# Patient Record
Sex: Male | Born: 1965 | Race: White | Hispanic: No | Marital: Single | Smoking: Current every day smoker
Health system: Southern US, Community
[De-identification: ages and names within clinical notes are randomized; demographics above are authoritative.]

## PROBLEM LIST (undated history)

## (undated) DIAGNOSIS — N209 Urinary calculus, unspecified: Secondary | ICD-10-CM

## (undated) DIAGNOSIS — R053 Chronic cough: Secondary | ICD-10-CM

## (undated) HISTORY — PX: MANDIBLE FRACTURE SURGERY: SHX706

## (undated) HISTORY — PX: HERNIA REPAIR: SHX51

---

## 2004-08-19 ENCOUNTER — Other Ambulatory Visit: Payer: Self-pay

## 2005-06-27 ENCOUNTER — Emergency Department: Payer: Self-pay | Admitting: Emergency Medicine

## 2006-02-14 ENCOUNTER — Emergency Department: Payer: Self-pay | Admitting: Emergency Medicine

## 2006-02-14 ENCOUNTER — Other Ambulatory Visit: Payer: Self-pay

## 2006-08-08 ENCOUNTER — Emergency Department: Payer: Self-pay | Admitting: Emergency Medicine

## 2009-03-03 ENCOUNTER — Emergency Department: Payer: Self-pay | Admitting: Unknown Physician Specialty

## 2009-07-26 ENCOUNTER — Emergency Department: Payer: Self-pay | Admitting: Emergency Medicine

## 2010-09-03 ENCOUNTER — Emergency Department: Payer: Self-pay | Admitting: Emergency Medicine

## 2011-02-03 ENCOUNTER — Emergency Department: Payer: Self-pay | Admitting: Emergency Medicine

## 2011-02-18 ENCOUNTER — Emergency Department: Payer: Self-pay | Admitting: Unknown Physician Specialty

## 2011-03-19 ENCOUNTER — Emergency Department: Payer: Self-pay | Admitting: Internal Medicine

## 2011-03-20 ENCOUNTER — Emergency Department: Payer: Self-pay | Admitting: Unknown Physician Specialty

## 2011-03-29 ENCOUNTER — Inpatient Hospital Stay: Payer: Self-pay | Admitting: Internal Medicine

## 2011-04-08 ENCOUNTER — Emergency Department: Payer: Self-pay | Admitting: Emergency Medicine

## 2011-05-15 ENCOUNTER — Emergency Department: Payer: Self-pay | Admitting: Emergency Medicine

## 2011-05-27 ENCOUNTER — Emergency Department: Payer: Self-pay | Admitting: Emergency Medicine

## 2011-07-25 ENCOUNTER — Emergency Department: Payer: Self-pay | Admitting: Emergency Medicine

## 2011-08-29 ENCOUNTER — Emergency Department: Payer: Self-pay | Admitting: Emergency Medicine

## 2011-10-16 ENCOUNTER — Emergency Department: Payer: Self-pay | Admitting: Emergency Medicine

## 2011-11-27 ENCOUNTER — Emergency Department: Payer: Self-pay | Admitting: Emergency Medicine

## 2012-05-19 IMAGING — CT CT CHEST-ABD-PELV W/ CM
1 of 2 series · 14 of 32 positions shown, 19 images · non-contrast
Comparison: None

REASON FOR EXAM: (1) left chest pain; (2) chest trauma eval for abd
injury  IV  contrast onlyetc
COMMENTS:

PROCEDURE:     CT  - CT CHEST ABDOMEN AND PELVIS W  - March 29, 2011  [DATE]
RESULT:     CT CHEST, ABDOMEN, AND PELVIS
HISTORY: Left chest pain
TECHNIQUE: Multiple axial images obtained from the thoracic inlet to the
pubic symphysis, without p.o. contrast and with 100 ml of Dsovue-JB2
intravenous contrast.

[Series 2: chest, a&p soft tissue · axial · 0.74mm/px · z∈[+691,+1255]mm · 14 of 208 slices shown, 19 images]
[im 10/208  soft-tissue]
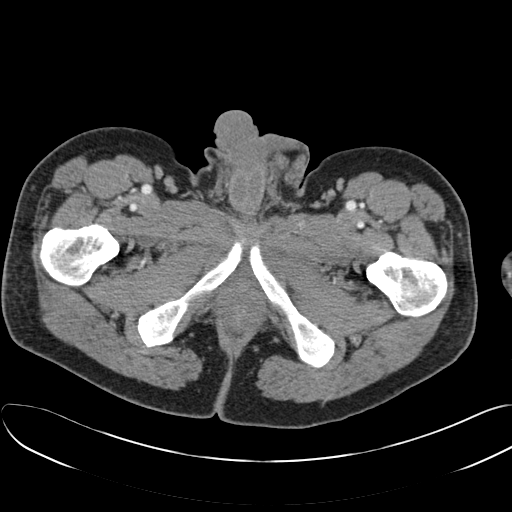
[im 10/208  bone]
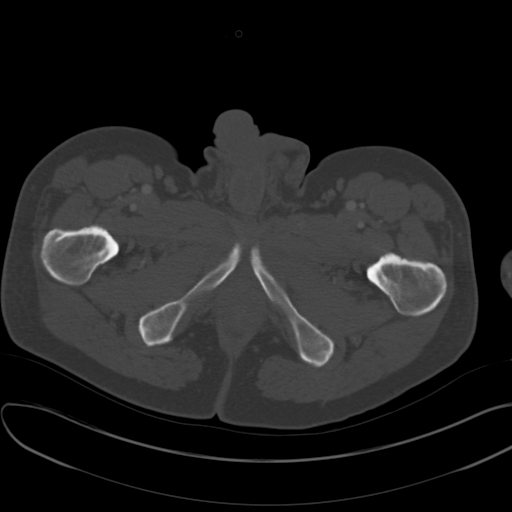
[im 30/208  soft-tissue]
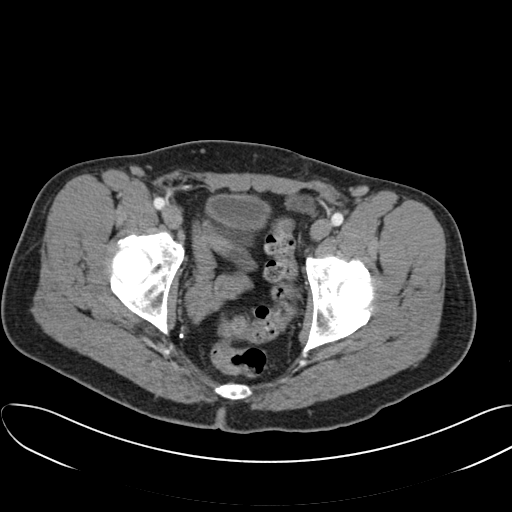
[im 40/208  soft-tissue]
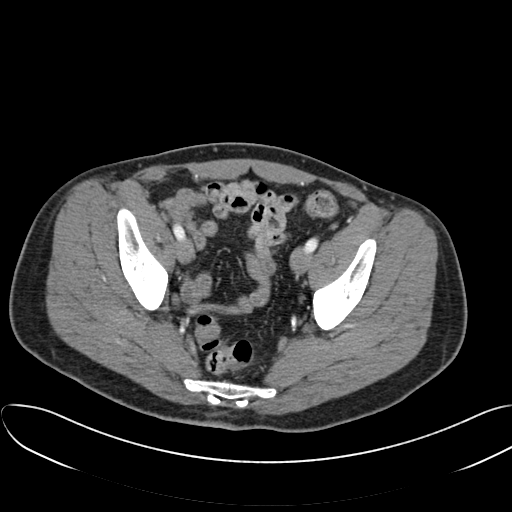
[im 60/208  soft-tissue]
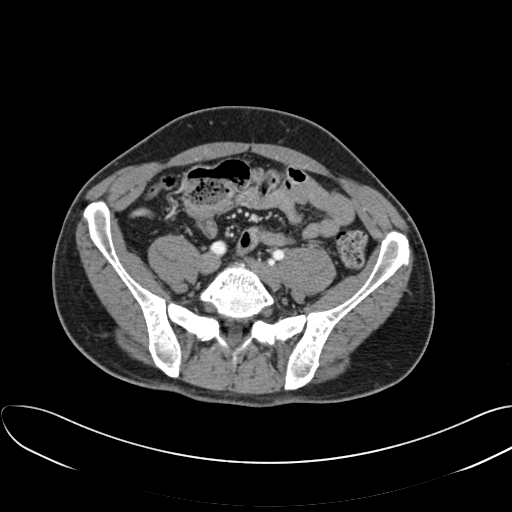
[im 70/208  soft-tissue]
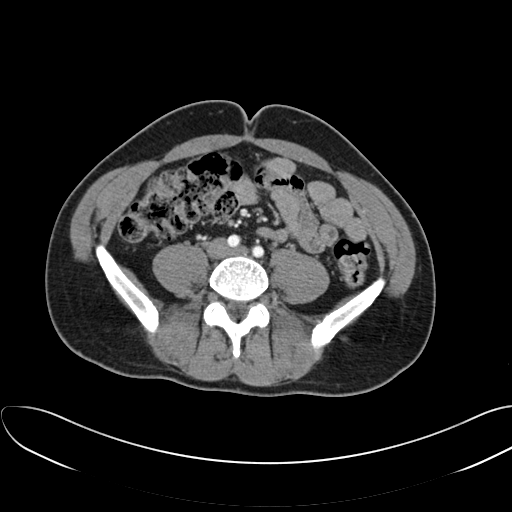
[im 89/208  soft-tissue]
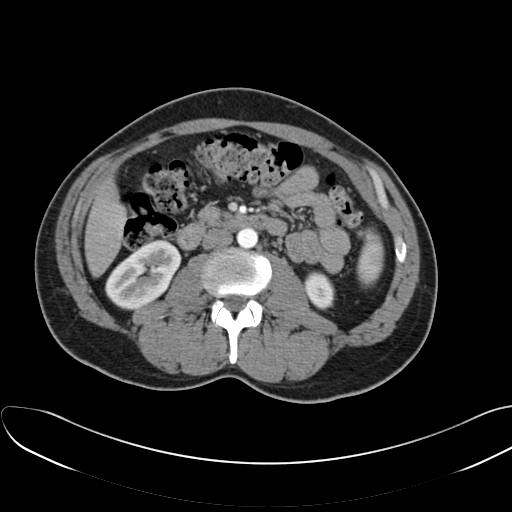
[im 109/208  soft-tissue]
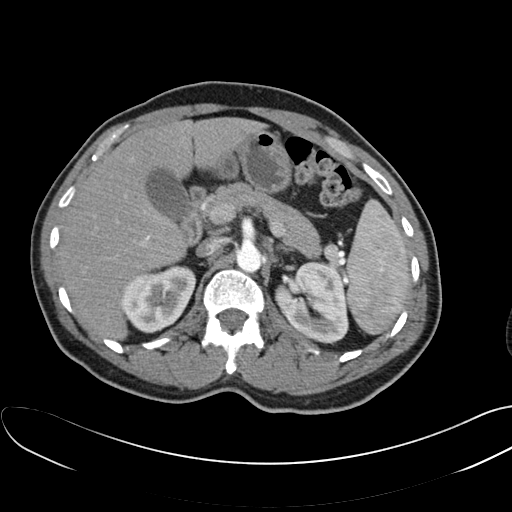
[im 119/208  soft-tissue]
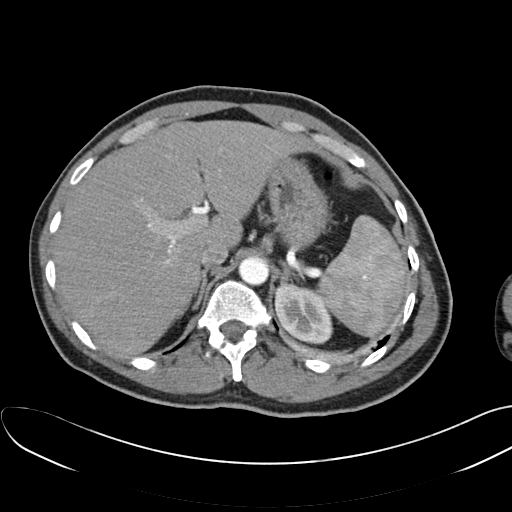
[im 139/208  soft-tissue]
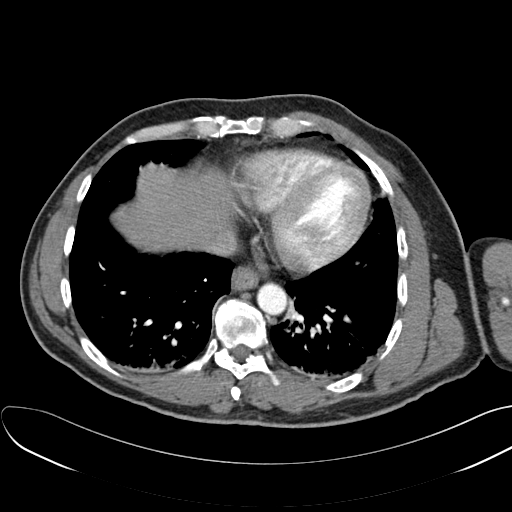
[im 139/208  bone]
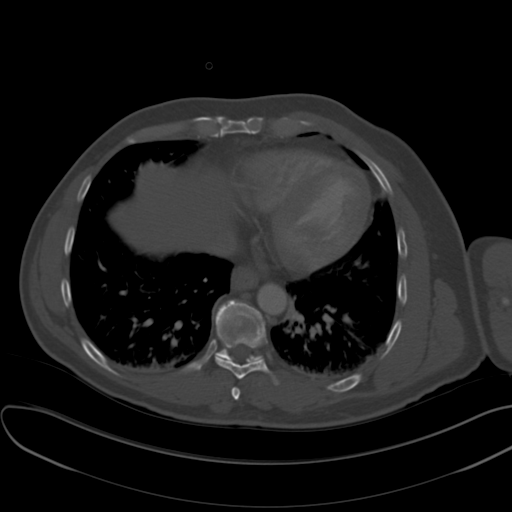
[im 148/208  soft-tissue]
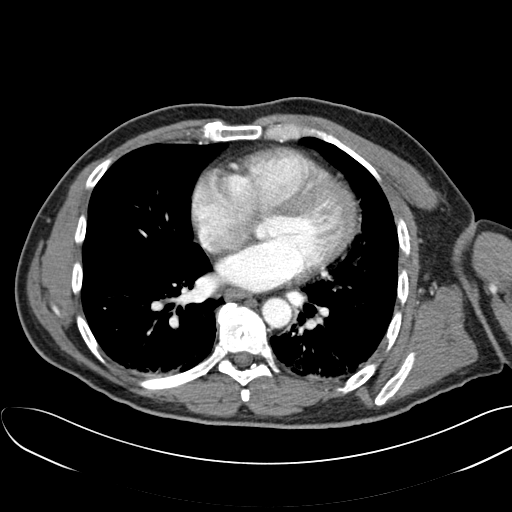
[im 168/208  soft-tissue]
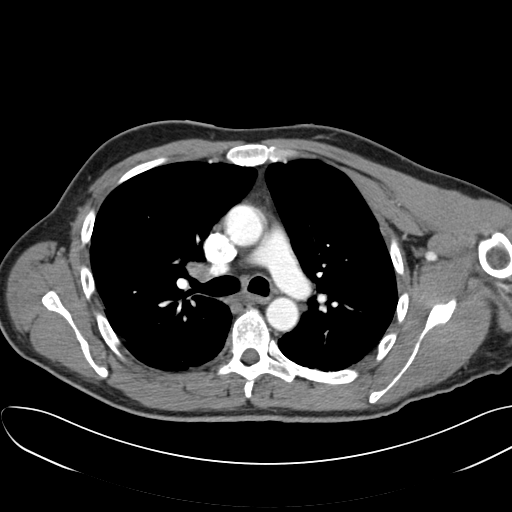
[im 168/208  lung]
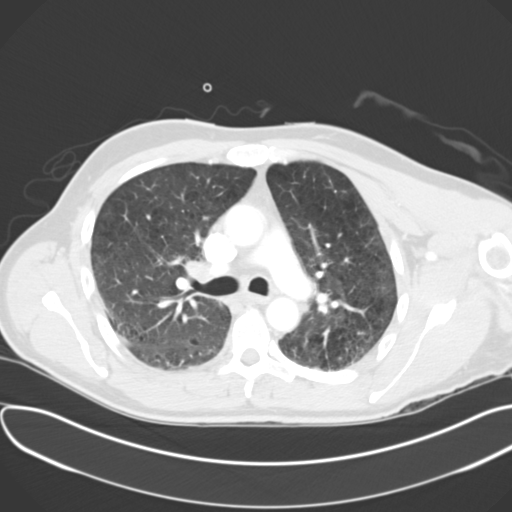
[im 178/208  soft-tissue]
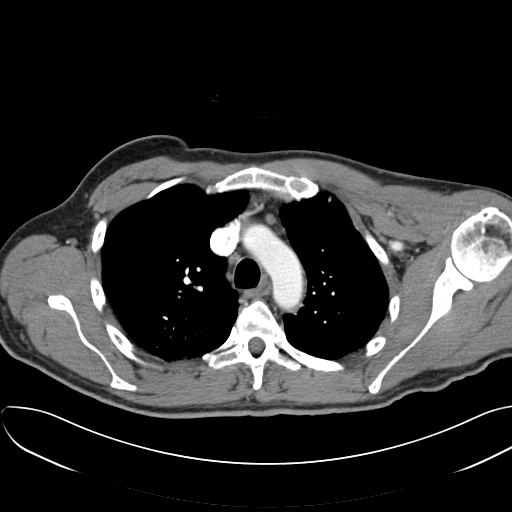
[im 178/208  lung]
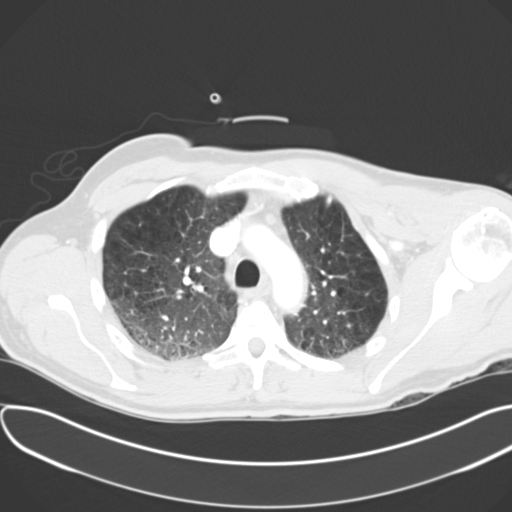
[im 188/208  lung]
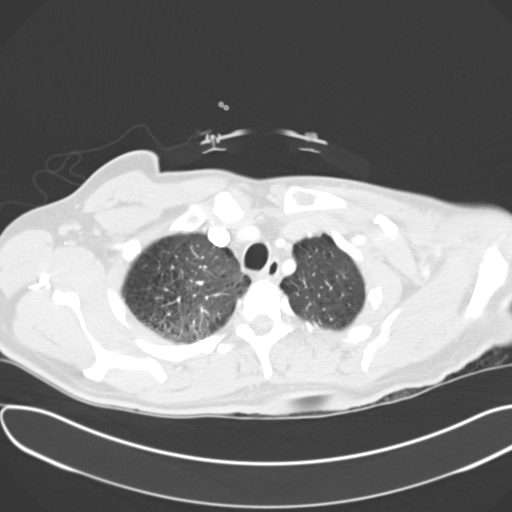
[im 198/208  soft-tissue]
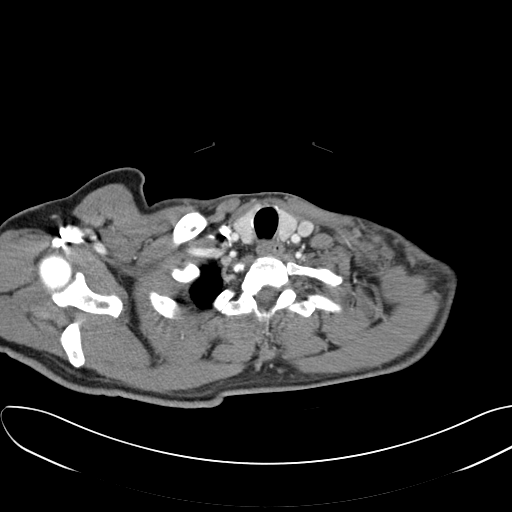
[im 198/208  lung]
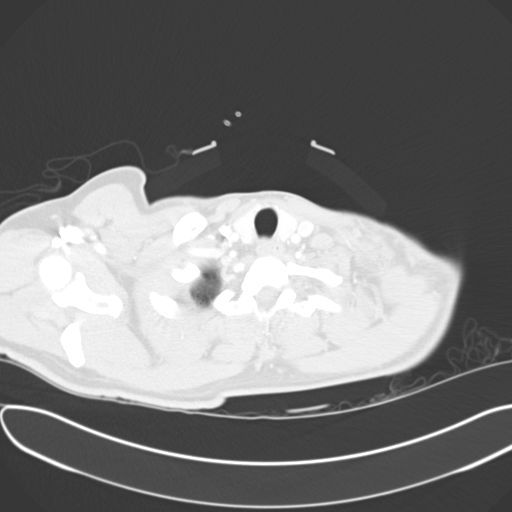

[14 of 32 positions shown; findings below may reference images not displayed]

FINDINGS: CHEST:

There are bilateral emphysematous changes. There is a small left apical
pneumothorax measuring less than 5 present. There is bibasilar atelectasis.
There is no focal parenchymal opacity, pleural effusion, or pneumothorax.

The heart size is normal. There is no pericardial effusion.

There are no pathologically enlarged mediastinal, hilar, or axillary lymph
nodes.

There is a comminuted fracture of the distal left clavicle. There is a
nondisplaced fracture of the lateral left sixth rib.

ABDOMEN/PELVIS:

The liver demonstrates no focal abnormality. There is no intrahepatic or
extrahepatic biliary ductal dilatation. The gallbladder is unremarkable. The
spleen demonstrates no focal abnormality. There is a nonobstructing right
renal calculus. The left kidney, adrenal glands, pancreas are normal. The
bladder is unremarkable.

The opacified stomach, duodenum, small intestine, and large intestine
demonstrate no gross abnormality, but evaluation is limited secondary to
lack of enteric contrast. There is no pneumoperitoneum, pneumatosis, or
portal venous gas. There is no abdominal or pelvic free fluid. There is no
lymphadenopathy.

The abdominal aorta is normal in caliber with atherosclerosis.

The osseous structures are unremarkable.
IMPRESSION: 1. Trace left apical pneumothorax.

2. There is a comminuted fracture of the distal left clavicle.

3. There is a nondisplaced fracture of the lateral left sixth rib.

## 2012-05-19 IMAGING — CT CT CERVICAL SPINE WITHOUT CONTRAST
3 series · 16 of 33 positions shown, 19 images · non-contrast
Comparison: None

REASON FOR EXAM: neck pain
COMMENTS:

PROCEDURE:     CT  - CT CERVICAL SPINE WO  - March 29, 2011 [DATE]
RESULT:     Clinical Indication: Trauma
TECHNIQUE: Multiple axial CT images from the skull base to the mid vertebral
body of T1. obtained with sagittal and coronal reformatted images provided.

[Series 3: axial · axial · 0.33mm/px · z∈[-304,-156]mm · 8 of 89 slices shown, 10 images]
[im 7/89  soft-tissue]
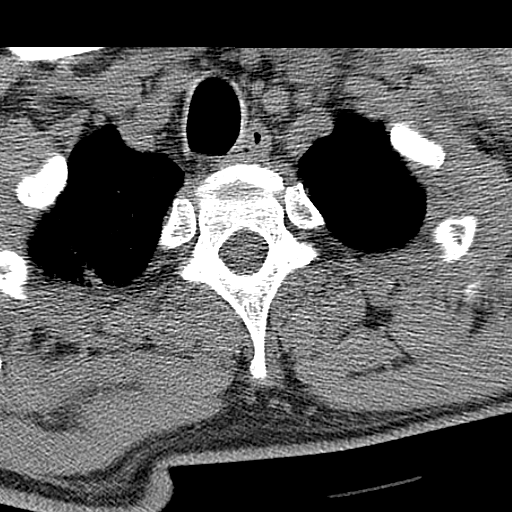
[im 7/89  bone]
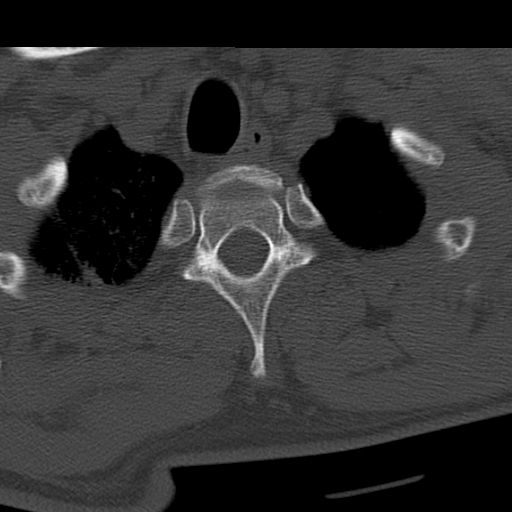
[im 21/89  bone]
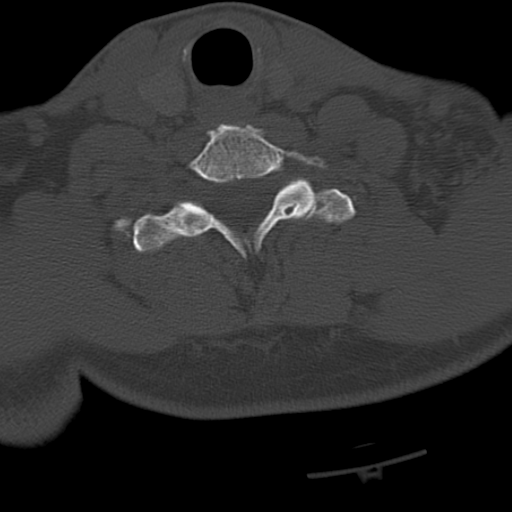
[im 28/89  bone]
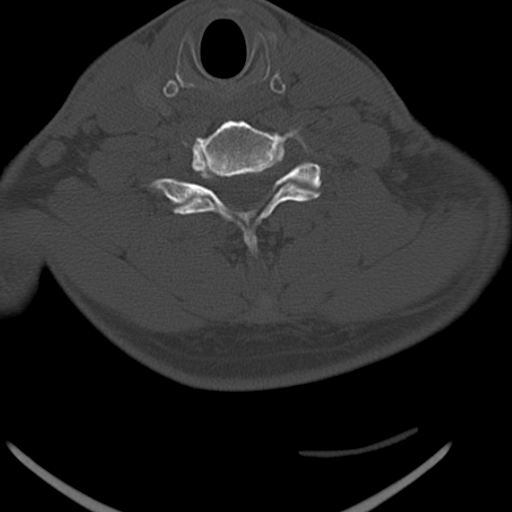
[im 41/89  bone]
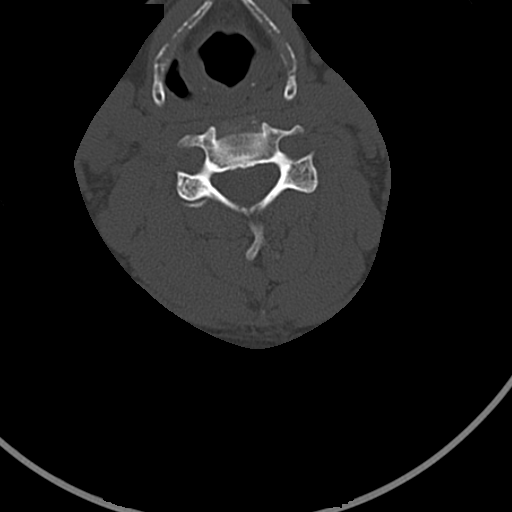
[im 48/89  soft-tissue]
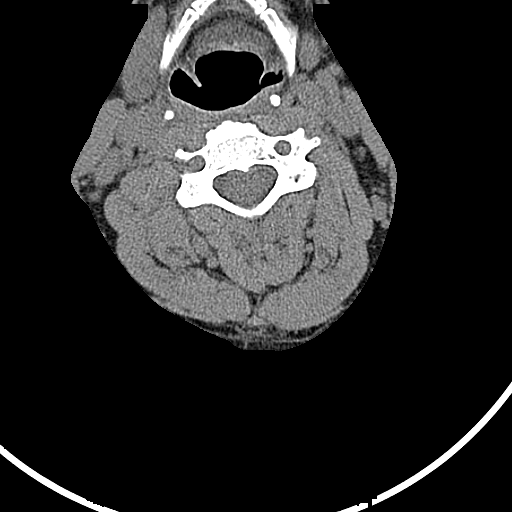
[im 48/89  bone]
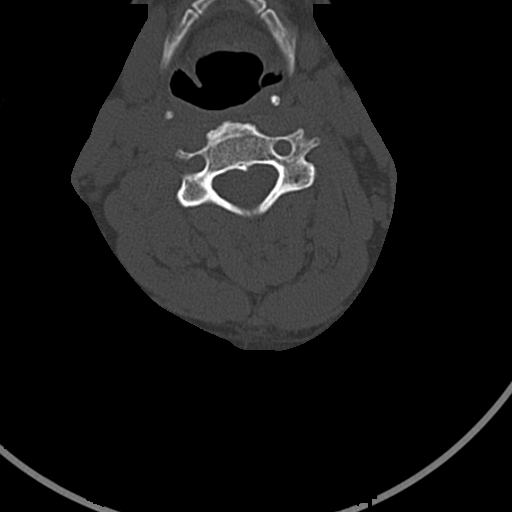
[im 61/89  bone]
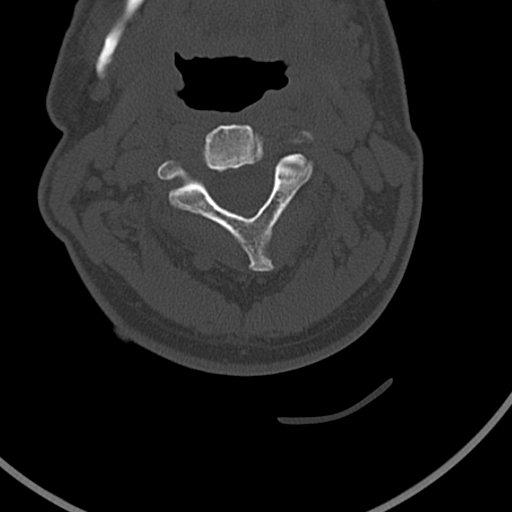
[im 68/89  bone]
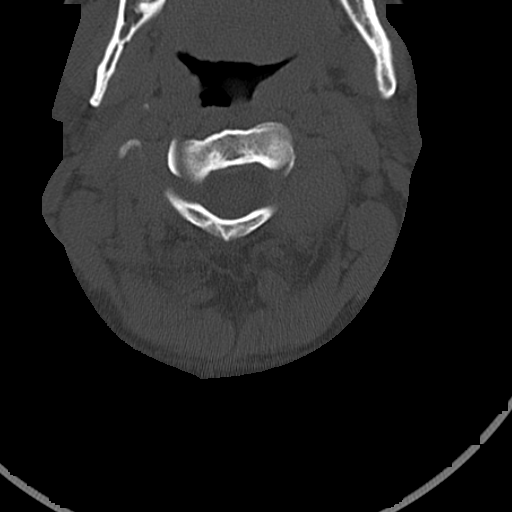
[im 82/89  bone]
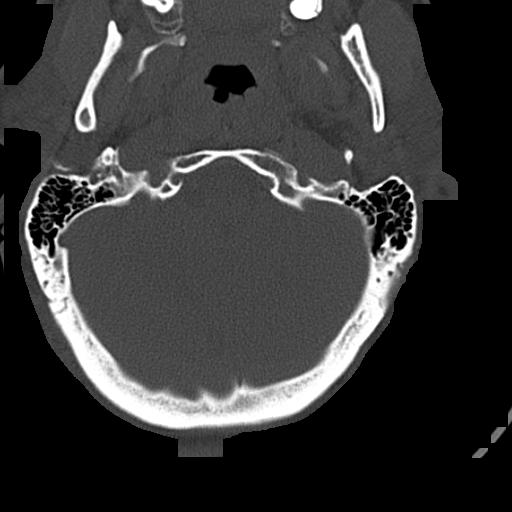

[Series 4: sagittal · sagittal · 0.40mm/px · 5 of 45 slices shown, 6 images]
[im 15/45  bone]
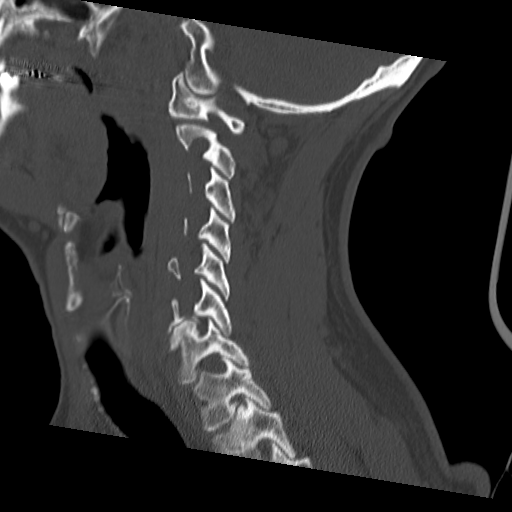
[im 19/45  bone]
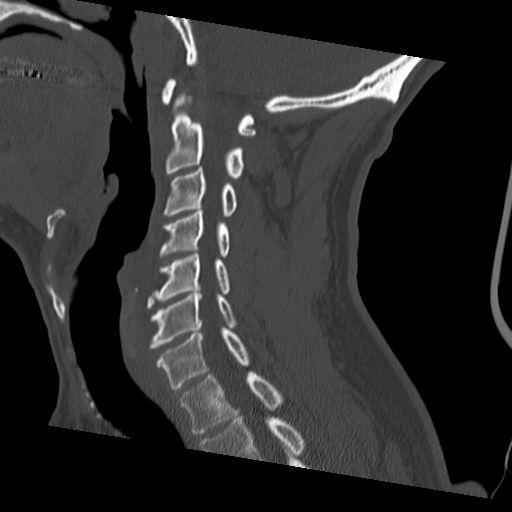
[im 23/45  soft-tissue]
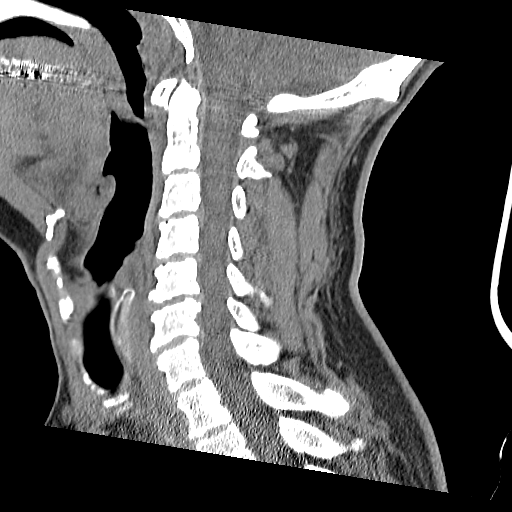
[im 23/45  bone]
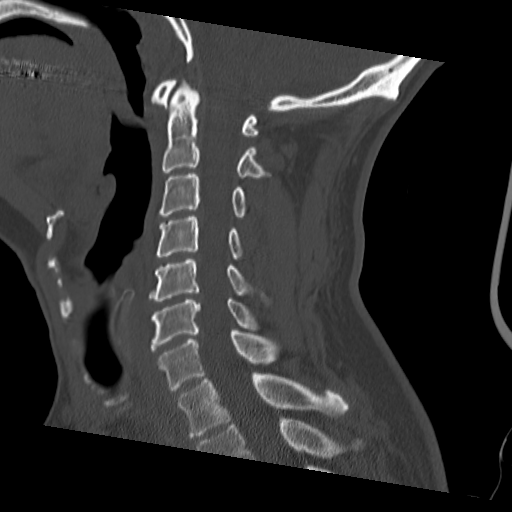
[im 26/45  bone]
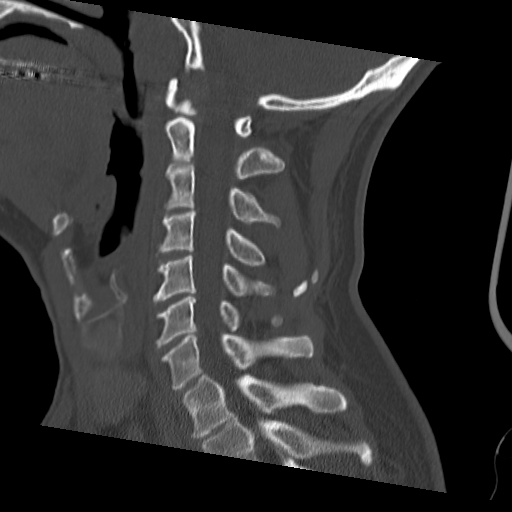
[im 30/45  bone]
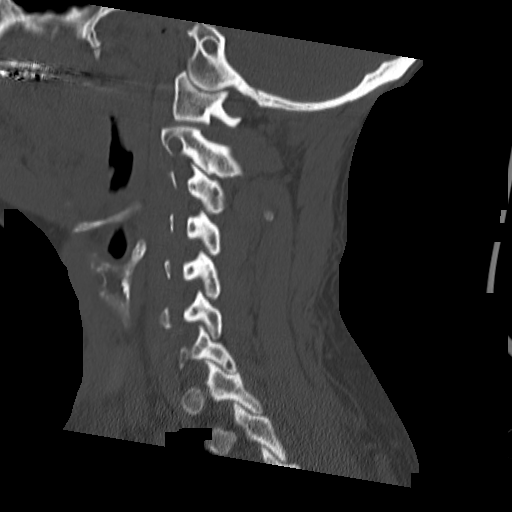

[Series 5: coronal · coronal · 0.40mm/px · 3 of 49 slices shown]
[im 10/49  bone]
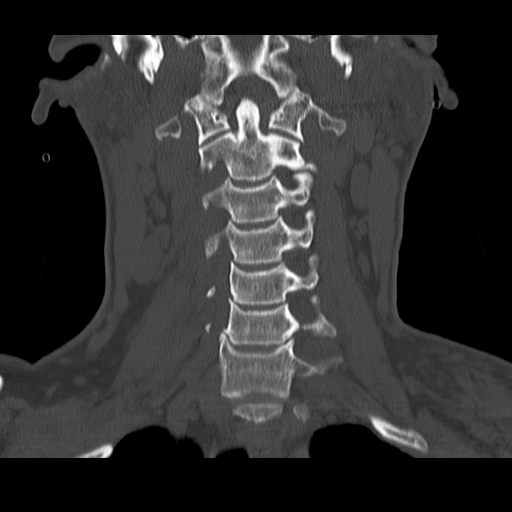
[im 20/49  bone]
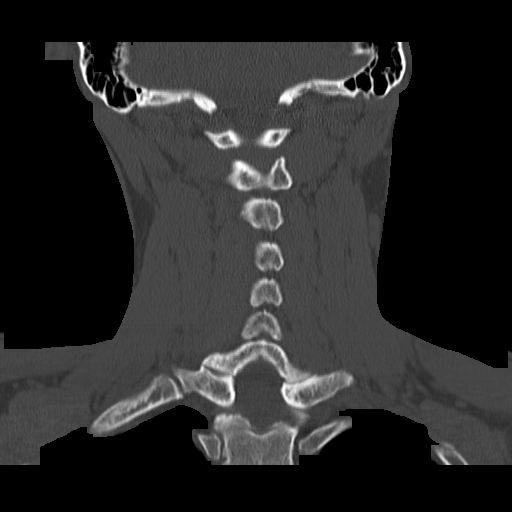
[im 29/49  bone]
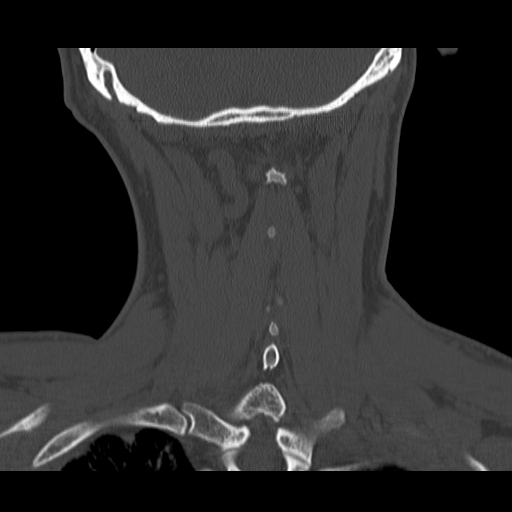

[16 of 33 positions shown; findings below may reference images not displayed]

FINDINGS: The alignment is anatomic. The vertebral body heights are maintained. There
is no acute fracture or static listhesis. The prevertebral soft tissues are
normal. The intraspinal soft tissues are not fully imaged on this
examination due to poor soft tissue contrast, but there is no soft tissue
gross abnormality.

There is mild degenerative disc disease at C5-C6 and C6-C7.

There is a small left apical pneumothorax. Emphysematous change at the right
lung apex.

There is a comminuted fracture of the distal third of the left clavicle.
IMPRESSION: 1. No acute osseous injury of the cervical spine.

2. Ligamentous injury is not evaluated. If there is high clinical concern
for ligamentous injury, consider MRI or flexion/extension radiographs as
clinically indicated and tolerated.

3. Small left apical pneumothorax.

4. Comminuted fracture of the distal third of the left clavicle.

These findings were communicated to Dr. Aromatherapy on 03/29/2011 at 7707 hours .

## 2014-07-09 ENCOUNTER — Emergency Department: Payer: Self-pay | Admitting: Emergency Medicine

## 2014-10-16 ENCOUNTER — Emergency Department: Payer: Self-pay | Admitting: Emergency Medicine

## 2015-09-28 ENCOUNTER — Emergency Department
Admission: EM | Admit: 2015-09-28 | Discharge: 2015-09-28 | Disposition: A | Discharge status: Home / Self Care | Payer: Self-pay | Attending: Emergency Medicine | Admitting: Emergency Medicine

## 2015-09-28 ENCOUNTER — Encounter: Payer: Self-pay | Admitting: Urgent Care

## 2015-09-28 DIAGNOSIS — R109 Unspecified abdominal pain: Secondary | ICD-10-CM | POA: Insufficient documentation

## 2015-09-28 DIAGNOSIS — Z720 Tobacco use: Secondary | ICD-10-CM | POA: Insufficient documentation

## 2015-09-28 HISTORY — DX: Urinary calculus, unspecified: N20.9

## 2015-09-28 LAB — URINALYSIS COMPLETE WITH MICROSCOPIC (ARMC ONLY)
BILIRUBIN URINE: NEGATIVE
Bacteria, UA: NONE SEEN
Glucose, UA: NEGATIVE mg/dL
Hgb urine dipstick: NEGATIVE
Ketones, ur: NEGATIVE mg/dL
Leukocytes, UA: NEGATIVE
Nitrite: NEGATIVE
PH: 5 (ref 5.0–8.0)
PROTEIN: NEGATIVE mg/dL
SQUAMOUS EPITHELIAL / LPF: NONE SEEN
Specific Gravity, Urine: 1.015 (ref 1.005–1.030)

## 2015-09-28 MED ORDER — KETOROLAC TROMETHAMINE 60 MG/2ML IM SOLN
60.0000 mg | Freq: Once | INTRAMUSCULAR | Status: AC
  Administered 2015-09-28: 60 mg via INTRAMUSCULAR
  Filled 2015-09-28: qty 2

## 2015-09-28 MED ORDER — ORPHENADRINE CITRATE 30 MG/ML IJ SOLN
60.0000 mg | Freq: Two times a day (BID) | INTRAMUSCULAR | Status: DC
  Administered 2015-09-28: 60 mg via INTRAMUSCULAR
  Filled 2015-09-28: qty 2

## 2015-09-28 MED ORDER — IBUPROFEN 800 MG PO TABS: 800.0000 mg | ORAL_TABLET | Freq: Three times a day (TID) | ORAL | Status: DC | PRN

## 2015-09-28 MED ORDER — CYCLOBENZAPRINE HCL 10 MG PO TABS: 10.0000 mg | ORAL_TABLET | Freq: Three times a day (TID) | ORAL | Status: DC | PRN

## 2015-09-28 NOTE — ED Notes (Addendum)
Patient presents with RIGHT lower back pain with (+) radiation into flank x 3 days. Denies N/V. (+) urinary frequency reported; no gross hematuria appreciated. Patient requesting that he NOT be given narcotics secondary to a previous addiction issue. Patient in NAD at this time - states, "I am not hurting that bad. I just dont want it to get bad to the point when I am on my knees like I have in the past."

## 2015-09-28 NOTE — Discharge Instructions (Signed)
Flank Pain °Flank pain is pain in your side. The flank is the area of your side between your upper belly (abdomen) and your back. Pain in this area can be caused by many different things. °HOME CARE °Home care and treatment will depend on the cause of your pain. °· Rest as told by your doctor. °· Drink enough fluids to keep your pee (urine) clear or pale yellow.   °· Only take medicine as told by your doctor. °· Tell your doctor about any changes in your pain. °· Follow up with your doctor. °GET HELP RIGHT AWAY IF:  °· Your pain does not get better with medicine.   °· You have new symptoms or your symptoms get worse. °· Your pain gets worse.   °· You have belly (abdominal) pain.   °· You are short of breath.   °· You always feel sick to your stomach (nauseous).   °· You keep throwing up (vomiting).   °· You have puffiness (swelling) in your belly.   °· You feel light-headed or you pass out (faint).   °· You have blood in your pee. °· You have a fever or lasting symptoms for more than 2-3 days. °· You have a fever and your symptoms suddenly get worse. °MAKE SURE YOU:  °· Understand these instructions. °· Will watch your condition. °· Will get help right away if you are not doing well or get worse. °  °This information is not intended to replace advice given to you by your health care provider. Make sure you discuss any questions you have with your health care provider. °  °Document Released: 09/11/2008 Document Revised: 12/24/2014 Document Reviewed: 07/17/2012 °Elsevier Interactive Patient Education ©2016 [employer] ° °

## 2015-09-28 NOTE — ED Provider Notes (Signed)
[facility] Emergency Department Provider Note  ____________________________________________  Time seen: Approximately 10:53 PM  I have reviewed the triage vital signs and the nursing notes.   HISTORY  Chief Complaint Flank Pain    HPI Corey Arnette. is a 49 y.o. male patient complaining of right flank pain for 3 days. Patient denies any nausea vomiting. He denies any urinary complaints. Patient said he has a history of having kidney stones. Patient also asked that no narcotic medications given his secondary to previous addiction issues. Patient denies any radicular component to his flank pain. No palliative measures taken for this complaint. Patient is rating his pain as a 5/10. Describes pain as sharp and achy.   Past Medical History  Diagnosis Date  . Urolithiasis   . Urolithiasis     There are no active problems to display for this patient.   Past Surgical History  Procedure Laterality Date  . Hernia repair      No current outpatient prescriptions on file.  Allergies Review of patient's allergies indicates no known allergies.  No family history on file.  Social History Social History  Substance Use Topics  . Smoking status: Current Every Day Smoker  . Smokeless tobacco: None  . Alcohol Use: No    Review of Systems Constitutional: No fever/chills Eyes: No visual changes. ENT: No sore throat. Cardiovascular: Denies chest pain. Respiratory: Denies shortness of breath. Gastrointestinal: No abdominal pain.  No nausea, no vomiting.  No diarrhea.  No constipation. Genitourinary: Negative for dysuria. Musculoskeletal: Right flank pain Skin: Negative for rash. Neurological: Negative for headaches, focal weakness or numbness. 10-point ROS otherwise negative.  ____________________________________________   PHYSICAL EXAM:  VITAL SIGNS: ED Triage Vitals  Enc Vitals Group     BP 09/28/15 2157 135/75 mmHg     Pulse Rate 09/28/15  2157 85     Resp 09/28/15 2157 18     Temp 09/28/15 2157 98 F (36.7 C)     Temp Source 09/28/15 2157 Oral     SpO2 09/28/15 2157 97 %     Weight 09/28/15 2157 160 lb (72.576 kg)     Height 09/28/15 2157  (1.676 m)     Head Cir --      Peak Flow --      Pain Score 09/28/15 2158 5     Pain Loc --      Pain Edu? --      Excl. in GC? --     Constitutional: Alert and oriented. Well appearing and in no acute distress. Eyes: Conjunctivae are normal. PERRL. EOMI. Head: Atraumatic. Nose: No congestion/rhinnorhea. Mouth/Throat: Mucous membranes are moist.  Oropharynx non-erythematous. Neck: No stridor.  No cervical spine tenderness to palpation. Hematological/Lymphatic/Immunilogical: No cervical lymphadenopathy. Cardiovascular: Normal rate, regular rhythm. Grossly normal heart sounds.  Good peripheral circulation. Respiratory: Normal respiratory effort.  No retractions. Lungs CTAB. Gastrointestinal: Soft and nontender. No distention. No abdominal bruits. No CVA tenderness. Musculoskeletal: No spinal deformity. Decreased range of motion left lateral movements. Right paraspinal muscle spasm with lateral movements. Patient has a negative straight leg test.  Neurologic:  Normal speech and language. No gross focal neurologic deficits are appreciated. No gait instability. Skin:  Skin is warm, dry and intact. No rash noted. Psychiatric: Mood and affect are normal. Speech and behavior are normal.  ____________________________________________   LABS (all labs ordered are listed, but only abnormal results are displayed)  Labs Reviewed  URINALYSIS COMPLETEWITH MICROSCOPIC ([facility] ONLY) - Abnormal; Notable  for the following:    Color, Urine YELLOW (*)    APPearance CLEAR (*)    All other components within normal limits    ____________________________________________  EKG   ____________________________________________  RADIOLOGY   ____________________________________________   PROCEDURES  Procedure(s) performed: None  Critical Care performed: No  ____________________________________________   INITIAL IMPRESSION / ASSESSMENT AND PLAN / ED COURSE  Pertinent labs & imaging results that were available during my care of the patient were reviewed by me and considered in my medical decision making (see chart for details).  Right flank pain. Patient given an injection of Toradol and Norflex. Patient discharged with prescription for Flexeril and ibuprofen. [facility] advised [facility] this condition persists. Patient advised return by ER for condition worsens. ____________________________________________   FINAL CLINICAL IMPRESSION(S) / ED DIAGNOSES  Final diagnoses:  Right flank pain      Joni ReiningRonald K Dayson Aboud, PA-C 09/28/15 2259  Myrna Blazeravid Matthew Schaevitz, MD 09/28/15 2314

## 2016-07-02 ENCOUNTER — Emergency Department
Admission: EM | Admit: 2016-07-02 | Discharge: 2016-07-02 | Disposition: A | Discharge status: Home / Self Care | Payer: Self-pay | Attending: Emergency Medicine | Admitting: Emergency Medicine

## 2016-07-02 ENCOUNTER — Encounter: Payer: Self-pay | Admitting: Emergency Medicine

## 2016-07-02 DIAGNOSIS — M545 Low back pain: Secondary | ICD-10-CM | POA: Insufficient documentation

## 2016-07-02 DIAGNOSIS — F17200 Nicotine dependence, unspecified, uncomplicated: Secondary | ICD-10-CM | POA: Insufficient documentation

## 2016-07-02 LAB — URINALYSIS COMPLETE WITH MICROSCOPIC (ARMC ONLY)
Bacteria, UA: NONE SEEN
Bilirubin Urine: NEGATIVE
GLUCOSE, UA: NEGATIVE mg/dL
Hgb urine dipstick: NEGATIVE
KETONES UR: NEGATIVE mg/dL
Leukocytes, UA: NEGATIVE
NITRITE: NEGATIVE
PROTEIN: NEGATIVE mg/dL
Specific Gravity, Urine: 1.021 (ref 1.005–1.030)
pH: 5 (ref 5.0–8.0)

## 2016-07-02 MED ORDER — POLYETHYLENE GLYCOL 3350 17 G PO PACK: 17.0000 g | PACK | Freq: Every day | ORAL | Status: DC

## 2016-07-02 MED ORDER — PREDNISONE 20 MG PO TABS: 40.0000 mg | ORAL_TABLET | Freq: Every day | ORAL | Status: DC

## 2016-07-02 MED ORDER — PREDNISONE 20 MG PO TABS
60.0000 mg | ORAL_TABLET | Freq: Once | ORAL | Status: AC
  Administered 2016-07-02: 60 mg via ORAL

## 2016-07-02 MED ORDER — PREDNISONE 20 MG PO TABS
ORAL_TABLET | ORAL | Status: AC
  Administered 2016-07-02: 60 mg via ORAL
  Filled 2016-07-02: qty 3

## 2016-07-02 NOTE — ED Notes (Addendum)
Pt reports lower back pain since Saturday, denies known injury. Pt ambulatory to triage with no difficulty or distress. Pt also reports 3 days of "shooting" rectal pain before lower back started.

## 2016-07-02 NOTE — Discharge Instructions (Signed)
Please seek medical attention for any high fevers, chest pain, shortness of breath, change in behavior, persistent vomiting, bloody stool or any other new or concerning symptoms. ° °Back Pain, Adult °Back pain is very common. The pain often gets better over time. The cause of back pain is usually not dangerous. Most people can learn to manage their back pain on their own.  °HOME CARE  °Watch your back pain for any changes. The following actions may help to lessen any pain you are feeling: °· Stay active. Start with short walks on flat ground if you can. Try to walk farther each day. °· Exercise regularly as told by your doctor. Exercise helps your back heal faster. It also helps avoid future injury by keeping your muscles strong and flexible. °· Do not sit, drive, or stand in one place for more than 30 minutes. °· Do not stay in bed. Resting more than 1-2 days can slow down your recovery. °· Be careful when you bend or lift an object. Use good form when lifting: °¨ Bend at your knees. °¨ Keep the object close to your body. °¨ Do not twist. °· Sleep on a firm mattress. Lie on your side, and bend your knees. If you lie on your back, put a pillow under your knees. °· Take medicines only as told by your doctor. °· Put ice on the injured area. °¨ Put ice in a plastic bag. °¨ Place a towel between your skin and the bag. °¨ Leave the ice on for 20 minutes, 2-3 times a day for the first 2-3 days. After that, you can switch between ice and heat packs. °· Avoid feeling anxious or stressed. Find good ways to deal with stress, such as exercise. °· Maintain a healthy weight. Extra weight puts stress on your back. °GET HELP IF:  °· You have pain that does not go away with rest or medicine. °· You have worsening pain that goes down into your legs or buttocks. °· You have pain that does not get better in one week. °· You have pain at night. °· You lose weight. °· You have a fever or chills. °GET HELP RIGHT AWAY IF:  °· You cannot  control when you poop (bowel movement) or pee (urinate). °· Your arms or legs feel weak. °· Your arms or legs lose feeling (numbness). °· You feel sick to your stomach (nauseous) or throw up (vomit). °· You have belly (abdominal) pain. °· You feel like you may pass out (faint). °  °This information is not intended to replace advice given to you by your health care provider. Make sure you discuss any questions you have with your health care provider. °  °Document Released: 05/21/2008 Document Revised: 12/24/2014 Document Reviewed: 04/06/2014 °Elsevier Interactive Patient Education ©2016 [employer] Inc. ° °

## 2016-07-02 NOTE — ED Provider Notes (Signed)
[facility] Emergency Department Provider Note    ____________________________________________  Time seen: ~1900  I have reviewed the triage vital signs and the nursing notes.   HISTORY  Chief Complaint Back Pain   History limited by: Not Limited   HPI Corey House. is a 50 y.o. male who presents to the emergency department today because of concerns for low back pain. It started 2 days ago. Has been fairly constant since then. Patient does say that he does a lot of lifting at work although does not recall any specific injury where he hurt his back at work. Initially he had some sharp rectal pain but now is been more lower back pain. It does hurt with movement. The patient denies any fevers. Denies any difficulty with urination or defecation. Denies IV drug use.   Past Medical History  Diagnosis Date  . Urolithiasis   . Urolithiasis     There are no active problems to display for this patient.   Past Surgical History  Procedure Laterality Date  . Hernia repair      Current Outpatient Rx  Name  Route  Sig  Dispense  Refill  . cyclobenzaprine (FLEXERIL) 10 MG tablet   Oral   Take 1 tablet (10 mg total) by mouth every 8 (eight) hours as needed for muscle spasms.   15 tablet   0   . ibuprofen (ADVIL,MOTRIN) 800 MG tablet   Oral   Take 1 tablet (800 mg total) by mouth every 8 (eight) hours as needed for moderate pain.   15 tablet   0     Allergies Review of patient's allergies indicates no known allergies.  No family history on file.  Social History Social History  Substance Use Topics  . Smoking status: Current Every Day Smoker  . Smokeless tobacco: None  . Alcohol Use: No    Review of Systems  Constitutional: Negative for fever. Cardiovascular: Negative for chest pain. Respiratory: Negative for shortness of breath. Gastrointestinal: Negative for abdominal pain, vomiting and diarrhea. Genitourinary: Negative for  dysuria. Musculoskeletal: Positive for low back pain Skin: Negative for rash. Neurological: Negative for headaches, focal weakness or numbness.   10-point ROS otherwise negative.  ____________________________________________   PHYSICAL EXAM:  VITAL SIGNS: ED Triage Vitals  Enc Vitals Group     BP 07/02/16 1803 144/104 mmHg     Pulse Rate 07/02/16 1803 71     Resp 07/02/16 1803 16     Temp 07/02/16 1803 97.9 F (36.6 C)     Temp Source 07/02/16 1803 Oral     SpO2 07/02/16 1803 98 %     Weight 07/02/16 1803 160 lb (72.576 kg)     Height 07/02/16 1803  (1.727 m)     Head Cir --      Peak Flow --      Pain Score 07/02/16 1804 0   Constitutional: Alert and oriented. Well appearing and in no distress. Eyes: Conjunctivae are normal. PERRL. Normal extraocular movements. ENT   Head: Normocephalic and atraumatic.   Nose: No congestion/rhinnorhea.   Mouth/Throat: Mucous membranes are moist.   Neck: No stridor. Hematological/Lymphatic/Immunilogical: No cervical lymphadenopathy. Cardiovascular: Normal rate, regular rhythm.  No murmurs, rubs, or gallops. Respiratory: Normal respiratory effort without tachypnea nor retractions. Breath sounds are clear and equal bilaterally. No wheezes/rales/rhonchi. Gastrointestinal: Soft and nontender. No distention.  Genitourinary: Deferred Musculoskeletal: Normal range of motion in all extremities. No joint effusions. No lower back tenderness. Pain was  elicited with straight leg test. Neurologic:  Normal speech and language. No gross focal neurologic deficits are appreciated.  Skin:  Skin is warm, dry and intact. No rash noted. Psychiatric: Mood and affect are normal. Speech and behavior are normal. Patient exhibits appropriate insight and judgment.  ____________________________________________    LABS (pertinent positives/negatives)  Labs Reviewed  URINALYSIS COMPLETEWITH MICROSCOPIC ([facility] ONLY) - Abnormal; Notable for the  following:    Color, Urine YELLOW (*)    APPearance CLEAR (*)    Squamous Epithelial / LPF 0-5 (*)    All other components within normal limits     ____________________________________________   EKG  None  ____________________________________________    RADIOLOGY  None  ____________________________________________   PROCEDURES  Procedure(s) performed: None  Critical Care performed: No  ____________________________________________   INITIAL IMPRESSION / ASSESSMENT AND PLAN / ED COURSE  Pertinent labs & imaging results that were available during my care of the patient were reviewed by me and considered in my medical decision making (see chart for details).  Patient presents to the emergency department today because of concerns for low back pain. No urinary or defecation complaints. No fevers. No IV drug use. No weakness in his legs. At this point I doubt central cord lesion. I think it is possible patient has herniated disc. Especially given the patient history of lifting at work. Will plan on discharging patient. 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 Will give residential treatment services information.  ____________________________________________   FINAL CLINICAL IMPRESSION(S) / ED DIAGNOSES  Final diagnoses:  Bilateral low back pain, with sciatica presence unspecified     Note: This dictation was prepared with Dragon dictation. Any transcriptional errors that result from this process are unintentional    Phineas SemenGraydon Vickee Mormino, MD 07/02/16 2006

## 2017-01-21 ENCOUNTER — Emergency Department
Admission: EM | Admit: 2017-01-21 | Discharge: 2017-01-21 | Disposition: A | Discharge status: Home / Self Care | Payer: Worker's Compensation | Attending: Emergency Medicine | Admitting: Emergency Medicine

## 2017-01-21 DIAGNOSIS — Y929 Unspecified place or not applicable: Secondary | ICD-10-CM | POA: Diagnosis not present

## 2017-01-21 DIAGNOSIS — Z23 Encounter for immunization: Secondary | ICD-10-CM | POA: Insufficient documentation

## 2017-01-21 DIAGNOSIS — S51812A Laceration without foreign body of left forearm, initial encounter: Secondary | ICD-10-CM | POA: Diagnosis present

## 2017-01-21 DIAGNOSIS — W268XXA Contact with other sharp object(s), not elsewhere classified, initial encounter: Secondary | ICD-10-CM | POA: Diagnosis not present

## 2017-01-21 DIAGNOSIS — Y9389 Activity, other specified: Secondary | ICD-10-CM | POA: Diagnosis not present

## 2017-01-21 DIAGNOSIS — Y990 Civilian activity done for income or pay: Secondary | ICD-10-CM | POA: Diagnosis not present

## 2017-01-21 DIAGNOSIS — S41112A Laceration without foreign body of left upper arm, initial encounter: Secondary | ICD-10-CM

## 2017-01-21 DIAGNOSIS — F17210 Nicotine dependence, cigarettes, uncomplicated: Secondary | ICD-10-CM | POA: Insufficient documentation

## 2017-01-21 MED ORDER — TETANUS-DIPHTH-ACELL PERTUSSIS 5-2.5-18.5 LF-MCG/0.5 IM SUSP
0.5000 mL | Freq: Once | INTRAMUSCULAR | Status: AC
  Administered 2017-01-21: 0.5 mL via INTRAMUSCULAR
  Filled 2017-01-21: qty 0.5

## 2017-01-21 NOTE — ED Provider Notes (Signed)
[facility] Emergency Department Provider Note  ____________________________________________  Time seen: Approximately 5:47 PM  I have reviewed the triage vital signs and the nursing notes.   HISTORY  Chief Complaint Laceration    HPI Corey House. is a 51 y.o. male that presents to the emergency department with left arm laceration. Patient was at work and he began to fall after dropping a wrench and grabbed a hold of a machine which caused the laceration. Patient denies any additional injuries. No head trauma or loss of consciousness. Patient has full range of motion in fingers and no numbness or tingling. Patient does not remember last tetanus shot.   Past Medical History:  Diagnosis Date  . Urolithiasis   . Urolithiasis     There are no active problems to display for this patient.   Past Surgical History:  Procedure Laterality Date  . HERNIA REPAIR      Prior to Admission medications   Medication Sig Start Date End Date Taking? Authorizing Provider  cyclobenzaprine (FLEXERIL) 10 MG tablet Take 1 tablet (10 mg total) by mouth every 8 (eight) hours as needed for muscle spasms. 09/28/15   Joni Reining, PA-C  ibuprofen (ADVIL,MOTRIN) 800 MG tablet Take 1 tablet (800 mg total) by mouth every 8 (eight) hours as needed for moderate pain. 09/28/15   Joni Reining, PA-C  polyethylene glycol [facility] / GLYCOLAX) packet Take 17 g by mouth daily. 07/02/16   Phineas Semen, MD  predniSONE (DELTASONE) 20 MG tablet Take 2 tablets (40 mg total) by mouth daily. 07/02/16   Phineas Semen, MD    Allergies Patient has no known allergies.  No family history on file.  Social History Social History  Substance Use Topics  . Smoking status: Current Every Day Smoker    Packs/day: 1.00    Types: Cigarettes  . Smokeless tobacco: Never Used  . Alcohol use No     Review of Systems  Constitutional: No fever/chills ENT: No upper respiratory  complaints. Cardiovascular: No chest pain. Respiratory: No cough. No SOB. Gastrointestinal: No abdominal pain.  No nausea, no vomiting.  Skin: Negative for rash, ecchymosis. Neurological: Negative for headaches, numbness or tingling   ____________________________________________   PHYSICAL EXAM:  VITAL SIGNS: ED Triage Vitals  Enc Vitals Group     BP 01/21/17 1700 121/75     Pulse Rate 01/21/17 1700 78     Resp 01/21/17 1700 15     Temp 01/21/17 1700 98.2 F (36.8 C)     Temp Source 01/21/17 1700 Oral     SpO2 01/21/17 1700 95 %     Weight 01/21/17 1701 160 lb (72.6 kg)     Height 01/21/17 1701 5\' 4"  (1.626 m)     Head Circumference --      Peak Flow --      Pain Score 01/21/17 1701 0     Pain Loc --      Pain Edu? --      Excl. in GC? --      Constitutional: Alert and oriented. Well appearing and in no acute distress. Eyes: Conjunctivae are normal. PERRL. EOMI. Head: Atraumatic. ENT:      Ears:      Nose: No congestion/rhinnorhea.      Mouth/Throat: Mucous membranes are moist.  Neck: No stridor. Cardiovascular: Normal rate, regular rhythm.  Good peripheral circulation. Respiratory: Normal respiratory effort without tachypnea or retractions. Lungs CTAB. Good air entry to the bases with no decreased  or absent breath sounds. Musculoskeletal: Full range of motion to all extremities. No gross deformities appreciated. Neurologic:  Normal speech and language. No gross focal neurologic deficits are appreciated.  Skin:  Skin is warm, dry. 2 centimeter laceration over left forearm. Scratches extend out longer than the open laceration. Psychiatric: Mood and affect are normal. Speech and behavior are normal. Patient exhibits appropriate insight and judgement.   ____________________________________________   LABS (all labs ordered are listed, but only abnormal results are displayed)  Labs Reviewed - No data to  display ____________________________________________  EKG   ____________________________________________  RADIOLOGY  No results found.  ____________________________________________    PROCEDURES  Procedure(s) performed:    Procedures  LACERATION REPAIR Performed by: Enid DerryAshley Azion Centrella  Consent: Verbal consent obtained.  Consent given by: patient  Prepped and Draped in normal sterile fashion  Wound explored: No foreign bodies   Laceration Location: Left forearm  Laceration Length: 2 cm  Anesthesia: None  Local anesthetic: lidocaine 1% without epinephrine  Anesthetic total: 5 ml  Irrigation method: syringe  Amount of cleaning: 500ml normal saline  Skin closure: 4-0 nylon  Number of sutures: 6   Technique: Simple interrupted  Patient tolerance: Patient tolerated the procedure well with no immediate complications.  Medications - No data to display   ____________________________________________   INITIAL IMPRESSION / ASSESSMENT AND PLAN / ED COURSE  Pertinent labs & imaging results that were available during my care of the patient were reviewed by me and considered in my medical decision making (see chart for details).  Review of the [place] CSRS was performed in accordance of the [facility] prior to dispensing any controlled drugs.     Patient's diagnosis is consistent with laceration. Vital signs and exam are reassuring. Laceration was cleaned and repaired in ED. Tetanus shot was updated. Patient is to follow up with PCP as directed. Patient is given ED precautions to return to the ED for any worsening or new symptoms.     ____________________________________________  FINAL CLINICAL IMPRESSION(S) / ED DIAGNOSES  Final diagnoses:  None      NEW MEDICATIONS STARTED DURING THIS VISIT:  New Prescriptions   No medications on file        This chart was dictated using voice recognition software/Dragon. Despite best efforts to proofread, errors can  occur which can change the meaning. Any change was purely unintentional.    Enid DerryAshley Sascha Palma, PA-C 01/21/17 [id number]    Arnaldo NatalPaul F Malinda, MD 01/21/17 [phone]

## 2017-01-21 NOTE — ED Triage Notes (Signed)
Pt reports that he cut left lower arm while at work - he had a wrench to slip out of his hand and started to fall and he grabbed hold to the machine and caused lacreation

## 2017-01-21 NOTE — ED Notes (Signed)
See triage note  Laceration noted to left forearm  Provider in room on arrival

## 2017-01-30 ENCOUNTER — Encounter: Payer: Self-pay | Admitting: Emergency Medicine

## 2017-01-30 ENCOUNTER — Emergency Department
Admission: EM | Admit: 2017-01-30 | Discharge: 2017-01-30 | Disposition: A | Discharge status: Home / Self Care | Payer: Self-pay | Attending: Emergency Medicine | Admitting: Emergency Medicine

## 2017-01-30 DIAGNOSIS — F17210 Nicotine dependence, cigarettes, uncomplicated: Secondary | ICD-10-CM | POA: Insufficient documentation

## 2017-01-30 DIAGNOSIS — K0381 Cracked tooth: Secondary | ICD-10-CM | POA: Insufficient documentation

## 2017-01-30 DIAGNOSIS — S025XXB Fracture of tooth (traumatic), initial encounter for open fracture: Secondary | ICD-10-CM

## 2017-01-30 DIAGNOSIS — K047 Periapical abscess without sinus: Secondary | ICD-10-CM

## 2017-01-30 DIAGNOSIS — K029 Dental caries, unspecified: Secondary | ICD-10-CM | POA: Insufficient documentation

## 2017-01-30 MED ORDER — LIDOCAINE VISCOUS 2 % MT SOLN: 10.0000 mL | OROMUCOSAL | 0 refills | Status: DC | PRN

## 2017-01-30 MED ORDER — AMOXICILLIN 500 MG PO TABS: 500.0000 mg | ORAL_TABLET | Freq: Three times a day (TID) | ORAL | 0 refills | Status: DC

## 2017-01-30 MED ORDER — ETODOLAC 400 MG PO TABS: 400.0000 mg | ORAL_TABLET | Freq: Two times a day (BID) | ORAL | 0 refills | Status: AC

## 2017-01-30 NOTE — ED Provider Notes (Signed)
[facility] Emergency Department Provider Note  ____________________________________________  Time seen: Approximately 7:26 AM  I have reviewed the triage vital signs and the nursing notes.   HISTORY  Chief Complaint Dental Pain    HPI Corey SchoonerKenneth W Rousseau Jr. is a [age] y.o. male , NAD, presents to the emergency department for evaluation of dental pain. Patient states he broke a right lower tooth over 6 months ago. Over the last 2 days as increasing pain and swelling of the area. States pain seems to be worse at night. Denies any swelling about the jawline or the cheek. Has not noted any oozing or weeping from the area. Has had no fevers, chills or body aches. Denies any swelling about the lips/tongue/throat. Has had no difficulty eating or swallowing. Notes that he will have dental insurance within about 4 weeks in plans to establish care with a dentist at that time.   Past Medical History:  Diagnosis Date  . Urolithiasis   . Urolithiasis     There are no active problems to display for this patient.   Past Surgical History:  Procedure Laterality Date  . HERNIA REPAIR      Prior to Admission medications   Medication Sig Start Date End Date Taking? Authorizing Provider  amoxicillin (AMOXIL) 500 MG tablet Take 1 tablet (500 mg total) by mouth 3 (three) times daily with meals. 01/30/17   Jami L Hagler, PA-C  etodolac (LODINE) 400 MG tablet Take 1 tablet (400 mg total) by mouth 2 (two) times daily. 01/30/17 02/06/17  Jami L Hagler, PA-C  lidocaine (XYLOCAINE) 2 % solution Use as directed 10 mLs in the mouth or throat every 4 (four) hours as needed for mouth pain. 01/30/17   Jami L Hagler, PA-C    Allergies Patient has no known allergies.  No family history on file.  Social History Social History  Substance Use Topics  . Smoking status: Current Every Day Smoker    Packs/day: 1.00    Types: Cigarettes  . Smokeless tobacco: Never Used  . Alcohol use No      Review of Systems  Constitutional: No fever/chills ENT: Positive dental pain. No Swelling about the lips/tongue/throat. Musculoskeletal: Negative for Jaw pain.  Skin: Negative for rash, Redness, swelling, oozing, weeping  ____________________________________________   PHYSICAL EXAM:  VITAL SIGNS: ED Triage Vitals  Enc Vitals Group     BP 01/30/17 0648 134/88     Pulse Rate 01/30/17 0648 71     Resp 01/30/17 0648 18     Temp 01/30/17 0648 97.7 F (36.5 C)     Temp Source 01/30/17 0648 Oral     SpO2 01/30/17 0648 99 %     Weight 01/30/17 0646 160 lb (72.6 kg)     Height 01/30/17 0646 5\' 4"  (1.626 m)     Head Circumference --      Peak Flow --      Pain Score 01/30/17 0646 8     Pain Loc --      Pain Edu? --      Excl. in GC? --      Constitutional: Alert and oriented. Well appearing and in no acute distress. Eyes: Conjunctivae are normal.  Head: Atraumatic. ENT:      Nose: No congestion/rhinnorhea.      Mouth/Throat: Mucous membranes are moist. Right lower posterior molar with 30% of the tooth broken off. There is surrounding gumline irritation and swelling with mild tenderness to palpation. Pharynx without erythema, swelling,  exudate. Uvula is midline. Airway is patent. Neck: Supple with full range of motion. Hematological/Lymphatic/Immunilogical: No cervical lymphadenopathy. Cardiovascular: Good peripheral circulation. Respiratory: Normal respiratory effort without tachypnea or retractions.  Musculoskeletal: No tenderness of the TMJ or jaw line to palpation. Neurologic:  Normal speech and language. No gross focal neurologic deficits are appreciated.  Skin:  Skin is warm, dry and intact. No rash or redness, swelling, abnormal warmth noted about the face or neck. Psychiatric: Mood and affect are normal. Speech and behavior are normal. Patient exhibits appropriate insight and judgement.   ____________________________________________    LABS  None ____________________________________________  EKG  None ____________________________________________  RADIOLOGY  None ____________________________________________    PROCEDURES  Procedure(s) performed: None   Procedures   Medications - No data to display   ____________________________________________   INITIAL IMPRESSION / ASSESSMENT AND PLAN / ED COURSE  Pertinent labs & imaging results that were available during my care of the patient were reviewed by me and considered in my medical decision making (see chart for details).     Patient's diagnosis is consistent with infected dental caries and broken tooth. Patient will be discharged home with prescriptions for amoxicillin, lidocaine and lidocaine viscus to use as directed. Patient is to follow up with [facility] if symptoms persist past this treatment course. Patient is given ED precautions to return to the ED for any worsening or new symptoms.    ____________________________________________  FINAL CLINICAL IMPRESSION(S) / ED DIAGNOSES  Final diagnoses:  Infected dental caries  Open fracture of tooth, initial encounter      NEW MEDICATIONS STARTED DURING THIS VISIT:  Discharge Medication List as of 01/30/2017  7:28 AM    START taking these medications   Details  amoxicillin (AMOXIL) 500 MG tablet Take 1 tablet (500 mg total) by mouth 3 (three) times daily with meals., Starting Wed 01/30/2017, Print    etodolac (LODINE) 400 MG tablet Take 1 tablet (400 mg total) by mouth 2 (two) times daily., Starting Wed 01/30/2017, Until Wed 02/06/2017, Print    lidocaine (XYLOCAINE) 2 % solution Use as directed 10 mLs in the mouth or throat every 4 (four) hours as needed for mouth pain., Starting Wed 01/30/2017, Print             Ernestene Kiel Burr, PA-C 01/30/17 0831    Arnaldo Natal, MD 01/30/17 1246

## 2017-01-30 NOTE — ED Triage Notes (Addendum)
Patient ambulatory to triage with steady gait, without difficulty or distress noted; pt reports right lower toothache since last night; using oragel without relief; st his tooth broke off about 6mos ago and has been unable to get into dentist yet

## 2017-01-30 NOTE — ED Notes (Signed)
See triage note  States he developed tooth pain couple of days ago  Became worse last pm  States tooth has been broken for about 6 months

## 2018-03-22 ENCOUNTER — Encounter: Payer: Self-pay | Admitting: *Deleted

## 2018-03-22 ENCOUNTER — Emergency Department
Admission: EM | Admit: 2018-03-22 | Discharge: 2018-03-22 | Disposition: A | Discharge status: Home / Self Care | Payer: Self-pay | Attending: Emergency Medicine | Admitting: Emergency Medicine

## 2018-03-22 ENCOUNTER — Other Ambulatory Visit: Payer: Self-pay

## 2018-03-22 DIAGNOSIS — F17210 Nicotine dependence, cigarettes, uncomplicated: Secondary | ICD-10-CM | POA: Insufficient documentation

## 2018-03-22 DIAGNOSIS — K029 Dental caries, unspecified: Secondary | ICD-10-CM | POA: Insufficient documentation

## 2018-03-22 MED ORDER — AMOXICILLIN-POT CLAVULANATE 875-125 MG PO TABS
1.0000 | ORAL_TABLET | Freq: Once | ORAL | Status: AC
  Administered 2018-03-22: 1 via ORAL

## 2018-03-22 MED ORDER — LIDOCAINE VISCOUS 2 % MT SOLN
15.0000 mL | Freq: Once | OROMUCOSAL | Status: AC
  Administered 2018-03-22: 15 mL via OROMUCOSAL

## 2018-03-22 MED ORDER — LIDOCAINE VISCOUS 2 % MT SOLN
OROMUCOSAL | Status: AC
  Administered 2018-03-22: 15 mL via OROMUCOSAL
  Filled 2018-03-22: qty 15

## 2018-03-22 MED ORDER — KETOROLAC TROMETHAMINE 10 MG PO TABS: 10.0000 mg | ORAL_TABLET | Freq: Four times a day (QID) | ORAL | 0 refills | Status: DC | PRN

## 2018-03-22 MED ORDER — KETOROLAC TROMETHAMINE 10 MG PO TABS
ORAL_TABLET | ORAL | Status: AC
  Administered 2018-03-22: 10 mg via ORAL
  Filled 2018-03-22: qty 1

## 2018-03-22 MED ORDER — AMOXICILLIN-POT CLAVULANATE 875-125 MG PO TABS: 1.0000 | ORAL_TABLET | Freq: Two times a day (BID) | ORAL | 0 refills | Status: AC

## 2018-03-22 MED ORDER — AMOXICILLIN-POT CLAVULANATE 875-125 MG PO TABS
ORAL_TABLET | ORAL | Status: AC
  Administered 2018-03-22: 1 via ORAL
  Filled 2018-03-22: qty 1

## 2018-03-22 MED ORDER — KETOROLAC TROMETHAMINE 10 MG PO TABS
10.0000 mg | ORAL_TABLET | Freq: Once | ORAL | Status: AC
Start: 2018-03-22 — End: 2018-03-22
  Administered 2018-03-22: 10 mg via ORAL

## 2018-03-22 NOTE — ED Triage Notes (Signed)
Pt to ED reporting recurrent dental pain in a broken lower right molar. Pt reports pain worsened today but has been a recurrent problem for the past three months. Pt does not have a dentist. No fevers reported and no oral swelling noted at this time. No difficulty breathing and no reports of swelling in throat .

## 2018-03-22 NOTE — ED Provider Notes (Signed)
[place] Emergency Department Provider Note   First MD Initiated Contact with Patient 03/22/18 0110     (approximate)  I have reviewed the triage vital signs and the nursing notes.   HISTORY  Chief Complaint Dental Pain    HPI Corey House. is a 52 y.o. male presents to the emergency department with a 54-month history of right mandible molar dental pain secondary to cavity and a broken tooth in that area.  Patient states that he has been unable to follow-up with a dentist as planned.  Patient states his current pain score is 10 out of 10.  Patient denies any fever no difficulty swallowing   Past Medical History:  Diagnosis Date  . Urolithiasis   . Urolithiasis     There are no active problems to display for this patient.   Past Surgical History:  Procedure Laterality Date  . HERNIA REPAIR      Prior to Admission medications   Medication Sig Start Date End Date Taking? Authorizing Provider  amoxicillin (AMOXIL) 500 MG tablet Take 1 tablet (500 mg total) by mouth 3 (three) times daily with meals. 01/30/17   Hagler, Jami L, PA-C  lidocaine (XYLOCAINE) 2 % solution Use as directed 10 mLs in the mouth or throat every 4 (four) hours as needed for mouth pain. 01/30/17   Hagler, Jami L, PA-C    Allergies Patient has no known allergies.  History reviewed. No pertinent family history.  Social History Social History   Tobacco Use  . Smoking status: Current Every Day Smoker    Packs/day: 1.00    Types: Cigarettes  . Smokeless tobacco: Never Used  Substance Use Topics  . Alcohol use: No  . Drug use: Not on file    Review of Systems Constitutional: No fever/chills Eyes: No visual changes. ENT: No sore throat.  Positive for dental pain Cardiovascular: Denies chest pain. Respiratory: Denies shortness of breath. Gastrointestinal: No abdominal pain.  No nausea, no vomiting.  No diarrhea.  No constipation. Genitourinary: Negative for  dysuria. Musculoskeletal: Negative for neck pain.  Negative for back pain. Integumentary: Negative for rash. Neurological: Negative for headaches, focal weakness or numbness.   ____________________________________________   PHYSICAL EXAM:  VITAL SIGNS: ED Triage Vitals  Enc Vitals Group     BP 03/22/18 0053 (!) 155/97     Pulse Rate 03/22/18 0053 88     Resp 03/22/18 0053 16     Temp 03/22/18 0053 98.9 F (37.2 C)     Temp Source 03/22/18 0053 Oral     SpO2 03/22/18 0053 98 %     Weight 03/22/18 0054 72.6 kg (160 lb)     Height 03/22/18 0054 1.727 m (5\' 8" )     Head Circumference --      Peak Flow --      Pain Score 03/22/18 0054 10     Pain Loc --      Pain Edu? --      Excl. in GC? --     Constitutional: Alert and oriented.  Apparent discomfort  eyes: Conjunctivae are normal.  Head: Atraumatic. Nose: No congestion/rhinnorhea. Mouth/Throat: Mucous membranes are moist. Oropharynx non-erythematous.  Right posterior mandible dental cavity with erosion down to the gumline with surrounding gum swelling. Neck: No stridor.   Cardiovascular: Normal rate, regular rhythm. Good peripheral circulation. Grossly normal heart sounds. Neurologic:  Normal speech and language. No gross focal neurologic deficits are appreciated.  Skin:  Skin is warm,  dry and intact. No rash noted. Psychiatric: Mood and affect are normal. Speech and behavior are normal.  ___________________________________________ INITIAL IMPRESSION / ASSESSMENT AND PLAN / ED COURSE  As part of my medical decision making, I reviewed the following data within the electronic MEDICAL RECORD NUMBER   52 year old male presented with above-stated history physical exam consistent with dental carry.  Patient given Augmentin, Toradol and viscous lidocaine with resolution of pain in the emergency department.  Patient will prescribe Augmentin Toradol for home. ____________________________________________  FINAL CLINICAL IMPRESSION(S)  / ED DIAGNOSES  Final diagnoses:  Dental caries     MEDICATIONS GIVEN DURING THIS VISIT:  Medications  lidocaine (XYLOCAINE) 2 % viscous mouth solution 15 mL (15 mLs Mouth/Throat Given 03/22/18 0133)  ketorolac (TORADOL) tablet 10 mg (10 mg Oral Given 03/22/18 0133)  amoxicillin-clavulanate (AUGMENTIN) 875-125 MG per tablet 1 tablet (1 tablet Oral Given 03/22/18 0133)     ED Discharge Orders    None       Note:  This document was prepared using Dragon voice recognition software and may include unintentional dictation errors.    Darci CurrentBrown, [place], MD 03/22/18 [phone]

## 2018-05-04 ENCOUNTER — Emergency Department
Admission: EM | Admit: 2018-05-04 | Discharge: 2018-05-04 | Disposition: A | Discharge status: Home / Self Care | Payer: Self-pay | Attending: Emergency Medicine | Admitting: Emergency Medicine

## 2018-05-04 ENCOUNTER — Other Ambulatory Visit: Payer: Self-pay

## 2018-05-04 ENCOUNTER — Emergency Department: Payer: Self-pay

## 2018-05-04 DIAGNOSIS — K5900 Constipation, unspecified: Secondary | ICD-10-CM | POA: Insufficient documentation

## 2018-05-04 DIAGNOSIS — F17210 Nicotine dependence, cigarettes, uncomplicated: Secondary | ICD-10-CM | POA: Insufficient documentation

## 2018-05-04 LAB — COMPREHENSIVE METABOLIC PANEL
ALT: 19 U/L (ref 17–63)
AST: 27 U/L (ref 15–41)
Albumin: 4.1 g/dL (ref 3.5–5.0)
Alkaline Phosphatase: 68 U/L (ref 38–126)
Anion gap: 8 (ref 5–15)
BUN: 11 mg/dL (ref 6–20)
CALCIUM: 8.7 mg/dL — AB (ref 8.9–10.3)
CO2: 27 mmol/L (ref 22–32)
CREATININE: 0.72 mg/dL (ref 0.61–1.24)
Chloride: 103 mmol/L (ref 101–111)
Glucose, Bld: 107 mg/dL — ABNORMAL HIGH (ref 65–99)
Potassium: 4.2 mmol/L (ref 3.5–5.1)
Sodium: 138 mmol/L (ref 135–145)
Total Bilirubin: 0.6 mg/dL (ref 0.3–1.2)
Total Protein: 6.8 g/dL (ref 6.5–8.1)

## 2018-05-04 LAB — URINALYSIS, COMPLETE (UACMP) WITH MICROSCOPIC
Bacteria, UA: NONE SEEN
Bilirubin Urine: NEGATIVE
GLUCOSE, UA: NEGATIVE mg/dL
Hgb urine dipstick: NEGATIVE
KETONES UR: NEGATIVE mg/dL
Leukocytes, UA: NEGATIVE
Nitrite: NEGATIVE
PH: 5 (ref 5.0–8.0)
Protein, ur: NEGATIVE mg/dL
SPECIFIC GRAVITY, URINE: 1.024 (ref 1.005–1.030)

## 2018-05-04 LAB — CBC
HCT: 45 % (ref 40.0–52.0)
Hemoglobin: 15.7 g/dL (ref 13.0–18.0)
MCH: 31.5 pg (ref 26.0–34.0)
MCHC: 34.9 g/dL (ref 32.0–36.0)
MCV: 90.1 fL (ref 80.0–100.0)
PLATELETS: 272 10*3/uL (ref 150–440)
RBC: 4.99 MIL/uL (ref 4.40–5.90)
RDW: 14.4 % (ref 11.5–14.5)
WBC: 5.7 10*3/uL (ref 3.8–10.6)

## 2018-05-04 LAB — LIPASE, BLOOD: Lipase: 22 U/L (ref 11–51)

## 2018-05-04 MED ORDER — SENNOSIDES-DOCUSATE SODIUM 8.6-50 MG PO TABS: 2.0000 | ORAL_TABLET | Freq: Two times a day (BID) | ORAL | 0 refills | Status: DC

## 2018-05-04 MED ORDER — POLYETHYLENE GLYCOL 3350 17 GM/SCOOP PO POWD: ORAL | 0 refills | Status: DC

## 2018-05-04 NOTE — Discharge Instructions (Signed)
Your xray today did not show any acute issues. Take Senokot and Miralax as prescribed to treat constipation. It may also be helpful to start with one bottle of magnesium citrate ( ), which you can buy over the counter at any drug store.

## 2018-05-04 NOTE — ED Provider Notes (Signed)
[facility] Emergency Department Provider Note  ____________________________________________  Time seen: Approximately 7:49 AM  I have reviewed the triage vital signs and the nursing notes.   HISTORY  Chief Complaint Abdominal Pain    HPI Corey House. is a 52 y.o. male with a history of kidney stones and inguinal hernias who complains of constipation for the past 5 days. He has had passing gas and very small hard stools, but not able to have a normal bowel movement. He has a urge like he feels like he needs to have a bowel movement but is unable to pass his stool. He has a history of inguinal hernia bilaterally that has been repaired. Denies any new hernias or pain. She reports left lower quadrant discomfort, but denies that it is "pain". Nonradiating, no aggravating or alleviating factors, mild and constant.      Past Medical History:  Diagnosis Date  . Urolithiasis   . Urolithiasis      There are no active problems to display for this patient.    Past Surgical History:  Procedure Laterality Date  . HERNIA REPAIR       Prior to Admission medications   Medication Sig Start Date End Date Taking? Authorizing Provider  amoxicillin (AMOXIL) 500 MG tablet Take 1 tablet (500 mg total) by mouth 3 (three) times daily with meals. 01/30/17   Hagler, Jami L, PA-C  ketorolac (TORADOL) 10 MG tablet Take 1 tablet (10 mg total) by mouth every 6 (six) hours as needed. 03/22/18   Darci Current, MD  lidocaine (XYLOCAINE) 2 % solution Use as directed 10 mLs in the mouth or throat every 4 (four) hours as needed for mouth pain. 01/30/17   Hagler, Jami L, PA-C  polyethylene glycol powder (GLYCOLAX/MIRALAX) powder 1 cap full in a full glass of water, two times a day for 3 days. 05/04/18   Sharman Cheek, MD  senna-docusate (SENOKOT-S) 8.6-50 MG tablet Take 2 tablets by mouth 2 (two) times daily. 05/04/18   Sharman Cheek, MD     Allergies Patient has no  known allergies.   No family history on file.  Social History Social History   Tobacco Use  . Smoking status: Current Every Day Smoker    Packs/day: 1.00    Types: Cigarettes  . Smokeless tobacco: Never Used  Substance Use Topics  . Alcohol use: No  . Drug use: Not on file    Review of Systems  Constitutional:   No fever or chills.  ENT:   No sore throat. No rhinorrhea. Cardiovascular:   No chest pain or syncope. Respiratory:   No dyspnea or cough. Gastrointestinal:   Negative for abdominal pain, vomiting and diarrhea. positive constipation Musculoskeletal:   Negative for focal pain or swelling All other systems reviewed and are negative except as documented above in ROS and HPI.  ____________________________________________   PHYSICAL EXAM:  VITAL SIGNS: ED Triage Vitals  Enc Vitals Group     BP 05/04/18 0046 136/87     Pulse Rate 05/04/18 0046 67     Resp 05/04/18 0046 20     Temp 05/04/18 0046 98.4 F (36.9 C)     Temp Source 05/04/18 0046 Oral     SpO2 05/04/18 0046 97 %     Weight 05/04/18 0047 165 lb (74.8 kg)     Height 05/04/18 0047  (1.727 m)     Head Circumference --      Peak Flow --  Pain Score 05/04/18 0047 1     Pain Loc --      Pain Edu? --      Excl. in GC? --     Vital signs reviewed, nursing assessments reviewed.   Constitutional:   Alert and oriented. Well appearing and in no distress. Eyes:   Conjunctivae are normal. EOMI. PERRL. ENT      Head:   Normocephalic and atraumatic.      Nose:   No congestion/rhinnorhea.       Mouth/Throat:   MMM, no pharyngeal erythema. No peritonsillar mass.       Neck:   No meningismus. Full ROM. Hematological/Lymphatic/Immunilogical:   No cervical lymphadenopathy. Cardiovascular:   RRR. Symmetric bilateral radial and DP pulses.  No murmurs.  Respiratory:   Normal respiratory effort without tachypnea/retractions. Breath sounds are clear and equal bilaterally. No  wheezes/rales/rhonchi. Gastrointestinal:   Soft and nontender. Non distended. There is no CVA tenderness.  No rebound, rigidity, or guarding.no hernias. Scars present from bilateral inguinal hernia open repairs in the past. rectal exam shows no hemorrhoids, brown stool, no fecal impaction or large amount of stool in the rectum. Genitourinary:   normal Musculoskeletal:   Normal range of motion in all extremities. No joint effusions.  No lower extremity tenderness.  No edema. Neurologic:   Normal speech and language.  Motor grossly intact. No acute focal neurologic deficits are appreciated.  Skin:    Skin is warm, dry and intact. No rash noted.  No petechiae, purpura, or bullae.  ____________________________________________    LABS (pertinent positives/negatives) (all labs ordered are listed, but only abnormal results are displayed) Labs Reviewed  COMPREHENSIVE METABOLIC PANEL - Abnormal; Notable for the following components:      Result Value   Glucose, Bld 107 (*)    Calcium 8.7 (*)    All other components within normal limits  URINALYSIS, COMPLETE (UACMP) WITH MICROSCOPIC - Abnormal; Notable for the following components:   Color, Urine YELLOW (*)    APPearance CLEAR (*)    All other components within normal limits  LIPASE, BLOOD  CBC   ____________________________________________   EKG    ____________________________________________    RADIOLOGY  Dg Abd 2 Views  Result Date: 05/04/2018 CLINICAL DATA:  Left lower abdominal pain several days. EXAM: ABDOMEN - 2 VIEW COMPARISON:  08/29/2011 FINDINGS: Examination demonstrates a nonobstructive bowel gas pattern with air throughout the colon. No evidence of free peritoneal air. No mass or mass effect. Remainder of the exam is unchanged. IMPRESSION: Nonobstructive bowel gas pattern. Electronically Signed   By: Elberta Fortis M.D.   On: 05/04/2018 07:53     ____________________________________________   PROCEDURES Procedures  ____________________________________________    CLINICAL IMPRESSION / ASSESSMENT AND PLAN / ED COURSE  Pertinent labs & imaging results that were available during my care of the patient were reviewed by me and considered in my medical decision making (see chart for details).    patient well appearing no acute distress, normal vital signs, relatively concerning medical history, presents with vague abdominal discomfort and constipation. Low suspicion for bowel obstruction or perforation. I doubt diverticulitis or appendicitis AAA dissection or biliary disease. Since he does not have a fecal impaction, I will get an x-ray of the abdomen to evaluate for constipation severity. Plan to start bowel regimen with laxatives including mag citrate and MiraLAX Senokot.  Clinical Course as of May 05 851  Sun May 04, 2018  1610 abdominal x-ray imaging be by me, shows extensive  colonic stool and air. Radiology report is nonobstructive and nonacute. Start the patient on aggressive bowel regimen, suitable for outpatient follow-up.Considering the patient's symptoms, medical history, and physical examination today, I have low suspicion for cholecystitis or biliary pathology, pancreatitis, perforation or bowel obstruction, hernia, intra-abdominal abscess, AAA or dissection, volvulus or intussusception, mesenteric ischemia, or appendicitis.     [PS]    Clinical Course User Index [PS] Sharman Cheek, MD     ____________________________________________   FINAL CLINICAL IMPRESSION(S) / ED DIAGNOSES    Final diagnoses:  Constipation  Constipation, unspecified constipation type     ED Discharge Orders        Ordered    senna-docusate (SENOKOT-S) 8.6-50 MG tablet  2 times daily     05/04/18 0850    polyethylene glycol powder (GLYCOLAX/MIRALAX) powder     05/04/18 0850      Portions of this note were generated with  dragon dictation software. Dictation errors may occur despite best attempts at proofreading.    Sharman Cheek, MD 05/04/18 [phone]

## 2018-05-04 NOTE — ED Triage Notes (Signed)
Patient reports left lower abdominal pain for several days.  Patient reports he is able to pass gas, but has to strain to have bowel movement.

## 2018-06-14 ENCOUNTER — Encounter: Payer: Self-pay | Admitting: Emergency Medicine

## 2018-06-14 ENCOUNTER — Other Ambulatory Visit: Payer: Self-pay

## 2018-06-14 ENCOUNTER — Emergency Department
Admission: EM | Admit: 2018-06-14 | Discharge: 2018-06-14 | Disposition: A | Discharge status: Home / Self Care | Payer: Self-pay | Attending: Emergency Medicine | Admitting: Emergency Medicine

## 2018-06-14 ENCOUNTER — Emergency Department: Payer: Self-pay

## 2018-06-14 DIAGNOSIS — Z79899 Other long term (current) drug therapy: Secondary | ICD-10-CM | POA: Insufficient documentation

## 2018-06-14 DIAGNOSIS — R1030 Lower abdominal pain, unspecified: Secondary | ICD-10-CM | POA: Insufficient documentation

## 2018-06-14 DIAGNOSIS — F17210 Nicotine dependence, cigarettes, uncomplicated: Secondary | ICD-10-CM | POA: Insufficient documentation

## 2018-06-14 DIAGNOSIS — K5900 Constipation, unspecified: Secondary | ICD-10-CM | POA: Insufficient documentation

## 2018-06-14 LAB — URINALYSIS, COMPLETE (UACMP) WITH MICROSCOPIC
Bacteria, UA: NONE SEEN
Bilirubin Urine: NEGATIVE
Glucose, UA: NEGATIVE mg/dL
Hgb urine dipstick: NEGATIVE
Ketones, ur: 5 mg/dL — AB
Leukocytes, UA: NEGATIVE
NITRITE: NEGATIVE
Protein, ur: NEGATIVE mg/dL
SPECIFIC GRAVITY, URINE: 1.027 (ref 1.005–1.030)
SQUAMOUS EPITHELIAL / LPF: NONE SEEN (ref 0–5)
pH: 5 (ref 5.0–8.0)

## 2018-06-14 LAB — CBC
HEMATOCRIT: 44.4 % (ref 40.0–52.0)
Hemoglobin: 15.4 g/dL (ref 13.0–18.0)
MCH: 31.5 pg (ref 26.0–34.0)
MCHC: 34.7 g/dL (ref 32.0–36.0)
MCV: 90.5 fL (ref 80.0–100.0)
PLATELETS: 285 10*3/uL (ref 150–440)
RBC: 4.91 MIL/uL (ref 4.40–5.90)
RDW: 14.4 % (ref 11.5–14.5)
WBC: 7.8 10*3/uL (ref 3.8–10.6)

## 2018-06-14 LAB — LIPASE, BLOOD: Lipase: 25 U/L (ref 11–51)

## 2018-06-14 LAB — COMPREHENSIVE METABOLIC PANEL
ALBUMIN: 4 g/dL (ref 3.5–5.0)
ALT: 19 U/L (ref 0–44)
AST: 27 U/L (ref 15–41)
Alkaline Phosphatase: 65 U/L (ref 38–126)
Anion gap: 10 (ref 5–15)
BILIRUBIN TOTAL: 0.2 mg/dL — AB (ref 0.3–1.2)
BUN: 19 mg/dL (ref 6–20)
CO2: 22 mmol/L (ref 22–32)
CREATININE: 0.83 mg/dL (ref 0.61–1.24)
Calcium: 8.5 mg/dL — ABNORMAL LOW (ref 8.9–10.3)
Chloride: 108 mmol/L (ref 98–111)
GFR calc Af Amer: >60 mL/min (ref >60)
GLUCOSE: 125 mg/dL — AB (ref 70–99)
POTASSIUM: 3.7 mmol/L (ref 3.5–5.1)
Sodium: 140 mmol/L (ref 135–145)
TOTAL PROTEIN: 6.7 g/dL (ref 6.5–8.1)

## 2018-06-14 MED ORDER — IOHEXOL 350 MG/ML SOLN
75.0000 mL | Freq: Once | INTRAVENOUS | Status: AC | PRN
  Administered 2018-06-14: 75 mL via INTRAVENOUS

## 2018-06-14 MED ORDER — SODIUM CHLORIDE 0.9 % IV BOLUS
1000.0000 mL | Freq: Once | INTRAVENOUS | Status: AC
  Administered 2018-06-14: 1000 mL via INTRAVENOUS

## 2018-06-14 NOTE — ED Notes (Signed)
Pt to CT at this time.

## 2018-06-14 NOTE — ED Notes (Signed)
Pt resting. Respirations even and unlabored. NAD. Stretcher in low and locked position. Call bell in reach. Denies needs at this time. Will continue to monitor. 

## 2018-06-14 NOTE — ED Triage Notes (Signed)
Lower L abdominal pain x 2 months.

## 2018-06-14 NOTE — ED Notes (Signed)
Pt back from CT

## 2018-06-14 NOTE — ED Provider Notes (Signed)
[facility] Emergency Department Provider Note ____________________________________________   First MD Initiated Contact with Patient 06/14/18 1320     (approximate)  I have reviewed the triage vital signs and the nursing notes.  HISTORY  Chief Complaint Abdominal Pain  HPI Corey SchoonerKenneth W Ulbricht Jr. is a 52 y.o. male with a history of kidney stones was presenting to the emergency department today complaining of persistent left lower quadrant abdominal pain associated with constipation.  He says that he was evaluated 1 month ago in the emergency department when he had an x-ray for this but has had persistent symptoms.  He states that he took a stool softener x1 and moved his bowels but the pain persisted.  He says it is an irritation to his left lower quadrant that is nonradiating.  Denies any nausea or vomiting and says that he is eating well.  However, says that he has to strain to move his bowels.  Denies any burning or difficulty with urination.  Does not report blood in his stool.  Past Medical History:  Diagnosis Date  . Urolithiasis   . Urolithiasis     There are no active problems to display for this patient.   Past Surgical History:  Procedure Laterality Date  . HERNIA REPAIR      Prior to Admission medications   Medication Sig Start Date End Date Taking? Authorizing Provider  amoxicillin (AMOXIL) 500 MG tablet Take 1 tablet (500 mg total) by mouth 3 (three) times daily with meals. 01/30/17   Hagler, Jami L, PA-C  ketorolac (TORADOL) 10 MG tablet Take 1 tablet (10 mg total) by mouth every 6 (six) hours as needed. 03/22/18   Darci CurrentBrown, [place] N, MD  lidocaine (XYLOCAINE) 2 % solution Use as directed 10 mLs in the mouth or throat every 4 (four) hours as needed for mouth pain. 01/30/17   Hagler, Jami L, PA-C  polyethylene glycol powder (GLYCOLAX/MIRALAX) powder 1 cap full in a full glass of water, two times a day for 3 days. 05/04/18   Sharman CheekStafford, Phillip, MD    senna-docusate (SENOKOT-S) 8.6-50 MG tablet Take 2 tablets by mouth 2 (two) times daily. 05/04/18   Sharman CheekStafford, Phillip, MD    Allergies Patient has no known allergies.  No family history on file.  Social History Social History   Tobacco Use  . Smoking status: Current Every Day Smoker    Packs/day: 1.00    Types: Cigarettes  . Smokeless tobacco: Never Used  Substance Use Topics  . Alcohol use: No  . Drug use: Not on file    Review of Systems  Constitutional: No fever/chills Eyes: No visual changes. ENT: No sore throat. Cardiovascular: Denies chest pain. Respiratory: Denies shortness of breath. Gastrointestinal: No nausea, no vomiting.  No diarrhea.   Genitourinary: Negative for dysuria. Musculoskeletal: Negative for back pain. Skin: Negative for rash. Neurological: Negative for headaches, focal weakness or numbness. ____________________________________________   PHYSICAL EXAM:  VITAL SIGNS: ED Triage Vitals  Enc Vitals Group     BP 06/14/18 1229 (!) 140/107     Pulse Rate 06/14/18 1229 77     Resp 06/14/18 1229 20     Temp 06/14/18 1229 98 F (36.7 C)     Temp Source 06/14/18 1229 Oral     SpO2 06/14/18 1229 98 %     Weight 06/14/18 1230 150 lb (68 kg)     Height 06/14/18 1230 5\' 8"  (1.727 m)     Head Circumference --  Peak Flow --      Pain Score 06/14/18 1230 8     Pain Loc --      Pain Edu? --      Excl. in GC? --     Constitutional: Alert and oriented.  in no acute distress. Eyes: Conjunctivae are normal.  Head: Atraumatic. Nose: No congestion/rhinnorhea. Mouth/Throat: Mucous membranes are moist.  Neck: No stridor.   Cardiovascular: Normal rate, regular rhythm. Grossly normal heart sounds. Respiratory: Normal respiratory effort.  No retractions. Lungs CTAB. Gastrointestinal: Soft with mild left lower quadrant tenderness to palpation without any rebound or guarding. No distention. No CVA tenderness. Musculoskeletal: No lower extremity  tenderness nor edema.  No joint effusions. Neurologic:  Normal speech and language. No gross focal neurologic deficits are appreciated. Skin:  Skin is warm, dry and intact. No rash noted. Psychiatric: Mood and affect are normal. Speech and behavior are normal.  ____________________________________________   LABS (all labs ordered are listed, but only abnormal results are displayed)  Labs Reviewed  COMPREHENSIVE METABOLIC PANEL - Abnormal; Notable for the following components:      Result Value   Glucose, Bld 125 (*)    Calcium 8.5 (*)    Total Bilirubin 0.2 (*)    All other components within normal limits  URINALYSIS, COMPLETE (UACMP) WITH MICROSCOPIC - Abnormal; Notable for the following components:   Color, Urine YELLOW (*)    APPearance CLEAR (*)    Ketones, ur 5 (*)    All other components within normal limits  LIPASE, BLOOD  CBC   ____________________________________________  EKG   ____________________________________________  RADIOLOGY  CTAP with tiny bilateral renal calculi.  Diverticulosis.  No acute finding. ____________________________________________   PROCEDURES  Procedure(s) performed:   Procedures  Critical Care performed:   ____________________________________________   INITIAL IMPRESSION / ASSESSMENT AND PLAN / ED COURSE  Pertinent labs & imaging results that were available during my care of the patient were reviewed by me and considered in my medical decision making (see chart for details).  Differential diagnosis includes, but is not limited to, acute appendicitis, renal colic, testicular torsion, urinary tract infection/pyelonephritis, prostatitis,  epididymitis, diverticulitis, small bowel obstruction or ileus, colitis, abdominal aortic aneurysm, gastroenteritis, hernia, etc. As part of my medical decision making, I reviewed the following data within the electronic MEDICAL RECORD NUMBER Notes from prior ED  visits  ----------------------------------------- 3:07 PM on 06/14/2018 -----------------------------------------  No acute finding on the CT of the abdomen and pelvis.  Patient clinically is constipated.  He still says that he has stool softeners from his last visit 1 month ago.  I advised him to continue with the stool softeners and increase the fiber and water content of his diet.  I will give him follow-up with gastroneurologist.  He does understand the diagnosis as well as treatment plan willing to comply. ____________________________________________   FINAL CLINICAL IMPRESSION(S) / ED DIAGNOSES  Lower abdominal pain.  Constipation.    NEW MEDICATIONS STARTED DURING THIS VISIT:  New Prescriptions   No medications on file     Note:  This document was prepared using Dragon voice recognition software and may include unintentional dictation errors.     Myrna Blazer, MD 06/14/18 [phone]

## 2018-06-29 ENCOUNTER — Emergency Department: Payer: Self-pay

## 2018-06-29 ENCOUNTER — Encounter: Payer: Self-pay | Admitting: Emergency Medicine

## 2018-06-29 ENCOUNTER — Other Ambulatory Visit: Payer: Self-pay

## 2018-06-29 DIAGNOSIS — F17210 Nicotine dependence, cigarettes, uncomplicated: Secondary | ICD-10-CM | POA: Insufficient documentation

## 2018-06-29 DIAGNOSIS — M549 Dorsalgia, unspecified: Secondary | ICD-10-CM | POA: Insufficient documentation

## 2018-06-29 DIAGNOSIS — R079 Chest pain, unspecified: Secondary | ICD-10-CM | POA: Insufficient documentation

## 2018-06-29 DIAGNOSIS — J40 Bronchitis, not specified as acute or chronic: Secondary | ICD-10-CM | POA: Insufficient documentation

## 2018-06-29 DIAGNOSIS — R0602 Shortness of breath: Secondary | ICD-10-CM | POA: Insufficient documentation

## 2018-06-29 LAB — BASIC METABOLIC PANEL
Anion gap: 9 (ref 5–15)
BUN: 23 mg/dL — AB (ref 6–20)
CO2: 27 mmol/L (ref 22–32)
Calcium: 9.1 mg/dL (ref 8.9–10.3)
Chloride: 107 mmol/L (ref 98–111)
Creatinine, Ser: 0.92 mg/dL (ref 0.61–1.24)
GFR calc Af Amer: >60 mL/min (ref >60)
GLUCOSE: 123 mg/dL — AB (ref 70–99)
POTASSIUM: 3.3 mmol/L — AB (ref 3.5–5.1)
Sodium: 143 mmol/L (ref 135–145)

## 2018-06-29 LAB — CBC
HEMATOCRIT: 45.8 % (ref 40.0–52.0)
Hemoglobin: 15.9 g/dL (ref 13.0–18.0)
MCH: 31.3 pg (ref 26.0–34.0)
MCHC: 34.6 g/dL (ref 32.0–36.0)
MCV: 90.4 fL (ref 80.0–100.0)
PLATELETS: 261 10*3/uL (ref 150–440)
RBC: 5.07 MIL/uL (ref 4.40–5.90)
RDW: 13.5 % (ref 11.5–14.5)
WBC: 7 10*3/uL (ref 3.8–10.6)

## 2018-06-29 LAB — TROPONIN I

## 2018-06-29 MED ORDER — IPRATROPIUM-ALBUTEROL 0.5-2.5 (3) MG/3ML IN SOLN
RESPIRATORY_TRACT | Status: AC
  Administered 2018-06-29: 3 mL via RESPIRATORY_TRACT
  Filled 2018-06-29: qty 3

## 2018-06-29 MED ORDER — IPRATROPIUM-ALBUTEROL 0.5-2.5 (3) MG/3ML IN SOLN
3.0000 mL | Freq: Once | RESPIRATORY_TRACT | Status: AC
  Administered 2018-06-29: 3 mL via RESPIRATORY_TRACT

## 2018-06-29 MED ORDER — ALBUTEROL SULFATE (2.5 MG/3ML) 0.083% IN NEBU: 5.0000 mg | INHALATION_SOLUTION | Freq: Once | RESPIRATORY_TRACT | Status: DC

## 2018-06-29 NOTE — ED Notes (Signed)
Pt to desk to inquire about wait time, pt updated at this time.  Pt verbalizes understanding. 

## 2018-06-29 NOTE — ED Triage Notes (Signed)
Pt arrives via POV with shortness of breath that began approx 2 days ago. Pt coughing up phlegm intermittently but "has done that [his] entire life." Pt is a smoker. Never been dx with asthma, COPD, etc.  C/o pressure in left chest with intermittent shooting pain. States he feels as though his left chest area could be more swollen than the right.

## 2018-06-30 ENCOUNTER — Emergency Department
Admission: EM | Admit: 2018-06-30 | Discharge: 2018-06-30 | Disposition: A | Discharge status: Home / Self Care | Payer: Self-pay | Attending: Emergency Medicine | Admitting: Emergency Medicine

## 2018-06-30 DIAGNOSIS — J40 Bronchitis, not specified as acute or chronic: Secondary | ICD-10-CM

## 2018-06-30 DIAGNOSIS — R0602 Shortness of breath: Secondary | ICD-10-CM

## 2018-06-30 DIAGNOSIS — R079 Chest pain, unspecified: Secondary | ICD-10-CM

## 2018-06-30 LAB — TROPONIN I: Troponin I: <0.03 ng/mL (ref <0.03)

## 2018-06-30 MED ORDER — ALBUTEROL SULFATE HFA 108 (90 BASE) MCG/ACT IN AERS: 2.0000 | INHALATION_SPRAY | Freq: Four times a day (QID) | RESPIRATORY_TRACT | 0 refills | Status: DC | PRN

## 2018-06-30 MED ORDER — METHYLPREDNISOLONE SODIUM SUCC 125 MG IJ SOLR
125.0000 mg | Freq: Once | INTRAMUSCULAR | Status: AC
  Administered 2018-06-30: 125 mg via INTRAVENOUS
  Filled 2018-06-30: qty 2

## 2018-06-30 MED ORDER — IPRATROPIUM-ALBUTEROL 0.5-2.5 (3) MG/3ML IN SOLN
3.0000 mL | Freq: Once | RESPIRATORY_TRACT | Status: AC
  Administered 2018-06-30: 3 mL via RESPIRATORY_TRACT
  Filled 2018-06-30: qty 3

## 2018-06-30 MED ORDER — AZITHROMYCIN 250 MG PO TABS: ORAL_TABLET | ORAL | 0 refills | Status: AC

## 2018-06-30 MED ORDER — PREDNISONE 20 MG PO TABS: 60.0000 mg | ORAL_TABLET | Freq: Every day | ORAL | 0 refills | Status: DC

## 2018-06-30 NOTE — ED Notes (Signed)
ED Provider at bedside.  Pt reports upper back pain earlier, but has now resolved  Clear white production with cough that stopped 2 weeks ago when t went6 on vacation away [employer]  Pt now without pain or acute distress, pain at upper chest with palpation

## 2018-06-30 NOTE — ED Provider Notes (Signed)
[facility] Emergency Department Provider Note   ____________________________________________   First MD Initiated Contact with Patient 06/30/18 [phone]     (approximate)  I have reviewed the triage vital signs and the nursing notes.   HISTORY  Chief Complaint Shortness of Breath    HPI Corey Coaxum. is a 52 y.o. male who comes into the hospital today stating that he is been hurting in his back and having some shortness of breath.  The patient also felt some pressure on his chest and he feels like his left chest is swollen more than the other side.  The patient reports that the symptoms started a couple of weeks ago.  Every once in a while he has to take a deep breath just to catch his breath but he is not having any shortness of breath when he is walking.  The patient denies any pain at this time he rates his pain a 0 out of 10 in intensity.  He reports that his pain in his back is in his upper back and it is worse when he is standing or sitting straight up but better when he rests and lays down.  The patient also thinks that his shortness of breath is due to work.  He is been out of work for the past 2 weeks and states that the symptoms have been getting better.  His cough is nonproductive.  He came in for evaluation.   Past Medical History:  Diagnosis Date  . Urolithiasis   . Urolithiasis     There are no active problems to display for this patient.   Past Surgical History:  Procedure Laterality Date  . HERNIA REPAIR      Prior to Admission medications   Medication Sig Start Date End Date Taking? Authorizing Provider  albuterol (PROVENTIL HFA;VENTOLIN HFA) 108 (90 Base) MCG/ACT inhaler Inhale 2 puffs into the lungs every 6 (six) hours as needed. 06/30/18   Rebecka Apley, MD  amoxicillin (AMOXIL) 500 MG tablet Take 1 tablet (500 mg total) by mouth 3 (three) times daily with meals. 01/30/17   Hagler, Jami L, PA-C  azithromycin (ZITHROMAX Z-PAK)  250 MG tablet Take 2 tablets (500 mg) on  Day 1,  followed by 1 tablet (250 mg) once daily on Days 2 through 5. 06/30/18 07/05/18  Rebecka Apley, MD  ketorolac (TORADOL) 10 MG tablet Take 1 tablet (10 mg total) by mouth every 6 (six) hours as needed. 03/22/18   Darci Current, MD  lidocaine (XYLOCAINE) 2 % solution Use as directed 10 mLs in the mouth or throat every 4 (four) hours as needed for mouth pain. 01/30/17   Hagler, Jami L, PA-C  polyethylene glycol powder (GLYCOLAX/MIRALAX) powder 1 cap full in a full glass of water, two times a day for 3 days. 05/04/18   Sharman Cheek, MD  predniSONE (DELTASONE) 20 MG tablet Take 3 tablets (60 mg total) by mouth daily. 06/30/18   Rebecka Apley, MD  senna-docusate (SENOKOT-S) 8.6-50 MG tablet Take 2 tablets by mouth 2 (two) times daily. 05/04/18   Sharman Cheek, MD    Allergies Patient has no known allergies.  No family history on file.  Social History Social History   Tobacco Use  . Smoking status: Current Every Day Smoker    Packs/day: 1.00    Types: Cigarettes  . Smokeless tobacco: Never Used  Substance Use Topics  . Alcohol use: No  . Drug use: Not on file  Review of Systems  Constitutional: No fever/chills Eyes: No visual changes. ENT: No sore throat. Cardiovascular:  chest pain. Respiratory:  shortness of breath. Gastrointestinal: No abdominal pain.  No nausea, no vomiting.   Genitourinary: Negative for dysuria. Musculoskeletal: Negative for back pain. Skin: Negative for rash. Neurological: Negative for headaches, focal weakness or numbness.   ____________________________________________   PHYSICAL EXAM:  VITAL SIGNS: ED Triage Vitals  Enc Vitals Group     BP 06/29/18 1819 131/89     Pulse Rate 06/29/18 1819 67     Resp 06/29/18 1819 20     Temp 06/29/18 1819 99.1 F (37.3 C)     Temp Source 06/29/18 1819 Oral     SpO2 06/29/18 1819 98 %     Weight 06/29/18 1821 150 lb (68 kg)     Height 06/29/18  1821 5\' 8"  (1.727 m)     Head Circumference --      Peak Flow --      Pain Score 06/29/18 1821 0     Pain Loc --      Pain Edu? --      Excl. in [place]? --     Constitutional: Alert and oriented. Well appearing and in no acute distress. Eyes: Conjunctivae are normal. PERRL. EOMI. Head: Atraumatic. Nose: No congestion/rhinnorhea. Mouth/Throat: Mucous membranes are moist.  Oropharynx non-erythematous. Cardiovascular: Normal rate, regular rhythm. Grossly normal heart sounds.  Good peripheral circulation. Respiratory: Normal respiratory effort.  No retractions. Lungs CTAB. Gastrointestinal: Soft and nontender. No distention.  Positive bowel sounds Musculoskeletal: No lower extremity tenderness nor edema.   Neurologic:  Normal speech and language.  Skin:  Skin is warm, dry and intact.  Psychiatric: Mood and affect are normal.   ____________________________________________   LABS (all labs ordered are listed, but only abnormal results are displayed)  Labs Reviewed  BASIC METABOLIC PANEL - Abnormal; Notable for the following components:      Result Value   Potassium 3.3 (*)    Glucose, Bld 123 (*)    BUN 23 (*)    All other components within normal limits  CBC  TROPONIN I  TROPONIN I   ____________________________________________  EKG  ED ECG REPORT I, Rebecka ApleyWebster,  Allison P, the attending physician, personally viewed and interpreted this ECG.   Date: 06/29/2018  EKG Time: 1822  Rate: 69  Rhythm: normal sinus rhythm  Axis: normal  Intervals:none  ST&T Change: none  ____________________________________________  RADIOLOGY  ED MD interpretation:  CXR: No acute pulmonary process identified  Official radiology report(s): Dg Chest 2 View  Result Date: 06/29/2018 CLINICAL DATA:  52 y/o M; shortness of breath. Pressure in the left chest. EXAM: CHEST - 2 VIEW COMPARISON:  03/29/2011 left shoulder radiographs. FINDINGS: Normal cardiac silhouette. No consolidation, effusion, or  pneumothorax. No acute pulmonary process identified. Chronic left mid clavicle fracture. Chronic left second, third, and fourth lateral rib fractures seen on prior shoulder radiograph. No acute osseous abnormality is evident. IMPRESSION: No acute pulmonary process identified. Electronically Signed   By: Mitzi HansenLance  Furusawa-Stratton M.D.   On: 06/29/2018 19:16    ____________________________________________   PROCEDURES  Procedure(s) performed: None  Procedures  Critical Care performed: No  ____________________________________________   INITIAL IMPRESSION / ASSESSMENT AND PLAN / ED COURSE  As part of my medical decision making, I reviewed the following data within the electronic MEDICAL RECORD NUMBER Notes from prior ED visits and [place] Controlled Substance Database   This is a 52 year old male who comes into the  hospital today with some shortness of breath some upper back pain and some chest pressure.  The patient states that his back pain did improve once he laid down on the bed and he was not standing or sitting up anymore.  The patient's other symptoms are improved at this time as well.  My differential diagnosis includes musculoskeletal pain, acute coronary syndrome, aortic dissection.  The patient has been having this pain for multiple weeks.  He is not describing any tearing pain in the pain does not radiate into his back there are 2 separate pains.  The patient's pain is resolved by the time that I did go to see him.  We did check some blood work to include a CBC, BMP and 2 troponins.  I did give the patient 2 DuoNeb treatments as well as some Solu-Medrol in the event that his shortness of breath was related to bronchitis or COPD.  The patient's blood work is unremarkable.  The patient also had a chest x-ray which was unremarkable.  As the patient's work-up is unremarkable he will be discharged home.  He will be encouraged to follow-up with the acute care clinic for further evaluation of his  symptoms.  The patient has no wheezing at this time.  He will be discharged home.      ____________________________________________   FINAL CLINICAL IMPRESSION(S) / ED DIAGNOSES  Final diagnoses:  Shortness of breath  Chest pain, unspecified type  Bronchitis     ED Discharge Orders        Ordered    azithromycin (ZITHROMAX Z-PAK) 250 MG tablet     06/30/18 0220    albuterol (PROVENTIL HFA;VENTOLIN HFA) 108 (90 Base) MCG/ACT inhaler  Every 6 hours PRN     06/30/18 0220    predniSONE (DELTASONE) 20 MG tablet  Daily     06/30/18 0220       Note:  This document was prepared using Dragon voice recognition software and may include unintentional dictation errors.    Rebecka Apley, MD 06/30/18 [phone]

## 2018-06-30 NOTE — ED Notes (Signed)
Peripheral IV discontinued. Catheter intact. No signs of infiltration or redness. Gauze applied to IV site.   Discharge instructions reviewed with patient. Questions fielded by this RN. Patient verbalizes understanding of instructions. Patient discharged home in stable condition per Webster. No acute distress noted at time of discharge.   

## 2018-06-30 NOTE — Discharge Instructions (Addendum)
Please follow-up with the acute care clinic.  Please return with any worsening symptoms or any other concerns.

## 2019-06-25 IMAGING — CR DG ABDOMEN 2V
2 series · 2 of 2 positions shown · non-contrast
Comparison: 08/29/2011

CLINICAL DATA: Left lower abdominal pain several days.

EXAM:
ABDOMEN - 2 VIEW

[abdomen erect (1 of 2)]
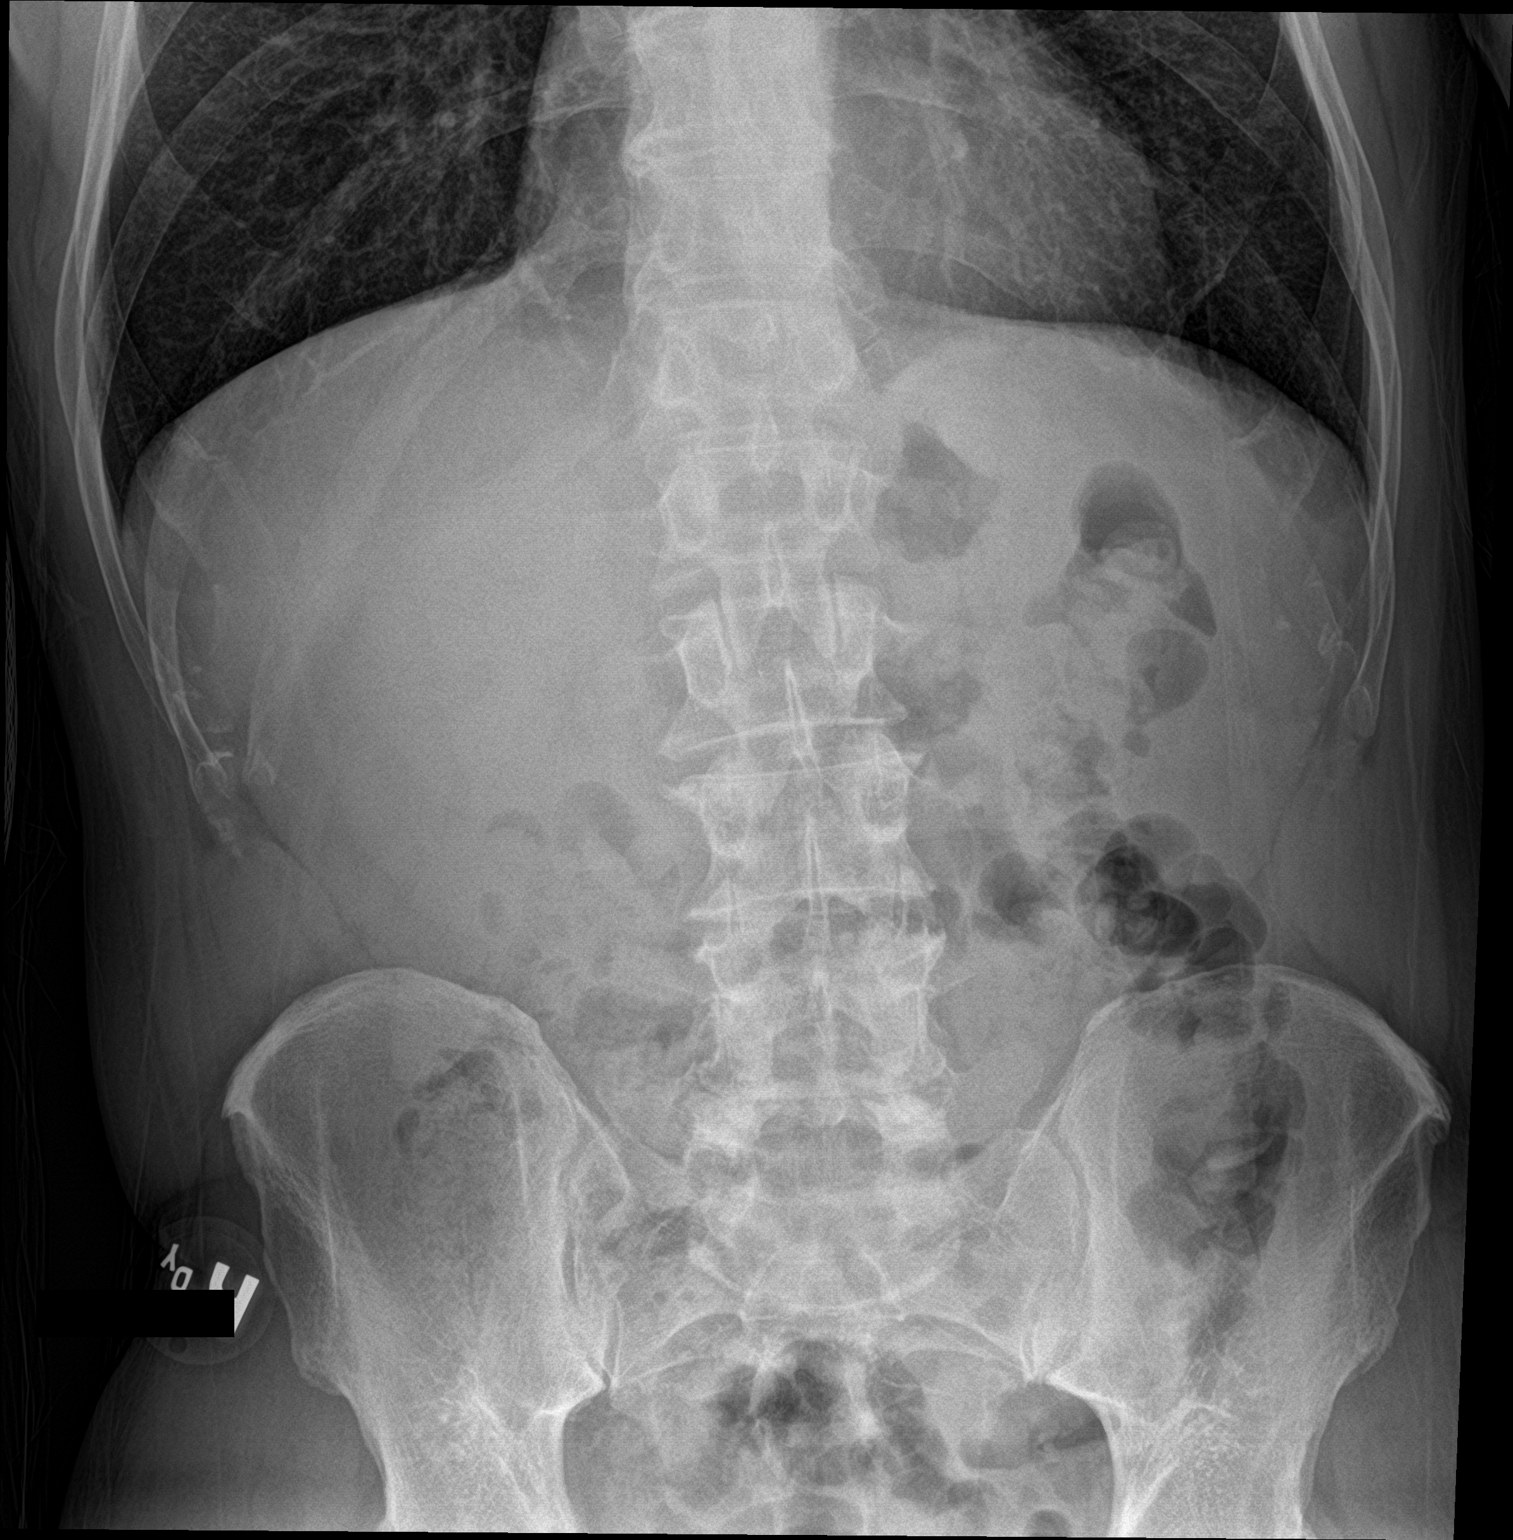

[abdomen erect (2 of 2)]
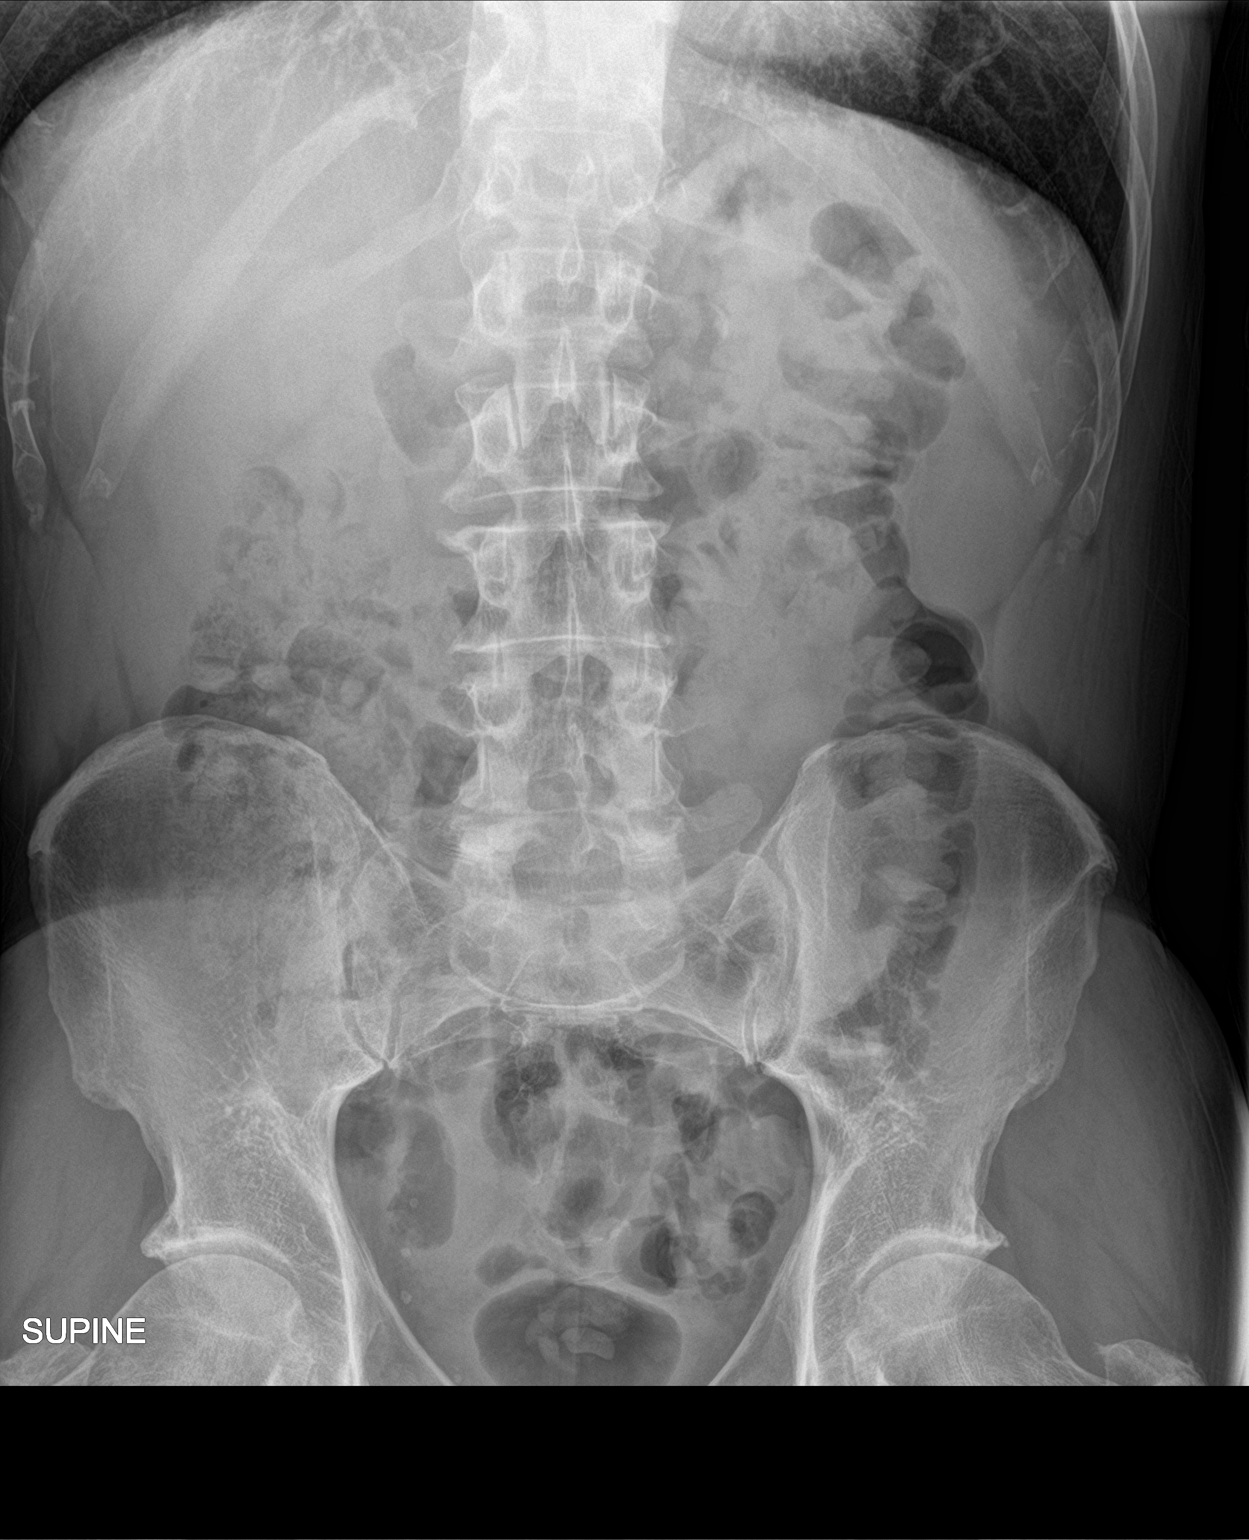

[2 of 2 positions shown; findings below may reference images not displayed]

FINDINGS: Examination demonstrates a nonobstructive bowel gas pattern with air
throughout the colon. No evidence of free peritoneal air. No mass or
mass effect. Remainder of the exam is unchanged.
IMPRESSION: Nonobstructive bowel gas pattern.

## 2019-11-14 ENCOUNTER — Emergency Department: Payer: Self-pay

## 2019-11-14 ENCOUNTER — Other Ambulatory Visit: Payer: Self-pay

## 2019-11-14 ENCOUNTER — Encounter: Payer: Self-pay | Admitting: Emergency Medicine

## 2019-11-14 ENCOUNTER — Emergency Department
Admission: EM | Admit: 2019-11-14 | Discharge: 2019-11-14 | Disposition: A | Discharge status: Home / Self Care | Payer: Self-pay | Attending: Emergency Medicine | Admitting: Emergency Medicine

## 2019-11-14 DIAGNOSIS — F17210 Nicotine dependence, cigarettes, uncomplicated: Secondary | ICD-10-CM | POA: Insufficient documentation

## 2019-11-14 DIAGNOSIS — Z79899 Other long term (current) drug therapy: Secondary | ICD-10-CM | POA: Insufficient documentation

## 2019-11-14 DIAGNOSIS — Z20828 Contact with and (suspected) exposure to other viral communicable diseases: Secondary | ICD-10-CM | POA: Insufficient documentation

## 2019-11-14 DIAGNOSIS — R0789 Other chest pain: Secondary | ICD-10-CM | POA: Insufficient documentation

## 2019-11-14 LAB — CBC
HCT: 44.9 % (ref 39.0–52.0)
Hemoglobin: 15.8 g/dL (ref 13.0–17.0)
MCH: 30.9 pg (ref 26.0–34.0)
MCHC: 35.2 g/dL (ref 30.0–36.0)
MCV: 87.7 fL (ref 80.0–100.0)
Platelets: 293 10*3/uL (ref 150–400)
RBC: 5.12 MIL/uL (ref 4.22–5.81)
RDW: 13.4 % (ref 11.5–15.5)
WBC: 8.5 10*3/uL (ref 4.0–10.5)
nRBC: 0 % (ref 0.0–0.2)

## 2019-11-14 LAB — SARS CORONAVIRUS 2 (TAT 6-24 HRS): SARS Coronavirus 2: NEGATIVE

## 2019-11-14 LAB — BASIC METABOLIC PANEL
Anion gap: 9 (ref 5–15)
BUN: 19 mg/dL (ref 6–20)
CO2: 27 mmol/L (ref 22–32)
Calcium: 8.9 mg/dL (ref 8.9–10.3)
Chloride: 105 mmol/L (ref 98–111)
Creatinine, Ser: 0.78 mg/dL (ref 0.61–1.24)
GFR calc Af Amer: >60 mL/min (ref >60)
GFR calc non Af Amer: >60 mL/min (ref >60)
Glucose, Bld: 116 mg/dL — ABNORMAL HIGH (ref 70–99)
Potassium: 3.5 mmol/L (ref 3.5–5.1)
Sodium: 141 mmol/L (ref 135–145)

## 2019-11-14 LAB — TROPONIN I (HIGH SENSITIVITY): Troponin I (High Sensitivity): 5 ng/L (ref <18)

## 2019-11-14 MED ORDER — PREDNISONE 50 MG PO TABS: 50.0000 mg | ORAL_TABLET | Freq: Every day | ORAL | 0 refills | Status: DC

## 2019-11-14 NOTE — ED Provider Notes (Signed)
[facility] Emergency Department Provider Note   ____________________________________________    I have reviewed the triage vital signs and the nursing notes.   HISTORY  Chief Complaint Chest Pain     HPI Corey House. is a 53 y.o. male who presents with chest discomfort, fatigue, mild cough.  Patient reports he has been coughing for about 1 week.  He denies myalgias or fevers.  He does smoke cigarettes daily states he has been smoking since he was a teenager.  He is concerned because he had some chest discomfort yesterday, now resolved.  He is very worried because his ex-wife sent in a plate of food for Thanksgiving which he ate and now is concerned that he may have been poisoned given fatigue, mild headache and worsening cough.  Past Medical History:  Diagnosis Date  . Urolithiasis   . Urolithiasis     There are no active problems to display for this patient.   Past Surgical History:  Procedure Laterality Date  . HERNIA REPAIR      Prior to Admission medications   Medication Sig Start Date End Date Taking? Authorizing Provider  albuterol (PROVENTIL HFA;VENTOLIN HFA) 108 (90 Base) MCG/ACT inhaler Inhale 2 puffs into the lungs every 6 (six) hours as needed. 06/30/18   Rebecka Apley, MD  amoxicillin (AMOXIL) 500 MG tablet Take 1 tablet (500 mg total) by mouth 3 (three) times daily with meals. 01/30/17   Hagler, Jami L, PA-C  ketorolac (TORADOL) 10 MG tablet Take 1 tablet (10 mg total) by mouth every 6 (six) hours as needed. 03/22/18   Darci Current, MD  lidocaine (XYLOCAINE) 2 % solution Use as directed 10 mLs in the mouth or throat every 4 (four) hours as needed for mouth pain. 01/30/17   Hagler, Jami L, PA-C  polyethylene glycol powder (GLYCOLAX/MIRALAX) powder 1 cap full in a full glass of water, two times a day for 3 days. 05/04/18   Sharman Cheek, MD  predniSONE (DELTASONE) 50 MG tablet Take 1 tablet (50 mg total) by mouth daily  with breakfast. 11/14/19   Jene Every, MD  senna-docusate (SENOKOT-S) 8.6-50 MG tablet Take 2 tablets by mouth 2 (two) times daily. 05/04/18   Sharman Cheek, MD     Allergies Patient has no known allergies.  No family history on file.  Social History Social History   Tobacco Use  . Smoking status: Current Every Day Smoker    Packs/day: 1.00    Types: Cigarettes  . Smokeless tobacco: Never Used  Substance Use Topics  . Alcohol use: No  . Drug use: Not on file    Review of Systems  Constitutional: No fever Eyes: No visual changes.  ENT: No throat pain Cardiovascular: Denies chest pain. Respiratory: Denies shortness of breath. Gastrointestinal: No abdominal pain.  No vomiting Genitourinary: Negative for dysuria. Musculoskeletal: Some myalgias Skin: Negative for rash. Neurological: Mild headache   ____________________________________________   PHYSICAL EXAM:  VITAL SIGNS: ED Triage Vitals  Enc Vitals Group     BP 11/14/19 1132 (!) 146/96     Pulse Rate 11/14/19 1132 90     Resp 11/14/19 1132 15     Temp 11/14/19 1132 98.2 F (36.8 C)     Temp Source 11/14/19 1132 Oral     SpO2 11/14/19 1132 100 %     Weight 11/14/19 1132 59 kg (130 lb)     Height 11/14/19 1132 1.702 m (5\' 7" )  Head Circumference --      Peak Flow --      Pain Score 11/14/19 1137 8     Pain Loc --      Pain Edu? --      Excl. in [place]? --     Constitutional: Alert and oriented.   Nose: No congestion/rhinnorhea. Mouth/Throat: Mucous membranes are moist.    Cardiovascular: Normal rate, regular rhythm. Grossly normal heart sounds.  Good peripheral circulation. Respiratory: Normal respiratory effort.  No retractions. Lungs CTAB. Gastrointestinal: Soft and nontender. No distention.    Musculoskeletal: No lower extremity tenderness nor edema.  Warm and well perfused Neurologic:  Normal speech and language. No gross focal neurologic deficits are appreciated.  Skin:  Skin is warm, dry  and intact. No rash noted. Psychiatric: Mood and affect are normal. Speech and behavior are normal.  ____________________________________________   LABS (all labs ordered are listed, but only abnormal results are displayed)  Labs Reviewed  BASIC METABOLIC PANEL - Abnormal; Notable for the following components:      Result Value   Glucose, Bld 116 (*)    All other components within normal limits  SARS CORONAVIRUS 2 (TAT 6-24 HRS)  CBC  TROPONIN I (HIGH SENSITIVITY)   ____________________________________________  EKG  ED ECG REPORT I, Lavonia Drafts, the attending physician, personally viewed and interpreted this ECG.  Date: 11/14/2019  Rhythm: normal sinus rhythm QRS Axis: normal Intervals: normal ST/T Wave abnormalities: normal Narrative Interpretation: no evidence of acute ischemia  ____________________________________________  RADIOLOGY  Chest x-ray unremarkable, consistent with COPD ____________________________________________   PROCEDURES  Procedure(s) performed: No  Procedures   Critical Care performed: No ____________________________________________   INITIAL IMPRESSION / ASSESSMENT AND PLAN / ED COURSE  Pertinent labs & imaging results that were available during my care of the patient were reviewed by me and considered in my medical decision making (see chart for details).  Patient presents with cough for over a week, does smoke cigarettes.  Afebrile, some myalgias and fatigue.  Also describes some chest discomfort yesterday, now resolved, seems to be related to coughing.  Differential includes COVID-19, COPD exacerbation, bronchitis, other viral illness.  Will check x-ray, labs  Lab work is reassuring, EKG is unremarkable, not consistent with ACS.  Chest x-ray consistent with COPD, lab work otherwise unremarkable.  We will start the patient on prednisone, smoking cessation discussion.  Outpatient follow-up as needed    ____________________________________________   FINAL CLINICAL IMPRESSION(S) / ED DIAGNOSES  Final diagnoses:  Atypical chest pain        Note:  This document was prepared using Dragon voice recognition software and may include unintentional dictation errors.   Lavonia Drafts, MD 11/14/19 1455

## 2019-11-14 NOTE — ED Triage Notes (Signed)
Mid chest pain since last night. Denies fall or injury. Denies pain with inspiration.

## 2019-11-14 NOTE — ED Triage Notes (Signed)
FIRST NURSE NOTE:  Pt c/o chest pain and feeling bad, pt also coughing on arrival, pt declined wheelchair. Pt able speak in complete sentences without difficulty.

## 2020-03-06 ENCOUNTER — Emergency Department
Admission: EM | Admit: 2020-03-06 | Discharge: 2020-03-06 | Disposition: A | Discharge status: Home / Self Care | Payer: Self-pay | Attending: Emergency Medicine | Admitting: Emergency Medicine

## 2020-03-06 ENCOUNTER — Other Ambulatory Visit: Payer: Self-pay

## 2020-03-06 ENCOUNTER — Emergency Department: Payer: Self-pay

## 2020-03-06 DIAGNOSIS — Y999 Unspecified external cause status: Secondary | ICD-10-CM | POA: Insufficient documentation

## 2020-03-06 DIAGNOSIS — W208XXA Other cause of strike by thrown, projected or falling object, initial encounter: Secondary | ICD-10-CM | POA: Insufficient documentation

## 2020-03-06 DIAGNOSIS — F17210 Nicotine dependence, cigarettes, uncomplicated: Secondary | ICD-10-CM | POA: Insufficient documentation

## 2020-03-06 DIAGNOSIS — Y929 Unspecified place or not applicable: Secondary | ICD-10-CM | POA: Insufficient documentation

## 2020-03-06 DIAGNOSIS — S92314A Nondisplaced fracture of first metatarsal bone, right foot, initial encounter for closed fracture: Secondary | ICD-10-CM | POA: Insufficient documentation

## 2020-03-06 DIAGNOSIS — Z79899 Other long term (current) drug therapy: Secondary | ICD-10-CM | POA: Insufficient documentation

## 2020-03-06 DIAGNOSIS — T1490XA Injury, unspecified, initial encounter: Secondary | ICD-10-CM

## 2020-03-06 DIAGNOSIS — Y939 Activity, unspecified: Secondary | ICD-10-CM | POA: Insufficient documentation

## 2020-03-06 MED ORDER — HYDROCODONE-ACETAMINOPHEN 5-325 MG PO TABS: 1.0000 | ORAL_TABLET | Freq: Four times a day (QID) | ORAL | 0 refills | Status: AC | PRN

## 2020-03-06 NOTE — ED Triage Notes (Signed)
Pt states a 30 pound weight dropped on right foot on Wednesday night injuring right foot. Pt with swelling and bruising noted to foot at this time, but cms is intact.

## 2020-03-06 NOTE — ED Provider Notes (Signed)
Emergency Department Provider Note  ____________________________________________  Time seen: Approximately 10:45 PM  I have reviewed the triage vital signs and the nursing notes.   HISTORY  Chief Complaint Foot Injury   Historian Patient     HPI Corey House. is a 54 y.o. male presents to the emergency department with acute right foot pain.  Patient states that he dropped a 30 weight on his foot 4 days ago and states that he has had worsening pain with ambulation.  He denies numbness and tingling of the right foot.  He has noticed ecchymosis around the toes and became concerned.  No alleviating measures have been attempted at home.  Past Medical History:  Diagnosis Date  . Urolithiasis   . Urolithiasis      Immunizations up to date:  Yes.     Past Medical History:  Diagnosis Date  . Urolithiasis   . Urolithiasis     There are no problems to display for this patient.   Past Surgical History:  Procedure Laterality Date  . HERNIA REPAIR      Prior to Admission medications   Medication Sig Start Date End Date Taking? Authorizing Provider  albuterol (PROVENTIL HFA;VENTOLIN HFA) 108 (90 Base) MCG/ACT inhaler Inhale 2 puffs into the lungs every 6 (six) hours as needed. 06/30/18   Rebecka Apley, MD  amoxicillin (AMOXIL) 500 MG tablet Take 1 tablet (500 mg total) by mouth 3 (three) times daily with meals. 01/30/17   Hagler, Jami L, PA-C  HYDROcodone-acetaminophen (NORCO) 5-325 MG tablet Take 1 tablet by mouth every 6 (six) hours as needed for up to 3 days for moderate pain. 03/06/20 03/09/20  Orvil Feil, PA-C  ketorolac (TORADOL) 10 MG tablet Take 1 tablet (10 mg total) by mouth every 6 (six) hours as needed. 03/22/18   Darci Current, MD  lidocaine (XYLOCAINE) 2 % solution Use as directed 10 mLs in the mouth or throat every 4 (four) hours as needed for mouth pain. 01/30/17   Hagler, Jami L, PA-C  polyethylene glycol powder (GLYCOLAX/MIRALAX) powder 1 cap  full in a full glass of water, two times a day for 3 days. 05/04/18   Sharman Cheek, MD  predniSONE (DELTASONE) 50 MG tablet Take 1 tablet (50 mg total) by mouth daily with breakfast. 11/14/19   Jene Every, MD  senna-docusate (SENOKOT-S) 8.6-50 MG tablet Take 2 tablets by mouth 2 (two) times daily. 05/04/18   Sharman Cheek, MD    Allergies Patient has no known allergies.  No family history on file.  Social History Social History   Tobacco Use  . Smoking status: Current Every Day Smoker    Packs/day: 1.00    Types: Cigarettes  . Smokeless tobacco: Never Used  Substance Use Topics  . Alcohol use: No  . Drug use: Not on file     Review of Systems  Constitutional: No fever/chills Eyes:  No discharge ENT: No upper respiratory complaints. Respiratory: no cough. No SOB/ use of accessory muscles to breath Gastrointestinal:   No nausea, no vomiting.  No diarrhea.  No constipation. Musculoskeletal: Patient has right foot pain.  Skin: Negative for rash, abrasions, lacerations, ecchymosis.    ____________________________________________   PHYSICAL EXAM:  VITAL SIGNS: ED Triage Vitals [03/06/20 1944]  Enc Vitals Group     BP (!) 139/106     Pulse Rate 90     Resp 16     Temp 98.2 F (36.8 C)     Temp Source  Oral     SpO2 100 %     Weight 130 lb (59 kg)     Height 5\' 8"  (1.727 m)     Head Circumference      Peak Flow      Pain Score      Pain Loc      Pain Edu?      Excl. in [place]?      Constitutional: Alert and oriented. Well appearing and in no acute distress. Eyes: Conjunctivae are normal. PERRL. EOMI. Head: Atraumatic. Cardiovascular: Normal rate, regular rhythm. Normal S1 and S2.  Good peripheral circulation. Respiratory: Normal respiratory effort without tachypnea or retractions. Lungs CTAB. Good air entry to the bases with no decreased or absent breath sounds Gastrointestinal: Bowel sounds x 4 quadrants. Soft and nontender to palpation. No guarding or  rigidity. No distention. Musculoskeletal: Full range of motion to all extremities.  Patient has tenderness to palpation over right first metatarsal. Neurologic:  Normal for age. No gross focal neurologic deficits are appreciated.  Skin:  Skin is warm, dry and intact. No rash noted.  Patient has ecchymosis of skin overlying distal metatarsals. Psychiatric: Mood and affect are normal for age. Speech and behavior are normal.   ____________________________________________   LABS (all labs ordered are listed, but only abnormal results are displayed)  Labs Reviewed - No data to display ____________________________________________  EKG   ____________________________________________  RADIOLOGY Unk Pinto, personally viewed and evaluated these images (plain radiographs) as part of my medical decision making, as well as reviewing the written report by the radiologist.  DG Foot Complete Right  Result Date: 03/06/2020 CLINICAL DATA:  Pain EXAM: RIGHT FOOT COMPLETE - 3+ VIEW COMPARISON:  None. FINDINGS: There is an acute transverse minimally displaced fracture through the first metatarsal. There is surrounding soft tissue swelling. IMPRESSION: Acute transverse minimally displaced fracture through the first metatarsal. Electronically Signed   By: Constance Holster M.D.   On: 03/06/2020 20:08    ____________________________________________    PROCEDURES  Procedure(s) performed:     Procedures     Medications - No data to display   ____________________________________________   INITIAL IMPRESSION / ASSESSMENT AND PLAN / ED COURSE  Pertinent labs & imaging results that were available during my care of the patient were reviewed by me and considered in my medical decision making (see chart for details).      Assessment and plan Metatarsal fracture 54 year old male presents to the emergency department with acute right foot pain after he dropped a heavy weight on his foot  earlier in the week.  Patient sustained a minimally displaced right metatarsal fracture.  Patient was placed in a postop shoe and Norco was prescribed for pain.  He was advised to follow-up with podiatry as needed.  All patient questions were answered.   ____________________________________________  FINAL CLINICAL IMPRESSION(S) / ED DIAGNOSES  Final diagnoses:  Injury  Closed nondisplaced fracture of first metatarsal bone of right foot, initial encounter      NEW MEDICATIONS STARTED DURING THIS VISIT:  ED Discharge Orders         Ordered    HYDROcodone-acetaminophen ([place]) 5-325 MG tablet  Every 6 hours PRN     03/06/20 2239              This chart was dictated using voice recognition software/Dragon. Despite best efforts to proofread, errors can occur which can change the meaning. Any change was purely unintentional.     Lannie Fields,  PA-C 03/06/20 2249    Minna Antis, MD 03/06/20 2312

## 2020-03-06 NOTE — ED Notes (Signed)
See triage note, pt states he dropped a 30 lb weight on his right foot on Wednesday. Toes and top of foot are noted to have purple bruising and swelling.  Normal cap refill

## 2020-04-16 ENCOUNTER — Emergency Department: Payer: Self-pay

## 2020-04-16 ENCOUNTER — Emergency Department
Admission: EM | Admit: 2020-04-16 | Discharge: 2020-04-16 | Disposition: A | Discharge status: Home / Self Care | Payer: Self-pay | Attending: Emergency Medicine | Admitting: Emergency Medicine

## 2020-04-16 ENCOUNTER — Other Ambulatory Visit: Payer: Self-pay

## 2020-04-16 DIAGNOSIS — R0789 Other chest pain: Secondary | ICD-10-CM | POA: Insufficient documentation

## 2020-04-16 DIAGNOSIS — F17210 Nicotine dependence, cigarettes, uncomplicated: Secondary | ICD-10-CM | POA: Insufficient documentation

## 2020-04-16 DIAGNOSIS — R079 Chest pain, unspecified: Secondary | ICD-10-CM

## 2020-04-16 LAB — BASIC METABOLIC PANEL
Anion gap: 5 (ref 5–15)
BUN: 14 mg/dL (ref 6–20)
CO2: 31 mmol/L (ref 22–32)
Calcium: 9.2 mg/dL (ref 8.9–10.3)
Chloride: 102 mmol/L (ref 98–111)
Creatinine, Ser: 0.66 mg/dL (ref 0.61–1.24)
GFR calc Af Amer: >60 mL/min (ref >60)
GFR calc non Af Amer: >60 mL/min (ref >60)
Glucose, Bld: 92 mg/dL (ref 70–99)
Potassium: 4 mmol/L (ref 3.5–5.1)
Sodium: 138 mmol/L (ref 135–145)

## 2020-04-16 LAB — TROPONIN I (HIGH SENSITIVITY): Troponin I (High Sensitivity): 6 ng/L (ref <18)

## 2020-04-16 LAB — CBC
HCT: 43.1 % (ref 39.0–52.0)
Hemoglobin: 15 g/dL (ref 13.0–17.0)
MCH: 30.5 pg (ref 26.0–34.0)
MCHC: 34.8 g/dL (ref 30.0–36.0)
MCV: 87.8 fL (ref 80.0–100.0)
Platelets: 338 10*3/uL (ref 150–400)
RBC: 4.91 MIL/uL (ref 4.22–5.81)
RDW: 13.7 % (ref 11.5–15.5)
WBC: 5.7 10*3/uL (ref 4.0–10.5)
nRBC: 0 % (ref 0.0–0.2)

## 2020-04-16 NOTE — ED Provider Notes (Signed)
[facility] Emergency Department Provider Note  ____________________________________________   I have reviewed the triage vital signs and the nursing notes.   HISTORY  Chief Complaint Chest Pain   History limited by: Not Limited   HPI Corey House. is a 54 y.o. male who presents to the emergency department today because of concerns for chest pain.  He states that he has had similar chest pain for the past few years.  He has been evaluated for in the past.  Is located on the left side and will be sharp.  He states he will have very intermittent episodes and then might go a few hours without having any further pain.  Today he has more radiation of the pain to his central chest.  He states he was worried that he was having a heart attack.  He denies any associated shortness of breath.  No sweating.  No nausea or vomiting.   Records reviewed. Per medical record review patient has a history of ER visits for chest pain in the past.   Past Medical History:  Diagnosis Date  . Urolithiasis   . Urolithiasis     There are no problems to display for this patient.   Past Surgical History:  Procedure Laterality Date  . HERNIA REPAIR      Prior to Admission medications   Medication Sig Start Date End Date Taking? Authorizing Provider  albuterol (PROVENTIL HFA;VENTOLIN HFA) 108 (90 Base) MCG/ACT inhaler Inhale 2 puffs into the lungs every 6 (six) hours as needed. 06/30/18   Rebecka Apley, MD  amoxicillin (AMOXIL) 500 MG tablet Take 1 tablet (500 mg total) by mouth 3 (three) times daily with meals. 01/30/17   Hagler, Jami L, PA-C  ketorolac (TORADOL) 10 MG tablet Take 1 tablet (10 mg total) by mouth every 6 (six) hours as needed. 03/22/18   Darci Current, MD  lidocaine (XYLOCAINE) 2 % solution Use as directed 10 mLs in the mouth or throat every 4 (four) hours as needed for mouth pain. 01/30/17   Hagler, Jami L, PA-C  polyethylene glycol powder  (GLYCOLAX/MIRALAX) powder 1 cap full in a full glass of water, two times a day for 3 days. 05/04/18   Sharman Cheek, MD  predniSONE (DELTASONE) 50 MG tablet Take 1 tablet (50 mg total) by mouth daily with breakfast. 11/14/19   Jene Every, MD  senna-docusate (SENOKOT-S) 8.6-50 MG tablet Take 2 tablets by mouth 2 (two) times daily. 05/04/18   Sharman Cheek, MD    Allergies Patient has no known allergies.  No family history on file.  Social History Social History   Tobacco Use  . Smoking status: Current Every Day Smoker    Packs/day: 1.00    Types: Cigarettes  . Smokeless tobacco: Never Used  Substance Use Topics  . Alcohol use: No  . Drug use: Not on file    Review of Systems Constitutional: No fever/chills Eyes: No visual changes. ENT: No sore throat. Cardiovascular: Positive for chest pain. Respiratory: Denies shortness of breath. Gastrointestinal: No abdominal pain.  No nausea, no vomiting.  No diarrhea.   Genitourinary: Negative for dysuria. Musculoskeletal: Negative for back pain. Skin: Negative for rash. Neurological: Negative for headaches, focal weakness or numbness.  ____________________________________________   PHYSICAL EXAM:  VITAL SIGNS: ED Triage Vitals [04/16/20 2027]  Enc Vitals Group     BP (!) 151/100     Pulse Rate 72     Resp 18     Temp  98.2 F (36.8 C)     Temp src      SpO2 96 %     Weight 130 lb 1.1 oz (59 kg)     Height 5\' 8"  (1.727 m)     Head Circumference      Peak Flow      Pain Score 3   Constitutional: Alert and oriented.  Eyes: Conjunctivae are normal.  ENT      Head: Normocephalic and atraumatic.      Nose: No congestion/rhinnorhea.      Mouth/Throat: Mucous membranes are moist.      Neck: No stridor. Hematological/Lymphatic/Immunilogical: No cervical lymphadenopathy. Cardiovascular: Normal rate, regular rhythm.  No murmurs, rubs, or gallops.  Respiratory: Normal respiratory effort without tachypnea nor  retractions. Breath sounds are clear and equal bilaterally. No wheezes/rales/rhonchi. Gastrointestinal: Soft and non tender. No rebound. No guarding.  Genitourinary: Deferred Musculoskeletal: Normal range of motion in all extremities. No lower extremity edema. Neurologic:  Normal speech and language. No gross focal neurologic deficits are appreciated.  Skin:  Skin is warm, dry and intact. No rash noted. Psychiatric: Mood and affect are normal. Speech and behavior are normal. Patient exhibits appropriate insight and judgment.  ____________________________________________    LABS (pertinent positives/negatives)  Trop hs 6 CBC wbc 5.7, hgb 15.0, plt 338 BMP wnl  ____________________________________________   EKG  I, Nance Pear, attending physician, personally viewed and interpreted this EKG  EKG Time: 2029 Rate: 71 Rhythm: normal sinus rhythm Axis: normal Intervals: qtc 430 QRS: narrow, LVH ST changes: no st elevation Impression: abnormal ekg   ____________________________________________    RADIOLOGY  CXR No acute abnormality  ____________________________________________   PROCEDURES  Procedures  ____________________________________________   INITIAL IMPRESSION / ASSESSMENT AND PLAN / ED COURSE  Pertinent labs & imaging results that were available during my care of the patient were reviewed by me and considered in my medical decision making (see chart for details).   Patient presented to the emergency department today because of concerns for chest pain.  Patient has been evaluated for chest pain in the emergency department in the past.  Differential would include pneumonia, pneumothorax, PE, dissection, ACS, bronchitis, costochondritis amongst other etiologies.  Blood work test and EKG without any concerning findings.  Given intermittent nature I doubt ACS with negative troponin.  Additionally I doubt PE or dissection given clinical history.  This point do  wonder if partly patient has some anxiety related issues.  He states he does have depression.  Discussed with patient importance of following up with primary care. ____________________________________________   FINAL CLINICAL IMPRESSION(S) / ED DIAGNOSES  Final diagnoses:  Nonspecific chest pain     Note: This dictation was prepared with Dragon dictation. Any transcriptional errors that result from this process are unintentional     Nance Pear, MD 04/16/20 2352

## 2020-04-16 NOTE — ED Triage Notes (Signed)
Patient coming [facility] for chest pain for a couple months. Patient also c/o body aches and chills. Patient also has new stressors - mother died 1 month ago.  EMS vitals: 12 lead sinus rhythm, HR 94, CBG 154, 154/94, afebrile.   324 mg aspirin given in transport.

## 2020-04-16 NOTE — Discharge Instructions (Addendum)
Please seek medical attention for any high fevers, chest pain, shortness of breath, change in behavior, persistent vomiting, bloody stool or any other new or concerning symptoms.  

## 2020-07-05 ENCOUNTER — Telehealth: Payer: Self-pay | Admitting: General Practice

## 2020-07-05 NOTE — Telephone Encounter (Signed)
Individual has been contacted 3+ times regarding ED referral. No further attempts to contact individual will be made. 

## 2021-03-19 ENCOUNTER — Emergency Department: Payer: Self-pay

## 2021-03-19 ENCOUNTER — Other Ambulatory Visit: Payer: Self-pay

## 2021-03-19 ENCOUNTER — Emergency Department
Admission: EM | Admit: 2021-03-19 | Discharge: 2021-03-19 | Disposition: A | Discharge status: Home / Self Care | Payer: Self-pay | Attending: Emergency Medicine | Admitting: Emergency Medicine

## 2021-03-19 DIAGNOSIS — H53133 Sudden visual loss, bilateral: Secondary | ICD-10-CM | POA: Insufficient documentation

## 2021-03-19 DIAGNOSIS — F17210 Nicotine dependence, cigarettes, uncomplicated: Secondary | ICD-10-CM | POA: Insufficient documentation

## 2021-03-19 DIAGNOSIS — H543 Unqualified visual loss, both eyes: Secondary | ICD-10-CM

## 2021-03-19 LAB — CBC WITH DIFFERENTIAL/PLATELET
Abs Immature Granulocytes: 0.03 10*3/uL (ref 0.00–0.07)
Basophils Absolute: 0 10*3/uL (ref 0.0–0.1)
Basophils Relative: 1 %
Eosinophils Absolute: 0.3 10*3/uL (ref 0.0–0.5)
Eosinophils Relative: 5 %
HCT: 44.8 % (ref 39.0–52.0)
Hemoglobin: 15.2 g/dL (ref 13.0–17.0)
Immature Granulocytes: 1 %
Lymphocytes Relative: 28 %
Lymphs Abs: 1.7 10*3/uL (ref 0.7–4.0)
MCH: 31 pg (ref 26.0–34.0)
MCHC: 33.9 g/dL (ref 30.0–36.0)
MCV: 91.2 fL (ref 80.0–100.0)
Monocytes Absolute: 0.3 10*3/uL (ref 0.1–1.0)
Monocytes Relative: 5 %
Neutro Abs: 3.8 10*3/uL (ref 1.7–7.7)
Neutrophils Relative %: 60 %
Platelets: 210 10*3/uL (ref 150–400)
RBC: 4.91 MIL/uL (ref 4.22–5.81)
RDW: 13 % (ref 11.5–15.5)
WBC: 6.1 10*3/uL (ref 4.0–10.5)
nRBC: 0 % (ref 0.0–0.2)

## 2021-03-19 LAB — HEPATIC FUNCTION PANEL
ALT: 14 U/L (ref 0–44)
AST: 20 U/L (ref 15–41)
Albumin: 3.9 g/dL (ref 3.5–5.0)
Alkaline Phosphatase: 57 U/L (ref 38–126)
Bilirubin, Direct: 0.1 mg/dL (ref 0.0–0.2)
Indirect Bilirubin: 0.3 mg/dL (ref 0.3–0.9)
Total Bilirubin: 0.4 mg/dL (ref 0.3–1.2)
Total Protein: 6.5 g/dL (ref 6.5–8.1)

## 2021-03-19 LAB — BASIC METABOLIC PANEL
Anion gap: 8 (ref 5–15)
BUN: 20 mg/dL (ref 6–20)
CO2: 28 mmol/L (ref 22–32)
Calcium: 8.8 mg/dL — ABNORMAL LOW (ref 8.9–10.3)
Chloride: 102 mmol/L (ref 98–111)
Creatinine, Ser: 0.81 mg/dL (ref 0.61–1.24)
GFR, Estimated: >60 mL/min (ref >60)
Glucose, Bld: 109 mg/dL — ABNORMAL HIGH (ref 70–99)
Potassium: 3.7 mmol/L (ref 3.5–5.1)
Sodium: 138 mmol/L (ref 135–145)

## 2021-03-19 MED ORDER — TETRACAINE HCL 0.5 % OP SOLN
1.0000 [drp] | Freq: Once | OPHTHALMIC | Status: AC
  Administered 2021-03-19: 1 [drp] via OPHTHALMIC
  Filled 2021-03-19: qty 4

## 2021-03-19 MED ORDER — FLUORESCEIN SODIUM 1 MG OP STRP
1.0000 | ORAL_STRIP | Freq: Once | OPHTHALMIC | Status: AC
  Administered 2021-03-19: 1 via OPHTHALMIC
  Filled 2021-03-19: qty 1

## 2021-03-19 NOTE — ED Provider Notes (Signed)
[facility] Emergency Department Provider Note ____________________________________________   Event Date/Time   First MD Initiated Contact with Patient 03/19/21 [id number]     (approximate)  I have reviewed the triage vital signs and the nursing notes.   HISTORY  Chief Complaint Blurred Vision    HPI Corey House. is a 55 y.o. male with PMH of kidney stones and hernia (but no active medical problems for which he takes any medication) who presents with bilateral blurred vision over the last week and a half.  He states that it is somewhat worse in the left eye but affecting both eyes.  He senses a haziness especially in the medial parts of both eyes.  There is no eye pain, irritation, or discharge, but he has noticed increased tearing out of the left eye.  He denies any eye trauma.  He has no headache.  He denies any weakness or numbness.  He states that he has had some difficulty with balance and gait although this has been present for over a year and not recently changed.  Past Medical History:  Diagnosis Date  . Urolithiasis   . Urolithiasis     There are no problems to display for this patient.   Past Surgical History:  Procedure Laterality Date  . HERNIA REPAIR      Prior to Admission medications   Medication Sig Start Date End Date Taking? Authorizing Provider  albuterol (PROVENTIL HFA;VENTOLIN HFA) 108 (90 Base) MCG/ACT inhaler Inhale 2 puffs into the lungs every 6 (six) hours as needed. 06/30/18   Rebecka Apley, MD  amoxicillin (AMOXIL) 500 MG tablet Take 1 tablet (500 mg total) by mouth 3 (three) times daily with meals. 01/30/17   Hagler, Jami L, PA-C  ketorolac (TORADOL) 10 MG tablet Take 1 tablet (10 mg total) by mouth every 6 (six) hours as needed. 03/22/18   Darci Current, MD  lidocaine (XYLOCAINE) 2 % solution Use as directed 10 mLs in the mouth or throat every 4 (four) hours as needed for mouth pain. 01/30/17   Hagler, Jami L, PA-C   polyethylene glycol powder (GLYCOLAX/MIRALAX) powder 1 cap full in a full glass of water, two times a day for 3 days. 05/04/18   Sharman Cheek, MD  predniSONE (DELTASONE) 50 MG tablet Take 1 tablet (50 mg total) by mouth daily with breakfast. 11/14/19   Jene Every, MD  senna-docusate (SENOKOT-S) 8.6-50 MG tablet Take 2 tablets by mouth 2 (two) times daily. 05/04/18   Sharman Cheek, MD    Allergies Patient has no known allergies.  History reviewed. No pertinent family history.  Social History Social History   Tobacco Use  . Smoking status: Current Every Day Smoker    Packs/day: 1.00    Types: Cigarettes  . Smokeless tobacco: Never Used  Substance Use Topics  . Alcohol use: No    Review of Systems  Constitutional: No fever. Eyes: Positive for blurred vision. ENT: No sore throat. Cardiovascular: Denies chest pain. Respiratory: Denies shortness of breath. Gastrointestinal: No vomiting. Genitourinary: Negative for dysuria.  Musculoskeletal: Negative for back pain. Skin: Negative for rash. Neurological: Negative for headaches, focal weakness or numbness.   ____________________________________________   PHYSICAL EXAM:  VITAL SIGNS: ED Triage Vitals  Enc Vitals Group     BP 03/19/21 0012 (!) 134/99     Pulse Rate 03/19/21 0012 90     Resp 03/19/21 0012 18     Temp 03/19/21 0012 98.6 F (37 C)  Temp Source 03/19/21 0012 Oral     SpO2 03/19/21 0012 97 %     Weight 03/19/21 0015 130 lb (59 kg)     Height 03/19/21 0015 5\' 8"  (1.727 m)     Head Circumference --      Peak Flow --      Pain Score 03/19/21 0012 0     Pain Loc --      Pain Edu? --      Excl. in GC? --     Constitutional: Alert and oriented. Well appearing and in no acute distress. Eyes: Conjunctivae are normal.  EOMI.  PERRLA.  No discharge. Head: Atraumatic. Nose: No congestion/rhinnorhea. Mouth/Throat: Mucous membranes are moist.   Neck: Normal range of motion.  Cardiovascular:  Normal rate, regular rhythm.  Good peripheral circulation. Respiratory: Normal respiratory effort.  No retractions.  Gastrointestinal: No distention.  Musculoskeletal:  Extremities warm and well perfused.  Neurologic:  Normal speech and language.  Motor and sensory intact in all extremities.  Normal gait.  Normal coordination with no ataxia on finger-to-nose.  No pronator drift.  No facial droop.   Skin:  Skin is warm and dry. No rash noted. Psychiatric: Mood and affect are normal. Speech and behavior are normal.  ____________________________________________   LABS (all labs ordered are listed, but only abnormal results are displayed)  Labs Reviewed  BASIC METABOLIC PANEL - Abnormal; Notable for the following components:      Result Value   Glucose, Bld 109 (*)    Calcium 8.8 (*)    All other components within normal limits  HEPATIC FUNCTION PANEL  CBC WITH DIFFERENTIAL/PLATELET   ____________________________________________  EKG   ____________________________________________  RADIOLOGY    ____________________________________________   PROCEDURES  Procedure(s) performed: No  Procedures  Critical Care performed: No ____________________________________________   INITIAL IMPRESSION / ASSESSMENT AND PLAN / ED COURSE  Pertinent labs & imaging results that were available during my care of the patient were reviewed by me and considered in my medical decision making (see chart for details).  55 year old male with no active medical problems presents with bilateral blurred vision over the last 1.5 weeks with no associated eye pain, irritation, redness, or discharge.  He incidentally reports nonspecific difficulty with balance although this has been present for over a year.  On exam, the patient is overall relatively well-appearing.  His vital signs are normal.  The physical exam is unremarkable.  External eye exam is unremarkable with no conjunctival injection, discharge,  and normal reactive pupils.  I performed fluorescein exam which shows no corneal abrasions or other visible corneal abnormalities.  Differential is broad but includes new onset hyperglycemia/diabetes or other systemic etiology, or less likely possible CNS cause such as pituitary mass.  Cataracts are possible but I have a low suspicion for intraocular cause given the bilateral nature of the symptoms.  There is no clinical evidence for retinal detachment, vitreous hemorrhage or detachment, or acute corneal abnormality.  We will obtain basic labs, CT head, and discuss with ophthalmology.  ----------------------------------------- 3:37 AM on 03/19/2021 -----------------------------------------  Lab work-up is unremarkable.  The glucose is normal.  CT head also shows no acute abnormalities.  I consulted Dr. 05/19/2021 from ophthalmology.  He agreed with the current work-up and management.  He advises that if this work-up is negative, the patient can follow-up in his office in the next several days.  I counseled the patient on the results of the work-up.  He agrees with the follow-up plan.  He states that he does not have insurance at this time but understands the importance of follow-up and states that he will contact the ophthalmology office and try to obtain funds to make sure that he can follow-up.  I gave him thorough return precautions and he expressed understanding.  ____________________________________________   FINAL CLINICAL IMPRESSION(S) / ED DIAGNOSES  Final diagnoses:  Vision loss, bilateral      NEW MEDICATIONS STARTED DURING THIS VISIT:  New Prescriptions   No medications on file     Note:  This document was prepared using Dragon voice recognition software and may include unintentional dictation errors.    Dionne Bucy, MD 03/19/21 [id number]

## 2021-03-19 NOTE — Discharge Instructions (Addendum)
It is very important that he follow-up with an eye doctor within the next several days.  We have provided a referral to one of our affiliated eye doctors.  You should call on Monday to schedule follow-up.  Mention that you were in the ER.  In the meantime, you should return to the ER for new or worsening blurred vision, eye pain, redness, swelling, severe headache, difficulty speaking or walking, weakness or numbness, or any other new or worsening symptoms that concern you.

## 2021-03-19 NOTE — ED Triage Notes (Signed)
Pt presents to ER c/o blurred vision x1 week.  Pt able to see how many fingers this RN is holding up.  Pt denies any other neuro deficits, but states his gait has been getting progressively worse for last several months.  Pt A&O x4 at this time.  No slurred speech noted.

## 2021-04-27 IMAGING — CR DG FOOT COMPLETE 3+V*R*
1 series · 3 of 3 positions shown · non-contrast
Comparison: None.

CLINICAL DATA: Pain

EXAM:
RIGHT FOOT COMPLETE - 3+ VIEW

[Series 1: x foot ap right · 0.14mm/px · 3 of 3 slices shown]
[im 1/3]
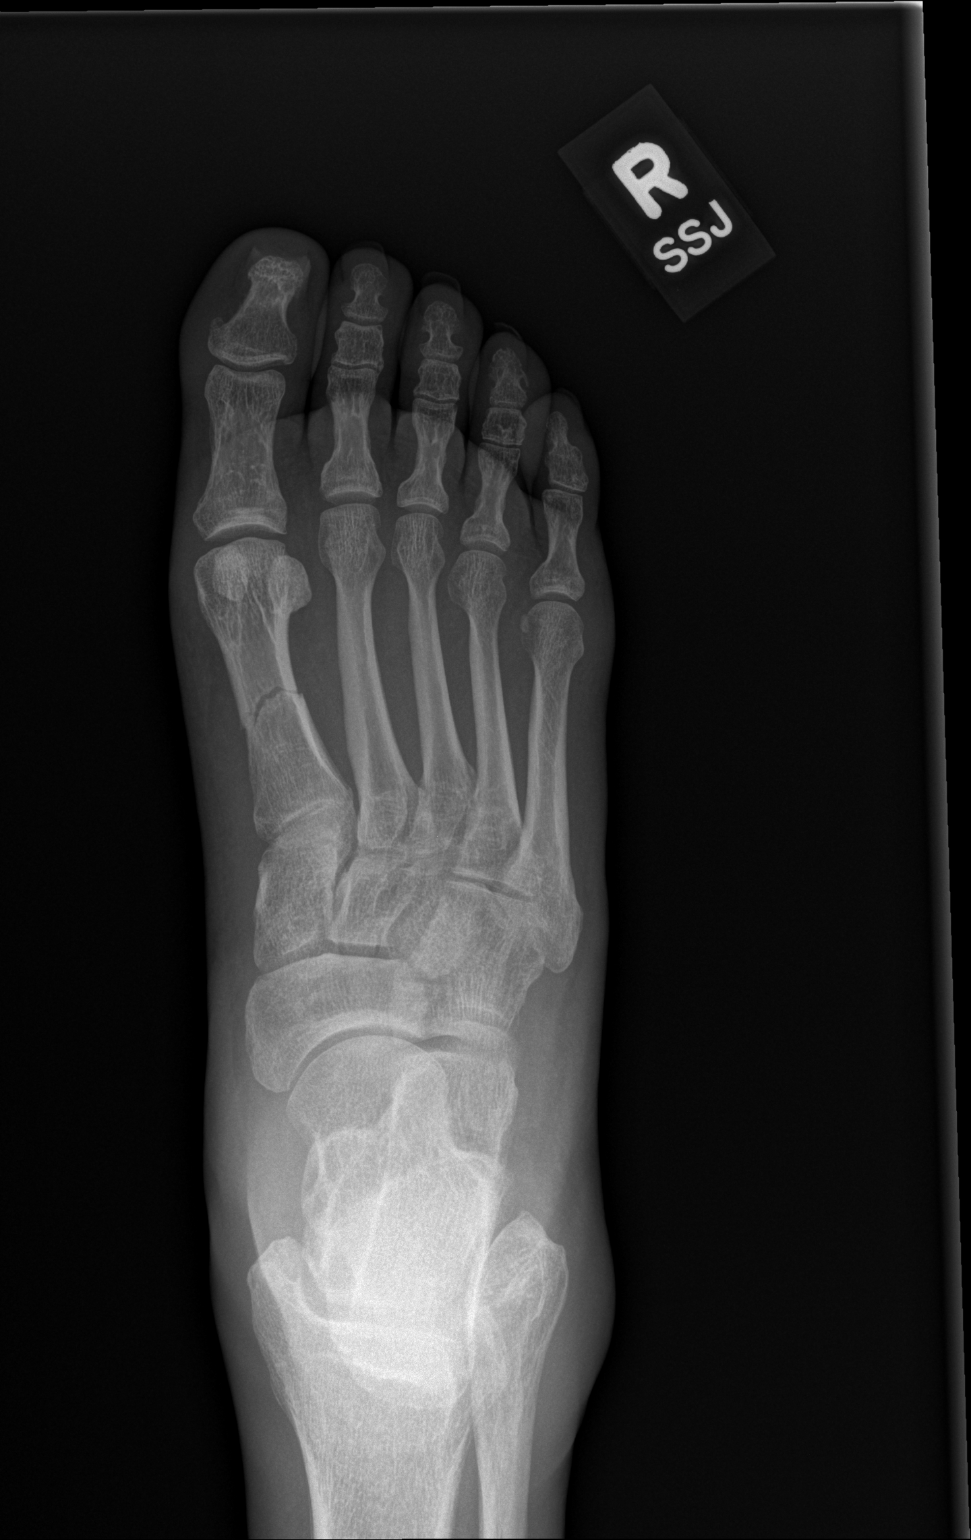
[im 2/3]
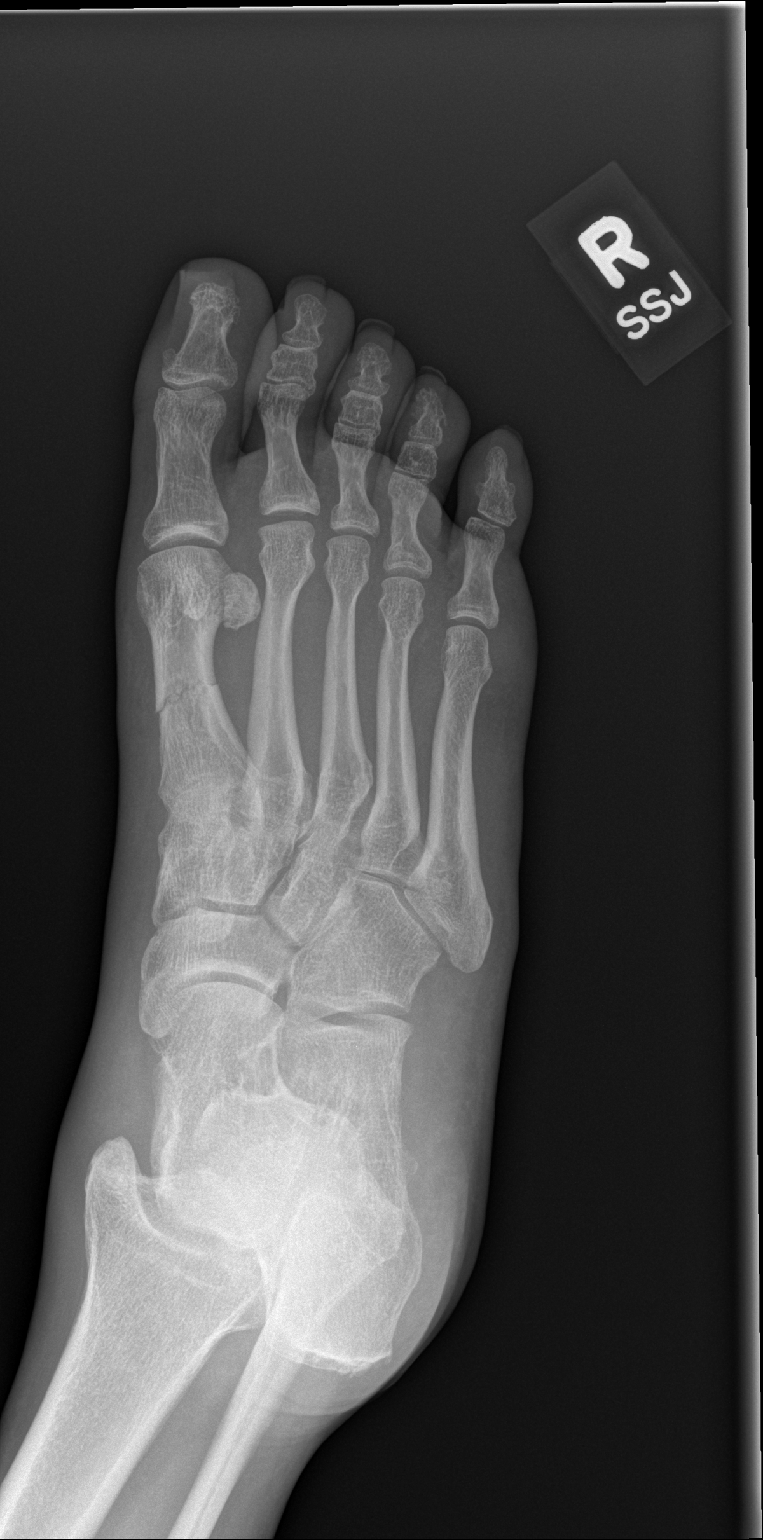
[im 3/3]
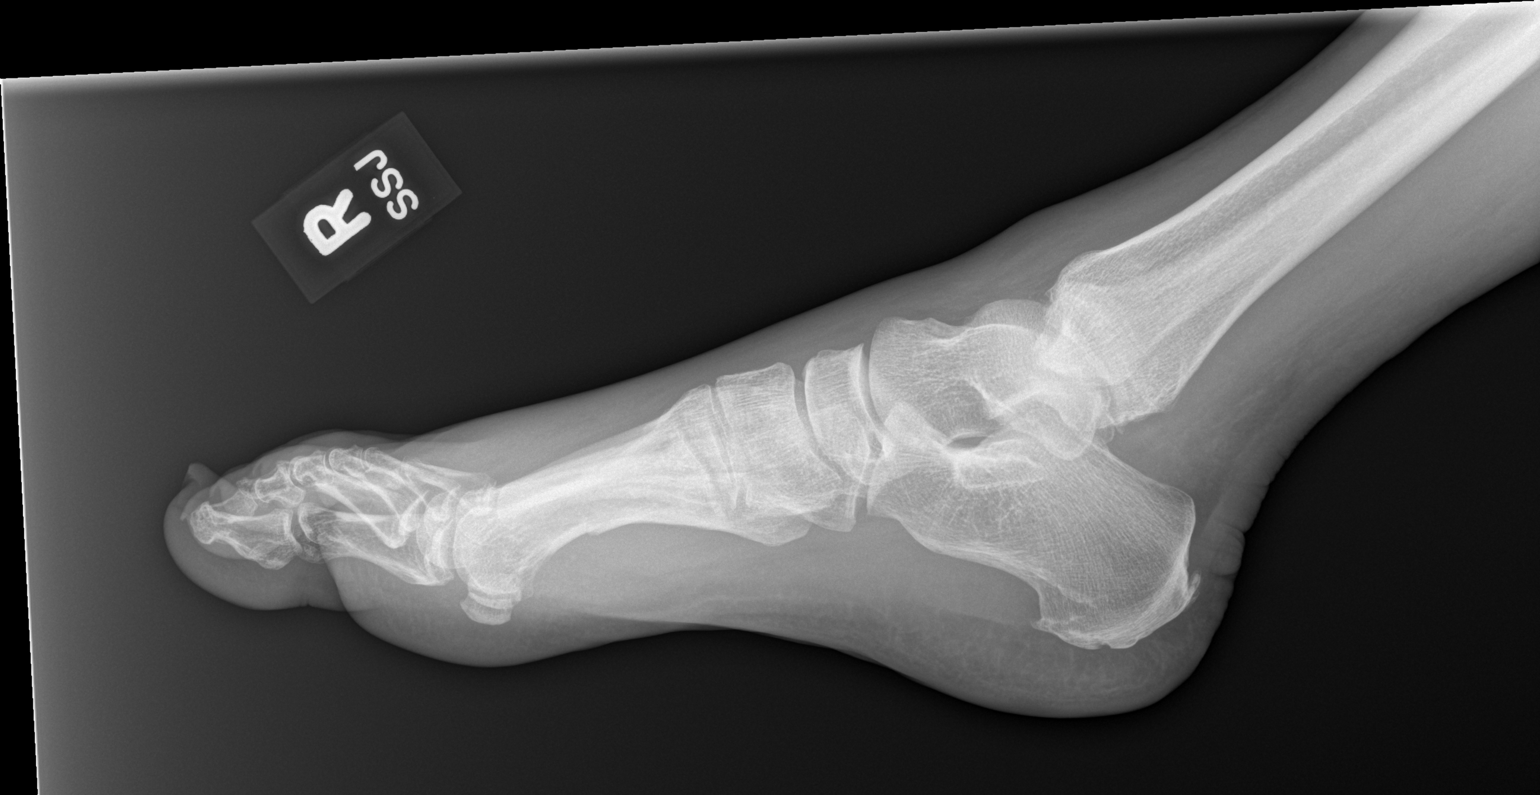

[3 of 3 positions shown; findings below may reference images not displayed]

FINDINGS: There is an acute transverse minimally displaced fracture through
the first metatarsal. There is surrounding soft tissue swelling.
IMPRESSION: Acute transverse minimally displaced fracture through the first
metatarsal.

## 2021-08-02 ENCOUNTER — Encounter: Payer: Self-pay | Admitting: Ophthalmology

## 2021-08-09 ENCOUNTER — Encounter: Payer: Self-pay | Admitting: Ophthalmology

## 2021-08-09 ENCOUNTER — Ambulatory Visit: Payer: Self-pay | Admitting: Anesthesiology

## 2021-08-09 ENCOUNTER — Other Ambulatory Visit: Payer: Self-pay

## 2021-08-09 ENCOUNTER — Ambulatory Visit
Admission: RE | Admit: 2021-08-09 | Discharge: 2021-08-09 | Disposition: A | Discharge status: Home / Self Care | Payer: Self-pay | Attending: Ophthalmology | Admitting: Ophthalmology

## 2021-08-09 ENCOUNTER — Encounter
Admission: RE | Disposition: A | Discharge status: Home / Self Care | Payer: Self-pay | Source: Home / Self Care | Attending: Ophthalmology

## 2021-08-09 DIAGNOSIS — Z79899 Other long term (current) drug therapy: Secondary | ICD-10-CM | POA: Insufficient documentation

## 2021-08-09 DIAGNOSIS — H2512 Age-related nuclear cataract, left eye: Secondary | ICD-10-CM | POA: Insufficient documentation

## 2021-08-09 DIAGNOSIS — F17200 Nicotine dependence, unspecified, uncomplicated: Secondary | ICD-10-CM | POA: Insufficient documentation

## 2021-08-09 HISTORY — DX: Chronic cough: R05.3

## 2021-08-09 HISTORY — PX: CATARACT EXTRACTION W/PHACO: SHX586

## 2021-08-09 SURGERY — PHACOEMULSIFICATION, CATARACT, WITH IOL INSERTION
Anesthesia: Monitor Anesthesia Care | Site: Eye | Laterality: Left

## 2021-08-09 MED ORDER — SIGHTPATH DOSE#1 BSS IO SOLN
INTRAOCULAR | Status: DC | PRN
  Administered 2021-08-09: 43 mL via OPHTHALMIC

## 2021-08-09 MED ORDER — SIGHTPATH DOSE#1 NA HYALUR & NA CHOND-NA HYALUR IO KIT
PACK | INTRAOCULAR | Status: DC | PRN
  Administered 2021-08-09: 1 via OPHTHALMIC

## 2021-08-09 MED ORDER — LACTATED RINGERS IV SOLN: INTRAVENOUS | Status: DC

## 2021-08-09 MED ORDER — ACETAMINOPHEN 325 MG PO TABS: 325.0000 mg | ORAL_TABLET | ORAL | Status: DC | PRN

## 2021-08-09 MED ORDER — ACETAMINOPHEN 160 MG/5ML PO SOLN: 325.0000 mg | ORAL | Status: DC | PRN

## 2021-08-09 MED ORDER — FENTANYL CITRATE (PF) 100 MCG/2ML IJ SOLN
INTRAMUSCULAR | Status: DC | PRN
  Administered 2021-08-09: 100 ug via INTRAVENOUS

## 2021-08-09 MED ORDER — MIDAZOLAM HCL 2 MG/2ML IJ SOLN
INTRAMUSCULAR | Status: DC | PRN
  Administered 2021-08-09 (×2): 2 mg via INTRAVENOUS

## 2021-08-09 MED ORDER — CYCLOPENTOLATE HCL 2 % OP SOLN
1.0000 [drp] | OPHTHALMIC | Status: DC | PRN
  Administered 2021-08-09 (×3): 1 [drp] via OPHTHALMIC

## 2021-08-09 MED ORDER — DEXMEDETOMIDINE (PRECEDEX) IN NS 20 MCG/5ML (4 MCG/ML) IV SYRINGE
PREFILLED_SYRINGE | INTRAVENOUS | Status: DC | PRN
  Administered 2021-08-09 (×2): 10 ug via INTRAVENOUS

## 2021-08-09 MED ORDER — SIGHTPATH DOSE#1 BSS IO SOLN
INTRAOCULAR | Status: DC | PRN
  Administered 2021-08-09: 15 mL

## 2021-08-09 MED ORDER — BRIMONIDINE TARTRATE-TIMOLOL 0.2-0.5 % OP SOLN
OPHTHALMIC | Status: DC | PRN
  Administered 2021-08-09: 1 [drp] via OPHTHALMIC

## 2021-08-09 MED ORDER — SIGHTPATH DOSE#1 BSS IO SOLN
INTRAOCULAR | Status: DC | PRN
  Administered 2021-08-09: 1 mL

## 2021-08-09 MED ORDER — PHENYLEPHRINE HCL 10 % OP SOLN
1.0000 [drp] | OPHTHALMIC | Status: DC | PRN
  Administered 2021-08-09 (×3): 1 [drp] via OPHTHALMIC

## 2021-08-09 MED ORDER — TETRACAINE HCL 0.5 % OP SOLN
1.0000 [drp] | OPHTHALMIC | Status: DC | PRN
  Administered 2021-08-09 (×3): 1 [drp] via OPHTHALMIC

## 2021-08-09 MED ORDER — MOXIFLOXACIN HCL 0.5 % OP SOLN
OPHTHALMIC | Status: DC | PRN
  Administered 2021-08-09: 0.2 mL via OPHTHALMIC

## 2021-08-09 SURGICAL SUPPLY — 16 items
CANNULA ANT/CHMB 27GA (MISCELLANEOUS) ×2 IMPLANT
GLOVE SRG 8 PF TXTR STRL LF DI (GLOVE) ×1 IMPLANT
GLOVE SURG ENC TEXT LTX SZ7.5 (GLOVE) ×2 IMPLANT
GLOVE SURG UNDER POLY LF SZ8 (GLOVE) ×2
GOWN STRL REUS W/ TWL LRG LVL3 (GOWN DISPOSABLE) ×2 IMPLANT
GOWN STRL REUS W/TWL LRG LVL3 (GOWN DISPOSABLE) ×4
LENS IOL TECNIS EYHANCE 21.0 (Intraocular Lens) ×2 IMPLANT
MARKER SKIN DUAL TIP RULER LAB (MISCELLANEOUS) ×2 IMPLANT
NEEDLE CAPSULORHEX 25GA (NEEDLE) ×2 IMPLANT
NEEDLE FILTER BLUNT 18X 1/2SAF (NEEDLE) ×2
NEEDLE FILTER BLUNT 18X1 1/2 (NEEDLE) ×2 IMPLANT
PACK EYE AFTER SURG (MISCELLANEOUS) ×2 IMPLANT
SYR 3ML LL SCALE MARK (SYRINGE) ×4 IMPLANT
SYR TB 1ML LUER SLIP (SYRINGE) ×2 IMPLANT
WATER STERILE IRR 250ML POUR (IV SOLUTION) ×2 IMPLANT
WIPE NON LINTING 3.25X3.25 (MISCELLANEOUS) ×2 IMPLANT

## 2021-08-09 NOTE — Progress Notes (Signed)
Patient is awake and alert, following commands appropriately. VS as charted.

## 2021-08-09 NOTE — Anesthesia Postprocedure Evaluation (Signed)
Anesthesia Post Note  Patient: Corey House.  Procedure(s) Performed: CATARACT EXTRACTION PHACO AND INTRAOCULAR LENS PLACEMENT (IOC) LEFT .051 00:10.0 (Left: Eye)     Patient location during evaluation: PACU Anesthesia Type: MAC Level of consciousness: awake and alert Pain management: pain level controlled Vital Signs Assessment: post-procedure vital signs reviewed and stable Respiratory status: spontaneous breathing, nonlabored ventilation, respiratory function stable and patient connected to nasal cannula oxygen Cardiovascular status: stable and blood pressure returned to baseline Postop Assessment: no apparent nausea or vomiting Anesthetic complications: no   No notable events documented.  Trecia Rogers

## 2021-08-09 NOTE — H&P (Signed)
  [facility]   Primary Care Physician:  Patient, [name] (Inactive) Ophthalmologist: Dr. Lockie Mola  Pre-Procedure History & Physical: HPI:  Corey House. is a 55 y.o. male here for ophthalmic surgery.   Past Medical History:  Diagnosis Date   Chronic cough    Urolithiasis    Urolithiasis     Past Surgical History:  Procedure Laterality Date   HERNIA REPAIR     x2   MANDIBLE FRACTURE SURGERY      Prior to Admission medications   Medication Sig Start Date End Date Taking? Authorizing Provider  buprenorphine-naloxone (SUBOXONE) 8-2 mg SUBL SL tablet Place 1 tablet under the tongue daily.   Yes [provider]    Allergies as of 07/05/2021   (No Known Allergies)    History reviewed. No pertinent family history.  Social History   Socioeconomic History   Marital status: Single    Spouse name: Not on file   Number of children: Not on file   Years of education: Not on file   Highest education level: Not on file  Occupational History   Not on file  Tobacco Use   Smoking status: Every Day    Packs/day: 1.00    Years: 35.00    Pack years: 35.00    Types: Cigarettes   Smokeless tobacco: Never   Tobacco comments:    Started smoking age 77  Vaping Use   Vaping Use: Never used  Substance and Sexual Activity   Alcohol use: No   Drug use: Not on file   Sexual activity: Not on file  Other Topics Concern   Not on file  Social History Narrative   Not on file   Social Determinants of Health   Financial Resource Strain: Not on file  Food Insecurity: Not on file  Transportation Needs: Not on file  Physical Activity: Not on file  Stress: Not on file  Social Connections: Not on file  Intimate Partner Violence: Not on file    Review of Systems: See HPI, otherwise negative ROS  Physical Exam: BP (!) 142/101   Pulse (!) 57   Temp 97.7 F (36.5 C) (Temporal)   Resp 16   Ht 5\' 7"  (1.702 m)   Wt 52.2 kg   SpO2 98%   BMI 18.01  kg/m  General:   Alert,  pleasant and cooperative in NAD Head:  Normocephalic and atraumatic. Lungs:  Clear to auscultation.    Heart:  Regular rate and rhythm.   Impression/Plan: . is here for ophthalmic surgery.  Risks, benefits, limitations, and alternatives regarding ophthalmic surgery have been reviewed with the patient.  Questions have been answered.  All parties agreeable.   [name], MD  08/09/2021, 9:21 AM

## 2021-08-09 NOTE — Op Note (Signed)
  OPERATIVE NOTE  Corey House [id number] 08/09/2021   PREOPERATIVE DIAGNOSIS:  Nuclear sclerotic cataract left eye. H25.12   POSTOPERATIVE DIAGNOSIS:    Nuclear sclerotic cataract left eye.     PROCEDURE:  Phacoemusification with posterior chamber intraocular lens placement of the left eye  Ultrasound time: Procedure(s): CATARACT EXTRACTION PHACO AND INTRAOCULAR LENS PLACEMENT (IOC) LEFT .051 00:10.0 (Left)  LENS:   Implant Name Type Inv. Item Serial No. Manufacturer Lot No. LRB No. Used Action  LENS IOL TECNIS EYHANCE 21.0 - [id number] Intraocular Lens LENS IOL TECNIS EYHANCE 21.0 [id number] JOHNSON   Left 1 Implanted      SURGEON:  Deirdre Evener, MD   ANESTHESIA:  Topical with tetracaine drops and 2% Xylocaine jelly, augmented with 1% preservative-free intracameral lidocaine.    COMPLICATIONS:  None.   DESCRIPTION OF PROCEDURE:  The patient was identified in the holding room and transported to the operating room and placed in the supine position under the operating microscope.  The left eye was identified as the operative eye and it was prepped and draped in the usual sterile ophthalmic fashion.   A 1 millimeter clear-corneal paracentesis was made at the 1:30 position.  0.5 ml of preservative-free 1% lidocaine was injected into the anterior chamber.  The anterior chamber was filled with Viscoat viscoelastic.  A 2.4 millimeter keratome was used to make a near-clear corneal incision at the 10:30 position.  .  A curvilinear capsulorrhexis was made with a cystotome and capsulorrhexis forceps.  Balanced salt solution was used to hydrodissect and hydrodelineate the nucleus.   Phacoemulsification was then used in stop and chop fashion to remove the lens nucleus and epinucleus.  The remaining cortex was then removed using the irrigation and aspiration handpiece. Provisc was then placed into the capsular bag to distend it for lens placement.  A lens was then injected into the  capsular bag.  The remaining viscoelastic was aspirated.   Wounds were hydrated with balanced salt solution.  The anterior chamber was inflated to a physiologic pressure with balanced salt solution.  No wound leaks were noted. Vigamox 0.2 ml of a 1mg  per ml solution was injected into the anterior chamber for a dose of 0.2 mg of intracameral antibiotic at the completion of the case.   Timolol and Brimonidine drops were applied to the eye.  The patient was taken to the recovery room in stable condition without complications of anesthesia or surgery.  [name] 08/09/2021, 10:49 AM

## 2021-08-09 NOTE — Anesthesia Preprocedure Evaluation (Signed)
Anesthesia Evaluation  Patient identified by MRN, date of birth, ID band Patient awake    Reviewed: Allergy & Precautions, H&P , NPO status , Patient's Chart, lab work & pertinent test results, reviewed documented beta blocker date and time   Airway Mallampati: II  TM Distance: >3 FB Neck ROM: full    Dental no notable dental hx.    Pulmonary Current Smoker and Patient abstained from smoking.,    Pulmonary exam normal breath sounds clear to auscultation       Cardiovascular Exercise Tolerance: Good negative cardio ROS Normal cardiovascular exam Rhythm:regular Rate:Normal     Neuro/Psych negative neurological ROS  negative psych ROS   GI/Hepatic negative GI ROS, Neg liver ROS,   Endo/Other  negative endocrine ROS  Renal/GU negative Renal ROS  negative genitourinary   Musculoskeletal   Abdominal   Peds  Hematology negative hematology ROS (+)   Anesthesia Other Findings   Reproductive/Obstetrics negative OB ROS                             Anesthesia Physical Anesthesia Plan  ASA: 2  Anesthesia Plan: MAC   Post-op Pain Management:    Induction:   PONV Risk Score and Plan:   Airway Management Planned:   Additional Equipment:   Intra-op Plan:   Post-operative Plan:   Informed Consent: I have reviewed the patients History and Physical, chart, labs and discussed the procedure including the risks, benefits and alternatives for the proposed anesthesia with the patient or authorized representative who has indicated his/her understanding and acceptance.     Dental Advisory Given  Plan Discussed with: CRNA and Anesthesiologist  Anesthesia Plan Comments:         Anesthesia Quick Evaluation

## 2021-08-09 NOTE — Transfer of Care (Signed)
Immediate Anesthesia Transfer of Care Note  Patient: Corey House.  Procedure(s) Performed: CATARACT EXTRACTION PHACO AND INTRAOCULAR LENS PLACEMENT (IOC) LEFT .051 00:10.0 (Left: Eye)  Patient Location: PACU  Anesthesia Type: MAC  Level of Consciousness: awake, alert  and patient cooperative  Airway and Oxygen Therapy: Patient Spontanous Breathing and Patient connected to supplemental oxygen  Post-op Assessment: Post-op Vital signs reviewed, Patient's Cardiovascular Status Stable, Respiratory Function Stable, Patent Airway and No signs of Nausea or vomiting  Post-op Vital Signs: Reviewed and stable  Complications: No notable events documented.

## 2021-08-11 ENCOUNTER — Encounter: Payer: Self-pay | Admitting: Ophthalmology

## 2021-09-20 ENCOUNTER — Encounter: Payer: Self-pay | Admitting: Ophthalmology

## 2021-10-02 NOTE — Discharge Instructions (Signed)

## 2021-10-04 ENCOUNTER — Ambulatory Visit: Payer: Self-pay | Admitting: Anesthesiology

## 2021-10-04 ENCOUNTER — Ambulatory Visit
Admission: RE | Admit: 2021-10-04 | Discharge: 2021-10-04 | Disposition: A | Discharge status: Home / Self Care | Payer: Self-pay | Source: Ambulatory Visit | Attending: Ophthalmology | Admitting: Ophthalmology

## 2021-10-04 ENCOUNTER — Encounter: Payer: Self-pay | Admitting: Ophthalmology

## 2021-10-04 ENCOUNTER — Other Ambulatory Visit: Payer: Self-pay

## 2021-10-04 ENCOUNTER — Encounter
Admission: RE | Disposition: A | Discharge status: Home / Self Care | Payer: Self-pay | Source: Ambulatory Visit | Attending: Ophthalmology

## 2021-10-04 DIAGNOSIS — F17210 Nicotine dependence, cigarettes, uncomplicated: Secondary | ICD-10-CM | POA: Insufficient documentation

## 2021-10-04 DIAGNOSIS — H2511 Age-related nuclear cataract, right eye: Secondary | ICD-10-CM | POA: Insufficient documentation

## 2021-10-04 HISTORY — PX: CATARACT EXTRACTION W/PHACO: SHX586

## 2021-10-04 SURGERY — PHACOEMULSIFICATION, CATARACT, WITH IOL INSERTION
Anesthesia: General | Site: Eye | Laterality: Right

## 2021-10-04 MED ORDER — SIGHTPATH DOSE#1 BSS IO SOLN
INTRAOCULAR | Status: DC | PRN
  Administered 2021-10-04: 51 mL via OPHTHALMIC

## 2021-10-04 MED ORDER — TETRACAINE HCL 0.5 % OP SOLN
1.0000 [drp] | OPHTHALMIC | Status: DC | PRN
  Administered 2021-10-04 (×3): 1 [drp] via OPHTHALMIC

## 2021-10-04 MED ORDER — BRIMONIDINE TARTRATE-TIMOLOL 0.2-0.5 % OP SOLN
OPHTHALMIC | Status: DC | PRN
  Administered 2021-10-04: 1 [drp] via OPHTHALMIC

## 2021-10-04 MED ORDER — MIDAZOLAM HCL 5 MG/5ML IJ SOLN
INTRAMUSCULAR | Status: DC | PRN
  Administered 2021-10-04: 2 mg via INTRAVENOUS

## 2021-10-04 MED ORDER — LIDOCAINE HCL (CARDIAC) PF 100 MG/5ML IV SOSY
PREFILLED_SYRINGE | INTRAVENOUS | Status: DC | PRN
  Administered 2021-10-04: 50 mg via INTRATRACHEAL

## 2021-10-04 MED ORDER — FENTANYL CITRATE (PF) 100 MCG/2ML IJ SOLN
INTRAMUSCULAR | Status: DC | PRN
  Administered 2021-10-04: 100 ug via INTRAVENOUS

## 2021-10-04 MED ORDER — ARMC OPHTHALMIC DILATING DROPS
1.0000 "application " | OPHTHALMIC | Status: DC | PRN
  Administered 2021-10-04 (×3): 1 via OPHTHALMIC

## 2021-10-04 MED ORDER — SIGHTPATH DOSE#1 BSS IO SOLN
INTRAOCULAR | Status: DC | PRN
  Administered 2021-10-04: 2 mL

## 2021-10-04 MED ORDER — SIGHTPATH DOSE#1 NA HYALUR & NA CHOND-NA HYALUR IO KIT
PACK | INTRAOCULAR | Status: DC | PRN
  Administered 2021-10-04: 1 via OPHTHALMIC

## 2021-10-04 MED ORDER — SIGHTPATH DOSE#1 BSS IO SOLN
INTRAOCULAR | Status: DC | PRN
  Administered 2021-10-04: 15 mL via INTRAOCULAR

## 2021-10-04 MED ORDER — MOXIFLOXACIN HCL 0.5 % OP SOLN
OPHTHALMIC | Status: DC | PRN
  Administered 2021-10-04: 0.2 mL via OPHTHALMIC

## 2021-10-04 MED ORDER — PROPOFOL 10 MG/ML IV BOLUS
INTRAVENOUS | Status: DC | PRN
  Administered 2021-10-04: 150 mg via INTRAVENOUS

## 2021-10-04 MED ORDER — DEXMEDETOMIDINE (PRECEDEX) IN NS 20 MCG/5ML (4 MCG/ML) IV SYRINGE
PREFILLED_SYRINGE | INTRAVENOUS | Status: DC | PRN
  Administered 2021-10-04: 10 ug via INTRAVENOUS

## 2021-10-04 MED ORDER — LACTATED RINGERS IV SOLN: INTRAVENOUS | Status: DC

## 2021-10-04 SURGICAL SUPPLY — 16 items
CANNULA ANT/CHMB 27GA (MISCELLANEOUS) IMPLANT
GLOVE SRG 8 PF TXTR STRL LF DI (GLOVE) ×1 IMPLANT
GLOVE SURG ENC TEXT LTX SZ7.5 (GLOVE) ×2 IMPLANT
GLOVE SURG UNDER POLY LF SZ8 (GLOVE) ×2
GOWN STRL REUS W/ TWL LRG LVL3 (GOWN DISPOSABLE) ×2 IMPLANT
GOWN STRL REUS W/TWL LRG LVL3 (GOWN DISPOSABLE) ×4
LENS IOL TECNIS EYHANCE 21.0 (Intraocular Lens) ×2 IMPLANT
MARKER SKIN DUAL TIP RULER LAB (MISCELLANEOUS) ×2 IMPLANT
NEEDLE CAPSULORHEX 25GA (NEEDLE) IMPLANT
NEEDLE FILTER BLUNT 18X 1/2SAF (NEEDLE) ×2
NEEDLE FILTER BLUNT 18X1 1/2 (NEEDLE) ×2 IMPLANT
PACK EYE AFTER SURG (MISCELLANEOUS) IMPLANT
SYR 3ML LL SCALE MARK (SYRINGE) ×4 IMPLANT
SYR TB 1ML LUER SLIP (SYRINGE) ×2 IMPLANT
WATER STERILE IRR 250ML POUR (IV SOLUTION) ×2 IMPLANT
WIPE NON LINTING 3.25X3.25 (MISCELLANEOUS) ×2 IMPLANT

## 2021-10-04 NOTE — Anesthesia Procedure Notes (Signed)
Procedure Name: LMA Insertion Date/Time: 10/04/2021 1:36 PM Performed by: Jinny Blossom, CRNA Pre-anesthesia Checklist: Patient identified, Emergency Drugs available, Suction available, Patient being monitored and Timeout performed Patient Re-evaluated:Patient Re-evaluated prior to induction Oxygen Delivery Method: Circle system utilized Preoxygenation: Pre-oxygenation with 100% oxygen Induction Type: IV induction Ventilation: Mask ventilation without difficulty LMA: LMA inserted LMA Size: 4.0 Tube secured with: Tape

## 2021-10-04 NOTE — Op Note (Signed)
LOCATION:  [facility]   PREOPERATIVE DIAGNOSIS:    Nuclear sclerotic cataract right eye. H25.11   POSTOPERATIVE DIAGNOSIS:  Nuclear sclerotic cataract right eye.     PROCEDURE:  Phacoemusification with posterior chamber intraocular lens placement of the right eye   ULTRASOUND TIME: Procedure(s) with comments: CATARACT EXTRACTION PHACO AND INTRAOCULAR LENS PLACEMENT (IOC) RIGHT 0.67 00:13.8 (Right) - general anesthesia needs to be last case  LENS:   Implant Name Type Inv. Item Serial No. Manufacturer Lot No. LRB No. Used Action  LENS IOL TECNIS EYHANCE 21.0 - [id number] Intraocular Lens LENS IOL TECNIS EYHANCE 21.0 [id number] JOHNSON   Right 1 Implanted         SURGEON:  Deirdre Evener, MD   ANESTHESIA:  General    COMPLICATIONS:  None.   DESCRIPTION OF PROCEDURE:  The patient was identified in the holding room and transported to the operating room and placed in the supine position under the operating microscope. Anesthesia was induced. The right eye was identified as the operative eye and it was prepped and draped in the usual sterile ophthalmic fashion.   A 1 millimeter clear-corneal paracentesis was made at the 12:00 position.  0.5 ml of preservative-free 1% lidocaine was injected into the anterior chamber. The anterior chamber was filled with Viscoat viscoelastic.  A 2.4 millimeter keratome was used to make a near-clear corneal incision at the 9:00 position.  A curvilinear capsulorrhexis was made with a cystotome and capsulorrhexis forceps.  Balanced salt solution was used to hydrodissect and hydrodelineate the nucleus.   Phacoemulsification was then used in stop and chop fashion to remove the lens nucleus and epinucleus.  The remaining cortex was then removed using the irrigation and aspiration handpiece. Provisc was then placed into the capsular bag to distend it for lens placement.  A lens was then injected into the capsular bag.  The remaining viscoelastic was  aspirated.   Wounds were hydrated with balanced salt solution.  The anterior chamber was inflated to a physiologic pressure with balanced salt solution.  No wound leaks were noted. Cefuroxime 0.1 ml of a 10mg /ml solution was injected into the anterior chamber for a dose of 1 mg of intracameral antibiotic at the completion of the case.   Timolol and Brimonidine drops were applied to the eye.  The patient was awakened and taken to the recovery room in stable condition without complications of anesthesia or surgery.   Imanuel Pruiett 10/04/2021, 1:50 PM

## 2021-10-04 NOTE — H&P (Signed)
  [facility]   Primary Care Physician:  Patient, [name] (Inactive) Ophthalmologist: Dr. Lockie Mola  Pre-Procedure History & Physical: HPI:  Corey House. is a 55 y.o. male here for ophthalmic surgery.   Past Medical History:  Diagnosis Date   Chronic cough    Urolithiasis    Urolithiasis     Past Surgical History:  Procedure Laterality Date   CATARACT EXTRACTION W/PHACO Left 08/09/2021   Procedure: CATARACT EXTRACTION PHACO AND INTRAOCULAR LENS PLACEMENT (IOC) LEFT .051 00:10.0;  Surgeon: Lockie Mola, MD;  Location: [facility];  Service: Ophthalmology;  Laterality: Left;   HERNIA REPAIR     x2   MANDIBLE FRACTURE SURGERY      Prior to Admission medications   Medication Sig Start Date End Date Taking? Authorizing Provider  buprenorphine-naloxone (SUBOXONE) 8-2 mg SUBL SL tablet Place 1 tablet under the tongue daily.   Yes [provider]    Allergies as of 08/31/2021   (No Known Allergies)    History reviewed. No pertinent family history.  Social History   Socioeconomic History   Marital status: Single    Spouse name: Not on file   Number of children: Not on file   Years of education: Not on file   Highest education level: Not on file  Occupational History   Not on file  Tobacco Use   Smoking status: Every Day    Packs/day: 1.00    Years: 35.00    Pack years: 35.00    Types: Cigarettes   Smokeless tobacco: Never   Tobacco comments:    Started smoking age 4  Vaping Use   Vaping Use: Never used  Substance and Sexual Activity   Alcohol use: No   Drug use: Not on file   Sexual activity: Not on file  Other Topics Concern   Not on file  Social History Narrative   Not on file   Social Determinants of Health   Financial Resource Strain: Not on file  Food Insecurity: Not on file  Transportation Needs: Not on file  Physical Activity: Not on file  Stress: Not on file  Social Connections: Not on file   Intimate Partner Violence: Not on file    Review of Systems: See HPI, otherwise negative ROS  Physical Exam: BP 120/81   Pulse 71   Temp 97.7 F (36.5 C) (Temporal)   Resp 20   Ht 5\' 7"  (1.702 m)   Wt 51.7 kg   SpO2 97%   BMI 17.85 kg/m  General:   Alert,  pleasant and cooperative in NAD Head:  Normocephalic and atraumatic. Lungs:  Clear to auscultation.    Heart:  Regular rate and rhythm.   Impression/Plan: . is here for ophthalmic surgery.  Risks, benefits, limitations, and alternatives regarding ophthalmic surgery have been reviewed with the patient.  Questions have been answered.  All parties agreeable.   [name], MD  10/04/2021, 12:29 PM

## 2021-10-04 NOTE — Anesthesia Postprocedure Evaluation (Signed)
Anesthesia Post Note  Patient: Corey House.  Procedure(s) Performed: CATARACT EXTRACTION PHACO AND INTRAOCULAR LENS PLACEMENT (IOC) RIGHT 0.67 00:13.8 (Right: Eye)     Patient location during evaluation: PACU Anesthesia Type: General Level of consciousness: awake and alert Pain management: pain level controlled Vital Signs Assessment: post-procedure vital signs reviewed and stable Respiratory status: spontaneous breathing and nonlabored ventilation Cardiovascular status: blood pressure returned to baseline Postop Assessment: no apparent nausea or vomiting Anesthetic complications: no   No notable events documented.  Markas Aldredge [employer]

## 2021-10-04 NOTE — Anesthesia Preprocedure Evaluation (Addendum)
Anesthesia Evaluation  Patient identified by MRN, date of birth, ID band Patient awake    Reviewed: Allergy & Precautions, H&P , NPO status , Patient's Chart, lab work & pertinent test results  Airway Mallampati: II  TM Distance: >3 FB Neck ROM: full    Dental no notable dental hx.    Pulmonary Current Smoker and Patient abstained from smoking.,  35 pack years   Pulmonary exam normal        Cardiovascular Exercise Tolerance: Good negative cardio ROS Normal cardiovascular exam     Neuro/Psych Previous opioid addiction. Currently takes suboxone, last dose 10/17/2022negative neurological ROS     GI/Hepatic negative GI ROS, Neg liver ROS,   Endo/Other  negative endocrine ROS  Renal/GU negative Renal ROS  negative genitourinary   Musculoskeletal   Abdominal Normal abdominal exam  (+)   Peds  Hematology negative hematology ROS (+)   Anesthesia Other Findings   Reproductive/Obstetrics negative OB ROS                           Anesthesia Physical  Anesthesia Plan  ASA: 2  Anesthesia Plan: General   Post-op Pain Management:    Induction: Intravenous  PONV Risk Score and Plan: 1 and Midazolam, Treatment may vary due to age or medical condition and Ondansetron  Airway Management Planned: LMA  Additional Equipment:   Intra-op Plan:   Post-operative Plan: Extubation in OR  Informed Consent: I have reviewed the patients History and Physical, chart, labs and discussed the procedure including the risks, benefits and alternatives for the proposed anesthesia with the patient or authorized representative who has indicated his/her understanding and acceptance.     Dental Advisory Given  Plan Discussed with: CRNA  Anesthesia Plan Comments:         Anesthesia Quick Evaluation

## 2021-10-04 NOTE — Transfer of Care (Signed)
Immediate Anesthesia Transfer of Care Note  Patient: Corey House.  Procedure(s) Performed: CATARACT EXTRACTION PHACO AND INTRAOCULAR LENS PLACEMENT (IOC) RIGHT 0.67 00:13.8 (Right: Eye)  Patient Location: PACU  Anesthesia Type: General  Level of Consciousness: awake, alert  and patient cooperative  Airway and Oxygen Therapy: Patient Spontanous Breathing and Patient connected to supplemental oxygen  Post-op Assessment: Post-op Vital signs reviewed, Patient's Cardiovascular Status Stable, Respiratory Function Stable, Patent Airway and No signs of Nausea or vomiting  Post-op Vital Signs: Reviewed and stable  Complications: No notable events documented.

## 2021-10-05 ENCOUNTER — Encounter: Payer: Self-pay | Admitting: Ophthalmology

## 2023-01-03 ENCOUNTER — Emergency Department: Payer: Self-pay

## 2023-01-03 ENCOUNTER — Inpatient Hospital Stay
Admission: EM | Admit: 2023-01-03 | Discharge: 2023-01-07 | DRG: 190 | Disposition: A | Discharge status: Home / Self Care | Payer: Self-pay | Attending: Hospitalist | Admitting: Hospitalist

## 2023-01-03 ENCOUNTER — Encounter: Payer: Self-pay | Admitting: *Deleted

## 2023-01-03 ENCOUNTER — Other Ambulatory Visit: Payer: Self-pay

## 2023-01-03 DIAGNOSIS — Z720 Tobacco use: Secondary | ICD-10-CM | POA: Diagnosis present

## 2023-01-03 DIAGNOSIS — R053 Chronic cough: Secondary | ICD-10-CM | POA: Diagnosis present

## 2023-01-03 DIAGNOSIS — E43 Unspecified severe protein-calorie malnutrition: Secondary | ICD-10-CM | POA: Insufficient documentation

## 2023-01-03 DIAGNOSIS — F419 Anxiety disorder, unspecified: Secondary | ICD-10-CM | POA: Diagnosis present

## 2023-01-03 DIAGNOSIS — F331 Major depressive disorder, recurrent, moderate: Secondary | ICD-10-CM | POA: Diagnosis present

## 2023-01-03 DIAGNOSIS — F17200 Nicotine dependence, unspecified, uncomplicated: Secondary | ICD-10-CM | POA: Diagnosis present

## 2023-01-03 DIAGNOSIS — L89151 Pressure ulcer of sacral region, stage 1: Secondary | ICD-10-CM | POA: Diagnosis present

## 2023-01-03 DIAGNOSIS — F410 Panic disorder [episodic paroxysmal anxiety] without agoraphobia: Secondary | ICD-10-CM | POA: Diagnosis present

## 2023-01-03 DIAGNOSIS — Z9841 Cataract extraction status, right eye: Secondary | ICD-10-CM

## 2023-01-03 DIAGNOSIS — Z9842 Cataract extraction status, left eye: Secondary | ICD-10-CM

## 2023-01-03 DIAGNOSIS — Z1152 Encounter for screening for COVID-19: Secondary | ICD-10-CM

## 2023-01-03 DIAGNOSIS — J440 Chronic obstructive pulmonary disease with acute lower respiratory infection: Principal | ICD-10-CM | POA: Diagnosis present

## 2023-01-03 DIAGNOSIS — Z961 Presence of intraocular lens: Secondary | ICD-10-CM | POA: Diagnosis present

## 2023-01-03 DIAGNOSIS — R059 Cough, unspecified: Secondary | ICD-10-CM | POA: Diagnosis present

## 2023-01-03 DIAGNOSIS — J441 Chronic obstructive pulmonary disease with (acute) exacerbation: Secondary | ICD-10-CM | POA: Diagnosis present

## 2023-01-03 DIAGNOSIS — J9601 Acute respiratory failure with hypoxia: Principal | ICD-10-CM | POA: Diagnosis present

## 2023-01-03 DIAGNOSIS — R051 Acute cough: Secondary | ICD-10-CM

## 2023-01-03 DIAGNOSIS — E875 Hyperkalemia: Secondary | ICD-10-CM | POA: Diagnosis not present

## 2023-01-03 DIAGNOSIS — F17210 Nicotine dependence, cigarettes, uncomplicated: Secondary | ICD-10-CM | POA: Diagnosis present

## 2023-01-03 DIAGNOSIS — A419 Sepsis, unspecified organism: Secondary | ICD-10-CM

## 2023-01-03 DIAGNOSIS — F1110 Opioid abuse, uncomplicated: Secondary | ICD-10-CM | POA: Diagnosis present

## 2023-01-03 DIAGNOSIS — Z681 Body mass index (BMI) 19 or less, adult: Secondary | ICD-10-CM

## 2023-01-03 DIAGNOSIS — L8990 Pressure ulcer of unspecified site, unspecified stage: Secondary | ICD-10-CM | POA: Insufficient documentation

## 2023-01-03 LAB — BASIC METABOLIC PANEL
Anion gap: 10 (ref 5–15)
BUN: 18 mg/dL (ref 6–20)
CO2: 32 mmol/L (ref 22–32)
Calcium: 9.2 mg/dL (ref 8.9–10.3)
Chloride: 97 mmol/L — ABNORMAL LOW (ref 98–111)
Creatinine, Ser: 0.82 mg/dL (ref 0.61–1.24)
GFR, Estimated: >60 mL/min (ref >60)
Glucose, Bld: 110 mg/dL — ABNORMAL HIGH (ref 70–99)
Potassium: 5.3 mmol/L — ABNORMAL HIGH (ref 3.5–5.1)
Sodium: 139 mmol/L (ref 135–145)

## 2023-01-03 LAB — URINALYSIS, ROUTINE W REFLEX MICROSCOPIC
Bilirubin Urine: NEGATIVE
Glucose, UA: NEGATIVE mg/dL
Hgb urine dipstick: NEGATIVE
Ketones, ur: 20 mg/dL — AB
Leukocytes,Ua: NEGATIVE
Nitrite: NEGATIVE
Protein, ur: 30 mg/dL — AB
Specific Gravity, Urine: 1.025 (ref 1.005–1.030)
pH: 5 (ref 5.0–8.0)

## 2023-01-03 LAB — URINE DRUG SCREEN, QUALITATIVE (ARMC ONLY)
Amphetamines, Ur Screen: NOT DETECTED
Barbiturates, Ur Screen: NOT DETECTED
Benzodiazepine, Ur Scrn: NOT DETECTED
Cannabinoid 50 Ng, Ur ~~LOC~~: NOT DETECTED
Cocaine Metabolite,Ur ~~LOC~~: NOT DETECTED
MDMA (Ecstasy)Ur Screen: NOT DETECTED
Methadone Scn, Ur: NOT DETECTED
Opiate, Ur Screen: NOT DETECTED
Phencyclidine (PCP) Ur S: NOT DETECTED
Tricyclic, Ur Screen: NOT DETECTED

## 2023-01-03 LAB — CBC
HCT: 52.4 % — ABNORMAL HIGH (ref 39.0–52.0)
Hemoglobin: 16.4 g/dL (ref 13.0–17.0)
MCH: 29.4 pg (ref 26.0–34.0)
MCHC: 31.3 g/dL (ref 30.0–36.0)
MCV: 93.9 fL (ref 80.0–100.0)
Platelets: 381 10*3/uL (ref 150–400)
RBC: 5.58 MIL/uL (ref 4.22–5.81)
RDW: 14.2 % (ref 11.5–15.5)
WBC: 12.6 10*3/uL — ABNORMAL HIGH (ref 4.0–10.5)
nRBC: 0 % (ref 0.0–0.2)

## 2023-01-03 LAB — RESP PANEL BY RT-PCR (RSV, FLU A&B, COVID)  RVPGX2
Influenza A by PCR: NEGATIVE
Influenza B by PCR: NEGATIVE
Resp Syncytial Virus by PCR: NEGATIVE
SARS Coronavirus 2 by RT PCR: NEGATIVE

## 2023-01-03 LAB — TROPONIN I (HIGH SENSITIVITY): Troponin I (High Sensitivity): 6 ng/L (ref <18)

## 2023-01-03 LAB — BRAIN NATRIURETIC PEPTIDE: B Natriuretic Peptide: 48.9 pg/mL (ref 0.0–100.0)

## 2023-01-03 LAB — ACETAMINOPHEN LEVEL: Acetaminophen (Tylenol), Serum: <10 ug/mL — ABNORMAL LOW (ref 10–30)

## 2023-01-03 LAB — SALICYLATE LEVEL: Salicylate Lvl: 36.1 mg/dL — ABNORMAL HIGH (ref 7.0–30.0)

## 2023-01-03 LAB — D-DIMER, QUANTITATIVE: D-Dimer, Quant: 0.55 ug/mL-FEU — ABNORMAL HIGH (ref 0.00–0.50)

## 2023-01-03 MED ORDER — ONDANSETRON HCL 4 MG/2ML IJ SOLN: 4.0000 mg | Freq: Four times a day (QID) | INTRAMUSCULAR | Status: DC | PRN

## 2023-01-03 MED ORDER — SODIUM CHLORIDE 0.9% FLUSH
3.0000 mL | Freq: Two times a day (BID) | INTRAVENOUS | Status: DC
  Administered 2023-01-04 – 2023-01-07 (×7): 3 mL via INTRAVENOUS

## 2023-01-03 MED ORDER — ACETAMINOPHEN 325 MG PO TABS
650.0000 mg | ORAL_TABLET | Freq: Four times a day (QID) | ORAL | Status: DC | PRN
  Administered 2023-01-04 – 2023-01-05 (×3): 650 mg via ORAL
  Filled 2023-01-03 (×3): qty 2

## 2023-01-03 MED ORDER — SODIUM CHLORIDE 0.9 % IV SOLN
500.0000 mg | INTRAVENOUS | Status: DC
  Administered 2023-01-04: 500 mg via INTRAVENOUS
  Filled 2023-01-03: qty 500

## 2023-01-03 MED ORDER — GUAIFENESIN 100 MG/5ML PO LIQD: 5.0000 mL | ORAL | Status: DC | PRN

## 2023-01-03 MED ORDER — HYDRALAZINE HCL 20 MG/ML IJ SOLN: 5.0000 mg | INTRAMUSCULAR | Status: DC | PRN

## 2023-01-03 MED ORDER — PREDNISONE 20 MG PO TABS
40.0000 mg | ORAL_TABLET | Freq: Every day | ORAL | Status: DC
  Administered 2023-01-05 – 2023-01-07 (×3): 40 mg via ORAL
  Filled 2023-01-03 (×3): qty 2

## 2023-01-03 MED ORDER — SODIUM CHLORIDE 0.9 % IV SOLN
2.0000 g | INTRAVENOUS | Status: DC
  Administered 2023-01-04: 2 g via INTRAVENOUS
  Filled 2023-01-03: qty 20

## 2023-01-03 MED ORDER — METHYLPREDNISOLONE SODIUM SUCC 125 MG IJ SOLR
60.0000 mg | Freq: Two times a day (BID) | INTRAMUSCULAR | Status: AC
  Administered 2023-01-04 (×2): 60 mg via INTRAVENOUS
  Filled 2023-01-03 (×2): qty 2

## 2023-01-03 MED ORDER — NICOTINE 14 MG/24HR TD PT24
14.0000 mg | MEDICATED_PATCH | Freq: Every day | TRANSDERMAL | Status: DC
  Administered 2023-01-04 – 2023-01-07 (×5): 14 mg via TRANSDERMAL
  Filled 2023-01-03 (×5): qty 1

## 2023-01-03 MED ORDER — ACETAMINOPHEN 650 MG RE SUPP: 650.0000 mg | Freq: Four times a day (QID) | RECTAL | Status: DC | PRN

## 2023-01-03 MED ORDER — BISACODYL 5 MG PO TBEC: 5.0000 mg | DELAYED_RELEASE_TABLET | Freq: Every day | ORAL | Status: DC | PRN

## 2023-01-03 MED ORDER — IPRATROPIUM-ALBUTEROL 0.5-2.5 (3) MG/3ML IN SOLN
3.0000 mL | Freq: Once | RESPIRATORY_TRACT | Status: AC
  Administered 2023-01-04: 3 mL via RESPIRATORY_TRACT
  Filled 2023-01-03: qty 9

## 2023-01-03 MED ORDER — ONDANSETRON HCL 4 MG PO TABS: 4.0000 mg | ORAL_TABLET | Freq: Four times a day (QID) | ORAL | Status: DC | PRN

## 2023-01-03 MED ORDER — HYDROCODONE-ACETAMINOPHEN 5-325 MG PO TABS: 1.0000 | ORAL_TABLET | ORAL | Status: DC | PRN

## 2023-01-03 MED ORDER — IPRATROPIUM-ALBUTEROL 0.5-2.5 (3) MG/3ML IN SOLN
3.0000 mL | Freq: Once | RESPIRATORY_TRACT | Status: AC
  Administered 2023-01-04: 3 mL via RESPIRATORY_TRACT

## 2023-01-03 MED ORDER — LACTATED RINGERS IV SOLN: INTRAVENOUS | Status: AC

## 2023-01-03 MED ORDER — METHYLPREDNISOLONE SODIUM SUCC 125 MG IJ SOLR
125.0000 mg | Freq: Once | INTRAMUSCULAR | Status: AC
  Administered 2023-01-04: 125 mg via INTRAVENOUS
  Filled 2023-01-03: qty 2

## 2023-01-03 MED ORDER — DOCUSATE SODIUM 100 MG PO CAPS
100.0000 mg | ORAL_CAPSULE | Freq: Two times a day (BID) | ORAL | Status: DC
  Administered 2023-01-04 – 2023-01-07 (×7): 100 mg via ORAL
  Filled 2023-01-03 (×8): qty 1

## 2023-01-03 MED ORDER — ENOXAPARIN SODIUM 40 MG/0.4ML IJ SOSY
40.0000 mg | PREFILLED_SYRINGE | INTRAMUSCULAR | Status: DC
  Administered 2023-01-04 – 2023-01-07 (×4): 40 mg via SUBCUTANEOUS
  Filled 2023-01-03 (×5): qty 0.4

## 2023-01-03 MED ORDER — POLYETHYLENE GLYCOL 3350 17 G PO PACK: 17.0000 g | PACK | Freq: Every day | ORAL | Status: DC | PRN

## 2023-01-03 NOTE — ED Provider Notes (Signed)
[facility] Provider Note    Event Date/Time   First MD Initiated Contact with Patient 01/03/23 2225     (approximate)   History   Psychiatric Evaluation and Cough   HPI  Corey Vise. is a 57 y.o. male who comes in voluntary due to concerns for SI per triage note.  He also reports having a cough and was seen in urgent care today and given a breathing treatment.  Patient was noted to have low oxygen levels in triage.  He does report symptoms have been going going for 4 days.  He does report taking Suboxone as needed.  Patient was placed on 2 L of oxygen.  Patient states that he does smoke.  He reports having a clear cough for weeks but then this past week it became more greenish in color.  He denies any history of blood clots.  Swelling in his legs.  Patient states that he has had some depression for some time now.  He denies any SI to me denies any plan.  He denies feeling like he is a danger to himself.  Physical Exam   Triage Vital Signs: ED Triage Vitals  Enc Vitals Group     BP 01/03/23 2100 101/80     Pulse Rate 01/03/23 2100 98     Resp 01/03/23 2100 (!) 22     Temp 01/03/23 2100 98.5 F (36.9 C)     Temp Source 01/03/23 2100 Oral     SpO2 01/03/23 2100 (!) 85 %     Weight 01/03/23 2101 120 lb (54.4 kg)     Height 01/03/23 2101 5\' 7"  (1.702 m)     Head Circumference --      Peak Flow --      Pain Score 01/03/23 2101 10     Pain Loc --      Pain Edu? --      Excl. in [place]? --     Most recent vital signs: Vitals:   01/03/23 2100  BP: 101/80  Pulse: 98  Resp: (!) 22  Temp: 98.5 F (36.9 C)  SpO2: (!) 85%     General: Awake, no distress.  CV:  Good peripheral perfusion.  Resp:  Normal effort.  Abd:  No distention.  Soft and nontender Other:  No swelling in legs.  No calf tenderness.  Denies any SI.   ED Results / Procedures / Treatments   Labs (all labs ordered are listed, but only abnormal results are displayed) Labs  Reviewed  BASIC METABOLIC PANEL - Abnormal; Notable for the following components:      Result Value   Potassium 5.3 (*)    Chloride 97 (*)    Glucose, Bld 110 (*)    All other components within normal limits  CBC - Abnormal; Notable for the following components:   WBC 12.6 (*)    HCT 52.4 (*)    All other components within normal limits  URINALYSIS, ROUTINE W REFLEX MICROSCOPIC - Abnormal; Notable for the following components:   Color, Urine YELLOW (*)    APPearance CLEAR (*)    Ketones, ur 20 (*)    Protein, ur 30 (*)    Bacteria, UA RARE (*)    All other components within normal limits  RESP PANEL BY RT-PCR (RSV, FLU A&B, COVID)  [id number]  URINE DRUG SCREEN, QUALITATIVE ([facility] ONLY)  TROPONIN I (HIGH SENSITIVITY)     EKG  My interpretation of EKG:  Sinus  tachycardia rate of 105 without any ST elevation, T wave version in V2 V3, normal intervals  RADIOLOGY I have reviewed the xray personally and interpreted and he is got some bilateral bronchovascular prominence concerning for viral pneumonitis or bronchiolitis   PROCEDURES:  Critical Care performed: Yes, see critical care procedure note(s)  .1-3 Lead EKG Interpretation  Performed by: Concha Se, MD Authorized by: Concha Se, MD     Interpretation: normal     ECG rate:  90   ECG rate assessment: normal     Rhythm: sinus rhythm     Ectopy: none     Conduction: normal   .Critical Care  Performed by: Concha Se, MD Authorized by: Concha Se, MD   Critical care provider statement:    Critical care time (minutes):  30   Critical care was necessary to treat or prevent imminent or life-threatening deterioration of the following conditions:  Respiratory failure   Critical care was time spent personally by me on the following activities:  Development of treatment plan with patient or surrogate, discussions with consultants, evaluation of patient's response to treatment, examination of patient, ordering and  review of laboratory studies, ordering and review of radiographic studies, ordering and performing treatments and interventions, pulse oximetry, re-evaluation of patient's condition and review of old charts    MEDICATIONS ORDERED IN ED: Medications  ipratropium-albuterol (DUONEB) 0.5-2.5 (3) MG/3ML nebulizer solution 3 mL (has no administration in time range)  methylPREDNISolone sodium succinate (SOLU-MEDROL) 125 mg/2 mL injection 125 mg (has no administration in time range)  ipratropium-albuterol (DUONEB) 0.5-2.5 (3) MG/3ML nebulizer solution 3 mL (has no administration in time range)  ipratropium-albuterol (DUONEB) 0.5-2.5 (3) MG/3ML nebulizer solution 3 mL (has no administration in time range)  cefTRIAXone (ROCEPHIN) 2 g in sodium chloride 0.9 % 100 mL IVPB (has no administration in time range)  azithromycin (ZITHROMAX) 500 mg in sodium chloride 0.9 % 250 mL IVPB (has no administration in time range)     IMPRESSION / MDM / ASSESSMENT AND PLAN / ED COURSE  I reviewed the triage vital signs and the nursing notes.   Patient's presentation is most consistent with acute presentation with potential threat to life or bodily function.   She comes in with hypoxia, cough.  Chest x-ray concerning for bronchitis.  No obvious pneumonia.  Patient is afebrile.  Labs ordered evaluate for COVID, flu  Urine drug negative.  BMP shows slight elevation of potassium.  CBC shows elevated white count.  Troponin is negative.  Will give a little bit of fluids given ketones are elevated.  COVID, flu are negative.  Patient meet sepsis criteria with white count and elevated respiratory rate will start on antibiotics to cover for pneumonia.  Will discuss with hospital team for admission.  He does report some depression but denies SI or plan to me.  I placed a consult for psychiatry in but do not feel that he meets IVC criteria but patient is willing to stay voluntary from a respiratory standpoint.  He denies  feeling like he is a danger to himself while here and he states that he would let us know if things were changing.  The patient is on the cardiac monitor to evaluate for evidence of arrhythmia and/or significant heart rate changes.      FINAL CLINICAL IMPRESSION(S) / ED DIAGNOSES   Final diagnoses:  Acute respiratory failure with hypoxia (HCC)  Sepsis, due to unspecified organism, unspecified whether acute organ dysfunction present (HCC)  Rx / DC Orders   ED Discharge Orders     None        Note:  This document was prepared using Dragon voice recognition software and may include unintentional dictation errors.   Vanessa Dupree, MD 01/03/23 2259

## 2023-01-03 NOTE — ED Triage Notes (Signed)
Pt brought in by [employer].  Pt is voluntary.  Pt reports SI.  Pt also has a cough and was at urgent care today and given to breathing treatments.  Pt has a cough  low oxygen sats in triage.    Cig smoker.  No fever.  Sx for 4 days.  Pt alert speech clear    pt denies drug use.  No etoh use.  Pt takes suboxone prn.    Pt placed on 2 liters oxygen [place] in triage

## 2023-01-03 NOTE — Sepsis Progress Note (Signed)
Elink monitoring for the code sepsis protocol.  

## 2023-01-03 NOTE — H&P (Signed)
History and Physical    Chief Complaint: Cough    HISTORY OF PRESENT ILLNESS: Corey House. is an 57 y.o. male brought to the [facility] [employer] for concern of suicidal ideation and cough. Patient in the emergency room denies any suicidal ideation or homicidal ideation.  Does report sadness and feeling depressed. ED MD has requested psych consult.  Patient is not committed with IVC. Patient was seen in an urgent care today and given breathing treatments. Patient still reports feeling short of breath and his symptoms have been going on for about 3 to 4 days. Patient was started on 2 L of oxygen as he was found to be hypoxic.  Patient does have a smoking history and does continue to smoke every day. Chart review shows imaging of the chest showing patient has a history of COPD.  Pt has  Past Medical History:  Diagnosis Date   Chronic cough    Urolithiasis    Urolithiasis      Review of Systems  Respiratory:  Positive for cough, shortness of breath and wheezing.   Psychiatric/Behavioral:  Positive for dysphoric mood. Negative for suicidal ideas.   All other systems reviewed and are negative.  No Known Allergies Past Surgical History:  Procedure Laterality Date   CATARACT EXTRACTION W/PHACO Left 08/09/2021   Procedure: CATARACT EXTRACTION PHACO AND INTRAOCULAR LENS PLACEMENT (IOC) LEFT .051 00:10.0;  Surgeon: Lockie Mola, MD;  Location: [facility];  Service: Ophthalmology;  Laterality: Left;   CATARACT EXTRACTION W/PHACO Right 10/04/2021   Procedure: CATARACT EXTRACTION PHACO AND INTRAOCULAR LENS PLACEMENT (IOC) RIGHT 0.67 00:13.8;  Surgeon: Lockie Mola, MD;  Location: [facility];  Service: Ophthalmology;  Laterality: Right;  general anesthesia needs to be last case   HERNIA REPAIR     x2   MANDIBLE FRACTURE SURGERY         MEDICATIONS: Current Outpatient Medications  Medication Instructions   buprenorphine-naloxone  (SUBOXONE) 8-2 mg SUBL SL tablet 1 tablet, Sublingual, Daily     docusate sodium  100 mg Oral BID   enoxaparin (LOVENOX) injection  40 mg Subcutaneous Q24H   ipratropium-albuterol  3 mL Nebulization Once   ipratropium-albuterol  3 mL Nebulization Once   ipratropium-albuterol  3 mL Nebulization Once   methylPREDNISolone (SOLU-MEDROL) injection  125 mg Intravenous Once   methylPREDNISolone (SOLU-MEDROL) injection  60 mg Intravenous Q12H   Followed by   Melene Muller ON 01/05/2023] predniSONE  40 mg Oral Q breakfast   [START ON 01/04/2023] nicotine  14 mg Transdermal Daily   sodium chloride flush  3 mL Intravenous Q12H     azithromycin     cefTRIAXone (ROCEPHIN)  IV     lactated ringers     ED Course: Pt in Ed is alert awake oriented afebrile and O2 sats of 85% on room air. Vitals:   01/03/23 2100 01/03/23 2101  BP: 101/80   Pulse: 98   Resp: (!) 22   Temp: 98.5 F (36.9 C)   TempSrc: Oral   SpO2: (!) 85%   Weight:  54.4 kg  Height:   (1.702 m)  No intake/output data recorded. SpO2: (!) 85 % Blood work in ed shows: Potassium of 5.3, glucose of 110 normal kidney function no LFTs. CBC shows a white count of 12.6 normal hemoglobin and platelet count. Troponin of 6. Urinalysis within normal limits. Urine drug screen negative. Respiratory panel negative for flu COVID and RSV.  Results for orders placed or performed during the hospital  encounter of 01/03/23 (from the past 24 hour(s))  Basic metabolic panel     Status: Abnormal   Collection Time: 01/03/23  9:05 PM  Result Value Ref Range   Sodium 139 135 - 145 mmol/L   Potassium 5.3 (H) 3.5 - 5.1 mmol/L   Chloride 97 (L) 98 - 111 mmol/L   CO2 32 22 - 32 mmol/L   Glucose, Bld 110 (H) 70 - 99 mg/dL   BUN 18 6 - 20 mg/dL   Creatinine, Ser 4.090.82 0.61 - 1.24 mg/dL   Calcium 9.2 8.9 - 81.110.3 mg/dL   GFR, Estimated >91>60 >47>60 mL/min   Anion gap 10 5 - 15  CBC     Status: Abnormal   Collection Time: 01/03/23  9:05 PM  Result Value Ref  Range   WBC 12.6 (H) 4.0 - 10.5 K/uL   RBC 5.58 4.22 - 5.81 MIL/uL   Hemoglobin 16.4 13.0 - 17.0 g/dL   HCT 82.952.4 (H) 56.239.0 - 13.052.0 %   MCV 93.9 80.0 - 100.0 fL   MCH 29.4 26.0 - 34.0 pg   MCHC 31.3 30.0 - 36.0 g/dL   RDW 86.514.2 78.411.5 - 69.615.5 %   Platelets 381 150 - 400 K/uL   nRBC 0.0 0.0 - 0.2 %  Troponin I (High Sensitivity)     Status: None   Collection Time: 01/03/23  9:05 PM  Result Value Ref Range   Troponin I (High Sensitivity) 6 <18 ng/L  Urinalysis, Routine w reflex microscopic Urine, Clean Catch     Status: Abnormal   Collection Time: 01/03/23  9:05 PM  Result Value Ref Range   Color, Urine YELLOW (A) YELLOW   APPearance CLEAR (A) CLEAR   Specific Gravity, Urine 1.025 1.005 - 1.030   pH 5.0 5.0 - 8.0   Glucose, UA NEGATIVE NEGATIVE mg/dL   Hgb urine dipstick NEGATIVE NEGATIVE   Bilirubin Urine NEGATIVE NEGATIVE   Ketones, ur 20 (A) NEGATIVE mg/dL   Protein, ur 30 (A) NEGATIVE mg/dL   Nitrite NEGATIVE NEGATIVE   Leukocytes,Ua NEGATIVE NEGATIVE   RBC / HPF 0-5 0 - 5 RBC/hpf   WBC, UA 0-5 0 - 5 WBC/hpf   Bacteria, UA RARE (A) NONE SEEN   Squamous Epithelial / HPF 0-5 0 - 5 /HPF   Mucus PRESENT   Urine Drug Screen, Qualitative ([facility] only)     Status: None   Collection Time: 01/03/23  9:05 PM  Result Value Ref Range   Tricyclic, Ur Screen NONE DETECTED NONE DETECTED   Amphetamines, Ur Screen NONE DETECTED NONE DETECTED   MDMA (Ecstasy)Ur Screen NONE DETECTED NONE DETECTED   Cocaine Metabolite,Ur [place] NONE DETECTED NONE DETECTED   Opiate, Ur Screen NONE DETECTED NONE DETECTED   Phencyclidine (PCP) Ur S NONE DETECTED NONE DETECTED   Cannabinoid 50 Ng, Ur [place] NONE DETECTED NONE DETECTED   Barbiturates, Ur Screen NONE DETECTED NONE DETECTED   Benzodiazepine, Ur Scrn NONE DETECTED NONE DETECTED   Methadone Scn, Ur NONE DETECTED NONE DETECTED  Resp panel by RT-PCR (RSV, Flu A&B, Covid) Anterior Nasal Swab     Status: None   Collection Time: 01/03/23  9:05 PM   Specimen:  Anterior Nasal Swab  Result Value Ref Range   SARS Coronavirus 2 by RT PCR NEGATIVE NEGATIVE   Influenza A by PCR NEGATIVE NEGATIVE   Influenza B by PCR NEGATIVE NEGATIVE   Resp Syncytial Virus by PCR NEGATIVE NEGATIVE  Salicylate level     Status: Abnormal  Collection Time: 01/03/23  9:05 PM  Result Value Ref Range   Salicylate Lvl 36.1 (H) 7.0 - 30.0 mg/dL  Acetaminophen level     Status: Abnormal   Collection Time: 01/03/23  9:05 PM  Result Value Ref Range   Acetaminophen (Tylenol), Serum <10 (L) 10 - 30 ug/mL  Brain natriuretic peptide     Status: None   Collection Time: 01/03/23  9:05 PM  Result Value Ref Range   B Natriuretic Peptide 48.9 0.0 - 100.0 pg/mL  D-dimer, quantitative     Status: Abnormal   Collection Time: 01/03/23  9:05 PM  Result Value Ref Range   D-Dimer, Quant 0.55 (H) 0.00 - 0.50 ug/mL-FEU    Unresulted Labs (From admission, onward)     Start     Ordered   01/03/23 2326  HIV Antibody (routine testing w rflx)  (HIV Antibody (Routine testing w reflex) panel)  Once,   R        01/03/23 2332   01/03/23 2324  Procalcitonin - Baseline  ONCE - URGENT,   URGENT        01/03/23 2323   01/03/23 2300  Blood gas, venous  Once,   STAT        01/03/23 2259   01/03/23 2257  Blood culture (routine x 2)  BLOOD CULTURE X 2,   STAT      01/03/23 2256   01/03/23 2257  Lactic acid, plasma  Now then every 2 hours,   STAT      01/03/23 2256           Pt has received : Orders Placed This Encounter  Procedures   1-3 Lead EKG Interpretation    This order was created via procedure documentation    Standing Status:   Standing    Number of Occurrences:   1   Critical Care    This order was created via procedure documentation    Standing Status:   Standing    Number of Occurrences:   1   Resp panel by RT-PCR (RSV, Flu A&B, Covid) Anterior Nasal Swab    Standing Status:   Standing    Number of Occurrences:   1   Blood culture (routine x 2)    Standing Status:    Standing    Number of Occurrences:   2   DG Chest 2 View    Standing Status:   Standing    Number of Occurrences:   1    Order Specific Question:   Reason for Exam (SYMPTOM  OR DIAGNOSIS REQUIRED)    Answer:   cough   CT Angio Chest Pulmonary Embolism (PE) W or WO Contrast    Standing Status:   Standing    Number of Occurrences:   1    Order Specific Question:   Does the patient have a contrast media/X-ray dye allergy?    Answer:   No    Order Specific Question:   If indicated for the ordered procedure, I authorize the administration of contrast media per Radiology protocol    Answer:   Yes   Basic metabolic panel    Standing Status:   Standing    Number of Occurrences:   1   CBC    Standing Status:   Standing    Number of Occurrences:   1   Urinalysis, Routine w reflex microscopic    Standing Status:   Standing    Number of Occurrences:   1  Urine Drug Screen, Qualitative ([facility] only)    Standing Status:   Standing    Number of Occurrences:   1   Salicylate level    Standing Status:   Standing    Number of Occurrences:   1   Acetaminophen level    Standing Status:   Standing    Number of Occurrences:   1   Lactic acid, plasma    Standing Status:   Standing    Number of Occurrences:   2   Brain natriuretic peptide    Standing Status:   Standing    Number of Occurrences:   1   Blood gas, venous    Standing Status:   Standing    Number of Occurrences:   1   D-dimer, quantitative    Standing Status:   Standing    Number of Occurrences:   1   Procalcitonin - Baseline    Standing Status:   Standing    Number of Occurrences:   1   HIV Antibody (routine testing w rflx)    Standing Status:   Standing    Number of Occurrences:   1   Diet Heart Room service appropriate? Yes; Fluid consistency: Thin    Standing Status:   Standing    Number of Occurrences:   1    Order Specific Question:   Room service appropriate?    Answer:   Yes    Order Specific Question:   Fluid  consistency:    Answer:   Thin   Document Height and Actual Weight    Use scales to weigh patient, not stated or estimated weight.    Standing Status:   Standing    Number of Occurrences:   1   Saline Lock IV, Maintain IV access    Standing Status:   Standing    Number of Occurrences:   1   DO NOT delay antibiotics if unable to obtain blood culture.    Standing Status:   Standing    Number of Occurrences:   1   Refer to Sidebar Report:    Standing Status:   Standing    Number of Occurrences:   1   Apply Chronic Obstructive Pulmonary Disease Care Plan    Standing Status:   Standing    Number of Occurrences:   1   Provide Smoking / Tobacco cessation education.  Provide education material and information on community counseling.    Standing Status:   Standing    Number of Occurrences:   1   Vital signs    Standing Status:   Standing    Number of Occurrences:   1   Notify physician (specify)    Standing Status:   Standing    Number of Occurrences:   20    Order Specific Question:   Notify Physician    Answer:   for pulse less than 55 or greater than 120    Order Specific Question:   Notify Physician    Answer:   for respiratory rate less than 12 or greater than 25    Order Specific Question:   Notify Physician    Answer:   for temperature greater than 100.5 F    Order Specific Question:   Notify Physician    Answer:   for urinary output less than 30 mL/hr for four hours    Order Specific Question:   Notify Physician    Answer:   for systolic BP less than  90 or greater than 160, diastolic BP less than 60 or greater than 100   Progressive Mobility Protocol: No Restrictions    Standing Status:   Standing    Number of Occurrences:   1   If patient diabetic or glucose greater than 140 notify physician for Sliding Scale Insulin Orders    Standing Status:   Standing    Number of Occurrences:   20   Intake and Output    Standing Status:   Standing    Number of Occurrences:   1    Initiate Oral Care Protocol    Standing Status:   Standing    Number of Occurrences:   1   Initiate Carrier Fluid Protocol    Standing Status:   Standing    Number of Occurrences:   1   Nurse to provide smoking / tobacco cessation education    Standing Status:   Standing    Number of Occurrences:   1   RN may order General Admission PRN Orders utilizing "General Admission PRN medications" (through manage orders) for the following patient needs: allergy symptoms (Claritin), cold sores (Carmex), cough (Robitussin DM), eye irritation (Liquifilm Tears), hemorrhoids (Tucks), indigestion (Maalox), minor skin irritation (Hydrocortisone Cream), muscle pain Romeo Apple Gay), nose irritation (saline nasal spray) and sore throat (Chloraseptic spray).    Standing Status:   Standing    Number of Occurrences:   [id number]   Cardiac Monitoring Continuous x 24 hours Indications for use: Other; other indications for use: COPD exacerbation    Standing Status:   Standing    Number of Occurrences:   1    Order Specific Question:   Indications for use:    Answer:   Other    Order Specific Question:   other indications for use:    Answer:   COPD exacerbation   Full code    Standing Status:   Standing    Number of Occurrences:   1    Order Specific Question:   By:    Answer:   Other   Consult to hospitalist    Standing Status:   Standing    Number of Occurrences:   1    Order Specific Question:   Place call to:    Answer:   [id number]    Order Specific Question:   Reason for Consult    Answer:   Admit   Consult to psychiatry    Standing Status:   Standing    Number of Occurrences:   1    Order Specific Question:   Place call to:    Answer:   [id number]    Order Specific Question:   Reason for Consult    Answer:   Admit   Consult to TTS (previously known as ACT)    Standing Status:   Standing    Number of Occurrences:   1    Order Specific Question:   Reason for Consult?    Answer:   [id number]   Code Sepsis  activation.  This occurs automatically when order is signed and prioritizes pharmacy, lab, and radiology services for STAT collections and interventions.  If [facility] downtime, call [facility] [phone]) to activate Code Sepsis.    Standing Status:   Standing    Number of Occurrences:   1    Order Specific Question:   Initiate "Code Sepsis" tracking    Answer:   Yes    Order Specific Question:   Contact eLink ([phone])  Answer:   No    Order Specific Question:   Reason for Consult?    Answer:   tracking   pharmacy consult    Standing Status:   Standing    Number of Occurrences:   1    Order Specific Question:   Reason for Consult (*indicate specifics in comment field)    Answer:   Other (see comment)    Order Specific Question:   Comment:    Answer:   Treatment for SEPSIS has been initiated. Follow patient to expedite timely antibiotic and fluid administration.   Nutritional services consult    Standing Status:   Standing    Number of Occurrences:   1    Order Specific Question:   Reason for Consult?    Answer:   nutritional goals   Consult to Transition of Care Team    Standing Status:   Standing    Number of Occurrences:   1    Order Specific Question:   Reason for Consult:    Answer:   Home Health / DME Needs    Order Specific Question:   Reason for Consult:    Answer:   Heart Failure / COPD Consult   Consult to respiratory care treatment    Standing Status:   Standing    Number of Occurrences:   1    Order Specific Question:   Reason for Consult?    Answer:   evaluation   PT eval and treat    Standing Status:   Standing    Number of Occurrences:   1   Pulse oximetry, continuous    Standing Status:   Standing    Number of Occurrences:   1   Oxygen therapy Mode or (Route): Nasal cannula; Liters Per Minute: 2; Keep 02 saturation: greater than 92 %    Standing Status:   Standing    Number of Occurrences:   1    Order Specific Question:   Mode or (Route)    Answer:    Nasal cannula    Order Specific Question:   Liters Per Minute    Answer:   2    Order Specific Question:   Keep 02 saturation    Answer:   greater than 92 %   ED EKG within 10 minutes    Standing Status:   Standing    Number of Occurrences:   1    Order Specific Question:   Reason for Exam    Answer:   Shortness of breath   EKG 12-Lead    Standing Status:   Standing    Number of Occurrences:   1   Insert 2nd peripheral IV if not already present.    Standing Status:   Standing    Number of Occurrences:   20   Place in observation (patient's expected length of stay will be less than 2 midnights)    Standing Status:   Standing    Number of Occurrences:   1    Order Specific Question:   Hospital Area    Answer:   [facility] [[id number]]    Order Specific Question:   Level of Care    Answer:   Telemetry Medical [104]    Order Specific Question:   Covid Evaluation    Answer:   Confirmed COVID Negative    Order Specific Question:   Diagnosis    Answer:   Cough [email or web address].Pel[786.2.ICD-9-CM]    Order Specific Question:  Admitting Physician    Answer:   Darrold Junker    Order Specific Question:   Attending Physician    Answer:   Darrold Junker    Meds ordered this encounter  Medications   ipratropium-albuterol (DUONEB) 0.5-2.5 (3) MG/3ML nebulizer solution 3 mL   methylPREDNISolone sodium succinate (SOLU-MEDROL) 125 mg/2 mL injection 125 mg    IV methylprednisolone will be converted to either a q12h or q24h frequency with the same total daily dose (TDD).  Ordered Dose: 1 to 125 mg TDD; convert to: TDD q24h.  Ordered Dose: 126 to 250 mg TDD; convert to: TDD div q12h.  Ordered Dose: >250 mg TDD; DAW.   ipratropium-albuterol (DUONEB) 0.5-2.5 (3) MG/3ML nebulizer solution 3 mL   ipratropium-albuterol (DUONEB) 0.5-2.5 (3) MG/3ML nebulizer solution 3 mL   cefTRIAXone (ROCEPHIN) 2 g in sodium chloride 0.9 % 100 mL IVPB    Order Specific Question:   Antibiotic  Indication:    Answer:   CAP   azithromycin (ZITHROMAX) 500 mg in sodium chloride 0.9 % 250 mL IVPB    Order Specific Question:   Antibiotic Indication:    Answer:   CAP   guaiFENesin (ROBITUSSIN) 100 MG/5ML liquid 5 mL   FOLLOWED BY Linked Order Group    methylPREDNISolone sodium succinate (SOLU-MEDROL) 125 mg/2 mL injection 60 mg     IV methylprednisolone will be converted to either a q12h or q24h frequency with the same total daily dose (TDD).  Ordered Dose: 1 to 125 mg TDD; convert to: TDD q24h.  Ordered Dose: 126 to 250 mg TDD; convert to: TDD div q12h.  Ordered Dose: >250 mg TDD; DAW.    predniSONE (DELTASONE) tablet 40 mg   enoxaparin (LOVENOX) injection 40 mg   sodium chloride flush (NS) 0.9 % injection 3 mL   lactated ringers infusion   OR Linked Order Group    acetaminophen (TYLENOL) tablet 650 mg    acetaminophen (TYLENOL) suppository 650 mg   HYDROcodone-acetaminophen (NORCO/VICODIN) 5-325 MG per tablet 1-2 tablet   docusate sodium (COLACE) capsule 100 mg   polyethylene glycol (MIRALAX / GLYCOLAX) packet 17 g   bisacodyl (DULCOLAX) EC tablet 5 mg   OR Linked Order Group    ondansetron (ZOFRAN) tablet 4 mg    ondansetron (ZOFRAN) injection 4 mg   nicotine (NICODERM CQ - dosed in mg/24 hours) patch 14 mg   hydrALAZINE (APRESOLINE) injection 5 mg  EKG:    Admission Imaging : DG Chest 2 View  Result Date: 01/03/2023 CLINICAL DATA:  Cough, hypoxia EXAM: CHEST - 2 VIEW COMPARISON:  04/16/2020 FINDINGS: Frontal and lateral views of the chest demonstrate an unremarkable cardiac silhouette. Lungs are hyperinflated with background emphysema. There is increased bilateral bronchovascular prominence in a perihilar distribution, which may reflect bronchitis or viral pneumonitis. No lobar consolidation, effusion, or pneumothorax. No acute bony abnormalities. IMPRESSION: 1. Bilateral perihilar bronchovascular prominence consistent with bronchitis or viral pneumonitis. No lobar  consolidation. 2. Emphysema. Electronically Signed   By: Sharlet Salina M.D.   On: 01/03/2023 21:29   Physical Examination: Vitals:   01/03/23 2100 01/03/23 2101  BP: 101/80   Pulse: 98   Temp: 98.5 F (36.9 C)   Resp: (!) 22   Height:  5\' 7"  (1.702 m)  Weight:  54.4 kg  SpO2: (!) 85%   TempSrc: Oral   BMI (Calculated):  18.79   Physical Exam Vitals and nursing note reviewed.  Constitutional:      General: He  is not in acute distress.    Appearance: He is not ill-appearing, toxic-appearing or diaphoretic.  HENT:     Head: Normocephalic and atraumatic.     Right Ear: Hearing and external ear normal.     Left Ear: Hearing and external ear normal.     Nose: No nasal deformity.     Mouth/Throat:     Lips: Pink.     Mouth: Mucous membranes are moist.     Tongue: No lesions.  Eyes:     Extraocular Movements: Extraocular movements intact.  Neck:     Vascular: No carotid bruit.  Cardiovascular:     Rate and Rhythm: Normal rate and regular rhythm.     Pulses: Normal pulses.     Heart sounds: Normal heart sounds.  Pulmonary:     Effort: Pulmonary effort is normal.     Breath sounds: Wheezing present.  Abdominal:     General: Bowel sounds are normal. There is no distension.     Palpations: Abdomen is soft. There is no mass.     Tenderness: There is no abdominal tenderness. There is no guarding.     Hernia: No hernia is present.  Musculoskeletal:     Right lower leg: No edema.     Left lower leg: No edema.  Skin:    General: Skin is warm.  Neurological:     General: No focal deficit present.     Mental Status: He is alert and oriented to person, place, and time.     Cranial Nerves: Cranial nerves 2-12 are intact.     Motor: Motor function is intact.  Psychiatric:        Attention and Perception: Attention normal.        Mood and Affect: Mood normal.        Speech: Speech normal.        Behavior: Behavior normal. Behavior is cooperative.        Cognition and Memory:  Cognition normal.     Assessment and Plan: * Cough .  Acute respiratory failure with hypoxia (HCC) .  COPD with acute exacerbation (HCC) .  Tobacco abuse .   PLAN: -Prn antitussives.  -COPD order set : steroids/ MDI and nebs.  -IV abx for BS coverage as pt meets sepsis criteria- LA pending.  -MIVF/ IV PPI.  -Supplemental oxygen and ambulatory pulse ox prior to discharge to assess home O2 need. -Tobacco cessation counseling/ nicotine patch.  -Monitor electrolytes.   DVT prophylaxis:  Heparin   Code Status:  Full code   Family Communication:  Benay SpiceKing,Tracy (Sister) [phone]   Disposition Plan:  Home   Consults called:  Psychiatry per EDMD.   Admission status: Observation   Unit/ Expected LOS: Telemetry / 2 days.    Gertha CalkinEkta V Aymar Whitfill MD [facility]  6 PM- 2 AM. Please contact me via secure Chat 6 PM-2 AM. [phone]( Pager ) To contact the [facility] Attending or Consulting provider 7A - 7P or covering provider during after hours 7P -7A, for this patient.   Check the care team in [facility] and look for a) attending/consulting [facility] provider listed and b) the [facility] team listed Log into [email or web address] and use 's universal password to access. If you do not have the password, please contact the hospital operator. Locate the [facility] provider you are looking for under [facility] and page to a number that you can be directly reached. If you still have difficulty reaching the provider, please page the [facility] (  Director on Call) for the Hospitalists listed on amion for assistance. [email or web address] 01/03/2023, 11:32 PM

## 2023-01-04 ENCOUNTER — Observation Stay: Payer: Self-pay

## 2023-01-04 DIAGNOSIS — E43 Unspecified severe protein-calorie malnutrition: Secondary | ICD-10-CM | POA: Insufficient documentation

## 2023-01-04 DIAGNOSIS — L8990 Pressure ulcer of unspecified site, unspecified stage: Secondary | ICD-10-CM | POA: Insufficient documentation

## 2023-01-04 DIAGNOSIS — J209 Acute bronchitis, unspecified: Secondary | ICD-10-CM

## 2023-01-04 DIAGNOSIS — F331 Major depressive disorder, recurrent, moderate: Secondary | ICD-10-CM | POA: Diagnosis present

## 2023-01-04 LAB — SALICYLATE LEVEL: Salicylate Lvl: 21.6 mg/dL (ref 7.0–30.0)

## 2023-01-04 LAB — BLOOD GAS, VENOUS
Acid-Base Excess: 5.8 mmol/L — ABNORMAL HIGH (ref 0.0–2.0)
Bicarbonate: 32.5 mmol/L — ABNORMAL HIGH (ref 20.0–28.0)
O2 Saturation: 96 %
Patient temperature: 37
pCO2, Ven: 55 mmHg (ref 44–60)
pH, Ven: 7.38 (ref 7.25–7.43)
pO2, Ven: 72 mmHg — ABNORMAL HIGH (ref 32–45)

## 2023-01-04 LAB — BASIC METABOLIC PANEL
Anion gap: 11 (ref 5–15)
BUN: 17 mg/dL (ref 6–20)
CO2: 29 mmol/L (ref 22–32)
Calcium: 8.4 mg/dL — ABNORMAL LOW (ref 8.9–10.3)
Chloride: 97 mmol/L — ABNORMAL LOW (ref 98–111)
Creatinine, Ser: 0.71 mg/dL (ref 0.61–1.24)
GFR, Estimated: >60 mL/min (ref >60)
Glucose, Bld: 237 mg/dL — ABNORMAL HIGH (ref 70–99)
Potassium: 4 mmol/L (ref 3.5–5.1)
Sodium: 137 mmol/L (ref 135–145)

## 2023-01-04 LAB — LACTIC ACID, PLASMA
Lactic Acid, Venous: 1.1 mmol/L (ref 0.5–1.9)
Lactic Acid, Venous: 2 mmol/L (ref 0.5–1.9)

## 2023-01-04 LAB — HIV ANTIBODY (ROUTINE TESTING W REFLEX): HIV Screen 4th Generation wRfx: NONREACTIVE

## 2023-01-04 LAB — CK: Total CK: 29 U/L — ABNORMAL LOW (ref 49–397)

## 2023-01-04 LAB — EXPECTORATED SPUTUM ASSESSMENT W GRAM STAIN, RFLX TO RESP C

## 2023-01-04 LAB — TROPONIN I (HIGH SENSITIVITY): Troponin I (High Sensitivity): 7 ng/L (ref <18)

## 2023-01-04 LAB — HEPATITIS B SURFACE ANTIGEN: Hepatitis B Surface Ag: NONREACTIVE

## 2023-01-04 LAB — PROCALCITONIN: Procalcitonin: <0.1 ng/mL

## 2023-01-04 MED ORDER — ADULT MULTIVITAMIN W/MINERALS CH
1.0000 | ORAL_TABLET | Freq: Every day | ORAL | Status: DC
  Administered 2023-01-04 – 2023-01-07 (×4): 1 via ORAL
  Filled 2023-01-04 (×4): qty 1

## 2023-01-04 MED ORDER — IOHEXOL 350 MG/ML SOLN
75.0000 mL | Freq: Once | INTRAVENOUS | Status: AC | PRN
  Administered 2023-01-04: 75 mL via INTRAVENOUS

## 2023-01-04 MED ORDER — AZITHROMYCIN 250 MG PO TABS
250.0000 mg | ORAL_TABLET | Freq: Every day | ORAL | Status: DC
  Administered 2023-01-05 – 2023-01-07 (×3): 250 mg via ORAL
  Filled 2023-01-04 (×3): qty 1

## 2023-01-04 MED ORDER — BUPRENORPHINE HCL-NALOXONE HCL 2-0.5 MG SL SUBL
1.0000 | SUBLINGUAL_TABLET | Freq: Three times a day (TID) | SUBLINGUAL | Status: DC
  Administered 2023-01-04 – 2023-01-07 (×11): 1 via SUBLINGUAL
  Filled 2023-01-04 (×11): qty 1

## 2023-01-04 MED ORDER — IPRATROPIUM-ALBUTEROL 0.5-2.5 (3) MG/3ML IN SOLN
3.0000 mL | Freq: Three times a day (TID) | RESPIRATORY_TRACT | Status: DC
  Administered 2023-01-04 – 2023-01-06 (×6): 3 mL via RESPIRATORY_TRACT
  Filled 2023-01-04 (×6): qty 3

## 2023-01-04 MED ORDER — ESCITALOPRAM OXALATE 10 MG PO TABS
5.0000 mg | ORAL_TABLET | Freq: Every day | ORAL | Status: DC
  Administered 2023-01-04 – 2023-01-07 (×4): 5 mg via ORAL
  Filled 2023-01-04 (×4): qty 1

## 2023-01-04 MED ORDER — ENSURE ENLIVE PO LIQD
237.0000 mL | Freq: Three times a day (TID) | ORAL | Status: DC
  Administered 2023-01-04 – 2023-01-06 (×6): 237 mL via ORAL

## 2023-01-04 MED ORDER — IPRATROPIUM-ALBUTEROL 0.5-2.5 (3) MG/3ML IN SOLN: 3.0000 mL | Freq: Three times a day (TID) | RESPIRATORY_TRACT | Status: DC

## 2023-01-04 NOTE — Consult Note (Signed)
[facility] Consult   Reason for Consult:  suicidal ideations Referring Physician:  Dr Si Raider Patient Identification: Corey House. MRN:  [id number] Principal Diagnosis: Cough Diagnosis:  Principal Problem:   Cough Active Problems:   COPD with acute exacerbation (HCC)   Tobacco abuse   Acute respiratory failure with hypoxia (HCC)   Major depressive disorder, recurrent episode, moderate (HCC)   Pressure injury of skin   Total Time spent with patient: 45 minutes  Subjective: "I want to be happier." Corey House. is a 57 y.o. male patient admitted with cough. Psych team consult was requested for suicidal ideations.  HPI:  On evaluation, patient was primarily focused on his medical concerns including bronchitis, weakness, and alternating heat and cold intolerance. On evaluation, he was anxious appearing, sitting up in bed, and watching TV. He expressed not understanding why he has had bronchitis three times in the past year and reported feeling frustrated about his living situation and lack of support system. Patient stated his brother and mother died within the past year, and his mood over the last few months has worsened as a result. He also reported his main problem is feeling anxious and constantly worried, stating, "I get scared." Denied currently suicidal ideations, homicidal ideations, and hallucinations. He denied using alcohol and other illicit drugs but reported smoking one pack per day until a couple days before admission, when his use was restricted by his respiratory status.   Patient reported he has taken Suboxone for 15 years, but a PDMP search revealed he has not picked up his prescription since August 2023. He also reported discontinuing Paxil 4 months ago; he had taken Paxil for years, then discontinued it, and found it was not effective when he restarted it. Patient reported taking Prozac previously, but developed suicidal ideations and it was  discontinued. He has also tried other unknown antidepressants in the past, which were also ineffective. Patient reported he saw a therapist a few years ago and would like to try therapy again. He also expressed a desire to quit smoking. Lexapro started with his permission with agreed follow up at [facility].  Past Psychiatric History: Anxiety and depression, as above   Risk to Self: None  Risk to Others: None Prior Inpatient Therapy: None  Prior Outpatient Therapy: Described above   Past Medical History:  Past Medical History:  Diagnosis Date   Chronic cough    Urolithiasis    Urolithiasis     Past Surgical History:  Procedure Laterality Date   CATARACT EXTRACTION W/PHACO Left 08/09/2021   Procedure: CATARACT EXTRACTION PHACO AND INTRAOCULAR LENS PLACEMENT (IOC) LEFT .051 00:10.0;  Surgeon: Leandrew Koyanagi, MD;  Location: [facility];  Service: Ophthalmology;  Laterality: Left;   CATARACT EXTRACTION W/PHACO Right 10/04/2021   Procedure: CATARACT EXTRACTION PHACO AND INTRAOCULAR LENS PLACEMENT (IOC) RIGHT 0.67 00:13.8;  Surgeon: Leandrew Koyanagi, MD;  Location: [facility];  Service: Ophthalmology;  Laterality: Right;  general anesthesia needs to be last case   HERNIA REPAIR     x2   MANDIBLE FRACTURE SURGERY     Family History: History reviewed. No pertinent family history. Family Psychiatric  History: unknown Social History:  Social History   Substance and Sexual Activity  Alcohol Use No     Social History   Substance and Sexual Activity  Drug Use Not on file    Social History   Socioeconomic History   Marital status: Single    Spouse name: Not on file  Number of children: Not on file   Years of education: Not on file   Highest education level: Not on file  Occupational History   Not on file  Tobacco Use   Smoking status: Every Day    Packs/day: 1.00    Years: 35.00    Total pack years: 35.00    Types: Cigarettes   Smokeless tobacco: Never    Tobacco comments:    Started smoking age 71  Vaping Use   Vaping Use: Never used  Substance and Sexual Activity   Alcohol use: No   Drug use: Not on file   Sexual activity: Not on file  Other Topics Concern   Not on file  Social History Narrative   Not on file   Social Determinants of Health   Financial Resource Strain: Not on file  Food Insecurity: Food Insecurity Present (01/04/2023)   Hunger Vital Sign    Worried About Running Out of Food in the Last Year: Sometimes true    Ran Out of Food in the Last Year: Sometimes true  Transportation Needs: No Transportation Needs (01/04/2023)   PRAPARE - Hydrologist (Medical): No    Lack of Transportation (Non-Medical): No  Physical Activity: Not on file  Stress: Not on file  Social Connections: Not on file   Additional Social History:    Allergies:  No Known Allergies  Labs:  Results for orders placed or performed during the hospital encounter of 01/03/23 (from the past 48 hour(s))  Basic metabolic panel     Status: Abnormal   Collection Time: 01/03/23  9:05 PM  Result Value Ref Range   Sodium 139 135 - 145 mmol/L   Potassium 5.3 (H) 3.5 - 5.1 mmol/L   Chloride 97 (L) 98 - 111 mmol/L   CO2 32 22 - 32 mmol/L   Glucose, Bld 110 (H) 70 - 99 mg/dL    Comment: Glucose reference range applies only to samples taken after fasting for at least 8 hours.   BUN 18 6 - 20 mg/dL   Creatinine, Ser 0.82 0.61 - 1.24 mg/dL   Calcium 9.2 8.9 - 10.3 mg/dL   GFR, Estimated >60 >60 mL/min    Comment: (NOTE) Calculated using the CKD-EPI Creatinine Equation (2021)    Anion gap 10 5 - 15    Comment: Performed at [facility], [address] [place], [place] [address]  CBC     Status: Abnormal   Collection Time: 01/03/23  9:05 PM  Result Value Ref Range   WBC 12.6 (H) 4.0 - 10.5 K/uL   RBC 5.58 4.22 - 5.81 MIL/uL   Hemoglobin 16.4 13.0 - 17.0 g/dL   HCT 52.4 (H) 39.0 - 52.0 %   MCV 93.9 80.0 - 100.0 fL    MCH 29.4 26.0 - 34.0 pg   MCHC 31.3 30.0 - 36.0 g/dL   RDW 14.2 11.5 - 15.5 %   Platelets 381 150 - 400 K/uL   nRBC 0.0 0.0 - 0.2 %    Comment: Performed at [facility], [place], [place] [address]  Troponin I (High Sensitivity)     Status: None   Collection Time: 01/03/23  9:05 PM  Result Value Ref Range   Troponin I (High Sensitivity) 6 <18 ng/L    Comment: (NOTE) Elevated high sensitivity troponin I (hsTnI) values and significant  changes across serial measurements may suggest ACS but many other  chronic and acute conditions are  known to elevate hsTnI results.  Refer to the "Links" section for chest pain algorithms and additional  guidance. Performed at [facility], [address] [place], [place] [address]   Urinalysis, Routine w reflex microscopic Urine, Clean Catch     Status: Abnormal   Collection Time: 01/03/23  9:05 PM  Result Value Ref Range   Color, Urine YELLOW (A) YELLOW   APPearance CLEAR (A) CLEAR   Specific Gravity, Urine 1.025 1.005 - 1.030   pH 5.0 5.0 - 8.0   Glucose, UA NEGATIVE NEGATIVE mg/dL   Hgb urine dipstick NEGATIVE NEGATIVE   Bilirubin Urine NEGATIVE NEGATIVE   Ketones, ur 20 (A) NEGATIVE mg/dL   Protein, ur 30 (A) NEGATIVE mg/dL   Nitrite NEGATIVE NEGATIVE   Leukocytes,Ua NEGATIVE NEGATIVE   RBC / HPF 0-5 0 - 5 RBC/hpf   WBC, UA 0-5 0 - 5 WBC/hpf   Bacteria, UA RARE (A) NONE SEEN   Squamous Epithelial / HPF 0-5 0 - 5 /HPF   Mucus PRESENT     Comment: Performed at [facility], [address] [place], [place] [address]  Urine Drug Screen, Qualitative ([facility] only)     Status: None   Collection Time: 01/03/23  9:05 PM  Result Value Ref Range   Tricyclic, Ur Screen NONE DETECTED NONE DETECTED   Amphetamines, Ur Screen NONE DETECTED NONE DETECTED   MDMA (Ecstasy)Ur Screen NONE DETECTED NONE DETECTED   Cocaine Metabolite,Ur [facility] NONE DETECTED NONE DETECTED   Opiate, Ur Screen NONE DETECTED NONE  DETECTED   Phencyclidine (PCP) Ur S NONE DETECTED NONE DETECTED   Cannabinoid 50 Ng, Ur  NONE DETECTED NONE DETECTED   Barbiturates, Ur Screen NONE DETECTED NONE DETECTED   Benzodiazepine, Ur Scrn NONE DETECTED NONE DETECTED   Methadone Scn, Ur NONE DETECTED NONE DETECTED    Comment: (NOTE) Tricyclics + metabolites, urine    Cutoff 1000 ng/mL Amphetamines + metabolites, urine  Cutoff 1000 ng/mL MDMA (Ecstasy), urine              Cutoff 500 ng/mL Cocaine Metabolite, urine          Cutoff 300 ng/mL Opiate + metabolites, urine        Cutoff 300 ng/mL Phencyclidine (PCP), urine         Cutoff 25 ng/mL Cannabinoid, urine                 Cutoff 50 ng/mL Barbiturates + metabolites, urine  Cutoff 200 ng/mL Benzodiazepine, urine              Cutoff 200 ng/mL Methadone, urine                   Cutoff 300 ng/mL  The urine drug screen provides only a preliminary, unconfirmed analytical test result and should not be used for non-medical purposes. Clinical consideration and professional judgment should be applied to any positive drug screen result due to possible interfering substances. A more specific alternate chemical method must be used in order to obtain a confirmed analytical result. Gas chromatography / mass spectrometry (GC/MS) is the preferred confirm atory method. Performed at [facility], [address] [place], [place] [address]   Resp panel by RT-PCR (RSV, Flu A&B, Covid) Anterior Nasal Swab     Status: None   Collection Time: 01/03/23  9:05 PM   Specimen: Anterior Nasal Swab  Result Value Ref Range   SARS Coronavirus 2 by RT PCR NEGATIVE NEGATIVE  Comment: (NOTE) SARS-CoV-2 target nucleic acids are NOT DETECTED.  The SARS-CoV-2 RNA is generally detectable in upper respiratory specimens during the acute phase of infection. The lowest concentration of SARS-CoV-2 viral copies this assay can detect is 138 copies/mL. A negative result does not preclude  SARS-Cov-2 infection and should not be used as the sole basis for treatment or other patient management decisions. A negative result may occur with  improper specimen collection/handling, submission of specimen other than nasopharyngeal swab, presence of viral mutation(s) within the areas targeted by this assay, and inadequate number of viral copies(<138 copies/mL). A negative result must be combined with clinical observations, patient history, and epidemiological information. The expected result is Negative.  Fact Sheet for Patients:  BloggerCourse.com  Fact Sheet for Healthcare Providers:  SeriousBroker.it  This test is no t yet approved or cleared by the [place] FDA and  has been authorized for detection and/or diagnosis of SARS-CoV-2 by FDA under an Emergency Use Authorization (EUA). This EUA will remain  in effect (meaning this test can be used) for the duration of the COVID-19 declaration under Section 564(b)(1) of the Act, 21 U.S.C.section 360bbb-3(b)(1), unless the authorization is terminated  or revoked sooner.       Influenza A by PCR NEGATIVE NEGATIVE   Influenza B by PCR NEGATIVE NEGATIVE    Comment: (NOTE) The Xpert Xpress SARS-CoV-2/FLU/RSV plus assay is intended as an aid in the diagnosis of influenza from Nasopharyngeal swab specimens and should not be used as a sole basis for treatment. Nasal washings and aspirates are unacceptable for Xpert Xpress SARS-CoV-2/FLU/RSV testing.  Fact Sheet for Patients: BloggerCourse.com  Fact Sheet for Healthcare Providers: SeriousBroker.it  This test is not yet approved or cleared by the [place] FDA and has been authorized for detection and/or diagnosis of SARS-CoV-2 by FDA under an Emergency Use Authorization (EUA). This EUA will remain in effect (meaning this test can be used) for the duration of the COVID-19  declaration under Section 564(b)(1) of the Act, 21 U.S.C. section 360bbb-3(b)(1), unless the authorization is terminated or revoked.     Resp Syncytial Virus by PCR NEGATIVE NEGATIVE    Comment: (NOTE) Fact Sheet for Patients: BloggerCourse.com  Fact Sheet for Healthcare Providers: SeriousBroker.it  This test is not yet approved or cleared by the [place] FDA and has been authorized for detection and/or diagnosis of SARS-CoV-2 by FDA under an Emergency Use Authorization (EUA). This EUA will remain in effect (meaning this test can be used) for the duration of the COVID-19 declaration under Section 564(b)(1) of the Act, 21 U.S.C. section 360bbb-3(b)(1), unless the authorization is terminated or revoked.  Performed at [facility], [address] [place], [place] [address]   Salicylate level     Status: Abnormal   Collection Time: 01/03/23  9:05 PM  Result Value Ref Range   Salicylate Lvl 36.1 (H) 7.0 - 30.0 mg/dL    Comment: Performed at [facility], [address] [place], [place] [address]  Acetaminophen level     Status: Abnormal   Collection Time: 01/03/23  9:05 PM  Result Value Ref Range   Acetaminophen (Tylenol), Serum <10 (L) 10 - 30 ug/mL    Comment: (NOTE) Therapeutic concentrations vary significantly. A range of 10-30 ug/mL  may be an effective concentration for many patients. However, some  are best treated at concentrations outside of this range. Acetaminophen concentrations >150 ug/mL at 4 hours after ingestion  and >50 ug/mL at 12 hours after ingestion are  often associated with  toxic reactions.  Performed at [facility], [address] [place], [place] [address]   Brain natriuretic peptide     Status: None   Collection Time: 01/03/23  9:05 PM  Result Value Ref Range   B Natriuretic Peptide 48.9 0.0 - 100.0 pg/mL    Comment: Performed at [facility], [address] [place], [place] [address]  D-dimer, quantitative     Status: Abnormal   Collection Time: 01/03/23  9:05 PM  Result Value Ref Range   D-Dimer, Quant 0.55 (H) 0.00 - 0.50 ug/mL-FEU    Comment: (NOTE) At the manufacturer cut-off value of 0.5 g/mL FEU, this assay has a negative predictive value of 95-100%.This assay is intended for use in conjunction with a clinical pretest probability (PTP) assessment model to exclude pulmonary embolism (PE) and deep venous thrombosis (DVT) in outpatients suspected of PE or DVT. Results should be correlated with clinical presentation. Performed at [facility], [address] [place], [place] [address]   Troponin I (High Sensitivity)     Status: None   Collection Time: 01/04/23 12:50 AM  Result Value Ref Range   Troponin I (High Sensitivity) 7 <18 ng/L    Comment: (NOTE) Elevated high sensitivity troponin I (hsTnI) values and significant  changes across serial measurements may suggest ACS but many other  chronic and acute conditions are known to elevate hsTnI results.  Refer to the "Links" section for chest pain algorithms and additional  guidance. Performed at [facility], [address] [place], [place] [address]   Blood culture (routine x 2)     Status: None (Preliminary result)   Collection Time: 01/04/23 12:50 AM   Specimen: BLOOD  Result Value Ref Range   Specimen Description BLOOD RIGTH FA    Special Requests      BOTTLES DRAWN AEROBIC AND ANAEROBIC Blood Culture adequate volume   Culture      NO GROWTH < 12 HOURS Performed at [facility], [address] [place], [place] [address]    Report Status PENDING   Blood culture (routine x 2)     Status: None (Preliminary result)   Collection Time: 01/04/23 12:50 AM   Specimen: BLOOD  Result Value Ref Range   Specimen Description BLOOD RIGHT AC    Special Requests      BOTTLES DRAWN AEROBIC AND ANAEROBIC Blood Culture adequate volume    Culture      NO GROWTH < 12 HOURS Performed at [facility], [address] [place], [place] [address]    Report Status PENDING   Lactic acid, plasma     Status: None   Collection Time: 01/04/23 12:50 AM  Result Value Ref Range   Lactic Acid, Venous 1.1 0.5 - 1.9 mmol/L    Comment: Performed at [facility], [address] [place], [place] [address]  Procalcitonin - Baseline     Status: None   Collection Time: 01/04/23 12:50 AM  Result Value Ref Range   Procalcitonin <0.10 ng/mL    Comment:        Interpretation: PCT (Procalcitonin) <= 0.5 ng/mL: Systemic infection (sepsis) is not likely. Local bacterial infection is possible. (NOTE)       Sepsis PCT Algorithm           Lower Respiratory Tract  Infection PCT Algorithm    ----------------------------     ----------------------------         PCT < 0.25 ng/mL                PCT < 0.10 ng/mL          Strongly encourage             Strongly discourage   discontinuation of antibiotics    initiation of antibiotics    ----------------------------     -----------------------------       PCT 0.25 - 0.50 ng/mL            PCT 0.10 - 0.25 ng/mL               OR       >80% decrease in PCT            Discourage initiation of                                            antibiotics      Encourage discontinuation           of antibiotics    ----------------------------     -----------------------------         PCT >= 0.50 ng/mL              PCT 0.26 - 0.50 ng/mL               AND        <80% decrease in PCT             Encourage initiation of                                             antibiotics       Encourage continuation           of antibiotics    ----------------------------     -----------------------------        PCT >= 0.50 ng/mL                  PCT > 0.50 ng/mL               AND         increase in PCT                  Strongly encourage                                       initiation of antibiotics    Strongly encourage escalation           of antibiotics                                     -----------------------------                                           PCT <= 0.25 ng/mL  OR                                        > 80% decrease in PCT                                      Discontinue / Do not initiate                                             antibiotics  Performed at [facility], [address] [place], [place] [address]   HIV Antibody (routine testing w rflx)     Status: None   Collection Time: 01/04/23 12:50 AM  Result Value Ref Range   HIV Screen 4th Generation wRfx Non Reactive Non Reactive    Comment: Performed at [facility], [address] [place], [place] [address]  Lactic acid, plasma     Status: Abnormal   Collection Time: 01/04/23  2:54 AM  Result Value Ref Range   Lactic Acid, Venous 2.0 (HH) 0.5 - 1.9 mmol/L    Comment: CRITICAL RESULT CALLED TO, READ BACK BY AND VERIFIED WITH EDNA GARNETT @0323  ON 01/04/23 SKL Performed at [facility], [address] [place], [place] [address]   Blood gas, venous     Status: Abnormal   Collection Time: 01/04/23  2:54 AM  Result Value Ref Range   pH, Ven 7.38 7.25 - 7.43   pCO2, Ven 55 44 - 60 mmHg   pO2, Ven 72 (H) 32 - 45 mmHg   Bicarbonate 32.5 (H) 20.0 - 28.0 mmol/L   Acid-Base Excess 5.8 (H) 0.0 - 2.0 mmol/L   O2 Saturation 96 %   Patient temperature 37.0    Collection site VEIN     Comment: Performed at [facility], [address] [place], [place] [address]  Salicylate level     Status: None   Collection Time: 01/04/23 10:52 AM  Result Value Ref Range   Salicylate Lvl 21.6 7.0 - 30.0 mg/dL    Comment: Performed at [facility], [address] [place], [place] [address]  Basic metabolic panel     Status: Abnormal   Collection Time: 01/04/23 10:53 AM  Result Value Ref Range    Sodium 137 135 - 145 mmol/L   Potassium 4.0 3.5 - 5.1 mmol/L   Chloride 97 (L) 98 - 111 mmol/L   CO2 29 22 - 32 mmol/L   Glucose, Bld 237 (H) 70 - 99 mg/dL    Comment: Glucose reference range applies only to samples taken after fasting for at least 8 hours.   BUN 17 6 - 20 mg/dL   Creatinine, Ser 1.020.71 0.61 - 1.24 mg/dL   Calcium 8.4 (L) 8.9 - 10.3 mg/dL   GFR, Estimated >72>60 >53>60 mL/min    Comment: (NOTE) Calculated using the CKD-EPI Creatinine Equation (2021)    Anion gap 11 5 - 15    Comment: Performed at [facility], [address] [place], [place] [address]  CK     Status: Abnormal   Collection Time: 01/04/23 10:53 AM  Result Value Ref Range   Total CK 29 (L) 49 - 397 U/L  Comment: Performed at [facility], [address] [place], [place] [address]    Current Facility-Administered Medications  Medication Dose Route Frequency Provider Last Rate Last Admin   acetaminophen (TYLENOL) tablet 650 mg  650 mg Oral Q6H PRN Gertha Calkin, MD   650 mg at 01/04/23 1057   Or   acetaminophen (TYLENOL) suppository 650 mg  650 mg Rectal Q6H PRN Gertha Calkin, MD       [START ON 01/05/2023] azithromycin (ZITHROMAX) tablet 250 mg  250 mg Oral Daily Wouk, Wilfred Curtis, MD       bisacodyl (DULCOLAX) EC tablet 5 mg  5 mg Oral Daily PRN Gertha Calkin, MD       buprenorphine-naloxone (SUBOXONE) 2-0.5 mg per SL tablet 1 tablet  1 tablet Sublingual TID Kathrynn Running, MD   1 tablet at 01/04/23 1057   docusate sodium (COLACE) capsule 100 mg  100 mg Oral BID Irena Cords V, MD   100 mg at 01/04/23 0300   enoxaparin (LOVENOX) injection 40 mg  40 mg Subcutaneous Q24H Irena Cords V, MD   40 mg at 01/04/23 0302   escitalopram (LEXAPRO) tablet 5 mg  5 mg Oral Daily Charm Rings, NP       feeding supplement (ENSURE ENLIVE / ENSURE PLUS) liquid 237 mL  237 mL Oral TID BM Wouk, Wilfred Curtis, MD   237 mL at 01/04/23 1057   guaiFENesin (ROBITUSSIN) 100 MG/5ML liquid 5 mL  5 mL Oral Q4H  PRN Gertha Calkin, MD       hydrALAZINE (APRESOLINE) injection 5 mg  5 mg Intravenous Q4H PRN Gertha Calkin, MD       ipratropium-albuterol (DUONEB) 0.5-2.5 (3) MG/3ML nebulizer solution 3 mL  3 mL Nebulization TID Kathrynn Running, MD   3 mL at 01/04/23 1308   methylPREDNISolone sodium succinate (SOLU-MEDROL) 125 mg/2 mL injection 60 mg  60 mg Intravenous Q12H Irena Cords V, MD   60 mg at 01/04/23 1057   Followed by   Melene Muller ON 01/05/2023] predniSONE (DELTASONE) tablet 40 mg  40 mg Oral Q breakfast Gertha Calkin, MD       multivitamin with minerals tablet 1 tablet  1 tablet Oral Daily Wouk, Wilfred Curtis, MD   1 tablet at 01/04/23 1057   nicotine (NICODERM CQ - dosed in mg/24 hours) patch 14 mg  14 mg Transdermal Daily Irena Cords V, MD   14 mg at 01/04/23 0813   ondansetron (ZOFRAN) tablet 4 mg  4 mg Oral Q6H PRN Gertha Calkin, MD       Or   ondansetron (ZOFRAN) injection 4 mg  4 mg Intravenous Q6H PRN Gertha Calkin, MD       polyethylene glycol (MIRALAX / GLYCOLAX) packet 17 g  17 g Oral Daily PRN Gertha Calkin, MD       sodium chloride flush (NS) 0.9 % injection 3 mL  3 mL Intravenous Q12H Gertha Calkin, MD   3 mL at 01/04/23 0302    Musculoskeletal: Strength & Muscle Tone: decreased Gait & Station:  did not witness Patient leans: N/A  Psychiatric Specialty Exam: Physical Exam Vitals and nursing note reviewed.  Constitutional:      Appearance: Normal appearance.  HENT:     Head: Normocephalic.     Nose: Nose normal.  Pulmonary:     Effort: Pulmonary effort is normal.  Musculoskeletal:        General: Normal range  of motion.     Cervical back: Normal range of motion.  Neurological:     General: No focal deficit present.     Mental Status: He is alert and oriented to person, place, and time.  Psychiatric:        Attention and Perception: Attention and perception normal.        Mood and Affect: Mood is anxious and depressed.        Speech: Speech normal.        Behavior:  Behavior normal. Behavior is cooperative.        Thought Content: Thought content normal.        Cognition and Memory: Cognition and memory normal.        Judgment: Judgment normal.     Review of Systems  Psychiatric/Behavioral:  Positive for depression. The patient is nervous/anxious.   All other systems reviewed and are negative.   Blood pressure 106/73, pulse 76, temperature (!) 97.5 F (36.4 C), temperature source Oral, resp. rate 20, height 5\' 7"  (1.702 m), weight 54.4 kg, SpO2 94 %.Body mass index is 18.79 kg/m.  General Appearance: Fairly Groomed  Eye Contact:  Good  Speech:  Clear and Coherent  Volume:  Normal  Mood:  Anxious and Depressed  Affect:  congruent  Thought Process:  Coherent and linear  Orientation:  Full (Time, Place, and Person)  Thought Content:  WDL  Suicidal Thoughts:  No  Homicidal Thoughts:  No  Memory:  Negative  Judgement:  Fair  Insight:  Fair  Psychomotor Activity:  decreased  Concentration:  Concentration: Good and Attention Span: Good  Recall:  Good  Fund of Knowledge:  Good  Language:  Good  Akathisia:  Negative  Handed:  Right  AIMS (if indicated):     Assets:  Communication Skills Desire for Improvement Housing Vocational/Educational  ADL's:  Intact  Cognition:  WNL  Sleep:        Physical Exam: Physical Exam Vitals and nursing note reviewed.  Constitutional:      Appearance: Normal appearance.  HENT:     Head: Normocephalic.     Nose: Nose normal.  Pulmonary:     Effort: Pulmonary effort is normal.  Musculoskeletal:        General: Normal range of motion.     Cervical back: Normal range of motion.  Neurological:     General: No focal deficit present.     Mental Status: He is alert and oriented to person, place, and time.  Psychiatric:        Attention and Perception: Attention and perception normal.        Mood and Affect: Mood is anxious and depressed.        Speech: Speech normal.        Behavior: Behavior normal.  Behavior is cooperative.        Thought Content: Thought content normal.        Cognition and Memory: Cognition and memory normal.        Judgment: Judgment normal.    Review of Systems  Psychiatric/Behavioral:  Positive for depression. The patient is nervous/anxious.   All other systems reviewed and are negative.  Blood pressure 106/73, pulse 76, temperature (!) 97.5 F (36.4 C), temperature source Oral, resp. rate 20, height 5\' 7"  (1.702 m), weight 54.4 kg, SpO2 94 %. Body mass index is 18.79 kg/m.  Treatment Plan Summary: Major depressive disorder, recurrent, moderate: Started Lexapro 5 mg daily Follow up with [facility], information placed  in discharge instructions  Disposition: No evidence of imminent risk to self or others at present.   Patient does not meet criteria for psychiatric inpatient admission. Supportive therapy provided about ongoing stressors.  Nanine Means, NP 01/04/2023 2:40 PM

## 2023-01-04 NOTE — Progress Notes (Addendum)
PROGRESS NOTE    Corey House.  FWY:[id number] DOB: [birth year] DOA: 01/03/2023 PCP: Patient, [name]  Outpatient Specialists: none    Brief Narrative:   From admission h and p  [facility]y House. is an 57 y.o. male brought to the [facility] for concern of suicidal ideation and cough. Patient in the emergency room denies any suicidal ideation or homicidal ideation.  Does report sadness and feeling depressed. ED MD has requested psych consult.  Patient is not committed with IVC. Patient was seen in an urgent care today and given breathing treatments. Patient still reports feeling short of breath and his symptoms have been going on for about 3 to 4 days. Patient was started on 2 L of oxygen as he was found to be hypoxic.  Patient does have a smoking history and does continue to smoke every day. Chart review shows imaging of the chest showing patient has a history of COPD.  Assessment & Plan:   Principal Problem:   Cough Active Problems:   Acute respiratory failure with hypoxia (HCC)   COPD with acute exacerbation (HCC)   Tobacco abuse  # Acute hypoxic respiratory failure Dyspneic and o2 in 80s in the ED now breathing comfortably on 5 L - cont [place] o2, wean as able  # Acute bronchitis # Possible COPD exacerbation # Tobacco abuse CTA neg for PE but findings consistent w/ bronchitis. Patient reports 3 weeks worsening symptoms. Covid/flu/rsv negative. Extensive smoking history. Nothing focal on CT. - continue azithromycin, stop azithromycin - sputum for culture - f/u blood cultures - continue steroids, duonebs - nicotine patch  # Panic attacks Patient reports intermittent panic attacks, feeling like he wants to die, denies SI (as reported on initial presentation to ED), not hearing voices or commands, no plan to hurt self - monitor  # History opioid use disorder Reports being clean for 15 years and on suboxone during that time. Reports takes "small  pieces" of 8 mg suboxone tablets several times a day - suboxone 2 tid for now - f/u hiv, hcv, rpr, hbv  # Hyperkalemia 5.3 on initial labs - repeat  # Muscle aches - f/u ck  # Elevated salicylate level Reports frequent use of bc powders, denies intentional or unintentional overdose - repeat lab   DVT prophylaxis: lovenox Code Status: full Family Communication: none @ bedside  Level of care: Telemetry Medical Status is: Observation     Consultants:  none  Procedures: none    Subjective: Some cough and sob improving  Objective: Vitals:   01/04/23 0100 01/04/23 0213 01/04/23 0339 01/04/23 0728  BP: 110/81 (!) 111/96 103/69 106/73  Pulse: 79 (!) 104 93 76  Resp: 14 16 14 20   Temp:  98.3 F (36.8 C) 98.3 F (36.8 C) (!) 97.5 F (36.4 C)  TempSrc:    Oral  SpO2: 93% 100% 97% 98%  Weight:      Height:        Intake/Output Summary (Last 24 hours) at 01/04/2023 1026 Last data filed at 01/04/2023 0230 Gross per 24 hour  Intake 120 ml  Output 200 ml  Net -80 ml   Filed Weights   01/03/23 2101  Weight: 54.4 kg    Examination:  General exam: Appears calm and comfortable  Respiratory system: Clear to auscultation. Respiratory effort normal. Cardiovascular system: S1 & S2 heard, RRR. No JVD, murmurs, rubs, gallops or clicks. No pedal edema. Gastrointestinal system: Abdomen is nondistended, soft and nontender. No  organomegaly or masses felt. Normal bowel sounds heard. Central nervous system: Alert and oriented. No focal neurological deficits. Extremities: Symmetric 5 x 5 power. Skin: No rashes, lesions or ulcers Psychiatry: Judgement and insight appear normal. Mood & affect appropriate.     Data Reviewed: I have personally reviewed following labs and imaging studies  CBC: Recent Labs  Lab 01/03/23 2105  WBC 12.6*  HGB 16.4  HCT 52.4*  MCV 93.9  PLT 578   Basic Metabolic Panel: Recent Labs  Lab 01/03/23 2105  NA 139  K 5.3*  CL 97*  CO2 32   GLUCOSE 110*  BUN 18  CREATININE 0.82  CALCIUM 9.2   GFR: Estimated Creatinine Clearance: 78.3 mL/min (by C-G formula based on SCr of 0.82 mg/dL). Liver Function Tests: No results for input(s): "AST", "ALT", "ALKPHOS", "BILITOT", "PROT", "ALBUMIN" in the last 168 hours. No results for input(s): "LIPASE", "AMYLASE" in the last 168 hours. No results for input(s): "AMMONIA" in the last 168 hours. Coagulation Profile: No results for input(s): "INR", "PROTIME" in the last 168 hours. Cardiac Enzymes: No results for input(s): "CKTOTAL", "CKMB", "CKMBINDEX", "TROPONINI" in the last 168 hours. BNP (last 3 results) No results for input(s): "PROBNP" in the last 8760 hours. HbA1C: No results for input(s): "HGBA1C" in the last 72 hours. CBG: No results for input(s): "GLUCAP" in the last 168 hours. Lipid Profile: No results for input(s): "CHOL", "HDL", "LDLCALC", "TRIG", "CHOLHDL", "LDLDIRECT" in the last 72 hours. Thyroid Function Tests: No results for input(s): "TSH", "T4TOTAL", "FREET4", "T3FREE", "THYROIDAB" in the last 72 hours. Anemia Panel: No results for input(s): "VITAMINB12", "FOLATE", "FERRITIN", "TIBC", "IRON", "RETICCTPCT" in the last 72 hours. Urine analysis:    Component Value Date/Time   COLORURINE YELLOW (A) 01/03/2023 2105   APPEARANCEUR CLEAR (A) 01/03/2023 2105   LABSPEC 1.025 01/03/2023 2105   PHURINE 5.0 01/03/2023 2105   GLUCOSEU NEGATIVE 01/03/2023 2105   HGBUR NEGATIVE 01/03/2023 2105   BILIRUBINUR NEGATIVE 01/03/2023 2105   KETONESUR 20 (A) 01/03/2023 2105   PROTEINUR 30 (A) 01/03/2023 2105   NITRITE NEGATIVE 01/03/2023 2105   LEUKOCYTESUR NEGATIVE 01/03/2023 2105   Sepsis Labs: @LABRCNTIP (procalcitonin:4,lacticidven:4)  ) Recent Results (from the past 240 hour(s))  Resp panel by RT-PCR (RSV, Flu A&B, Covid) Anterior Nasal Swab     Status: None   Collection Time: 01/03/23  9:05 PM   Specimen: Anterior Nasal Swab  Result Value Ref Range Status   SARS  Coronavirus 2 by RT PCR NEGATIVE NEGATIVE Final    Comment: (NOTE) SARS-CoV-2 target nucleic acids are NOT DETECTED.  The SARS-CoV-2 RNA is generally detectable in upper respiratory specimens during the acute phase of infection. The lowest concentration of SARS-CoV-2 viral copies this assay can detect is 138 copies/mL. A negative result does not preclude SARS-Cov-2 infection and should not be used as the sole basis for treatment or other patient management decisions. A negative result may occur with  improper specimen collection/handling, submission of specimen other than nasopharyngeal swab, presence of viral mutation(s) within the areas targeted by this assay, and inadequate number of viral copies(<138 copies/mL). A negative result must be combined with clinical observations, patient history, and epidemiological information. The expected result is Negative.  Fact Sheet for Patients:  EntrepreneurPulse.com.au  Fact Sheet for Healthcare Providers:  IncredibleEmployment.be  This test is no t yet approved or cleared by the [place] FDA and  has been authorized for detection and/or diagnosis of SARS-CoV-2 by FDA under an Emergency Use Authorization (EUA). This EUA will  remain  in effect (meaning this test can be used) for the duration of the COVID-19 declaration under Section 564(b)(1) of the Act, 21 U.S.C.section 360bbb-3(b)(1), unless the authorization is terminated  or revoked sooner.       Influenza A by PCR NEGATIVE NEGATIVE Final   Influenza B by PCR NEGATIVE NEGATIVE Final    Comment: (NOTE) The Xpert Xpress SARS-CoV-2/FLU/RSV plus assay is intended as an aid in the diagnosis of influenza from Nasopharyngeal swab specimens and should not be used as a sole basis for treatment. Nasal washings and aspirates are unacceptable for Xpert Xpress SARS-CoV-2/FLU/RSV testing.  Fact Sheet for  Patients: BloggerCourse.com  Fact Sheet for Healthcare Providers: SeriousBroker.it  This test is not yet approved or cleared by the [place] FDA and has been authorized for detection and/or diagnosis of SARS-CoV-2 by FDA under an Emergency Use Authorization (EUA). This EUA will remain in effect (meaning this test can be used) for the duration of the COVID-19 declaration under Section 564(b)(1) of the Act, 21 U.S.C. section 360bbb-3(b)(1), unless the authorization is terminated or revoked.     Resp Syncytial Virus by PCR NEGATIVE NEGATIVE Final    Comment: (NOTE) Fact Sheet for Patients: BloggerCourse.com  Fact Sheet for Healthcare Providers: SeriousBroker.it  This test is not yet approved or cleared by the [place] FDA and has been authorized for detection and/or diagnosis of SARS-CoV-2 by FDA under an Emergency Use Authorization (EUA). This EUA will remain in effect (meaning this test can be used) for the duration of the COVID-19 declaration under Section 564(b)(1) of the Act, 21 U.S.C. section 360bbb-3(b)(1), unless the authorization is terminated or revoked.  Performed at [facility], [address] [place], [place] [address]   Blood culture (routine x 2)     Status: None (Preliminary result)   Collection Time: 01/04/23 12:50 AM   Specimen: BLOOD  Result Value Ref Range Status   Specimen Description BLOOD RIGTH FA  Final   Special Requests   Final    BOTTLES DRAWN AEROBIC AND ANAEROBIC Blood Culture adequate volume   Culture   Final    NO GROWTH < 12 HOURS Performed at [facility], [address], [place], [place] [address]    Report Status PENDING  Incomplete  Blood culture (routine x 2)     Status: None (Preliminary result)   Collection Time: 01/04/23 12:50 AM   Specimen: BLOOD  Result Value Ref Range Status   Specimen Description  BLOOD RIGHT [facility]  Final   Special Requests   Final    BOTTLES DRAWN AEROBIC AND ANAEROBIC Blood Culture adequate volume   Culture   Final    NO GROWTH < 12 HOURS Performed at [facility], [address] [place], [place] [address]    Report Status PENDING  Incomplete         Radiology Studies: CT Angio Chest Pulmonary Embolism (PE) W or WO Contrast  Result Date: 01/04/2023 CLINICAL DATA:  Cough and difficulty breathing, initial encounter EXAM: CT ANGIOGRAPHY CHEST WITH CONTRAST TECHNIQUE: Multidetector CT imaging of the chest was performed using the standard protocol during bolus administration of intravenous contrast. Multiplanar CT image reconstructions and MIPs were obtained to evaluate the vascular anatomy. RADIATION DOSE REDUCTION: This exam was performed according to the departmental dose-optimization program which includes automated exposure control, adjustment of the mA and/or kV according to patient size and/or use of iterative reconstruction technique. CONTRAST:  57mL OMNIPAQUE IOHEXOL 350 MG/ML SOLN COMPARISON:  Chest  x-ray from the previous day, CT from 03/29/2011. FINDINGS: Cardiovascular: Mild atherosclerotic calcifications of the thoracic aorta are noted. No aneurysmal dilatation or dissection is seen.Pulmonary artery shows a normal branching pattern bilaterally. No filling defect to suggest pulmonary embolism is noted. No coronary calcifications are seen. Mediastinum/Nodes: Thoracic inlet is within normal limits. Scattered mediastinal lymph nodes are noted and stable from the prior exam. The esophagus is within normal limits. Bilateral hilar lymph nodes are seen more prominent than that noted on the prior exam likely of a reactive nature. Lungs/Pleura: Lungs are well aerated bilaterally. Diffuse emphysematous changes are seen. Bronchial thickening is noted particularly in the lower lobes bilaterally consistent with bronchitis. No focal infiltrate is seen. No sizable  parenchymal nodule is noted. Upper Abdomen: Visualized upper abdomen is within normal limits. Musculoskeletal: Degenerative changes of the thoracic spine are noted. No acute rib abnormality is seen. Review of the MIP images confirms the above findings. IMPRESSION: Changes consistent with bronchitis in the lower lobes primarily with associated reactive hilar lymph nodes. No evidence of pulmonary emboli. No other focal abnormality is noted. Aortic Atherosclerosis (ICD10-I70.0) and Emphysema (ICD10-J43.9). Electronically Signed   By: Alcide Clever M.D.   On: 01/04/2023 01:52   DG Chest 2 View  Result Date: 01/03/2023 CLINICAL DATA:  Cough, hypoxia EXAM: CHEST - 2 VIEW COMPARISON:  04/16/2020 FINDINGS: Frontal and lateral views of the chest demonstrate an unremarkable cardiac silhouette. Lungs are hyperinflated with background emphysema. There is increased bilateral bronchovascular prominence in a perihilar distribution, which may reflect bronchitis or viral pneumonitis. No lobar consolidation, effusion, or pneumothorax. No acute bony abnormalities. IMPRESSION: 1. Bilateral perihilar bronchovascular prominence consistent with bronchitis or viral pneumonitis. No lobar consolidation. 2. Emphysema. Electronically Signed   By: Sharlet Salina M.D.   On: 01/03/2023 21:29        Scheduled Meds:  docusate sodium  100 mg Oral BID   enoxaparin (LOVENOX) injection  40 mg Subcutaneous Q24H   feeding supplement  237 mL Oral TID BM   methylPREDNISolone (SOLU-MEDROL) injection  60 mg Intravenous Q12H   Followed by   Melene Muller ON 01/05/2023] predniSONE  40 mg Oral Q breakfast   multivitamin with minerals  1 tablet Oral Daily   nicotine  14 mg Transdermal Daily   sodium chloride flush  3 mL Intravenous Q12H   Continuous Infusions:  azithromycin 500 mg (01/04/23 0342)   cefTRIAXone (ROCEPHIN)  IV Stopped (01/04/23 0129)     LOS: 0 days     Silvano Bilis, MD [facility]   If 7PM-7AM, please contact  night-coverage [email or web address] Password [facility] 01/04/2023, 10:26 AM

## 2023-01-04 NOTE — Progress Notes (Signed)
Initial Nutrition Assessment  DOCUMENTATION CODES:   Severe malnutrition in context of chronic illness  INTERVENTION:   -MVI with minerals daily -Ensure Enlive po TID, each supplement provides 350 kcal and 20 grams of protein -Liberalize diet to regular for widest variety of meal selections  NUTRITION DIAGNOSIS:   Severe Malnutrition related to chronic illness (COPD) as evidenced by moderate fat depletion, severe fat depletion, moderate muscle depletion, severe muscle depletion.  GOAL:   Patient will meet greater than or equal to 90% of their needs  MONITOR:   PO intake, Supplement acceptance  REASON FOR ASSESSMENT:   Consult Assessment of nutrition requirement/status  ASSESSMENT:   Pt admitted with cough and concerns for SI.  Pt admitted with COPD exacerbation.   Reviewed I/O's: -80 ml x 24 hours  UOP: 200 ml x 24 hours  Spoke with pt at bedside, who reports feeling a little better today, but continues to complain about pain in his legs. Pt shares that he feels very weak, "like I have no energy". Per pt, he has not eating anything over the past 2-3 days PTA, but is looking forward to eating breakfast. Pt shares that he does not feel hungry often and generally consumes 1 meal per day, but gets full quickly (meals consist of a half of a pizza or a half of a cheeseburger).    Pt shares that he lives in a boarding house "with a bunch of old people" and does not feel like he has a good support system of people he is able to confide in. Pt has a younger sister and children who live locally, but their relationship is strained. Pt also has a lot of anxiety being in the hospital, stating that both his brother and his mother have passed away in this hospital over the past 5 years. Pt also reports his dad had lung disease related to smoking cigarettes, who passed away 10-12 years ago. Pt shares with this RD that he knows he should stop smoking cigarettes and voices regret of ever  starting to smoke. RD provided reflective listening and emotional support.   Per pt, his UBW is around 160-170#. Pt shares he has "lost a lot of weight" over the past 5 years, due to dealing with a lot of grief and loss. Over the past few months, he reports his weight has stabilized between 120-125#. Reviewed wt hx; no wt loss noted over the past 1.5 years.   Given above concerns, RD offered spiritual care consult. Pt shares that he spends a lot of time alone and has "thoughts that are always racing through my head- some bad some good". Compassionate presence provided and explained that spiritual care consult could benefit pt as an additional layer of support. Pt reports he would like to think about it, but hesitant to pursue at this time.   Discussed importance of good meal and supplement intake to promote healing. Pt amenable to supplements.   Medications reviewed and include colace and solu-medrol.   Labs reviewed.   NUTRITION - FOCUSED PHYSICAL EXAM:  Flowsheet Row Most Recent Value  Orbital Region Moderate depletion  Upper Arm Region Severe depletion  Thoracic and Lumbar Region Severe depletion  Buccal Region Moderate depletion  Temple Region Severe depletion  Clavicle Bone Region Severe depletion  Clavicle and Acromion Bone Region Severe depletion  Scapular Bone Region Severe depletion  Dorsal Hand Severe depletion  Patellar Region Moderate depletion  Anterior Thigh Region Moderate depletion  Posterior Calf Region Moderate depletion  Edema (RD Assessment) None  Hair Reviewed  Eyes Reviewed  Mouth Reviewed  Skin Reviewed  Nails Reviewed       Diet Order:   Diet Order             Diet regular Fluid consistency: Thin  Diet effective now                   EDUCATION NEEDS:   Education needs have been addressed  Skin:  Skin Assessment: Skin Integrity Issues: Skin Integrity Issues:: Stage I Stage I: coccyx  Last BM:  Unknown  Height:   Ht Readings from  Last 1 Encounters:  01/03/23 5\' 7"  (1.702 m)    Weight:   Wt Readings from Last 1 Encounters:  01/03/23 54.4 kg    Ideal Body Weight:  67.3 kg  BMI:  Body mass index is 18.79 kg/m.  Estimated Nutritional Needs:   Kcal:  1950-2150  Protein:  90-105 grams  Fluid:  > 1.9 L    Loistine [facility], RD, LDN, [place] Registered Dietitian II Certified Diabetes Care and Education Specialist Please refer to [facility] for RD and/or RD on-call/weekend/after hours pager

## 2023-01-04 NOTE — Evaluation (Signed)
Physical Therapy Evaluation Patient Details Name: Corey House. MRN: [id number] DOB: 1966-03-17 Today's Date: 01/04/2023  History of Present Illness  Corey House is a 58yoM who comes to [facility] on 01/03/23 via [facility] c suicidal ideation/cough. Pt remains SOB despite breathing treatments at urgent care previously. In ED pt hypoxic requiring supplemental O2 up to 5L. Chest CT negative for PE.  Clinical Impression  Pt admitted c above Dx. Pt shows functional limitations due to the deficits listed below (see "PT Problem List"). Patient agreeable to PT evaluation. PLOF and home setup obtained. Pt able to perform bed mobility, transfers, and AMB with greater than typical effort, no physical assist needed. His anxiety makes attending to lines/leads difficult when he gets to coughing. Pt concerned about insidious imbalance issues over the past year, no clear etiology identified in session, no gross LOB noted, reports ataxia upon first rising at home and walking like he's drunk. Also reports excessive daytime sleepiness even when he has had 8-9 hours of sleep time.Pt maintained on 6L/min for AMB in hallway. Patient's assessment reveals acute need for additional person for safety and/or physical assistance to complete their typical ADL. At baseline, the patient is able to perform ADL with modified independence. Patient will benefit from skilled PT intervention to maximize independence and safety in mobility required for basic ADL performance at discharge.        Recommendations for follow up therapy are one component of a multi-disciplinary discharge planning process, led by the attending physician.  Recommendations may be updated based on patient status, additional functional criteria and insurance authorization.  Follow Up Recommendations Outpatient PT      Assistance Recommended at Discharge Set up Supervision/Assistance  Patient can return home with the following  Two people to help with  bathing/dressing/bathroom    Equipment Recommendations Other (comment) (shower seat for bathing at boarding house)  Recommendations for Other Services       Functional Status Assessment Patient has had a recent decline in their functional status and demonstrates the ability to make significant improvements in function in a reasonable and predictable amount of time.     Precautions / Restrictions Precautions Precautions: Fall Restrictions Weight Bearing Restrictions: No      Mobility  Bed Mobility Overal bed mobility: Independent                  Transfers Overall transfer level: Independent                      Ambulation/Gait Ambulation/Gait assistance: Modified independent (Device/Increase time) Gait Distance (Feet): 290 Feet Assistive device: None Gait Pattern/deviations: WFL(Within Functional Limits)       General Gait Details: Pt maintained on 6L/min (5L in room)  Stairs            Wheelchair Mobility    Modified Rankin (Stroke Patients Only)       Balance Overall balance assessment: History of Falls, Modified Independent (screened ankle sensation/MMT, eyes closed, marching in place, vertical/horizontal head turns: no LOB)                                           Pertinent Vitals/Pain Pain Assessment Pain Assessment: 0-10 Pain Score: 6  Pain Location: bilat genreallized leg achiness Pain Descriptors / Indicators: Aching Pain Intervention(s): Limited activity within patient's tolerance, Monitored during session, Premedicated before session  Home Living Family/patient expects to be discharged to:: Other (Comment) (boarding house) Living Arrangements: Alone   Type of Home:  (boarding house) Home Access: Stairs to enter   Entrance Stairs-Number of Steps: 1-2 flights   Home Layout: Two level Home Equipment: None      Prior Function Prior Level of Function : Working/employed;History of Falls (last six  months);Driving             Mobility Comments: feels like his balance has been insidiously bad for past year, has had a few falls, does not feel secure on stairs or in shower. 2 falls this year       Hand Dominance        Extremity/Trunk Assessment   Upper Extremity Assessment Upper Extremity Assessment: Overall WFL for tasks assessed    Lower Extremity Assessment Lower Extremity Assessment: Overall WFL for tasks assessed       Communication      Cognition Arousal/Alertness: Awake/alert Behavior During Therapy: WFL for tasks assessed/performed, Anxious, Impulsive Overall Cognitive Status: Within Functional Limits for tasks assessed                                          General Comments      Exercises     Assessment/Plan    PT Assessment Patient needs continued PT services  PT Problem List Decreased strength;Decreased range of motion;Decreased activity tolerance;Decreased balance;Decreased mobility;Decreased coordination;Decreased cognition       PT Treatment Interventions Neuromuscular re-education;Gait training;Stair training;Functional mobility training;Therapeutic activities;Therapeutic exercise;Balance training;Patient/family education    PT Goals (Current goals can be found in the Care Plan section)  Acute Rehab PT Goals Patient Stated Goal: improve balance issues PT Goal Formulation: With patient Time For Goal Achievement: 01/18/23 Potential to Achieve Goals: Good    Frequency Min 2X/week     Co-evaluation               AM-PAC PT "6 Clicks" Mobility  Outcome Measure Help needed turning from your back to your side while in a flat bed without using bedrails?: None Help needed moving from lying on your back to sitting on the side of a flat bed without using bedrails?: None Help needed moving to and from a bed to a chair (including a wheelchair)?: A Little Help needed standing up from a chair using your arms (e.g.,  wheelchair or bedside chair)?: A Little Help needed to walk in hospital room?: A Little Help needed climbing 3-5 steps with a railing? : A Little 6 Click Score: 20    End of Session Equipment Utilized During Treatment: Oxygen Activity Tolerance: Patient tolerated treatment well Patient left: in chair;with call bell/phone within reach Nurse Communication: Mobility status PT Visit Diagnosis: Difficulty in walking, not elsewhere classified (R26.2);History of falling (Z91.81)    Time: 1700-1749 PT Time Calculation (min) (ACUTE ONLY): 32 min   Charges:   PT Evaluation $PT Eval Moderate Complexity: 1 Mod PT Treatments $Neuromuscular Re-education: 8-22 mins       4:25 PM, 01/04/23 Etta Grandchild, PT, DPT Physical Therapist - [facility]  [phone] ([place])    Cybill Uriegas C 01/04/2023, 4:25 PM

## 2023-01-04 NOTE — Sepsis Progress Note (Signed)
Notified bedside nurse of need to draw blood cultures & lactic acid .

## 2023-01-04 NOTE — Discharge Instructions (Addendum)
[facility] in [place], [place]  Address: [address] [place], [place] [address] Hours: 5?PM ? Opens 8?AM Mon Phone: [phone]     Food Resources  Agency Name: [facility] Agency Address: [address], [place], [place] [address] Phone: [phone] Website: [email or web address]  Service(s) Offered: Housing services, self-sufficiency, congregate meal  program, weatherization program, Administrator, sports program, emergency food assistance,  housing counseling, home ownership program, wheels -towork program. Meals free for 60 and older at various  locations from 9am-1pm, Monday-Friday:  [employer], [place], [place], [address], [place]   [facility], [place] [phone] The [address], [address]   [place], [place]  Agency Name: [facility] Address: [place] [address], [place], [place], [place] [address] Phone: [phone] Website: [email or web address] Service(s) Offered: Home delivered hot, frozen, and emergency  meals. Grocery assistance program which matches  volunteers one-on-one with seniors unable to grocery shop  for themselves. Must be 60 years and older; less than 20  hours of in-home aide service, limited or no driving ability;  live alone or with someone with a disability; live in  [place].  Agency Name: [employer] ([place]) Address: [address] [place], [place] [address] Phone: [phone] Service(s) Offered: Food is served to [place], homeless, elderly, and low  income people in the community every Saturday (11:30  am-12:30 pm) and Sunday (12:30 pm-1:30pm). Volunteers  also offer help and encouragement in seeking employment,  and spiritual guidance. April 11, 2017 8  Agency Name: Department of  Social Services Address: [address] [place], [place] [address] Phone: [phone] Service(s) Offered: Child support services; child welfare services; food stamps;  Medicaid; work first family assistance; and aid with fuel,  rent, food and medicine.  Agency Name: Chemical engineer Address: [address] [place], [place] Phone: [phone] Website: [email or web address] Services Offered: Monday 10:00am-12:00, 8:00pm-9:00pm, and Friday  10:00am-12:00. Agency Name: [facility] [employer] [facility] [place] Address: [address] [place], [place] [address] Phone: [phone] Website: [email or web address] Service(s) Offered: Serves weekday meals, open from 11:30 am- 1:00 pm., and  6:30-7:30pm, Monday-Wednesday-Friday distributes food  3:30-6pm, Monday-Wednesday-Friday.  Agency Name: [facility] Address: [address] [place], [place] Phone: [phone] Website: [email or web address] Services Offered: Distributes food the 4th Saturday of the month, starting at  8:00 am Agency Name: [facility] Address: [id number] [address], [place], [place] [address] Phone: [phone] Website: [email or web address] Service(s) Offered: Bread of life, weekly food pantry. Open Wednesdays from  10:00am-noon.  Agency Name: [facility] Address: [address], [place], [place] Phone: [phone] Services Offered: Distributes food 9am-1pm, Monday-Thursday. Call for details.   Agency Name: [facility] Address: [address] [place], [place] [address] Phone: [phone] Website: [email or web address] Service(s) Offered: Youth worker. Call for assistance. Agency Name: [facility] Address: [address] [place], [place] [address] Phone: [phone] Service Offered: Emergency Food Pantry. Call for appointment.  Agency Name: [facility] Address: [address], [place], [place] [address] Phone: [phone] Website: [email or web address] Services Offered: Youth worker. Call for details Agency Name: [facility] Address: [place], [place] Phone: [phone] Website: [email or web address] Service(s) Offered: Emergency Food Pantry. Call for details.  Agency Name: [employer] Address: [place] [place], [place] [address] Phone: [phone] or  [phone] Website: [email or web address] Service(s) Offered: Distribute food 9am-11:30 am, Tuesday-Friday, and 1- 3:30pm, Monday-Friday. Food pantry Monday-Friday  1pm-3pm, fresh items, Mon.-Wed.-Fri.  Agency Name: [facility] Address: [address] [place], [place] [address] Phone: [phone] Website: www.[email or web address] Services Offered: Distribute food Tues and Sats from 9:00am-noon. Closed  1st Saturday of each month. Call for details

## 2023-01-04 NOTE — Progress Notes (Signed)
CODE SEPSIS - PHARMACY COMMUNICATION  **Broad Spectrum Antibiotics should be administered within 1 hour of Sepsis diagnosis**  Time Code Sepsis Called/Page Received: 1/18 @ 2256  Antibiotics Ordered: Ceftriaxone , Azithromycin  Time of 1st antibiotic administration: Ceftriaxone 2 gm IV X 1  Additional action taken by pharmacy:   If necessary, Name of Provider/Nurse Contacted:     Tammela Bales D ,PharmD Clinical Pharmacist  01/04/2023  1:27 AM

## 2023-01-05 DIAGNOSIS — J441 Chronic obstructive pulmonary disease with (acute) exacerbation: Secondary | ICD-10-CM

## 2023-01-05 LAB — BASIC METABOLIC PANEL
Anion gap: 7 (ref 5–15)
BUN: 18 mg/dL (ref 6–20)
CO2: 32 mmol/L (ref 22–32)
Calcium: 8.2 mg/dL — ABNORMAL LOW (ref 8.9–10.3)
Chloride: 99 mmol/L (ref 98–111)
Creatinine, Ser: 0.58 mg/dL — ABNORMAL LOW (ref 0.61–1.24)
GFR, Estimated: >60 mL/min (ref >60)
Glucose, Bld: 138 mg/dL — ABNORMAL HIGH (ref 70–99)
Potassium: 4.6 mmol/L (ref 3.5–5.1)
Sodium: 138 mmol/L (ref 135–145)

## 2023-01-05 LAB — RPR: RPR Ser Ql: NONREACTIVE

## 2023-01-05 LAB — GLUCOSE, CAPILLARY: Glucose-Capillary: 131 mg/dL — ABNORMAL HIGH (ref 70–99)

## 2023-01-05 NOTE — Progress Notes (Signed)
PROGRESS NOTE    Corey House.  GGY:[id number]  DOB: 07/14/1966  DOA: 01/03/2023 PCP: Patient, [name] Outpatient Specialists:   Hospital course:  57 year old male was brought to the [facility] for concern for suicidal ideation and was transferred to the ED.  In the ED patient denied any intent to hurt himself or anybody else but he was noted to be somewhat hypoxic.  Patient was admitted for COPD flare   Subjective:  Patient states he does feel better.  Notes that his breathing is better, he is less weak and less tired.  Was able to take a shower today with only mild shortness of breath.  He is not at baseline however   Objective: Vitals:   01/04/23 2109 01/05/23 0424 01/05/23 0738 01/05/23 0806  BP:  102/72  103/67  Pulse:  (!) 59  65  Resp:  18  16  Temp:  98.7 F (37.1 C)  98.2 F (36.8 C)  TempSrc:  Oral  Oral  SpO2: 93% 91% 91% 90%  Weight:      Height:        Intake/Output Summary (Last 24 hours) at 01/05/2023 1241 Last data filed at 01/05/2023 0600 Gross per 24 hour  Intake 358 ml  Output 550 ml  Net -192 ml   Filed Weights   01/03/23 2101  Weight: 54.4 kg     Exam:  General: Patient lying in bed in NAD Eyes: sclera anicteric, conjuctiva mild injection bilaterally CVS: S1-S2, regular  Respiratory:  decreased air entry bilaterally secondary to decreased inspiratory effort, rales at bases  GI: NABS, soft, NT  LE: Warm and well-perfused Neuro: A/O x 3,  grossly nonfocal.  Psych: patient is logical and coherent, judgement and insight appear normal, mood and affect appropriate to situation.  Data Reviewed:  Basic Metabolic Panel: Recent Labs  Lab 01/03/23 2105 01/04/23 1053 01/05/23 0405  NA 139 137 138  K 5.3* 4.0 4.6  CL 97* 97* 99  CO2 32 29 32  GLUCOSE 110* 237* 138*  BUN 18 17 18   CREATININE 0.82 0.71 0.58*  CALCIUM 9.2 8.4* 8.2*    CBC: Recent Labs  Lab 01/03/23 2105  WBC 12.6*  HGB 16.4  HCT 52.4*   MCV 93.9  PLT 381     Scheduled Meds:  azithromycin  250 mg Oral Daily   buprenorphine-naloxone  1 tablet Sublingual TID   docusate sodium  100 mg Oral BID   enoxaparin (LOVENOX) injection  40 mg Subcutaneous Q24H   escitalopram  5 mg Oral Daily   feeding supplement  237 mL Oral TID BM   ipratropium-albuterol  3 mL Nebulization TID   multivitamin with minerals  1 tablet Oral Daily   nicotine  14 mg Transdermal Daily   predniSONE  40 mg Oral Q breakfast   sodium chloride flush  3 mL Intravenous Q12H   Continuous Infusions:   Assessment & Plan:   COPD flare Patient improving on present management with steroids and inhaled bronchodilators CTA was negative for PE Continue azithromycin  Depression Seen by psychiatry No need for IVC at present Appreciate consultation and management Continue citalopram  Opioid use disorder Continue Suboxone     Studies: CT Angio Chest Pulmonary Embolism (PE) W or WO Contrast  Result Date: 01/04/2023 CLINICAL DATA:  Cough and difficulty breathing, initial encounter EXAM: CT ANGIOGRAPHY CHEST WITH CONTRAST TECHNIQUE: Multidetector CT imaging of the chest was performed using the standard protocol during bolus administration  of intravenous contrast. Multiplanar CT image reconstructions and MIPs were obtained to evaluate the vascular anatomy. RADIATION DOSE REDUCTION: This exam was performed according to the departmental dose-optimization program which includes automated exposure control, adjustment of the mA and/or kV according to patient size and/or use of iterative reconstruction technique. CONTRAST:  59mL OMNIPAQUE IOHEXOL 350 MG/ML SOLN COMPARISON:  Chest x-ray from the previous day, CT from 03/29/2011. FINDINGS: Cardiovascular: Mild atherosclerotic calcifications of the thoracic aorta are noted. No aneurysmal dilatation or dissection is seen.Pulmonary artery shows a normal branching pattern bilaterally. No filling defect to suggest pulmonary  embolism is noted. No coronary calcifications are seen. Mediastinum/Nodes: Thoracic inlet is within normal limits. Scattered mediastinal lymph nodes are noted and stable from the prior exam. The esophagus is within normal limits. Bilateral hilar lymph nodes are seen more prominent than that noted on the prior exam likely of a reactive nature. Lungs/Pleura: Lungs are well aerated bilaterally. Diffuse emphysematous changes are seen. Bronchial thickening is noted particularly in the lower lobes bilaterally consistent with bronchitis. No focal infiltrate is seen. No sizable parenchymal nodule is noted. Upper Abdomen: Visualized upper abdomen is within normal limits. Musculoskeletal: Degenerative changes of the thoracic spine are noted. No acute rib abnormality is seen. Review of the MIP images confirms the above findings. IMPRESSION: Changes consistent with bronchitis in the lower lobes primarily with associated reactive hilar lymph nodes. No evidence of pulmonary emboli. No other focal abnormality is noted. Aortic Atherosclerosis (ICD10-I70.0) and Emphysema (ICD10-J43.9). Electronically Signed   By: Inez Catalina M.D.   On: 01/04/2023 01:52   DG Chest 2 View  Result Date: 01/03/2023 CLINICAL DATA:  Cough, hypoxia EXAM: CHEST - 2 VIEW COMPARISON:  04/16/2020 FINDINGS: Frontal and lateral views of the chest demonstrate an unremarkable cardiac silhouette. Lungs are hyperinflated with background emphysema. There is increased bilateral bronchovascular prominence in a perihilar distribution, which may reflect bronchitis or viral pneumonitis. No lobar consolidation, effusion, or pneumothorax. No acute bony abnormalities. IMPRESSION: 1. Bilateral perihilar bronchovascular prominence consistent with bronchitis or viral pneumonitis. No lobar consolidation. 2. Emphysema. Electronically Signed   By: Randa Ngo M.D.   On: 01/03/2023 21:29    Principal Problem:   Cough Active Problems:   Acute respiratory failure with  hypoxia (HCC)   COPD with acute exacerbation (HCC)   Tobacco abuse   Major depressive disorder, recurrent episode, moderate ([place])   Pressure injury of skin   Protein-calorie malnutrition, severe     Tadao Emig Tublu Dorsie Sethi, [facility]  If 7PM-7AM, please contact night-coverage [email or web address]   LOS: 1 day

## 2023-01-05 NOTE — Consult Note (Signed)
[facility] Consult   Reason for Consult:  suicidal ideations Referring Physician:  Dr Si Raider Patient Identification: Corey House. MRN:  [id number] Principal Diagnosis: Cough Diagnosis:  Principal Problem:   Cough Active Problems:   COPD with acute exacerbation (HCC)   Tobacco abuse   Acute respiratory failure with hypoxia (HCC)   Major depressive disorder, recurrent episode, moderate (HCC)   Pressure injury of skin   Protein-calorie malnutrition, severe   Subjective:  Corey Nou. is a 57 y.o. male patient admitted with cough. Psych team consult was requested for suicidal ideations.  Total Time spent with patient: 25 minutes  Today 01/05/23: On evaluation, patient was seen laying in bed watching TV. Pt reported "feeling better" overall today. He reported that his physical symptoms have improved, including his breathing (less wheezing), weakness and sleep. He reported that he's ready to be discharged and denied SI, HI and AVH. He reported that he has received his discharge equipment including a shower chair, pulse ox and thermometer and requested a supply of Lexapro upon discharge until follow up with PCP for further management, psych cleared.  Total Time spent with patient: 45 minutes  HPI on 01/04/23: On evaluation, patient was primarily focused on his medical concerns including bronchitis, weakness, and alternating heat and cold intolerance. On evaluation, he was anxious appearing, sitting up in bed, and watching TV. He expressed not understanding why he has had bronchitis three times in the past year and reported feeling frustrated about his living situation and lack of support system. Patient stated his brother and mother died within the past year, and his mood over the last few months has worsened as a result. He also reported his main problem is feeling anxious and constantly worried, stating, "I get scared." Denied currently suicidal ideations, homicidal  ideations, and hallucinations. He denied using alcohol and other illicit drugs but reported smoking one pack per day until a couple days before admission, when his use was restricted by his respiratory status.   Patient reported he has taken Suboxone for 15 years, but a PDMP search revealed he has not picked up his prescription since August 2023. He also reported discontinuing Paxil 4 months ago; he had taken Paxil for years, then discontinued it, and found it was not effective when he restarted it. Patient reported taking Prozac previously, but developed suicidal ideations and it was discontinued. He has also tried other unknown antidepressants in the past, which were also ineffective. Patient reported he saw a therapist a few years ago and would like to try therapy again. He also expressed a desire to quit smoking. Lexapro started with his permission with agreed follow up at [facility].  Past Psychiatric History: Anxiety and depression, as above   Risk to Self: None  Risk to Others: None Prior Inpatient Therapy: None  Prior Outpatient Therapy: Described above   Past Medical History:  Past Medical History:  Diagnosis Date   Chronic cough    Urolithiasis    Urolithiasis     Past Surgical History:  Procedure Laterality Date   CATARACT EXTRACTION W/PHACO Left 08/09/2021   Procedure: CATARACT EXTRACTION PHACO AND INTRAOCULAR LENS PLACEMENT (IOC) LEFT .051 00:10.0;  Surgeon: Leandrew Koyanagi, MD;  Location: [facility];  Service: Ophthalmology;  Laterality: Left;   CATARACT EXTRACTION W/PHACO Right 10/04/2021   Procedure: CATARACT EXTRACTION PHACO AND INTRAOCULAR LENS PLACEMENT (IOC) RIGHT 0.67 00:13.8;  Surgeon: Leandrew Koyanagi, MD;  Location: [facility];  Service: Ophthalmology;  Laterality: Right;  general anesthesia needs to be last case   HERNIA REPAIR     x2   MANDIBLE FRACTURE SURGERY     Family History: History reviewed. No pertinent family history. Family  Psychiatric  History: unknown Social History:  Social History   Substance and Sexual Activity  Alcohol Use No     Social History   Substance and Sexual Activity  Drug Use Not on file    Social History   Socioeconomic History   Marital status: Single    Spouse name: Not on file   Number of children: Not on file   Years of education: Not on file   Highest education level: Not on file  Occupational History   Not on file  Tobacco Use   Smoking status: Every Day    Packs/day: 1.00    Years: 35.00    Total pack years: 35.00    Types: Cigarettes   Smokeless tobacco: Never   Tobacco comments:    Started smoking age 43  Vaping Use   Vaping Use: Never used  Substance and Sexual Activity   Alcohol use: No   Drug use: Not on file   Sexual activity: Not on file  Other Topics Concern   Not on file  Social History Narrative   Not on file   Social Determinants of Health   Financial Resource Strain: Not on file  Food Insecurity: Food Insecurity Present (01/04/2023)   Hunger Vital Sign    Worried About Running Out of Food in the Last Year: Sometimes true    Ran Out of Food in the Last Year: Sometimes true  Transportation Needs: No Transportation Needs (01/04/2023)   PRAPARE - Hydrologist (Medical): No    Lack of Transportation (Non-Medical): No  Physical Activity: Not on file  Stress: Not on file  Social Connections: Not on file   Additional Social History:    Allergies:  No Known Allergies  Labs:  Results for orders placed or performed during the hospital encounter of 01/03/23 (from the past 48 hour(s))  Basic metabolic panel     Status: Abnormal   Collection Time: 01/03/23  9:05 PM  Result Value Ref Range   Sodium 139 135 - 145 mmol/L   Potassium 5.3 (H) 3.5 - 5.1 mmol/L   Chloride 97 (L) 98 - 111 mmol/L   CO2 32 22 - 32 mmol/L   Glucose, Bld 110 (H) 70 - 99 mg/dL    Comment: Glucose reference range applies only to samples taken after  fasting for at least 8 hours.   BUN 18 6 - 20 mg/dL   Creatinine, Ser 0.82 0.61 - 1.24 mg/dL   Calcium 9.2 8.9 - 10.3 mg/dL   GFR, Estimated >60 >60 mL/min    Comment: (NOTE) Calculated using the CKD-EPI Creatinine Equation (2021)    Anion gap 10 5 - 15    Comment: Performed at [facility], [address] [place], [place] [address]  CBC     Status: Abnormal   Collection Time: 01/03/23  9:05 PM  Result Value Ref Range   WBC 12.6 (H) 4.0 - 10.5 K/uL   RBC 5.58 4.22 - 5.81 MIL/uL   Hemoglobin 16.4 13.0 - 17.0 g/dL   HCT 52.4 (H) 39.0 - 52.0 %   MCV 93.9 80.0 - 100.0 fL   MCH 29.4 26.0 - 34.0 pg   MCHC 31.3 30.0 - 36.0 g/dL   RDW 14.2 11.5 - 15.5 %  Platelets 381 150 - 400 K/uL   nRBC 0.0 0.0 - 0.2 %    Comment: Performed at [facility], [place], [place] [address]  Troponin I (High Sensitivity)     Status: None   Collection Time: 01/03/23  9:05 PM  Result Value Ref Range   Troponin I (High Sensitivity) 6 <18 ng/L    Comment: (NOTE) Elevated high sensitivity troponin I (hsTnI) values and significant  changes across serial measurements may suggest ACS but many other  chronic and acute conditions are known to elevate hsTnI results.  Refer to the "Links" section for chest pain algorithms and additional  guidance. Performed at [facility], [address] [place], [place] [address]   Urinalysis, Routine w reflex microscopic Urine, Clean Catch     Status: Abnormal   Collection Time: 01/03/23  9:05 PM  Result Value Ref Range   Color, Urine YELLOW (A) YELLOW   APPearance CLEAR (A) CLEAR   Specific Gravity, Urine 1.025 1.005 - 1.030   pH 5.0 5.0 - 8.0   Glucose, UA NEGATIVE NEGATIVE mg/dL   Hgb urine dipstick NEGATIVE NEGATIVE   Bilirubin Urine NEGATIVE NEGATIVE   Ketones, ur 20 (A) NEGATIVE mg/dL   Protein, ur 30 (A) NEGATIVE mg/dL   Nitrite NEGATIVE NEGATIVE   Leukocytes,Ua NEGATIVE NEGATIVE   RBC / HPF 0-5 0 - 5 RBC/hpf    WBC, UA 0-5 0 - 5 WBC/hpf   Bacteria, UA RARE (A) NONE SEEN   Squamous Epithelial / HPF 0-5 0 - 5 /HPF   Mucus PRESENT     Comment: Performed at [facility], [address] [place], [place] [address]  Urine Drug Screen, Qualitative ([facility] only)     Status: None   Collection Time: 01/03/23  9:05 PM  Result Value Ref Range   Tricyclic, Ur Screen NONE DETECTED NONE DETECTED   Amphetamines, Ur Screen NONE DETECTED NONE DETECTED   MDMA (Ecstasy)Ur Screen NONE DETECTED NONE DETECTED   Cocaine Metabolite,Ur [place] NONE DETECTED NONE DETECTED   Opiate, Ur Screen NONE DETECTED NONE DETECTED   Phencyclidine (PCP) Ur S NONE DETECTED NONE DETECTED   Cannabinoid 50 Ng, Ur [place] NONE DETECTED NONE DETECTED   Barbiturates, Ur Screen NONE DETECTED NONE DETECTED   Benzodiazepine, Ur Scrn NONE DETECTED NONE DETECTED   Methadone Scn, Ur NONE DETECTED NONE DETECTED    Comment: (NOTE) Tricyclics + metabolites, urine    Cutoff 1000 ng/mL Amphetamines + metabolites, urine  Cutoff 1000 ng/mL MDMA (Ecstasy), urine              Cutoff 500 ng/mL Cocaine Metabolite, urine          Cutoff 300 ng/mL Opiate + metabolites, urine        Cutoff 300 ng/mL Phencyclidine (PCP), urine         Cutoff 25 ng/mL Cannabinoid, urine                 Cutoff 50 ng/mL Barbiturates + metabolites, urine  Cutoff 200 ng/mL Benzodiazepine, urine              Cutoff 200 ng/mL Methadone, urine                   Cutoff 300 ng/mL  The urine drug screen provides only a preliminary, unconfirmed analytical test result and should not be used for non-medical purposes. Clinical consideration and professional judgment should be applied to any positive drug screen result due to  possible interfering substances. A more specific alternate chemical method must be used in order to obtain a confirmed analytical result. Gas chromatography / mass spectrometry (GC/MS) is the preferred confirm atory method. Performed at [facility], [address] [place], [place] [address]   Resp panel by RT-PCR (RSV, Flu A&B, Covid) Anterior Nasal Swab     Status: None   Collection Time: 01/03/23  9:05 PM   Specimen: Anterior Nasal Swab  Result Value Ref Range   SARS Coronavirus 2 by RT PCR NEGATIVE NEGATIVE    Comment: (NOTE) SARS-CoV-2 target nucleic acids are NOT DETECTED.  The SARS-CoV-2 RNA is generally detectable in upper respiratory specimens during the acute phase of infection. The lowest concentration of SARS-CoV-2 viral copies this assay can detect is 138 copies/mL. A negative result does not preclude SARS-Cov-2 infection and should not be used as the sole basis for treatment or other patient management decisions. A negative result may occur with  improper specimen collection/handling, submission of specimen other than nasopharyngeal swab, presence of viral mutation(s) within the areas targeted by this assay, and inadequate number of viral copies(<138 copies/mL). A negative result must be combined with clinical observations, patient history, and epidemiological information. The expected result is Negative.  Fact Sheet for Patients:  EntrepreneurPulse.com.au  Fact Sheet for Healthcare Providers:  IncredibleEmployment.be  This test is no t yet approved or cleared by the [place] FDA and  has been authorized for detection and/or diagnosis of SARS-CoV-2 by FDA under an Emergency Use Authorization (EUA). This EUA will remain  in effect (meaning this test can be used) for the duration of the COVID-19 declaration under Section 564(b)(1) of the Act, 21 U.S.C.section 360bbb-3(b)(1), unless the authorization is terminated  or revoked sooner.       Influenza A by PCR NEGATIVE NEGATIVE   Influenza B by PCR NEGATIVE NEGATIVE    Comment: (NOTE) The Xpert Xpress SARS-CoV-2/FLU/RSV plus assay is intended as an aid in the diagnosis of influenza from Nasopharyngeal swab specimens  and should not be used as a sole basis for treatment. Nasal washings and aspirates are unacceptable for Xpert Xpress SARS-CoV-2/FLU/RSV testing.  Fact Sheet for Patients: EntrepreneurPulse.com.au  Fact Sheet for Healthcare Providers: IncredibleEmployment.be  This test is not yet approved or cleared by the [place] FDA and has been authorized for detection and/or diagnosis of SARS-CoV-2 by FDA under an Emergency Use Authorization (EUA). This EUA will remain in effect (meaning this test can be used) for the duration of the COVID-19 declaration under Section 564(b)(1) of the Act, 21 U.S.C. section 360bbb-3(b)(1), unless the authorization is terminated or revoked.     Resp Syncytial Virus by PCR NEGATIVE NEGATIVE    Comment: (NOTE) Fact Sheet for Patients: EntrepreneurPulse.com.au  Fact Sheet for Healthcare Providers: IncredibleEmployment.be  This test is not yet approved or cleared by the [place] FDA and has been authorized for detection and/or diagnosis of SARS-CoV-2 by FDA under an Emergency Use Authorization (EUA). This EUA will remain in effect (meaning this test can be used) for the duration of the COVID-19 declaration under Section 564(b)(1) of the Act, 21 U.S.C. section 360bbb-3(b)(1), unless the authorization is terminated or revoked.  Performed at [facility], [address] [place],  [id number]   Salicylate level     Status: Abnormal   Collection Time: 01/03/23  9:05 PM  Result Value Ref Range   Salicylate Lvl 99991111 (H) 7.0 - 30.0 mg/dL    Comment: Performed at [facility], [address] [place], [place] [address]  Acetaminophen level     Status: Abnormal   Collection Time: 01/03/23  9:05 PM  Result Value Ref Range   Acetaminophen (Tylenol), Serum <10 (L) 10 - 30 ug/mL    Comment: (NOTE) Therapeutic concentrations vary significantly. A range of 10-30  ug/mL  may be an effective concentration for many patients. However, some  are best treated at concentrations outside of this range. Acetaminophen concentrations >150 ug/mL at 4 hours after ingestion  and >50 ug/mL at 12 hours after ingestion are often associated with  toxic reactions.  Performed at [facility], [address] [place], [place] [address]   Brain natriuretic peptide     Status: None   Collection Time: 01/03/23  9:05 PM  Result Value Ref Range   B Natriuretic Peptide 48.9 0.0 - 100.0 pg/mL    Comment: Performed at [facility], [address] [place], [place] [address]  D-dimer, quantitative     Status: Abnormal   Collection Time: 01/03/23  9:05 PM  Result Value Ref Range   D-Dimer, Quant 0.55 (H) 0.00 - 0.50 ug/mL-FEU    Comment: (NOTE) At the manufacturer cut-off value of 0.5 g/mL FEU, this assay has a negative predictive value of 95-100%.This assay is intended for use in conjunction with a clinical pretest probability (PTP) assessment model to exclude pulmonary embolism (PE) and deep venous thrombosis (DVT) in outpatients suspected of PE or DVT. Results should be correlated with clinical presentation. Performed at [facility], [address] [place], [place] [address]   Troponin I (High Sensitivity)     Status: None   Collection Time: 01/04/23 12:50 AM  Result Value Ref Range   Troponin I (High Sensitivity) 7 <18 ng/L    Comment: (NOTE) Elevated high sensitivity troponin I (hsTnI) values and significant  changes across serial measurements may suggest ACS but many other  chronic and acute conditions are known to elevate hsTnI results.  Refer to the "Links" section for chest pain algorithms and additional  guidance. Performed at [facility], [address] [place], [place] [address]   Blood culture (routine x 2)     Status: None (Preliminary result)   Collection Time: 01/04/23 12:50 AM   Specimen: BLOOD   Result Value Ref Range   Specimen Description BLOOD RIGTH FA    Special Requests      BOTTLES DRAWN AEROBIC AND ANAEROBIC Blood Culture adequate volume   Culture      NO GROWTH 1 DAY Performed at [facility], [address], [place], [place] [address]    Report Status PENDING   Blood culture (routine x 2)     Status: None (Preliminary result)   Collection Time: 01/04/23 12:50 AM   Specimen: BLOOD  Result Value Ref Range   Specimen Description BLOOD RIGHT AC    Special Requests      BOTTLES DRAWN AEROBIC AND ANAEROBIC Blood Culture adequate volume   Culture      NO GROWTH 1 DAY Performed at [facility], [address] [place], [place] [address]    Report Status PENDING   Lactic acid, plasma     Status: None   Collection Time: 01/04/23 12:50 AM  Result Value Ref Range   Lactic Acid, Venous 1.1 0.5 - 1.9 mmol/L    Comment: Performed at [facility], [address], [place], [place] [address]  Procalcitonin - Baseline     Status: None  Collection Time: 01/04/23 12:50 AM  Result Value Ref Range   Procalcitonin <0.10 ng/mL    Comment:        Interpretation: PCT (Procalcitonin) <= 0.5 ng/mL: Systemic infection (sepsis) is not likely. Local bacterial infection is possible. (NOTE)       Sepsis PCT Algorithm           Lower Respiratory Tract                                      Infection PCT Algorithm    ----------------------------     ----------------------------         PCT < 0.25 ng/mL                PCT < 0.10 ng/mL          Strongly encourage             Strongly discourage   discontinuation of antibiotics    initiation of antibiotics    ----------------------------     -----------------------------       PCT 0.25 - 0.50 ng/mL            PCT 0.10 - 0.25 ng/mL               OR       >80% decrease in PCT            Discourage initiation of                                            antibiotics      Encourage discontinuation           of  antibiotics    ----------------------------     -----------------------------         PCT >= 0.50 ng/mL              PCT 0.26 - 0.50 ng/mL               AND        <80% decrease in PCT             Encourage initiation of                                             antibiotics       Encourage continuation           of antibiotics    ----------------------------     -----------------------------        PCT >= 0.50 ng/mL                  PCT > 0.50 ng/mL               AND         increase in PCT                  Strongly encourage                                      initiation of antibiotics    Strongly encourage escalation  of antibiotics                                     -----------------------------                                           PCT <= 0.25 ng/mL                                                 OR                                        > 80% decrease in PCT                                      Discontinue / Do not initiate                                             antibiotics  Performed at [facility], [address] [place], [place] [address]   HIV Antibody (routine testing w rflx)     Status: None   Collection Time: 01/04/23 12:50 AM  Result Value Ref Range   HIV Screen 4th Generation wRfx Non Reactive Non Reactive    Comment: Performed at [facility], [place] [address] [place], [place] [address]  Lactic acid, plasma     Status: Abnormal   Collection Time: 01/04/23  2:54 AM  Result Value Ref Range   Lactic Acid, Venous 2.0 (HH) 0.5 - 1.9 mmol/L    Comment: CRITICAL RESULT CALLED TO, READ BACK BY AND VERIFIED WITH EDNA GARNETT @0323  ON 01/04/23 SKL Performed at [facility], [address] [place], [place] [address]   Blood gas, venous     Status: Abnormal   Collection Time: 01/04/23  2:54 AM  Result Value Ref Range   pH, Ven 7.38 7.25 - 7.43   pCO2, Ven 55 44 - 60 mmHg   pO2, Ven 72 (H) 32 - 45 mmHg   Bicarbonate 32.5  (H) 20.0 - 28.0 mmol/L   Acid-Base Excess 5.8 (H) 0.0 - 2.0 mmol/L   O2 Saturation 96 %   Patient temperature 37.0    Collection site VEIN     Comment: Performed at [facility], [address], [place], [place] [address]  Expectorated Sputum Assessment w Gram Stain, Rflx to Resp Cult     Status: None   Collection Time: 01/04/23 10:27 AM   Specimen: Expectorated Sputum  Result Value Ref Range   Specimen Description EXPECTORATED SPUTUM    Special Requests NONE    Sputum evaluation      Sputum specimen not acceptable for testing.  Please recollect.   [place] [id number] 01/04/23.PMF Performed at [facility], [address] [place], [place] [address]    Report Status 01/04/2023 FINAL   RPR  Status: None   Collection Time: 01/04/23 10:52 AM  Result Value Ref Range   RPR Ser Ql NON REACTIVE NON REACTIVE    Comment: Performed at [facility], [place] [address] [place], [place] [id number]  Salicylate level     Status: None   Collection Time: 01/04/23 10:52 AM  Result Value Ref Range   Salicylate Lvl 0000000 7.0 - 30.0 mg/dL    Comment: Performed at [facility], [address] [place], [place] [address]  Hepatitis B surface antigen     Status: None   Collection Time: 01/04/23 10:53 AM  Result Value Ref Range   Hepatitis B Surface Ag NON REACTIVE NON REACTIVE    Comment: Performed at [facility], [place] [address], [place], [place] [id number]  Basic metabolic panel     Status: Abnormal   Collection Time: 01/04/23 10:53 AM  Result Value Ref Range   Sodium 137 135 - 145 mmol/L   Potassium 4.0 3.5 - 5.1 mmol/L   Chloride 97 (L) 98 - 111 mmol/L   CO2 29 22 - 32 mmol/L   Glucose, Bld 237 (H) 70 - 99 mg/dL    Comment: Glucose reference range applies only to samples taken after fasting for at least 8 hours.   BUN 17 6 - 20 mg/dL   Creatinine, Ser 0.71 0.61 - 1.24 mg/dL   Calcium 8.4 (L) 8.9 - 10.3 mg/dL   GFR, Estimated >60 >60 mL/min     Comment: (NOTE) Calculated using the CKD-EPI Creatinine Equation (2021)    Anion gap 11 5 - 15    Comment: Performed at [facility], [address] [place], [place] [address]  CK     Status: Abnormal   Collection Time: 01/04/23 10:53 AM  Result Value Ref Range   Total CK 29 (L) 49 - 397 U/L    Comment: Performed at [facility], [address] [place], [place] [id number]  Basic metabolic panel     Status: Abnormal   Collection Time: 01/05/23  4:05 AM  Result Value Ref Range   Sodium 138 135 - 145 mmol/L   Potassium 4.6 3.5 - 5.1 mmol/L   Chloride 99 98 - 111 mmol/L   CO2 32 22 - 32 mmol/L   Glucose, Bld 138 (H) 70 - 99 mg/dL    Comment: Glucose reference range applies only to samples taken after fasting for at least 8 hours.   BUN 18 6 - 20 mg/dL   Creatinine, Ser 0.58 (L) 0.61 - 1.24 mg/dL   Calcium 8.2 (L) 8.9 - 10.3 mg/dL   GFR, Estimated >60 >60 mL/min    Comment: (NOTE) Calculated using the CKD-EPI Creatinine Equation (2021)    Anion gap 7 5 - 15    Comment: Performed at [facility], [address] [place],  [address]    Current Facility-Administered Medications  Medication Dose Route Frequency Provider Last Rate Last Admin   acetaminophen (TYLENOL) tablet 650 mg  650 mg Oral Q6H PRN Para Skeans, MD   650 mg at 01/05/23 1031   Or   acetaminophen (TYLENOL) suppository 650 mg  650 mg Rectal Q6H PRN Para Skeans, MD       azithromycin [facility]) tablet 250 mg  250 mg Oral Daily Gwynne Edinger, MD   250 mg at 01/05/23 0906   bisacodyl (DULCOLAX) EC tablet 5 mg  5 mg Oral Daily PRN Para Skeans, MD  buprenorphine-naloxone (SUBOXONE) 2-0.5 mg per SL tablet 1 tablet  1 tablet Sublingual TID Gwynne Edinger, MD   1 tablet at 01/05/23 0906   docusate sodium (COLACE) capsule 100 mg  100 mg Oral BID Florina Ou V, MD   100 mg at 01/05/23 0906   enoxaparin (LOVENOX) injection 40 mg  40 mg Subcutaneous Q24H Florina Ou V, MD    40 mg at 01/04/23 2143   escitalopram (LEXAPRO) tablet 5 mg  5 mg Oral Daily Patrecia Pour, NP   5 mg at 01/05/23 9528   feeding supplement (ENSURE ENLIVE / ENSURE PLUS) liquid 237 mL  237 mL Oral TID BM Wouk, Ailene Rud, MD   237 mL at 01/05/23 0909   guaiFENesin (ROBITUSSIN) 100 MG/5ML liquid 5 mL  5 mL Oral Q4H PRN Para Skeans, MD       hydrALAZINE (APRESOLINE) injection 5 mg  5 mg Intravenous Q4H PRN Para Skeans, MD       ipratropium-albuterol (DUONEB) 0.5-2.5 (3) MG/3ML nebulizer solution 3 mL  3 mL Nebulization TID Gwynne Edinger, MD   3 mL at 01/05/23 0737   multivitamin with minerals tablet 1 tablet  1 tablet Oral Daily Gwynne Edinger, MD   1 tablet at 01/05/23 4132   nicotine (NICODERM CQ - dosed in mg/24 hours) patch 14 mg  14 mg Transdermal Daily Florina Ou V, MD   14 mg at 01/05/23 0906   ondansetron (ZOFRAN) tablet 4 mg  4 mg Oral Q6H PRN Para Skeans, MD       Or   ondansetron (ZOFRAN) injection 4 mg  4 mg Intravenous Q6H PRN Para Skeans, MD       polyethylene glycol (MIRALAX / GLYCOLAX) packet 17 g  17 g Oral Daily PRN Para Skeans, MD       predniSONE (DELTASONE) tablet 40 mg  40 mg Oral Q breakfast Florina Ou V, MD   40 mg at 01/05/23 1202   sodium chloride flush (NS) 0.9 % injection 3 mL  3 mL Intravenous Q12H Para Skeans, MD   3 mL at 01/05/23 0910    Musculoskeletal: Strength & Muscle Tone: decreased Gait & Station:  did not witness Patient leans: N/A  Psychiatric Specialty Exam: Physical Exam Vitals and nursing note reviewed.  Constitutional:      Appearance: Normal appearance.  HENT:     Head: Normocephalic.     Nose: Nose normal.  Pulmonary:     Effort: Pulmonary effort is normal.  Musculoskeletal:        General: Normal range of motion.     Cervical back: Normal range of motion.  Neurological:     General: No focal deficit present.     Mental Status: He is alert and oriented to person, place, and time.  Psychiatric:         Attention and Perception: Attention and perception normal.        Mood and Affect: Mood is anxious and depressed.        Speech: Speech normal.        Behavior: Behavior normal. Behavior is cooperative.        Thought Content: Thought content normal.        Cognition and Memory: Cognition and memory normal.        Judgment: Judgment normal.     Review of Systems  Psychiatric/Behavioral:  Positive for depression. The patient is nervous/anxious.   All other systems reviewed  and are negative.   Blood pressure 103/67, pulse 65, temperature 98.2 F (36.8 C), temperature source Oral, resp. rate 16, height 5\' 7"  (1.702 m), weight 54.4 kg, SpO2 90 %.Body mass index is 18.79 kg/m.  General Appearance: Fairly Groomed  Eye Contact:  Good  Speech:  Clear and Coherent  Volume:  Normal  Mood:  Anxious and Depressed  Affect:  congruent  Thought Process:  Coherent and linear  Orientation:  Full (Time, Place, and Person)  Thought Content:  WDL  Suicidal Thoughts:  No  Homicidal Thoughts:  No  Memory:  Negative  Judgement:  Fair  Insight:  Fair  Psychomotor Activity:  decreased  Concentration:  Concentration: Good and Attention Span: Good  Recall:  Good  Fund of Knowledge:  Good  Language:  Good  Akathisia:  Negative  Handed:  Right  AIMS (if indicated):     Assets:  Communication Skills Desire for Improvement Housing Vocational/Educational  ADL's:  Intact  Cognition:  WNL  Sleep:        Physical Exam: Physical Exam Vitals and nursing note reviewed.  Constitutional:      Appearance: Normal appearance.  HENT:     Head: Normocephalic.     Nose: Nose normal.  Pulmonary:     Effort: Pulmonary effort is normal.  Musculoskeletal:        General: Normal range of motion.     Cervical back: Normal range of motion.  Neurological:     General: No focal deficit present.     Mental Status: He is alert and oriented to person, place, and time.  Psychiatric:        Attention and  Perception: Attention and perception normal.        Mood and Affect: Mood is anxious and depressed.        Speech: Speech normal.        Behavior: Behavior normal. Behavior is cooperative.        Thought Content: Thought content normal.        Cognition and Memory: Cognition and memory normal.        Judgment: Judgment normal.    Review of Systems  Psychiatric/Behavioral:  Positive for depression. The patient is nervous/anxious.   All other systems reviewed and are negative.  Blood pressure 103/67, pulse 65, temperature 98.2 F (36.8 C), temperature source Oral, resp. rate 16, height 5\' 7"  (1.702 m), weight 54.4 kg, SpO2 90 %. Body mass index is 18.79 kg/m.  Treatment Plan Summary: Major depressive disorder, recurrent, moderate: Continue Lexapro 5 mg daily Follow up with [facility], information placed in discharge instructions  Disposition: No evidence of imminent risk to self or others at present.   Patient does not meet criteria for psychiatric inpatient admission. Supportive therapy provided about ongoing stressors.  Waylan Boga, NP 01/05/2023 12:21 PM

## 2023-01-05 NOTE — TOC Initial Note (Addendum)
Transition of Care (TOC) - Initial/Assessment Note    Patient Details  Name: Corey House. MRN: [id number] Date of Birth: [birth year]  Transition of Care [facility]) CM/SW Contact:    Magnus Ivan, LCSW Phone Number: 01/05/2023, 9:47 AM  Clinical Narrative:         [name] spoke with patient. Patient is uninsured and was flagged for SDOH food.  Patient states he lives at a boarding home and drives himself. His car is sometimes unreliable, he states his sister is able to provide transportation when needed. Patient states he is interested in PCP and Medication resources since he is uninsured, [name] explained and provided application for [employer].  Patient states he is in the process of applying for disability.  Patient declines OPPT, he agreed to let staff know if he changes his mind. Patient is agreeable to a shower seat. Referral made to [facility] with [employer] for a charity shower seat to be delivered to the bedside today. Asked MD for order. Patient is agreeable to food resources. Food resources have been added to AVS. Patient declines additional resource needs at this time.          10:45- Notified by OT that patient also needs pulse ox. TOC RNCM to bring charity pulse ox to bedside prior to DC.   Expected Discharge Plan: Home/Self Care Barriers to Discharge: Continued Medical Work up   Patient Goals and CMS Choice Patient states their goals for this hospitalization and ongoing recovery are:: home- resides at boarding house CMS Medicare.gov Compare Post Acute Care list provided to:: Patient Choice offered to / list presented to : Patient      Expected Discharge Plan and Services       Living arrangements for the past 2 months: [place]                 DME Arranged: Shower stool DME Agency: [employer] Date DME Agency Contacted: 01/05/23   Representative spoke with at DME Agency: Delana Meyer            Prior Living  Arrangements/Services Living arrangements for the past 2 months: [place] with:: Self Patient language and need for interpreter reviewed:: Yes Do you feel safe going back to the place where you live?: Yes      Need for Family Participation in Patient Care: Yes (Comment) Care giver support system in place?: Yes (comment)   Criminal Activity/Legal Involvement Pertinent to Current Situation/Hospitalization: No - Comment as needed  Activities of Daily Living Home Assistive Devices/Equipment: None ADL Screening (condition at time of admission) Patient's cognitive ability adequate to safely complete daily activities?: Yes Is the patient deaf or have difficulty hearing?: No Does the patient have difficulty seeing, even when wearing glasses/contacts?: No Does the patient have difficulty concentrating, remembering, or making decisions?: No Patient able to express need for assistance with ADLs?: Yes Does the patient have difficulty dressing or bathing?: No Independently performs ADLs?: Yes (appropriate for developmental age) Does the patient have difficulty walking or climbing stairs?: No Weakness of Legs: None Weakness of Arms/Hands: None  Permission Sought/Granted Permission sought to share information with : Facility Art therapist granted to share information with : Yes, Verbal Permission Granted     Permission granted to share info w AGENCY: DME agencies        Emotional Assessment       Orientation: : Oriented to Self, Oriented to Place, Oriented to  Time, Oriented to Situation Alcohol /  Substance Use: Not Applicable Psych Involvement: Yes (comment)  Admission diagnosis:  Cough [R05.9] Acute respiratory failure with hypoxia (HCC) [J96.01] Sepsis, due to unspecified organism, unspecified whether acute organ dysfunction present [facility]) [A41.9] Patient Active Problem List   Diagnosis Date Noted   Major depressive disorder, recurrent episode, moderate  ([place]) 01/04/2023   Pressure injury of skin 01/04/2023   Protein-calorie malnutrition, severe 01/04/2023   COPD with acute exacerbation ([place]) 01/03/2023   Tobacco abuse 01/03/2023   Acute respiratory failure with hypoxia ([place]) 01/03/2023   Cough 01/03/2023   PCP:  Patient, [name] Pharmacy:   [id number] - [place], [place] [place] [address] Phone: [phone] Fax: [phone]  [id number] - [place], [place] - [address] [place] [address] Phone: [phone] Fax: [phone]     Social Determinants of Health (SDOH) Social History: SDOH Screenings   Food Insecurity: Food Insecurity Present (01/04/2023)  Housing: Low Risk  (01/04/2023)  Transportation Needs: No Transportation Needs (01/04/2023)  Utilities: Not At Risk (01/04/2023)  Tobacco Use: High Risk (01/03/2023)   SDOH Interventions:     Readmission Risk Interventions     No data to display

## 2023-01-05 NOTE — Evaluation (Signed)
Occupational Therapy Evaluation Patient Details Name: Corey House. MRN: [id number] DOB: Nov 28, 1966 Today's Date: 01/05/2023   History of Present Illness Blaise Grieshaber is a 15yoM who [facility] via [facility] c suicidal ideation/cough. Pt remains SOB despite breathing treatments at urgent care previously. In ED pt hypoxic requiring supplemental O2 up to 5L. Chest CT negative for PE.   Clinical Impression   Mr. Sanger was seen for OT evaluation this date. Prior to hospital admission, pt was independent in all aspects of ADL/IADL, however, he endorses becoming increasingly fatigued and SOB during basic daily activities and expresses a significant fear of falling with 2 recent falls in the last 6 months. Pt endorses strong fear of falling in the shower more than any other place in his home. Pt lives in a multi-level boarding house and does not use O2 at baseline.   On this date he is received on 1 L [place] with SpO2 of 88% at rest. Pt increased to 2L [place] on O2 tank in preparation for functional activity. Pt cleared by RN to complete shower during OT session. OT facilitated shower and dressing tasks as described below (see ADL section for additional detail). Pt educated on safety, falls prevention, safe use of AE/DME, and energy conservation. Pt on 2L [place] t/o showering task with spO2 WFL (92-93%). He became quite SOB after drying off and dressing and with spO2 noted to be 80% HR in the 90's after this activity. O2 increased to 3L and pt encouraged to engage in learned ECS including therapeutic rest break and PLB. O2 sats rebounded to 95-97%. Pt maintained at 93% once back to bed and on 2L [place] at end of session. RN/MD Notified via secure chat. Pt is able to return verbalize/demonstrate understanding of education provided t/o session. Recommend pt DC with a 3 in 1 BSC and shower chair to maximize safety and minimize risk of falls/re-admission upon acute hospital DC. All education/assessment completed  at time of initial OT evaluation. No further skilled needs identified. OT will sign off at this time. Please re-consult if additional OT needs arise.       Recommendations for follow up therapy are one component of a multi-disciplinary discharge planning process, led by the attending physician.  Recommendations may be updated based on patient status, additional functional criteria and insurance authorization.   Follow Up Recommendations  No OT follow up     Assistance Recommended at Discharge PRN  Patient can return home with the following      Functional Status Assessment  Patient has not had a recent decline in their functional status  Equipment Recommendations  BSC/3in1;Tub/shower seat    Recommendations for Other Services       Precautions / Restrictions Precautions Precautions: Fall Precaution Comments: Moderate Fall Restrictions Weight Bearing Restrictions: No      Mobility Bed Mobility Overal bed mobility: Independent                  Transfers Overall transfer level: Independent                        Balance Overall balance assessment: History of Falls, No apparent balance deficits (not formally assessed)                                         ADL either performed or assessed  with clinical judgement   ADL Overall ADL's : Needs assistance/impaired                                       General ADL Comments: Pt performs bed/functional mobility independently during session. He requires SBA for management of O2 tank and SET UP assist for showering supplies. He performs bathing with min cueing for implementation of energy conservation strategies but is able to progress to MOD I to perform seated shower and seated LB/UB dressing.     Vision Patient Visual Report: No change from baseline       Perception     Praxis      Pertinent Vitals/Pain Pain Assessment Pain Assessment: No/denies pain     Hand  Dominance     Extremity/Trunk Assessment Upper Extremity Assessment Upper Extremity Assessment: Overall WFL for tasks assessed   Lower Extremity Assessment Lower Extremity Assessment: Overall WFL for tasks assessed       Communication Communication Communication: No difficulties   Cognition Arousal/Alertness: Awake/alert Behavior During Therapy: WFL for tasks assessed/performed, Anxious, Restless Overall Cognitive Status: Within Functional Limits for tasks assessed                                       General Comments       Exercises Other Exercises Other Exercises: OT facilitated shower and dressing tasks during session with ongoing education re: safety, falls prevention, energy conservation strategies, and safe transfer techniques.   Shoulder Instructions      Home Living Family/patient expects to be discharged to:: Other (Comment) (boarding house) Living Arrangements: Alone     Home Access: Stairs to enter Entrance Stairs-Number of Steps: 1-2 flights   Home Layout: Two level Alternate Level Stairs-Number of Steps: flight             Home Equipment: None          Prior Functioning/Environment Prior Level of Function : Working/employed;History of Falls (last six months);Driving             Mobility Comments: Pt reports impaired balance and 2+ falls in last year. ADLs Comments: Pt is generally independent with ADL management however, he reports feeling increasingly unsafe during exertional ADL tasks including showering. No home O2 use at baseline.        OT Problem List: Decreased strength;Cardiopulmonary status limiting activity;Decreased safety awareness;Decreased knowledge of use of DME or AE      OT Treatment/Interventions:      OT Goals(Current goals can be found in the care plan section) Acute Rehab OT Goals Patient Stated Goal: To feel better OT Goal Formulation: All assessment and education complete, DC therapy Time For  Goal Achievement: 01/05/23 Potential to Achieve Goals: Good  OT Frequency:      Co-evaluation              AM-PAC OT "6 Clicks" Daily Activity     Outcome Measure Help from another person eating meals?: None Help from another person taking care of personal grooming?: None Help from another person toileting, which includes using toliet, bedpan, or urinal?: None Help from another person bathing (including washing, rinsing, drying)?: A Little Help from another person to put on and taking off regular upper body clothing?: None Help from another person to put on and taking off  regular lower body clothing?: None 6 Click Score: 23   End of Session Equipment Utilized During Treatment: Oxygen Nurse Communication: Other (comment) (MD/RN notified of pt spO2 and O2 requirement t/o session.)  Activity Tolerance: Patient tolerated treatment well Patient left: in bed;with call bell/phone within reach;with bed alarm set                   Time: 7564-3329 OT Time Calculation (min): 50 min Charges:  OT General Charges $OT Visit: 1 Visit OT Evaluation $OT Eval Moderate Complexity: 1 Mod OT Treatments $Self Care/Home Management : 38-52 mins  Shara Blazing, M.S., OTR/L 01/05/23, 10:34 AM

## 2023-01-06 LAB — BASIC METABOLIC PANEL
Anion gap: 4 — ABNORMAL LOW (ref 5–15)
BUN: 18 mg/dL (ref 6–20)
CO2: 34 mmol/L — ABNORMAL HIGH (ref 22–32)
Calcium: 8.5 mg/dL — ABNORMAL LOW (ref 8.9–10.3)
Chloride: 105 mmol/L (ref 98–111)
Creatinine, Ser: 0.63 mg/dL (ref 0.61–1.24)
GFR, Estimated: >60 mL/min (ref >60)
Glucose, Bld: 100 mg/dL — ABNORMAL HIGH (ref 70–99)
Potassium: 4.3 mmol/L (ref 3.5–5.1)
Sodium: 143 mmol/L (ref 135–145)

## 2023-01-06 MED ORDER — IPRATROPIUM-ALBUTEROL 0.5-2.5 (3) MG/3ML IN SOLN
3.0000 mL | Freq: Two times a day (BID) | RESPIRATORY_TRACT | Status: DC
  Administered 2023-01-06 – 2023-01-07 (×2): 3 mL via RESPIRATORY_TRACT
  Filled 2023-01-06 (×2): qty 3

## 2023-01-06 NOTE — Progress Notes (Signed)
PROGRESS NOTE    Corey House.  JYN:[id number]  DOB: 07-31-1966  DOA: 01/03/2023 PCP: Patient, [name] Outpatient Specialists:   Hospital course:  57 year old male was brought to the [facility] for concern for suicidal ideation and was transferred to the ED.  In the ED patient denied any intent to hurt himself or anybody else but he was noted to be somewhat hypoxic.  Patient was admitted for COPD flare   Subjective:  Patient states that he continues to improve.  Notes he is able to wal. Lk to the bathroom without shortness of breath however notes he gets significant episodes of coughing if he tries to talk too much.  Also notes that he is not on any oxygen at baseline.   Objective: Vitals:   01/05/23 2051 01/06/23 0453 01/06/23 0744 01/06/23 0803  BP: 108/73 118/78  124/83  Pulse: 65 (!) 56  (!) 57  Resp: 20 18  18   Temp: 97.9 F (36.6 C) 97.7 F (36.5 C)  97.8 F (36.6 C)  TempSrc:  Oral  Oral  SpO2: 94% 96% 93% 95%  Weight:      Height:        Intake/Output Summary (Last 24 hours) at 01/06/2023 1125 Last data filed at 01/06/2023 0803 Gross per 24 hour  Intake 240 ml  Output 450 ml  Net -210 ml    Filed Weights   01/03/23 2101  Weight: 54.4 kg     Exam:  General: Patient sitting up in bed, speaking in reasonable sized sentences however has significant coughing with deep breathing and when he attempts to take breaths during speaking Eyes: sclera anicteric, conjuctiva mild injection bilaterally CVS: S1-S2, regular  Respiratory:  decreased air entry bilaterally secondary to decreased inspiratory effort, rales at bases, coughing with deep inspiration. GI: NABS, soft, NT  LE: Warm and well-perfused Neuro: A/O x 3,  grossly nonfocal.  Psych: patient is logical and coherent, judgement and insight appear normal, mood and affect appropriate to situation.  Data Reviewed:  Basic Metabolic Panel: Recent Labs  Lab 01/03/23 2105  01/04/23 1053 01/05/23 0405 01/06/23 0503  NA 139 137 138 143  K 5.3* 4.0 4.6 4.3  CL 97* 97* 99 105  CO2 32 29 32 34*  GLUCOSE 110* 237* 138* 100*  BUN 18 17 18 18   CREATININE 0.82 0.71 0.58* 0.63  CALCIUM 9.2 8.4* 8.2* 8.5*     CBC: Recent Labs  Lab 01/03/23 2105  WBC 12.6*  HGB 16.4  HCT 52.4*  MCV 93.9  PLT 381      Scheduled Meds:  azithromycin  250 mg Oral Daily   buprenorphine-naloxone  1 tablet Sublingual TID   docusate sodium  100 mg Oral BID   enoxaparin (LOVENOX) injection  40 mg Subcutaneous Q24H   escitalopram  5 mg Oral Daily   feeding supplement  237 mL Oral TID BM   ipratropium-albuterol  3 mL Nebulization TID   multivitamin with minerals  1 tablet Oral Daily   nicotine  14 mg Transdermal Daily   predniSONE  40 mg Oral Q breakfast   sodium chloride flush  3 mL Intravenous Q12H   Continuous Infusions:   Assessment & Plan:   COPD flare Bronchospasm is improving but still present given significant cough on deep inspiration Will try to taper oxygen to off today with hope to possible discharge tomorrow Continue steroids and inhaled bronchodilators CTA was negative for PE Continue azithromycin  Depression Seen by  psychiatry No need for IVC at present Appreciate consultation and management Continue escitalopram Follow-up with [facility] per their recommendations  Opioid use disorder Continue Suboxone     Studies: No results found.  Principal Problem:   Cough Active Problems:   Acute respiratory failure with hypoxia (HCC)   COPD with acute exacerbation (HCC)   Tobacco abuse   Major depressive disorder, recurrent episode, moderate ([place])   Pressure injury of skin   Protein-calorie malnutrition, severe     Radford Pease Tublu Mireya Meditz, [facility]  If 7PM-7AM, please contact night-coverage [email or web address]   LOS: 2 days

## 2023-01-06 NOTE — Plan of Care (Signed)
  Problem: Education: Goal: Knowledge of disease or condition will improve Outcome: Progressing Goal: Knowledge of the prescribed therapeutic regimen will improve Outcome: Progressing Goal: Individualized Educational Video(s) Outcome: Progressing   Problem: Activity: Goal: Ability to tolerate increased activity will improve Outcome: Progressing Goal: Will verbalize the importance of balancing activity with adequate rest periods Outcome: Progressing   Problem: Respiratory: Goal: Ability to maintain a clear airway will improve Outcome: Progressing Goal: Levels of oxygenation will improve Outcome: Progressing Goal: Ability to maintain adequate ventilation will improve Outcome: Progressing   Problem: Education: Goal: Knowledge of General Education information will improve Description: Including pain rating scale, medication(s)/side effects and non-pharmacologic comfort measures Outcome: Progressing   Problem: Health Behavior/Discharge Planning: Goal: Ability to manage health-related needs will improve Outcome: Progressing   Problem: Clinical Measurements: Goal: Ability to maintain clinical measurements within normal limits will improve Outcome: Progressing Goal: Will remain free from infection Outcome: Progressing Goal: Diagnostic test results will improve Outcome: Progressing Goal: Respiratory complications will improve Outcome: Progressing Goal: Cardiovascular complication will be avoided Outcome: Progressing   Problem: Activity: Goal: Risk for activity intolerance will decrease Outcome: Progressing   Problem: Nutrition: Goal: Adequate nutrition will be maintained Outcome: Progressing   Problem: Coping: Goal: Level of anxiety will decrease Outcome: Progressing   Problem: Elimination: Goal: Will not experience complications related to bowel motility Outcome: Progressing Goal: Will not experience complications related to urinary retention Outcome: Progressing    Problem: Pain Managment: Goal: General experience of comfort will improve Outcome: Progressing   Problem: Safety: Goal: Ability to remain free from injury will improve Outcome: Progressing   Problem: Skin Integrity: Goal: Risk for impaired skin integrity will decrease Outcome: Progressing   Problem: Education: Goal: Knowledge of General Education information will improve Description: Including pain rating scale, medication(s)/side effects and non-pharmacologic comfort measures Outcome: Progressing   Problem: Health Behavior/Discharge Planning: Goal: Ability to manage health-related needs will improve Outcome: Progressing   Problem: Clinical Measurements: Goal: Ability to maintain clinical measurements within normal limits will improve Outcome: Progressing Goal: Will remain free from infection Outcome: Progressing Goal: Diagnostic test results will improve Outcome: Progressing Goal: Respiratory complications will improve Outcome: Progressing Goal: Cardiovascular complication will be avoided Outcome: Progressing   Problem: Activity: Goal: Risk for activity intolerance will decrease Outcome: Progressing   Problem: Nutrition: Goal: Adequate nutrition will be maintained Outcome: Progressing   Problem: Coping: Goal: Level of anxiety will decrease Outcome: Progressing   Problem: Elimination: Goal: Will not experience complications related to bowel motility Outcome: Progressing Goal: Will not experience complications related to urinary retention Outcome: Progressing   Problem: Pain Managment: Goal: General experience of comfort will improve Outcome: Progressing   Problem: Safety: Goal: Ability to remain free from injury will improve Outcome: Progressing   Problem: Skin Integrity: Goal: Risk for impaired skin integrity will decrease Outcome: Progressing

## 2023-01-06 NOTE — Progress Notes (Signed)
PT Cancellation Note  Patient Details Name: Corey House. MRN: [id number] DOB: 05/08/1966   Cancelled Treatment:    Reason Eval/Treat Not Completed: Fatigue/lethargy limiting ability to participate  Offered and encouraged session.  Pt stated he was fatigued and not interested in therapy at this time.  Stated he has been up walking today.  Will return at a later time/date.   Chesley Noon 01/06/2023, 2:10 PM

## 2023-01-07 ENCOUNTER — Other Ambulatory Visit: Payer: Self-pay

## 2023-01-07 LAB — BASIC METABOLIC PANEL
Anion gap: 8 (ref 5–15)
BUN: 16 mg/dL (ref 6–20)
CO2: 30 mmol/L (ref 22–32)
Calcium: 8.6 mg/dL — ABNORMAL LOW (ref 8.9–10.3)
Chloride: 103 mmol/L (ref 98–111)
Creatinine, Ser: 0.67 mg/dL (ref 0.61–1.24)
GFR, Estimated: >60 mL/min (ref >60)
Glucose, Bld: 77 mg/dL (ref 70–99)
Potassium: 4.1 mmol/L (ref 3.5–5.1)
Sodium: 141 mmol/L (ref 135–145)

## 2023-01-07 LAB — HCV INTERPRETATION

## 2023-01-07 LAB — HCV AB W REFLEX TO QUANT PCR: HCV Ab: NONREACTIVE

## 2023-01-07 MED ORDER — TRELEGY ELLIPTA 100-62.5-25 MCG/ACT IN AEPB
1.0000 | INHALATION_SPRAY | Freq: Every day | RESPIRATORY_TRACT | 2 refills | Status: DC
  Filled 2023-01-07: qty 1, fill #0

## 2023-01-07 MED ORDER — ENSURE ENLIVE PO LIQD: 237.0000 mL | Freq: Three times a day (TID) | ORAL | Status: DC

## 2023-01-07 MED ORDER — IPRATROPIUM-ALBUTEROL 0.5-2.5 (3) MG/3ML IN SOLN
3.0000 mL | Freq: Three times a day (TID) | RESPIRATORY_TRACT | Status: DC
  Administered 2023-01-07: 3 mL via RESPIRATORY_TRACT
  Filled 2023-01-07: qty 3

## 2023-01-07 MED ORDER — ESCITALOPRAM OXALATE 10 MG PO TABS
10.0000 mg | ORAL_TABLET | Freq: Every day | ORAL | 2 refills | Status: DC
  Filled 2023-01-07: qty 30, 30d supply, fill #0

## 2023-01-07 MED ORDER — FLUTICASONE FUROATE-VILANTEROL 200-25 MCG/ACT IN AEPB
1.0000 | INHALATION_SPRAY | Freq: Every day | RESPIRATORY_TRACT | 2 refills | Status: AC
  Filled 2023-01-07: qty 30, 30d supply, fill #0

## 2023-01-07 MED ORDER — NICOTINE 14 MG/24HR TD PT24
14.0000 mg | MEDICATED_PATCH | Freq: Every day | TRANSDERMAL | 0 refills | Status: DC
  Filled 2023-01-07: qty 28, 28d supply, fill #0

## 2023-01-07 MED ORDER — ADULT MULTIVITAMIN W/MINERALS CH: 1.0000 | ORAL_TABLET | Freq: Every day | ORAL | Status: DC

## 2023-01-07 MED ORDER — FLUTICASONE FUROATE-VILANTEROL 200-25 MCG/ACT IN AEPB
1.0000 | INHALATION_SPRAY | Freq: Every day | RESPIRATORY_TRACT | Status: DC
  Administered 2023-01-07: 1 via RESPIRATORY_TRACT
  Filled 2023-01-07: qty 28

## 2023-01-07 NOTE — Discharge Summary (Addendum)
Physician Discharge Summary   Corey House.  male DOB: 1966-05-09  GY:[id number]  PCP: Patient, [name]  Admit date: 01/03/2023 Discharge date: 01/07/2023  Admitted From: home Disposition:  home CODE STATUS: Full code  Discharge Instructions     No wound care   Complete by: As directed       Hospital Course:  For full details, please see H&P, progress notes, consult notes and ancillary notes.  Briefly,  Corey House. Is a 57 year old male was brought to the [facility] for concern for suicidal ideation and was transferred to the ED. In the ED patient denied any intent to hurt himself or anybody else but he was noted to be hypoxic. Patient was admitted for COPD flare   Acute hypoxemic respiratory failure 2/2 COPD exacerbation --O2 sats 85% on room air on presentation.  Pt was weaned down to room air prior to discharge. --Pt finished 4 days of steroid and azithromycin --pt was started on Breo Ellipta   Depression Seen by psychiatry No need for IVC at present, nor need for inpatient psych admission. --started on escitalopram  Follow-up with [facility] per psych recommendations   Opioid use disorder Pt reportedly on home Suboxone  Pressure Injury Coccyx Mid Stage 1, POA    Discharge Diagnoses:  Principal Problem:   Cough Active Problems:   Acute respiratory failure with hypoxia (HCC)   COPD with acute exacerbation (HCC)   Tobacco abuse   Major depressive disorder, recurrent episode, moderate (HCC)   Pressure injury of skin   Protein-calorie malnutrition, severe   30 Day Unplanned Readmission Risk Score    Flowsheet Row ED to Hosp-Admission (Current) from 01/03/2023 in [place]  30 Day Unplanned Readmission Risk Score (%) 11.07 Filed at 01/07/2023 1201       This score is the patient's risk of an unplanned readmission within 30 days of being discharged (0 -100%). The score is based on  dignosis, age, lab data, medications, orders, and past utilization.   Low:  0-14.9   Medium: 15-21.9   High: 22-29.9   Extreme: 30 and above         Discharge Instructions:  Allergies as of 01/07/2023   No Known Allergies      Medication List     TAKE these medications    escitalopram 10 MG tablet Commonly known as: LEXAPRO Take 1 tablet (10 mg total) by mouth daily.   feeding supplement Liqd Take 237 mLs by mouth 3 (three) times daily between meals.   fluticasone furoate-vilanterol 200-25 MCG/ACT Aepb Commonly known as: BREO ELLIPTA Inhale 1 puff into the lungs daily.   multivitamin with minerals Tabs tablet Take 1 tablet by mouth daily. Start taking on: January 08, 2023   nicotine 14 mg/24hr patch Commonly known as: NICODERM CQ - dosed in mg/24 hours Place 1 patch (14 mg total) onto the skin daily.               Durable Medical Equipment  (From admission, onward)           Start     Ordered   01/05/23 1120  For home use only DME Shower stool  Once        01/05/23 1119             Follow-up Information     [facility] Follow up in 1 week(s).  No Known Allergies   The results of significant diagnostics from this hospitalization (including imaging, microbiology, ancillary and laboratory) are listed below for reference.   Consultations:   Procedures/Studies: CT Angio Chest Pulmonary Embolism (PE) W or WO Contrast  Result Date: 01/04/2023 CLINICAL DATA:  Cough and difficulty breathing, initial encounter EXAM: CT ANGIOGRAPHY CHEST WITH CONTRAST TECHNIQUE: Multidetector CT imaging of the chest was performed using the standard protocol during bolus administration of intravenous contrast. Multiplanar CT image reconstructions and MIPs were obtained to evaluate the vascular anatomy. RADIATION DOSE REDUCTION: This exam was performed according to the departmental dose-optimization program which includes automated  exposure control, adjustment of the mA and/or kV according to patient size and/or use of iterative reconstruction technique. CONTRAST:  61mL OMNIPAQUE IOHEXOL 350 MG/ML SOLN COMPARISON:  Chest x-ray from the previous day, CT from 03/29/2011. FINDINGS: Cardiovascular: Mild atherosclerotic calcifications of the thoracic aorta are noted. No aneurysmal dilatation or dissection is seen.Pulmonary artery shows a normal branching pattern bilaterally. No filling defect to suggest pulmonary embolism is noted. No coronary calcifications are seen. Mediastinum/Nodes: Thoracic inlet is within normal limits. Scattered mediastinal lymph nodes are noted and stable from the prior exam. The esophagus is within normal limits. Bilateral hilar lymph nodes are seen more prominent than that noted on the prior exam likely of a reactive nature. Lungs/Pleura: Lungs are well aerated bilaterally. Diffuse emphysematous changes are seen. Bronchial thickening is noted particularly in the lower lobes bilaterally consistent with bronchitis. No focal infiltrate is seen. No sizable parenchymal nodule is noted. Upper Abdomen: Visualized upper abdomen is within normal limits. Musculoskeletal: Degenerative changes of the thoracic spine are noted. No acute rib abnormality is seen. Review of the MIP images confirms the above findings. IMPRESSION: Changes consistent with bronchitis in the lower lobes primarily with associated reactive hilar lymph nodes. No evidence of pulmonary emboli. No other focal abnormality is noted. Aortic Atherosclerosis (ICD10-I70.0) and Emphysema (ICD10-J43.9). Electronically Signed   By: Inez Catalina M.D.   On: 01/04/2023 01:52   DG Chest 2 View  Result Date: 01/03/2023 CLINICAL DATA:  Cough, hypoxia EXAM: CHEST - 2 VIEW COMPARISON:  04/16/2020 FINDINGS: Frontal and lateral views of the chest demonstrate an unremarkable cardiac silhouette. Lungs are hyperinflated with background emphysema. There is increased bilateral  bronchovascular prominence in a perihilar distribution, which may reflect bronchitis or viral pneumonitis. No lobar consolidation, effusion, or pneumothorax. No acute bony abnormalities. IMPRESSION: 1. Bilateral perihilar bronchovascular prominence consistent with bronchitis or viral pneumonitis. No lobar consolidation. 2. Emphysema. Electronically Signed   By: Randa Ngo M.D.   On: 01/03/2023 21:29      Labs: BNP (last 3 results) Recent Labs    01/03/23 2105  BNP 67.6   Basic Metabolic Panel: Recent Labs  Lab 01/03/23 2105 01/04/23 1053 01/05/23 0405 01/06/23 0503 01/07/23 0436  NA 139 137 138 143 141  K 5.3* 4.0 4.6 4.3 4.1  CL 97* 97* 99 105 103  CO2 32 29 32 34* 30  GLUCOSE 110* 237* 138* 100* 77  BUN 18 17 18 18 16   CREATININE 0.82 0.71 0.58* 0.63 0.67  CALCIUM 9.2 8.4* 8.2* 8.5* 8.6*   Liver Function Tests: No results for input(s): "AST", "ALT", "ALKPHOS", "BILITOT", "PROT", "ALBUMIN" in the last 168 hours. No results for input(s): "LIPASE", "AMYLASE" in the last 168 hours. No results for input(s): "AMMONIA" in the last 168 hours. CBC: Recent Labs  Lab 01/03/23 2105  WBC 12.6*  HGB 16.4  HCT 52.4*  MCV 93.9  PLT 381   Cardiac Enzymes: Recent Labs  Lab 01/04/23 1053  CKTOTAL 29*   BNP: Invalid input(s): "POCBNP" CBG: Recent Labs  Lab 01/05/23 2058  GLUCAP 131*   D-Dimer No results for input(s): "DDIMER" in the last 72 hours. Hgb A1c No results for input(s): "HGBA1C" in the last 72 hours. Lipid Profile No results for input(s): "CHOL", "HDL", "LDLCALC", "TRIG", "CHOLHDL", "LDLDIRECT" in the last 72 hours. Thyroid function studies No results for input(s): "TSH", "T4TOTAL", "T3FREE", "THYROIDAB" in the last 72 hours.  Invalid input(s): "FREET3" Anemia work up No results for input(s): "VITAMINB12", "FOLATE", "FERRITIN", "TIBC", "IRON", "RETICCTPCT" in the last 72 hours. Urinalysis    Component Value Date/Time   COLORURINE YELLOW (A)  01/03/2023 2105   APPEARANCEUR CLEAR (A) 01/03/2023 2105   LABSPEC 1.025 01/03/2023 2105   PHURINE 5.0 01/03/2023 2105   GLUCOSEU NEGATIVE 01/03/2023 2105   HGBUR NEGATIVE 01/03/2023 2105   BILIRUBINUR NEGATIVE 01/03/2023 2105   KETONESUR 20 (A) 01/03/2023 2105   PROTEINUR 30 (A) 01/03/2023 2105   NITRITE NEGATIVE 01/03/2023 2105   LEUKOCYTESUR NEGATIVE 01/03/2023 2105   Sepsis Labs Recent Labs  Lab 01/03/23 2105  WBC 12.6*   Microbiology Recent Results (from the past 240 hour(s))  Resp panel by RT-PCR (RSV, Flu A&B, Covid) Anterior Nasal Swab     Status: None   Collection Time: 01/03/23  9:05 PM   Specimen: Anterior Nasal Swab  Result Value Ref Range Status   SARS Coronavirus 2 by RT PCR NEGATIVE NEGATIVE Final    Comment: (NOTE) SARS-CoV-2 target nucleic acids are NOT DETECTED.  The SARS-CoV-2 RNA is generally detectable in upper respiratory specimens during the acute phase of infection. The lowest concentration of SARS-CoV-2 viral copies this assay can detect is 138 copies/mL. A negative result does not preclude SARS-Cov-2 infection and should not be used as the sole basis for treatment or other patient management decisions. A negative result may occur with  improper specimen collection/handling, submission of specimen other than nasopharyngeal swab, presence of viral mutation(s) within the areas targeted by this assay, and inadequate number of viral copies(<138 copies/mL). A negative result must be combined with clinical observations, patient history, and epidemiological information. The expected result is Negative.  Fact Sheet for Patients:  BloggerCourse.com  Fact Sheet for Healthcare Providers:  SeriousBroker.it  This test is no t yet approved or cleared by the [place] FDA and  has been authorized for detection and/or diagnosis of SARS-CoV-2 by FDA under an Emergency Use Authorization (EUA). This EUA will  remain  in effect (meaning this test can be used) for the duration of the COVID-19 declaration under Section 564(b)(1) of the Act, 21 U.S.C.section 360bbb-3(b)(1), unless the authorization is terminated  or revoked sooner.       Influenza A by PCR NEGATIVE NEGATIVE Final   Influenza B by PCR NEGATIVE NEGATIVE Final    Comment: (NOTE) The Xpert Xpress SARS-CoV-2/FLU/RSV plus assay is intended as an aid in the diagnosis of influenza from Nasopharyngeal swab specimens and should not be used as a sole basis for treatment. Nasal washings and aspirates are unacceptable for Xpert Xpress SARS-CoV-2/FLU/RSV testing.  Fact Sheet for Patients: BloggerCourse.com  Fact Sheet for Healthcare Providers: SeriousBroker.it  This test is not yet approved or cleared by the [place] FDA and has been authorized for detection and/or diagnosis of SARS-CoV-2 by FDA under an Emergency Use Authorization (EUA). This EUA will remain in effect (meaning this test can be used) for the duration of  the COVID-19 declaration under Section 564(b)(1) of the Act, 21 U.S.C. section 360bbb-3(b)(1), unless the authorization is terminated or revoked.     Resp Syncytial Virus by PCR NEGATIVE NEGATIVE Final    Comment: (NOTE) Fact Sheet for Patients: EntrepreneurPulse.com.au  Fact Sheet for Healthcare Providers: IncredibleEmployment.be  This test is not yet approved or cleared by the [place] FDA and has been authorized for detection and/or diagnosis of SARS-CoV-2 by FDA under an Emergency Use Authorization (EUA). This EUA will remain in effect (meaning this test can be used) for the duration of the COVID-19 declaration under Section 564(b)(1) of the Act, 21 U.S.C. section 360bbb-3(b)(1), unless the authorization is terminated or revoked.  Performed at [facility], [address] [place], [place] [address]    Blood culture (routine x 2)     Status: None (Preliminary result)   Collection Time: 01/04/23 12:50 AM   Specimen: BLOOD  Result Value Ref Range Status   Specimen Description BLOOD RIGTH FA  Final   Special Requests   Final    BOTTLES DRAWN AEROBIC AND ANAEROBIC Blood Culture adequate volume   Culture   Final    NO GROWTH 3 DAYS Performed at [facility], [address] [place], [place] [address]    Report Status PENDING  Incomplete  Blood culture (routine x 2)     Status: None (Preliminary result)   Collection Time: 01/04/23 12:50 AM   Specimen: BLOOD  Result Value Ref Range Status   Specimen Description BLOOD RIGHT [facility]  Final   Special Requests   Final    BOTTLES DRAWN AEROBIC AND ANAEROBIC Blood Culture adequate volume   Culture   Final    NO GROWTH 3 DAYS Performed at [facility], [address], [place], [place] [address]    Report Status PENDING  Incomplete  Expectorated Sputum Assessment w Gram Stain, Rflx to Resp Cult     Status: None   Collection Time: 01/04/23 10:27 AM   Specimen: Expectorated Sputum  Result Value Ref Range Status   Specimen Description EXPECTORATED SPUTUM  Final   Special Requests NONE  Final   Sputum evaluation   Final    Sputum specimen not acceptable for testing.  Please recollect.   [place] [address] 01/04/23.PMF Performed at [facility], [address] [place], [place] [address]    Report Status 01/04/2023 FINAL  Final     Total time spend on discharging this patient, including the last patient exam, discussing the hospital stay, instructions for ongoing care as it relates to all pertinent caregivers, as well as preparing the medical discharge records, prescriptions, and/or referrals as applicable, is 35 minutes.    Enzo Bi, MD  [facility] 01/07/2023, 1:44 PM

## 2023-01-07 NOTE — TOC Progression Note (Signed)
Transition of Care (TOC) - Progression Note    Patient Details  Name: Corey House. MRN: [id number] Date of Birth: 08-Oct-1966  Transition of Care [facility]) CM/SW [place], LCSW Phone Number: 01/07/2023, 10:46 AM  Clinical Narrative:  Pharmacy is following for potential discharge medication needs.   Expected Discharge Plan: Home/Self Care Barriers to Discharge: Continued Medical Work up  Expected Discharge Plan and [place] arrangements for the past 2 months: [place]                 DME Arranged: Shower stool DME Agency: [employer] Date DME Agency Contacted: 01/05/23   Representative spoke with at DME Agency: Delana Meyer             Social Determinants of Health ([place]) Interventions SDOH Screenings   Food Insecurity: Food Insecurity Present (01/04/2023)  Housing: Low Risk  (01/04/2023)  Transportation Needs: No Transportation Needs (01/04/2023)  Utilities: Not At Risk (01/04/2023)  Tobacco Use: High Risk (01/03/2023)    Readmission Risk Interventions     No data to display

## 2023-01-07 NOTE — TOC Transition Note (Signed)
Transition of Care [facility] - CM/SW Discharge Note   Patient Details  Name: Corey House. MRN: [id number] Date of Birth: 1966-08-21  Transition of Care [facility]) CM/SW Contact:  Candie Chroman, LCSW Phone Number: 01/07/2023, 12:42 PM   Clinical Narrative:  Patient has orders to discharge home today. No further concerns. [name] signing off.   Final next level of care: Home/Self Care Barriers to Discharge: Barriers Resolved   Patient Goals and CMS Choice CMS Medicare.gov Compare Post Acute Care list provided to:: Patient Choice offered to / list presented to : Patient  Discharge Placement                      Patient and family notified of of transfer: 01/07/23  Discharge Plan and Services Additional resources added to the After Visit Summary for                  DME Arranged: Shower stool DME Agency: [employer] Date DME Agency Contacted: 01/05/23   Representative spoke with at DME Agency: [place] ([place]) Interventions [place]: Food Insecurity Present (01/04/2023)  Housing: Low Risk  (01/04/2023)  Transportation Needs: No Transportation Needs (01/04/2023)  Utilities: Not At Risk (01/04/2023)  Tobacco Use: High Risk (01/03/2023)     Readmission Risk Interventions     No data to display

## 2023-01-09 LAB — CULTURE, BLOOD (ROUTINE X 2)
Culture: NO GROWTH
Culture: NO GROWTH
Special Requests: ADEQUATE
Special Requests: ADEQUATE

## 2023-12-27 ENCOUNTER — Inpatient Hospital Stay
Admission: RE | Admit: 2023-12-27 | Discharge: 2024-01-07 | DRG: 885 | Disposition: A | Discharge status: Home / Self Care | Payer: Medicaid Other | Source: Intra-hospital | Attending: Psychiatry | Admitting: Psychiatry

## 2023-12-27 ENCOUNTER — Emergency Department (HOSPITAL_COMMUNITY)
Admission: EM | Admit: 2023-12-27 | Discharge: 2023-12-27 | Disposition: A | Discharge status: Other Acute Inpatient Hospital | Payer: Medicaid Other | Source: Home / Self Care | Attending: Emergency Medicine | Admitting: Emergency Medicine

## 2023-12-27 ENCOUNTER — Other Ambulatory Visit: Payer: Self-pay

## 2023-12-27 ENCOUNTER — Encounter: Payer: Self-pay | Admitting: Psychiatry

## 2023-12-27 DIAGNOSIS — Z9841 Cataract extraction status, right eye: Secondary | ICD-10-CM

## 2023-12-27 DIAGNOSIS — F329 Major depressive disorder, single episode, unspecified: Secondary | ICD-10-CM | POA: Insufficient documentation

## 2023-12-27 DIAGNOSIS — Y92239 Unspecified place in hospital as the place of occurrence of the external cause: Secondary | ICD-10-CM | POA: Diagnosis not present

## 2023-12-27 DIAGNOSIS — G4700 Insomnia, unspecified: Secondary | ICD-10-CM | POA: Diagnosis present

## 2023-12-27 DIAGNOSIS — F22 Delusional disorders: Secondary | ICD-10-CM | POA: Diagnosis present

## 2023-12-27 DIAGNOSIS — E519 Thiamine deficiency, unspecified: Secondary | ICD-10-CM | POA: Diagnosis present

## 2023-12-27 DIAGNOSIS — F331 Major depressive disorder, recurrent, moderate: Secondary | ICD-10-CM | POA: Diagnosis present

## 2023-12-27 DIAGNOSIS — Z79899 Other long term (current) drug therapy: Secondary | ICD-10-CM

## 2023-12-27 DIAGNOSIS — F17210 Nicotine dependence, cigarettes, uncomplicated: Secondary | ICD-10-CM | POA: Diagnosis present

## 2023-12-27 DIAGNOSIS — F1010 Alcohol abuse, uncomplicated: Secondary | ICD-10-CM | POA: Diagnosis present

## 2023-12-27 DIAGNOSIS — Z91148 Patient's other noncompliance with medication regimen for other reason: Secondary | ICD-10-CM | POA: Diagnosis not present

## 2023-12-27 DIAGNOSIS — F332 Major depressive disorder, recurrent severe without psychotic features: Secondary | ICD-10-CM

## 2023-12-27 DIAGNOSIS — H532 Diplopia: Secondary | ICD-10-CM | POA: Diagnosis not present

## 2023-12-27 DIAGNOSIS — R45851 Suicidal ideations: Secondary | ICD-10-CM | POA: Insufficient documentation

## 2023-12-27 DIAGNOSIS — F419 Anxiety disorder, unspecified: Secondary | ICD-10-CM | POA: Diagnosis present

## 2023-12-27 DIAGNOSIS — J441 Chronic obstructive pulmonary disease with (acute) exacerbation: Secondary | ICD-10-CM | POA: Diagnosis present

## 2023-12-27 DIAGNOSIS — Z7982 Long term (current) use of aspirin: Secondary | ICD-10-CM

## 2023-12-27 DIAGNOSIS — Z5941 Food insecurity: Secondary | ICD-10-CM | POA: Diagnosis not present

## 2023-12-27 DIAGNOSIS — Z560 Unemployment, unspecified: Secondary | ICD-10-CM | POA: Diagnosis not present

## 2023-12-27 DIAGNOSIS — Z604 Social exclusion and rejection: Secondary | ICD-10-CM | POA: Diagnosis present

## 2023-12-27 DIAGNOSIS — Z87442 Personal history of urinary calculi: Secondary | ICD-10-CM

## 2023-12-27 DIAGNOSIS — Z961 Presence of intraocular lens: Secondary | ICD-10-CM | POA: Diagnosis present

## 2023-12-27 DIAGNOSIS — Z5971 Insufficient health insurance coverage: Secondary | ICD-10-CM

## 2023-12-27 DIAGNOSIS — F1193 Opioid use, unspecified with withdrawal: Secondary | ICD-10-CM | POA: Insufficient documentation

## 2023-12-27 DIAGNOSIS — G2581 Restless legs syndrome: Secondary | ICD-10-CM | POA: Diagnosis present

## 2023-12-27 DIAGNOSIS — F1123 Opioid dependence with withdrawal: Secondary | ICD-10-CM | POA: Diagnosis present

## 2023-12-27 DIAGNOSIS — Z5986 Financial insecurity: Secondary | ICD-10-CM

## 2023-12-27 DIAGNOSIS — J449 Chronic obstructive pulmonary disease, unspecified: Secondary | ICD-10-CM | POA: Insufficient documentation

## 2023-12-27 DIAGNOSIS — T43225A Adverse effect of selective serotonin reuptake inhibitors, initial encounter: Secondary | ICD-10-CM | POA: Diagnosis not present

## 2023-12-27 DIAGNOSIS — F1310 Sedative, hypnotic or anxiolytic abuse, uncomplicated: Secondary | ICD-10-CM | POA: Diagnosis present

## 2023-12-27 DIAGNOSIS — Z9842 Cataract extraction status, left eye: Secondary | ICD-10-CM

## 2023-12-27 DIAGNOSIS — F32A Depression, unspecified: Secondary | ICD-10-CM

## 2023-12-27 LAB — URINE DRUG SCREEN, QUALITATIVE (ARMC ONLY)
Amphetamines, Ur Screen: NOT DETECTED
Barbiturates, Ur Screen: NOT DETECTED
Benzodiazepine, Ur Scrn: POSITIVE — AB
Cannabinoid 50 Ng, Ur ~~LOC~~: NOT DETECTED
Cocaine Metabolite,Ur ~~LOC~~: NOT DETECTED
MDMA (Ecstasy)Ur Screen: NOT DETECTED
Methadone Scn, Ur: NOT DETECTED
Opiate, Ur Screen: NOT DETECTED
Phencyclidine (PCP) Ur S: NOT DETECTED
Tricyclic, Ur Screen: NOT DETECTED

## 2023-12-27 LAB — CBC
HCT: 50.4 % (ref 39.0–52.0)
Hemoglobin: 17 g/dL (ref 13.0–17.0)
MCH: 30.9 pg (ref 26.0–34.0)
MCHC: 33.7 g/dL (ref 30.0–36.0)
MCV: 91.5 fL (ref 80.0–100.0)
Platelets: 305 10*3/uL (ref 150–400)
RBC: 5.51 MIL/uL (ref 4.22–5.81)
RDW: 14.2 % (ref 11.5–15.5)
WBC: 3.4 10*3/uL — ABNORMAL LOW (ref 4.0–10.5)
nRBC: 0 % (ref 0.0–0.2)

## 2023-12-27 LAB — TROPONIN I (HIGH SENSITIVITY): Troponin I (High Sensitivity): 4 ng/L (ref <18)

## 2023-12-27 LAB — COMPREHENSIVE METABOLIC PANEL
ALT: 12 U/L (ref 0–44)
AST: 20 U/L (ref 15–41)
Albumin: 3.9 g/dL (ref 3.5–5.0)
Alkaline Phosphatase: 88 U/L (ref 38–126)
Anion gap: 11 (ref 5–15)
BUN: 13 mg/dL (ref 6–20)
CO2: 28 mmol/L (ref 22–32)
Calcium: 8.9 mg/dL (ref 8.9–10.3)
Chloride: 100 mmol/L (ref 98–111)
Creatinine, Ser: 0.9 mg/dL (ref 0.61–1.24)
GFR, Estimated: >60 mL/min (ref >60)
Glucose, Bld: 107 mg/dL — ABNORMAL HIGH (ref 70–99)
Potassium: 3.6 mmol/L (ref 3.5–5.1)
Sodium: 139 mmol/L (ref 135–145)
Total Bilirubin: 0.6 mg/dL (ref 0.0–1.2)
Total Protein: 6.6 g/dL (ref 6.5–8.1)

## 2023-12-27 LAB — SALICYLATE LEVEL: Salicylate Lvl: <7 mg/dL — ABNORMAL LOW (ref 7.0–30.0)

## 2023-12-27 LAB — ACETAMINOPHEN LEVEL: Acetaminophen (Tylenol), Serum: <10 ug/mL — ABNORMAL LOW (ref 10–30)

## 2023-12-27 LAB — ETHANOL: Alcohol, Ethyl (B): <10 mg/dL (ref <10)

## 2023-12-27 MED ORDER — ACETAMINOPHEN 325 MG PO TABS
650.0000 mg | ORAL_TABLET | Freq: Once | ORAL | Status: AC
  Administered 2023-12-27: 650 mg via ORAL
  Filled 2023-12-27: qty 2

## 2023-12-27 MED ORDER — HYDROXYZINE HCL 25 MG PO TABS
25.0000 mg | ORAL_TABLET | Freq: Three times a day (TID) | ORAL | Status: DC | PRN
  Administered 2023-12-27 – 2024-01-02 (×5): 25 mg via ORAL
  Filled 2023-12-27 (×6): qty 1

## 2023-12-27 MED ORDER — TRAZODONE HCL 50 MG PO TABS
50.0000 mg | ORAL_TABLET | Freq: Every evening | ORAL | Status: DC | PRN
  Administered 2023-12-27 – 2023-12-31 (×5): 50 mg via ORAL
  Filled 2023-12-27 (×5): qty 1

## 2023-12-27 MED ORDER — ACETAMINOPHEN 325 MG PO TABS
650.0000 mg | ORAL_TABLET | Freq: Four times a day (QID) | ORAL | Status: DC | PRN
  Administered 2023-12-29 – 2024-01-07 (×11): 650 mg via ORAL
  Filled 2023-12-27 (×12): qty 2

## 2023-12-27 MED ORDER — ALUM & MAG HYDROXIDE-SIMETH 200-200-20 MG/5ML PO SUSP: 30.0000 mL | ORAL | Status: DC | PRN

## 2023-12-27 MED ORDER — HALOPERIDOL 5 MG PO TABS
5.0000 mg | ORAL_TABLET | Freq: Three times a day (TID) | ORAL | Status: DC | PRN
  Administered 2024-01-03: 5 mg via ORAL
  Filled 2023-12-27: qty 1

## 2023-12-27 MED ORDER — DIPHENHYDRAMINE HCL 25 MG PO CAPS
50.0000 mg | ORAL_CAPSULE | Freq: Three times a day (TID) | ORAL | Status: DC | PRN
  Administered 2024-01-03: 50 mg via ORAL
  Filled 2023-12-27: qty 2

## 2023-12-27 MED ORDER — MAGNESIUM HYDROXIDE 400 MG/5ML PO SUSP: 30.0000 mL | Freq: Every day | ORAL | Status: DC | PRN

## 2023-12-27 MED ORDER — BUPRENORPHINE HCL-NALOXONE HCL 2-0.5 MG SL SUBL
2.0000 | SUBLINGUAL_TABLET | Freq: Once | SUBLINGUAL | Status: AC
  Administered 2023-12-27: 2 via SUBLINGUAL
  Filled 2023-12-27: qty 2

## 2023-12-27 MED ORDER — BUPRENORPHINE HCL-NALOXONE HCL 2-0.5 MG SL SUBL
2.0000 | SUBLINGUAL_TABLET | Freq: Once | SUBLINGUAL | Status: AC
  Administered 2023-12-27: 2 via SUBLINGUAL
  Filled 2023-12-27 (×2): qty 2

## 2023-12-27 NOTE — ED Provider Notes (Signed)
 [facility] Provider Note    Event Date/Time   First MD Initiated Contact with Patient 12/27/23 [phone]     (approximate)   History   Psychiatric Evaluation   HPI  Corey House. is a 58 y.o. male past medical history significant for COPD, depression, opioid use disorder, who presents to the emergency department with suicidal ideation.  Suicidal thoughts for the past 1 month but states he has been getting worse over the past couple of days.  Does not have a plan.  States that he is also having withdraws from opioids.  States that he has been on Suboxone  but lost his job 1 month ago and has been unable to get insurance to get his Suboxone .  Intermittently getting Suboxone  but is not have a regularly prescribed Suboxone  anymore.  States that he takes 1 less than one 8 mg tablet a day.  States he has intermittent episodes of severe paranoia and stress.  Feeling helpless.  Denies any alcohol  use.     Physical Exam   Triage Vital Signs: ED Triage Vitals  Encounter Vitals Group     BP 12/27/23 0439 (!) 139/110     Systolic BP Percentile --      Diastolic BP Percentile --      Pulse Rate 12/27/23 0439 (!) 101     Resp 12/27/23 0439 18     Temp 12/27/23 0439 98.3 F (36.8 C)     Temp Source 12/27/23 0439 Oral     SpO2 12/27/23 0439 95 %     Weight --      Height --      Head Circumference --      Peak Flow --      Pain Score 12/27/23 0436 0     Pain Loc --      Pain Education --      Exclude from Growth Chart --     Most recent vital signs: Vitals:   12/27/23 0439  BP: (!) 139/110  Pulse: (!) 101  Resp: 18  Temp: 98.3 F (36.8 C)  SpO2: 95%    Physical Exam Constitutional:      Appearance: He is well-developed.  HENT:     Head: Atraumatic.  Eyes:     Conjunctiva/sclera: Conjunctivae normal.  Cardiovascular:     Rate and Rhythm: Regular rhythm.  Pulmonary:     Effort: No respiratory distress.  Musculoskeletal:     Cervical back:  Normal range of motion.  Skin:    General: Skin is warm.  Neurological:     Mental Status: He is alert. Mental status is at baseline.  Psychiatric:        Mood and Affect: Mood is anxious and depressed. Affect is tearful.        Speech: Speech normal.        Behavior: Behavior is cooperative.        Thought Content: Thought content includes suicidal ideation.     IMPRESSION / MDM / ASSESSMENT AND PLAN / ED COURSE  I reviewed the triage vital signs and the nursing notes.  Differential diagnosis including depression, suicidality, electrolyte abnormality, withdrawal from Suboxone , secondary gain  EKG  I, Clotilda Punter, the attending physician, personally viewed and interpreted this ECG.   Rate: Normal  Rhythm: Normal sinus  Axis: Normal  Intervals: Normal  ST&T Change: None     LABS (all labs ordered are listed, but only abnormal results are displayed) Labs interpreted as -  Labs Reviewed  COMPREHENSIVE METABOLIC PANEL - Abnormal; Notable for the following components:      Result Value   Glucose, Bld 107 (*)    All other components within normal limits  CBC - Abnormal; Notable for the following components:   WBC 3.4 (*)    All other components within normal limits  URINE DRUG SCREEN, QUALITATIVE ([facility] ONLY) - Abnormal; Notable for the following components:   Benzodiazepine, Ur Scrn POSITIVE (*)    All other components within normal limits  ACETAMINOPHEN  LEVEL  ETHANOL  SALICYLATE LEVEL  TROPONIN I (HIGH SENSITIVITY)  TROPONIN I (HIGH SENSITIVITY)    MDM  Lab work are overall reassuring.  UDS positive for benzodiazepine.  Alcohol  level negative.  Troponin negative.  Patient medically clear.  Given 1 dose of 4 mg of Suboxone .  Patient here voluntarily.  Plan to evaluate with psychiatry.     The patient has been placed in psychiatric observation due to the need to provide a safe environment for the patient while obtaining psychiatric consultation and  evaluation, as well as ongoing medical and medication management to treat the patient's condition.  The patient has not been placed under full IVC at this time.   PROCEDURES:  Critical Care performed: No  Procedures  Patient's presentation is most consistent with acute presentation with potential threat to life or bodily function.   MEDICATIONS ORDERED IN ED: Medications  buprenorphine -naloxone  (SUBOXONE ) 2-0.5 mg per SL tablet 2 tablet (has no administration in time range)    FINAL CLINICAL IMPRESSION(S) / ED DIAGNOSES   Final diagnoses:  Depression, unspecified depression type  Suicidal ideation  Opioid withdrawal (HCC)     Rx / DC Orders   ED Discharge Orders     None        Note:  This document was prepared using Dragon voice recognition software and may include unintentional dictation errors.   Suzanne Kirsch, MD 12/27/23 [phone]

## 2023-12-27 NOTE — ED Notes (Signed)
 Hospital meal provided.  100% consumed, pt tolerated w/o complaints.  Waste discarded appropriately.

## 2023-12-27 NOTE — BH Assessment (Signed)
 Comprehensive Clinical Assessment (CCA) Note  12/27/2023 Corey House [id number]  Chief Complaint:  Chief Complaint  Patient presents with   Psychiatric Evaluation   Visit Diagnosis: Major Depression   Corey House. [name] is a 59 year old male who presents to the ER due to having thoughts of ending his life. He reports of having the plan of hanging his self. Patient further reports, he no longer has the desire or will to live. He isn't sleeping well, no more than two hours a day. He's had an increase in anxiety and is fearful for "for no reason." I he states, "It's like I'm scared all the time, for no reason at that. I'm scared to live the house." This started approximately two months ago. During the interview, the patient was calm, cooperative and pleasant. He was able to provide appropriate answers to the questions. He denies HI and AV/H.  CCA Screening, Triage and Referral (STR)  Patient Reported Information How did you hear about us ? No data recorded What Is the Reason for Your Visit/Call Today? Having thoughts of ending his life. States he has gave up on life.  How Long Has This Been Causing You Problems? 1 wk - 1 month  What Do You Feel Would Help You the Most Today? Treatment for Depression or other mood problem   Have You Recently Had Any Thoughts About Hurting Yourself? Yes  Are You Planning to Commit Suicide/Harm Yourself At This time? Yes   Flowsheet Row ED from 12/27/2023 in [facility] Emergency Department at [facility] ED to Hosp-Admission (Discharged) from 01/03/2023 in [facility] Admission (Discharged) from 08/09/2021 in [facility] PERIOP  C-SSRS RISK CATEGORY High Risk Error: Q3, 4, or 5 should not be populated when Q2 is No No Risk       Have you Recently Had Thoughts About Hurting Someone [place]? No  Are You Planning to Harm Someone at This Time? No  Explanation: No data  recorded  Have You Used Any Alcohol  or Drugs in the Past 24 Hours? No data recorded What Did You Use and How Much? No data recorded  Do You Currently Have a Therapist/Psychiatrist? No data recorded Name of Therapist/Psychiatrist:    Have You Been Recently Discharged From Any Office Practice or Programs? No  Explanation of Discharge From Practice/Program: No data recorded    CCA Screening Triage Referral Assessment Type of Contact: Face-to-Face  Telemedicine Service Delivery:   Is this Initial or Reassessment?   Date Telepsych consult ordered in [facility]:    Time Telepsych consult ordered in [facility]:    Location of Assessment: [facility] ED  Provider Location: [facility] ED   Collateral Involvement: No data recorded  Does Patient Have a Court Appointed Legal Guardian? No  Legal Guardian Contact Information: No data recorded Copy of Legal Guardianship Form: No data recorded Legal Guardian Notified of Arrival: No data recorded Legal Guardian Notified of Pending Discharge: No data recorded If Minor and Not Living with Parent(s), Who has Custody? No data recorded Is CPS involved or ever been involved? Never  Is APS involved or ever been involved? Never   Patient Determined To Be At Risk for Harm To Self or Others Based on Review of Patient Reported Information or Presenting Complaint? Yes, for Self-Harm  Method: No Plan  Availability of Means: No data recorded Intent: No data recorded Notification Required: No data recorded Additional Information for Danger to Others Potential: No data recorded Additional Comments  for Danger to Others Potential: No data recorded Are There Guns or Other Weapons in Your Home? No  Types of Guns/Weapons: No data recorded Are These Weapons Safely Secured?                            No  Who Could Verify You Are Able To Have These Secured: No data recorded Do You Have any Outstanding Charges, Pending Court Dates, Parole/Probation? No data recorded Contacted To  Inform of Risk of Harm To Self or Others: No data recorded   Does Patient Present under Involuntary Commitment? No    [place] of Residence: [place]   Patient Currently Receiving the Following Services: Not Receiving Services   Determination of Need: Emergent (2 hours)   Options For Referral: ED Referral     CCA Biopsychosocial Patient Reported Schizophrenia/Schizoaffective Diagnosis in Past: No   Strengths: Have some insight, able to take care of his ADL's and seeking help on his own.   Mental Health Symptoms Depression:  Change in energy/activity; Difficulty Concentrating; Fatigue; Hopelessness; Increase/decrease in appetite; Sleep (too much or little); Tearfulness; Worthlessness   Duration of Depressive symptoms: Duration of Depressive Symptoms: Greater than two weeks   Mania:  None   Anxiety:   Restlessness; Worrying   Psychosis:  None   Duration of Psychotic symptoms:    Trauma:  N/A   Obsessions:  No data recorded  Compulsions:  N/A   Inattention:  N/A   Hyperactivity/Impulsivity:  N/A   Oppositional/Defiant Behaviors:  No data recorded  Emotional Irregularity:  N/A   Other Mood/Personality Symptoms:  No data recorded   Mental Status Exam Appearance and self-care  Stature:  Average   Weight:  Average weight   Clothing:  No data recorded  Grooming:  Normal   Cosmetic use:  None   Posture/gait:  Normal   Motor activity:  -- (within normal range)   Sensorium  Attention:  Normal   Concentration:  Normal   Orientation:  X5   Recall/memory:  Normal   Affect and Mood  Affect:  Appropriate; Depressed   Mood:  Depressed   Relating  Eye contact:  Normal   Facial expression:  Depressed; Responsive   Attitude toward examiner:  Cooperative   Thought and Language  Speech flow: Clear and Coherent; Normal   Thought content:  Appropriate to Mood and Circumstances   Preoccupation:  Ruminations   Hallucinations:  None    Organization:  Coherent; Intact; Logical   Company Secretary of Knowledge:  Fair   Intelligence:  Average   Abstraction:  Functional   Judgement:  Poor   Reality Testing:  Realistic   Insight:  Fair   Decision Making:  Paralyzed   Social Functioning  Social Maturity:  Isolates   Social Judgement:  Normal   Stress  Stressors:  Surveyor, Quantity; Housing; Other (Comment)   Coping Ability:  Overwhelmed   Skill Deficits:  None   Supports:  Family     Religion: Religion/Spirituality Are You A Religious Person?: No  Leisure/Recreation: Leisure / Recreation Do You Have Hobbies?: No  Exercise/Diet: Exercise/Diet Do You Exercise?: No Have You Gained or Lost A Significant Amount of Weight in the Past Six Months?: No Do You Follow a Special Diet?: No Do You Have Any Trouble Sleeping?: Yes Explanation of Sleeping Difficulties: Trouble falling and staying asleep.   CCA Employment/Education Employment/Work Situation: Employment / Work Situation Employment Situation: Unemployed Patient's Job has  Been Impacted by Current Illness: No  Education: Education Is Patient Currently Attending School?: No Did You Attend College?: No Did You Have An Individualized Education Program (IIEP): No Did You Have Any Difficulty At School?: No Patient's Education Has Been Impacted by Current Illness: No   CCA Family/Childhood History Family and Relationship History: Family history Marital status: Single Does patient have children?: No  Childhood History:  Childhood History By whom was/is the patient raised?: Mother Did patient suffer any verbal/emotional/physical/sexual abuse as a child?: No Did patient suffer from severe childhood neglect?: No Has patient ever been sexually abused/assaulted/raped as an adolescent or adult?: No Was the patient ever a victim of a crime or a disaster?: No Witnessed domestic violence?: No Has patient been affected by domestic violence as an  adult?: No  CCA Substance Use Alcohol /Drug Use: Alcohol  / Drug Use Pain Medications: See MAR Prescriptions: See MAR Over the Counter: See MAR History of alcohol  / drug use?: No history of alcohol  / drug abuse Longest period of sobriety (when/how long): n/a     ASAM's:  Six Dimensions of Multidimensional Assessment  Dimension 1:  Acute Intoxication and/or Withdrawal Potential:      Dimension 2:  Biomedical Conditions and Complications:      Dimension 3:  Emotional, Behavioral, or Cognitive Conditions and Complications:     Dimension 4:  Readiness to Change:     Dimension 5:  Relapse, Continued use, or Continued Problem Potential:     Dimension 6:  Recovery/Living Environment:     ASAM Severity Score:    ASAM Recommended Level of Treatment:     Substance use Disorder (SUD)    Recommendations for Services/Supports/Treatments:    Disposition Recommendation per psychiatric provider: Pending Psych Consult   DSM5 Diagnoses: Patient Active Problem List   Diagnosis Date Noted   Major depressive disorder, recurrent episode, moderate (HCC) 01/04/2023   Pressure injury of skin 01/04/2023   Protein-calorie malnutrition, severe 01/04/2023   COPD with acute exacerbation (HCC) 01/03/2023   Tobacco abuse 01/03/2023   Acute respiratory failure with hypoxia (HCC) 01/03/2023   Cough 01/03/2023     Referrals to Alternative Service(s): Referred to Alternative Service(s):   Place:   Date:   Time:    Referred to Alternative Service(s):   Place:   Date:   Time:    Referred to Alternative Service(s):   Place:   Date:   Time:    Referred to Alternative Service(s):   Place:   Date:   Time:     Kiki DOROTHA Barge MS, LCAS, [facility], [facility] Therapeutic Triage Specialist 12/27/2023 2:11 PM

## 2023-12-27 NOTE — ED Notes (Signed)
 Pt provided with lunch tray.

## 2023-12-27 NOTE — ED Triage Notes (Signed)
 Pt to ED via [facility]PD voluntarily for psych eval. Pt reports SI for about a month. Says he doesn't want to be here anymore and feels scared of everything. Is prescribed medication for depression but hasn't been taking them. Pt also states he has been taking suboxone  for 10 years and stopped taking it 3 days ago because he ran out. Pt also endorses SHOB for a few days, says he has COPD

## 2023-12-27 NOTE — ED Notes (Signed)
 Transferred to BHU by wheelchair with EDT and security. 2 bags of belongings sent with to be placed in BHU locker room by tech Alexia

## 2023-12-27 NOTE — ED Notes (Signed)
 Pt dresed out into hospital scrubs by this RN and [place] NT. Belongings placed in belongings bag Wallace Cullens sweater 2 black t shirts Wallace Cullens sweatpants black pair of shoes 2 pairs of Gray socks Black/white shorts White underwear

## 2023-12-27 NOTE — Progress Notes (Signed)
 Charted in error.

## 2023-12-27 NOTE — ED Notes (Signed)
 Patient transferred to the BHU from the quad, He is alert and oriented, staff oriented him to the unit, nurse talked with the Patient and He states  I can't take being here, I just want to die, I started suboxin like 10 years ago and I wish I never started it, I want to get off of it, but I can't. He complained about aching all over and how the pain was unbearable and that He felt like He would do anything to get his fix of the suboxone  . Nurse listened and showed empathy ,let him know that she would keep him updated on the next step that Doctor would recommend for treatment.

## 2023-12-27 NOTE — Tx Team (Signed)
 Initial Treatment Plan 12/27/2023 11:24 PM Corey House. [id number]    PATIENT STRESSORS: Health problems   Substance abuse     PATIENT STRENGTHS: Motivation for treatment/growth    PATIENT IDENTIFIED PROBLEMS: Medication management  Sucidal Ideation                   DISCHARGE CRITERIA:  Ability to meet basic life and health needs Motivation to continue treatment in a less acute level of care  PRELIMINARY DISCHARGE PLAN: Attend aftercare/continuing care group  PATIENT/FAMILY INVOLVEMENT: This treatment plan has been presented to and reviewed with the patient, Corey House. The patient and family have been given the opportunity to ask questions and make suggestions.  Tonna DELENA Home, RN 12/27/2023, 11:24 PM

## 2023-12-27 NOTE — ED Notes (Signed)
 Requesting tylenol for headache. EDP S Siadecki informed

## 2023-12-27 NOTE — Plan of Care (Signed)
 New Admission  Problem: Education: Goal: Knowledge of  General Education information/materials will improve Outcome: Not Progressing Goal: Emotional status will improve Outcome: Not Progressing   Problem: Activity: Goal: Interest or engagement in activities will improve Outcome: Not Progressing Goal: Sleeping patterns will improve Outcome: Not Progressing

## 2023-12-28 ENCOUNTER — Inpatient Hospital Stay: Payer: Medicaid Other

## 2023-12-28 ENCOUNTER — Encounter: Payer: Self-pay | Admitting: Psychiatry

## 2023-12-28 DIAGNOSIS — F331 Major depressive disorder, recurrent, moderate: Secondary | ICD-10-CM

## 2023-12-28 DIAGNOSIS — F32A Depression, unspecified: Secondary | ICD-10-CM

## 2023-12-28 DIAGNOSIS — F1310 Sedative, hypnotic or anxiolytic abuse, uncomplicated: Secondary | ICD-10-CM | POA: Insufficient documentation

## 2023-12-28 DIAGNOSIS — R45851 Suicidal ideations: Secondary | ICD-10-CM | POA: Insufficient documentation

## 2023-12-28 MED ORDER — LORAZEPAM 2 MG PO TABS
0.0000 mg | ORAL_TABLET | Freq: Two times a day (BID) | ORAL | Status: DC
  Administered 2023-12-30: 1 mg via ORAL
  Administered 2023-12-31 (×2): 2 mg via ORAL
  Filled 2023-12-28 (×2): qty 1

## 2023-12-28 MED ORDER — THIAMINE MONONITRATE 100 MG PO TABS
100.0000 mg | ORAL_TABLET | Freq: Every day | ORAL | Status: DC
  Administered 2023-12-28 – 2024-01-07 (×11): 100 mg via ORAL
  Filled 2023-12-28 (×11): qty 1

## 2023-12-28 MED ORDER — MELATONIN 5 MG PO TABS
5.0000 mg | ORAL_TABLET | Freq: Every day | ORAL | Status: DC
  Administered 2023-12-28 – 2024-01-06 (×10): 5 mg via ORAL
  Filled 2023-12-28 (×10): qty 1

## 2023-12-28 MED ORDER — LORAZEPAM 2 MG PO TABS
0.0000 mg | ORAL_TABLET | Freq: Four times a day (QID) | ORAL | Status: DC
  Administered 2023-12-28 – 2023-12-29 (×3): 2 mg via ORAL
  Administered 2023-12-29: 1 mg via ORAL
  Administered 2023-12-29 – 2023-12-30 (×2): 2 mg via ORAL
  Filled 2023-12-28 (×6): qty 1

## 2023-12-28 MED ORDER — ESCITALOPRAM OXALATE 10 MG PO TABS: 10.0000 mg | ORAL_TABLET | Freq: Every day | ORAL | Status: DC

## 2023-12-28 MED ORDER — ENSURE ENLIVE PO LIQD
237.0000 mL | Freq: Two times a day (BID) | ORAL | Status: DC
  Administered 2023-12-28 – 2024-01-03 (×10): 237 mL via ORAL

## 2023-12-28 MED ORDER — LORAZEPAM 1 MG PO TABS
1.0000 mg | ORAL_TABLET | ORAL | Status: DC | PRN
  Administered 2023-12-30: 1 mg via ORAL
  Filled 2023-12-28: qty 1
  Filled 2023-12-28: qty 2

## 2023-12-28 MED ORDER — ADULT MULTIVITAMIN W/MINERALS CH
1.0000 | ORAL_TABLET | Freq: Every day | ORAL | Status: DC
  Administered 2023-12-28 – 2024-01-07 (×11): 1 via ORAL
  Filled 2023-12-28 (×11): qty 1

## 2023-12-28 MED ORDER — THIAMINE HCL 100 MG/ML IJ SOLN: 100.0000 mg | Freq: Every day | INTRAMUSCULAR | Status: DC

## 2023-12-28 MED ORDER — LORAZEPAM 2 MG/ML IJ SOLN: 1.0000 mg | INTRAMUSCULAR | Status: DC | PRN

## 2023-12-28 MED ORDER — FOLIC ACID 1 MG PO TABS
1.0000 mg | ORAL_TABLET | Freq: Every day | ORAL | Status: DC
  Administered 2023-12-28 – 2024-01-07 (×11): 1 mg via ORAL
  Filled 2023-12-28 (×11): qty 1

## 2023-12-28 MED ORDER — ESCITALOPRAM OXALATE 10 MG PO TABS
20.0000 mg | ORAL_TABLET | Freq: Every day | ORAL | Status: DC
  Administered 2023-12-28 – 2024-01-07 (×11): 20 mg via ORAL
  Filled 2023-12-28 (×11): qty 2

## 2023-12-28 NOTE — H&P (Signed)
 Psychiatric Admission Assessment Adult  Patient Identification: Corey House. MRN:  [id number] Date of Evaluation:  12/28/2023 Chief Complaint:  MDD (major depressive disorder) [F32.9] Principal Diagnosis: Major depressive disorder, recurrent episode, moderate (HCC) Diagnosis:  Principal Problem:   Major depressive disorder, recurrent episode, moderate (HCC) Active Problems:   Benzodiazepine abuse (HCC)   Suicidal ideations  History of Present Illness:  58 year old Caucasian male, resents with suicidal ideation (SI) and a plan to hang himself, reporting worsening symptoms of depression over the past month. He describes feeling increasingly hopeless and helpless, with escalation of symptoms in the past few days.uicidal ideation: Persistent for the past month, worsening over the last few days. Depressive symptoms: Ongoing for several months, exacerbated by job loss one month ago. Reports pervasive feelings of sadness, hopelessness, and helplessness.Severe insomnia, stating, I can't sleep at all. Feels fatigued but is unable to rest. Describes spending most of the day in his room, avoiding people, and watching TV.Feels "scared to leave" his home, citing overwhelming stress. Major Depressive Disorder (MDD) and Opioid Use Disorder (OUD).Prescribed Lexapro  10 mg daily, which he stopped four months ago. Reports prior positive response to the medication. Intermittent use of Suboxone  for OUD, last prescribed in June 2023, as verified by the Prescription Drug Monitoring Program (PDMP).Denies prior inpatient psychiatric hospitalizations or documented suicide attempts.Denies current opioid use but admits to intermittently using Suboxone  obtained from friends to manage withdrawal symptoms. Last use was approximately one day ago. Benzodiazepines: UDS positive for benzodiazepines; patient acknowledges taking Xanax  provided by his son. Denies alcohol  or other substance use.CCurrently experiencing a  nonproductive cough, for which a chest X-ray has been ordered.History of kidney stones.Reports significant stress after losing his job one month ago, leading to financial instability and loss of insurance coverage for Suboxone .Lives alone, describing avoidance of social interactions and fear of leaving his home. Suicidal ideation with a specific plan to hang himself. History of medication nonadherence, including Lexapro  and Suboxone .Reports severe insomnia, paranoia, and social isolation.Denies homicidal ideation (HI), auditory or visual hallucinations (AVH), or delusions. Denies current alcohol  or regular opioid use.The patient presents with severe depression and active suicidal ideation, consistent with Major Depressive Disorder (MDD). His condition is exacerbated by psychosocial stressors, medication nonadherence, and a history of Opioid Use Disorder (OUD). Immediate psychiatric stabilization and treatment are warranted.   Associated Signs/Symptoms: Depression Symptoms:  insomnia, feelings of worthlessness/guilt, difficulty concentrating, hopelessness, suicidal thoughts with specific plan, anxiety, weight loss, decreased appetite, (Hypo) Manic Symptoms:  Labiality of Mood, Anxiety Symptoms:  Excessive Worry, Panic Symptoms, Social Anxiety, Psychotic Symptoms:   none noted PTSD Symptoms: Negative Total Time spent with patient: 2 hours  Past Psychiatric History: MDD per patient  Is the patient at risk to self? Yes.    Has the patient been a risk to self in the past 6 months? Yes.    Has the patient been a risk to self within the distant past? Yes.    Is the patient a risk to others? No.  Has the patient been a risk to others in the past 6 months? No.  Has the patient been a risk to others within the distant past? No.   Columbia Scale:  Flowsheet Row Admission (Current) from 12/27/2023 in [facility] INPATIENT BEHAVIORAL MEDICINE Most recent reading at 12/27/2023 11:00 PM ED from 12/27/2023  in [facility] Emergency Department at [facility] Most recent reading at 12/27/2023  1:31 PM ED to Hosp-Admission (Discharged) from 01/03/2023 in [facility] Most recent  reading at 01/06/2023 10:00 AM  C-SSRS RISK CATEGORY High Risk High Risk Error: Q3, 4, or 5 should not be populated when Q2 is No        Prior Inpatient Therapy: No. If yes, describe none  Prior Outpatient Therapy: No. If yes, describe none   Alcohol  Screening: 1. How often do you have a drink containing alcohol ?: Monthly or less 2. How many drinks containing alcohol  do you have on a typical day when you are drinking?: 3 or 4 3. How often do you have six or more drinks on one occasion?: Less than monthly AUDIT-C Score: 3 4. How often during the last year have you found that you were not able to stop drinking once you had started?: Never 5. How often during the last year have you failed to do what was normally expected from you because of drinking?: Never 6. How often during the last year have you needed a first drink in the morning to get yourself going after a heavy drinking session?: Never 7. How often during the last year have you had a feeling of guilt of remorse after drinking?: Never 8. How often during the last year have you been unable to remember what happened the night before because you had been drinking?: Never 9. Have you or someone else been injured as a result of your drinking?: No 10. Has a relative or friend or a doctor or another health worker been concerned about your drinking or suggested you cut down?: No Alcohol  Use Disorder Identification Test Final Score (AUDIT): 3 Alcohol  Brief Interventions/Follow-up: Patient Refused Substance Abuse History in the last 12 months:  Yes.   Consequences of Substance Abuse: Negative Previous Psychotropic Medications: Yes  Psychological Evaluations: No  Past Medical History:  Past Medical History:  Diagnosis Date   Chronic  cough    Urolithiasis    Urolithiasis     Past Surgical History:  Procedure Laterality Date   CATARACT EXTRACTION W/PHACO Left 08/09/2021   Procedure: CATARACT EXTRACTION PHACO AND INTRAOCULAR LENS PLACEMENT (IOC) LEFT .051 00:10.0;  Surgeon: Mittie Gaskin, MD;  Location: [facility];  Service: Ophthalmology;  Laterality: Left;   CATARACT EXTRACTION W/PHACO Right 10/04/2021   Procedure: CATARACT EXTRACTION PHACO AND INTRAOCULAR LENS PLACEMENT (IOC) RIGHT 0.67 00:13.8;  Surgeon: Mittie Gaskin, MD;  Location: [facility];  Service: Ophthalmology;  Laterality: Right;  general anesthesia needs to be last case   HERNIA REPAIR     x2   MANDIBLE FRACTURE SURGERY     Family History: History reviewed. No pertinent family history. Family Psychiatric  History: none reported Tobacco Screening:  Social History   Tobacco Use  Smoking Status Every Day   Current packs/day: 1.00   Average packs/day: 1 pack/day for 35.0 years (35.0 ttl pk-yrs)   Types: Cigarettes  Smokeless Tobacco Never  Tobacco Comments   Started smoking age 27    BH Tobacco Counseling     Are you interested in Tobacco Cessation Medications?  No value filed. Counseled patient on smoking cessation:  No value filed. Reason Tobacco Screening Not Completed: No value filed.       Social History:  Social History   Substance and Sexual Activity  Alcohol  Use No     Social History   Substance and Sexual Activity  Drug Use Not on file    Additional Social History:  Allergies:  No Known Allergies Lab Results:  Results for orders placed or performed during the hospital encounter of 12/27/23 (from the past 48 hours)  Comprehensive metabolic panel     Status: Abnormal   Collection Time: 12/27/23  4:43 AM  Result Value Ref Range   Sodium 139 135 - 145 mmol/L   Potassium 3.6 3.5 - 5.1 mmol/L   Chloride 100 98 - 111 mmol/L   CO2 28 22 - 32 mmol/L   Glucose,  Bld 107 (H) 70 - 99 mg/dL    Comment: Glucose reference range applies only to samples taken after fasting for at least 8 hours.   BUN 13 6 - 20 mg/dL   Creatinine, Ser 9.09 0.61 - 1.24 mg/dL   Calcium 8.9 8.9 - 89.6 mg/dL   Total Protein 6.6 6.5 - 8.1 g/dL   Albumin 3.9 3.5 - 5.0 g/dL   AST 20 15 - 41 U/L   ALT 12 0 - 44 U/L   Alkaline Phosphatase 88 38 - 126 U/L   Total Bilirubin 0.6 0.0 - 1.2 mg/dL   GFR, Estimated >39 >39 mL/min    Comment: (NOTE) Calculated using the CKD-EPI Creatinine Equation (2021)    Anion gap 11 5 - 15    Comment: Performed at [facility], [address] [place], [place], [place] [address]  cbc     Status: Abnormal   Collection Time: 12/27/23  4:43 AM  Result Value Ref Range   WBC 3.4 (L) 4.0 - 10.5 K/uL   RBC 5.51 4.22 - 5.81 MIL/uL   Hemoglobin 17.0 13.0 - 17.0 g/dL   HCT 49.5 60.9 - 47.9 %   MCV 91.5 80.0 - 100.0 fL   MCH 30.9 26.0 - 34.0 pg   MCHC 33.7 30.0 - 36.0 g/dL   RDW 85.7 88.4 - 84.4 %   Platelets 305 150 - 400 K/uL   nRBC 0.0 0.0 - 0.2 %    Comment: Performed at [facility], [address] [place], [place] [address]  Troponin I (High Sensitivity)     Status: None   Collection Time: 12/27/23  4:43 AM  Result Value Ref Range   Troponin I (High Sensitivity) 4 <18 ng/L    Comment: (NOTE) Elevated high sensitivity troponin I (hsTnI) values and significant  changes across serial measurements may suggest ACS but many other  chronic and acute conditions are known to elevate hsTnI results.  Refer to the Links section for chest pain algorithms and additional  guidance. Performed at [facility], [address] [place], [place] [address]   Urine Drug Screen, Qualitative     Status: Abnormal   Collection Time: 12/27/23  5:00 AM  Result Value Ref Range   Tricyclic, Ur Screen NONE DETECTED NONE DETECTED   Amphetamines, Ur Screen NONE DETECTED NONE DETECTED   MDMA (Ecstasy)Ur Screen NONE DETECTED NONE DETECTED    Cocaine Metabolite,Ur [place] NONE DETECTED NONE DETECTED   Opiate, Ur Screen NONE DETECTED NONE DETECTED   Phencyclidine (PCP) Ur S NONE DETECTED NONE DETECTED   Cannabinoid 50 Ng, Ur [place] NONE DETECTED NONE DETECTED   Barbiturates, Ur Screen NONE DETECTED NONE DETECTED   Benzodiazepine, Ur Scrn POSITIVE (A) NONE DETECTED   Methadone Scn, Ur NONE DETECTED NONE DETECTED    Comment: (NOTE) Tricyclics + metabolites, urine    Cutoff 1000 ng/mL Amphetamines + metabolites, urine  Cutoff 1000 ng/mL MDMA (Ecstasy), urine  Cutoff 500 ng/mL Cocaine Metabolite, urine          Cutoff 300 ng/mL Opiate + metabolites, urine        Cutoff 300 ng/mL Phencyclidine (PCP), urine         Cutoff 25 ng/mL Cannabinoid, urine                 Cutoff 50 ng/mL Barbiturates + metabolites, urine  Cutoff 200 ng/mL Benzodiazepine, urine              Cutoff 200 ng/mL Methadone, urine                   Cutoff 300 ng/mL  The urine drug screen provides only a preliminary, unconfirmed analytical test result and should not be used for non-medical purposes. Clinical consideration and professional judgment should be applied to any positive drug screen result due to possible interfering substances. A more specific alternate chemical method must be used in order to obtain a confirmed analytical result. Gas chromatography / mass spectrometry (GC/MS) is the preferred confirm atory method. Performed at [facility], [address] [place], [place] [address]   Acetaminophen  level     Status: Abnormal   Collection Time: 12/27/23  7:46 PM  Result Value Ref Range   Acetaminophen  (Tylenol ), Serum <10 (L) 10 - 30 ug/mL    Comment: (NOTE) Therapeutic concentrations vary significantly. A range of 10-30 ug/mL  may be an effective concentration for many patients. However, some  are best treated at concentrations outside of this range. Acetaminophen  concentrations >150 ug/mL at 4 hours after ingestion  and >50 ug/mL  at 12 hours after ingestion are often associated with  toxic reactions.  Performed at [facility], [address] [place], [place] [address]   Ethanol     Status: None   Collection Time: 12/27/23  7:46 PM  Result Value Ref Range   Alcohol , Ethyl (B) <10 <10 mg/dL    Comment: (NOTE) Lowest detectable limit for serum alcohol  is 10 mg/dL.  For medical purposes only. Performed at [facility], [address] [place], [place] [address]   Salicylate level     Status: Abnormal   Collection Time: 12/27/23  7:46 PM  Result Value Ref Range   Salicylate Lvl <7.0 (L) 7.0 - 30.0 mg/dL    Comment: Performed at [facility], [address] [place], [place] [address]    Blood Alcohol  level:  Lab Results  Component Value Date   [facility] <10 12/27/2023    Metabolic Disorder Labs:  No results found for: HGBA1C, MPG No results found for: PROLACTIN No results found for: CHOL, TRIG, HDL, CHOLHDL, VLDL, LDLCALC  Current Medications: Current Facility-Administered Medications  Medication Dose Route Frequency Provider Last Rate Last Admin   acetaminophen  (TYLENOL ) tablet 650 mg  650 mg Oral Q6H PRN Penn, Cicely, NP       alum & mag hydroxide-simeth (MAALOX/MYLANTA) 200-200-20 MG/5ML suspension 30 mL  30 mL Oral Q4H PRN Penn, Cicely, NP       haloperidol  (HALDOL ) tablet 5 mg  5 mg Oral TID PRN Sammy Molder, NP       And   diphenhydrAMINE  (BENADRYL ) capsule 50 mg  50 mg Oral TID PRN Sammy Molder, NP       escitalopram  (LEXAPRO ) tablet 20 mg  20 mg Oral Daily Terrye Dombrosky S, NP   20 mg at 12/28/23 1334   feeding supplement (ENSURE ENLIVE / ENSURE PLUS) liquid 237 mL  237 mL Oral BID BM Garlon Tuggle S, NP   237 mL at 12/28/23 1336   folic acid  (FOLVITE ) tablet 1 mg  1 mg Oral Daily Deroy Noah S, NP   1 mg at 12/28/23 1335   hydrOXYzine  (ATARAX ) tablet 25 mg  25 mg Oral TID PRN Sammy Molder, NP   25 mg at 12/28/23 1335   LORazepam  (ATIVAN ) tablet 1-4 mg   1-4 mg Oral Q1H PRN Vani Gunner S, NP       Or   LORazepam  (ATIVAN ) injection 1-4 mg  1-4 mg Intravenous Q1H PRN Momo Braun S, NP       LORazepam  (ATIVAN ) tablet 0-4 mg  0-4 mg Oral Q6H Nicholaus Brad RAMAN, NP   2 mg at 12/28/23 1334   Followed by   NOREEN ON 12/30/2023] LORazepam  (ATIVAN ) tablet 0-4 mg  0-4 mg Oral Q12H Nicholaus Brad RAMAN, NP       magnesium  hydroxide (MILK OF MAGNESIA) suspension 30 mL  30 mL Oral Daily PRN Penn, Molder, NP       melatonin tablet 5 mg  5 mg Oral QHS Alishah Schulte S, NP       multivitamin with minerals tablet 1 tablet  1 tablet Oral Daily Sarahelizabeth Conway S, NP   1 tablet at 12/28/23 1334   thiamine  (VITAMIN B1) tablet 100 mg  100 mg Oral Daily Nicholaus Brad RAMAN, NP   100 mg at 12/28/23 1335   Or   thiamine  (VITAMIN B1) injection 100 mg  100 mg Intravenous Daily Nicholaus Brad RAMAN, NP       traZODone  (DESYREL ) tablet 50 mg  50 mg Oral QHS PRN Sammy Molder, NP   50 mg at 12/27/23 2154   PTA Medications: Medications Prior to Admission  Medication Sig Dispense Refill Last Dose/Taking   escitalopram  (LEXAPRO ) 10 MG tablet Take 1 tablet (10 mg total) by mouth daily. 30 tablet 2    feeding supplement (ENSURE ENLIVE / ENSURE PLUS) LIQD Take 237 mLs by mouth 3 (three) times daily between meals.      Multiple Vitamin (MULTIVITAMIN WITH MINERALS) TABS tablet Take 1 tablet by mouth daily. (Patient not taking: Reported on 12/27/2023)      nicotine  (NICODERM CQ  - DOSED IN MG/24 HOURS) 14 mg/24hr patch Place 1 patch (14 mg total) onto the skin daily. (Patient not taking: Reported on 12/27/2023) 28 patch 0     Musculoskeletal: Strength & Muscle Tone: within normal limits Gait & Station: normal Patient leans: N/A            Psychiatric Specialty Exam:  Presentation  General Appearance:  Disheveled  Eye Contact: Minimal  Speech: Slow; Clear and Coherent  Speech Volume: Decreased  Handedness: Right   Mood and Affect  Mood: Hopeless (helpless.)  Affect: Flat;  Depressed; Congruent   Thought Process  Thought Processes: Linear; Goal Directed (feelings of hopelessness.)  Duration of Psychotic Symptoms:N/A Past Diagnosis of Schizophrenia or Psychoactive disorder: No  Descriptions of Associations:Intact  Orientation:Full (Time, Place and Person)  Thought Content:Rumination (No evidence of grandiosity or obsessive thoughts.)  Hallucinations:Hallucinations: None  Ideas of Reference:Paranoia  Suicidal Thoughts:Suicidal Thoughts: Yes, Passive SI Passive Intent and/or Plan: Without Intent; With Plan; Without Means to Carry Out; Without Access to Means (a plan to hang himself.)  Homicidal Thoughts:Homicidal Thoughts: No   Sensorium  Memory: Immediate Good; Remote Good  Judgment: Impaired (Engaged in unsafe behaviors, such as using Xanax  obtained from his son and intermittent use of non-prescribed Suboxone .  Struggles with managing stress and suicidal ideation effectively.)  Insight: Shallow (cknowledges the need for help but has difficulty recognizing the impact of medication nonadherence and substance misuse.)   Executive Functions  Concentration: Fair  Attention Span: Fair  Recall: Good  Fund of Knowledge: Good  Language: Good   Psychomotor Activity  Psychomotor Activity: Psychomotor Activity: Normal   Assets  Assets: Communication Skills; Housing   Sleep  Sleep: Sleep: Poor Number of Hours of Sleep: 2    Physical Exam: Physical Exam Vitals and nursing note reviewed.  Constitutional:      Appearance: Normal appearance.  HENT:     Head: Normocephalic and atraumatic.     Nose: Nose normal.  Pulmonary:     Effort: Pulmonary effort is normal.     Comments: Non productive cough Musculoskeletal:        General: Normal range of motion.     Cervical back: Normal range of motion.  Neurological:     General: No focal deficit present.     Mental Status: He is alert and oriented to person, place, and time.  Mental status is at baseline.  Psychiatric:        Attention and Perception: Attention and perception normal.        Mood and Affect: Mood is anxious and depressed. Affect is flat.        Speech: Speech is delayed.        Behavior: Behavior is withdrawn. Behavior is cooperative.        Thought Content: Thought content normal.        Cognition and Memory: Cognition and memory normal.        Judgment: Judgment normal.    ROS Blood pressure 107/85, pulse 81, temperature 98.7 F (37.1 C), temperature source Oral, resp. rate 16, height 5' 7 (1.702 m), weight 51.3 kg, SpO2 91%. Body mass index is 17.7 kg/m.  Treatment Plan Summary: Daily contact with patient to assess and evaluate symptoms and progress in treatment and Medication management Lexapro  (Escitalopram ): Restart at 10 mg daily, with plans to titrate to 20 mg daily as tolerated. Melatonin: 3 mg nightly to assist with sleep. Ativan  (Lorazepam ): Administer per CIWA protocol for benzodiazepine misuse. Suboxone : Not ordered due to inconsistent use and lack of recent prescription (18 months). Chest X-ray: Evaluate nonproductive cough to rule out infections or exacerbation of COPD. Hospitalist Consultation: Address physical health concerns, including cough and potential respiratory issues. Educate the patient on the risks of obtaining Suboxone  and Xanax  from nonmedical sources. Observation Level/Precautions:  Continuous Observation Detox 15 minute checks Seizure  Laboratory:   LIPIDS A1C  Psychotherapy:    Medications:    Consultations:    Discharge Concerns:    Estimated LOS:  Other:     Physician Treatment Plan for Primary Diagnosis: Major depressive disorder, recurrent episode, moderate (HCC) Long Term Goal(s): Improvement in symptoms so as ready for discharge  Short Term Goals: Ability to identify changes in lifestyle to reduce recurrence of condition will improve, Ability to verbalize feelings will improve, Ability to  disclose and discuss suicidal ideas, Ability to demonstrate self-control will improve, Ability to identify and develop effective coping behaviors will improve, Ability to maintain clinical measurements within normal limits will improve, Compliance with prescribed medications will improve, and Ability to identify triggers associated with substance abuse/mental health issues will improve  Physician Treatment Plan for Secondary Diagnosis: Principal Problem:   Major depressive disorder, recurrent episode, moderate (HCC) Active Problems:   Benzodiazepine abuse (HCC)  Suicidal ideations  Long Term Goal(s): Improvement in symptoms so as ready for discharge  Short Term Goals: Ability to identify changes in lifestyle to reduce recurrence of condition will improve, Ability to verbalize feelings will improve, Ability to disclose and discuss suicidal ideas, Ability to demonstrate self-control will improve, Ability to identify and develop effective coping behaviors will improve, Ability to maintain clinical measurements within normal limits will improve, Compliance with prescribed medications will improve, and Ability to identify triggers associated with substance abuse/mental health issues will improve  I certify that inpatient services furnished can reasonably be expected to improve the patient's condition.    Brad GORMAN Moats, NP 1/11/20255:12 PM

## 2023-12-28 NOTE — Group Note (Signed)
 Date:  12/28/2023 Time:  3:26 AM  Group Topic/Focus:  Goals Group:   The focus of this group is to help patients establish daily goals to achieve during treatment and discuss how the patient can incorporate goal setting into their daily lives to aide in recovery.    Participation Level:  Did Not Attend  Corey House 12/28/2023, 3:26 AM

## 2023-12-28 NOTE — Consult Note (Signed)
 [facility] Psychiatric Consult Initial  Patient Name: .Corey House.  MRN: [id number]  DOB: 03-28-1966  Consult Order details:    Mode of Visit: In person    Psychiatry Consult Evaluation  Service Date: December 28, 2023 LOS:  LOS: 0 days  Chief Complaint suicidal thoughts  Primary Psychiatric Diagnoses  Suicidal ideation 2.  MDD   Assessment  Zachariah Pavek. is a 58 y.o. male admitted:to [facility] ED on  12/27/2023  6:12 AM for suicidal ideations. He carries the psychiatric diagnoses of MDD and opioid use disorder and has a past medical history of  urolithiasis. He has a history of being prescribed Lexapro  10 mg daily to manage depressive symptoms. Patient reports that he is not currently taking any psychotropic medications at this time, reporting last dose was 4 months ago.   Patient reports SI with a plan to hang himself. He reports not being able to sleep and being scared to leave where he lives.   Patient is alert and oriented x4 and willing to engage. He is noted lying in the bed, calm with depressed mood and affect.   His current presentation of suicidal ideations is most consistent with MDD. He meets criteria for inpatient psychiatry based on suicidal intent to hang himself.  Current outpatient psychotropic medications include Lexapro  and historically he has had a effective response to these medications. He was non compliant with medications prior to admission as evidenced by self reports of not taking Lexapro .  Please see plan below for detailed recommendations.   Diagnoses:  Active Hospital problems: Active Problems:   Depression    Plan     ## Medical Decision Making Capacity: Not specifically addressed in this encounter  ## Further Work-up:  --  UDS: positive for benxodiazepine  -- Pertinent labwork reviewed earlier this admission includes: BMP, CBC   ## Disposition:-- We recommend inpatient psychiatric hospitalization when medically cleared. Patient  is under voluntary admission status at this time; please IVC if attempts to leave hospital.  ## Behavioral / Environmental: - No specific recommendations at this time.     ## Safety and Observation Level:  - Based on my clinical evaluation, I estimate the patient to be at risk of self harm in the current setting. - At this time, we recommend  routine. This decision is based on my review of the chart including patient's history and current presentation, interview of the patient, mental status examination, and consideration of suicide risk including evaluating suicidal ideation, plan, intent, suicidal or self-harm behaviors, risk factors, and protective factors. This judgment is based on our ability to directly address suicide risk, implement suicide prevention strategies, and develop a safety plan while the patient is in the clinical setting. Please contact our team if there is a concern that risk level has changed.  CSSR Risk Category:C-SSRS RISK CATEGORY: High Risk  Suicide Risk Assessment: Patient has following modifiable risk factors for suicide: active suicidal ideation, which we are addressing by inpatient psychiatry/treatment. Patient has following non-modifiable or demographic risk factors for suicide: male gender Patient has the following protective factors against suicide: inpatient psychiatry/treatment.  Thank you for this consult request. Recommendations have been communicated to the primary team.  We will recommend inpatient psychiatry at this time.   Reymundo Salmon, NP       History of Present Illness  Relevant Aspects of Hospital ED Course:  Admitted on 12/27/2023 for suicidal ideations.   Patient Report:  Suicidal intent to hang himself.  Psych  ROS:  Depression: yes Anxiety:  yes Mania (lifetime and current): none Psychosis: (lifetime and current): paranoia   Review of Systems  Psychiatric/Behavioral:  Positive for depression and suicidal ideas. The patient has  insomnia.   All other systems reviewed and are negative.    Psychiatric and Social History  Psychiatric History:  Information collected from patient  Prev Dx/Sx: MDD Current Psych Provider: none Home Meds (current): none Previous Med Trials: Lexapro  Therapy: none  Prior Psych Hospitalization:   Prior Self Harm:  Prior Violence: denies  Family Psych History:  Family Hx suicide:   Social History:  Developmental Hx:  Educational Hx:  Occupational Hx:  Legal Hx:  Living Situation:  Spiritual Hx:  Access to weapons/lethal means: no   Substance History Alcohol :   Type of alcohol   Last Drink  Number of drinks per day  History of alcohol  withdrawal seizures  History of DT's  Tobacco:  Illicit drugs:  Prescription drug abuse:  Rehab hx:   Exam Findings  Physical Exam:  Vital Signs:  Temp:  [98.2 F (36.8 C)-98.4 F (36.9 C)] 98.4 F (36.9 C) (01/10 2106) Pulse Rate:  [68-90] 90 (01/10 2106) Resp:  [18-19] 18 (01/10 2106) BP: (100-123)/(80-89) 123/89 (01/10 2106) SpO2:  [85 %-94 %] 85 % (01/10 2106) Weight:  [51.3 kg] 51.3 kg (01/10 2106) Blood pressure 100/80, pulse 68, temperature 98.2 F (36.8 C), temperature source Oral, resp. rate 19, SpO2 94%. There is no height or weight on file to calculate BMI.  Physical Exam Vitals and nursing note reviewed.  Neurological:     Mental Status: He is alert and oriented to person, place, and time.     Mental Status Exam: General Appearance: Disheveled  Orientation:  Full (Time, Place, and Person)  Memory:  Immediate;   Good Recent;   Good Remote;   Good  Concentration:  Concentration: Good and Attention Span: Good  Recall:  Good  Attention  Good  Eye Contact:  Good  Speech:  Clear and Coherent  Language:  Good  Volume:  Normal  Mood: depressed  Affect:  Congruent  Thought Process:  Goal Directed  Thought Content:  Logical  Suicidal Thoughts:  No  Homicidal Thoughts:  No  Judgement:  Poor  Insight:  Fair   Psychomotor Activity:  Normal  Akathisia:  No  Fund of Knowledge:  Good      Assets:  Communication Skills  Cognition:  WNL  ADL's:  Intact  AIMS (if indicated):  n/a     Other History   These have been pulled in through the EMR, reviewed, and updated if appropriate.  Family History:  The patient's family history is not on file.  Medical History: Past Medical History:  Diagnosis Date   Chronic cough    Urolithiasis    Urolithiasis     Surgical History: Past Surgical History:  Procedure Laterality Date   CATARACT EXTRACTION W/PHACO Left 08/09/2021   Procedure: CATARACT EXTRACTION PHACO AND INTRAOCULAR LENS PLACEMENT (IOC) LEFT .051 00:10.0;  Surgeon: Mittie Gaskin, MD;  Location: [facility];  Service: Ophthalmology;  Laterality: Left;   CATARACT EXTRACTION W/PHACO Right 10/04/2021   Procedure: CATARACT EXTRACTION PHACO AND INTRAOCULAR LENS PLACEMENT (IOC) RIGHT 0.67 00:13.8;  Surgeon: Mittie Gaskin, MD;  Location: [facility];  Service: Ophthalmology;  Laterality: Right;  general anesthesia needs to be last case   HERNIA REPAIR     x2   MANDIBLE FRACTURE SURGERY       Medications:  No  current facility-administered medications for this encounter. No current outpatient medications on file.  Facility-Administered Medications Ordered in Other Encounters:    acetaminophen  (TYLENOL ) tablet 650 mg, 650 mg, Oral, Q6H PRN, Gilman Olazabal, NP   alum & mag hydroxide-simeth (MAALOX/MYLANTA) 200-200-20 MG/5ML suspension 30 mL, 30 mL, Oral, Q4H PRN, Naomia Lenderman, NP   haloperidol  (HALDOL ) tablet 5 mg, 5 mg, Oral, TID PRN **AND** diphenhydrAMINE  (BENADRYL ) capsule 50 mg, 50 mg, Oral, TID PRN, Vanellope Passmore, NP   hydrOXYzine  (ATARAX ) tablet 25 mg, 25 mg, Oral, TID PRN, Babara Buffalo, NP, 25 mg at 12/27/23 2154   magnesium  hydroxide (MILK OF MAGNESIA) suspension 30 mL, 30 mL, Oral, Daily PRN, Norvell Ureste, NP   traZODone  (DESYREL ) tablet 50 mg, 50 mg, Oral,  QHS PRN, Cleofas Hudgins, NP, 50 mg at 12/27/23 2154  Allergies: No Known Allergies  Reymundo Salmon, NP

## 2023-12-28 NOTE — BHH Suicide Risk Assessment (Addendum)
 [facility] Admission Suicide Risk Assessment   Nursing information obtained from:    Demographic factors:  Male Current Mental Status:  Suicidal ideation indicated by patient Loss Factors:  Decline in physical health Historical Factors:  NA Risk Reduction Factors:  Positive coping skills or problem solving skills  Total Time spent with patient: 2 hours Principal Problem: Major depressive disorder, recurrent episode, moderate (HCC) Diagnosis:  Principal Problem:   Major depressive disorder, recurrent episode, moderate (HCC) Active Problems:   Benzodiazepine abuse (HCC)   Suicidal ideations  Subjective Data: 58 year old Caucasian male,presents with suicidal ideation (SI) and a plan to hang himself. He reports feeling increasingly hopeless and helpless over the past month, with worsening symptoms over the last few days.The patient states he has been struggling with depression and opioid withdrawal after losing access to Suboxone  due to job loss and lack of insurance. Reports intermittent use of Suboxone  obtained from nonmedical sources but denies regular opioid use. Last prescribed dose was June 2023 (per PDMP), she eports difficulty sleeping, stating, "I can't sleep at all." the patient feels scared to leave where he lives and states he isolates himself in his room, spending most of the day watching TV. He expresses feelings of paranoia and stress but denies having auditory or visual hallucinations. The patient  previously prescribed Lexapro  10 mg daily, which he reports helped manage his depression. However, he stopped taking it 4 months ago. No history of inpatient psychiatric hospitalization or documented suicide attempts, though he currently expresses suicidal intent.  Continued Clinical Symptoms:  Alcohol  Use Disorder Identification Test Final Score (AUDIT): 3 The Alcohol  Use Disorders Identification Test, Guidelines for Use in Primary Care, Second Edition.  World Science Writer  [facility] Score between 0-7:  no or low risk or alcohol  related problems. Score between 8-15:  moderate risk of alcohol  related problems. Score between 16-19:  high risk of alcohol  related problems. Score 20 or above:  warrants further diagnostic evaluation for alcohol  dependence and treatment.   CLINICAL FACTORS:   Depression:   Comorbid alcohol  abuse/dependence Hopelessness Dysthymia Alcohol /Substance Abuse/Dependencies Medical Diagnoses and Treatments/Surgeries   Musculoskeletal: Strength & Muscle Tone: within normal limits Gait & Station: normal Patient leans: N/A  Psychiatric Specialty Exam:  Presentation  General Appearance: Disheveled  Eye Contact:Minimal  Speech:Slow; Clear and Coherent  Speech Volume:Decreased  Handedness:Right   Mood and Affect  Mood:Hopeless (helpless.)  Affect:Flat; Depressed; Congruent   Thought Process  Thought Processes:Linear; Goal Directed (feelings of hopelessness.)  Descriptions of Associations:Intact  Orientation:Full (Time, Place and Person)  Thought Content:Rumination (No evidence of grandiosity or obsessive thoughts.)  History of Schizophrenia/Schizoaffective disorder:No  Duration of Psychotic Symptoms:none recorded Hallucinations:Hallucinations: None  Ideas of Reference:Paranoia  Suicidal Thoughts:Suicidal Thoughts: Yes, Passive SI Passive Intent and/or Plan: Without Intent; With Plan; Without Means to Carry Out; Without Access to Means (a plan to hang himself.)  Homicidal Thoughts:Homicidal Thoughts: No   Sensorium  Memory:Immediate Good; Remote Good  Judgment:Impaired (Engaged in unsafe behaviors, such as using Xanax  obtained from his son and intermittent use of non-prescribed Suboxone . Struggles with managing stress and suicidal ideation effectively.)  Insight:Shallow (cknowledges the need for help but has difficulty recognizing the impact of medication nonadherence and substance misuse.)   Executive  Functions  Concentration:Fair  Attention Span:Fair  Recall:Good  Fund of Knowledge:Good  Language:Good   Psychomotor Activity  Psychomotor Activity:Psychomotor Activity: Normal   Assets  Assets:Communication Skills; Housing   Sleep  Sleep:Sleep: Poor Number of Hours of Sleep: 2    Physical Exam:  Physical Exam Vitals and nursing note reviewed.  Constitutional:      Appearance: Normal appearance.  HENT:     Head: Normocephalic and atraumatic.     Nose: Nose normal.  Pulmonary:     Effort: Pulmonary effort is normal.  Musculoskeletal:        General: Normal range of motion.     Cervical back: Normal range of motion.  Neurological:     General: No focal deficit present.     Mental Status: He is alert and oriented to person, place, and time. Mental status is at baseline.  Psychiatric:        Attention and Perception: Attention and perception normal.        Mood and Affect: Mood is anxious and depressed. Affect is flat.        Speech: Speech is delayed.        Behavior: Behavior is withdrawn. Behavior is cooperative.        Thought Content: Thought content normal.        Cognition and Memory: Cognition and memory normal.        Judgment: Judgment normal.    ROS Blood pressure 107/85, pulse 81, temperature 98.7 F (37.1 C), temperature source Oral, resp. rate 16, height 5' 7 (1.702 m), weight 51.3 kg, SpO2 91%. Body mass index is 17.7 kg/m.   COGNITIVE FEATURES THAT CONTRIBUTE TO RISK:  None    SUICIDE RISK:   Minimal: No identifiable suicidal ideation.  Patients presenting with no risk factors but with morbid ruminations; may be classified as minimal risk based on the severity of the depressive symptoms  PLAN OF CARE:  Lexapro  (Escitalopram ): Restart at 10 mg daily, with plans to titrate to 20 mg daily as tolerated. Melatonin: 3 mg nightly to assist with sleep. Ativan  (Lorazepam ): Administer per CIWA protocol for benzodiazepine misuse. Suboxone : Not  ordered due to inconsistent use and lack of recent prescription (18 months). Chest X-ray: Evaluate nonproductive cough to rule out infections or exacerbation of COPD. Hospitalist Consultation: Address physical health concerns, including cough and potential respiratory issues. Educate the patient on the risks of obtaining Suboxone  and Xanax  from nonmedical sources. I certify that inpatient services furnished can reasonably be expected to improve the patient's condition.   Brad GORMAN Moats, NP 12/28/2023, 4:48 PM

## 2023-12-28 NOTE — Progress Notes (Signed)
   12/28/23 1800  Psych Admission Type (Psych Patients Only)  Admission Status Voluntary  Psychosocial Assessment  Patient Complaints Anhedonia;Depression  Eye Contact Poor  Facial Expression Flat  Affect Flat  Speech Slow  Interaction Minimal  Motor Activity Slow  Appearance/Hygiene In scrubs  Behavior Characteristics Guarded;Irritable  Mood Depressed;Anhedonia  Thought Process  Coherency WDL  Content WDL  Delusions None reported or observed  Perception WDL  Hallucination None reported or observed  Judgment WDL  Confusion WDL  Danger to Self  Current suicidal ideation? Active  Agreement Not to Harm Self Yes  Description of Agreement verbal  Danger to Others  Danger to Others None reported or observed

## 2023-12-28 NOTE — BH Assessment (Cosign Needed)
.  Admission Note:  58 yr male who presents voluntary  in no acute distress for the treatment of SI and medication management. Pt appears flat and depressed. Pt was calm and cooperative with admission process. Pt presents said he was having SI thoughts but denies a plan. Pt denies AVH . Pt states he has been taking sub axone for 10 years and ran out of the medication. And every since then he has been having SI thoughts.. Skin was assessed. PT searched and no contraband found, POC and unit policies explained and understanding verbalized. Consents obtained. Food and fluids offered, and fluids accepted. Pt had no additional questions or concerns

## 2023-12-29 DIAGNOSIS — F331 Major depressive disorder, recurrent, moderate: Secondary | ICD-10-CM | POA: Diagnosis not present

## 2023-12-29 LAB — LIPID PANEL
Cholesterol: 155 mg/dL (ref 0–200)
HDL: 31 mg/dL — ABNORMAL LOW (ref >40)
LDL Cholesterol: 101 mg/dL — ABNORMAL HIGH (ref 0–99)
Total CHOL/HDL Ratio: 5 {ratio}
Triglycerides: 114 mg/dL (ref <150)
VLDL: 23 mg/dL (ref 0–40)

## 2023-12-29 LAB — HEMOGLOBIN A1C
Hgb A1c MFr Bld: 6.1 % — ABNORMAL HIGH (ref 4.8–5.6)
Mean Plasma Glucose: 128.37 mg/dL

## 2023-12-29 MED ORDER — NICOTINE 14 MG/24HR TD PT24
14.0000 mg | MEDICATED_PATCH | Freq: Every day | TRANSDERMAL | Status: DC
  Administered 2023-12-30 – 2024-01-07 (×9): 14 mg via TRANSDERMAL
  Filled 2023-12-29 (×9): qty 1

## 2023-12-29 NOTE — Group Note (Signed)
 Date:  12/29/2023 Time:  9:51 PM  Group Topic/Focus:  Making Healthy Choices:   The focus of this group is to help patients identify negative/unhealthy choices they were using prior to admission and identify positive/healthier coping strategies to replace them upon discharge.    Participation Level:  Did Not Attend   Corey House 12/29/2023, 9:51 PM

## 2023-12-29 NOTE — Plan of Care (Signed)
  Problem: Activity: Goal: Interest or engagement in activities will improve Outcome: Progressing Goal: Sleeping patterns will improve Outcome: Progressing   Problem: Education: Goal: Knowledge of [facility] Education information/materials will improve Outcome: Progressing

## 2023-12-29 NOTE — BHH Suicide Risk Assessment (Signed)
 B[facility] INPATIENT:  Family/Significant Other Suicide Prevention Education  Suicide Prevention Education:  Contact Attempts: Cebert Dettmann, sister, [phone], has been identified by the patient as the family member/significant other with whom the patient will be residing, and identified as the person(s) who will aid the patient in the event of a mental health crisis.  With written consent from the patient, two attempts were made to provide suicide prevention education, prior to and/or following the patient's discharge.  We were unsuccessful in providing suicide prevention education.  A suicide education pamphlet was given to the patient to share with family/significant other.  Date and time of first attempt:12/29/23 at 4:00 pm Date and time of second attempt: still needed  Roselyn GORMAN Lento 12/29/2023, 3:59 PM

## 2023-12-29 NOTE — Group Note (Unsigned)
 Date:  12/29/2023 Time:  4:59 AM  Group Topic/Focus:  Wrap-Up Group:   The focus of this group is to help patients review their daily goal of treatment and discuss progress on daily workbooks.     Participation Level:  {BHH PARTICIPATION OZCZO:77735}  Participation Quality:  {BHH PARTICIPATION QUALITY:22265}  Affect:  {BHH AFFECT:22266}  Cognitive:  {BHH COGNITIVE:22267}  Insight: {BHH Insight2:20797}  Engagement in Group:  {BHH ENGAGEMENT IN HMNLE:77731}  Modes of Intervention:  {BHH MODES OF INTERVENTION:22269}  Additional Comments:  ***  Corey House 12/29/2023, 4:59 AM

## 2023-12-29 NOTE — Progress Notes (Signed)
 D- Patient alert and oriented. Denies SI, HI, AVH, and pain. Reports some improvement from yesterday in regards to withdrawal symptoms. I do feel a little bit better today. I just want to lay in bed.  A- Scheduled medications administered to patient, per MAR. Support and encouragement provided.  Routine safety checks conducted every 15 minutes.  Patient informed to notify staff with problems or concerns.  R- No adverse drug reactions noted. Patient contracts for safety at this time. Patient compliant with medications and treatment plan. Patient receptive, calm, and cooperative. Patient interacts well with others on the unit.  Patient remains safe at this time.

## 2023-12-29 NOTE — Progress Notes (Signed)
 [facility] MD Progress Note  12/29/2023 6:01 PM Corey House Corey House.  MRN:  [id number] Subjective:  58-Year-Old Caucasian Male  states, I do feel a little bit better today. I just want to lay in bed. He denies suicidal ideation (SI), homicidal ideation (HI), auditory/visual hallucinations (AVH), or pain. Reports some improvement in withdrawal symptoms compared to the previous day.The patient demonstrates mild improvement in withdrawal symptoms. He remains calm, cooperative, and compliant with the treatment plan, showing no signs of acute psychiatric distress or physical discomfort. The patient prefers low activity but continues to engage appropriately with staff and peers. Principal Problem: Major depressive disorder, recurrent episode, moderate (HCC) Diagnosis: Principal Problem:   Major depressive disorder, recurrent episode, moderate (HCC) Active Problems:   Benzodiazepine abuse (HCC)   Suicidal ideations  Total Time spent with patient: 1.5 hours  Past Psychiatric History: none prior to admission  Past Medical History:  Past Medical History:  Diagnosis Date  . Chronic cough   . Urolithiasis   . Urolithiasis     Past Surgical History:  Procedure Laterality Date  . CATARACT EXTRACTION W/PHACO Left 08/09/2021   Procedure: CATARACT EXTRACTION PHACO AND INTRAOCULAR LENS PLACEMENT (IOC) LEFT .051 00:10.0;  Surgeon: Mittie Gaskin, MD;  Location: [facility];  Service: Ophthalmology;  Laterality: Left;  . CATARACT EXTRACTION W/PHACO Right 10/04/2021   Procedure: CATARACT EXTRACTION PHACO AND INTRAOCULAR LENS PLACEMENT (IOC) RIGHT 0.67 00:13.8;  Surgeon: Mittie Gaskin, MD;  Location: [facility];  Service: Ophthalmology;  Laterality: Right;  general anesthesia needs to be last case  . HERNIA REPAIR     x2  . MANDIBLE FRACTURE SURGERY     Family History: History reviewed. No pertinent family history. Family Psychiatric  History: none reported Social History:   Social History   Substance and Sexual Activity  Alcohol  Use No     Social History   Substance and Sexual Activity  Drug Use Not on file    Social History   Socioeconomic History  . Marital status: Single    Spouse name: Not on file  . Number of children: Not on file  . Years of education: Not on file  . Highest education level: Not on file  Occupational History  . Not on file  Tobacco Use  . Smoking status: Every Day    Current packs/day: 1.00    Average packs/day: 1 pack/day for 35.0 years (35.0 ttl pk-yrs)    Types: Cigarettes  . Smokeless tobacco: Never  . Tobacco comments:    Started smoking age 39  Vaping Use  . Vaping status: Never Used  Substance and Sexual Activity  . Alcohol  use: No  . Drug use: Not on file  . Sexual activity: Not on file  Other Topics Concern  . Not on file  Social History Narrative  . Not on file   Social Drivers of Health   Financial Resource Strain: Not on file  Food Insecurity: Patient Declined (12/27/2023)   Hunger Vital Sign   . Worried About Programme Researcher, Broadcasting/film/video in the Last Year: Patient declined   . Ran Out of Food in the Last Year: Patient declined  Recent Concern: Food Insecurity - Food Insecurity Present (12/27/2023)   Hunger Vital Sign   . Worried About Programme Researcher, Broadcasting/film/video in the Last Year: Patient declined   . Ran Out of Food in the Last Year: Sometimes true  Transportation Needs: Patient Declined (12/27/2023)   PRAPARE - Transportation   . Lack of Transportation (  Medical): Patient declined   . Lack of Transportation (Non-Medical): Patient declined  Physical Activity: Not on file  Stress: Not on file  Social Connections: Not on file   Additional Social History:                         Sleep: Good  Appetite:  Good  Current Medications: Current Facility-Administered Medications  Medication Dose Route Frequency Provider Last Rate Last Admin  . acetaminophen  (TYLENOL ) tablet 650 mg  650 mg Oral Q6H PRN  Penn, Reymundo, NP   650 mg at 12/29/23 1348  . alum & mag hydroxide-simeth (MAALOX/MYLANTA) 200-200-20 MG/5ML suspension 30 mL  30 mL Oral Q4H PRN Penn, Cicely, NP      . haloperidol  (HALDOL ) tablet 5 mg  5 mg Oral TID PRN Sammy Reymundo, NP       And  . diphenhydrAMINE  (BENADRYL ) capsule 50 mg  50 mg Oral TID PRN Penn, Cicely, NP      . escitalopram  (LEXAPRO ) tablet 20 mg  20 mg Oral Daily Nicholaus Brad RAMAN, NP   20 mg at 12/29/23 0856  . feeding supplement (ENSURE ENLIVE / ENSURE PLUS) liquid 237 mL  237 mL Oral BID BM Nicholaus Brad RAMAN, NP   237 mL at 12/29/23 1059  . folic acid  (FOLVITE ) tablet 1 mg  1 mg Oral Daily Tamira Ryland S, NP   1 mg at 12/29/23 9143  . hydrOXYzine  (ATARAX ) tablet 25 mg  25 mg Oral TID PRN Sammy Reymundo, NP   25 mg at 12/28/23 1335  . LORazepam  (ATIVAN ) tablet 1-4 mg  1-4 mg Oral Q1H PRN Nicholaus Brad RAMAN, NP       Or  . LORazepam  (ATIVAN ) injection 1-4 mg  1-4 mg Intravenous Q1H PRN Nicholaus Brad RAMAN, NP      . LORazepam  (ATIVAN ) tablet 0-4 mg  0-4 mg Oral Q6H Nicholaus Brad RAMAN, NP   1 mg at 12/29/23 1346   Followed by  . [START ON 12/30/2023] LORazepam  (ATIVAN ) tablet 0-4 mg  0-4 mg Oral Q12H Nicholaus Brad RAMAN, NP      . magnesium  hydroxide (MILK OF MAGNESIA) suspension 30 mL  30 mL Oral Daily PRN Penn, Cicely, NP      . melatonin tablet 5 mg  5 mg Oral QHS Roisin Mones S, NP   5 mg at 12/28/23 2129  . multivitamin with minerals tablet 1 tablet  1 tablet Oral Daily Nicholaus Brad RAMAN, NP   1 tablet at 12/29/23 9144  . nicotine  (NICODERM CQ  - dosed in mg/24 hours) patch 14 mg  14 mg Transdermal Daily Nicholaus Brad RAMAN, NP      . thiamine  (VITAMIN B1) tablet 100 mg  100 mg Oral Daily Nicholaus Brad RAMAN, NP   100 mg at 12/29/23 9143   Or  . thiamine  (VITAMIN B1) injection 100 mg  100 mg Intravenous Daily Nicholaus Brad RAMAN, NP      . traZODone  (DESYREL ) tablet 50 mg  50 mg Oral QHS PRN Sammy Reymundo, NP   50 mg at 12/28/23 2129    Lab Results:  Results for orders placed or performed during the hospital encounter  of 12/27/23 (from the past 48 hours)  Lipid panel     Status: Abnormal   Collection Time: 12/29/23 11:33 AM  Result Value Ref Range   Cholesterol 155 0 - 200 mg/dL   Triglycerides 885 <849 mg/dL   HDL 31 (L) >59 mg/dL  Total CHOL/HDL Ratio 5.0 RATIO   VLDL 23 0 - 40 mg/dL   LDL Cholesterol 898 (H) 0 - 99 mg/dL    Comment:        Total Cholesterol/HDL:CHD Risk Coronary Heart Disease Risk Table                     Men   Women  1/2 Average Risk   3.4   3.3  Average Risk       5.0   4.4  2 X Average Risk   9.6   7.1  3 X Average Risk  23.4   11.0        Use the calculated Patient Ratio above and the CHD Risk Table to determine the patient's CHD Risk.        ATP III CLASSIFICATION (LDL):  <100     mg/dL   Optimal  899-870  mg/dL   Near or Above                    Optimal  130-159  mg/dL   Borderline  839-810  mg/dL   High  >809     mg/dL   Very High Performed at [facility], [address] [place], [place] [address]   Hemoglobin A1c     Status: Abnormal   Collection Time: 12/29/23 11:33 AM  Result Value Ref Range   Hgb A1c MFr Bld 6.1 (H) 4.8 - 5.6 %    Comment: (NOTE) Pre diabetes:          5.7%-6.4%  Diabetes:              >6.4%  Glycemic control for   <7.0% adults with diabetes    Mean Plasma Glucose 128.37 mg/dL    Comment: Performed at [facility], [address], [place], [place] [address]    Blood Alcohol  level:  Lab Results  Component Value Date   ETH <10 12/27/2023    Metabolic Disorder Labs: Lab Results  Component Value Date   HGBA1C 6.1 (H) 12/29/2023   MPG 128.37 12/29/2023   No results found for: PROLACTIN Lab Results  Component Value Date   CHOL 155 12/29/2023   TRIG 114 12/29/2023   HDL 31 (L) 12/29/2023   CHOLHDL 5.0 12/29/2023   VLDL 23 12/29/2023   LDLCALC 101 (H) 12/29/2023    Physical Findings: AIMS:  , ,  ,  ,    CIWA:  CIWA-Ar Total: 4 COWS:     Musculoskeletal: Strength & Muscle Tone: within normal  limits Gait & Station: normal Patient leans: N/A  Psychiatric Specialty Exam:  Presentation  General Appearance:  Disheveled  Eye Contact: Minimal  Speech: Slow; Clear and Coherent  Speech Volume: Decreased  Handedness: Right   Mood and Affect  Mood: Hopeless (helpless.)  Affect: Flat; Depressed; Congruent   Thought Process  Thought Processes: Linear; Goal Directed (feelings of hopelessness.)  Descriptions of Associations:Intact  Orientation:Full (Time, Place and Person)  Thought Content:Rumination (No evidence of grandiosity or obsessive thoughts.)  History of Schizophrenia/Schizoaffective disorder:No  Duration of Psychotic Symptoms:No data recorded Hallucinations:Hallucinations: None  Ideas of Reference:Paranoia  Suicidal Thoughts:Suicidal Thoughts: Yes, Passive SI Passive Intent and/or Plan: Without Intent; With Plan; Without Means to Carry Out; Without Access to Means (a plan to hang himself.)  Homicidal Thoughts:Homicidal Thoughts: No   Sensorium  Memory: Immediate Good; Remote Good  Judgment: Impaired (Engaged in unsafe behaviors, such as using Xanax  obtained from  his son and intermittent use of non-prescribed Suboxone . Struggles with managing stress and suicidal ideation effectively.)  Insight: Shallow (cknowledges the need for help but has difficulty recognizing the impact of medication nonadherence and substance misuse.)   Executive Functions  Concentration: Fair  Attention Span: Fair  Recall: Good  Fund of Knowledge: Good  Language: Good   Psychomotor Activity  Psychomotor Activity: Psychomotor Activity: Normal   Assets  Assets: Communication Skills; Housing   Sleep  Sleep: Sleep: Poor Number of Hours of Sleep: 2    Physical Exam: Physical Exam Vitals and nursing note reviewed.  Constitutional:      Appearance: Normal appearance.  HENT:     Head: Normocephalic and atraumatic.     Nose: Nose normal.   Pulmonary:     Effort: Pulmonary effort is normal.  Musculoskeletal:        General: Normal range of motion.     Cervical back: Normal range of motion.  Neurological:     General: No focal deficit present.     Mental Status: He is alert. Mental status is at baseline.  Psychiatric:        Attention and Perception: Attention and perception normal.        Mood and Affect: Mood normal. Affect is blunt and flat.        Speech: Speech normal.        Behavior: Behavior is withdrawn. Behavior is cooperative.        Thought Content: Thought content normal.        Cognition and Memory: Cognition and memory normal.        Judgment: Judgment normal.   Review of Systems  Psychiatric/Behavioral:  Positive for depression and substance abuse.   All other systems reviewed and are negative.  Blood pressure 100/73, pulse 90, temperature 98.8 F (37.1 C), resp. rate 16, height 5' 7 (1.702 m), weight 51.3 kg, SpO2 93%. Body mass index is 17.7 kg/m.   Treatment Plan Summary: Daily contact with patient to assess and evaluate symptoms and progress in treatment and Medication management Lexapro  (Escitalopram ): Restart at 10 mg daily, with plans to titrate to 20 mg daily as tolerated. Melatonin: 3 mg nightly to assist with sleep. Ativan  (Lorazepam ): Administer per CIWA protocol for benzodiazepine misuse. Suboxone : Not ordered due to inconsistent use and lack of recent prescription (18 months). Chest X-ray: Evaluate nonproductive cough to rule out infections or exacerbation of COPD. Hospitalist Consultation: Address physical health concerns, including cough and potential respiratory issues. Educate the patient on the risks of obtaining Suboxone  and Xanax  from nonmedical sources. Brad GORMAN Moats, NP 12/29/2023, 6:01 PM
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
## 2023-12-29 NOTE — Progress Notes (Signed)
   12/28/23 1943  Psych Admission Type (Psych Patients Only)  Admission Status Voluntary  Psychosocial Assessment  Patient Complaints Anxiety;Depression  Eye Contact Poor  Facial Expression Flat  Affect Flat  Speech Slow  Interaction Minimal  Motor Activity Slow  Appearance/Hygiene In scrubs  Behavior Characteristics Guarded  Mood Depressed  Aggressive Behavior  Effect No apparent injury  Thought Process  Coherency WDL  Content WDL  Delusions None reported or observed  Perception WDL  Hallucination None reported or observed  Judgment WDL  Confusion WDL  Danger to Self  Current suicidal ideation? Active  Agreement Not to Harm Self Yes  Description of Agreement verbal  Danger to Others  Danger to Others None reported or observed

## 2023-12-29 NOTE — Plan of Care (Signed)
   Problem: Education: Goal: Emotional status will improve Outcome: Progressing Goal: Mental status will improve Outcome: Progressing Goal: Verbalization of understanding the information provided will improve Outcome: Progressing

## 2023-12-29 NOTE — Group Note (Unsigned)
 Date:  12/29/2023 Time:  9:06 PM  Group Topic/Focus:  Making Healthy Choices:   The focus of this group is to help patients identify negative/unhealthy choices they were using prior to admission and identify positive/healthier coping strategies to replace them upon discharge.     Participation Level:  {BHH PARTICIPATION OZCZO:77735}  Participation Quality:  {BHH PARTICIPATION QUALITY:22265}  Affect:  {BHH AFFECT:22266}  Cognitive:  {BHH COGNITIVE:22267}  Insight: {BHH Insight2:20797}  Engagement in Group:  {BHH ENGAGEMENT IN HMNLE:77731}  Modes of Intervention:  {BHH MODES OF INTERVENTION:22269}  Additional Comments:  ***  [name] 12/29/2023, 9:06 PM

## 2023-12-30 ENCOUNTER — Inpatient Hospital Stay: Payer: Medicaid Other

## 2023-12-30 DIAGNOSIS — H532 Diplopia: Secondary | ICD-10-CM

## 2023-12-30 DIAGNOSIS — J441 Chronic obstructive pulmonary disease with (acute) exacerbation: Secondary | ICD-10-CM

## 2023-12-30 DIAGNOSIS — F331 Major depressive disorder, recurrent, moderate: Secondary | ICD-10-CM | POA: Diagnosis not present

## 2023-12-30 MED ORDER — MOMETASONE FURO-FORMOTEROL FUM 100-5 MCG/ACT IN AERO: 2.0000 | INHALATION_SPRAY | Freq: Two times a day (BID) | RESPIRATORY_TRACT | 1 refills | Status: DC

## 2023-12-30 MED ORDER — PREDNISONE 10 MG PO TABS
40.0000 mg | ORAL_TABLET | Freq: Every day | ORAL | Status: DC
  Administered 2023-12-31 – 2024-01-03 (×4): 40 mg via ORAL
  Filled 2023-12-30 (×5): qty 4

## 2023-12-30 MED ORDER — ALBUTEROL SULFATE HFA 108 (90 BASE) MCG/ACT IN AERS
2.0000 | INHALATION_SPRAY | Freq: Four times a day (QID) | RESPIRATORY_TRACT | Status: DC
  Administered 2023-12-30 – 2024-01-06 (×11): 2 via RESPIRATORY_TRACT
  Filled 2023-12-30: qty 6.7

## 2023-12-30 MED ORDER — MOMETASONE FURO-FORMOTEROL FUM 100-5 MCG/ACT IN AERO
2.0000 | INHALATION_SPRAY | Freq: Two times a day (BID) | RESPIRATORY_TRACT | Status: DC
  Administered 2023-12-30 – 2024-01-03 (×5): 2 via RESPIRATORY_TRACT
  Filled 2023-12-30: qty 8.8

## 2023-12-30 MED ORDER — ASPIRIN 81 MG PO TBEC
81.0000 mg | DELAYED_RELEASE_TABLET | Freq: Every day | ORAL | Status: DC
  Administered 2023-12-30 – 2024-01-07 (×9): 81 mg via ORAL
  Filled 2023-12-30 (×9): qty 1

## 2023-12-30 MED ORDER — BENZONATATE 100 MG PO CAPS
100.0000 mg | ORAL_CAPSULE | Freq: Three times a day (TID) | ORAL | Status: DC | PRN
  Administered 2024-01-01 – 2024-01-03 (×2): 100 mg via ORAL
  Filled 2023-12-30 (×3): qty 1

## 2023-12-30 MED ORDER — GUAIFENESIN 100 MG/5ML PO LIQD
5.0000 mL | Freq: Four times a day (QID) | ORAL | Status: DC | PRN
  Administered 2023-12-31: 5 mL via ORAL
  Filled 2023-12-30: qty 10

## 2023-12-30 MED ORDER — ALBUTEROL SULFATE HFA 108 (90 BASE) MCG/ACT IN AERS: 2.0000 | INHALATION_SPRAY | Freq: Four times a day (QID) | RESPIRATORY_TRACT | 2 refills | Status: DC | PRN

## 2023-12-30 NOTE — Plan of Care (Signed)
   Problem: Education: Goal: Emotional status will improve Outcome: Progressing   Problem: Education: Goal: Mental status will improve Outcome: Progressing

## 2023-12-30 NOTE — Progress Notes (Signed)
   12/30/23 2300  Psych Admission Type (Psych Patients Only)  Admission Status Voluntary  Psychosocial Assessment  Patient Complaints Depression  Eye Contact Fair  Facial Expression Flat  Affect Anxious  Speech Logical/coherent  Interaction Minimal  Motor Activity Slow  Appearance/Hygiene In scrubs  Behavior Characteristics Anxious;Fidgety  Thought Process  Coherency WDL  Content WDL  Delusions None reported or observed  Perception WDL  Hallucination None reported or observed  Judgment Limited  Confusion Mild  Danger to Self  Current suicidal ideation? Denies  Self-Injurious Behavior No self-injurious ideation or behavior indicators observed or expressed   Agreement Not to Harm Self Yes  Description of Agreement Verbal  Danger to Others  Danger to Others None reported or observed

## 2023-12-30 NOTE — Group Note (Signed)
 Date:  12/30/2023 Time:  11:05 AM  Group Topic/Focus:  Dimensions of Wellness:   The focus of this group is to introduce the topic of wellness and discuss the role each dimension of wellness plays in total health. Identifying Needs:   The focus of this group is to help patients identify their personal needs that have been historically problematic and identify healthy behaviors to address their needs.    Participation Level:  Active  Participation Quality:  Appropriate  Affect:  Appropriate  Cognitive:  Appropriate  Insight: Appropriate  Engagement in Group:  Developing/Improving and Engaged  Modes of Intervention:  Activity  Additional Comments:    Corey House 12/30/2023, 11:05 AM

## 2023-12-30 NOTE — Plan of Care (Signed)

## 2023-12-30 NOTE — Plan of Care (Signed)
  Problem: Education: Goal: Knowledge of [facility] Education information/materials will improve 12/30/2023 0701 by Grayson Odella LABOR, RN Outcome: Progressing 12/30/2023 0700 by Grayson Odella LABOR, RN Outcome: Progressing Goal: Emotional status will improve 12/30/2023 0701 by Grayson Odella LABOR, RN Outcome: Progressing 12/30/2023 0700 by Grayson Odella LABOR, RN Outcome: Progressing Goal: Mental status will improve 12/30/2023 0701 by Grayson Odella LABOR, RN Outcome: Progressing 12/30/2023 0700 by Grayson Odella LABOR, RN Outcome: Progressing Goal: Verbalization of understanding the information provided will improve 12/30/2023 0701 by Grayson Odella LABOR, RN Outcome: Progressing 12/30/2023 0700 by Grayson Odella LABOR, RN Outcome: Progressing   Problem: Activity: Goal: Interest or engagement in activities will improve 12/30/2023 0701 by Grayson Odella LABOR, RN Outcome: Progressing 12/30/2023 0700 by Grayson Odella LABOR, RN Outcome: Progressing Goal: Sleeping patterns will improve 12/30/2023 0701 by Grayson Odella LABOR, RN Outcome: Progressing 12/30/2023 0700 by Grayson Odella LABOR, RN Outcome: Progressing   Problem: Coping: Goal: Ability to verbalize frustrations and anger appropriately will improve 12/30/2023 0701 by Grayson Odella LABOR, RN Outcome: Progressing 12/30/2023 0700 by Grayson Odella LABOR, RN Outcome: Progressing Goal: Ability to demonstrate self-control will improve 12/30/2023 0701 by Grayson Odella LABOR, RN Outcome: Progressing 12/30/2023 0700 by Grayson Odella LABOR, RN Outcome: Progressing   Problem: Health Behavior/Discharge Planning: Goal: Identification of resources available to assist in meeting health care needs will improve 12/30/2023 0701 by Grayson Odella LABOR, RN Outcome: Progressing 12/30/2023 0700 by Grayson Odella LABOR, RN Outcome: Progressing Goal: Compliance with treatment plan for underlying cause of condition will improve 12/30/2023 0701 by Grayson Odella LABOR, RN Outcome: Progressing 12/30/2023 0700 by Grayson Odella LABOR, RN Outcome: Progressing   Problem: Physical Regulation: Goal: Ability to maintain clinical measurements within normal limits will improve 12/30/2023 0701 by Grayson Odella LABOR, RN Outcome: Progressing 12/30/2023 0700 by Grayson Odella LABOR, RN Outcome: Progressing   Problem: Safety: Goal: Periods of time without injury will increase 12/30/2023 0701 by Grayson Odella LABOR, RN Outcome: Progressing 12/30/2023 0700 by Grayson Odella LABOR, RN Outcome: Progressing

## 2023-12-30 NOTE — Progress Notes (Signed)
 [facility] MD Progress Note  12/30/2023 3:39 PM Vinie ORN [name].  MRN:  [id number] Subjective:  58 year old Caucasian male, reports, I am doing better, but I am having occasional double vision. He also mentions a non-productive cough but denies loss of consciousness (LOC). He expresses a willingness to engage in treatment and attend group therapy sessions. Principal Problem: Major depressive disorder, recurrent episode, moderate (HCC) Diagnosis: Principal Problem:   Major depressive disorder, recurrent episode, moderate (HCC) Active Problems:   Benzodiazepine abuse (HCC)   Suicidal ideations  Total Time spent with patient: 1.5 hours  Past Psychiatric History: MDD   Past Medical History:  Past Medical History:  Diagnosis Date   Chronic cough    Urolithiasis    Urolithiasis     Past Surgical History:  Procedure Laterality Date   CATARACT EXTRACTION W/PHACO Left 08/09/2021   Procedure: CATARACT EXTRACTION PHACO AND INTRAOCULAR LENS PLACEMENT (IOC) LEFT .051 00:10.0;  Surgeon: Mittie Gaskin, MD;  Location: [facility];  Service: Ophthalmology;  Laterality: Left;   CATARACT EXTRACTION W/PHACO Right 10/04/2021   Procedure: CATARACT EXTRACTION PHACO AND INTRAOCULAR LENS PLACEMENT (IOC) RIGHT 0.67 00:13.8;  Surgeon: Mittie Gaskin, MD;  Location: [facility];  Service: Ophthalmology;  Laterality: Right;  general anesthesia needs to be last case   HERNIA REPAIR     x2   MANDIBLE FRACTURE SURGERY     Family History: History reviewed. No pertinent family history. Family Psychiatric  History: none reported Social History:  Social History   Substance and Sexual Activity  Alcohol  Use No     Social History   Substance and Sexual Activity  Drug Use Not on file    Social History   Socioeconomic History   Marital status: Single    Spouse name: Not on file   Number of children: Not on file   Years of education: Not on file   Highest education level: Not on  file  Occupational History   Not on file  Tobacco Use   Smoking status: Every Day    Current packs/day: 1.00    Average packs/day: 1 pack/day for 35.0 years (35.0 ttl pk-yrs)    Types: Cigarettes   Smokeless tobacco: Never   Tobacco comments:    Started smoking age 42  Vaping Use   Vaping status: Never Used  Substance and Sexual Activity   Alcohol  use: No   Drug use: Not on file   Sexual activity: Not on file  Other Topics Concern   Not on file  Social History Narrative   Not on file   Social Drivers of Health   Financial Resource Strain: Not on file  Food Insecurity: Patient Declined (12/27/2023)   Hunger Vital Sign    Worried About Running Out of Food in the Last Year: Patient declined    Ran Out of Food in the Last Year: Patient declined  Recent Concern: Food Insecurity - Food Insecurity Present (12/27/2023)   Hunger Vital Sign    Worried About Running Out of Food in the Last Year: Patient declined    Ran Out of Food in the Last Year: Sometimes true  Transportation Needs: Patient Declined (12/27/2023)   PRAPARE - Administrator, Civil Service (Medical): Patient declined    Lack of Transportation (Non-Medical): Patient declined  Physical Activity: Not on file  Stress: Not on file  Social Connections: Not on file   Additional Social History:  Sleep: Fair  Appetite:  Good  Current Medications: Current Facility-Administered Medications  Medication Dose Route Frequency Provider Last Rate Last Admin   acetaminophen  (TYLENOL ) tablet 650 mg  650 mg Oral Q6H PRN Penn, Reymundo, NP   650 mg at 12/29/23 1348   albuterol  (VENTOLIN  HFA) 108 (90 Base) MCG/ACT inhaler 2 puff  2 puff Inhalation Q6H Laurita Cort DASEN, MD       alum & mag hydroxide-simeth (MAALOX/MYLANTA) 200-200-20 MG/5ML suspension 30 mL  30 mL Oral Q4H PRN Penn, Reymundo, NP       aspirin  EC tablet 81 mg  81 mg Oral Daily Laurita Cort T, MD       benzonatate  (TESSALON )  capsule 100 mg  100 mg Oral TID PRN Laurita Cort DASEN, MD       haloperidol  (HALDOL ) tablet 5 mg  5 mg Oral TID PRN Sammy Reymundo, NP       And   diphenhydrAMINE  (BENADRYL ) capsule 50 mg  50 mg Oral TID PRN Sammy Reymundo, NP       escitalopram  (LEXAPRO ) tablet 20 mg  20 mg Oral Daily Nicholaus Brad RAMAN, NP   20 mg at 12/30/23 0904   feeding supplement (ENSURE ENLIVE / ENSURE PLUS) liquid 237 mL  237 mL Oral BID BM Kimanh Templeman S, NP   237 mL at 12/30/23 1412   folic acid  (FOLVITE ) tablet 1 mg  1 mg Oral Daily Jacquilyn Seldon S, NP   1 mg at 12/30/23 9094   guaiFENesin  (ROBITUSSIN) 100 MG/5ML liquid 5 mL  5 mL Oral Q6H PRN Laurita Cort DASEN, MD       hydrOXYzine  (ATARAX ) tablet 25 mg  25 mg Oral TID PRN Sammy Reymundo, NP   25 mg at 12/28/23 1335   LORazepam  (ATIVAN ) tablet 1-4 mg  1-4 mg Oral Q1H PRN Nuel Dejaynes S, NP       Or   LORazepam  (ATIVAN ) injection 1-4 mg  1-4 mg Intravenous Q1H PRN Hayden Mabin S, NP       LORazepam  (ATIVAN ) tablet 0-4 mg  0-4 mg Oral Q12H Nicholaus Brad RAMAN, NP       magnesium  hydroxide (MILK OF MAGNESIA) suspension 30 mL  30 mL Oral Daily PRN Penn, Reymundo, NP       melatonin tablet 5 mg  5 mg Oral QHS Aneeka Bowden S, NP   5 mg at 12/29/23 2104   mometasone -formoterol  (DULERA ) 100-5 MCG/ACT inhaler 2 puff  2 puff Inhalation BID Laurita Cort T, MD       multivitamin with minerals tablet 1 tablet  1 tablet Oral Daily Nicholaus Brad RAMAN, NP   1 tablet at 12/30/23 9095   nicotine  (NICODERM CQ  - dosed in mg/24 hours) patch 14 mg  14 mg Transdermal Daily Nicholaus Brad RAMAN, NP   14 mg at 12/30/23 9094   predniSONE  (DELTASONE ) tablet 40 mg  40 mg Oral Q breakfast Laurita Cort T, MD       thiamine  (VITAMIN B1) tablet 100 mg  100 mg Oral Daily Nicholaus Brad RAMAN, NP   100 mg at 12/30/23 9094   Or   thiamine  (VITAMIN B1) injection 100 mg  100 mg Intravenous Daily Nicholaus Brad RAMAN, NP       traZODone  (DESYREL ) tablet 50 mg  50 mg Oral QHS PRN Sammy Reymundo, NP   50 mg at 12/29/23 2104    Lab Results:  Results for orders  placed or performed during the hospital encounter of 12/27/23 (from the  past 48 hours)  Lipid panel     Status: Abnormal   Collection Time: 12/29/23 11:33 AM  Result Value Ref Range   Cholesterol 155 0 - 200 mg/dL   Triglycerides 885 <849 mg/dL   HDL 31 (L) >59 mg/dL   Total CHOL/HDL Ratio 5.0 RATIO   VLDL 23 0 - 40 mg/dL   LDL Cholesterol 898 (H) 0 - 99 mg/dL    Comment:        Total Cholesterol/HDL:CHD Risk Coronary Heart Disease Risk Table                     Men   Women  1/2 Average Risk   3.4   3.3  Average Risk       5.0   4.4  2 X Average Risk   9.6   7.1  3 X Average Risk  23.4   11.0        Use the calculated Patient Ratio above and the CHD Risk Table to determine the patient's CHD Risk.        ATP III CLASSIFICATION (LDL):  <100     mg/dL   Optimal  899-870  mg/dL   Near or Above                    Optimal  130-159  mg/dL   Borderline  839-810  mg/dL   High  >809     mg/dL   Very High Performed at [facility], [address] [place], [place] [address]   Hemoglobin A1c     Status: Abnormal   Collection Time: 12/29/23 11:33 AM  Result Value Ref Range   Hgb A1c MFr Bld 6.1 (H) 4.8 - 5.6 %    Comment: (NOTE) Pre diabetes:          5.7%-6.4%  Diabetes:              >6.4%  Glycemic control for   <7.0% adults with diabetes    Mean Plasma Glucose 128.37 mg/dL    Comment: Performed at [facility], [address] [place], [place] [address]    Blood Alcohol  level:  Lab Results  Component Value Date   ETH <10 12/27/2023    Metabolic Disorder Labs: Lab Results  Component Value Date   HGBA1C 6.1 (H) 12/29/2023   MPG 128.37 12/29/2023   No results found for: PROLACTIN Lab Results  Component Value Date   CHOL 155 12/29/2023   TRIG 114 12/29/2023   HDL 31 (L) 12/29/2023   CHOLHDL 5.0 12/29/2023   VLDL 23 12/29/2023   LDLCALC 101 (H) 12/29/2023    Physical Findings: AIMS:  , ,  ,  ,    CIWA:  CIWA-Ar Total: 4 COWS:      Musculoskeletal: Strength & Muscle Tone: within normal limits Gait & Station: normal Patient leans: N/A  Psychiatric Specialty Exam:  Presentation  General Appearance:  Fairly Groomed; Appropriate for Environment  Eye Contact: Minimal  Speech: Clear and Coherent  Speech Volume: Decreased  Handedness: Right   Mood and Affect  Mood: Anxious; Depressed (engaging appropriately during the session. Prefers staying in bed but interacts well with staff and peers when prompted.)  Affect: Congruent (mildly restricted.)   Thought Process  Thought Processes: Coherent (No delusions, obsessions, or intrusive thoughts reported or observed.)  Descriptions of Associations:Intact  Orientation:Full (Time, Place and Person)  Thought Content:Rumination; WDL  History of Schizophrenia/Schizoaffective disorder:No  Duration of  Psychotic Symptoms:No data recorded Hallucinations:Hallucinations: None  Ideas of Reference:None  Suicidal Thoughts:Suicidal Thoughts: No SI Passive Intent and/or Plan: -- (denies)  Homicidal Thoughts:Homicidal Thoughts: No   Sensorium  Memory: Immediate Good; Remote Good  Judgment: Fair (understands his condition and adheres to the treatment plan)  Insight: Fair (decisions appear thoughtful and appropriate within the context of treatment.)   Executive Functions  Concentration: Fair  Attention Span: Fair  Recall: Good  Fund of Knowledge: Good  Language: Good   Psychomotor Activity  Psychomotor Activity: Psychomotor Activity: Normal   Assets  Assets: Communication Skills; Housing   Sleep  Sleep: Sleep: Fair Number of Hours of Sleep: 6    Physical Exam: Physical Exam Vitals and nursing note reviewed.  Constitutional:      Appearance: Normal appearance.  HENT:     Head: Normocephalic and atraumatic.     Nose: Nose normal.  Pulmonary:     Effort: Pulmonary effort is normal.  Musculoskeletal:        General:  Normal range of motion.     Cervical back: Normal range of motion.  Neurological:     General: No focal deficit present.     Mental Status: He is alert and oriented to person, place, and time. Mental status is at baseline.  Psychiatric:        Attention and Perception: Attention and perception normal.        Mood and Affect: Mood is anxious. Affect is flat.        Speech: Speech normal.        Behavior: Behavior normal. Behavior is cooperative.        Thought Content: Thought content normal.        Cognition and Memory: Cognition and memory normal.        Judgment: Judgment normal.    Review of Systems  Respiratory:  Positive for cough.   Psychiatric/Behavioral:  The patient is nervous/anxious and has insomnia.   All other systems reviewed and are negative.  Blood pressure 102/78, pulse 68, temperature 98.8 F (37.1 C), resp. rate 16, height 5' 7 (1.702 m), weight 51.3 kg, SpO2 93%. Body mass index is 17.7 kg/m.   Treatment Plan Summary: Daily contact with patient to assess and evaluate symptoms and progress in treatment and Medication management Lexapro  (Escitalopram ): 20 mg daily as tolerated. Melatonin: 3 mg nightly to assist with sleep. Ativan  (Lorazepam ): Administer per CIWA protocol for benzodiazepine misuse.  Consult the medical hospitalist to evaluate the cause of double vision and the non-productive cough. Rule out potential medication side effects, neurological causes, or other medical conditions. Encourage participation in group therapy to enhance social engagement and support for mental health Suboxone : Not ordered due to inconsistent use and lack of recent prescription (18 months).  Brad GORMAN Moats, NP 12/30/2023, 3:39 PM

## 2023-12-30 NOTE — Group Note (Signed)
 Recreation Therapy Group Note   Group Topic:Healthy Support Systems  Group Date: 12/30/2023 Start Time: 1010 End Time: 1110 Facilitators: Celestia Jeoffrey BRAVO, LRT, CTRS Location:  Craft Room  Group Description: Straw Bridge. In groups or individually, patients were given 10 plastic drinking straws and an equal length of masking tape. Using the materials provided, patients were instructed to build a free-standing bridge-like structure to suspend an everyday item (ex: deck of cards) off the floor or table surface. All materials were required to be used in secondary school teacher. LRT facilitated post-activity discussion reviewing the importance of having strong and healthy support systems in our lives. LRT discussed how the people in our lives serve as the tape and the deck of cards we placed on top of our straw structure are the stressors we face in daily life. LRT and pts discussed what happens in our life when things get too heavy for us , and we don't have strong supports outside of the hospital. Pt shared 2 of their healthy supports in their life aloud in the group.   Goal Area(s) Addressed:  Patient will identify 2 healthy supports in their life. Patient will identify skills to successfully complete activity. Patient will identify correlation of this activity to life post-discharge.  Patient will build on frustration tolerance skills. Patient will increase team building and communication skills   Affect/Mood: N/A   Participation Level: Did not attend    Clinical Observations/Individualized Feedback: Patient did not attend group.   Plan: Continue to engage patient in RT group sessions 2-3x/week.   Jeoffrey BRAVO Celestia, LRT, CTRS 12/30/2023 12:18 PM

## 2023-12-30 NOTE — Progress Notes (Signed)
 Assumed care of patient this pm, he presented  A&O x 3, affect & mood is sad and depressed and he was seclusive to his room. milieu, . Pt c/o having Suboxone  withdrawal and frequently requested Ativan  and was administered as ordered. Pt also c/o double vision  and provider consulted with the hospitalist  and ordered images and respiratory medications. Pt denied thoughts, plan or intent to harm self or others and made a safety commitment, Pt educated on plan of care, medication regimen and she/he acknowledged an understanding and was without further complaints or concerns. Pt is been maintained on q 15 min rounds.

## 2023-12-30 NOTE — Consult Note (Signed)
 Initial Consultation Note   Patient: Corey House. FMW:[id number] DOB: 1966/12/11 PCP: Patient, [name] DOA: 12/27/2023 DOS: the patient was seen and examined on 12/30/2023 Primary service: Victoria Ruts, MD  Referring physician: Dr. Victoria Reason for consult: Vision problems, cough, SOB  Assessment/Plan:  Diplopia -Transient and only on left eye, suspect TIA -MRI to rule out stroke. IF MRI positive, will consider transfer to Medical floor for stroke workup. As of now, patient can stay in psych unit -Start ASA -Other Ddx, patient reported that symptoms started after he was started on Lexapro  yesterday.  Literature showed no such side effect associated with SSRI.  Acute COPD exacerbation, mild -P.o. steroid x 5 days -Start albuterol , LABA plus ICS. Send prescription to [facility] -Incentive spirometry -Guaifenesin  and Tessalon  -Outpatient pulmonary follow-up for lung function test  Major depression -As per primary team  Alcohol  abuse -No symptoms signs of alcohol  withdrawal.   TRH will continue to follow the patient.  HPI: Corey House. is a 58 y.o. male with past medical history of COPD, anxiety/depression, reported that he started to have episode of double vision on left eye since this morning.  Psychiatry nurse practitioner reported that patient was started on Lexapro  yesterday and this morning, at breakfast table, patient started to have double vision on left eye, denied any headache hearing changes numbness weakness of any of the limbs.  He went back to bed and few hours later he woke up and double vision disappeared.  Denied any eye pain blurry vision, tearing.  In addition, patient reported has been having dry cough for 1 week, with occasional wheezing.  This week has been feeling exertional dyspnea, ambulating back-and-forth from bed to bathroom close shortness of breath.  Denied any swelling, no fever chills no chest pains.  Review of Systems: As mentioned in  the history of present illness. All other systems reviewed and are negative. Past Medical History:  Diagnosis Date   Chronic cough    Urolithiasis    Urolithiasis    Past Surgical History:  Procedure Laterality Date   CATARACT EXTRACTION W/PHACO Left 08/09/2021   Procedure: CATARACT EXTRACTION PHACO AND INTRAOCULAR LENS PLACEMENT (IOC) LEFT .051 00:10.0;  Surgeon: Mittie Gaskin, MD;  Location: [facility];  Service: Ophthalmology;  Laterality: Left;   CATARACT EXTRACTION W/PHACO Right 10/04/2021   Procedure: CATARACT EXTRACTION PHACO AND INTRAOCULAR LENS PLACEMENT (IOC) RIGHT 0.67 00:13.8;  Surgeon: Mittie Gaskin, MD;  Location: [facility];  Service: Ophthalmology;  Laterality: Right;  general anesthesia needs to be last case   HERNIA REPAIR     x2   MANDIBLE FRACTURE SURGERY     Social History:  reports that he has been smoking cigarettes. He has a 35 pack-year smoking history. He has never used smokeless tobacco. He reports that he does not drink alcohol . No history on file for drug use.  No Known Allergies  History reviewed. No pertinent family history.  Prior to Admission medications   Medication Sig Start Date End Date Taking? Authorizing Provider  escitalopram  (LEXAPRO ) 10 MG tablet Take 1 tablet (10 mg total) by mouth daily. 01/07/23 04/07/23  Awanda City, MD  feeding supplement (ENSURE ENLIVE / ENSURE PLUS) LIQD Take 237 mLs by mouth 3 (three) times daily between meals. 01/07/23   Awanda City, MD  Multiple Vitamin (MULTIVITAMIN WITH MINERALS) TABS tablet Take 1 tablet by mouth daily. Patient not taking: Reported on 12/27/2023 01/08/23   Awanda City, MD  nicotine  (NICODERM CQ  - DOSED  IN MG/24 HOURS) 14 mg/24hr patch Place 1 patch (14 mg total) onto the skin daily. Patient not taking: Reported on 12/27/2023 01/07/23   Awanda City, MD    Physical Exam: Vitals:   12/29/23 1031 12/29/23 1628 12/29/23 1740 12/30/23 0621  BP: 103/83 110/86 100/73 102/78  Pulse:  80 75 90 68  Resp:      Temp:   98.8 F (37.1 C) 98.8 F (37.1 C)  TempSrc:      SpO2: 90% 92% 93% 93%  Weight:      Height:       Eyes: PERRL, lids and conjunctivae normal ENMT: Mucous membranes are moist. Posterior pharynx clear of any exudate or lesions.Normal dentition.  Neck: normal, supple, no masses, no thyromegaly Respiratory:  bilaterally, no wheezing, no crackles. Normal respiratory effort. No accessory muscle use.  Cardiovascular: Regular rate and rhythm, no murmurs / rubs / gallops. No extremity edema. 2+ pedal pulses. No carotid bruits.  Abdomen: no tenderness, no masses palpated. No hepatosplenomegaly. Bowel sounds positive.  Musculoskeletal: no clubbing / cyanosis. No joint deformity upper and lower extremities. Good ROM, no contractures. Normal muscle tone.  Skin: no rashes, lesions, ulcers. No induration Neurologic: CN 2-12 grossly intact. Sensation intact, DTR normal.  Muscle strength 5/5 on both sides Psychiatric: Normal judgment and insight. Alert and oriented x 3. Normal mood.    Data Reviewed:  CBC BMP and chest x-ray from today reviewed   Family Communication: None at bedside Primary team communication: Psychiatry team Thank you very much for involving us  in the care of your patient.  Author: Cort ONEIDA Mana, MD 12/30/2023 3:35 PM  For on call review [email or web address].

## 2023-12-30 NOTE — Group Note (Signed)
 [facility] LCSW Group Therapy Note    Group Date: 12/30/2023 Start Time: 1330 End Time: 1430  Type of Therapy and Topic:  Group Therapy:  Overcoming Obstacles  Participation Level:  [facility] PARTICIPATION LEVEL: Did Not Attend  Mood:  Description of Group:   In this group patients will be encouraged to explore what they see as obstacles to their own wellness and recovery. They will be guided to discuss their thoughts, feelings, and behaviors related to these obstacles. The group will process together ways to cope with barriers, with attention given to specific choices patients can make. Each patient will be challenged to identify changes they are motivated to make in order to overcome their obstacles. This group will be process-oriented, with patients participating in exploration of their own experiences as well as giving and receiving support and challenge from other group members.  Therapeutic Goals: 1. Patient will identify personal and current obstacles as they relate to admission. 2. Patient will identify barriers that currently interfere with their wellness or overcoming obstacles.  3. Patient will identify feelings, thought process and behaviors related to these barriers. 4. Patient will identify two changes they are willing to make to overcome these obstacles:    Summary of Patient Progress Patient did not attend group.   Therapeutic Modalities:   Cognitive Behavioral Therapy Solution Focused Therapy Motivational Interviewing Relapse Prevention Therapy   Sherryle JINNY Margo, LCSW

## 2023-12-30 NOTE — BH IP Treatment Plan (Signed)
 Interdisciplinary Treatment and Diagnostic Plan Update  12/30/2023 Time of Session: 9:19AM Corey House. MRN: [id number]  Principal Diagnosis: Major depressive disorder, recurrent episode, moderate (HCC)  Secondary Diagnoses: Principal Problem:   Major depressive disorder, recurrent episode, moderate (HCC) Active Problems:   Benzodiazepine abuse (HCC)   Suicidal ideations   Current Medications:  Current Facility-Administered Medications  Medication Dose Route Frequency Provider Last Rate Last Admin   acetaminophen  (TYLENOL ) tablet 650 mg  650 mg Oral Q6H PRN Penn, Reymundo, NP   650 mg at 12/29/23 1348   alum & mag hydroxide-simeth (MAALOX/MYLANTA) 200-200-20 MG/5ML suspension 30 mL  30 mL Oral Q4H PRN Penn, Cicely, NP       haloperidol  (HALDOL ) tablet 5 mg  5 mg Oral TID PRN Sammy Reymundo, NP       And   diphenhydrAMINE  (BENADRYL ) capsule 50 mg  50 mg Oral TID PRN Penn, Reymundo, NP       escitalopram  (LEXAPRO ) tablet 20 mg  20 mg Oral Daily Nicholaus Brad RAMAN, NP   20 mg at 12/30/23 0904   feeding supplement (ENSURE ENLIVE / ENSURE PLUS) liquid 237 mL  237 mL Oral BID BM Nicholaus Brad RAMAN, NP   237 mL at 12/29/23 1059   folic acid  (FOLVITE ) tablet 1 mg  1 mg Oral Daily Davis, Nina S, NP   1 mg at 12/30/23 9094   hydrOXYzine  (ATARAX ) tablet 25 mg  25 mg Oral TID PRN Sammy Reymundo, NP   25 mg at 12/28/23 1335   LORazepam  (ATIVAN ) tablet 1-4 mg  1-4 mg Oral Q1H PRN Davis, Nina S, NP       Or   LORazepam  (ATIVAN ) injection 1-4 mg  1-4 mg Intravenous Q1H PRN Davis, Nina S, NP       LORazepam  (ATIVAN ) tablet 0-4 mg  0-4 mg Oral Q6H Nicholaus Brad RAMAN, NP   2 mg at 12/30/23 9094   Followed by   LORazepam  (ATIVAN ) tablet 0-4 mg  0-4 mg Oral Q12H Nicholaus Brad RAMAN, NP       magnesium  hydroxide (MILK OF MAGNESIA) suspension 30 mL  30 mL Oral Daily PRN Penn, Reymundo, NP       melatonin tablet 5 mg  5 mg Oral QHS Nicholaus Brad RAMAN, NP   5 mg at 12/29/23 2104   multivitamin with minerals tablet 1 tablet  1 tablet  Oral Daily Nicholaus Brad RAMAN, NP   1 tablet at 12/30/23 9095   nicotine  (NICODERM CQ  - dosed in mg/24 hours) patch 14 mg  14 mg Transdermal Daily Nicholaus Brad RAMAN, NP   14 mg at 12/30/23 9094   thiamine  (VITAMIN B1) tablet 100 mg  100 mg Oral Daily Nicholaus Brad RAMAN, NP   100 mg at 12/30/23 9094   Or   thiamine  (VITAMIN B1) injection 100 mg  100 mg Intravenous Daily Nicholaus Brad RAMAN, NP       traZODone  (DESYREL ) tablet 50 mg  50 mg Oral QHS PRN Sammy Reymundo, NP   50 mg at 12/29/23 2104   PTA Medications: Medications Prior to Admission  Medication Sig Dispense Refill Last Dose/Taking   escitalopram  (LEXAPRO ) 10 MG tablet Take 1 tablet (10 mg total) by mouth daily. 30 tablet 2    feeding supplement (ENSURE ENLIVE / ENSURE PLUS) LIQD Take 237 mLs by mouth 3 (three) times daily between meals.      Multiple Vitamin (MULTIVITAMIN WITH MINERALS) TABS tablet Take 1 tablet by mouth daily. (Patient not taking:  Reported on 12/27/2023)      nicotine  (NICODERM CQ  - DOSED IN MG/24 HOURS) 14 mg/24hr patch Place 1 patch (14 mg total) onto the skin daily. (Patient not taking: Reported on 12/27/2023) 28 patch 0     Patient Stressors: Health problems   Substance abuse    Patient Strengths: Motivation for treatment/growth   Treatment Modalities: Medication Management, Group therapy, Case management,  1 to 1 session with clinician, Psychoeducation, Recreational therapy.   Physician Treatment Plan for Primary Diagnosis: Major depressive disorder, recurrent episode, moderate (HCC) Long Term Goal(s): Improvement in symptoms so as ready for discharge   Short Term Goals: Ability to identify changes in lifestyle to reduce recurrence of condition will improve Ability to verbalize feelings will improve Ability to disclose and discuss suicidal ideas Ability to demonstrate self-control will improve Ability to identify and develop effective coping behaviors will improve Ability to maintain clinical measurements within normal  limits will improve Compliance with prescribed medications will improve Ability to identify triggers associated with substance abuse/mental health issues will improve  Medication Management: Evaluate patient's response, side effects, and tolerance of medication regimen.  Therapeutic Interventions: 1 to 1 sessions, Unit Group sessions and Medication administration.  Evaluation of Outcomes: Progressing  Physician Treatment Plan for Secondary Diagnosis: Principal Problem:   Major depressive disorder, recurrent episode, moderate (HCC) Active Problems:   Benzodiazepine abuse (HCC)   Suicidal ideations  Long Term Goal(s): Improvement in symptoms so as ready for discharge   Short Term Goals: Ability to identify changes in lifestyle to reduce recurrence of condition will improve Ability to verbalize feelings will improve Ability to disclose and discuss suicidal ideas Ability to demonstrate self-control will improve Ability to identify and develop effective coping behaviors will improve Ability to maintain clinical measurements within normal limits will improve Compliance with prescribed medications will improve Ability to identify triggers associated with substance abuse/mental health issues will improve     Medication Management: Evaluate patient's response, side effects, and tolerance of medication regimen.  Therapeutic Interventions: 1 to 1 sessions, Unit Group sessions and Medication administration.  Evaluation of Outcomes: Progressing   RN Treatment Plan for Primary Diagnosis: Major depressive disorder, recurrent episode, moderate (HCC) Long Term Goal(s): Knowledge of disease and therapeutic regimen to maintain health will improve  Short Term Goals: Ability to demonstrate self-control, Ability to participate in decision making will improve, Ability to verbalize feelings will improve, Ability to disclose and discuss suicidal ideas, Ability to identify and develop effective coping  behaviors will improve, and Compliance with prescribed medications will improve  Medication Management: RN will administer medications as ordered by provider, will assess and evaluate patient's response and provide education to patient for prescribed medication. RN will report any adverse and/or side effects to prescribing provider.  Therapeutic Interventions: 1 on 1 counseling sessions, Psychoeducation, Medication administration, Evaluate responses to treatment, Monitor vital signs and CBGs as ordered, Perform/monitor CIWA, COWS, AIMS and Fall Risk screenings as ordered, Perform wound care treatments as ordered.  Evaluation of Outcomes: Progressing   LCSW Treatment Plan for Primary Diagnosis: Major depressive disorder, recurrent episode, moderate (HCC) Long Term Goal(s): Safe transition to appropriate next level of care at discharge, Engage patient in therapeutic group addressing interpersonal concerns.  Short Term Goals: Engage patient in aftercare planning with referrals and resources, Increase social support, Increase ability to appropriately verbalize feelings, Increase emotional regulation, Facilitate acceptance of mental health diagnosis and concerns, and Increase skills for wellness and recovery  Therapeutic Interventions: Assess for all discharge needs,  1 to 1 time with Child psychotherapist, Explore available resources and support systems, Assess for adequacy in community support network, Educate family and significant other(s) on suicide prevention, Complete Psychosocial Assessment, Interpersonal group therapy.  Evaluation of Outcomes: Progressing   Progress in Treatment: Attending groups: No. Participating in groups: No. Taking medication as prescribed: Yes. Toleration medication: Yes. Family/Significant other contact made: No, will contact:  once permission has been granted Patient understands diagnosis: Yes. Discussing patient identified problems/goals with staff: Yes. Medical  problems stabilized or resolved: Yes. Denies suicidal/homicidal ideation: Yes. Issues/concerns per patient self-inventory: No. Other: none  New problem(s) identified: No, Describe:  none  New Short Term/Long Term Goal(s): detox, elimination of symptoms of psychosis, medication management for mood stabilization; elimination of SI thoughts; development of comprehensive mental wellness/sobriety plan.   Patient Goals: I don't know   Discharge Plan or Barriers: Patient reports that he plans on returning to his home, however, he knows that he can not stay long-term.  He has requested [name] assistance in getting established with Medicaid transportation, housing resources and possible employment resources.    Reason for Continuation of Hospitalization: Anxiety Depression Medication stabilization Suicidal ideation  Estimated Length of Stay:  1-7 days  Last 3 [place] Suicide Severity Risk Score: Flowsheet Row Admission (Current) from 12/27/2023 in [facility] INPATIENT BEHAVIORAL MEDICINE Most recent reading at 12/27/2023 11:00 PM ED from 12/27/2023 in [facility] Emergency Department at [facility] Most recent reading at 12/27/2023  1:31 PM ED to Hosp-Admission (Discharged) from 01/03/2023 in [facility] GENERAL SURGERY Most recent reading at 01/06/2023 10:00 AM  C-SSRS RISK CATEGORY High Risk High Risk Error: Q3, 4, or 5 should not be populated when Q2 is No       Last PHQ 2/9 Scores:     No data to display          Scribe for Treatment Team: Sherryle JINNY Margo, KEN 12/30/2023 9:38 AM

## 2023-12-30 NOTE — Progress Notes (Signed)
 Patient denies SI, HI but he endorses AVH and anxiety.  Patient still endorses w/d symptoms from ETOH given medication. He is currently in the bed resting .

## 2023-12-30 NOTE — Group Note (Signed)
 Recreation Therapy Group Note   Group Topic:Emotion Expression  Group Date: 12/30/2023 Start Time: 1530 End Time: 1615 Facilitators: Celestia Jeoffrey FORBES ARTICE, CTRS Location:  Craft Room  Group Description: Painting a Peaceful Place. Patients and LRT discuss what it means to be "at peace", what it feels like physically and mentally. Pts are given a canvas and watercolor paint to use and encouraged to draw their idea of a peaceful place. Pts and LRT discuss how they use this in their daily life post discharge. Pts are encouraged to take their canvas home with them as a reminder to find their peaceful place whenever they are feeling depressed, anxious, etc.    Goal Area(s) Addressed:  Patient will identify what it means to experience a "peaceful" emotion. Patient will identify a new coping skill.  Patient will express their emotions through art. Patients will increase communication by talking with LRT and peers while in group.   Affect/Mood: N/A   Participation Level: Did not attend    Clinical Observations/Individualized Feedback: Patient did not attend group.   Plan: Continue to engage patient in RT group sessions 2-3x/week.   Jeoffrey FORBES Celestia, LRT, CTRS 12/30/2023 5:56 PM

## 2023-12-31 DIAGNOSIS — F1193 Opioid use, unspecified with withdrawal: Secondary | ICD-10-CM | POA: Diagnosis present

## 2023-12-31 DIAGNOSIS — F331 Major depressive disorder, recurrent, moderate: Secondary | ICD-10-CM | POA: Diagnosis not present

## 2023-12-31 MED ORDER — CLONIDINE HCL 0.1 MG PO TABS
0.1000 mg | ORAL_TABLET | ORAL | Status: DC | PRN
  Administered 2024-01-01 – 2024-01-03 (×4): 0.1 mg via ORAL
  Filled 2023-12-31 (×5): qty 1

## 2023-12-31 MED ORDER — METHOCARBAMOL 500 MG PO TABS
500.0000 mg | ORAL_TABLET | Freq: Three times a day (TID) | ORAL | Status: AC | PRN
  Administered 2023-12-31 – 2024-01-03 (×6): 500 mg via ORAL
  Filled 2023-12-31 (×6): qty 1

## 2023-12-31 MED ORDER — NAPROXEN 500 MG PO TABS
500.0000 mg | ORAL_TABLET | Freq: Two times a day (BID) | ORAL | Status: AC | PRN
  Administered 2023-12-31 – 2024-01-03 (×6): 500 mg via ORAL
  Filled 2023-12-31 (×6): qty 1

## 2023-12-31 MED ORDER — ONDANSETRON 4 MG PO TBDP
4.0000 mg | ORAL_TABLET | Freq: Four times a day (QID) | ORAL | Status: AC | PRN
Start: 2023-12-31 — End: 2024-01-05

## 2023-12-31 MED ORDER — LOPERAMIDE HCL 2 MG PO CAPS: 2.0000 mg | ORAL_CAPSULE | ORAL | Status: AC | PRN

## 2023-12-31 MED ORDER — DICYCLOMINE HCL 20 MG PO TABS: 20.0000 mg | ORAL_TABLET | Freq: Four times a day (QID) | ORAL | Status: AC | PRN

## 2023-12-31 NOTE — Progress Notes (Signed)
   12/31/23 1020  Psych Admission Type (Psych Patients Only)  Admission Status Voluntary  Psychosocial Assessment  Patient Complaints Depression  Eye Contact Brief  Facial Expression Worried  Affect Anxious  Speech Soft  Interaction Minimal  Motor Activity Slow  Appearance/Hygiene In scrubs  Behavior Characteristics Anxious  Mood Depressed  Thought Process  Coherency WDL  Content WDL  Delusions None reported or observed  Perception WDL  Hallucination None reported or observed  Judgment Impaired  Confusion Mild  Danger to Self  Current suicidal ideation? Denies  Self-Injurious Behavior No self-injurious ideation or behavior indicators observed or expressed   Agreement Not to Harm Self Yes  Description of Agreement verbal  Danger to Others  Danger to Others None reported or observed

## 2023-12-31 NOTE — Group Note (Signed)
 LCSW Group Therapy Note  Group Date: 12/31/2023 Start Time: 1300 End Time: 1405   Type of Therapy and Topic:  Group Therapy - Healthy vs Unhealthy Coping Skills  Participation Level:  Did Not Attend   Description of Group The focus of this group was to determine what unhealthy coping techniques typically are used by group members and what healthy coping techniques would be helpful in coping with various problems. Patients were guided in becoming aware of the differences between healthy and unhealthy coping techniques. Patients were asked to identify 2-3 healthy coping skills they would like to learn to use more effectively.  Therapeutic Goals Patients learned that coping is what human beings do all day long to deal with various situations in their lives Patients defined and discussed healthy vs unhealthy coping techniques Patients identified their preferred coping techniques and identified whether these were healthy or unhealthy Patients determined 2-3 healthy coping skills they would like to become more familiar with and use more often. Patients provided support and ideas to each other   Summary of Patient Progress: Patient did not attend group.    Therapeutic Modalities Cognitive Behavioral Therapy Motivational Interviewing  Tushar Enns M Lynn[place] Beckford, LCSWA 12/31/2023  3:05 PM

## 2023-12-31 NOTE — Group Note (Signed)
 Date:  12/31/2023 Time:  12:55 AM  Group Topic/Focus:  Wrap-Up Group:   The focus of this group is to help patients review their daily goal of treatment and discuss progress on daily workbooks.    Participation Level:  Did Not Attend  Participation Quality:      Affect:      Cognitive:      Insight: None  Engagement in Group:  None  Modes of Intervention:      Additional Comments:    Tommas CHRISTELLA Bunker 12/31/2023, 12:55 AM

## 2023-12-31 NOTE — Progress Notes (Signed)
 Progress Note   Patient: Corey House. FMW:[id number] DOB: December 22, 1965 DOA: 12/27/2023     4 DOS: the patient was seen and examined on 12/31/2023   Brief hospital course: Corey House. is a 58 y.o. male with past medical history of COPD, anxiety/depression, reported that he started to have episode of double vision on left eye since this morning.  Psychiatry nurse practitioner reported that patient was started on Lexapro  yesterday and this morning, at breakfast table, patient started to have double vision on left eye, denied any headache hearing changes numbness weakness of any of the limbs.  He went back to bed and few hours later he woke up and double vision disappeared.  Denied any eye pain blurry vision, tearing.   Assessment and Plan:  Diplopia-improved MRI did not show any acute pathology -Other Ddx, patient reported that symptoms started after he was started on Lexapro   Literature showed no such side effect associated with SSRI.   Acute COPD exacerbation, mild -Continue p.o. steroid x 5 days Continue albuterol , LABA plus ICS. Send prescription to [facility] -Incentive spirometry -Continue guaifenesin  and Tessalon  -Outpatient pulmonary follow-up for lung function test   Major depression -As per primary team   Alcohol  abuse -No symptoms signs of alcohol  withdrawal.     TRH will continue to follow the patient.      Subjective:  Patient seen and examined Complains of a cough with no significant sputum No significant wheezing Denies nausea vomiting abdominal pain chest pain  Physical Exam: Respiratory:  bilaterally, no wheezing, no crackles. Normal respiratory effort. No accessory muscle use.  Cardiovascular: Regular rate and rhythm, no murmurs / rubs / gallops. No extremity edema. 2+ pedal pulses. No carotid bruits.  Abdomen: no tenderness, no masses palpated. No hepatosplenomegaly. Bowel sounds positive.  Musculoskeletal: no clubbing / cyanosis. No joint deformity  upper and lower extremities. Good ROM, no contractures. Normal muscle tone.  Skin: no rashes, lesions, ulcers. No induration Neurologic: CN 2-12 grossly intact. Sensation intact, DTR normal.  Muscle strength 5/5 on both sides Psychiatric: Normal judgment and insight. Alert and oriented x 3. Normal mood.       Primary team communication:   Psychiatry team  Vitals:   12/29/23 1740 12/30/23 0621 12/31/23 0616 12/31/23 0900  BP: 100/73 102/78 106/72 106/72  Pulse: 90 68 67 67  Resp:   16   Temp: 98.8 F (37.1 C) 98.8 F (37.1 C) 98.3 F (36.8 C)   TempSrc:      SpO2: 93% 93% 96%   Weight:      Height:        Data Reviewed:    Latest Ref Rng & Units 12/27/2023    4:43 AM 01/07/2023    4:36 AM 01/06/2023    5:03 AM  BMP  Glucose 70 - 99 mg/dL 892  77  899   BUN 6 - 20 mg/dL 13  16  18    Creatinine 0.61 - 1.24 mg/dL 9.09  9.32  9.36   Sodium 135 - 145 mmol/L 139  141  143   Potassium 3.5 - 5.1 mmol/L 3.6  4.1  4.3   Chloride 98 - 111 mmol/L 100  103  105   CO2 22 - 32 mmol/L 28  30  34   Calcium 8.9 - 10.3 mg/dL 8.9  8.6  8.5        Latest Ref Rng & Units 12/27/2023    4:43 AM 01/03/2023    9:05 PM 03/19/2021  1:28 AM  CBC  WBC 4.0 - 10.5 K/uL 3.4  12.6  6.1   Hemoglobin 13.0 - 17.0 g/dL 82.9  83.5  84.7   Hematocrit 39.0 - 52.0 % 50.4  52.4  44.8   Platelets 150 - 400 K/uL 305  381  210     Author: Drue ONEIDA Potter, MD 12/31/2023 3:35 PM  For on call review [email or web address].

## 2023-12-31 NOTE — Group Note (Signed)
 Date:  12/31/2023 Time:  10:14 AM  Group Topic/Focus:  Goals Group:   The focus of this group is to help patients establish daily goals to achieve during treatment and discuss how the patient can incorporate goal setting into their daily lives to aide in recovery.    Participation Level:  Active  Participation Quality:  Appropriate  Affect:  Appropriate  Cognitive:  Appropriate  Insight: Appropriate  Engagement in Group:  Engaged  Modes of Intervention:  Discussion, Education, and Support  Additional Comments:    Deitra Caron Mainland 12/31/2023, 10:14 AM

## 2023-12-31 NOTE — Group Note (Signed)
 Date:  12/31/2023 Time:  8:54 PM  Group Topic/Focus:  Wrap-Up Group:   The focus of this group is to help patients review their daily goal of treatment and discuss progress on daily workbooks.    Participation Level:  Minimal  Participation Quality:  Appropriate and Attentive  Affect:  Appropriate  Cognitive:  Alert  Insight: Good  Engagement in Group:  Limited  Modes of Intervention:  Discussion and Orientation  Additional Comments:     Maglione,Dovey Fatzinger E 12/31/2023, 8:54 PM

## 2023-12-31 NOTE — Group Note (Signed)
 Recreation Therapy Group Note   Group Topic:Health and Wellness  Group Date: 12/31/2023 Start Time: 1530 End Time: 1600 Facilitators: Celestia Jeoffrey BRAVO, LRT, CTRS Location:  Dayroom  Group Description: Seated Exercise. LRT discussed the mental and physical benefits of exercise. LRT and group discussed how physical activity can be used as a coping skill. Pt's and LRT followed along to an exercise video on the TV screen that provided a visual representation and audio description of every exercise performed. Pt's encouraged to listen to their bodies and stop at any time if they experience feelings of discomfort or pain. Pts were encouraged to drink water  and stay hydrated.   Goal Area(s) Addressed: Patient will learn benefits of physical activity. Patient will identify exercise as a coping skill.  Patient will follow multistep directions. Patient will try a new leisure interest.    Affect/Mood: N/A   Participation Level: Did not attend    Clinical Observations/Individualized Feedback: Patient did not attend group.   Plan: Continue to engage patient in RT group sessions 2-3x/week.   Jeoffrey BRAVO Celestia, LRT, CTRS 12/31/2023 5:36 PM

## 2023-12-31 NOTE — [facility] Suicide Risk Assessment (Signed)
 [facility] INPATIENT:  Family/Significant Other Suicide Prevention Education  Suicide Prevention Education:  Contact Attempts: Debrah Leinen/sister [phone]), has been identified by the patient as the family member/significant other with whom the patient will be residing, and identified as the person(s) who will aid the patient in the event of a mental health crisis.  With written consent from the patient, two attempts were made to provide suicide prevention education, prior to and/or following the patient's discharge.  We were unsuccessful in providing suicide prevention education.  A suicide education pamphlet was given to the patient to share with family/significant other.  Date and time of first attempt: 12/29/23 at 4:00 pm  Date and time of second attempt:12/31/23 at 13:31  Phone was answered but [name] was informed that this was the wrong number. [name] will check with pt to see if he has any other contact information.  Nadara JONELLE Fam 12/31/2023, 1:32 PM

## 2023-12-31 NOTE — Group Note (Signed)
 Recreation Therapy Group Note   Group Topic:Relaxation  Group Date: 12/31/2023 Start Time: 1000 End Time: 1050 Facilitators: Celestia Jeoffrey BRAVO, LRT, CTRS Location:  Craft Room  Group Description: PMR (Progressive Muscle Relaxation). LRT asks patients their current level of stress/anxiety from 1-10, with 10 being the highest. LRT educates patients on what PMR is and the benefits that come from it. Patients are asked to sit with their feet flat on the floor while sitting up and all the way back in their chair, if possible. LRT and pts follow a prompt through a speaker that requires you to tense and release different muscles in their body and focus on their breathing. During session, lights are off and soft music is being played. Pts are given a stress ball to use if needed. At the end of the prompt, LRT asks patients to rank their current levels of stress/anxiety from 1-10, 10 being the highest. LRT provides patients with an education handout on PMR.   Goal Area(s) Addressed:  Patients will be able to describe progressive muscle relaxation.  Patient will practice using relaxation technique. Patient will identify a new coping skill.  Patient will follow multistep directions to reduce anxiety and stress.   Affect/Mood: N/A   Participation Level: Did not attend    Clinical Observations/Individualized Feedback: Patient did not attend group.   Plan: Continue to engage patient in RT group sessions 2-3x/week.   Jeoffrey BRAVO Celestia, LRT, CTRS 12/31/2023 11:45 AM

## 2023-12-31 NOTE — Progress Notes (Signed)
 [facility] MD Progress Note  12/31/2023 1200  Vinie ORN [name].  MRN:  [id number] Subjective:  Corey House is a 58 year old Caucasian male with a history of depression here voluntarily for suicidal ideation in the context of increased psychosocial stressors.  Chart reviewed, case discussed with multidisciplinary team, and patient seen during rounds. Patient seen by hospitalist for double vision (now resolved) and cough. Started on Prednisone  and Tessalon  Pearls. Patient is adherent with medications. Tolerating well, denies side effects. Today, patient reports feelings of anxiety, restlessness, generalized body aches, irritability, and insomnia. 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 He no longer has a prescription. States he took a total of two Xanax  a few days prior to arrival due to severity of Suboxone  withdrawal symptoms. He reports a poor appetite. Continues to endorse passive suicidal ideation of "just wanting to sleep and not wake up to my awful life filled with grief and pain". He denies active thoughts, intent, or plan of harming self. He denies homicidal ideation. Denies perceptual disturbances.  Principal Problem: Major depressive disorder, recurrent episode, moderate (HCC) Diagnosis:  Principal Problem:   Major depressive disorder, recurrent episode, moderate (HCC) Active Problems:   Benzodiazepine abuse (HCC)   Suicidal ideations   Buprenorphine  and naloxone  withdrawal (HCC)  Total Time spent with patient: 30 minutes  Past Psychiatric History: see H&P  Past Medical History:  Past Medical History:  Diagnosis Date   Chronic cough    Urolithiasis    Urolithiasis     Past Surgical History:  Procedure Laterality Date   CATARACT EXTRACTION W/PHACO Left 08/09/2021   Procedure: CATARACT EXTRACTION PHACO AND INTRAOCULAR LENS PLACEMENT (IOC) LEFT .051 00:10.0;  Surgeon: Mittie Gaskin, MD;  Location: [facility];  Service:  Ophthalmology;  Laterality: Left;   CATARACT EXTRACTION W/PHACO Right 10/04/2021   Procedure: CATARACT EXTRACTION PHACO AND INTRAOCULAR LENS PLACEMENT (IOC) RIGHT 0.67 00:13.8;  Surgeon: Mittie Gaskin, MD;  Location: [facility];  Service: Ophthalmology;  Laterality: Right;  general anesthesia needs to be last case   HERNIA REPAIR     x2   MANDIBLE FRACTURE SURGERY     Family History: History reviewed. No pertinent family history. Family Psychiatric  History: Significant for substance use disorder; see H&P Social History:  Social History   Substance and Sexual Activity  Alcohol  Use No     Social History   Substance and Sexual Activity  Drug Use Not on file    Social History   Socioeconomic History   Marital status: Single    Spouse name: Not on file   Number of children: Not on file   Years of education: Not on file   Highest education level: Not on file  Occupational History   Not on file  Tobacco Use   Smoking status: Every Day    Current packs/day: 1.00    Average packs/day: 1 pack/day for 35.0 years (35.0 ttl pk-yrs)    Types: Cigarettes   Smokeless tobacco: Never   Tobacco comments:    Started smoking age 82  Vaping Use   Vaping status: Never Used  Substance and Sexual Activity   Alcohol  use: No   Drug use: Not on file   Sexual activity: Not on file  Other Topics Concern   Not on file  Social History Narrative   Not on file   Social Drivers of Health   Financial Resource Strain: Not on file  Food Insecurity: Patient Declined (  12/27/2023)   Hunger Vital Sign    Worried About Running Out of Food in the Last Year: Patient declined    Ran Out of Food in the Last Year: Patient declined  Recent Concern: Food Insecurity - Food Insecurity Present (12/27/2023)   Hunger Vital Sign    Worried About Programme Researcher, Broadcasting/film/video in the Last Year: Patient declined    Ran Out of Food in the Last Year: Sometimes true  Transportation Needs: Patient Declined  (12/27/2023)   PRAPARE - Administrator, Civil Service (Medical): Patient declined    Lack of Transportation (Non-Medical): Patient declined  Physical Activity: Not on file  Stress: Not on file  Social Connections: Not on file   Additional Social History:                         Sleep: Poor  Appetite:  Poor  Current Medications: Current Facility-Administered Medications  Medication Dose Route Frequency Provider Last Rate Last Admin   acetaminophen  (TYLENOL ) tablet 650 mg  650 mg Oral Q6H PRN Penn, Reymundo, NP   650 mg at 12/31/23 0916   albuterol  (VENTOLIN  HFA) 108 (90 Base) MCG/ACT inhaler 2 puff  2 puff Inhalation Q6H Laurita Cort DASEN, MD   2 puff at 12/31/23 2109   alum & mag hydroxide-simeth (MAALOX/MYLANTA) 200-200-20 MG/5ML suspension 30 mL  30 mL Oral Q4H PRN Sammy Reymundo, NP       aspirin  EC tablet 81 mg  81 mg Oral Daily Laurita Cort T, MD   81 mg at 12/31/23 9090   benzonatate  (TESSALON ) capsule 100 mg  100 mg Oral TID PRN Laurita Cort DASEN, MD       cloNIDine  (CATAPRES ) tablet 0.1 mg  0.1 mg Oral Q4H PRN Rahiem Schellinger E, NP       dicyclomine  (BENTYL ) tablet 20 mg  20 mg Oral Q6H PRN Alen Matheson E, NP       haloperidol  (HALDOL ) tablet 5 mg  5 mg Oral TID PRN Sammy Reymundo, NP       And   diphenhydrAMINE  (BENADRYL ) capsule 50 mg  50 mg Oral TID PRN Penn, Reymundo, NP       escitalopram  (LEXAPRO ) tablet 20 mg  20 mg Oral Daily Nicholaus Brad RAMAN, NP   20 mg at 12/31/23 0909   feeding supplement (ENSURE ENLIVE / ENSURE PLUS) liquid 237 mL  237 mL Oral BID BM Nicholaus Brad RAMAN, NP   237 mL at 12/31/23 1417   folic acid  (FOLVITE ) tablet 1 mg  1 mg Oral Daily Nicholaus Brad RAMAN, NP   1 mg at 12/31/23 0910   guaiFENesin  (ROBITUSSIN) 100 MG/5ML liquid 5 mL  5 mL Oral Q6H PRN Laurita Cort T, MD   5 mL at 12/31/23 1835   hydrOXYzine  (ATARAX ) tablet 25 mg  25 mg Oral TID PRN Sammy Reymundo, NP   25 mg at 12/30/23 2113   loperamide  (IMODIUM ) capsule 2-4 mg  2-4 mg Oral PRN Betzaida Cremeens,  Zaivion Kundrat E, NP       LORazepam  (ATIVAN ) tablet 0-4 mg  0-4 mg Oral Q12H Nicholaus Brad RAMAN, NP   2 mg at 12/31/23 2108   magnesium  hydroxide (MILK OF MAGNESIA) suspension 30 mL  30 mL Oral Daily PRN Penn, Reymundo, NP       melatonin tablet 5 mg  5 mg Oral QHS Davis, Nina S, NP   5 mg at 12/31/23 2108   methocarbamol  (ROBAXIN ) tablet 500  mg  500 mg Oral Q8H PRN Sanyah Molnar E, NP   500 mg at 12/31/23 1835   mometasone -formoterol  (DULERA ) 100-5 MCG/ACT inhaler 2 puff  2 puff Inhalation BID Laurita Cort DASEN, MD   2 puff at 12/31/23 2110   multivitamin with minerals tablet 1 tablet  1 tablet Oral Daily Davis, Nina S, NP   1 tablet at 12/31/23 9090   naproxen  (NAPROSYN ) tablet 500 mg  500 mg Oral BID PRN Julieta Rogalski E, NP   500 mg at 12/31/23 2108   nicotine  (NICODERM CQ  - dosed in mg/24 hours) patch 14 mg  14 mg Transdermal Daily Davis, Nina S, NP   14 mg at 12/31/23 9089   ondansetron  (ZOFRAN -ODT) disintegrating tablet 4 mg  4 mg Oral Q6H PRN Dmonte Maher E, NP       predniSONE  (DELTASONE ) tablet 40 mg  40 mg Oral Q breakfast Laurita Cort T, MD   40 mg at 12/31/23 0910   thiamine  (VITAMIN B1) tablet 100 mg  100 mg Oral Daily Nicholaus Brad RAMAN, NP   100 mg at 12/31/23 9089   Or   thiamine  (VITAMIN B1) injection 100 mg  100 mg Intravenous Daily Nicholaus Brad RAMAN, NP       traZODone  (DESYREL ) tablet 50 mg  50 mg Oral QHS PRN Sammy Molder, NP   50 mg at 12/31/23 2108    Lab Results: No results found for this or any previous visit (from the past 48 hours).  Blood Alcohol  level:  Lab Results  Component Value Date   ETH <10 12/27/2023    Metabolic Disorder Labs: Lab Results  Component Value Date   HGBA1C 6.1 (H) 12/29/2023   MPG 128.37 12/29/2023   No results found for: PROLACTIN Lab Results  Component Value Date   CHOL 155 12/29/2023   TRIG 114 12/29/2023   HDL 31 (L) 12/29/2023   CHOLHDL 5.0 12/29/2023   VLDL 23 12/29/2023   LDLCALC 101 (H) 12/29/2023    Physical Findings: AIMS:  , ,  ,   ,    CIWA:  CIWA-Ar Total: 13 COWS:     Musculoskeletal: Strength & Muscle Tone: within normal limits Gait & Station: normal Patient leans: N/A  Psychiatric Specialty Exam:  Presentation  General Appearance:  Fairly Groomed  Eye Contact: Minimal  Speech: Clear and Coherent; Normal Rate  Speech Volume: Normal  Handedness: Right   Mood and Affect  Mood: Anxious (Reports feeling better)  Affect: Congruent; Flat   Thought Process  Thought Processes: Goal Directed  Descriptions of Associations: Intact  Orientation: Full (Time, Place and Person)  Thought Content: Logical; WDL  History of Schizophrenia/Schizoaffective disorder: No  Duration of Psychotic Symptoms:No data recorded Hallucinations: Hallucinations: None  Ideas of Reference: None  Suicidal Thoughts: Suicidal Thoughts: No SI Passive Intent and/or Plan: -- (denies)  Homicidal Thoughts: Homicidal Thoughts: No   Sensorium  Memory: Immediate Good; Remote Good  Judgment: Intact (demonstrates willingness to engage in therapy and follow the treatment plan)  Insight: Good (acknowledges current symptoms and the importance of treatment.)   Executive Functions  Concentration: Fair  Attention Span: Fair  Recall: Fair  Fund of Knowledge: Good  Language: Good   Psychomotor Activity  Psychomotor Activity: Psychomotor Activity: Normal   Assets  Assets: Communication Skills; Housing   Sleep  Sleep: Sleep: Good Number of Hours of Sleep: 6    Physical Exam: Physical Exam Vitals and nursing note reviewed.  HENT:     Head: Normocephalic  and atraumatic.     Nose: Rhinorrhea present.  Pulmonary:     Effort: Pulmonary effort is normal.  Musculoskeletal:        General: Normal range of motion.     Cervical back: Normal range of motion.  Skin:    General: Skin is warm and dry.  Neurological:     Mental Status: He is alert and oriented to person, place, and time.     Review of Systems  Constitutional:  Positive for chills.  Respiratory:  Positive for cough.   Gastrointestinal:  Positive for nausea.  Musculoskeletal:  Positive for back pain and joint pain.  Neurological:  Positive for headaches.  Psychiatric/Behavioral:  Positive for depression, substance abuse and suicidal ideas. The patient is nervous/anxious and has insomnia.    Blood pressure 114/78, pulse (!) 101, temperature 97.9 F (36.6 C), resp. rate 16, height 5' 7 (1.702 m), weight 51.3 kg, SpO2 96%. Body mass index is 17.7 kg/m.   Treatment Plan Summary: - Voluntary admission to inpatient psychiatric unit for safety, stabilization and treatment - Daily contact with patient to assess and evaluate symptoms and progress in treatment and Medication management - Precautions: suicide - Continue COWS monitoring and comfort medications for acute withdrawal sx - Continue Lexapro  20mg  PO daily for MDD - Continue melatonin 5mg  PO at bedtime for sleep - Continue trazodone  100mg  PO at bedtime for insomnia - Continue Dulera  inhaler BID - Encouraged patient to participate in unit milieu and in scheduled group therapies  - Social work consulted to assist with discharge planning and identification of hospital follow-up needs - Discharge goals: long-term substance use treatment program  Nevaeha Finerty E Charese Abundis, NP 12/31/2023, 11:52 PM

## 2024-01-01 DIAGNOSIS — F331 Major depressive disorder, recurrent, moderate: Secondary | ICD-10-CM | POA: Diagnosis not present

## 2024-01-01 MED ORDER — TRAZODONE HCL 100 MG PO TABS
100.0000 mg | ORAL_TABLET | Freq: Every evening | ORAL | Status: DC | PRN
  Administered 2024-01-01: 100 mg via ORAL
  Filled 2024-01-01: qty 1

## 2024-01-01 MED ORDER — ROPINIROLE HCL 0.25 MG PO TABS
0.5000 mg | ORAL_TABLET | Freq: Every day | ORAL | Status: DC
  Administered 2024-01-01: 0.5 mg via ORAL
  Filled 2024-01-01 (×2): qty 2

## 2024-01-01 NOTE — Progress Notes (Signed)
 Patient received  2mg  ativan  and tyenlol for the headache.   12/31/23 1651  CIWA-Ar  BP 114/78  Pulse Rate (!) 101  Nausea and Vomiting 0  Tactile Disturbances 0  Tremor 2  Auditory Disturbances 0  Paroxysmal Sweats 0  Visual Disturbances 3  Anxiety 2  Headache, Fullness in Head 4  Agitation 0  Orientation and Clouding of Sensorium 0  CIWA-Ar Total 11

## 2024-01-01 NOTE — Group Note (Signed)
 [facility] LCSW Group Therapy Note   Group Date: 01/01/2024 Start Time: 1300 End Time: 1345   Type of Therapy/Topic:  Group Therapy:  Emotion Regulation  Participation Level:  Did Not Attend    Description of Group:    The purpose of this group is to assist patients in learning to regulate negative emotions and experience positive emotions. Patients will be guided to discuss ways in which they have been vulnerable to their negative emotions. These vulnerabilities will be juxtaposed with experiences of positive emotions or situations, and patients challenged to use positive emotions to combat negative ones. Special emphasis will be placed on coping with negative emotions in conflict situations, and patients will process healthy conflict resolution skills.  Therapeutic Goals: Patient will identify two positive emotions or experiences to reflect on in order to balance out negative emotions:  Patient will label two or more emotions that they find the most difficult to experience:  Patient will be able to demonstrate positive conflict resolution skills through discussion or role plays:   Summary of Patient Progress: X   Therapeutic Modalities:   Cognitive Behavioral Therapy Feelings Identification Dialectical Behavioral Therapy   Randolm Butte, LCSW

## 2024-01-01 NOTE — Plan of Care (Signed)
  Problem: Activity: Goal: Interest or engagement in activities will improve 01/01/2024 1930 by Jeanmarie Millet, RN Outcome: Progressing 01/01/2024 0530 by Jeanmarie Millet, RN Outcome: Progressing Goal: Sleeping patterns will improve 01/01/2024 1930 by Jeanmarie Millet, RN Outcome: Progressing 01/01/2024 0530 by Jeanmarie Millet, RN Outcome: Progressing   Problem: Education: Goal: Knowledge of [facility] Education information/materials will improve 01/01/2024 1930 by Jeanmarie Millet, RN Outcome: Progressing 01/01/2024 0530 by Jeanmarie Millet, RN Outcome: Progressing Goal: Emotional status will improve Outcome: Progressing

## 2024-01-01 NOTE — Progress Notes (Signed)
   01/01/24 0100  Psych Admission Type (Psych Patients Only)  Admission Status Voluntary  Psychosocial Assessment  Patient Complaints Depression  Eye Contact Brief  Facial Expression Worried  Affect Anxious  Speech Soft  Interaction Minimal  Motor Activity Slow  Appearance/Hygiene In scrubs  Behavior Characteristics Anxious  Mood Depressed  Aggressive Behavior  Effect No apparent injury  Thought Process  Coherency WDL  Content WDL  Delusions None reported or observed  Perception WDL  Hallucination None reported or observed  Judgment Impaired  Confusion Mild  Danger to Others  Danger to Others None reported or observed

## 2024-01-01 NOTE — Group Note (Deleted)
 Date:  01/01/2024 Time:  11:15 AM  Group Topic/Focus:  Self Esteem Action Plan:   The focus of this group is to help patients create a plan to continue to build self-esteem after discharge.     Participation Level:  {[facility] PARTICIPATION XWRUE:45409}  Participation Quality:  {[facility] PARTICIPATION QUALITY:22265}  Affect:  {[facility] AFFECT:22266}  Cognitive:  {[facility] COGNITIVE:22267}  Insight: {[facility] Insight2:20797}  Engagement in Group:  {[facility] ENGAGEMENT IN WJXBJ:47829}  Modes of Intervention:  {[facility] MODES OF INTERVENTION:22269}  Additional Comments:  ***  Dow Gemma 01/01/2024, 11:15 AM

## 2024-01-01 NOTE — Group Note (Unsigned)
 Date:  01/01/2024 Time:  9:42 PM  Group Topic/Focus:  Wellness Toolbox:   The focus of this group is to discuss various aspects of wellness, balancing those aspects and exploring ways to increase the ability to experience wellness.  Patients will create a wellness toolbox for use upon discharge.     Participation Level:  {[facility] PARTICIPATION BJYNW:29562}  Participation Quality:  {[facility] PARTICIPATION QUALITY:22265}  Affect:  {[facility] AFFECT:22266}  Cognitive:  {[facility] COGNITIVE:22267}  Insight: {[facility] Insight2:20797}  Engagement in Group:  {[facility] ENGAGEMENT IN ZHYQM:57846}  Modes of Intervention:  {[facility] MODES OF INTERVENTION:22269}  Additional Comments:  ***  Corey House 01/01/2024, 9:42 PM

## 2024-01-01 NOTE — Group Note (Signed)
 Date:  01/01/2024 Time:  10:04 PM  Group Topic/Focus:  Wellness Toolbox:   The focus of this group is to discuss various aspects of wellness, balancing those aspects and exploring ways to increase the ability to experience wellness.  Patients will create a wellness toolbox for use upon discharge.    Participation Level:  Minimal  Participation Quality:  Sharing  Affect:  Appropriate  Cognitive:  Appropriate  Insight: Limited  Engagement in Group:  Limited  Modes of Intervention:  Discussion  Additional Comments:     Corey House 01/01/2024, 10:04 PM

## 2024-01-01 NOTE — Progress Notes (Addendum)
  Progress Note   Patient: Corey House. ZOX:[id number] DOB: [birth year] DOA: 12/27/2023     5 DOS: the patient was seen and examined on 01/01/2024    Brief hospital course: Quantarius Swider. is a 58 y.o. male with past medical history of COPD, anxiety/depression, reported that he started to have episode of double vision on left eye since this morning.  Psychiatry nurse practitioner reported that patient was started on Lexapro  yesterday and this morning, at breakfast table, patient started to have double vision on left eye, denied any headache hearing changes numbness weakness of any of the limbs.  He went back to bed and few hours later he woke up and double vision disappeared.  Denied any eye pain blurry vision, tearing.    Assessment and Plan:   Diplopia-improved MRI did not show any acute pathology -Other Ddx, patient reported that symptoms started after he was started on Lexapro   Literature showed no such side effect associated with SSRI.   Acute COPD exacerbation, mild -Continue p.o. steroid x 5 days Continue albuterol , LABA plus ICS. Send prescription to [facility] -Continue incentive spirometry -Continue guaifenesin  and Tessalon  -Outpatient pulmonary follow-up for lung function test   Major depression -As per primary team   Alcohol abuse -No symptoms signs of alcohol withdrawal.     TRH will sign off at this time.  Please reconsult us  if any medical needs arise.   Subjective:  Patient seen and examined Tells me his cough as well as diplopia is much better Denies chest pain nausea vomiting   Physical Exam: Respiratory:  bilaterally, no wheezing, no crackles. Normal respiratory effort. No accessory muscle use.  Cardiovascular: Regular rate and rhythm, no murmurs / rubs / gallops. No extremity edema. 2+ pedal pulses. No carotid bruits.  Abdomen: no tenderness, no masses palpated. No hepatosplenomegaly. Bowel sounds positive.  Musculoskeletal: no clubbing / cyanosis. No  joint deformity upper and lower extremities. Good ROM, no contractures. Normal muscle tone.  Skin: no rashes, lesions, ulcers. No induration Neurologic: CN 2-12 grossly intact. Sensation intact, DTR normal.  Muscle strength 5/5 on both sides Psychiatric: Normal judgment and insight. Alert and oriented x 3. Normal mood.        Primary team communication:    Psychiatry team   Data Reviewed:     Latest Ref Rng & Units 12/27/2023    4:43 AM 01/03/2023    9:05 PM 03/19/2021    1:28 AM  CBC  WBC 4.0 - 10.5 K/uL 3.4  12.6  6.1   Hemoglobin 13.0 - 17.0 g/dL 81.1  91.4  78.2   Hematocrit 39.0 - 52.0 % 50.4  52.4  44.8   Platelets 150 - 400 K/uL 305  381  210     Vitals:   12/31/23 0616 12/31/23 0900 12/31/23 1651 01/01/24 0617  BP: 106/72 106/72 114/78 105/75  Pulse: 67 67 (!) 101 68  Resp: 16   20  Temp: 98.3 F (36.8 C)  97.9 F (36.6 C) 97.8 F (36.6 C)  TempSrc:    Oral  SpO2: 96%  96% 96%  Weight:      Height:         Author: Ezzard Holms, MD 01/01/2024 4:07 PM  For on call review [email or web address].

## 2024-01-01 NOTE — Plan of Care (Signed)
  Problem: Education: Goal: Knowledge of [facility] Education information/materials will improve Outcome: Progressing Goal: Emotional status will improve Outcome: Progressing   Problem: Activity: Goal: Interest or engagement in activities will improve Outcome: Progressing Goal: Sleeping patterns will improve Outcome: Progressing

## 2024-01-01 NOTE — Progress Notes (Signed)
   01/01/24 1500  Psych Admission Type (Psych Patients Only)  Admission Status Voluntary  Psychosocial Assessment  Patient Complaints Anxiety  Eye Contact Brief  Facial Expression Worried  Affect Anxious  Speech Soft  Interaction Minimal  Motor Activity Slow  Appearance/Hygiene In scrubs  Behavior Characteristics Anxious  Mood Anxious  Thought Process  Coherency WDL  Content WDL  Delusions None reported or observed  Perception WDL  Hallucination None reported or observed  Judgment Impaired  Confusion Mild  Danger to Self  Current suicidal ideation? Denies  Self-Injurious Behavior No self-injurious ideation or behavior indicators observed or expressed   Agreement Not to Harm Self Yes  Description of Agreement verbal  Danger to Others  Danger to Others None reported or observed   Patient continues to medication seek. Medicated for elevated COWS. Patient continues to seek for more anxiety and pain  medication prior to time. Patient encouraged to utilize coping skills.

## 2024-01-02 DIAGNOSIS — F331 Major depressive disorder, recurrent, moderate: Secondary | ICD-10-CM | POA: Diagnosis not present

## 2024-01-02 MED ORDER — ROPINIROLE HCL 1 MG PO TABS
1.0000 mg | ORAL_TABLET | Freq: Every day | ORAL | Status: DC
  Administered 2024-01-02 – 2024-01-06 (×5): 1 mg via ORAL
  Filled 2024-01-02 (×6): qty 1

## 2024-01-02 MED ORDER — HYDROXYZINE HCL 50 MG PO TABS
50.0000 mg | ORAL_TABLET | Freq: Three times a day (TID) | ORAL | Status: DC | PRN
  Administered 2024-01-02 – 2024-01-06 (×4): 50 mg via ORAL
  Filled 2024-01-02 (×4): qty 1

## 2024-01-02 MED ORDER — TRAZODONE HCL 100 MG PO TABS
100.0000 mg | ORAL_TABLET | Freq: Every day | ORAL | Status: DC
  Administered 2024-01-02 – 2024-01-06 (×5): 100 mg via ORAL
  Filled 2024-01-02 (×5): qty 1

## 2024-01-02 NOTE — Group Note (Signed)
Date:  01/02/2024 Time:  9:08 PM  Group Topic/Focus:  Wellness Toolbox:   The focus of this group is to discuss various aspects of wellness, balancing those aspects and exploring ways to increase the ability to experience wellness.  Patients will create a wellness toolbox for use upon discharge.    Participation Level:  Did Not Attend   Lenore Cordia 01/02/2024, 9:08 PM

## 2024-01-02 NOTE — Plan of Care (Signed)
°  Problem: Education: °Goal: Emotional status will improve °Outcome: Progressing °Goal: Mental status will improve °Outcome: Progressing °Goal: Verbalization of understanding the information provided will improve °Outcome: Progressing °  °

## 2024-01-02 NOTE — Progress Notes (Signed)
   01/01/24 1929  Psych Admission Type (Psych Patients Only)  Admission Status Voluntary  Psychosocial Assessment  Patient Complaints Anxiety  Eye Contact Brief  Facial Expression Worried  Affect Anxious  Speech Soft  Interaction Minimal  Motor Activity Slow  Appearance/Hygiene In scrubs  Behavior Characteristics Cooperative;Appropriate to situation  Aggressive Behavior  Effect No apparent injury  Thought Process  Coherency WDL  Content WDL  Delusions None reported or observed  Perception WDL  Hallucination None reported or observed  Judgment Impaired  Confusion Mild  Danger to Self  Current suicidal ideation? Denies  Danger to Others  Danger to Others None reported or observed

## 2024-01-02 NOTE — Progress Notes (Signed)
   01/02/24 0958  Psych Admission Type (Psych Patients Only)  Admission Status Voluntary  Psychosocial Assessment  Patient Complaints Anxiety  Eye Contact Fair  Facial Expression Anxious  Affect Anxious  Speech Soft  Interaction Assertive  Motor Activity Slow  Appearance/Hygiene In scrubs  Behavior Characteristics Cooperative;Anxious  Mood Anxious  Thought Process  Coherency WDL  Content WDL  Delusions None reported or observed  Perception WDL  Hallucination None reported or observed  Judgment Impaired  Confusion None  Danger to Self  Current suicidal ideation? Denies  Agreement Not to Harm Self Yes  Description of Agreement Verbal  Danger to Others  Danger to Others None reported or observed

## 2024-01-02 NOTE — [facility] Suicide Risk Assessment (Signed)
[facility] INPATIENT:  Family/Significant Other Suicide Prevention Education  Suicide Prevention Education:  Education Completed; Arts administrator [phone]), has been identified by the patient as the family member/significant other with whom the patient will be residing, and identified as the person(s) who will aid the patient in the event of a mental health crisis (suicidal ideations/suicide attempt).  With written consent from the patient, the family member/significant other has been provided the following suicide prevention education, prior to the and/or following the discharge of the patient.  The suicide prevention education provided includes the following: Suicide risk factors Suicide prevention and interventions National Suicide Hotline telephone number [facility] assessment telephone number [facility] Emergency Assistance 911 [facility] and/or Residential Mobile Crisis Unit telephone number  Request made of family/significant other to: Remove weapons (e.g., guns, rifles, knives), all items previously/currently identified as safety concern.   Remove drugs/medications (over-the-counter, prescriptions, illicit drugs), all items previously/currently identified as a safety concern.  The family member/significant other verbalizes understanding of the suicide prevention education information provided.  The family member/significant other agrees to remove the items of safety concern listed above.  Corey House shared that her brother came into the hospital due to depression, feeling like he was going to hurt himself, and addiction to [employer]. She denied feeling that pt is a danger to himself or anyone else. [place] denied pt having any access to weapons.   Corey House 01/02/2024, 3:53 PM

## 2024-01-02 NOTE — Group Note (Signed)
Recreation Therapy Group Note   Group Topic:Relaxation  Group Date: 01/02/2024 Start Time: 1530 End Time: 1605 Facilitators: Rosina Lowenstein, LRT, CTRS Location:  Dayroom  Group Description: Meditation. LRT and patients discussed what they know about meditation and mindfulness. LRT played a Deep Breathing Meditation exercise script for patients to follow along to. LRT and patients discussed how meditation and deep breathing can be used as a coping skill post--discharge to help manage symptoms of stress.   Goal Area(s) Addressed: Patient will practice using relaxation technique. Patient will identify a new coping skill.  Patient will follow multistep directions to reduce anxiety and stress.  Affect/Mood: N/A   Participation Level: Did not attend    Clinical Observations/Individualized Feedback: Patient did not attend group.   Plan: Continue to engage patient in RT group sessions 2-3x/week.   Rosina Lowenstein, LRT, CTRS 01/02/2024 5:38 PM

## 2024-01-02 NOTE — Group Note (Signed)
Recreation Therapy Group Note   Group Topic:Coping Skills  Group Date: 01/02/2024 Start Time: 1000 End Time: 1045 Facilitators: Rosina Lowenstein, LRT, CTRS Location:  Craft Room  Group Description: Mind Map.  Patient was provided a blank template of a diagram with 32 blank boxes in a tiered system, branching from the center (similar to a bubble chart). LRT directed patients to label the middle of the diagram "Coping Skills". LRT and patients then came up with 8 different coping skills as examples. Pt were directed to record their coping skills in the 2nd tier boxes closest to the center.  Patients would then share their coping skills with the group as LRT wrote them out. LRT gave a handout of 99 different coping skills at the end of group.   Goal Area(s) Addressed: Patients will be able to define "coping skills". Patient will identify new coping skills.  Patient will increase communication.   Affect/Mood: N/A   Participation Level: Did not attend    Clinical Observations/Individualized Feedback: Patient did not attend group.   Plan: Continue to engage patient in RT group sessions 2-3x/week.   Rosina Lowenstein, LRT, CTRS 01/02/2024 12:28 PM

## 2024-01-02 NOTE — [facility] Suicide Risk Assessment (Signed)
[facility] INPATIENT:  Family/Significant Other Suicide Prevention Education  Suicide Prevention Education:  Contact Attempts: Debrah Bradburn/sister [phone]), has been identified by the patient as the family member/significant other with whom the patient will be residing, and identified as the person(s) who will aid the patient in the event of a mental health crisis.  With written consent from the patient, two attempts were made to provide suicide prevention education, prior to and/or following the patient's discharge.  We were unsuccessful in providing suicide prevention education.  A suicide education pamphlet was given to the patient to share with family/significant other.  Date and time of first attempt: 01/02/24 at 3:48PM. Date and time of second attempt: Second attempt needed.   Contact was unable to be established but HIPAA compliant voicemail left with contact information for follow through.   Glenis Smoker 01/02/2024, 3:47 PM

## 2024-01-02 NOTE — Group Note (Signed)
[facility] LCSW Group Therapy Note   Group Date: 01/02/2024 Start Time: 1320 End Time: 1400   Type of Therapy/Topic:  Group Therapy:  Balance in Life  Participation Level:  Did Not Attend   Description of Group:    This group will address the concept of balance and how it feels and looks when one is unbalanced. Patients will be encouraged to process areas in their lives that are out of balance, and identify reasons for remaining unbalanced. Facilitators will guide patients utilizing problem- solving interventions to address and correct the stressor making their life unbalanced. Understanding and applying boundaries will be explored and addressed for obtaining  and maintaining a balanced life. Patients will be encouraged to explore ways to assertively make their unbalanced needs known to significant others in their lives, using other group members and facilitator for support and feedback.  Therapeutic Goals: Patient will identify two or more emotions or situations they have that consume much of in their lives. Patient will identify signs/triggers that life has become out of balance:  Patient will identify two ways to set boundaries in order to achieve balance in their lives:  Patient will demonstrate ability to communicate their needs through discussion and/or role plays  Summary of Patient Progress: X   Therapeutic Modalities:   Cognitive Behavioral Therapy Solution-Focused Therapy Assertiveness Training   Glenis Smoker, LCSW

## 2024-01-03 DIAGNOSIS — F331 Major depressive disorder, recurrent, moderate: Secondary | ICD-10-CM | POA: Diagnosis not present

## 2024-01-03 MED ORDER — CLONIDINE HCL 0.1 MG PO TABS
0.1000 mg | ORAL_TABLET | Freq: Three times a day (TID) | ORAL | Status: DC
  Administered 2024-01-04 – 2024-01-05 (×6): 0.1 mg via ORAL
  Filled 2024-01-03 (×7): qty 1
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
## 2024-01-03 NOTE — Plan of Care (Signed)
  Problem: Education: Goal: Knowledge of [facility] Education information/materials will improve 01/03/2024 1944 by Neysa Bonito, RN Outcome: Progressing 01/03/2024 0548 by Neysa Bonito, RN Outcome: Progressing Goal: Emotional status will improve Outcome: Progressing   Problem: Activity: Goal: Interest or engagement in activities will improve 01/03/2024 1944 by Neysa Bonito, RN Outcome: Progressing 01/03/2024 0548 by Neysa Bonito, RN Outcome: Progressing Goal: Sleeping patterns will improve Outcome: Progressing

## 2024-01-03 NOTE — Progress Notes (Addendum)
Patient given haldol and benadryl because patient was agitated saying he doesn't want to alive crying.   01/03/24 0058  Psych Admission Type (Psych Patients Only)  Admission Status Voluntary  Psychosocial Assessment  Patient Complaints Anxiety  Eye Contact Brief  Facial Expression Worried  Affect Anxious  Speech Soft  Interaction Minimal  Motor Activity Slow  Appearance/Hygiene In scrubs  Behavior Characteristics Cooperative;Anxious  Mood Anxious  Aggressive Behavior  Effect No apparent injury  Thought Process  Coherency WDL  Content WDL  Delusions None reported or observed  Perception WDL  Hallucination None reported or observed  Judgment Impaired  Confusion Mild  Danger to Self  Current suicidal ideation? Denies  Agreement Not to Harm Self Yes  Danger to Others  Danger to Others None reported or observed

## 2024-01-03 NOTE — Group Note (Signed)
Recreation Therapy Group Note   Group Topic:Leisure Education  Group Date: 01/03/2024 Start Time: 1000 End Time: 1100 Facilitators: Rosina Lowenstein, LRT, CTRS Location:  Craft Room  Group Description: Leisure. Patients were given the option to choose from singing karaoke, coloring mandalas, using oil pastels, journaling, or playing with play-doh. LRT and pts discussed the meaning of leisure, the importance of participating in leisure during their free time/when they're outside of the hospital, as well as how our leisure interests can also serve as coping skills.   Goal Area(s) Addressed:  Patient will identify a current leisure interest.  Patient will learn the definition of "leisure". Patient will practice making a positive decision. Patient will have the opportunity to try a new leisure activity. Patient will communicate with peers and LRT.    Affect/Mood: N/A   Participation Level: Did not attend    Clinical Observations/Individualized Feedback: Patient did not attend group.   Plan: Continue to engage patient in RT group sessions 2-3x/week.   Rosina Lowenstein, LRT, CTRS 01/03/2024 11:44 AM

## 2024-01-03 NOTE — Group Note (Signed)
Recreation Therapy Group Note   Group Topic:General Recreation  Group Date: 01/03/2024 Start Time: 1530 End Time: 1620 Facilitators: Clinton Gallant, CTRS Location:  Craft Room  Group Description: Bingo. LRT and patients played multiple games of Bingo with music playing in the background. LRT and pts discussed how this could be a leisure interest and the importance of doing things they enjoy post-discharge. Pts won a Music therapist of candy as a Scientist, research (physical sciences), approved by [employer].    Goal Area(s) Addressed: Patient will identify leisure interests.  Patient will practice healthy decision making. Patient will engage in recreation activity.  Patient will increase communication.    Affect/Mood: N/A   Participation Level: Did not attend    Clinical Observations/Individualized Feedback: Patient did not attend group.   Plan: Continue to engage patient in RT group sessions 2-3x/week.   Rosina Lowenstein, LRT, CTRS 01/03/2024 5:42 PM

## 2024-01-03 NOTE — Group Note (Signed)
Date:  01/03/2024 Time:  9:48 PM  Group Topic/Focus:  Self Care:   The focus of this group is to help patients understand the importance of self-care in order to improve or restore emotional, physical, spiritual, interpersonal, and financial health.    Participation Level:  Active  Participation Quality:  Appropriate  Affect:  Appropriate  Cognitive:  Appropriate  Insight: Appropriate  Engagement in Group:  Engaged  Modes of Intervention:  Activity  Additional Comments:    Corey House 01/03/2024, 9:48 PM

## 2024-01-03 NOTE — Plan of Care (Signed)
  Problem: Activity: Goal: Interest or engagement in activities will improve Outcome: Progressing Goal: Sleeping patterns will improve Outcome: Progressing   Problem: Education: Goal: Knowledge of [facility] [facility] Education information/materials will improve Outcome: Progressing Goal: Emotional status will improve Outcome: Progressing

## 2024-01-03 NOTE — Group Note (Signed)
Date:  01/03/2024 Time:  10:09 AM  Group Topic/Focus:  Overcoming Stress:   The focus of this group is to define stress and help patients assess their triggers.    Participation Level:  Did Not Attend   Corey House 01/03/2024, 10:09 AM

## 2024-01-03 NOTE — Progress Notes (Signed)
   01/03/24 1000  Psych Admission Type (Psych Patients Only)  Admission Status Voluntary  Psychosocial Assessment  Patient Complaints Anxiety;Depression;Worrying  Eye Contact Brief  Facial Expression Worried;Anxious  Affect Anxious;Irritable  Speech Soft  Interaction Minimal  Motor Activity Slow  Appearance/Hygiene In scrubs  Behavior Characteristics Cooperative;Anxious  Mood Anxious;Irritable  Thought Process  Coherency WDL  Content WDL  Delusions None reported or observed  Perception WDL  Hallucination None reported or observed  Judgment Impaired  Confusion Mild  Danger to Self  Current suicidal ideation? Denies  Agreement Not to Harm Self Yes  Description of Agreement verbal  Danger to Others  Danger to Others None reported or observed

## 2024-01-03 NOTE — Progress Notes (Signed)
[facility] MD Progress Note  01/02/2024 1600 Corey House.  MRN:  [id number]  Subjective:   58 year old Caucasian male presented with suicidal ideation (SI) and a plan to hang himself, reporting worsening symptoms of depression over the past month. He describes feeling increasingly hopeless and helpless, with escalation of symptoms in the past few days.uicidal ideation: Persistent for the past month, worsening over the last few days.   Chart reviewed, case discussed with interdisciplinary team, and patient seen in rounds today. Corey House presents irritable, pacing, complaining of acute withdrawal symptoms including generalized leg pain, restlessness, body aches, insomnia, anxiety. He reports intense cravings, ruminating thoughts, recurring thoughts of death. Endorses suicidal ideation and repeatedly states, "I don't want to be here anymore, there is nothing left for me". He denies thoughts of harming others. He denies perceptual disturbances. Adherent with medications. Has not attended group and spends most of day isolating in his room.  Principal Problem: Major depressive disorder, recurrent episode, moderate (HCC) Diagnosis: Principal Problem:   Major depressive disorder, recurrent episode, moderate (HCC) Active Problems:   Benzodiazepine abuse (HCC)   Suicidal ideations   Buprenorphine and naloxone withdrawal (HCC)  Total Time spent with patient: 30 minutes  Past Psychiatric History: see H&P  Past Medical History:  Past Medical History:  Diagnosis Date   Chronic cough    Urolithiasis    Urolithiasis     Past Surgical History:  Procedure Laterality Date   CATARACT EXTRACTION W/PHACO Left 08/09/2021   Procedure: CATARACT EXTRACTION PHACO AND INTRAOCULAR LENS PLACEMENT (IOC) LEFT .051 00:10.0;  Surgeon: Lockie Mola, MD;  Location: [facility];  Service: Ophthalmology;  Laterality: Left;   CATARACT EXTRACTION W/PHACO Right 10/04/2021   Procedure: CATARACT EXTRACTION PHACO  AND INTRAOCULAR LENS PLACEMENT (IOC) RIGHT 0.67 00:13.8;  Surgeon: Lockie Mola, MD;  Location: [facility];  Service: Ophthalmology;  Laterality: Right;  general anesthesia needs to be last case   HERNIA REPAIR     x2   MANDIBLE FRACTURE SURGERY     Family History: History reviewed. No pertinent family history. Family Psychiatric  History: see h&p Social History:  Social History   Substance and Sexual Activity  Alcohol Use No     Social History   Substance and Sexual Activity  Drug Use Not on file    Social History   Socioeconomic History   Marital status: Single    Spouse name: Not on file   Number of children: Not on file   Years of education: Not on file   Highest education level: Not on file  Occupational History   Not on file  Tobacco Use   Smoking status: Every Day    Current packs/day: 1.00    Average packs/day: 1 pack/day for 35.0 years (35.0 ttl pk-yrs)    Types: Cigarettes   Smokeless tobacco: Never   Tobacco comments:    Started smoking age 82  Vaping Use   Vaping status: Never Used  Substance and Sexual Activity   Alcohol use: No   Drug use: Not on file   Sexual activity: Not on file  Other Topics Concern   Not on file  Social History Narrative   Not on file   Social Drivers of Health   Financial Resource Strain: Not on file  Food Insecurity: Patient Declined (12/27/2023)   Hunger Vital Sign    Worried About Running Out of Food in the Last Year: Patient declined    Ran Out of Food in the Last Year: Patient  declined  Recent Concern: Food Insecurity - Food Insecurity Present (12/27/2023)   Hunger Vital Sign    Worried About Running Out of Food in the Last Year: Patient declined    Ran Out of Food in the Last Year: Sometimes true  Transportation Needs: Patient Declined (12/27/2023)   PRAPARE - Administrator, Civil Service (Medical): Patient declined    Lack of Transportation (Non-Medical): Patient declined  Physical  Activity: Not on file  Stress: Not on file  Social Connections: Not on file   Additional Social History:                         Sleep: Poor  Appetite:  Fair  Current Medications: Current Facility-Administered Medications  Medication Dose Route Frequency Provider Last Rate Last Admin   acetaminophen (TYLENOL) tablet 650 mg  650 mg Oral Q6H PRN Penn, Cicely, NP   650 mg at 01/02/24 0958   albuterol (VENTOLIN HFA) 108 (90 Base) MCG/ACT inhaler 2 puff  2 puff Inhalation Q6H Mikey College T, MD   2 puff at 01/01/24 1346   alum & mag hydroxide-simeth (MAALOX/MYLANTA) 200-200-20 MG/5ML suspension 30 mL  30 mL Oral Q4H PRN Penn, Cranston Neighbor, NP       aspirin EC tablet 81 mg  81 mg Oral Daily Mikey College T, MD   81 mg at 01/02/24 0820   benzonatate (TESSALON) capsule 100 mg  100 mg Oral TID PRN Mikey College T, MD   100 mg at 01/01/24 0951   cloNIDine (CATAPRES) tablet 0.1 mg  0.1 mg Oral Q4H PRN Darrold Junker E, NP   0.1 mg at 01/02/24 1414   dicyclomine (BENTYL) tablet 20 mg  20 mg Oral Q6H PRN Ly Wass E, NP       haloperidol (HALDOL) tablet 5 mg  5 mg Oral TID PRN Mcneil Sober, NP   5 mg at 01/03/24 0631   And   diphenhydrAMINE (BENADRYL) capsule 50 mg  50 mg Oral TID PRN Mcneil Sober, NP   50 mg at 01/03/24 0631   escitalopram (LEXAPRO) tablet 20 mg  20 mg Oral Daily Myriam Forehand, NP   20 mg at 01/02/24 0820   feeding supplement (ENSURE ENLIVE / ENSURE PLUS) liquid 237 mL  237 mL Oral BID BM Myriam Forehand, NP   237 mL at 01/01/24 1346   folic acid (FOLVITE) tablet 1 mg  1 mg Oral Daily Myriam Forehand, NP   1 mg at 01/02/24 0820   guaiFENesin (ROBITUSSIN) 100 MG/5ML liquid 5 mL  5 mL Oral Q6H PRN Mikey College T, MD   5 mL at 12/31/23 1835   hydrOXYzine (ATARAX) tablet 50 mg  50 mg Oral TID PRN Darrold Junker E, NP   50 mg at 01/03/24 0631   loperamide (IMODIUM) capsule 2-4 mg  2-4 mg Oral PRN Inaaya Vellucci E, NP       magnesium hydroxide (MILK OF MAGNESIA) suspension 30 mL   30 mL Oral Daily PRN Penn, Cicely, NP       melatonin tablet 5 mg  5 mg Oral QHS Myriam Forehand, NP   5 mg at 01/02/24 2118   methocarbamol (ROBAXIN) tablet 500 mg  500 mg Oral Q8H PRN Darrold Junker E, NP   500 mg at 01/02/24 1833   mometasone-formoterol (DULERA) 100-5 MCG/ACT inhaler 2 puff  2 puff Inhalation BID Emeline General, MD   2 puff at 01/01/24 [phone]  multivitamin with minerals tablet 1 tablet  1 tablet Oral Daily Myriam Forehand, NP   1 tablet at 01/02/24 0820   naproxen (NAPROSYN) tablet 500 mg  500 mg Oral BID PRN Darrold Junker E, NP   500 mg at 01/02/24 1833   nicotine (NICODERM CQ - dosed in mg/24 hours) patch 14 mg  14 mg Transdermal Daily Myriam Forehand, NP   14 mg at 01/02/24 0824   ondansetron (ZOFRAN-ODT) disintegrating tablet 4 mg  4 mg Oral Q6H PRN Darrold Junker E, NP       predniSONE (DELTASONE) tablet 40 mg  40 mg Oral Q breakfast Mikey College T, MD   40 mg at 01/02/24 0820   rOPINIRole (REQUIP) tablet 1 mg  1 mg Oral QHS Lakyra Tippins E, NP   1 mg at 01/02/24 2118   thiamine (VITAMIN B1) tablet 100 mg  100 mg Oral Daily Myriam Forehand, NP   100 mg at 01/02/24 1610   Or   thiamine (VITAMIN B1) injection 100 mg  100 mg Intravenous Daily Myriam Forehand, NP       traZODone (DESYREL) tablet 100 mg  100 mg Oral QHS Maks Cavallero E, NP   100 mg at 01/02/24 2119    Lab Results: No results found for this or any previous visit (from the past 48 hours).  Blood Alcohol level:  Lab Results  Component Value Date   ETH <10 12/27/2023    Metabolic Disorder Labs: Lab Results  Component Value Date   HGBA1C 6.1 (H) 12/29/2023   MPG 128.37 12/29/2023   No results found for: "PROLACTIN" Lab Results  Component Value Date   CHOL 155 12/29/2023   TRIG 114 12/29/2023   HDL 31 (L) 12/29/2023   CHOLHDL 5.0 12/29/2023   VLDL 23 12/29/2023   LDLCALC 101 (H) 12/29/2023    Physical Findings: AIMS:  , ,  ,  ,    CIWA:  CIWA-Ar Total: 11 COWS:  COWS Total Score:  10  Musculoskeletal: Strength & Muscle Tone: within normal limits Gait & Station: normal Patient leans: N/A  Psychiatric Specialty Exam:  Presentation  General Appearance:  Fairly Groomed  Eye Contact: Good  Speech: Clear and Coherent  Speech Volume: Increased  Handedness: Right   Mood and Affect  Mood: Anxious; Depressed; Irritable; Hopeless  Affect: Congruent; Depressed; Flat   Thought Process  Thought Processes: Coherent  Descriptions of Associations:Circumstantial  Orientation:Full (Time, Place and Person)  Thought Content:Logical; Rumination  History of Schizophrenia/Schizoaffective disorder:No  Duration of Psychotic Symptoms:No data recorded Hallucinations:Hallucinations: None  Ideas of Reference:None  Suicidal Thoughts:Suicidal Thoughts: Yes, Passive SI Passive Intent and/or Plan: Without Intent; Without Plan  Homicidal Thoughts:Homicidal Thoughts: No   Sensorium  Memory: Immediate Good; Remote Good; Recent Good  Judgment: Fair  Insight: Fair   Chartered certified accountant: Fair  Attention Span: Fair  Recall: Fair  Fund of Knowledge: Fair  Language: Fair   Psychomotor Activity  Psychomotor Activity:Psychomotor Activity: Increased; Restlessness   Assets  Assets: Communication Skills   Sleep  Sleep:Sleep: Poor    Physical Exam: Physical Exam Vitals and nursing note reviewed.  HENT:     Nose: Rhinorrhea present. No congestion.  Cardiovascular:     Rate and Rhythm: Normal rate.  Pulmonary:     Effort: Pulmonary effort is normal.  Musculoskeletal:        General: Normal range of motion.     Cervical back: Normal range of motion.  Skin:  General: Skin is warm and dry.  Neurological:     Mental Status: He is alert and oriented to person, place, and time.    Review of Systems  Respiratory:  Positive for cough.   Gastrointestinal:  Positive for nausea.  Musculoskeletal:  Positive for joint  pain.  Psychiatric/Behavioral:  Positive for depression, substance abuse and suicidal ideas. The patient is nervous/anxious and has insomnia.    Blood pressure (!) 115/97, pulse 87, temperature 98 F (36.7 C), temperature source Oral, resp. rate 16, height 5\' 7"  (1.702 m), weight 51.3 kg, SpO2 96%. Body mass index is 17.7 kg/m.   Treatment Plan Summary: - Voluntary admission to inpatient psychiatric unit for safety, stabilization and treatment - Daily contact with patient to assess and evaluate symptoms and progress in treatment and Medication management - Precautions: suicide - Continue COWS monitoring and comfort medications for acute withdrawal sx - Continue Lexapro 20mg  PO daily for MDD - Continue melatonin 5mg  PO at bedtime for sleep - Continue trazodone 100mg  PO at bedtime for insomnia - Continue Dulera inhaler BID - Encouraged patient to participate in unit milieu and in scheduled group therapies  - Social work consulted to assist with discharge planning and identification of hospital follow-up needs - Discharge goals: long-term substance use treatment program  Kvon Mcilhenny E Shanya Ferriss, NP 01/03/2024, 8:02 AM

## 2024-01-04 DIAGNOSIS — F331 Major depressive disorder, recurrent, moderate: Secondary | ICD-10-CM | POA: Diagnosis not present

## 2024-01-04 NOTE — Group Note (Signed)
LCSW Group Therapy Note   Group Date: 01/04/2024 Start Time: 1330 End Time: 1405   Type of Therapy and Topic:  Group Therapy: Goals after Discharge  Participation Level:  Active     Summary of Patient Progress:  The patient attended group. Patient proved open to input from peers and feedback from [facility]. The patient was respectful of peers and participated throughout the entire session. The patient participated during today's icebreaker question. The patient stated getting into a treatment program. The patient is motivated to get out and make changes to jis life. Stating that he wants to get it together.     Marshell Levan, LCSWA 01/04/2024  2:28 PM

## 2024-01-04 NOTE — Progress Notes (Signed)
 [facility] MD Progress Note  Corey House.  MRN:  [id number] Subjective:  Corey House is a 58 year old Caucasian male with a history of depression here voluntarily for suicidal ideation in the context of increased psychosocial stressors.  Chart reviewed, case discussed with multidisciplinary team, and patient seen during rounds. Today, Corey House presents with restlessness, irritability, depressed mood, and increased anxiety. Reports experiencing significant withdrawal symptoms including body aches, leg pain, rhinorrhea, and restlessness. States that he wants to leave to go get Suboxone because he is feeling awful. He states he no longer wants to be on this earth multiple times.Ruminating thoughts of his family members who have died. Reports feeling anxious and scared. He denies thoughts of harming others.  Principal Problem: Major depressive disorder, recurrent episode, moderate (HCC) Diagnosis:  Principal Problem:   Major depressive disorder, recurrent episode, moderate (HCC) Active Problems:   Benzodiazepine abuse (HCC)   Suicidal ideations   Buprenorphine and naloxone withdrawal (HCC)   Total Time spent with patient: 30 minutes  Past Psychiatric History: see H&P  Past Medical History:  Past Medical History:  Diagnosis Date   Chronic cough    Urolithiasis    Urolithiasis     Past Surgical History:  Procedure Laterality Date   CATARACT EXTRACTION W/PHACO Left 08/09/2021   Procedure: CATARACT EXTRACTION PHACO AND INTRAOCULAR LENS PLACEMENT (IOC) LEFT .051 00:10.0;  Surgeon: Lockie Mola, MD;  Location: [facility];  Service: Ophthalmology;  Laterality: Left;   CATARACT EXTRACTION W/PHACO Right 10/04/2021   Procedure: CATARACT EXTRACTION PHACO AND INTRAOCULAR LENS PLACEMENT (IOC) RIGHT 0.67 00:13.8;  Surgeon: Lockie Mola, MD;  Location: [facility];  Service: Ophthalmology;  Laterality: Right;  general anesthesia needs to be last case   HERNIA REPAIR      x2   MANDIBLE FRACTURE SURGERY     Family History: History reviewed. No pertinent family history. Family Psychiatric  History: Significant for substance use disorder; see H&P Social History:  Social History   Substance and Sexual Activity  Alcohol Use No     Social History   Substance and Sexual Activity  Drug Use Not on file    Social History   Socioeconomic History   Marital status: Single    Spouse name: Not on file   Number of children: Not on file   Years of education: Not on file   Highest education level: Not on file  Occupational History   Not on file  Tobacco Use   Smoking status: Every Day    Current packs/day: 1.00    Average packs/day: 1 pack/day for 35.0 years (35.0 ttl pk-yrs)    Types: Cigarettes   Smokeless tobacco: Never   Tobacco comments:    Started smoking age 30  Vaping Use   Vaping status: Never Used  Substance and Sexual Activity   Alcohol use: No   Drug use: Not on file   Sexual activity: Not on file  Other Topics Concern   Not on file  Social History Narrative   Not on file   Social Drivers of Health   Financial Resource Strain: Not on file  Food Insecurity: Patient Declined (12/27/2023)   Hunger Vital Sign    Worried About Running Out of Food in the Last Year: Patient declined    Ran Out of Food in the Last Year: Patient declined  Recent Concern: Food Insecurity - Food Insecurity Present (12/27/2023)   Hunger Vital Sign    Worried About Running Out of Food in the Last  Year: Patient declined    Barista in the Last Year: Sometimes true  Transportation Needs: Patient Declined (12/27/2023)   PRAPARE - Administrator, Civil Service (Medical): Patient declined    Lack of Transportation (Non-Medical): Patient declined  Physical Activity: Not on file  Stress: Not on file  Social Connections: Not on file   Additional Social History:                         Sleep: Poor  Appetite:  Poor  Current  Medications: Current Facility-Administered Medications  Medication Dose Route Frequency Provider Last Rate Last Admin   acetaminophen (TYLENOL) tablet 650 mg  650 mg Oral Q6H PRN Penn, Cicely, NP   650 mg at 01/02/24 0958   albuterol (VENTOLIN HFA) 108 (90 Base) MCG/ACT inhaler 2 puff  2 puff Inhalation Q6H Mikey College T, MD   2 puff at 01/03/24 1632   alum & mag hydroxide-simeth (MAALOX/MYLANTA) 200-200-20 MG/5ML suspension 30 mL  30 mL Oral Q4H PRN Penn, Cranston Neighbor, NP       aspirin EC tablet 81 mg  81 mg Oral Daily Mikey College T, MD   81 mg at 01/04/24 4010   benzonatate (TESSALON) capsule 100 mg  100 mg Oral TID PRN Mikey College T, MD   100 mg at 01/03/24 2725   cloNIDine (CATAPRES) tablet 0.1 mg  0.1 mg Oral TID Darrold Junker E, NP   0.1 mg at 01/04/24 1300   dicyclomine (BENTYL) tablet 20 mg  20 mg Oral Q6H PRN Adlai Nieblas E, NP       haloperidol (HALDOL) tablet 5 mg  5 mg Oral TID PRN Mcneil Sober, NP   5 mg at 01/03/24 0631   And   diphenhydrAMINE (BENADRYL) capsule 50 mg  50 mg Oral TID PRN Mcneil Sober, NP   50 mg at 01/03/24 0631   escitalopram (LEXAPRO) tablet 20 mg  20 mg Oral Daily Myriam Forehand, NP   20 mg at 01/04/24 3664   feeding supplement (ENSURE ENLIVE / ENSURE PLUS) liquid 237 mL  237 mL Oral BID BM Myriam Forehand, NP   237 mL at 01/03/24 1400   folic acid (FOLVITE) tablet 1 mg  1 mg Oral Daily Myriam Forehand, NP   1 mg at 01/04/24 4034   guaiFENesin (ROBITUSSIN) 100 MG/5ML liquid 5 mL  5 mL Oral Q6H PRN Mikey College T, MD   5 mL at 12/31/23 1835   hydrOXYzine (ATARAX) tablet 50 mg  50 mg Oral TID PRN Darrold Junker E, NP   50 mg at 01/03/24 7425   loperamide (IMODIUM) capsule 2-4 mg  2-4 mg Oral PRN Azai Gaffin E, NP       magnesium hydroxide (MILK OF MAGNESIA) suspension 30 mL  30 mL Oral Daily PRN Penn, Cicely, NP       melatonin tablet 5 mg  5 mg Oral QHS Myriam Forehand, NP   5 mg at 01/03/24 2139   methocarbamol (ROBAXIN) tablet 500 mg  500 mg Oral Q8H PRN  Darrold Junker E, NP   500 mg at 01/03/24 1619   mometasone-formoterol (DULERA) 100-5 MCG/ACT inhaler 2 puff  2 puff Inhalation BID Emeline General, MD   2 puff at 01/03/24 0910   multivitamin with minerals tablet 1 tablet  1 tablet Oral Daily Myriam Forehand, NP   1 tablet at 01/04/24 0937   naproxen (NAPROSYN) tablet  500 mg  500 mg Oral BID PRN Darrold Junker E, NP   500 mg at 01/03/24 1619   nicotine (NICODERM CQ - dosed in mg/24 hours) patch 14 mg  14 mg Transdermal Daily Myriam Forehand, NP   14 mg at 01/04/24 0940   ondansetron (ZOFRAN-ODT) disintegrating tablet 4 mg  4 mg Oral Q6H PRN Darrold Junker E, NP       rOPINIRole (REQUIP) tablet 1 mg  1 mg Oral QHS Roma Bierlein E, NP   1 mg at 01/03/24 2139   thiamine (VITAMIN B1) tablet 100 mg  100 mg Oral Daily Myriam Forehand, NP   100 mg at 01/04/24 2440   Or   thiamine (VITAMIN B1) injection 100 mg  100 mg Intravenous Daily Myriam Forehand, NP       traZODone (DESYREL) tablet 100 mg  100 mg Oral QHS Jeily Guthridge E, NP   100 mg at 01/03/24 2139    Lab Results: No results found for this or any previous visit (from the past 48 hours).  Blood Alcohol level:  Lab Results  Component Value Date   ETH <10 12/27/2023    Metabolic Disorder Labs: Lab Results  Component Value Date   HGBA1C 6.1 (H) 12/29/2023   MPG 128.37 12/29/2023   No results found for: "PROLACTIN" Lab Results  Component Value Date   CHOL 155 12/29/2023   TRIG 114 12/29/2023   HDL 31 (L) 12/29/2023   CHOLHDL 5.0 12/29/2023   VLDL 23 12/29/2023   LDLCALC 101 (H) 12/29/2023    Physical Findings: AIMS:  , ,  ,  ,    CIWA:  CIWA-Ar Total: 16 COWS:  COWS Total Score: 5  Musculoskeletal: Strength & Muscle Tone: within normal limits Gait & Station: normal Patient leans: N/A  Psychiatric Specialty Exam:  Presentation  General Appearance:  Fairly Groomed  Eye Contact: Good  Speech: Clear and Coherent  Speech  Volume: Increased  Handedness: Right   Mood and Affect  Mood: Anxious; Depressed; Irritable; Hopeless  Affect: Congruent; Depressed; Flat   Thought Process  Thought Processes: Coherent  Descriptions of Associations: Circumstantial  Orientation: Full (Time, Place and Person)  Thought Content: Logical; Rumination  History of Schizophrenia/Schizoaffective disorder: No  Duration of Psychotic Symptoms:No data recorded Hallucinations: No data recorded  Ideas of Reference: None  Suicidal Thoughts: No data recorded  Homicidal Thoughts: No data recorded   Sensorium  Memory: Immediate Good; Remote Good; Recent Good  Judgment: Fair  Insight: Fair   Art therapist  Concentration: Fair  Attention Span: Fair  Recall: [employer] of Knowledge: Fair  Language: Fair   Psychomotor Activity  Psychomotor Activity: No data recorded   Assets  Assets: Communication Skills   Sleep  Sleep: No data recorded    Physical Exam: Physical Exam Vitals and nursing note reviewed.  HENT:     Head: Normocephalic and atraumatic.     Nose: Rhinorrhea present.  Pulmonary:     Effort: Pulmonary effort is normal.  Musculoskeletal:        General: Normal range of motion.     Cervical back: Normal range of motion.  Skin:    General: Skin is warm and dry.  Neurological:     Mental Status: He is alert and oriented to person, place, and time.    Review of Systems  Constitutional:  Positive for chills.  Respiratory:  Positive for cough.   Gastrointestinal:  Positive for nausea.  Musculoskeletal:  Positive  for back pain and joint pain.  Neurological:  Positive for headaches.  Psychiatric/Behavioral:  Positive for depression, substance abuse and suicidal ideas. The patient is nervous/anxious and has insomnia.    Blood pressure 125/87, pulse 80, temperature 98.3 F (36.8 C), temperature source Oral, resp. rate 16, height 5\' 7"  (1.702 m), weight  51.3 kg, SpO2 99%. Body mass index is 17.7 kg/m.   Treatment Plan Summary: - Voluntary admission to inpatient psychiatric unit for safety, stabilization and treatment - Daily contact with patient to assess and evaluate symptoms and progress in treatment and Medication management - Precautions: suicide, detox - Continue COWS monitoring and comfort medications for acute withdrawal sx - Continue Lexapro 20mg  PO daily for MDD - Continue melatonin 5mg  PO at bedtime for sleep - Continue trazodone 100mg  PO at bedtime for insomnia - Continue Dulera inhaler BID - Encouraged patient to participate in unit milieu and in scheduled group therapies  - Provided encouragement and psychoeducation - Social work consulted to assist with discharge planning and identification of hospital follow-up needs - Discharge goals: long-term substance use treatment program  Jenasis Straley Robyne Peers, NP

## 2024-01-04 NOTE — Progress Notes (Signed)
   01/03/24 1944  Psych Admission Type (Psych Patients Only)  Admission Status Voluntary  Psychosocial Assessment  Patient Complaints Anxiety;Hopelessness  Eye Contact Brief  Facial Expression Worried  Affect Anxious  Speech Soft  Interaction Minimal  Motor Activity Slow;Fidgety  Appearance/Hygiene In scrubs  Behavior Characteristics Cooperative  Mood Sad;Anxious;Depressed  Aggressive Behavior  Effect No apparent injury  Thought Process  Coherency WDL  Content WDL  Delusions None reported or observed  Perception WDL  Hallucination None reported or observed  Judgment Impaired  Confusion Mild  Danger to Self  Current suicidal ideation? Denies  Agreement Not to Harm Self Yes  Danger to Others  Danger to Others None reported or observed

## 2024-01-04 NOTE — Progress Notes (Signed)
[employer] MD Progress Note  01/03/2024 1200 Darcella Gasman Doran Clay.  MRN:  [id number]  Subjective:   58 year old Caucasian male presented with suicidal ideation (SI) and a plan to hang himself, reporting worsening symptoms of depression over the past month. He describes feeling increasingly hopeless and helpless, with escalation of symptoms in the past few days; suicidal ideation: Persistent for the past month, worsening over the last few days.   Chart reviewed, case discussed with interdisciplinary team, and patient seen in rounds today. Nichalos continues to report not feeling well due to buprenorphine withdrawal. He has taken a couple doses of Clonidine in the past 24 hours, but not much relief. He has no appetite but drinks Ensure and gatorade. He continues to endorse passive SI without plan or intent. He is hyperverbal and loud today. Complains of poor sleep. Today states he would like to go to rehab. Has finished course of prednisone treatment, which I suspect was contributing to irritability and anxiety.   Principal Problem: Major depressive disorder, recurrent episode, moderate (HCC) Diagnosis: Principal Problem:   Major depressive disorder, recurrent episode, moderate (HCC) Active Problems:   Benzodiazepine abuse (HCC)   Suicidal ideations   Buprenorphine and naloxone withdrawal (HCC)  Total Time spent with patient: 30 minutes  Past Psychiatric History: see H&P  Past Medical History:  Past Medical History:  Diagnosis Date   Chronic cough    Urolithiasis    Urolithiasis     Past Surgical History:  Procedure Laterality Date   CATARACT EXTRACTION W/PHACO Left 08/09/2021   Procedure: CATARACT EXTRACTION PHACO AND INTRAOCULAR LENS PLACEMENT (IOC) LEFT .051 00:10.0;  Surgeon: Lockie Mola, MD;  Location: [facility];  Service: Ophthalmology;  Laterality: Left;   CATARACT EXTRACTION W/PHACO Right 10/04/2021   Procedure: CATARACT EXTRACTION PHACO AND INTRAOCULAR LENS PLACEMENT  (IOC) RIGHT 0.67 00:13.8;  Surgeon: Lockie Mola, MD;  Location: [facility];  Service: Ophthalmology;  Laterality: Right;  general anesthesia needs to be last case   HERNIA REPAIR     x2   MANDIBLE FRACTURE SURGERY     Family History: History reviewed. No pertinent family history. Family Psychiatric  History: see h&p Social History:  Social History   Substance and Sexual Activity  Alcohol Use No     Social History   Substance and Sexual Activity  Drug Use Not on file    Social History   Socioeconomic History   Marital status: Single    Spouse name: Not on file   Number of children: Not on file   Years of education: Not on file   Highest education level: Not on file  Occupational History   Not on file  Tobacco Use   Smoking status: Every Day    Current packs/day: 1.00    Average packs/day: 1 pack/day for 35.0 years (35.0 ttl pk-yrs)    Types: Cigarettes   Smokeless tobacco: Never   Tobacco comments:    Started smoking age 63  Vaping Use   Vaping status: Never Used  Substance and Sexual Activity   Alcohol use: No   Drug use: Not on file   Sexual activity: Not on file  Other Topics Concern   Not on file  Social History Narrative   Not on file   Social Drivers of Health   Financial Resource Strain: Not on file  Food Insecurity: Patient Declined (12/27/2023)   Hunger Vital Sign    Worried About Running Out of Food in the Last Year: Patient declined  Ran Out of Food in the Last Year: Patient declined  Recent Concern: Food Insecurity - Food Insecurity Present (12/27/2023)   Hunger Vital Sign    Worried About Running Out of Food in the Last Year: Patient declined    Ran Out of Food in the Last Year: Sometimes true  Transportation Needs: Patient Declined (12/27/2023)   PRAPARE - Administrator, Civil Service (Medical): Patient declined    Lack of Transportation (Non-Medical): Patient declined  Physical Activity: Not on file  Stress:  Not on file  Social Connections: Not on file   Additional Social History:                         Sleep: Poor  Appetite:  Fair  Current Medications: Current Facility-Administered Medications  Medication Dose Route Frequency Provider Last Rate Last Admin   acetaminophen (TYLENOL) tablet 650 mg  650 mg Oral Q6H PRN Penn, Cicely, NP   650 mg at 01/02/24 0958   albuterol (VENTOLIN HFA) 108 (90 Base) MCG/ACT inhaler 2 puff  2 puff Inhalation Q6H Mikey College T, MD   2 puff at 01/03/24 1632   alum & mag hydroxide-simeth (MAALOX/MYLANTA) 200-200-20 MG/5ML suspension 30 mL  30 mL Oral Q4H PRN Penn, Cranston Neighbor, NP       aspirin EC tablet 81 mg  81 mg Oral Daily Mikey College T, MD   81 mg at 01/04/24 5638   benzonatate (TESSALON) capsule 100 mg  100 mg Oral TID PRN Mikey College T, MD   100 mg at 01/03/24 7564   cloNIDine (CATAPRES) tablet 0.1 mg  0.1 mg Oral TID Darrold Junker E, NP   0.1 mg at 01/04/24 1300   dicyclomine (BENTYL) tablet 20 mg  20 mg Oral Q6H PRN Roger Fasnacht E, NP       haloperidol (HALDOL) tablet 5 mg  5 mg Oral TID PRN Mcneil Sober, NP   5 mg at 01/03/24 0631   And   diphenhydrAMINE (BENADRYL) capsule 50 mg  50 mg Oral TID PRN Mcneil Sober, NP   50 mg at 01/03/24 0631   escitalopram (LEXAPRO) tablet 20 mg  20 mg Oral Daily Myriam Forehand, NP   20 mg at 01/04/24 3329   feeding supplement (ENSURE ENLIVE / ENSURE PLUS) liquid 237 mL  237 mL Oral BID BM Myriam Forehand, NP   237 mL at 01/03/24 1400   folic acid (FOLVITE) tablet 1 mg  1 mg Oral Daily Myriam Forehand, NP   1 mg at 01/04/24 5188   guaiFENesin (ROBITUSSIN) 100 MG/5ML liquid 5 mL  5 mL Oral Q6H PRN Mikey College T, MD   5 mL at 12/31/23 1835   hydrOXYzine (ATARAX) tablet 50 mg  50 mg Oral TID PRN Darrold Junker E, NP   50 mg at 01/03/24 4166   loperamide (IMODIUM) capsule 2-4 mg  2-4 mg Oral PRN Markise Haymer E, NP       magnesium hydroxide (MILK OF MAGNESIA) suspension 30 mL  30 mL Oral Daily PRN Penn, Cranston Neighbor,  NP       melatonin tablet 5 mg  5 mg Oral QHS Myriam Forehand, NP   5 mg at 01/03/24 2139   methocarbamol (ROBAXIN) tablet 500 mg  500 mg Oral Q8H PRN Darrold Junker E, NP   500 mg at 01/03/24 1619   mometasone-formoterol (DULERA) 100-5 MCG/ACT inhaler 2 puff  2 puff Inhalation BID Mikey College T,  MD   2 puff at 01/03/24 0910   multivitamin with minerals tablet 1 tablet  1 tablet Oral Daily Myriam Forehand, NP   1 tablet at 01/04/24 1610   naproxen (NAPROSYN) tablet 500 mg  500 mg Oral BID PRN Darrold Junker E, NP   500 mg at 01/03/24 1619   nicotine (NICODERM CQ - dosed in mg/24 hours) patch 14 mg  14 mg Transdermal Daily Myriam Forehand, NP   14 mg at 01/04/24 0940   ondansetron (ZOFRAN-ODT) disintegrating tablet 4 mg  4 mg Oral Q6H PRN Darrold Junker E, NP       rOPINIRole (REQUIP) tablet 1 mg  1 mg Oral QHS Aliviyah Malanga E, NP   1 mg at 01/03/24 2139   thiamine (VITAMIN B1) tablet 100 mg  100 mg Oral Daily Myriam Forehand, NP   100 mg at 01/04/24 9604   Or   thiamine (VITAMIN B1) injection 100 mg  100 mg Intravenous Daily Myriam Forehand, NP       traZODone (DESYREL) tablet 100 mg  100 mg Oral QHS Rashawd Laskaris E, NP   100 mg at 01/03/24 2139    Lab Results: No results found for this or any previous visit (from the past 48 hours).  Blood Alcohol level:  Lab Results  Component Value Date   ETH <10 12/27/2023    Metabolic Disorder Labs: Lab Results  Component Value Date   HGBA1C 6.1 (H) 12/29/2023   MPG 128.37 12/29/2023   No results found for: "PROLACTIN" Lab Results  Component Value Date   CHOL 155 12/29/2023   TRIG 114 12/29/2023   HDL 31 (L) 12/29/2023   CHOLHDL 5.0 12/29/2023   VLDL 23 12/29/2023   LDLCALC 101 (H) 12/29/2023    Physical Findings: AIMS:  , ,  ,  ,    CIWA:  CIWA-Ar Total: 16 COWS:  COWS Total Score: 5  Musculoskeletal: Strength & Muscle Tone: within normal limits Gait & Station: normal Patient leans: N/A  Psychiatric Specialty  Exam:  Presentation  General Appearance:  Fairly Groomed  Eye Contact: Good  Speech: Clear and Coherent  Speech Volume: Increased  Handedness: Right   Mood and Affect  Mood: Anxious; Depressed; Irritable; Hopeless  Affect: Congruent; Depressed; Flat   Thought Process  Thought Processes: Coherent  Descriptions of Associations:Circumstantial  Orientation:Full (Time, Place and Person)  Thought Content:Logical; Rumination  History of Schizophrenia/Schizoaffective disorder:No  Duration of Psychotic Symptoms:No data recorded Hallucinations:No data recorded  Ideas of Reference:None  Suicidal Thoughts:No data recorded  Homicidal Thoughts:No data recorded   Sensorium  Memory: Immediate Good; Remote Good; Recent Good  Judgment: Fair  Insight: Fair   Art therapist  Concentration: Fair  Attention Span: Fair  Recall: [employer] of Knowledge: Fair  Language: Fair   Psychomotor Activity  Psychomotor Activity:No data recorded   Assets  Assets: Communication Skills   Sleep  Sleep:No data recorded    Physical Exam: Physical Exam Vitals and nursing note reviewed.  HENT:     Nose: Rhinorrhea present. No congestion.  Cardiovascular:     Rate and Rhythm: Normal rate.  Pulmonary:     Effort: Pulmonary effort is normal.  Musculoskeletal:        General: Normal range of motion.     Cervical back: Normal range of motion.  Skin:    General: Skin is warm and dry.  Neurological:     Mental Status: He is alert and oriented to person,  place, and time.    Review of Systems  Respiratory:  Positive for cough.   Gastrointestinal:  Positive for nausea.  Musculoskeletal:  Positive for joint pain.  Psychiatric/Behavioral:  Positive for depression, substance abuse and suicidal ideas. The patient is nervous/anxious and has insomnia.    Blood pressure 125/87, pulse 80, temperature 98.3 F (36.8 C), temperature source Oral, resp. rate  16, height 5\' 7"  (1.702 m), weight 51.3 kg, SpO2 99%. Body mass index is 17.7 kg/m.   Treatment Plan Summary: - Voluntary admission to inpatient psychiatric unit for safety, stabilization and treatment - Daily contact with patient to assess and evaluate symptoms and progress in treatment and Medication management - Precautions: suicide - Continue COWS monitoring and comfort medications for acute withdrawal sx - Continue Lexapro 20mg  PO daily for MDD - Continue melatonin 5mg  PO at bedtime for sleep - Continue trazodone 100mg  PO at bedtime for insomnia - Continue Dulera inhaler BID - Continue ASA 81mg  PO daily - Start clonidine 0.1mg  PO TID for opioid withdrawal - Encouraged patient to participate in unit milieu and in scheduled group therapies  - Social work consulted to assist with discharge planning and identification of hospital follow-up needs - Discharge goals: long-term substance use treatment program  Gaynor Ferreras Robyne Peers, NP

## 2024-01-04 NOTE — Progress Notes (Signed)
   01/04/24 0938  Psych Admission Type (Psych Patients Only)  Admission Status Voluntary  Psychosocial Assessment  Patient Complaints Anxiety;Substance abuse  Eye Contact Fair  Facial Expression Anxious  Affect Anxious;Preoccupied  Speech Logical/coherent  Interaction Assertive  Motor Activity Slow  Appearance/Hygiene In scrubs  Behavior Characteristics Cooperative;Anxious  Mood Anxious;Preoccupied  Thought Process  Coherency WDL  Content WDL  Delusions None reported or observed  Perception WDL  Hallucination None reported or observed  Judgment Impaired  Confusion None  Danger to Self  Current suicidal ideation? Denies  Agreement Not to Harm Self Yes  Description of Agreement Verbal  Danger to Others  Danger to Others None reported or observed

## 2024-01-04 NOTE — Progress Notes (Signed)
[facility] MD Progress Note   Elmor Bonfante.  MRN:  [id number]  Subjective:   58 year old Caucasian male presented with suicidal ideation (SI) and a plan to hang himself, reporting worsening symptoms of depression over the past month. He describes feeling increasingly hopeless and helpless, with escalation of symptoms in the past few days; suicidal ideation: Persistent for the past month, worsening over the last few days.   Chart reviewed, case discussed with interdisciplinary team, and patient seen in rounds today. Cadarius continues to report not feeling well due to buprenorphine withdrawal. However, he appears less restless today and has attended group. He continues to endorse passive SI with no intent, plan, or means. Notable mood improvement. Discharge planning in process. Patient would like to go to rehab.  Principal Problem: Major depressive disorder, recurrent episode, moderate (HCC) Diagnosis: Principal Problem:   Major depressive disorder, recurrent episode, moderate (HCC) Active Problems:   Benzodiazepine abuse (HCC)   Suicidal ideations   Buprenorphine and naloxone withdrawal (HCC)  Total Time spent with patient: 30 minutes  Past Psychiatric History: see H&P  Past Medical History:  Past Medical History:  Diagnosis Date   Chronic cough    Urolithiasis    Urolithiasis     Past Surgical History:  Procedure Laterality Date   CATARACT EXTRACTION W/PHACO Left 08/09/2021   Procedure: CATARACT EXTRACTION PHACO AND INTRAOCULAR LENS PLACEMENT (IOC) LEFT .051 00:10.0;  Surgeon: Lockie Mola, MD;  Location: [facility];  Service: Ophthalmology;  Laterality: Left;   CATARACT EXTRACTION W/PHACO Right 10/04/2021   Procedure: CATARACT EXTRACTION PHACO AND INTRAOCULAR LENS PLACEMENT (IOC) RIGHT 0.67 00:13.8;  Surgeon: Lockie Mola, MD;  Location: [facility];  Service: Ophthalmology;  Laterality: Right;  general anesthesia needs to be last case   HERNIA  REPAIR     x2   MANDIBLE FRACTURE SURGERY     Family History: History reviewed. No pertinent family history. Family Psychiatric  History: see h&p Social History:  Social History   Substance and Sexual Activity  Alcohol Use No     Social History   Substance and Sexual Activity  Drug Use Not on file    Social History   Socioeconomic History   Marital status: Single    Spouse name: Not on file   Number of children: Not on file   Years of education: Not on file   Highest education level: Not on file  Occupational History   Not on file  Tobacco Use   Smoking status: Every Day    Current packs/day: 1.00    Average packs/day: 1 pack/day for 35.0 years (35.0 ttl pk-yrs)    Types: Cigarettes   Smokeless tobacco: Never   Tobacco comments:    Started smoking age 80  Vaping Use   Vaping status: Never Used  Substance and Sexual Activity   Alcohol use: No   Drug use: Not on file   Sexual activity: Not on file  Other Topics Concern   Not on file  Social History Narrative   Not on file   Social Drivers of Health   Financial Resource Strain: Not on file  Food Insecurity: Patient Declined (12/27/2023)   Hunger Vital Sign    Worried About Running Out of Food in the Last Year: Patient declined    Ran Out of Food in the Last Year: Patient declined  Recent Concern: Food Insecurity - Food Insecurity Present (12/27/2023)   Hunger Vital Sign    Worried About Programme researcher, broadcasting/film/video in  the Last Year: Patient declined    Ran Out of Food in the Last Year: Sometimes true  Transportation Needs: Patient Declined (12/27/2023)   PRAPARE - Administrator, Civil Service (Medical): Patient declined    Lack of Transportation (Non-Medical): Patient declined  Physical Activity: Not on file  Stress: Not on file  Social Connections: Not on file   Additional Social History:                         Sleep: Poor  Appetite:  Fair  Current Medications: Current  Facility-Administered Medications  Medication Dose Route Frequency Provider Last Rate Last Admin   acetaminophen (TYLENOL) tablet 650 mg  650 mg Oral Q6H PRN Penn, Cicely, NP   650 mg at 01/04/24 1748   albuterol (VENTOLIN HFA) 108 (90 Base) MCG/ACT inhaler 2 puff  2 puff Inhalation Q6H Mikey College T, MD   2 puff at 01/03/24 1632   alum & mag hydroxide-simeth (MAALOX/MYLANTA) 200-200-20 MG/5ML suspension 30 mL  30 mL Oral Q4H PRN Penn, Cranston Neighbor, NP       aspirin EC tablet 81 mg  81 mg Oral Daily Mikey College T, MD   81 mg at 01/04/24 2130   benzonatate (TESSALON) capsule 100 mg  100 mg Oral TID PRN Mikey College T, MD   100 mg at 01/03/24 8657   cloNIDine (CATAPRES) tablet 0.1 mg  0.1 mg Oral TID Darrold Junker E, NP   0.1 mg at 01/04/24 1747   dicyclomine (BENTYL) tablet 20 mg  20 mg Oral Q6H PRN Egor Fullilove E, NP       haloperidol (HALDOL) tablet 5 mg  5 mg Oral TID PRN Mcneil Sober, NP   5 mg at 01/03/24 0631   And   diphenhydrAMINE (BENADRYL) capsule 50 mg  50 mg Oral TID PRN Mcneil Sober, NP   50 mg at 01/03/24 0631   escitalopram (LEXAPRO) tablet 20 mg  20 mg Oral Daily Myriam Forehand, NP   20 mg at 01/04/24 8469   feeding supplement (ENSURE ENLIVE / ENSURE PLUS) liquid 237 mL  237 mL Oral BID BM Myriam Forehand, NP   237 mL at 01/03/24 1400   folic acid (FOLVITE) tablet 1 mg  1 mg Oral Daily Myriam Forehand, NP   1 mg at 01/04/24 6295   guaiFENesin (ROBITUSSIN) 100 MG/5ML liquid 5 mL  5 mL Oral Q6H PRN Mikey College T, MD   5 mL at 12/31/23 1835   hydrOXYzine (ATARAX) tablet 50 mg  50 mg Oral TID PRN Darrold Junker E, NP   50 mg at 01/03/24 2841   loperamide (IMODIUM) capsule 2-4 mg  2-4 mg Oral PRN Kiah Vanalstine E, NP       magnesium hydroxide (MILK OF MAGNESIA) suspension 30 mL  30 mL Oral Daily PRN Penn, Cranston Neighbor, NP       melatonin tablet 5 mg  5 mg Oral QHS Myriam Forehand, NP   5 mg at 01/03/24 2139   methocarbamol (ROBAXIN) tablet 500 mg  500 mg Oral Q8H PRN Darrold Junker E, NP   500  mg at 01/03/24 1619   mometasone-formoterol (DULERA) 100-5 MCG/ACT inhaler 2 puff  2 puff Inhalation BID Emeline General, MD   2 puff at 01/03/24 0910   multivitamin with minerals tablet 1 tablet  1 tablet Oral Daily Myriam Forehand, NP   1 tablet at 01/04/24 3244   naproxen (  NAPROSYN) tablet 500 mg  500 mg Oral BID PRN Darrold Junker E, NP   500 mg at 01/03/24 1619   nicotine (NICODERM CQ - dosed in mg/24 hours) patch 14 mg  14 mg Transdermal Daily Myriam Forehand, NP   14 mg at 01/04/24 0940   ondansetron (ZOFRAN-ODT) disintegrating tablet 4 mg  4 mg Oral Q6H PRN Darrold Junker E, NP       rOPINIRole (REQUIP) tablet 1 mg  1 mg Oral QHS Amiel Sharrow E, NP   1 mg at 01/03/24 2139   thiamine (VITAMIN B1) tablet 100 mg  100 mg Oral Daily Myriam Forehand, NP   100 mg at 01/04/24 9604   Or   thiamine (VITAMIN B1) injection 100 mg  100 mg Intravenous Daily Myriam Forehand, NP       traZODone (DESYREL) tablet 100 mg  100 mg Oral QHS Coty Larsh E, NP   100 mg at 01/03/24 2139    Lab Results: No results found for this or any previous visit (from the past 48 hours).  Blood Alcohol level:  Lab Results  Component Value Date   ETH <10 12/27/2023    Metabolic Disorder Labs: Lab Results  Component Value Date   HGBA1C 6.1 (H) 12/29/2023   MPG 128.37 12/29/2023   No results found for: "PROLACTIN" Lab Results  Component Value Date   CHOL 155 12/29/2023   TRIG 114 12/29/2023   HDL 31 (L) 12/29/2023   CHOLHDL 5.0 12/29/2023   VLDL 23 12/29/2023   LDLCALC 101 (H) 12/29/2023    Physical Findings: AIMS:  , ,  ,  ,    CIWA:  CIWA-Ar Total: 16 COWS:  COWS Total Score: 4  Musculoskeletal: Strength & Muscle Tone: within normal limits Gait & Station: normal Patient leans: N/A  Psychiatric Specialty Exam:  Presentation  General Appearance:  Fairly Groomed  Eye Contact: Good  Speech: Clear and Coherent  Speech Volume: Increased  Handedness: Right   Mood and Affect   Mood: Anxious; Depressed; Irritable; Hopeless  Affect: Congruent; Depressed; Flat   Thought Process  Thought Processes: Coherent  Descriptions of Associations:Circumstantial  Orientation:Full (Time, Place and Person)  Thought Content:Logical; Rumination  History of Schizophrenia/Schizoaffective disorder:No  Duration of Psychotic Symptoms:No data recorded Hallucinations:No data recorded  Ideas of Reference:None  Suicidal Thoughts:No data recorded  Homicidal Thoughts:No data recorded   Sensorium  Memory: Immediate Good; Remote Good; Recent Good  Judgment: Fair  Insight: Fair   Art therapist  Concentration: Fair  Attention Span: Fair  Recall: [employer] of Knowledge: Fair  Language: Fair   Psychomotor Activity  Psychomotor Activity:No data recorded   Assets  Assets: Communication Skills   Sleep  Sleep:No data recorded    Physical Exam: Physical Exam Vitals and nursing note reviewed.  HENT:     Nose: Rhinorrhea present. No congestion.  Cardiovascular:     Rate and Rhythm: Normal rate.  Pulmonary:     Effort: Pulmonary effort is normal.  Musculoskeletal:        General: Normal range of motion.     Cervical back: Normal range of motion.  Skin:    General: Skin is warm and dry.  Neurological:     Mental Status: He is alert and oriented to person, place, and time.    Review of Systems  Respiratory:  Positive for cough.   Gastrointestinal:  Positive for nausea.  Musculoskeletal:  Positive for joint pain.  Psychiatric/Behavioral:  Positive for depression,  substance abuse and suicidal ideas. The patient is nervous/anxious and has insomnia.    Blood pressure 111/89, pulse 85, temperature 98.3 F (36.8 C), temperature source Oral, resp. rate 16, height 5\' 7"  (1.702 m), weight 51.3 kg, SpO2 97%. Body mass index is 17.7 kg/m.   Treatment Plan Summary: - Voluntary admission to inpatient psychiatric unit for safety,  stabilization and treatment - Daily contact with patient to assess and evaluate symptoms and progress in treatment and Medication management - Precautions: suicide - Continue COWS monitoring and comfort medications for acute withdrawal sx - Continue Lexapro 20mg  PO daily for MDD - Continue melatonin 5mg  PO at bedtime for sleep - Continue trazodone 100mg  PO at bedtime for insomnia - Continue Dulera inhaler BID - Continue ASA 81mg  PO daily - Continue clonidine 0.1mg  PO TID for opioid withdrawal, with plan to taper - Encouraged patient to participate in unit milieu and in scheduled group therapies  - Social work consulted to assist with discharge planning and identification of hospital follow-up needs - Discharge goals: long-term substance use treatment program  Matsuko Kretz Robyne Peers, NP

## 2024-01-04 NOTE — BH IP Treatment Plan (Signed)
Interdisciplinary Treatment and Diagnostic Plan Update  01/04/2024 Time of Session: 10:30 am Corey House. MRN: [id number]  Principal Diagnosis: Major depressive disorder, recurrent episode, moderate (HCC)  Secondary Diagnoses: Principal Problem:   Major depressive disorder, recurrent episode, moderate (HCC) Active Problems:   Benzodiazepine abuse (HCC)   Suicidal ideations   Buprenorphine and naloxone withdrawal (HCC)   Current Medications:  Current Facility-Administered Medications  Medication Dose Route Frequency Provider Last Rate Last Admin   acetaminophen (TYLENOL) tablet 650 mg  650 mg Oral Q6H PRN Penn, Cicely, NP   650 mg at 01/02/24 0958   albuterol (VENTOLIN HFA) 108 (90 Base) MCG/ACT inhaler 2 puff  2 puff Inhalation Q6H Mikey College T, MD   2 puff at 01/03/24 1632   alum & mag hydroxide-simeth (MAALOX/MYLANTA) 200-200-20 MG/5ML suspension 30 mL  30 mL Oral Q4H PRN Penn, Cranston Neighbor, NP       aspirin EC tablet 81 mg  81 mg Oral Daily Mikey College T, MD   81 mg at 01/04/24 5284   benzonatate (TESSALON) capsule 100 mg  100 mg Oral TID PRN Mikey College T, MD   100 mg at 01/03/24 1324   cloNIDine (CATAPRES) tablet 0.1 mg  0.1 mg Oral TID Darrold Junker E, NP   0.1 mg at 01/04/24 4010   dicyclomine (BENTYL) tablet 20 mg  20 mg Oral Q6H PRN Remington, Amber E, NP       haloperidol (HALDOL) tablet 5 mg  5 mg Oral TID PRN Mcneil Sober, NP   5 mg at 01/03/24 0631   And   diphenhydrAMINE (BENADRYL) capsule 50 mg  50 mg Oral TID PRN Mcneil Sober, NP   50 mg at 01/03/24 0631   escitalopram (LEXAPRO) tablet 20 mg  20 mg Oral Daily Myriam Forehand, NP   20 mg at 01/04/24 2725   feeding supplement (ENSURE ENLIVE / ENSURE PLUS) liquid 237 mL  237 mL Oral BID BM Myriam Forehand, NP   237 mL at 01/03/24 1400   folic acid (FOLVITE) tablet 1 mg  1 mg Oral Daily Myriam Forehand, NP   1 mg at 01/04/24 3664   guaiFENesin (ROBITUSSIN) 100 MG/5ML liquid 5 mL  5 mL Oral Q6H PRN Mikey College T, MD   5 mL at  12/31/23 1835   hydrOXYzine (ATARAX) tablet 50 mg  50 mg Oral TID PRN Darrold Junker E, NP   50 mg at 01/03/24 4034   loperamide (IMODIUM) capsule 2-4 mg  2-4 mg Oral PRN Remington, Amber E, NP       magnesium hydroxide (MILK OF MAGNESIA) suspension 30 mL  30 mL Oral Daily PRN Penn, Cranston Neighbor, NP       melatonin tablet 5 mg  5 mg Oral QHS Myriam Forehand, NP   5 mg at 01/03/24 2139   methocarbamol (ROBAXIN) tablet 500 mg  500 mg Oral Q8H PRN Darrold Junker E, NP   500 mg at 01/03/24 1619   mometasone-formoterol (DULERA) 100-5 MCG/ACT inhaler 2 puff  2 puff Inhalation BID Emeline General, MD   2 puff at 01/03/24 0910   multivitamin with minerals tablet 1 tablet  1 tablet Oral Daily Myriam Forehand, NP   1 tablet at 01/04/24 7425   naproxen (NAPROSYN) tablet 500 mg  500 mg Oral BID PRN Darrold Junker E, NP   500 mg at 01/03/24 1619   nicotine (NICODERM CQ - dosed in mg/24 hours) patch 14 mg  14 mg  Transdermal Daily Myriam Forehand, NP   14 mg at 01/04/24 0940   ondansetron (ZOFRAN-ODT) disintegrating tablet 4 mg  4 mg Oral Q6H PRN Darrold Junker E, NP       rOPINIRole (REQUIP) tablet 1 mg  1 mg Oral QHS Remington, Amber E, NP   1 mg at 01/03/24 2139   thiamine (VITAMIN B1) tablet 100 mg  100 mg Oral Daily Myriam Forehand, NP   100 mg at 01/04/24 1610   Or   thiamine (VITAMIN B1) injection 100 mg  100 mg Intravenous Daily Myriam Forehand, NP       traZODone (DESYREL) tablet 100 mg  100 mg Oral QHS Remington, Amber E, NP   100 mg at 01/03/24 2139   PTA Medications: Medications Prior to Admission  Medication Sig Dispense Refill Last Dose/Taking   escitalopram (LEXAPRO) 10 MG tablet Take 1 tablet (10 mg total) by mouth daily. 30 tablet 2    feeding supplement (ENSURE ENLIVE / ENSURE PLUS) LIQD Take 237 mLs by mouth 3 (three) times daily between meals.      Multiple Vitamin (MULTIVITAMIN WITH MINERALS) TABS tablet Take 1 tablet by mouth daily. (Patient not taking: Reported on 12/27/2023)      nicotine  (NICODERM CQ - DOSED IN MG/24 HOURS) 14 mg/24hr patch Place 1 patch (14 mg total) onto the skin daily. (Patient not taking: Reported on 12/27/2023) 28 patch 0     Patient Stressors: Health problems   Substance abuse    Patient Strengths: Motivation for treatment/growth   Treatment Modalities: Medication Management, Group therapy, Case management,  1 to 1 session with clinician, Psychoeducation, Recreational therapy.   Physician Treatment Plan for Primary Diagnosis: Major depressive disorder, recurrent episode, moderate (HCC) Long Term Goal(s): Improvement in symptoms so as ready for discharge   Short Term Goals: Ability to identify changes in lifestyle to reduce recurrence of condition will improve Ability to verbalize feelings will improve Ability to disclose and discuss suicidal ideas Ability to demonstrate self-control will improve Ability to identify and develop effective coping behaviors will improve Ability to maintain clinical measurements within normal limits will improve Compliance with prescribed medications will improve Ability to identify triggers associated with substance abuse/mental health issues will improve  Medication Management: Evaluate patient's response, side effects, and tolerance of medication regimen.  Therapeutic Interventions: 1 to 1 sessions, Unit Group sessions and Medication administration.  Evaluation of Outcomes: Progressing  Physician Treatment Plan for Secondary Diagnosis: Principal Problem:   Major depressive disorder, recurrent episode, moderate (HCC) Active Problems:   Benzodiazepine abuse (HCC)   Suicidal ideations   Buprenorphine and naloxone withdrawal (HCC)  Long Term Goal(s): Improvement in symptoms so as ready for discharge   Short Term Goals: Ability to identify changes in lifestyle to reduce recurrence of condition will improve Ability to verbalize feelings will improve Ability to disclose and discuss suicidal ideas Ability to  demonstrate self-control will improve Ability to identify and develop effective coping behaviors will improve Ability to maintain clinical measurements within normal limits will improve Compliance with prescribed medications will improve Ability to identify triggers associated with substance abuse/mental health issues will improve     Medication Management: Evaluate patient's response, side effects, and tolerance of medication regimen.  Therapeutic Interventions: 1 to 1 sessions, Unit Group sessions and Medication administration.  Evaluation of Outcomes: Progressing   RN Treatment Plan for Primary Diagnosis: Major depressive disorder, recurrent episode, moderate (HCC) Long Term Goal(s): Knowledge of disease and therapeutic regimen to  maintain health will improve  Short Term Goals: Ability to remain free from injury will improve, Ability to verbalize frustration and anger appropriately will improve, Ability to demonstrate self-control, Ability to participate in decision making will improve, Ability to verbalize feelings will improve, Ability to disclose and discuss suicidal ideas, Ability to identify and develop effective coping behaviors will improve, and Compliance with prescribed medications will improve  Medication Management: RN will administer medications as ordered by provider, will assess and evaluate patient's response and provide education to patient for prescribed medication. RN will report any adverse and/or side effects to prescribing provider.  Therapeutic Interventions: 1 on 1 counseling sessions, Psychoeducation, Medication administration, Evaluate responses to treatment, Monitor vital signs and CBGs as ordered, Perform/monitor CIWA, COWS, AIMS and Fall Risk screenings as ordered, Perform wound care treatments as ordered.  Evaluation of Outcomes: Progressing   LCSW Treatment Plan for Primary Diagnosis: Major depressive disorder, recurrent episode, moderate (HCC) Long Term  Goal(s): Safe transition to appropriate next level of care at discharge, Engage patient in therapeutic group addressing interpersonal concerns.  Short Term Goals: Engage patient in aftercare planning with referrals and resources, Increase social support, Increase ability to appropriately verbalize feelings, Increase emotional regulation, Facilitate acceptance of mental health diagnosis and concerns, and Increase skills for wellness and recovery  Therapeutic Interventions: Assess for all discharge needs, 1 to 1 time with Social worker, Explore available resources and support systems, Assess for adequacy in community support network, Educate family and significant other(s) on suicide prevention, Complete Psychosocial Assessment, Interpersonal group therapy.  Evaluation of Outcomes: Progressing   Progress in Treatment: Attending groups: No. Participating in groups: No. Taking medication as prescribed: Yes. Toleration medication: Yes. Family/Significant other contact made: Yes, individual(s) contacted:  Debrah Birchall/sister [phone]) Patient understands diagnosis: Yes. Discussing patient identified problems/goals with staff: Yes. Medical problems stabilized or resolved: Yes. Denies suicidal/homicidal ideation: Yes. Issues/concerns per patient self-inventory: No. Other: None  New problem(s) identified: No, Describe:  none Update 01/04/24: No changes at this time.   New Short Term/Long Term Goal(s): detox, elimination of symptoms of psychosis, medication management for mood stabilization; elimination of SI thoughts; development of comprehensive mental wellness/sobriety plan. Update 01/04/24: The patient stated that he wanted treatment.   Patient Goals: "I don't know" Update 01/04/24:  No changes at this time.   Discharge Plan or Barriers: Patient reports that he plans on returning to his home, however, he knows that he can not stay long-term.  He has requested [name] assistance in getting  established with Medicaid transportation, housing resources and possible employment resources.  Update 01/04/24: No changes at this time.    Reason for Continuation of Hospitalization: Anxiety Depression Medication stabilization Suicidal ideation   Estimated Length of Stay:  1-7 days Update 01/04/24: TBD  Last 3 [place] Suicide Severity Risk Score: Flowsheet Row Admission (Current) from 12/27/2023 in [place] [facility] Most recent reading at 12/27/2023 11:00 PM ED from 12/27/2023 in [facility] at [facility] Most recent reading at 12/27/2023  1:31 PM ED to Hosp-Admission (Discharged) from 01/03/2023 in [facility] Most recent reading at 01/06/2023 10:00 AM  C-SSRS RISK CATEGORY High Risk High Risk Error: Q3, 4, or 5 should not be populated when Q2 is No       Last PHQ 2/9 Scores:     No data to display          Scribe for Treatment Team: Marshell Levan, LCSW 01/04/2024 10:32 AM

## 2024-01-04 NOTE — Plan of Care (Signed)
°  Problem: Education: °Goal: Emotional status will improve °Outcome: Progressing °Goal: Mental status will improve °Outcome: Progressing °Goal: Verbalization of understanding the information provided will improve °Outcome: Progressing °  °

## 2024-01-05 DIAGNOSIS — F331 Major depressive disorder, recurrent, moderate: Secondary | ICD-10-CM | POA: Diagnosis not present

## 2024-01-05 MED ORDER — QUETIAPINE FUMARATE 25 MG PO TABS
50.0000 mg | ORAL_TABLET | Freq: Every evening | ORAL | Status: DC | PRN
  Administered 2024-01-05 – 2024-01-06 (×3): 50 mg via ORAL
  Filled 2024-01-05 (×3): qty 2

## 2024-01-05 MED ORDER — CLONIDINE HCL 0.1 MG PO TABS
0.1000 mg | ORAL_TABLET | Freq: Two times a day (BID) | ORAL | Status: DC
  Administered 2024-01-06 – 2024-01-07 (×3): 0.1 mg via ORAL
  Filled 2024-01-05 (×3): qty 1

## 2024-01-05 NOTE — Plan of Care (Signed)
°  Problem: Education: °Goal: Emotional status will improve °Outcome: Progressing °Goal: Mental status will improve °Outcome: Progressing °Goal: Verbalization of understanding the information provided will improve °Outcome: Progressing °  °

## 2024-01-05 NOTE — Progress Notes (Signed)
[facility] MD Progress Note  01/05/2024 1712 Corey House.  MRN:  [id number]  Subjective:   58 year old Caucasian male presented with suicidal ideation (SI) and a plan to hang himself, reporting worsening symptoms of depression over the past month. He describes feeling increasingly hopeless and helpless, with escalation of symptoms in the past few days; suicidal ideation: Persistent for the past month, worsening over the last few days.   Chart reviewed, case discussed with interdisciplinary team, and patient seen in rounds today. Dimetrius visible in the mileau today and appears significantly less restless. Suboxone cravings have subsided. He is compliant with medications and denies side effects. Complains of racing thoughts at bedtime, difficulty initiating sleep, and frequent awakening due to restless legs. He denies thoughts of harming himself and others today. He is future-oriented and beginning to look at rehab options. He denies perceptual disturbances.   Principal Problem: Major depressive disorder, recurrent episode, moderate (HCC) Diagnosis: Principal Problem:   Major depressive disorder, recurrent episode, moderate (HCC) Active Problems:   Benzodiazepine abuse (HCC)   Suicidal ideations   Buprenorphine and naloxone withdrawal (HCC)  Total Time spent with patient: 30 minutes  Past Psychiatric History: see H&P  Past Medical History:  Past Medical History:  Diagnosis Date   Chronic cough    Urolithiasis    Urolithiasis     Past Surgical History:  Procedure Laterality Date   CATARACT EXTRACTION W/PHACO Left 08/09/2021   Procedure: CATARACT EXTRACTION PHACO AND INTRAOCULAR LENS PLACEMENT (IOC) LEFT .051 00:10.0;  Surgeon: Lockie Mola, MD;  Location: [facility];  Service: Ophthalmology;  Laterality: Left;   CATARACT EXTRACTION W/PHACO Right 10/04/2021   Procedure: CATARACT EXTRACTION PHACO AND INTRAOCULAR LENS PLACEMENT (IOC) RIGHT 0.67 00:13.8;  Surgeon: Lockie Mola, MD;  Location: [facility];  Service: Ophthalmology;  Laterality: Right;  general anesthesia needs to be last case   HERNIA REPAIR     x2   MANDIBLE FRACTURE SURGERY     Family History: History reviewed. No pertinent family history. Family Psychiatric  History: see h&p Social History:  Social History   Substance and Sexual Activity  Alcohol Use No     Social History   Substance and Sexual Activity  Drug Use Not on file    Social History   Socioeconomic History   Marital status: Single    Spouse name: Not on file   Number of children: Not on file   Years of education: Not on file   Highest education level: Not on file  Occupational History   Not on file  Tobacco Use   Smoking status: Every Day    Current packs/day: 1.00    Average packs/day: 1 pack/day for 35.0 years (35.0 ttl pk-yrs)    Types: Cigarettes   Smokeless tobacco: Never   Tobacco comments:    Started smoking age 4  Vaping Use   Vaping status: Never Used  Substance and Sexual Activity   Alcohol use: No   Drug use: Not on file   Sexual activity: Not on file  Other Topics Concern   Not on file  Social History Narrative   Not on file   Social Drivers of Health   Financial Resource Strain: Not on file  Food Insecurity: Patient Declined (12/27/2023)   Hunger Vital Sign    Worried About Running Out of Food in the Last Year: Patient declined    Ran Out of Food in the Last Year: Patient declined  Recent Concern: Food Insecurity - Food  Insecurity Present (12/27/2023)   Hunger Vital Sign    Worried About Running Out of Food in the Last Year: Patient declined    Ran Out of Food in the Last Year: Sometimes true  Transportation Needs: Patient Declined (12/27/2023)   PRAPARE - Administrator, Civil Service (Medical): Patient declined    Lack of Transportation (Non-Medical): Patient declined  Physical Activity: Not on file  Stress: Not on file  Social Connections: Not on file    Additional Social History:                         Sleep: Poor  Appetite:  Fair  Current Medications: Current Facility-Administered Medications  Medication Dose Route Frequency Provider Last Rate Last Admin   acetaminophen (TYLENOL) tablet 650 mg  650 mg Oral Q6H PRN Penn, Cicely, NP   650 mg at 01/05/24 1513   albuterol (VENTOLIN HFA) 108 (90 Base) MCG/ACT inhaler 2 puff  2 puff Inhalation Q6H Mikey College T, MD   2 puff at 01/05/24 1239   alum & mag hydroxide-simeth (MAALOX/MYLANTA) 200-200-20 MG/5ML suspension 30 mL  30 mL Oral Q4H PRN Penn, Cranston Neighbor, NP       aspirin EC tablet 81 mg  81 mg Oral Daily Mikey College T, MD   81 mg at 01/05/24 8119   benzonatate (TESSALON) capsule 100 mg  100 mg Oral TID PRN Mikey College T, MD   100 mg at 01/03/24 1478   cloNIDine (CATAPRES) tablet 0.1 mg  0.1 mg Oral TID Darrold Junker E, NP   0.1 mg at 01/05/24 1713   dicyclomine (BENTYL) tablet 20 mg  20 mg Oral Q6H PRN Takasha Vetere E, NP       haloperidol (HALDOL) tablet 5 mg  5 mg Oral TID PRN Mcneil Sober, NP   5 mg at 01/03/24 0631   And   diphenhydrAMINE (BENADRYL) capsule 50 mg  50 mg Oral TID PRN Mcneil Sober, NP   50 mg at 01/03/24 0631   escitalopram (LEXAPRO) tablet 20 mg  20 mg Oral Daily Myriam Forehand, NP   20 mg at 01/05/24 0933   feeding supplement (ENSURE ENLIVE / ENSURE PLUS) liquid 237 mL  237 mL Oral BID BM Myriam Forehand, NP   237 mL at 01/03/24 1400   folic acid (FOLVITE) tablet 1 mg  1 mg Oral Daily Myriam Forehand, NP   1 mg at 01/05/24 0933   guaiFENesin (ROBITUSSIN) 100 MG/5ML liquid 5 mL  5 mL Oral Q6H PRN Mikey College T, MD   5 mL at 12/31/23 1835   hydrOXYzine (ATARAX) tablet 50 mg  50 mg Oral TID PRN Darrold Junker E, NP   50 mg at 01/03/24 2956   loperamide (IMODIUM) capsule 2-4 mg  2-4 mg Oral PRN Kayle Correa E, NP       magnesium hydroxide (MILK OF MAGNESIA) suspension 30 mL  30 mL Oral Daily PRN Penn, Cicely, NP       melatonin tablet 5 mg  5 mg Oral QHS  Myriam Forehand, NP   5 mg at 01/04/24 2206   methocarbamol (ROBAXIN) tablet 500 mg  500 mg Oral Q8H PRN Darrold Junker E, NP   500 mg at 01/03/24 1619   mometasone-formoterol (DULERA) 100-5 MCG/ACT inhaler 2 puff  2 puff Inhalation BID Emeline General, MD   2 puff at 01/03/24 0910   multivitamin with minerals tablet 1 tablet  1 tablet Oral Daily Myriam Forehand, NP   1 tablet at 01/05/24 2536   naproxen (NAPROSYN) tablet 500 mg  500 mg Oral BID PRN Darrold Junker E, NP   500 mg at 01/03/24 1619   nicotine (NICODERM CQ - dosed in mg/24 hours) patch 14 mg  14 mg Transdermal Daily Myriam Forehand, NP   14 mg at 01/05/24 0934   ondansetron (ZOFRAN-ODT) disintegrating tablet 4 mg  4 mg Oral Q6H PRN Darrold Junker E, NP       rOPINIRole (REQUIP) tablet 1 mg  1 mg Oral QHS Windom Cohick E, NP   1 mg at 01/04/24 2206   thiamine (VITAMIN B1) tablet 100 mg  100 mg Oral Daily Myriam Forehand, NP   100 mg at 01/05/24 6440   Or   thiamine (VITAMIN B1) injection 100 mg  100 mg Intravenous Daily Myriam Forehand, NP       traZODone (DESYREL) tablet 100 mg  100 mg Oral QHS Quaid Yeakle E, NP   100 mg at 01/04/24 2206    Lab Results: No results found for this or any previous visit (from the past 48 hours).  Blood Alcohol level:  Lab Results  Component Value Date   ETH <10 12/27/2023    Metabolic Disorder Labs: Lab Results  Component Value Date   HGBA1C 6.1 (H) 12/29/2023   MPG 128.37 12/29/2023   No results found for: "PROLACTIN" Lab Results  Component Value Date   CHOL 155 12/29/2023   TRIG 114 12/29/2023   HDL 31 (L) 12/29/2023   CHOLHDL 5.0 12/29/2023   VLDL 23 12/29/2023   LDLCALC 101 (H) 12/29/2023    Physical Findings: AIMS:  , ,  ,  ,    CIWA:  CIWA-Ar Total: 16 COWS:  COWS Total Score: 3  Musculoskeletal: Strength & Muscle Tone: within normal limits Gait & Station: normal Patient leans: N/A  Psychiatric Specialty Exam:  Presentation  General Appearance:  Fairly  Groomed  Eye Contact: Good  Speech: Clear and Coherent  Speech Volume: Increased  Handedness: Right   Mood and Affect  Mood: Anxious; Depressed; Irritable; Hopeless  Affect: Congruent; Depressed; Flat   Thought Process  Thought Processes: Coherent  Descriptions of Associations:Circumstantial  Orientation:Full (Time, Place and Person)  Thought Content:Logical; Rumination  History of Schizophrenia/Schizoaffective disorder:No  Duration of Psychotic Symptoms:N/A Hallucinations:Denies  Ideas of Reference:None  Suicidal Thoughts: Denies  Homicidal Thoughts: Denies  Sensorium  Memory: Immediate Good; Remote Good; Recent Good  Judgment: Fair  Insight: Fair   Art therapist  Concentration: Fair  Attention Span: Fair  Recall: [employer] of Knowledge: Fair  Language: Fair   Psychomotor Activity  Psychomotor Activity:No data recorded   Assets  Assets: Communication Skills   Sleep  Sleep:No data recorded    Physical Exam: Physical Exam Vitals and nursing note reviewed.  HENT:     Nose: No congestion.  Cardiovascular:     Rate and Rhythm: Normal rate.  Pulmonary:     Effort: Pulmonary effort is normal.  Musculoskeletal:        General: Normal range of motion.     Cervical back: Normal range of motion.  Skin:    General: Skin is warm and dry.  Neurological:     Mental Status: He is alert and oriented to person, place, and time.  Psychiatric:        Behavior: Behavior normal.        Thought Content: Thought content  normal.    Review of Systems  Respiratory:  Positive for cough.   Gastrointestinal:  Negative for nausea.  Musculoskeletal:  Negative for joint pain.  Psychiatric/Behavioral:  Positive for depression and substance abuse. Negative for suicidal ideas. The patient is nervous/anxious and has insomnia.    Blood pressure 104/78, pulse 82, temperature 98.1 F (36.7 C), resp. rate 16, height 5\' 7"  (1.702 m),  weight 51.3 kg, SpO2 98%. Body mass index is 17.7 kg/m.   Treatment Plan Summary:  - Voluntary admission to inpatient psychiatric unit for safety, stabilization and treatment - Daily contact with patient to assess and evaluate symptoms and progress in treatment and Medication management - Precautions: suicide - Continue COWS monitoring and comfort medications for acute withdrawal sx - Continue Lexapro 20mg  PO daily for MDD - Continue melatonin 5mg  PO at bedtime for sleep - Change trazodone 100mg  PO at bedtime PRN insomnia - Continue Dulera inhaler BID - Continue ASA 81mg  PO daily - Decrease clonidine 0.1mg  PO BID for opioid withdrawal, with plan to taper - Start Seroquel 50mg  PO at bedtime for racing thoughts, insomnia --  The risks/benefits/side-effects/alternatives to this medication were discussed in detail with the patient and time was given for questions. The patient consents to medication trial.  - Encouraged patient to participate in unit milieu and in scheduled group therapies  - Social work consulted to assist with discharge planning and identification of hospital follow-up needs - Discharge goals: long-term substance use treatment program; Bernette Redbird will call [facility] on Monday  Annsleigh Dragoo Robyne Peers, NP

## 2024-01-05 NOTE — Group Note (Signed)
Date:  01/05/2024 Time:  9:45 PM  Group Topic/Focus:  Healthy Communication:   The focus of this group is to discuss communication, barriers to communication, as well as healthy ways to communicate with others.    Participation Level:  Did Not Attend  Participation Quality:   none  Affect:   none  Cognitive:   none  Insight: None  Engagement in Group:   none  Modes of Intervention:   none  Additional Comments:  none   Kirby Argueta 01/05/2024, 9:45 PM

## 2024-01-05 NOTE — Progress Notes (Signed)
   01/05/24 0932  Psych Admission Type (Psych Patients Only)  Admission Status Voluntary  Psychosocial Assessment  Patient Complaints Anxiety  Eye Contact Fair  Facial Expression Anxious  Affect Anxious;Preoccupied  Speech Logical/coherent  Interaction Assertive  Motor Activity Slow  Appearance/Hygiene In scrubs  Behavior Characteristics Cooperative;Anxious  Mood Anxious;Preoccupied  Thought Process  Coherency WDL  Content WDL  Delusions None reported or observed  Perception WDL  Hallucination None reported or observed  Judgment Impaired  Confusion None  Danger to Self  Current suicidal ideation? Denies  Agreement Not to Harm Self Yes  Description of Agreement Verbal  Danger to Others  Danger to Others None reported or observed

## 2024-01-05 NOTE — Plan of Care (Signed)
Virat Distad. is a 58 y.o. male patient. No diagnosis found. Past Medical History:  Diagnosis Date   Chronic cough    Urolithiasis    Urolithiasis    Current Facility-Administered Medications  Medication Dose Route Frequency Provider Last Rate Last Admin   acetaminophen (TYLENOL) tablet 650 mg  650 mg Oral Q6H PRN Penn, Cicely, NP   650 mg at 01/04/24 1748   albuterol (VENTOLIN HFA) 108 (90 Base) MCG/ACT inhaler 2 puff  2 puff Inhalation Q6H Mikey College T, MD   2 puff at 01/03/24 1632   alum & mag hydroxide-simeth (MAALOX/MYLANTA) 200-200-20 MG/5ML suspension 30 mL  30 mL Oral Q4H PRN Penn, Cranston Neighbor, NP       aspirin EC tablet 81 mg  81 mg Oral Daily Mikey College T, MD   81 mg at 01/04/24 1610   benzonatate (TESSALON) capsule 100 mg  100 mg Oral TID PRN Mikey College T, MD   100 mg at 01/03/24 9604   cloNIDine (CATAPRES) tablet 0.1 mg  0.1 mg Oral TID Darrold Junker E, NP   0.1 mg at 01/04/24 1747   dicyclomine (BENTYL) tablet 20 mg  20 mg Oral Q6H PRN Remington, Amber E, NP       haloperidol (HALDOL) tablet 5 mg  5 mg Oral TID PRN Mcneil Sober, NP   5 mg at 01/03/24 0631   And   diphenhydrAMINE (BENADRYL) capsule 50 mg  50 mg Oral TID PRN Mcneil Sober, NP   50 mg at 01/03/24 0631   escitalopram (LEXAPRO) tablet 20 mg  20 mg Oral Daily Myriam Forehand, NP   20 mg at 01/04/24 5409   feeding supplement (ENSURE ENLIVE / ENSURE PLUS) liquid 237 mL  237 mL Oral BID BM Myriam Forehand, NP   237 mL at 01/03/24 1400   folic acid (FOLVITE) tablet 1 mg  1 mg Oral Daily Myriam Forehand, NP   1 mg at 01/04/24 8119   guaiFENesin (ROBITUSSIN) 100 MG/5ML liquid 5 mL  5 mL Oral Q6H PRN Mikey College T, MD   5 mL at 12/31/23 1835   hydrOXYzine (ATARAX) tablet 50 mg  50 mg Oral TID PRN Darrold Junker E, NP   50 mg at 01/03/24 1478   loperamide (IMODIUM) capsule 2-4 mg  2-4 mg Oral PRN Remington, Amber E, NP       magnesium hydroxide (MILK OF MAGNESIA) suspension 30 mL  30 mL Oral Daily PRN Penn, Cranston Neighbor, NP        melatonin tablet 5 mg  5 mg Oral QHS Myriam Forehand, NP   5 mg at 01/04/24 2206   methocarbamol (ROBAXIN) tablet 500 mg  500 mg Oral Q8H PRN Darrold Junker E, NP   500 mg at 01/03/24 1619   mometasone-formoterol (DULERA) 100-5 MCG/ACT inhaler 2 puff  2 puff Inhalation BID Emeline General, MD   2 puff at 01/03/24 0910   multivitamin with minerals tablet 1 tablet  1 tablet Oral Daily Myriam Forehand, NP   1 tablet at 01/04/24 2956   naproxen (NAPROSYN) tablet 500 mg  500 mg Oral BID PRN Darrold Junker E, NP   500 mg at 01/03/24 1619   nicotine (NICODERM CQ - dosed in mg/24 hours) patch 14 mg  14 mg Transdermal Daily Myriam Forehand, NP   14 mg at 01/04/24 0940   ondansetron (ZOFRAN-ODT) disintegrating tablet 4 mg  4 mg Oral Q6H PRN Lovette Cliche, NP  rOPINIRole (REQUIP) tablet 1 mg  1 mg Oral QHS Remington, Amber E, NP   1 mg at 01/04/24 2206   thiamine (VITAMIN B1) tablet 100 mg  100 mg Oral Daily Myriam Forehand, NP   100 mg at 01/04/24 6295   Or   thiamine (VITAMIN B1) injection 100 mg  100 mg Intravenous Daily Myriam Forehand, NP       traZODone (DESYREL) tablet 100 mg  100 mg Oral QHS Remington, Amber E, NP   100 mg at 01/04/24 2206   No Known Allergies Principal Problem:   Major depressive disorder, recurrent episode, moderate (HCC) Active Problems:   Benzodiazepine abuse (HCC)   Suicidal ideations   Buprenorphine and naloxone withdrawal (HCC)  Blood pressure 111/89, pulse 85, temperature 98.3 F (36.8 C), temperature source Oral, resp. rate 16, height 5\' 7"  (1.702 m), weight 51.3 kg, SpO2 97%.  Subjective Objective Assessment & Plan  Corey House Corey House 01/05/2024

## 2024-01-06 DIAGNOSIS — F331 Major depressive disorder, recurrent, moderate: Secondary | ICD-10-CM | POA: Diagnosis not present

## 2024-01-06 NOTE — Plan of Care (Signed)
  Problem: Education: Goal: Mental status will improve Outcome: Progressing   

## 2024-01-06 NOTE — [facility] Counselor (Signed)
[name] met with the patient. Patient reports that he "needs to leave".  [name] explained that she can check with the local shelter for a bed and can look into [facility] for the patient.   Patient stated that he can not go to a shelter.   Patient reports that he would like a rehab facility.  [name] pointed out patient has been provided the list of providers.  Patient reports that he would like to go to [facility].  He reports that they are closed today 01/06/2024 and will call the next day.  He reports that he has not entertained the possibility of any other providers.  [name] recommended that pt review other options.   Penni Homans, MSW, LCSW 01/06/2024 1:59 PM

## 2024-01-06 NOTE — Plan of Care (Signed)
  Problem: Education: Goal: Knowledge of [facility] Education information/materials will improve Outcome: Progressing Goal: Emotional status will improve Outcome: Progressing   

## 2024-01-06 NOTE — Progress Notes (Cosign Needed Addendum)
[facility] MD Progress Note  01/06/2024 6:40 PM Corey House.  MRN:  [id number] Principal Problem: Major depressive disorder, recurrent episode, moderate (HCC) Diagnosis: Principal Problem:   Major depressive disorder, recurrent episode, moderate (HCC) Active Problems:   Benzodiazepine abuse (HCC)   Suicidal ideations   Buprenorphine and naloxone withdrawal (HCC)  Reason for admission: 58 year old Caucasian male, resents with suicidal ideation (SI) and a plan to hang himself, reporting worsening symptoms of depression over the past month. He describes feeling increasingly hopeless and helpless, with escalation of symptoms in the past few days.uicidal ideation: Persistent for the past month, worsening over the last few days.   Today's assessment notes: On assessment today, the pt reports that his mood is euthymic, improved since admission, and stable.  Report he has history of depression for the past 30 years since his wife had a baby boy's best friend 30 years ago.  However reports that anxiety symptoms are at manageable level.  Reports he is planning to go to [place] for 30 days for drug rehabilitation treatment. Sleep is stable. Appetite is stable.  Concentration is without complaint.  Energy level is adequate. Denies having any suicidal thoughts. Denies having any suicidal intent and plan.  Denies having any HI.  Denies having psychotic symptoms.   Denies having side effects to current psychiatric medications.   Total Time spent with patient: 45 minutes  Past Psychiatric History: MDD per patient   Past Medical History:  Past Medical History:  Diagnosis Date   Chronic cough    Urolithiasis    Urolithiasis     Past Surgical History:  Procedure Laterality Date   CATARACT EXTRACTION W/PHACO Left 08/09/2021   Procedure: CATARACT EXTRACTION PHACO AND INTRAOCULAR LENS PLACEMENT (IOC) LEFT .051 00:10.0;  Surgeon: Lockie Mola, MD;  Location: [facility];  Service:  Ophthalmology;  Laterality: Left;   CATARACT EXTRACTION W/PHACO Right 10/04/2021   Procedure: CATARACT EXTRACTION PHACO AND INTRAOCULAR LENS PLACEMENT (IOC) RIGHT 0.67 00:13.8;  Surgeon: Lockie Mola, MD;  Location: [facility];  Service: Ophthalmology;  Laterality: Right;  general anesthesia needs to be last case   HERNIA REPAIR     x2   MANDIBLE FRACTURE SURGERY     Family History: History reviewed. No pertinent family history. Family Psychiatric  History: See H&P Social History:  Social History   Substance and Sexual Activity  Alcohol Use No     Social History   Substance and Sexual Activity  Drug Use Not on file    Social History   Socioeconomic History   Marital status: Single    Spouse name: Not on file   Number of children: Not on file   Years of education: Not on file   Highest education level: Not on file  Occupational History   Not on file  Tobacco Use   Smoking status: Every Day    Current packs/day: 1.00    Average packs/day: 1 pack/day for 35.0 years (35.0 ttl pk-yrs)    Types: Cigarettes   Smokeless tobacco: Never   Tobacco comments:    Started smoking age 55  Vaping Use   Vaping status: Never Used  Substance and Sexual Activity   Alcohol use: No   Drug use: Not on file   Sexual activity: Not on file  Other Topics Concern   Not on file  Social History Narrative   Not on file   Social Drivers of Health   Financial Resource Strain: Not on file  Food Insecurity: Patient  Declined (12/27/2023)   Hunger Vital Sign    Worried About Running Out of Food in the Last Year: Patient declined    Ran Out of Food in the Last Year: Patient declined  Recent Concern: Food Insecurity - Food Insecurity Present (12/27/2023)   Hunger Vital Sign    Worried About Running Out of Food in the Last Year: Patient declined    Ran Out of Food in the Last Year: Sometimes true  Transportation Needs: Patient Declined (12/27/2023)   PRAPARE - Therapist, art (Medical): Patient declined    Lack of Transportation (Non-Medical): Patient declined  Physical Activity: Not on file  Stress: Not on file  Social Connections: Not on file   Additional Social History:   Sleep: Good  Appetite:  Good  Current Medications: Current Facility-Administered Medications  Medication Dose Route Frequency Provider Last Rate Last Admin   acetaminophen (TYLENOL) tablet 650 mg  650 mg Oral Q6H PRN Penn, Cicely, NP   650 mg at 01/06/24 1711   albuterol (VENTOLIN HFA) 108 (90 Base) MCG/ACT inhaler 2 puff  2 puff Inhalation Q6H Mikey College T, MD   2 puff at 01/06/24 1435   alum & mag hydroxide-simeth (MAALOX/MYLANTA) 200-200-20 MG/5ML suspension 30 mL  30 mL Oral Q4H PRN Penn, Cranston Neighbor, NP       aspirin EC tablet 81 mg  81 mg Oral Daily Mikey College T, MD   81 mg at 01/06/24 1610   benzonatate (TESSALON) capsule 100 mg  100 mg Oral TID PRN Mikey College T, MD   100 mg at 01/03/24 0911   cloNIDine (CATAPRES) tablet 0.1 mg  0.1 mg Oral BID Remington, Amber E, NP   0.1 mg at 01/06/24 1712   haloperidol (HALDOL) tablet 5 mg  5 mg Oral TID PRN Mcneil Sober, NP   5 mg at 01/03/24 0631   And   diphenhydrAMINE (BENADRYL) capsule 50 mg  50 mg Oral TID PRN Mcneil Sober, NP   50 mg at 01/03/24 0631   escitalopram (LEXAPRO) tablet 20 mg  20 mg Oral Daily Myriam Forehand, NP   20 mg at 01/06/24 0859   feeding supplement (ENSURE ENLIVE / ENSURE PLUS) liquid 237 mL  237 mL Oral BID BM Myriam Forehand, NP   237 mL at 01/03/24 1400   folic acid (FOLVITE) tablet 1 mg  1 mg Oral Daily Myriam Forehand, NP   1 mg at 01/06/24 0859   guaiFENesin (ROBITUSSIN) 100 MG/5ML liquid 5 mL  5 mL Oral Q6H PRN Mikey College T, MD   5 mL at 12/31/23 1835   hydrOXYzine (ATARAX) tablet 50 mg  50 mg Oral TID PRN Darrold Junker E, NP   50 mg at 01/03/24 0909   magnesium hydroxide (MILK OF MAGNESIA) suspension 30 mL  30 mL Oral Daily PRN Penn, Cranston Neighbor, NP       melatonin tablet 5 mg  5 mg Oral QHS  Myriam Forehand, NP   5 mg at 01/05/24 2138   mometasone-formoterol (DULERA) 100-5 MCG/ACT inhaler 2 puff  2 puff Inhalation BID Emeline General, MD   2 puff at 01/03/24 0910   multivitamin with minerals tablet 1 tablet  1 tablet Oral Daily Myriam Forehand, NP   1 tablet at 01/06/24 0859   nicotine (NICODERM CQ - dosed in mg/24 hours) patch 14 mg  14 mg Transdermal Daily Myriam Forehand, NP   14 mg at 01/06/24 0900  QUEtiapine (SEROQUEL) tablet 50 mg  50 mg Oral QHS PRN Melanie Crazier, Amber E, NP   50 mg at 01/05/24 2207   rOPINIRole (REQUIP) tablet 1 mg  1 mg Oral QHS Remington, Amber E, NP   1 mg at 01/05/24 2139   thiamine (VITAMIN B1) tablet 100 mg  100 mg Oral Daily Myriam Forehand, NP   100 mg at 01/06/24 1610   Or   thiamine (VITAMIN B1) injection 100 mg  100 mg Intravenous Daily Myriam Forehand, NP       traZODone (DESYREL) tablet 100 mg  100 mg Oral QHS Remington, Amber E, NP   100 mg at 01/05/24 2138    Lab Results: No results found for this or any previous visit (from the past 48 hours).  Blood Alcohol level:  Lab Results  Component Value Date   ETH <10 12/27/2023    Metabolic Disorder Labs: Lab Results  Component Value Date   HGBA1C 6.1 (H) 12/29/2023   MPG 128.37 12/29/2023   No results found for: "PROLACTIN" Lab Results  Component Value Date   CHOL 155 12/29/2023   TRIG 114 12/29/2023   HDL 31 (L) 12/29/2023   CHOLHDL 5.0 12/29/2023   VLDL 23 12/29/2023   LDLCALC 101 (H) 12/29/2023   Physical Findings: AIMS:  , ,  ,  ,    CIWA:  CIWA-Ar Total: 16 COWS:  COWS Total Score: 3  Musculoskeletal: Strength & Muscle Tone: within normal limits Gait & Station: normal Patient leans: N/A  Psychiatric Specialty Exam:  Presentation  General Appearance:  Appropriate for Environment; Fairly Groomed; Casual  Eye Contact: Good  Speech: Clear and Coherent  Speech Volume: Normal  Handedness: Right  Mood and Affect  Mood: Anxious; Depressed  Affect: Appropriate;  Congruent  Thought Process  Thought Processes: Coherent  Descriptions of Associations:Intact  Orientation:Full (Time, Place and Person)  Thought Content:Logical  History of Schizophrenia/Schizoaffective disorder:No  Duration of Psychotic Symptoms:No data recorded Hallucinations:Hallucinations: None  Ideas of Reference:None  Suicidal Thoughts:Suicidal Thoughts: No SI Passive Intent and/or Plan: -- (Denies)  Homicidal Thoughts:Homicidal Thoughts: No  Sensorium  Memory: Immediate Good; Recent Good  Judgment: Fair  Insight: Fair  Art therapist  Concentration: Good  Attention Span: Good  Recall: Fair  Fund of Knowledge: Fair  Language: Good  Psychomotor Activity  Psychomotor Activity: Psychomotor Activity: Normal  Assets  Assets: Communication Skills; Desire for Improvement; Housing  Sleep  Sleep: Sleep: Good Number of Hours of Sleep: 6  Physical Exam: Physical Exam Vitals and nursing note reviewed.  Constitutional:      Appearance: Normal appearance.  HENT:     Head: Normocephalic.     Nose: Nose normal.     Mouth/Throat:     Mouth: Mucous membranes are moist.     Pharynx: Oropharynx is clear.  Eyes:     Extraocular Movements: Extraocular movements intact.  Cardiovascular:     Rate and Rhythm: Normal rate.     Pulses: Normal pulses.  Pulmonary:     Effort: Pulmonary effort is normal.  Abdominal:     Comments: Deferred  Genitourinary:    Comments: Deferred Musculoskeletal:        General: Normal range of motion.     Cervical back: Normal range of motion.  Skin:    General: Skin is warm.  Neurological:     General: No focal deficit present.     Mental Status: He is alert and oriented to person, place, and time.  Psychiatric:  Mood and Affect: Mood normal.        Behavior: Behavior normal.    Review of Systems  Constitutional:  Negative for chills and fever.  HENT:  Negative for sore throat.   Eyes:  Negative  for blurred vision.  Respiratory:  Negative for cough, sputum production, shortness of breath and wheezing.   Cardiovascular:  Negative for chest pain and palpitations.  Gastrointestinal:  Negative for abdominal pain, constipation, diarrhea, heartburn, nausea and vomiting.  Genitourinary: Negative.   Musculoskeletal:  Negative for falls.  Skin:  Negative for itching and rash.  Neurological:  Negative for dizziness, tingling, tremors and headaches.  Endo/Heme/Allergies:        See allergy listing  Psychiatric/Behavioral:  Positive for depression. The patient is nervous/anxious.    Blood pressure 121/86, pulse 82, temperature 98.8 F (37.1 C), resp. rate 16, height 5\' 7"  (1.702 m), weight 51.3 kg, SpO2 100%. Body mass index is 17.7 kg/m.  Treatment Plan Summary: Daily contact with patient to assess and evaluate symptoms and progress in treatment and Medication management -- Continue Lexapro  20 mg daily as tolerated. --Continue melatonin: 3 mg nightly to assist with sleep. --Continue trazodone 50 mg p.o. nightly as needed for insomnia --Continue hydroxyzine 25 mg tablet p.o. 3 times daily as needed for anxiety --Ativan (Lorazepam): Administer per CIWA protocol for benzodiazepine misuse. Suboxone: Not ordered due to inconsistent use and lack of recent prescription (18 months). Chest X-ray: Evaluate nonproductive cough to rule out infections or exacerbation of COPD. Hospitalist Consultation: Address physical health concerns, including cough and potential respiratory issues. Educate the patient on the risks of obtaining Suboxone and Xanax from nonmedical sources.  Medication for other medical conditions: See [facility]  Agitation protocol: See [facility]   Physician Treatment Plan for Primary Diagnosis: Major depressive disorder, recurrent episode, moderate (HCC) Long Term Goal(s): Improvement in symptoms so as ready for discharge   Short Term Goals: Ability to identify changes in lifestyle to reduce  recurrence of condition will improve, Ability to verbalize feelings will improve, Ability to disclose and discuss suicidal ideas, Ability to demonstrate self-control will improve, Ability to identify and develop effective coping behaviors will improve, Ability to maintain clinical measurements within normal limits will improve, Compliance with prescribed medications will improve, and Ability to identify triggers associated with substance abuse/mental health issues will improve   Physician Treatment Plan for Secondary Diagnosis: Principal Problem:   Major depressive disorder, recurrent episode, moderate (HCC) Active Problems:   Benzodiazepine abuse (HCC)   Suicidal ideations   Long Term Goal(s): Improvement in symptoms so as ready for discharge   Short Term Goals: Ability to identify changes in lifestyle to reduce recurrence of condition will improve, Ability to verbalize feelings will improve, Ability to disclose and discuss suicidal ideas, Ability to demonstrate self-control will improve, Ability to identify and develop effective coping behaviors will improve, Ability to maintain clinical measurements within normal limits will improve, Compliance with prescribed medications will improve, and Ability to identify triggers associated with substance abuse/mental health issues will improve   I certify that inpatient services furnished can reasonably be expected to improve the patient's condition.     Cecilie Lowers, FNP 01/06/2024, 6:40 PM

## 2024-01-06 NOTE — Group Note (Signed)
Date:  01/06/2024 Time:  8:52 PM  Group Topic/Focus:  Wellness Toolbox:   The focus of this group is to discuss various aspects of wellness, balancing those aspects and exploring ways to increase the ability to experience wellness.  Patients will create a wellness toolbox for use upon discharge. Wrap-Up Group:   The focus of this group is to help patients review their daily goal of treatment and discuss progress on daily workbooks.    Participation Level:  Active  Participation Quality:  Appropriate and Attentive  Affect:  Appropriate  Cognitive:  Alert and Appropriate  Insight: Appropriate  Engagement in Group:  Improving  Modes of Intervention:  Discussion  Additional Comments:     Maglione,Naseer Hearn E 01/06/2024, 8:52 PM

## 2024-01-06 NOTE — Group Note (Signed)
Date:  01/06/2024 Time:  12:46 PM  Group Topic/Focus:  Rediscovering Joy:   The focus of this group is to explore various ways to relieve stress in a positive manner. Art group therapy    Participation Level:  Minimal  Participation Quality:  Attentive  Affect:  Appropriate  Cognitive:  Alert  Insight: Appropriate  Engagement in Group:  Developing/Improving  Modes of Intervention:  Activity  Additional Comments:    Karrine Kluttz 01/06/2024, 12:46 PM

## 2024-01-06 NOTE — Group Note (Signed)
Date:  01/06/2024 Time:  10:22 AM  Group Topic/Focus:  Goals Group:   The focus of this group is to help patients establish daily goals to achieve during treatment and discuss how the patient can incorporate goal setting into their daily lives to aide in recovery. Orientation:   The focus of this group is to educate the patient on the purpose and policies of crisis stabilization and provide a format to answer questions about their admission.  The group details unit policies and expectations of patients while admitted. Pop QUIZ: Laurey Morale    Participation Level:  Active  Participation Quality:  Appropriate  Affect:  Appropriate  Cognitive:  Alert, Appropriate, and Oriented  Insight: Appropriate  Engagement in Group:  Developing/Improving and Engaged  Modes of Intervention:  Activity, Discussion, Education, and Socialization  Additional Comments:    Rosaura Carpenter 01/06/2024, 10:22 AM

## 2024-01-06 NOTE — Group Note (Addendum)
Recreation Therapy Group Note   Group Topic:Health and Wellness  Group Date: 01/06/2024 Start Time: 1000 End Time: 1050 Facilitators: Rosina Lowenstein, LRT, CTRS Location:  Craft Room  Activity Description/Intervention: Therapeutic Drumming. Patients with peers and staff were given the opportunity to engage in a leader facilitated HealthRHYTHMS Group Empowerment Drumming Circle with staff from the [employer], in partnership with [employer]. Teaching laboratory technician and trained [employer], Theodoro Doing leading with LRT observing and documenting intervention and pt response. This evidenced-based practice targets 7 areas of health and wellbeing in the human experience including: stress-reduction, exercise, self-expression, camaraderie/support, nurturing, spirituality, and music-making (leisure).   Goal Area(s) Addresses:  Patient will engage in pro-social way in music group.  Patient will follow directions of drum leader on the first prompt. Patient will demonstrate no behavioral issues during group.  Patient will identify if a reduction in stress level occurs as a result of participation in therapeutic drum circle.    Education: Leisure exposure, Pharmacologist, Musical expression, Discharge Planning   Clinical Observations/Individualized Feedback: Bernette Redbird actively engaged in therapeutic drumming exercise and discussions. Pt was appropriate with peers, staff, and musical equipment for duration of programming.  Pt identified "good" as their feeling after participation in music-based programming. Pt affect congruent with verbalized emotion.    Plan: Continue to engage patient in RT group sessions 2-3x/week.   [address], LRT, CTRS 01/06/2024 1:56 PM

## 2024-01-06 NOTE — Progress Notes (Signed)
   01/06/24 0600  Psych Admission Type (Psych Patients Only)  Admission Status Voluntary  Psychosocial Assessment  Patient Complaints Anxiety  Eye Contact Fair  Facial Expression Anxious  Affect Anxious  Speech Logical/coherent  Interaction Assertive  Motor Activity Slow  Appearance/Hygiene In scrubs  Behavior Characteristics Cooperative;Appropriate to situation  Mood Anxious  Aggressive Behavior  Effect No apparent injury  Thought Process  Coherency WDL  Content WDL  Delusions None reported or observed  Perception WDL  Hallucination None reported or observed  Judgment WDL  Confusion None  Danger to Self  Current suicidal ideation?  ([place])  Agreement Not to Harm Self Yes  Danger to Others  Danger to Others None reported or observed

## 2024-01-06 NOTE — Group Note (Signed)
LCSW Group Therapy Note   Group Date: 01/06/2024 Start Time: 1400 End Time: 1505   Type of Therapy and Topic:  Group Therapy: Challenging Core Beliefs  Participation Level:  Did Not Attend  Description of Group:  Patients were educated about core beliefs and asked to identify one harmful core belief that they have. Patients were asked to explore from where those beliefs originate. Patients were asked to discuss how those beliefs make them feel and the resulting behaviors of those beliefs. They were then be asked if those beliefs are true and, if so, what evidence they have to support them. Lastly, group members were challenged to replace those negative core beliefs with helpful beliefs.   Therapeutic Goals:   1. Patient will identify harmful core beliefs and explore the origins of such beliefs. 2. Patient will identify feelings and behaviors that result from those core beliefs. 3. Patient will discuss whether such beliefs are true. 4.  Patient will replace harmful core beliefs with helpful ones.  Summary of Patient Progress:  Patient did not attend group.   Therapeutic Modalities: Cognitive Behavioral Therapy; Solution-Focused Therapy   Lowry Ram, LCSWA 01/06/2024  3:12 PM

## 2024-01-07 DIAGNOSIS — F331 Major depressive disorder, recurrent, moderate: Secondary | ICD-10-CM | POA: Diagnosis not present

## 2024-01-07 MED ORDER — QUETIAPINE FUMARATE 50 MG PO TABS: 50.0000 mg | ORAL_TABLET | Freq: Every day | ORAL | 0 refills | Status: DC

## 2024-01-07 MED ORDER — MOMETASONE FURO-FORMOTEROL FUM 100-5 MCG/ACT IN AERO: 2.0000 | INHALATION_SPRAY | Freq: Two times a day (BID) | RESPIRATORY_TRACT | 0 refills | Status: DC

## 2024-01-07 MED ORDER — ALBUTEROL SULFATE HFA 108 (90 BASE) MCG/ACT IN AERS: 2.0000 | INHALATION_SPRAY | Freq: Four times a day (QID) | RESPIRATORY_TRACT | 0 refills | Status: DC

## 2024-01-07 MED ORDER — VITAMIN B-1 100 MG PO TABS: 100.0000 mg | ORAL_TABLET | Freq: Every day | ORAL | 0 refills | Status: AC

## 2024-01-07 MED ORDER — CLONIDINE HCL 0.1 MG PO TABS: 0.1000 mg | ORAL_TABLET | Freq: Two times a day (BID) | ORAL | 11 refills | Status: DC

## 2024-01-07 MED ORDER — MELATONIN 5 MG PO TABS: 5.0000 mg | ORAL_TABLET | Freq: Every day | ORAL | 0 refills | Status: AC

## 2024-01-07 MED ORDER — HYDROXYZINE HCL 50 MG PO TABS: 50.0000 mg | ORAL_TABLET | Freq: Three times a day (TID) | ORAL | 0 refills | Status: AC | PRN

## 2024-01-07 MED ORDER — ASPIRIN 81 MG PO TBEC: 81.0000 mg | DELAYED_RELEASE_TABLET | Freq: Every day | ORAL | 0 refills | Status: AC

## 2024-01-07 MED ORDER — ESCITALOPRAM OXALATE 20 MG PO TABS: 20.0000 mg | ORAL_TABLET | Freq: Every day | ORAL | 0 refills | Status: DC

## 2024-01-07 MED ORDER — TRAZODONE HCL 100 MG PO TABS: 100.0000 mg | ORAL_TABLET | Freq: Every day | ORAL | 0 refills | Status: DC

## 2024-01-07 MED ORDER — FOLIC ACID 1 MG PO TABS: 1.0000 mg | ORAL_TABLET | Freq: Every day | ORAL | 0 refills | Status: AC

## 2024-01-07 NOTE — Group Note (Signed)
LCSW Group Therapy Note  Group Date: 01/07/2024 Start Time: 1315 End Time: 1415   Type of Therapy and Topic:  Group Therapy: Cycles of Anxiety    Participation Level:  Did Not Attend     Description of Group:   In this group, patients learned how to recognize the physical, cognitive, emotional, and behavioral responses they have to anxiety-provoking situations.  They identified a recent time they became anxious and how they reacted.  Reviewed the cycle of anxiety.  They analyzed how their reaction was possibly beneficial and how it was possibly unhelpful.  The group discussed a variety of healthier coping skills that could help with such a situation in the future.  Focus was placed on how helpful it is to recognize the underlying emotions to our anxiety, because working on those can lead to a more permanent solution as well as our ability to focus on the important rather than the urgent.   Therapeutic Goals: Patients will remember their last incident of anxiety and how they felt emotionally and physically, what their thoughts were at the time, and how they behaved. Patients will identify how their behavior at that time worked for them, as well as how it worked against them. Patients will explore possible new behaviors to use in future anxious situations. Patients will learn that anxiety itself is normal and cannot be eliminated, and that healthier reactions can assist with resolving conflict rather than worsening situations.situations.  Summary of Patient Progress:  Patient was present in group.  Patient was supportive of others.  Patient was self-depreciating in group and this writer had to redirect patient several times. Patient was encouraged to think of silver linings.  Patient was encouraged to think of coping mechanisms.  He demonstrated poor insight into the subject matter, was respectful of peers, and participated throughout the entire session.  Therapeutic Modalities:   Cognitive  Behavioral Therapy    Harden Mo, LCSW 01/07/2024  3:27 PM

## 2024-01-07 NOTE — Discharge Summary (Signed)
Physician Discharge Summary Note  Patient:  Corey House. is an 58 y.o., male MRN:  [id number] DOB:  04/03/1966 Patient phone:  [phone] (home)  Patient address:   [address] [place] [address],  Total Time spent with patient: 1.5 hours  Date of Admission:  12/27/2023 Date of Discharge: 01/07/2024  Reason for Admission:   58 year old Caucasian male admitted for psychiatric stabilization due to suicidal ideation (SI) with a specific plan to hang himself. He reports worsening depression, severe insomnia, social isolation, and financial instability after losing his job one month ago. The client has a history of Major Depressive Disorder (MDD) and Opioid Use Disorder (OUD). He acknowledges intermittent Suboxone use obtained from friends, last used one day prior to admission, and admits to using Xanax provided by his son.The client denies homicidal ideation (HI), auditory or visual hallucinations (AVH), delusions, current alcohol use, or regular opioid use. He reports a persistent nonproductive cough, shortness of breath with exertion, and occasional wheezing, with no fever, chills, or chest pain. He previously responded positively to Lexapro but stopped taking it four months ago.The client presents with severe depression, active suicidal ideation, and exacerbation of chronic conditions. He has a history of medication nonadherence and intermittent misuse of benzodiazepines and Suboxone. His physical symptoms, including dyspnea and cough, may be related to underlying respiratory conditions or recent use of Xanax.58 year old Caucasian male admitted for psychiatric stabilization due to suicidal ideation (SI) with a specific plan to hang himself. He reported worsening depressive symptoms over the past month, exacerbated by job loss, financial instability, and social isolation. The client described pervasive feelings of hopelessness, helplessness, and severe insomnia, stating, "I can't sleep at  all." He has been spending most of his time in his room, avoiding social interactions, and experiencing overwhelming stress about leaving his home.The client has a history of Major Depressive Disorder (MDD) and Opioid Use Disorder (OUD) and acknowledged intermittent Suboxone use obtained from friends, with the last use one day prior to admission. He also admitted to taking Xanax provided by his son but denied regular opioid or alcohol use.Given the active suicidal ideation, history of medication nonadherence, and worsening mental health symptoms, the client required immediate hospitalization for safety, stabilization, and treatment initiation.  Principal Problem: Major depressive disorder, recurrent episode, moderate (HCC) Discharge Diagnoses: Principal Problem:   Major depressive disorder, recurrent episode, moderate (HCC) Active Problems:   Benzodiazepine abuse (HCC)   Buprenorphine and naloxone withdrawal (HCC)   Past Psychiatric History: MDD   Past Medical History:  Past Medical History:  Diagnosis Date   Chronic cough    Urolithiasis    Urolithiasis     Past Surgical History:  Procedure Laterality Date   CATARACT EXTRACTION W/PHACO Left 08/09/2021   Procedure: CATARACT EXTRACTION PHACO AND INTRAOCULAR LENS PLACEMENT (IOC) LEFT .051 00:10.0;  Surgeon: Lockie Mola, MD;  Location: [facility];  Service: Ophthalmology;  Laterality: Left;   CATARACT EXTRACTION W/PHACO Right 10/04/2021   Procedure: CATARACT EXTRACTION PHACO AND INTRAOCULAR LENS PLACEMENT (IOC) RIGHT 0.67 00:13.8;  Surgeon: Lockie Mola, MD;  Location: [facility];  Service: Ophthalmology;  Laterality: Right;  general anesthesia needs to be last case   HERNIA REPAIR     x2   MANDIBLE FRACTURE SURGERY     Family History: History reviewed. No pertinent family history. Family Psychiatric  History: none reported Social History:  Social History   Substance and Sexual Activity  Alcohol Use  No     Social History  Substance and Sexual Activity  Drug Use Not on file    Social History   Socioeconomic History   Marital status: Single    Spouse name: Not on file   Number of children: Not on file   Years of education: Not on file   Highest education level: Not on file  Occupational History   Not on file  Tobacco Use   Smoking status: Every Day    Current packs/day: 1.00    Average packs/day: 1 pack/day for 35.0 years (35.0 ttl pk-yrs)    Types: Cigarettes   Smokeless tobacco: Never   Tobacco comments:    Started smoking age 83  Vaping Use   Vaping status: Never Used  Substance and Sexual Activity   Alcohol use: No   Drug use: Not on file   Sexual activity: Not on file  Other Topics Concern   Not on file  Social History Narrative   Not on file   Social Drivers of Health   Financial Resource Strain: Not on file  Food Insecurity: Patient Declined (12/27/2023)   Hunger Vital Sign    Worried About Running Out of Food in the Last Year: Patient declined    Ran Out of Food in the Last Year: Patient declined  Recent Concern: Food Insecurity - Food Insecurity Present (12/27/2023)   Hunger Vital Sign    Worried About Running Out of Food in the Last Year: Patient declined    Ran Out of Food in the Last Year: Sometimes true  Transportation Needs: Patient Declined (12/27/2023)   PRAPARE - Administrator, Civil Service (Medical): Patient declined    Lack of Transportation (Non-Medical): Patient declined  Physical Activity: Not on file  Stress: Not on file  Social Connections: Not on file    Hospital Course:  The patient with a history of Major Depressive Disorder (MDD) and Opioid Use Disorder (OUD), was admitted for psychiatric stabilization following the presentation of suicidal ideation (SI) with a specific plan to hang himself. His depression and anxiety were exacerbated by recent job loss, financial instability, and social isolation.The client reported  worsening depression and feelings of hopelessness over the past month, with escalating symptoms in the days leading to admission.He described severe insomnia, fatigue, and avoidance of social interactions.The client acknowledged intermittent Suboxone use (last used one day prior to admission) and recent use of Xanax provided by his son. He denied current alcohol or regular opioid use.He presented with a persistent nonproductive cough, occasional wheezing, and shortness of breath on exertion. The patient was closely observation for SI and side effects from medication initiation. The client experienced brief double vision on the second day of Lexapro, which resolved spontaneously without other neurological symptoms. The patient was educated was provided on the risks of obtaining Suboxone and Xanax from nonmedical sources.The patient was [placed on Albuterol (LABA) plus ICS inhaler started to address shortness of breath and wheezing.A 5-day course of steroids was initiated to reduce inflammation.Guaifenesin and Tessalon Perles prescribed for cough relief. Incentive spirometry recommended to improve lung function and reduce dyspnea. Chest X-ray was ordered to rule out infections or exacerbation of COPD, with no acute findings.A hospitalist was consulted to address respiratory concerns and ensure coordination of care for the client's medical conditions, including history of kidney stones.The client participated in individual therapy sessions focusing on stress management and coping strategies.He expressed motivation to improve his mental health and actively engaged in discussions about managing his stressors, including financial instability and social isolation.The  client was discharged in a stable condition, denying SI, homicidal ideation (HI), or other acute psychiatric symptoms. He expressed an understanding of his treatment plan and acknowledged the importance of medication adherence and follow-up care.The client  expressed motivation to adhere to the treatment plan and address his mental health and substance use challenges. He left the hospital with a clear path for follow-up care and stabilization.            Musculoskeletal: Strength & Muscle Tone: within normal limits Gait & Station: normal Patient leans: N/A   Psychiatric Specialty Exam:  Presentation  General Appearance:  Fairly Groomed  Eye Contact: Minimal  Speech: Clear and Coherent; Normal Rate  Speech Volume: Normal  Handedness: Right   Mood and Affect  Mood: Euthymic  Affect: Flat   Thought Process  Thought Processes: Coherent  Descriptions of Associations:Intact  Orientation:Full (Time, Place and Person) (and situation)  Thought Content:WDL  History of Schizophrenia/Schizoaffective disorder:No  Duration of Psychotic Symptoms:No data recorded Hallucinations:Hallucinations: None  Ideas of Reference:None  Suicidal Thoughts:Suicidal Thoughts: No SI Passive Intent and/or Plan: -- (denies)  Homicidal Thoughts:Homicidal Thoughts: No   Sensorium  Memory: Immediate Good; Remote Good  Judgment: Fair  Insight: Fair   Art therapist  Concentration: Fair  Attention Span: Fair  Recall: Good  Fund of Knowledge: Good  Language: Good   Psychomotor Activity  Psychomotor Activity: Psychomotor Activity: Normal   Assets  Assets: Communication Skills   Sleep  Sleep: Sleep: Good Number of Hours of Sleep: 7    Physical Exam: Physical Exam Vitals and nursing note reviewed.  Constitutional:      Appearance: Normal appearance.  HENT:     Head: Normocephalic and atraumatic.     Nose: Nose normal.  Pulmonary:     Effort: Pulmonary effort is normal.     Comments: Non productive cough Musculoskeletal:        General: Normal range of motion.     Cervical back: Normal range of motion.  Neurological:     General: No focal deficit present.     Mental Status: He is  alert and oriented to person, place, and time. Mental status is at baseline.  Psychiatric:        Attention and Perception: Attention and perception normal.        Mood and Affect: Affect is flat.        Speech: Speech normal.        Behavior: Behavior normal. Behavior is cooperative.        Thought Content: Thought content normal.        Cognition and Memory: Cognition and memory normal.        Judgment: Judgment normal.    Review of Systems  Respiratory:  Positive for cough.   All other systems reviewed and are negative.  Blood pressure 105/74, pulse 79, temperature 98.2 F (36.8 C), temperature source Oral, resp. rate 16, height 5\' 7"  (1.702 m), weight 51.3 kg, SpO2 94%. Body mass index is 17.7 kg/m.   Social History   Tobacco Use  Smoking Status Every Day   Current packs/day: 1.00   Average packs/day: 1 pack/day for 35.0 years (35.0 ttl pk-yrs)   Types: Cigarettes  Smokeless Tobacco Never  Tobacco Comments   Started smoking age 51   Tobacco Cessation:  A prescription for an FDA-approved tobacco cessation medication provided at discharge   Blood Alcohol level:  Lab Results  Component Value Date   [facility] <10 12/27/2023    Metabolic  Disorder Labs:  Lab Results  Component Value Date   HGBA1C 6.1 (H) 12/29/2023   MPG 128.37 12/29/2023   No results found for: "PROLACTIN" Lab Results  Component Value Date   CHOL 155 12/29/2023   TRIG 114 12/29/2023   HDL 31 (L) 12/29/2023   CHOLHDL 5.0 12/29/2023   VLDL 23 12/29/2023   LDLCALC 101 (H) 12/29/2023    See Psychiatric Specialty Exam and Suicide Risk Assessment completed by Attending Physician prior to discharge.  Discharge destination:  Home  Is patient on multiple antipsychotic therapies at discharge:  No   Has Patient had three or more failed trials of antipsychotic monotherapy by history:  No  Recommended Plan for Multiple Antipsychotic Therapies: NA   Allergies as of 01/07/2024   No Known Allergies       Medication List     STOP taking these medications    feeding supplement Liqd   multivitamin with minerals Tabs tablet   nicotine 14 mg/24hr patch Commonly known as: NICODERM CQ - dosed in mg/24 hours       TAKE these medications      Indication  albuterol 108 (90 Base) MCG/ACT inhaler Commonly known as: VENTOLIN HFA Inhale 2 puffs into the lungs every 6 (six) hours as needed for wheezing or shortness of breath.  Indication: Worsening of Chronic Obstructive Lung Disease   albuterol 108 (90 Base) MCG/ACT inhaler Commonly known as: VENTOLIN HFA Inhale 2 puffs into the lungs every 6 (six) hours.  Indication: Chronic Obstructive Lung Disease   aspirin EC 81 MG tablet Take 1 tablet (81 mg total) by mouth daily. Swallow whole. Start taking on: January 08, 2024  Indication: Stable Angina Pectoris   cloNIDine 0.1 MG tablet Commonly known as: CATAPRES Take 1 tablet (0.1 mg total) by mouth 2 (two) times daily.  Indication: Opiate Withdrawal   escitalopram 20 MG tablet Commonly known as: LEXAPRO Take 1 tablet (20 mg total) by mouth daily for 3 days. Start taking on: January 08, 2024 What changed:  medication strength how much to take  Indication: Major Depressive Disorder, Social Anxiety Disorder   folic acid 1 MG tablet Commonly known as: FOLVITE Take 1 tablet (1 mg total) by mouth daily. Start taking on: January 08, 2024  Indication: Anemia From Inadequate Folic Acid   hydrOXYzine 50 MG tablet Commonly known as: ATARAX Take 1 tablet (50 mg total) by mouth every 8 (eight) hours as needed for anxiety.  Indication: Feeling Anxious, Feeling Tense   melatonin 5 MG Tabs Take 1 tablet (5 mg total) by mouth at bedtime.  Indication: Depression   mometasone-formoterol 100-5 MCG/ACT Aero Commonly known as: DULERA Inhale 2 puffs into the lungs 2 (two) times daily.  Indication: Chronic Obstructive Lung Disease   mometasone-formoterol 100-5 MCG/ACT Aero Commonly known  as: DULERA Inhale 2 puffs into the lungs 2 (two) times daily.  Indication: Chronic Obstructive Lung Disease   QUEtiapine 50 MG tablet Commonly known as: SEROQUEL Take 1 tablet (50 mg total) by mouth at bedtime.  Indication: Major Depressive Disorder, sleep and racing thoughts   thiamine 100 MG tablet Commonly known as: Vitamin B-1 Take 1 tablet (100 mg total) by mouth daily. Start taking on: January 08, 2024  Indication: Deficiency of Vitamin B1   traZODone 100 MG tablet Commonly known as: DESYREL Take 1 tablet (100 mg total) by mouth at bedtime.  Indication: Major Depressive Disorder        Follow-up Information     [facility] Follow up.   Why: You will have a walk appointment with Unk Pinto, peer support specialist Monday-Friday 8 AM. Contact information: [address] [place] [address] [phone]                 Follow-up recommendations:  Activity:  as tolerated Diet:  heart healthy  Comments:   Lexapro 20 mg PO daily: Continue for MDD. Monitor for efficacy and side effects. Melatonin 5 mg PO nightly: For insomnia. Trazodone 100 mg PO nightly: Continue for sleep support. National Suicide Prevention Lifeline: 988. Crisis Text Line: Text HOME to (720)838-6625. Albuterol (LABA) plus ICS: Start for reported dyspnea and wheezing Educate on the risks of obtaining Suboxone and Xanax from nonmedical sources. Psychiatric follow-up scheduled within one week to evaluate medication efficacy and safety.    Signed: Myriam Forehand, NP 01/07/2024, 2:42 PM

## 2024-01-07 NOTE — Progress Notes (Signed)
   01/07/24 0133  Psych Admission Type (Psych Patients Only)  Admission Status Voluntary  Psychosocial Assessment  Patient Complaints Anxiety  Eye Contact Fair  Facial Expression Anxious  Affect Anxious  Speech Logical/coherent  Interaction Assertive  Motor Activity Slow  Appearance/Hygiene In scrubs  Behavior Characteristics Cooperative;Appropriate to situation  Mood Anxious  Aggressive Behavior  Effect No apparent injury  Thought Process  Coherency WDL  Content WDL  Delusions None reported or observed  Perception WDL  Hallucination None reported or observed  Judgment WDL  Confusion None  Danger to Self  Current suicidal ideation?  ([place])  Agreement Not to Harm Self Yes  Danger to Others  Danger to Others None reported or observed

## 2024-01-07 NOTE — Group Note (Signed)
Recreation Therapy Group Note   Group Topic:Problem Solving  Group Date: 01/07/2024 Start Time: 1000 End Time: 1050 Facilitators: Rosina Lowenstein, LRT, CTRS Location:  Craft Room  Group Description: Life Boat. Patients were given the scenario that they are on a boat that is about to become shipwrecked, leaving them stranded on an [place]. They are asked to make a list of 15 different items that they want to take with them when they are stranded on the [place]. Patients are asked to rank their items from most important to least important, #1 being the most important and #15 being the least. Patients will work individually for the first round to come up with 15 items and then pair up with a peer(s) to condense their list and come up with one list of 15 items between the two of them. Patients or LRT will read aloud the 15 different items to the group after each round. LRT facilitated post-activity processing to discuss how this activity can be used in daily life post discharge.   Goal Area(s) Addressed:  Patient will identify priorities, wants and needs. Patient will communicate with LRT and peers. Patient will work collectively as a Administrator, Civil Service. Patient will work on Product manager.    Affect/Mood: N/A   Participation Level: Did not attend    Clinical Observations/Individualized Feedback: Patient did not attend group.   Plan: Continue to engage patient in RT group sessions 2-3x/week.   Rosina Lowenstein, LRT, CTRS 01/07/2024 1:13 PM

## 2024-01-07 NOTE — Plan of Care (Signed)
  Problem: Education: Goal: Knowledge of [place] [facility] Education information/materials will improve Outcome: Adequate for Discharge Goal: Emotional status will improve 01/07/2024 1016 by Gardiner Barefoot, RN Outcome: Adequate for Discharge 01/07/2024 1015 by Gardiner Barefoot, RN Outcome: Progressing Goal: Mental status will improve 01/07/2024 1016 by Gardiner Barefoot, RN Outcome: Adequate for Discharge 01/07/2024 1015 by Gardiner Barefoot, RN Outcome: Progressing Goal: Verbalization of understanding the information provided will improve 01/07/2024 1016 by Gardiner Barefoot, RN Outcome: Adequate for Discharge 01/07/2024 1015 by Gardiner Barefoot, RN Outcome: Progressing   Problem: Activity: Goal: Interest or engagement in activities will improve Outcome: Adequate for Discharge Goal: Sleeping patterns will improve Outcome: Adequate for Discharge   Problem: Coping: Goal: Ability to verbalize frustrations and anger appropriately will improve Outcome: Adequate for Discharge Goal: Ability to demonstrate self-control will improve Outcome: Adequate for Discharge   Problem: Health Behavior/Discharge Planning: Goal: Identification of resources available to assist in meeting health care needs will improve Outcome: Adequate for Discharge Goal: Compliance with treatment plan for underlying cause of condition will improve Outcome: Adequate for Discharge   Problem: Physical Regulation: Goal: Ability to maintain clinical measurements within normal limits will improve Outcome: Adequate for Discharge   Problem: Safety: Goal: Periods of time without injury will increase Outcome: Adequate for Discharge

## 2024-01-07 NOTE — Progress Notes (Signed)
  [facility] Adult Case Management Discharge Plan :  Will you be returning to the same living situation after discharge:  Yes,  pt is returning home. At discharge, do you have transportation home?: Yes,  [name] to assist in transportation needs. Do you have the ability to pay for your medications: Yes,  [place] MEDICAID PREPAID HEALTH PLAN / [place] MEDICAID HEALTHY BLUE  Release of information consent forms completed and in the chart;  Patient's signature needed at discharge.  Patient to Follow up at:  Follow-up Information     [facility] Follow up.   Why: You will have a walk appointment with Unk Pinto, peer support specialist Monday-Friday 8 AM. Contact information: [address] [place] [address] [phone]                 Next level of care provider has access to [facility] Link:no  Safety Planning and Suicide Prevention discussed: Yes,  SPE completed with the patient      Has patient been referred to the Quitline?: Patient refused referral for treatment  Patient has been referred for addiction treatment: Patient refused referral for treatment.  Harden Mo, LCSW 01/07/2024, 11:10 AM

## 2024-01-07 NOTE — BHH Counselor (Signed)
[name] spoke with the patient about discharge.  Patient reports that he does not have placement at this time.  [name] encouraged patient to speak with sister as well as landlord on placement.  Pt stated that he would. [name] then requeted that [name] follow up with [facility].  [name] informed pt that she wull follow up on shelter placements.  [name] reached out to [employer] and was informed no beds are available.  [name] researched [facility] flag shelters and was informed that white flag was in effect the night before.  [name] contacted [employer] and [employer] and was informed that the patient would need to come to the walk in Wednesday at 7AM.  [name] relayed this information to the patient.  Patient informed [name] that he was not interested in [facility] due to 2 year program.  He reports that he doesn't want to do a 2 year program. Patient reported that he is not smart enough to have contacted other agencies on the [employer] list provided.   [name] contacted pt's sister who reported that she has NOT heard from patient.  She reports that when she spoke with the patient she was told that he was going to rehab and needed her to get his belongings.  [name] informed that patient is declining referrals.  Sister reported that pt would need to go to shelter as he could NOT stay with her.  [name] obtained the pt's landlords humber and encouraged patient to contact her.   Patient continually stated that he is not smart enough to make calls on his own.  [name] pointed out that level of education does not effect ability and accountability and encouraged patient to have autonomy over his care.  Patient was then observed to be on the phone with someone, however, was loud and it is unclear with whom he speaks.  Penni Homans, MSW, LCSW 01/07/2024 11:01 AM

## 2024-01-07 NOTE — BHH Suicide Risk Assessment (Signed)
[facility] Discharge Suicide Risk Assessment   Principal Problem: Major depressive disorder, recurrent episode, moderate (HCC) Discharge Diagnoses: Principal Problem:   Major depressive disorder, recurrent episode, moderate (HCC) Active Problems:   Benzodiazepine abuse (HCC)   Buprenorphine and naloxone withdrawal (HCC)   Total Time spent with patient: 1 hour  Musculoskeletal: Strength & Muscle Tone: within normal limits Gait & Station: normal Patient leans: N/A  Psychiatric Specialty Exam  Presentation  General Appearance:  Fairly Groomed  Eye Contact: Minimal  Speech: Clear and Coherent; Normal Rate  Speech Volume: Normal  Handedness: Right   Mood and Affect  Mood: Euthymic  Duration of Depression Symptoms: Greater than two weeks  Affect: Flat   Thought Process  Thought Processes: Coherent  Descriptions of Associations:Intact  Orientation:Full (Time, Place and Person) (and situation)  Thought Content:WDL  History of Schizophrenia/Schizoaffective disorder:No  Duration of Psychotic Symptoms:none reported Hallucinations:Hallucinations: None  Ideas of Reference:None  Suicidal Thoughts:Suicidal Thoughts: No SI Passive Intent and/or Plan: -- (denies)  Homicidal Thoughts:Homicidal Thoughts: No   Sensorium  Memory: Immediate Good; Remote Good  Judgment: Fair  Insight: Fair   Chartered certified accountant: Fair  Attention Span: Fair  Recall: Good  Fund of Knowledge: Good  Language: Good   Psychomotor Activity  Psychomotor Activity: Psychomotor Activity: Normal   Assets  Assets: Communication Skills   Sleep  Sleep: Sleep: Good Number of Hours of Sleep: 7   Physical Exam: Physical Exam Vitals and nursing note reviewed.  Constitutional:      Appearance: Normal appearance.  HENT:     Head: Normocephalic and atraumatic.     Nose: Nose normal.  Pulmonary:     Comments: Non productive cough Musculoskeletal:         General: Normal range of motion.  Neurological:     General: No focal deficit present.     Mental Status: He is alert and oriented to person, place, and time. Mental status is at baseline.  Psychiatric:        Attention and Perception: Attention and perception normal.        Mood and Affect: Mood normal. Affect is flat.        Speech: Speech normal.        Behavior: Behavior normal. Behavior is cooperative.        Thought Content: Thought content normal.        Cognition and Memory: Cognition and memory normal.        Judgment: Judgment normal.    Review of Systems  All other systems reviewed and are negative.  Blood pressure 105/74, pulse 79, temperature 98.2 F (36.8 C), temperature source Oral, resp. rate 16, height 5\' 7"  (1.702 m), weight 51.3 kg, SpO2 94%. Body mass index is 17.7 kg/m.  Mental Status Per Nursing Assessment::   On Admission:  Suicidal ideation indicated by patient  Demographic Factors:  Male, Caucasian, Low socioeconomic status, and Unemployed  Loss Factors: Financial problems/change in socioeconomic status  Historical Factors: Impulsivity  Risk Reduction Factors:   Living with another person, especially a relative  Continued Clinical Symptoms:  Anorexia Nervosa Depression:   Comorbid alcohol abuse/dependence Impulsivity Insomnia Alcohol/Substance Abuse/Dependencies  Cognitive Features That Contribute To Risk:  None    Suicide Risk:  Minimal: No identifiable suicidal ideation.  Patients presenting with no risk factors but with morbid ruminations; may be classified as minimal risk based on the severity of the depressive symptoms   Follow-up Information     [facility]  Follow up.   Why: You will have a walk appointment with Unk Pinto, peer support specialist Monday-Friday 8 AM. Contact information: [address] [place] [address] [phone]                 Plan Of Care/Follow-up recommendations:   Activity:  as tolerated Diet:  heart healthy Lexapro 20 mg PO daily: Continue for MDD. Monitor for efficacy and side effects. Melatonin 5 mg PO nightly: For insomnia. Trazodone 100 mg PO nightly: Continue for sleep support. National Suicide Prevention Lifeline: 988. Crisis Text Line: Text HOME to [phone]. Albuterol (LABA) plus ICS: Start for reported dyspnea and wheezing Educate on the risks of obtaining Suboxone and Xanax from nonmedical sources. Psychiatric follow-up scheduled within one week to evaluate medication efficacy and safety.  Myriam Forehand, NP 01/07/2024, 2:25 PM

## 2024-01-07 NOTE — Group Note (Signed)
Date:  01/07/2024 Time:  12:51 PM  Group Topic/Focus:  Goals Group:   The focus of this group is to help patients establish daily goals to achieve during treatment and discuss how the patient can incorporate goal setting into their daily lives to aide in recovery. Managing Feelings:   The focus of this group is to identify what feelings patients have difficulty handling and develop a plan to handle them in a healthier way upon discharge.    Participation Level:  Active  Participation Quality:  Appropriate  Affect:  Appropriate  Cognitive:  Alert, Appropriate, and Oriented  Insight: Appropriate  Engagement in Group:  Developing/Improving and Engaged  Modes of Intervention:  Activity  Additional Comments:    Zuriyah Shatz 01/07/2024, 12:51 PM

## 2024-01-07 NOTE — BHH Counselor (Addendum)
[name] touched base with [facility] at [phone] and spoke with Zenovia Jordan, director to assess for possible placement options.   Mr. Luiz Blare confirmed bed availability and asked that patient call to conduct a phone screening.   [name] team provided patient with number and information to have patient call on his own behalf.   [name] team to continue to assess and engage in safe discharge planning.    Reymundo Poll, MSW, LCSWA 01/07/2024 10:34 AM

## 2024-01-07 NOTE — Progress Notes (Signed)
Patient ID: Corey House., male   DOB: 1966/06/04, 58 y.o.   MRN: [id number] Patient discharged via taxi. Denies SI/HI/AVH. No distress noted.

## 2024-01-07 NOTE — Plan of Care (Signed)
°  Problem: Education: °Goal: Emotional status will improve °Outcome: Progressing °Goal: Mental status will improve °Outcome: Progressing °Goal: Verbalization of understanding the information provided will improve °Outcome: Progressing °  °

## 2024-04-22 ENCOUNTER — Emergency Department (EMERGENCY_DEPARTMENT_HOSPITAL)
Admission: EM | Admit: 2024-04-22 | Discharge: 2024-04-23 | Disposition: A | Discharge status: Other Acute Inpatient Hospital | Source: Home / Self Care | Attending: Emergency Medicine | Admitting: Emergency Medicine

## 2024-04-22 ENCOUNTER — Other Ambulatory Visit: Payer: Self-pay

## 2024-04-22 ENCOUNTER — Encounter: Payer: Self-pay | Admitting: Emergency Medicine

## 2024-04-22 DIAGNOSIS — F411 Generalized anxiety disorder: Secondary | ICD-10-CM | POA: Insufficient documentation

## 2024-04-22 DIAGNOSIS — F331 Major depressive disorder, recurrent, moderate: Secondary | ICD-10-CM | POA: Diagnosis present

## 2024-04-22 DIAGNOSIS — R45851 Suicidal ideations: Secondary | ICD-10-CM | POA: Insufficient documentation

## 2024-04-22 DIAGNOSIS — F332 Major depressive disorder, recurrent severe without psychotic features: Secondary | ICD-10-CM | POA: Insufficient documentation

## 2024-04-22 LAB — CBC
HCT: 49.6 % (ref 39.0–52.0)
Hemoglobin: 16.4 g/dL (ref 13.0–17.0)
MCH: 30.7 pg (ref 26.0–34.0)
MCHC: 33.1 g/dL (ref 30.0–36.0)
MCV: 92.9 fL (ref 80.0–100.0)
Platelets: 298 10*3/uL (ref 150–400)
RBC: 5.34 MIL/uL (ref 4.22–5.81)
RDW: 13.2 % (ref 11.5–15.5)
WBC: 6.8 10*3/uL (ref 4.0–10.5)
nRBC: 0 % (ref 0.0–0.2)

## 2024-04-22 LAB — URINE DRUG SCREEN, QUALITATIVE (ARMC ONLY)
Amphetamines, Ur Screen: NOT DETECTED
Barbiturates, Ur Screen: NOT DETECTED
Benzodiazepine, Ur Scrn: NOT DETECTED
Cannabinoid 50 Ng, Ur ~~LOC~~: NOT DETECTED
Cocaine Metabolite,Ur ~~LOC~~: NOT DETECTED
MDMA (Ecstasy)Ur Screen: NOT DETECTED
Methadone Scn, Ur: NOT DETECTED
Opiate, Ur Screen: NOT DETECTED
Phencyclidine (PCP) Ur S: NOT DETECTED
Tricyclic, Ur Screen: NOT DETECTED

## 2024-04-22 LAB — COMPREHENSIVE METABOLIC PANEL WITH GFR
ALT: 15 U/L (ref 0–44)
AST: 17 U/L (ref 15–41)
Albumin: 4.4 g/dL (ref 3.5–5.0)
Alkaline Phosphatase: 61 U/L (ref 38–126)
Anion gap: 9 (ref 5–15)
BUN: 18 mg/dL (ref 6–20)
CO2: 33 mmol/L — ABNORMAL HIGH (ref 22–32)
Calcium: 9 mg/dL (ref 8.9–10.3)
Chloride: 100 mmol/L (ref 98–111)
Creatinine, Ser: 0.68 mg/dL (ref 0.61–1.24)
GFR, Estimated: >60 mL/min (ref >60)
Glucose, Bld: 94 mg/dL (ref 70–99)
Potassium: 3.9 mmol/L (ref 3.5–5.1)
Sodium: 142 mmol/L (ref 135–145)
Total Bilirubin: 0.4 mg/dL (ref 0.0–1.2)
Total Protein: 6.9 g/dL (ref 6.5–8.1)

## 2024-04-22 LAB — ETHANOL: Alcohol, Ethyl (B): <15 mg/dL (ref <15)

## 2024-04-22 MED ORDER — IBUPROFEN 600 MG PO TABS
600.0000 mg | ORAL_TABLET | Freq: Once | ORAL | Status: AC
  Administered 2024-04-22: 600 mg via ORAL
  Filled 2024-04-22: qty 1

## 2024-04-22 NOTE — ED Provider Notes (Signed)
 [facility] Provider Note    Event Date/Time   First MD Initiated Contact with Patient 04/22/24 2139     (approximate)   History   Suicidal   HPI Yunis Elg. is a 58 y.o. male with history of depression presenting today for suicidal ideation.  Patient states he has had suicidal ideations since getting discharged back in January for similar symptoms.  It has gotten worse over the past week or so.  Has not had any of his daily medications to help with symptoms.  Denies any plan.  No HI or hallucinations.  No other symptoms elsewise.  Notes daily tobacco use but no other drug use.     Physical Exam   Triage Vital Signs: ED Triage Vitals  Encounter Vitals Group     BP 04/22/24 2208 (!) 142/99     Systolic BP Percentile --      Diastolic BP Percentile --      Pulse Rate 04/22/24 2208 81     Resp 04/22/24 2208 18     Temp 04/22/24 2208 98.1 F (36.7 C)     Temp Source 04/22/24 2208 Oral     SpO2 04/22/24 2208 97 %     Weight 04/22/24 2137 113 lb 1.5 oz (51.3 kg)     Height 04/22/24 2208 5\' 4"  (1.626 m)     Head Circumference --      Peak Flow --      Pain Score --      Pain Loc --      Pain Education --      Exclude from Growth Chart --     Most recent vital signs: Vitals:   04/22/24 2208  BP: (!) 142/99  Pulse: 81  Resp: 18  Temp: 98.1 F (36.7 C)  SpO2: 97%   I have reviewed the vital signs. General:  Awake, alert, no acute distress. Head:  Normocephalic, Atraumatic. EENT:  PERRL, EOMI, Oral mucosa pink and moist, Neck is supple. Cardiovascular: Regular rate, 2+ distal pulses. Respiratory:  Normal respiratory effort, symmetrical expansion, no distress.   Extremities:  Moving all four extremities through full ROM without pain.   Neuro:  Alert and oriented.  Interacting appropriately.   Skin:  Warm, dry, no rash.   Psych: Appropriate affect.    ED Results / Procedures / Treatments   Labs (all labs ordered are listed, but  only abnormal results are displayed) Labs Reviewed  CBC  URINE DRUG SCREEN, QUALITATIVE ([facility] ONLY)  COMPREHENSIVE METABOLIC PANEL WITH GFR  ETHANOL     EKG    RADIOLOGY    PROCEDURES:  Critical Care performed: No  Procedures   MEDICATIONS ORDERED IN ED: Medications  ibuprofen  (ADVIL ) tablet 600 mg (600 mg Oral Given 04/22/24 2230)     IMPRESSION / MDM / ASSESSMENT AND PLAN / ED COURSE  I reviewed the triage vital signs and the nursing notes.                              Differential diagnosis includes, but is not limited to, depression, SI  Patient's presentation is most consistent with acute presentation with potential threat to life or bodily function.  Patient is a 58 year old male presenting today for depression and suicidal ideation.  No plan or HI.  No other acute medical complaints.  Will consult psychiatry for further evaluation.  Patient otherwise medically cleared.  He stated that  he would like some Suboxone  for "withdrawal".  Reviewed his PDMP and do not see any Suboxone  prescriptions since 2023.  Will withhold at this time.  Patient signed out to oncoming provider pending evaluation by psychiatry.  The patient has been placed in psychiatric observation due to the need to provide a safe environment for the patient while obtaining psychiatric consultation and evaluation, as well as ongoing medical and medication management to treat the patient's condition.  The patient has not been placed under full IVC at this time.     FINAL CLINICAL IMPRESSION(S) / ED DIAGNOSES   Final diagnoses:  Suicidal ideation     Rx / DC Orders   ED Discharge Orders     None        Note:  This document was prepared using Dragon voice recognition software and may include unintentional dictation errors.   Kandee Orion, MD 04/22/24 2238

## 2024-04-22 NOTE — ED Notes (Signed)
 Pt changed out into burgundy scrubs - all items placed into belonging bag, includes: - black sweatshirt, black pants, black shoes, black socks and phone

## 2024-04-22 NOTE — ED Triage Notes (Signed)
 Pt in voluntarily with SI, states "I have no desire to live anymore. I feel anxious and shaky all the time". Pt states he has been noncompliant with meds, was inpatient psych recently and never continued his meds. Denies any active plan, substance use or any hallucinations.

## 2024-04-23 ENCOUNTER — Inpatient Hospital Stay
Admission: AD | Admit: 2024-04-23 | Discharge: 2024-04-30 | DRG: 885 | Disposition: A | Discharge status: Home / Self Care | Source: Intra-hospital | Attending: Psychiatry | Admitting: Psychiatry

## 2024-04-23 ENCOUNTER — Other Ambulatory Visit: Payer: Self-pay

## 2024-04-23 ENCOUNTER — Encounter: Payer: Self-pay | Admitting: Adult Health

## 2024-04-23 DIAGNOSIS — Z961 Presence of intraocular lens: Secondary | ICD-10-CM | POA: Diagnosis present

## 2024-04-23 DIAGNOSIS — Z5948 Other specified lack of adequate food: Secondary | ICD-10-CM | POA: Diagnosis not present

## 2024-04-23 DIAGNOSIS — Z7951 Long term (current) use of inhaled steroids: Secondary | ICD-10-CM | POA: Diagnosis not present

## 2024-04-23 DIAGNOSIS — R519 Headache, unspecified: Secondary | ICD-10-CM | POA: Diagnosis present

## 2024-04-23 DIAGNOSIS — Z87442 Personal history of urinary calculi: Secondary | ICD-10-CM | POA: Diagnosis not present

## 2024-04-23 DIAGNOSIS — F332 Major depressive disorder, recurrent severe without psychotic features: Principal | ICD-10-CM | POA: Diagnosis present

## 2024-04-23 DIAGNOSIS — F331 Major depressive disorder, recurrent, moderate: Secondary | ICD-10-CM | POA: Diagnosis not present

## 2024-04-23 DIAGNOSIS — J449 Chronic obstructive pulmonary disease, unspecified: Secondary | ICD-10-CM | POA: Diagnosis present

## 2024-04-23 DIAGNOSIS — M791 Myalgia, unspecified site: Secondary | ICD-10-CM | POA: Diagnosis present

## 2024-04-23 DIAGNOSIS — F1310 Sedative, hypnotic or anxiolytic abuse, uncomplicated: Secondary | ICD-10-CM | POA: Diagnosis present

## 2024-04-23 DIAGNOSIS — Z5982 Transportation insecurity: Secondary | ICD-10-CM | POA: Diagnosis not present

## 2024-04-23 DIAGNOSIS — R45851 Suicidal ideations: Secondary | ICD-10-CM | POA: Diagnosis present

## 2024-04-23 DIAGNOSIS — F411 Generalized anxiety disorder: Secondary | ICD-10-CM

## 2024-04-23 DIAGNOSIS — F17210 Nicotine dependence, cigarettes, uncomplicated: Secondary | ICD-10-CM | POA: Diagnosis present

## 2024-04-23 DIAGNOSIS — F1110 Opioid abuse, uncomplicated: Secondary | ICD-10-CM | POA: Diagnosis present

## 2024-04-23 DIAGNOSIS — Z79899 Other long term (current) drug therapy: Secondary | ICD-10-CM | POA: Diagnosis not present

## 2024-04-23 DIAGNOSIS — Z9841 Cataract extraction status, right eye: Secondary | ICD-10-CM

## 2024-04-23 DIAGNOSIS — Z9842 Cataract extraction status, left eye: Secondary | ICD-10-CM

## 2024-04-23 DIAGNOSIS — Z5941 Food insecurity: Secondary | ICD-10-CM | POA: Diagnosis not present

## 2024-04-23 DIAGNOSIS — Z91148 Patient's other noncompliance with medication regimen for other reason: Secondary | ICD-10-CM

## 2024-04-23 DIAGNOSIS — R053 Chronic cough: Secondary | ICD-10-CM | POA: Diagnosis present

## 2024-04-23 MED ORDER — ALBUTEROL SULFATE HFA 108 (90 BASE) MCG/ACT IN AERS: 2.0000 | INHALATION_SPRAY | Freq: Four times a day (QID) | RESPIRATORY_TRACT | Status: DC | PRN

## 2024-04-23 MED ORDER — ESCITALOPRAM OXALATE 10 MG PO TABS
10.0000 mg | ORAL_TABLET | Freq: Every day | ORAL | Status: DC
  Administered 2024-04-23: 10 mg via ORAL
  Filled 2024-04-23: qty 1

## 2024-04-23 MED ORDER — DIPHENHYDRAMINE HCL 50 MG/ML IJ SOLN: 50.0000 mg | Freq: Three times a day (TID) | INTRAMUSCULAR | Status: DC | PRN

## 2024-04-23 MED ORDER — MAGNESIUM HYDROXIDE 400 MG/5ML PO SUSP: 30.0000 mL | Freq: Every day | ORAL | Status: DC | PRN

## 2024-04-23 MED ORDER — DIPHENHYDRAMINE HCL 25 MG PO CAPS
50.0000 mg | ORAL_CAPSULE | Freq: Three times a day (TID) | ORAL | Status: DC | PRN
  Administered 2024-04-26 – 2024-04-30 (×4): 50 mg via ORAL
  Filled 2024-04-23 (×4): qty 2

## 2024-04-23 MED ORDER — ENSURE ENLIVE PO LIQD: 237.0000 mL | Freq: Two times a day (BID) | ORAL | Status: DC

## 2024-04-23 MED ORDER — ESCITALOPRAM OXALATE 10 MG PO TABS
10.0000 mg | ORAL_TABLET | Freq: Every day | ORAL | Status: DC
Start: 2024-04-24 — End: 2024-04-30
  Administered 2024-04-24 – 2024-04-30 (×7): 10 mg via ORAL
  Filled 2024-04-23 (×7): qty 1

## 2024-04-23 MED ORDER — PROMETHAZINE HCL 25 MG PO TABS
25.0000 mg | ORAL_TABLET | Freq: Once | ORAL | Status: AC
  Administered 2024-04-23: 25 mg via ORAL
  Filled 2024-04-23: qty 1

## 2024-04-23 MED ORDER — CLONIDINE HCL 0.1 MG PO TABS
0.1000 mg | ORAL_TABLET | Freq: Once | ORAL | Status: AC
  Administered 2024-04-23: 0.1 mg via ORAL
  Filled 2024-04-23: qty 1

## 2024-04-23 MED ORDER — HALOPERIDOL LACTATE 5 MG/ML IJ SOLN: 10.0000 mg | Freq: Three times a day (TID) | INTRAMUSCULAR | Status: DC | PRN

## 2024-04-23 MED ORDER — LORAZEPAM 1 MG PO TABS
1.0000 mg | ORAL_TABLET | Freq: Once | ORAL | Status: AC
  Administered 2024-04-23: 1 mg via ORAL
  Filled 2024-04-23: qty 1

## 2024-04-23 MED ORDER — NICOTINE 14 MG/24HR TD PT24
14.0000 mg | MEDICATED_PATCH | Freq: Every day | TRANSDERMAL | Status: DC
  Administered 2024-04-24 – 2024-04-30 (×7): 14 mg via TRANSDERMAL
  Filled 2024-04-23 (×7): qty 1

## 2024-04-23 MED ORDER — HALOPERIDOL LACTATE 5 MG/ML IJ SOLN: 5.0000 mg | Freq: Three times a day (TID) | INTRAMUSCULAR | Status: DC | PRN

## 2024-04-23 MED ORDER — ACETAMINOPHEN 325 MG PO TABS
650.0000 mg | ORAL_TABLET | Freq: Four times a day (QID) | ORAL | Status: DC | PRN
  Administered 2024-04-23 – 2024-04-30 (×10): 650 mg via ORAL
  Filled 2024-04-23 (×12): qty 2

## 2024-04-23 MED ORDER — QUETIAPINE FUMARATE 25 MG PO TABS
50.0000 mg | ORAL_TABLET | Freq: Every day | ORAL | Status: DC
  Administered 2024-04-23 – 2024-04-27 (×5): 50 mg via ORAL
  Filled 2024-04-23 (×5): qty 2

## 2024-04-23 MED ORDER — HALOPERIDOL 5 MG PO TABS
5.0000 mg | ORAL_TABLET | Freq: Three times a day (TID) | ORAL | Status: DC | PRN
  Administered 2024-04-26 – 2024-04-30 (×4): 5 mg via ORAL
  Filled 2024-04-23 (×4): qty 1

## 2024-04-23 MED ORDER — LORAZEPAM 2 MG/ML IJ SOLN: 2.0000 mg | Freq: Three times a day (TID) | INTRAMUSCULAR | Status: DC | PRN

## 2024-04-23 MED ORDER — QUETIAPINE FUMARATE 25 MG PO TABS: 50.0000 mg | ORAL_TABLET | Freq: Every day | ORAL | Status: DC

## 2024-04-23 MED ORDER — ALUM & MAG HYDROXIDE-SIMETH 200-200-20 MG/5ML PO SUSP: 30.0000 mL | ORAL | Status: DC | PRN

## 2024-04-23 NOTE — ED Notes (Signed)
Report called to Annie RN

## 2024-04-23 NOTE — Group Note (Signed)
 [facility] LCSW Group Therapy Note   Group Date: 04/23/2024 Start Time: 1300 End Time: 1400   Type of Therapy/Topic:  Group Therapy:  Balance in Life  Participation Level:  Did Not Attend   Description of Group:    This group will address the concept of balance and how it feels and looks when one is unbalanced. Patients will be encouraged to process areas in their lives that are out of balance, and identify reasons for remaining unbalanced. Facilitators will guide patients utilizing problem- solving interventions to address and correct the stressor making their life unbalanced. Understanding and applying boundaries will be explored and addressed for obtaining  and maintaining a balanced life. Patients will be encouraged to explore ways to assertively make their unbalanced needs known to significant others in their lives, using other group members and facilitator for support and feedback.  Therapeutic Goals: Patient will identify two or more emotions or situations they have that consume much of in their lives. Patient will identify signs/triggers that life has become out of balance:  Patient will identify two ways to set boundaries in order to achieve balance in their lives:  Patient will demonstrate ability to communicate their needs through discussion and/or role plays  Summary of Patient Progress:    X    Therapeutic Modalities:   Cognitive Behavioral Therapy Solution-Focused Therapy Assertiveness Training   Larri Ply, LCSW

## 2024-04-23 NOTE — Progress Notes (Signed)
 Patient ID: Corey Valbuena., male   DOB: 01/28/1966, 58 y.o.   MRN: [id number]  Admissions note:  Consents signed, handbook detailing the patient's rights, responsibilities, and visitor guidelines provided. Skin/belongings search completed and patient oriented to unit. Patient stable at this time. Patient given the opportunity to express concerns and ask questions. Patient given toiletries. RN will continue to monitor. Patient main concern is feeling hopeless due to his depression.

## 2024-04-23 NOTE — ED Notes (Signed)
 Per [facility], [facility] had pt to sign vol consent for [facility] BM[facility] placement . [facility] signed as witness and gave form to Geographical information systems officer.  Macky Sayres, [facility]

## 2024-04-23 NOTE — ED Notes (Signed)
vol/psych consult ordered/pending.. 

## 2024-04-23 NOTE — Plan of Care (Signed)
  Problem: Education: Goal: Knowledge of [facility] Education information/materials will improve Outcome: Progressing Goal: Emotional status will improve Outcome: Not Progressing Goal: Mental status will improve Outcome: Not Progressing Goal: Verbalization of understanding the information provided will improve Outcome: Progressing   Problem: Activity: Goal: Interest or engagement in activities will improve Outcome: Not Progressing Goal: Sleeping patterns will improve Outcome: Progressing

## 2024-04-23 NOTE — Group Note (Signed)
 Date:  04/23/2024 Time:  6:50 PM  Group Topic/Focus:  Outside activities    Participation Level:  Did Not Attend  Almarie Arias 04/23/2024, 6:50 PM

## 2024-04-23 NOTE — BH Assessment (Signed)
 Comprehensive Clinical Assessment (CCA) Screening, Triage and Referral Note  04/23/2024 Corey House [id number] Recommendations for Services/Supports/Treatments: Disposition pending. Corey House. "Corey House" is a 58 year old, English speaking, Caucasian male. Pt presented to [facility] ED voluntarily. Per triage note: Pt in voluntarily with SI, states "I have no desire to live anymore. I feel anxious and shaky all the time". Pt states he has been noncompliant with meds, was inpatient psych recently and never continued his meds. Denies any active plan, substance use or any hallucinations.  Pt was lying awake in bed upon this writer's arrival. Pt's speech was clear and coherent. Pt reported that he has felt on the verge of panic for the past month. Pt was visibly in distress and appeared remarkably anxious. Pt reported that he has not had access to his psych medications due to an inability to afford them. Pt identified losing his job and financial issues as his main stressors. Pt complained of pain sensations on the side of his head. Pt reported that he has suicidal thoughts, daily; however, he is unable to identify why. When probed, pt. reported having a hx of depression. Pt is not in therapy and does not receive any services. Pt was oriented x4. Motor behavior was unremarkable. Pt did not appear to be responding to internal or external stimuli. Eye contact was staring and intense. Pt's mood is anxious; affect is congruent. The patient continued to endorse SI. Pt denied current HI and AV/H.  Chief Complaint:  Chief Complaint  Patient presents with   Suicidal   Visit Diagnosis: Major depressive disorder, recurrent episode, moderate (HCC) Active Problems:   Benzodiazepine abuse (HCC)   Suicidal ideations   Patient Reported Information How did you hear about us ? No data recorded What Is the Reason for Your Visit/Call Today? Having thoughts of ending his life. States he has gave up on life.  How  Long Has This Been Causing You Problems? 1 wk - 1 month  What Do You Feel Would Help You the Most Today? Treatment for Depression or other mood problem   Have You Recently Had Any Thoughts About Hurting Yourself? Yes  Are You Planning to Commit Suicide/Harm Yourself At This time? Yes   Have you Recently Had Thoughts About Hurting Someone Corey House? No  Are You Planning to Harm Someone at This Time? No  Explanation: No data recorded  Have You Used Any Alcohol or Drugs in the Past 24 Hours? No data recorded How Long Ago Did You Use Drugs or Alcohol? No data recorded What Did You Use and How Much? No data recorded  Do You Currently Have a Therapist/Psychiatrist? No data recorded Name of Therapist/Psychiatrist: No data recorded  Have You Been Recently Discharged From Any Office Practice or Programs? No  Explanation of Discharge From Practice/Program: No data recorded   CCA Screening Triage Referral Assessment Type of Contact: Face-to-Face  Telemedicine Service Delivery:   Is this Initial or Reassessment?   Date Telepsych consult ordered in [facility]:    Time Telepsych consult ordered in [facility]:    Location of Assessment: [facility] ED  Provider Location: [facility] ED    Collateral Involvement: No data recorded  Does Patient Have a Court Appointed Legal Guardian? No data recorded Name and Contact of Legal Guardian: No data recorded If Minor and Not Living with Parent(s), Who has Custody? No data recorded Is CPS involved or ever been involved? Never  Is APS involved or ever been involved? Never   Patient Determined To Be  At Risk for Harm To Self or Others Based on Review of Patient Reported Information or Presenting Complaint? Yes, for Self-Harm  Method: No Plan  Availability of Means: No data recorded Intent: No data recorded Notification Required: No data recorded Additional Information for Danger to Others Potential: No data recorded Additional Comments for Danger to Others Potential: No  data recorded Are There Guns or Other Weapons in Your Home? No  Types of Guns/Weapons: No data recorded Are These Weapons Safely Secured?                            No  Who Could Verify You Are Able To Have These Secured: No data recorded Do You Have any Outstanding Charges, Pending Court Dates, Parole/Probation? No data recorded Contacted To Inform of Risk of Harm To Self or Others: No data recorded  Does Patient Present under Involuntary Commitment? No    County of Residence: [place]   Patient Currently Receiving the Following Services: Not Receiving Services   Determination of Need: Emergent (2 hours)   Options For Referral: ED Referral   Disposition Recommendation per psychiatric provider: Pending  Mina Babula R Neta Upadhyay, LCAS

## 2024-04-23 NOTE — Group Note (Signed)
 Date:  04/23/2024 Time:  10:07 PM  Group Topic/Focus:  Wrap-Up Group:   The focus of this group is to help patients review their daily goal of treatment and discuss progress on daily workbooks.    Participation Level:  Active  Participation Quality:  Appropriate  Affect:  Appropriate  Cognitive:  Alert  Insight: Appropriate  Engagement in Group:  Engaged  Modes of Intervention:  Activity  Additional Comments:    Marianna Shirk Versa Craton 04/23/2024, 10:07 PM
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
## 2024-04-23 NOTE — BH Assessment (Addendum)
 Pt. was accepted to [facility] on 04/23/24 pending discharged. Bed assignment: 323  Pt meets inpatient criteria per Chaya Cord, NP  Attending physician will be Dr. Belvie Boyers, MD  Report can be called to [phone]  ER staff is aware of the admission -Carlene, ER Secretary -Dr. Azalee Bolds, ER MD -Chauncey Cora, Patient's Nurse

## 2024-04-23 NOTE — ED Notes (Signed)
 Pt given meal tray at this time. Pt offered shower but refused.

## 2024-04-23 NOTE — Group Note (Signed)
 Date:  04/23/2024 Time:  10:25 PM  Group Topic/Focus:  Wrap-Up Group:   The focus of this group is to help patients review their daily goal of treatment and discuss progress on daily workbooks.    Participation Level:  Active  Participation Quality:  Appropriate  Affect:  Appropriate  Cognitive:  Appropriate  Insight: Appropriate  Engagement in Group:  Engaged  Modes of Intervention:  Activity  Additional Comments:    Marianna Shirk Cystal Shannahan 04/23/2024, 10:25 PM

## 2024-04-24 ENCOUNTER — Encounter: Payer: Self-pay | Admitting: Adult Health

## 2024-04-24 DIAGNOSIS — F332 Major depressive disorder, recurrent severe without psychotic features: Secondary | ICD-10-CM

## 2024-04-24 MED ORDER — BOOST / RESOURCE BREEZE PO LIQD CUSTOM: 1.0000 | Freq: Three times a day (TID) | ORAL | Status: DC

## 2024-04-24 MED ORDER — CLONIDINE HCL 0.1 MG PO TABS
0.1000 mg | ORAL_TABLET | Freq: Four times a day (QID) | ORAL | Status: AC
  Administered 2024-04-24 – 2024-04-26 (×7): 0.1 mg via ORAL
  Filled 2024-04-24 (×9): qty 1

## 2024-04-24 MED ORDER — METHOCARBAMOL 500 MG PO TABS
500.0000 mg | ORAL_TABLET | Freq: Three times a day (TID) | ORAL | Status: AC | PRN
  Administered 2024-04-27 – 2024-04-28 (×3): 500 mg via ORAL
  Filled 2024-04-24 (×3): qty 1

## 2024-04-24 MED ORDER — ADULT MULTIVITAMIN W/MINERALS CH
1.0000 | ORAL_TABLET | Freq: Every day | ORAL | Status: DC
  Administered 2024-04-25 – 2024-04-30 (×6): 1 via ORAL
  Filled 2024-04-24 (×7): qty 1

## 2024-04-24 MED ORDER — HYDROXYZINE HCL 25 MG PO TABS
25.0000 mg | ORAL_TABLET | Freq: Four times a day (QID) | ORAL | Status: AC | PRN
  Administered 2024-04-25 – 2024-04-29 (×7): 25 mg via ORAL
  Filled 2024-04-24 (×7): qty 1

## 2024-04-24 MED ORDER — NAPROXEN 500 MG PO TABS
500.0000 mg | ORAL_TABLET | Freq: Two times a day (BID) | ORAL | Status: AC | PRN
  Administered 2024-04-26 – 2024-04-28 (×4): 500 mg via ORAL
  Filled 2024-04-24 (×5): qty 1

## 2024-04-24 MED ORDER — CLONIDINE HCL 0.1 MG PO TABS
0.1000 mg | ORAL_TABLET | Freq: Every day | ORAL | Status: AC
  Administered 2024-04-29 – 2024-04-30 (×2): 0.1 mg via ORAL
  Filled 2024-04-24 (×2): qty 1

## 2024-04-24 MED ORDER — CLONIDINE HCL 0.1 MG PO TABS
0.1000 mg | ORAL_TABLET | ORAL | Status: AC
  Administered 2024-04-27 – 2024-04-28 (×4): 0.1 mg via ORAL
  Filled 2024-04-24 (×4): qty 1

## 2024-04-24 MED ORDER — DICYCLOMINE HCL 20 MG PO TABS: 20.0000 mg | ORAL_TABLET | Freq: Four times a day (QID) | ORAL | Status: AC | PRN

## 2024-04-24 MED ORDER — LOPERAMIDE HCL 2 MG PO CAPS: 2.0000 mg | ORAL_CAPSULE | ORAL | Status: AC | PRN

## 2024-04-24 MED ORDER — ONDANSETRON 4 MG PO TBDP
4.0000 mg | ORAL_TABLET | Freq: Four times a day (QID) | ORAL | Status: AC | PRN
  Administered 2024-04-26: 4 mg via ORAL
  Filled 2024-04-24: qty 1

## 2024-04-24 NOTE — Progress Notes (Signed)
 NUTRITION ASSESSMENT  Pt identified as at risk on the Malnutrition Screen Tool  INTERVENTION:  -D?c Ensure Enlive po BID, each supplement provides 350 kcal and 20 grams of protein.  -Boost Breeze po TID, each supplement provides 250 kcal and 9 grams of protein  -MVI with minerals daily -Continue regular diet   NUTRITION DIAGNOSIS: Unintentional weight loss related to sub-optimal intake as evidenced by pt report.   Goal: Pt to meet >/= 90% of their estimated nutrition needs.  Monitor:  PO intake  Assessment:   Pt admitted for evaluation of suicidal thoughts. He carries the psychiatric diagnoses of    depression, opioid use disorder, benzodiazepine abuse  and has a past medical history of  COPD.   Pt admitted with MDD and GAD.   Pt with history of opioid abuse, but UDS negative.   Pt currently on a regular diet. Pt is refusing Ensure supplements.   Pt has been participating in group sessions.  Reviewed wt hx; pt has experienced a 2.7% wt loss over the past 4 months, which is not significant for time frame.    Medications reviewed and include lexapro .   Labs reviewed: CBGS: 131.    58 y.o. male  Height: Ht Readings from Last 1 Encounters:  04/23/24 5\' 4"  (1.626 m)    Weight: Wt Readings from Last 1 Encounters:  04/23/24 49.9 kg    Weight Hx: Wt Readings from Last 10 Encounters:  04/23/24 49.9 kg  04/22/24 49.9 kg  12/27/23 51.3 kg  01/03/23 54.4 kg  10/04/21 51.7 kg  08/09/21 52.2 kg  03/19/21 59 kg  04/16/20 59 kg  03/06/20 59 kg  11/14/19 59 kg    BMI:  Body mass index is 18.88 kg/m. BMI WDL.   Estimated Nutritional Needs: Kcal: 25-30 kcal/kg Protein: > 1 gram protein/kg Fluid: 1 ml/kcal  Diet Order:  Diet Order             Diet regular Room service appropriate? No; Fluid consistency: Thin  Diet effective now                  Pt is also offered choice of unit snacks mid-morning and mid-afternoon.  Pt is eating as desired.   Lab  results and medications reviewed.   Herschel Lords, RD, LDN, CDCES Registered Dietitian III Certified Diabetes Care and Education Specialist If unable to reach this RD, please use "RD Inpatient" group chat on secure chat between hours of 8am-4 pm daily

## 2024-04-24 NOTE — [facility] Suicide Risk Assessment (Signed)
 [facility] INPATIENT:  Family/Significant Other Suicide Prevention Education  Suicide Prevention Education:  Patient Refusal for Family/Significant Other Suicide Prevention Education: The patient Corey House. has refused to provide written consent for family/significant other to be provided Family/Significant Other Suicide Prevention Education during admission and/or prior to discharge.  Physician notified.  SPE completed with pt, as pt refused to consent to family contact. SPI pamphlet provided to pt and pt was encouraged to share information with support network, ask questions, and talk about any concerns relating to SPE. Pt denies access to guns/firearms and verbalized understanding of information provided. Mobile Crisis information also provided to pt.    Valente Fosberg M Tenia Goh 04/24/2024, 1:32 PM

## 2024-04-24 NOTE — Group Note (Signed)
 Date:  04/24/2024 Time:  10:57 PM  Group Topic/Focus:  Wrap-Up Group:   The focus of this group is to help patients review their daily goal of treatment and discuss progress on daily workbooks.    Participation Level:  Did Not Attend  Participation Quality:  NONE  Affect:  NONE  Cognitive:  NONE  Insight: None  Engagement in Group:  NONE  Modes of Intervention:  NONE  Additional Comments:  NONE   Fabiola Holy 04/24/2024, 10:57 PM

## 2024-04-24 NOTE — Plan of Care (Signed)
  Problem: Activity: Goal: Sleeping patterns will improve Outcome: Progressing   

## 2024-04-24 NOTE — [facility] Suicide Risk Assessment (Signed)
 Suicide Risk Assessment  Admission Assessment    [facility] Admission Suicide Risk Assessment   Nursing information obtained from:    Demographic factors:    Current Mental Status:    Loss Factors:    Historical Factors:    Risk Reduction Factors:     Total Time spent with patient: 1 hour Principal Problem: MDD (major depressive disorder), recurrent episode, severe (HCC) Diagnosis:  Principal Problem:   MDD (major depressive disorder), recurrent episode, severe (HCC)  Subjective Data: 58 year old Caucasian male with reported history of MDD, opioid use disorder, noncompliance with medication regimen voluntarily presented to [facility] ED on 5/7 with chief complaints of no desire to live anymore, feeling anxious and shaky all the time. Reported depressed mood, lack of motivation, impaired sleep and appetite for several months, having difficulty finding employment, endorsed suicidal ideation with thoughts of hanging himself. Was admitted here at [facility] inpatient [facility] unit in January 2025, for similar complaints, was stabilized on Lexapro , Seroquel , clonidine  and trazodone . UDS negative, BAL less than 15. QTc 429.   Continued Clinical Symptoms:    The "Alcohol Use Disorders Identification Test", Guidelines for Use in Primary Care, Second Edition.  World Science writer [facility]). Score between 0-7:  no or low risk or alcohol related problems. Score between 8-15:  moderate risk of alcohol related problems. Score between 16-19:  high risk of alcohol related problems. Score 20 or above:  warrants further diagnostic evaluation for alcohol dependence and treatment.   CLINICAL FACTORS:   Severe Anxiety and/or Agitation Depression:   Anhedonia Comorbid alcohol abuse/dependence Insomnia Alcohol/Substance Abuse/Dependencies Unstable or Poor Therapeutic Relationship Previous Psychiatric Diagnoses and Treatments   Musculoskeletal: Strength & Muscle Tone: within normal limits Gait & Station: normal Patient  leans: N/A  Psychiatric Specialty Exam:  Presentation  General Appearance:  Disheveled  Eye Contact: Fair  Speech: Clear and Coherent; Normal Rate  Speech Volume: Normal  Handedness: Right   Mood and Affect  Mood: Dysphoric; Depressed; Anxious  Affect: Blunt   Thought Process  Thought Processes: Coherent; Goal Directed  Descriptions of Associations:Intact  Orientation:Full (Time, Place and Person)  Thought Content:WDL  History of Schizophrenia/Schizoaffective disorder:No  Duration of Psychotic Symptoms:No data recorded Hallucinations:Hallucinations: None  Ideas of Reference:None  Suicidal Thoughts:Suicidal Thoughts: No  Homicidal Thoughts:Homicidal Thoughts: No   Sensorium  Memory: Immediate Good; Recent Good  Judgment: Fair  Insight: Fair   Art therapist  Concentration: Good  Attention Span: Good  Recall: Good  Fund of Knowledge: Fair  Language: Fair   Psychomotor Activity  Psychomotor Activity:Psychomotor Activity: Normal   Assets  Assets: Communication Skills; Desire for Improvement; Leisure Time; Physical Health   Sleep  Sleep:Sleep: Poor    Physical Exam: Vitals and nursing note reviewed.  Eyes:     Extraocular Movements: Extraocular movements intact.  Pulmonary:     Effort: Pulmonary effort is normal.  Neurological:     Mental Status: He is alert and oriented to person, place, and time.  Psychiatric:        Attention and Perception: Attention and perception normal.        Mood and Affect: Mood is anxious and depressed.        Speech: Speech normal.        Behavior: Behavior is withdrawn. Behavior is cooperative.        Thought Content: Thought content normal.        Cognition and Memory: Cognition and memory normal.      Review of Systems  Musculoskeletal:  Positive for myalgias.  Neurological:  Positive for headaches.  Psychiatric/Behavioral:  Positive for depression and substance abuse. The  patient is nervous/anxious and has insomnia.   All other systems reviewed and are negative.  Blood pressure (!) 128/96, pulse 70, temperature 97.6 F (36.4 C), temperature source Temporal, resp. rate 16, height 5\' 4"  (1.626 m), weight 49.9 kg, SpO2 95%. Body mass index is 18.88 kg/m.   COGNITIVE FEATURES THAT CONTRIBUTE TO RISK:  None    SUICIDE RISK:   Minimal: No identifiable suicidal ideation.  Patients presenting with no risk factors but with morbid ruminations; may be classified as minimal risk based on the severity of the depressive symptoms  PLAN OF CARE:  Crisis stabilization, psychiatric evaluation, therapeutic milieu, group participation, medication management for depression, anxiety, opioid withdrawal, and development of an individualized safety plan.  I certify that inpatient services furnished can reasonably be expected to improve the patient's condition.   Patton Swisher, PA-C 04/24/2024, 5:25 PM

## 2024-04-24 NOTE — BH IP Treatment Plan (Signed)
 Interdisciplinary Treatment and Diagnostic Plan Update  04/24/2024 Time of Session: 10:36AM Trillion Renicker. MRN: [id number]  Principal Diagnosis: MDD (major depressive disorder), recurrent episode, severe (HCC)  Secondary Diagnoses: Principal Problem:   MDD (major depressive disorder), recurrent episode, severe (HCC)   Current Medications:  Current Facility-Administered Medications  Medication Dose Route Frequency Provider Last Rate Last Admin   acetaminophen  (TYLENOL ) tablet 650 mg  650 mg Oral Q6H PRN Mills, Shnese E, NP   650 mg at 04/23/24 1453   albuterol  (VENTOLIN  HFA) 108 (90 Base) MCG/ACT inhaler 2 puff  2 puff Inhalation Q6H PRN Mills, Shnese E, NP       alum & mag hydroxide-simeth (MAALOX/MYLANTA) 200-200-20 MG/5ML suspension 30 mL  30 mL Oral Q4H PRN Mills, Shnese E, NP       haloperidol  (HALDOL ) tablet 5 mg  5 mg Oral TID PRN Doneen Fuelling, NP       And   diphenhydrAMINE  (BENADRYL ) capsule 50 mg  50 mg Oral TID PRN Mills, Shnese E, NP       haloperidol  lactate (HALDOL ) injection 5 mg  5 mg Intramuscular TID PRN Doneen Fuelling, NP       And   diphenhydrAMINE  (BENADRYL ) injection 50 mg  50 mg Intramuscular TID PRN Orvil Bland E, NP       And   LORazepam  (ATIVAN ) injection 2 mg  2 mg Intramuscular TID PRN Mills, Shnese E, NP       haloperidol  lactate (HALDOL ) injection 10 mg  10 mg Intramuscular TID PRN Doneen Fuelling, NP       And   diphenhydrAMINE  (BENADRYL ) injection 50 mg  50 mg Intramuscular TID PRN Doneen Fuelling, NP       And   LORazepam  (ATIVAN ) injection 2 mg  2 mg Intramuscular TID PRN Mills, Shnese E, NP       escitalopram  (LEXAPRO ) tablet 10 mg  10 mg Oral Daily Mills, Shnese E, NP   10 mg at 04/24/24 3086   feeding supplement (BOOST / RESOURCE BREEZE) liquid 1 Container  1 Container Oral TID BM Jadapalle, Sree, MD       magnesium  hydroxide (MILK OF MAGNESIA) suspension 30 mL  30 mL Oral Daily PRN Mills, Shnese E, NP       multivitamin with minerals  tablet 1 tablet  1 tablet Oral Daily Jadapalle, Sree, MD       nicotine  (NICODERM CQ  - dosed in mg/24 hours) patch 14 mg  14 mg Transdermal Daily Tingling, Stephanie, PA-C   14 mg at 04/24/24 5784   QUEtiapine  (SEROQUEL ) tablet 50 mg  50 mg Oral QHS Mills, Shnese E, NP   50 mg at 04/23/24 2115   PTA Medications: Medications Prior to Admission  Medication Sig Dispense Refill Last Dose/Taking   albuterol  (VENTOLIN  HFA) 108 (90 Base) MCG/ACT inhaler Inhale 2 puffs into the lungs every 6 (six) hours as needed for wheezing or shortness of breath. 8 g 2    albuterol  (VENTOLIN  HFA) 108 (90 Base) MCG/ACT inhaler Inhale 2 puffs into the lungs every 6 (six) hours. 1530 g 0    cloNIDine  (CATAPRES ) 0.1 MG tablet Take 1 tablet (0.1 mg total) by mouth 2 (two) times daily. 60 tablet 11    escitalopram  (LEXAPRO ) 20 MG tablet Take 1 tablet (20 mg total) by mouth daily for 3 days. 3 tablet 0    mometasone -formoterol  (DULERA ) 100-5 MCG/ACT AERO Inhale 2 puffs into the lungs 2 (two) times daily.  39 g 1    mometasone -formoterol  (DULERA ) 100-5 MCG/ACT AERO Inhale 2 puffs into the lungs 2 (two) times daily. 1 each 0    QUEtiapine  (SEROQUEL ) 50 MG tablet Take 1 tablet (50 mg total) by mouth at bedtime. 30 tablet 0    traZODone  (DESYREL ) 100 MG tablet Take 1 tablet (100 mg total) by mouth at bedtime. 30 tablet 0     Patient Stressors:    Patient Strengths:    Treatment Modalities: Medication Management, Group therapy, Case management,  1 to 1 session with clinician, Psychoeducation, Recreational therapy.   Physician Treatment Plan for Primary Diagnosis: MDD (major depressive disorder), recurrent episode, severe (HCC) Long Term Goal(s):     Short Term Goals:    Medication Management: Evaluate patient's response, side effects, and tolerance of medication regimen.  Therapeutic Interventions: 1 to 1 sessions, Unit Group sessions and Medication administration.  Evaluation of Outcomes: Not Met  Physician  Treatment Plan for Secondary Diagnosis: Principal Problem:   MDD (major depressive disorder), recurrent episode, severe (HCC)  Long Term Goal(s):     Short Term Goals:       Medication Management: Evaluate patient's response, side effects, and tolerance of medication regimen.  Therapeutic Interventions: 1 to 1 sessions, Unit Group sessions and Medication administration.  Evaluation of Outcomes: Not Met   RN Treatment Plan for Primary Diagnosis: MDD (major depressive disorder), recurrent episode, severe (HCC) Long Term Goal(s): Knowledge of disease and therapeutic regimen to maintain health will improve  Short Term Goals: Ability to demonstrate self-control, Ability to participate in decision making will improve, Ability to verbalize feelings will improve, Ability to disclose and discuss suicidal ideas, Ability to identify and develop effective coping behaviors will improve, and Compliance with prescribed medications will improve  Medication Management: RN will administer medications as ordered by provider, will assess and evaluate patient's response and provide education to patient for prescribed medication. RN will report any adverse and/or side effects to prescribing provider.  Therapeutic Interventions: 1 on 1 counseling sessions, Psychoeducation, Medication administration, Evaluate responses to treatment, Monitor vital signs and CBGs as ordered, Perform/monitor CIWA, COWS, AIMS and Fall Risk screenings as ordered, Perform wound care treatments as ordered.  Evaluation of Outcomes: Not Met   LCSW Treatment Plan for Primary Diagnosis: MDD (major depressive disorder), recurrent episode, severe (HCC) Long Term Goal(s): Safe transition to appropriate next level of care at discharge, Engage patient in therapeutic group addressing interpersonal concerns.  Short Term Goals: Engage patient in aftercare planning with referrals and resources, Increase social support, Increase ability to  appropriately verbalize feelings, Increase emotional regulation, Facilitate acceptance of mental health diagnosis and concerns, Facilitate patient progression through stages of change regarding substance use diagnoses and concerns, Identify triggers associated with mental health/substance abuse issues, and Increase skills for wellness and recovery  Therapeutic Interventions: Assess for all discharge needs, 1 to 1 time with Social worker, Explore available resources and support systems, Assess for adequacy in community support network, Educate family and significant other(s) on suicide prevention, Complete Psychosocial Assessment, Interpersonal group therapy.  Evaluation of Outcomes: Not Met   Progress in Treatment: Attending groups: No. Participating in groups: No. Taking medication as prescribed: Yes. Toleration medication: Yes. Family/Significant other contact made: No, will contact:  once permission has been granted Patient understands diagnosis: Yes. Discussing patient identified problems/goals with staff: Yes. Medical problems stabilized or resolved: Yes. Denies suicidal/homicidal ideation: No. Issues/concerns per patient self-inventory: No. Other: none  New problem(s) identified: No, Describe:  none  New  Short Term/Long Term Goal(s): detox, elimination of symptoms of psychosis, medication management for mood stabilization; elimination of SI thoughts; development of comprehensive mental wellness/sobriety plan.   Patient Goals:  "get better"  Discharge Plan or Barriers: [name] to assist patient in development of appropriate treatment plans.  Patient reports that he is currently homeless.   Reason for Continuation of Hospitalization: Anxiety Depression Medication stabilization Suicidal ideation Withdrawal symptoms  Estimated Length of Stay: 1-7 days  Last 3 [place] Suicide Severity Risk Score: Flowsheet Row Admission (Current) from 04/23/2024 in [facility]  ED from 04/22/2024 in [facility] Emergency Department at [facility] Admission (Discharged) from 12/27/2023 in [facility] INPATIENT BEHAVIORAL MEDICINE  C-SSRS RISK CATEGORY Low Risk High Risk High Risk       Last PHQ 2/9 Scores:     No data to display          Scribe for Treatment Team: Larri Ply, Buzz Cass 04/24/2024 12:34 PM

## 2024-04-24 NOTE — Plan of Care (Signed)

## 2024-04-24 NOTE — H&P (Signed)
 Psychiatric Admission Assessment Adult  Patient Identification: Corey House. MRN:  [id number] Date of Evaluation:  04/24/2024 Chief Complaint:  MDD (major depressive disorder), recurrent episode, severe (HCC) [F33.2] Principal Diagnosis: MDD (major depressive disorder), recurrent episode, severe (HCC) Diagnosis:  Principal Problem:   MDD (major depressive disorder), recurrent episode, severe (HCC)  History of Present Illness:  58 year old Caucasian male with reported history of MDD, opioid use disorder, noncompliance with medication regimen voluntarily presented to [facility] ED on 5/7 with chief complaints of no desire to live anymore, feeling anxious and shaky all the time.  Reported depressed mood, lack of motivation, impaired sleep and appetite for several months, having difficulty finding employment, endorsed suicidal ideation with thoughts of hanging himself.  Was admitted here at [facility] inpatient [facility] unit in January 2025, for similar complaints, was stabilized on Lexapro , Seroquel , clonidine  and trazodone .  UDS negative, BAL less than 15.  QTc 429.  Patient is seen for evaluation and during treatment team.  States that he has been off his medications for the last 2 to 3 weeks, since then he has also been experiencing suicidal ideations.  He denies having a plan since he has been experiencing suicidal ideations.  Does feel as if his medications he was stabilized on were working at the time.  Has been unable to get transport to go to his outpatient mental health appointment.  He reports increased anxiety, stating that " I feel like I am always scared of someone", unable to elaborate on these feelings.  Reports decreased sleep, about 2 to 3 hours at night.  Appetite is decreased.  Endorses anhedonia, depressed mood, difficulties concentrating.  He is also endorsing anxiety, excessive worry.  Reports that he been buying Suboxone  off the street, he gets 1 8-2 mg [place] which left him a week.  Reports last  use was yesterday prior to presenting to the ED.  Currently reports withdrawal symptoms including myalgias, headache, fatigue.  He denies any other drug use, alcohol use.  Today he is denying suicidal ideations, stating he feels safe in the hospital.  Denies HI and AVH.  Alert and oriented to person place and time.  Affect is blunt, insight and judgment fair.   Associated Signs/Symptoms: Depression Symptoms:  depressed mood, fatigue, difficulty concentrating, suicidal thoughts without plan, anxiety, loss of energy/fatigue, disturbed sleep, decreased appetite, (Hypo) Manic Symptoms:  none  Anxiety Symptoms:  Excessive Worry, Psychotic Symptoms:  none  PTSD Symptoms: NA Total Time spent with patient: 1 hour  Past Psychiatric History:  Information collected from patient and chart review   Prev Dx/Sx: depression, polysubstance abuse Current Psych Provider: none Home Meds (current): none x 2week  Previous Med Trials: Clonidine , Lexapro , Trazodone , Seroquel   Therapy: denies   Prior Psych Hospitalization: yes, most recently [facility] unit 12/2023 Prior Self Harm: denies Prior Violence: denies   Family Psych History: denies Family Hx suicide: denies   Social History:  Developmental Hx: denies Educational Hx: 9th  Occupational Hx: denies Legal Hx: he denies Living Situation: living at a rooming house in [place], [place].  States the owner has allowed him to stay rent free.   Spiritual Hx: Christian Access to weapons/lethal means: denies    Substance History Alcohol: denies  Tobacco: smokes 1/2 pack of cigarettes daily Illicit drugs: denies Prescription drug abuse: denies Rehab hx: yes, cannot recall dates  Is the patient at risk to self? Yes.    Has the patient been a risk to self in the past 6 months?  Yes.    Has the patient been a risk to self within the distant past? Yes.    Is the patient a risk to others? No.  Has the patient been a risk to others in the past 6  months? No.  Has the patient been a risk to others within the distant past? No.   [place] Scale:  Flowsheet Row Admission (Current) from 04/23/2024 in [facility] INPATIENT BEHAV[facility]AL MEDICINE ED from 04/22/2024 in [facility] Emergency Department at [facility] Admission (Discharged) from 12/27/2023 in [facility] INPATIENT BEHAVIORAL MEDICINE  C-SSRS RISK CATEGORY Low Risk High Risk High Risk        Prior Inpatient Therapy: Yes.   If yes, describe see above   Prior Outpatient Therapy: No.   Alcohol Screening: 1. How often do you have a drink containing alcohol?: Never 2. How many drinks containing alcohol do you have on a typical day when you are drinking?: 1 or 2 3. How often do you have six or more drinks on one occasion?: Never AUDIT-C Score: 0 Alcohol Brief Interventions/Follow-up: Alcohol education/Brief advice Substance Abuse History in the last 12 months:  Yes.   Consequences of Substance Abuse: NA Previous Psychotropic Medications: Yes  Psychological Evaluations: No  Past Medical History:  Past Medical History:  Diagnosis Date   Chronic cough    Urolithiasis    Urolithiasis     Past Surgical History:  Procedure Laterality Date   CATARACT EXTRACTION W/PHACO Left 08/09/2021   Procedure: CATARACT EXTRACTION PHACO AND INTRAOCULAR LENS PLACEMENT (IOC) LEFT .051 00:10.0;  Surgeon: Annell Kidney, MD;  Location: [facility];  Service: Ophthalmology;  Laterality: Left;   CATARACT EXTRACTION W/PHACO Right 10/04/2021   Procedure: CATARACT EXTRACTION PHACO AND INTRAOCULAR LENS PLACEMENT (IOC) RIGHT 0.67 00:13.8;  Surgeon: Annell Kidney, MD;  Location: [facility];  Service: Ophthalmology;  Laterality: Right;  general anesthesia needs to be last case   HERNIA REPAIR     x2   MANDIBLE FRACTURE SURGERY     Family History: History reviewed. No pertinent family history. Family Psychiatric  History: see above  Tobacco Screening:  Social History   Tobacco Use   Smoking Status Every Day   Current packs/day: 1.00   Average packs/day: 1 pack/day for 35.0 years (35.0 ttl pk-yrs)   Types: Cigarettes  Smokeless Tobacco Never  Tobacco Comments   Started smoking age 28    BH Tobacco Counseling     Are you interested in Tobacco Cessation Medications?  No value filed. Counseled patient on smoking cessation:  No value filed. Reason Tobacco Screening Not Completed: No value filed.       Social History:  Social History   Substance and Sexual Activity  Alcohol Use No     Social History   Substance and Sexual Activity  Drug Use Not Currently    Additional Social History: Marital status: Single Are you sexually active?: No What is your sexual orientation?: "heterosexual." Has your sexual activity been affected by drugs, alcohol, medication, or emotional stress?: "Ain't been interested lately for some reason." Does patient have children?: Yes How many children?: 3 How is patient's relationship with their children?: "It's ok when I talk to them but they don't ever call me."                         Allergies:  No Known Allergies Lab Results:  Results for orders placed or performed during the hospital encounter of 04/22/24 (from the  past 48 hours)  Urine Drug Screen, Qualitative     Status: None   Collection Time: 04/22/24  9:55 PM  Result Value Ref Range   Tricyclic, Ur Screen NONE DETECTED NONE DETECTED   Amphetamines, Ur Screen NONE DETECTED NONE DETECTED   MDMA (Ecstasy)Ur Screen NONE DETECTED NONE DETECTED   Cocaine Metabolite,Ur [place] NONE DETECTED NONE DETECTED   Opiate, Ur Screen NONE DETECTED NONE DETECTED   Phencyclidine (PCP) Ur S NONE DETECTED NONE DETECTED   Cannabinoid 50 Ng, Ur  NONE DETECTED NONE DETECTED   Barbiturates, Ur Screen NONE DETECTED NONE DETECTED   Benzodiazepine, Ur Scrn NONE DETECTED NONE DETECTED   Methadone Scn, Ur NONE DETECTED NONE DETECTED    Comment: (NOTE) Tricyclics + metabolites, urine     Cutoff 1000 ng/mL Amphetamines + metabolites, urine  Cutoff 1000 ng/mL MDMA (Ecstasy), urine              Cutoff 500 ng/mL Cocaine Metabolite, urine          Cutoff 300 ng/mL Opiate + metabolites, urine        Cutoff 300 ng/mL Phencyclidine (PCP), urine         Cutoff 25 ng/mL Cannabinoid, urine                 Cutoff 50 ng/mL Barbiturates + metabolites, urine  Cutoff 200 ng/mL Benzodiazepine, urine              Cutoff 200 ng/mL Methadone, urine                   Cutoff 300 ng/mL  The urine drug screen provides only a preliminary, unconfirmed analytical test result and should not be used for non-medical purposes. Clinical consideration and professional judgment should be applied to any positive drug screen result due to possible interfering substances. A more specific alternate chemical method must be used in order to obtain a confirmed analytical result. Gas chromatography / mass spectrometry (GC/MS) is the preferred confirm atory method. Performed at [facility], [address] [place], [place] [address]   Comprehensive metabolic panel     Status: Abnormal   Collection Time: 04/22/24  9:56 PM  Result Value Ref Range   Sodium 142 135 - 145 mmol/L   Potassium 3.9 3.5 - 5.1 mmol/L   Chloride 100 98 - 111 mmol/L   CO2 33 (H) 22 - 32 mmol/L   Glucose, Bld 94 70 - 99 mg/dL    Comment: Glucose reference range applies only to samples taken after fasting for at least 8 hours.   BUN 18 6 - 20 mg/dL   Creatinine, Ser 9.14 0.61 - 1.24 mg/dL   Calcium 9.0 8.9 - 78.2 mg/dL   Total Protein 6.9 6.5 - 8.1 g/dL   Albumin 4.4 3.5 - 5.0 g/dL   AST 17 15 - 41 U/L   ALT 15 0 - 44 U/L   Alkaline Phosphatase 61 38 - 126 U/L   Total Bilirubin 0.4 0.0 - 1.2 mg/dL   GFR, Estimated >95 >62 mL/min    Comment: (NOTE) Calculated using the CKD-EPI Creatinine Equation (2021)    Anion gap 9 5 - 15    Comment: Performed at [facility], [address] [place], [place] [address]   Ethanol     Status: None   Collection Time: 04/22/24  9:56 PM  Result Value Ref Range   Alcohol, Ethyl (B) <15 <15 mg/dL    Comment: Please note change in  reference range. (NOTE) For medical purposes only. Performed at [facility], [address] [place], [place] [address]   cbc     Status: None   Collection Time: 04/22/24  9:56 PM  Result Value Ref Range   WBC 6.8 4.0 - 10.5 K/uL   RBC 5.34 4.22 - 5.81 MIL/uL   Hemoglobin 16.4 13.0 - 17.0 g/dL   HCT 60.4 54.0 - 98.1 %   MCV 92.9 80.0 - 100.0 fL   MCH 30.7 26.0 - 34.0 pg   MCHC 33.1 30.0 - 36.0 g/dL   RDW 19.1 47.8 - 29.5 %   Platelets 298 150 - 400 K/uL   nRBC 0.0 0.0 - 0.2 %    Comment: Performed at [facility], [address] [place], [place] [address]    Blood Alcohol level:  Lab Results  Component Value Date   [facility] <15 04/22/2024   ETH <10 12/27/2023    Metabolic Disorder Labs:  Lab Results  Component Value Date   HGBA1C 6.1 (H) 12/29/2023   MPG 128.37 12/29/2023   No results found for: "PROLACTIN" Lab Results  Component Value Date   CHOL 155 12/29/2023   TRIG 114 12/29/2023   HDL 31 (L) 12/29/2023   CHOLHDL 5.0 12/29/2023   VLDL 23 12/29/2023   LDLCALC 101 (H) 12/29/2023    Current Medications: Current Facility-Administered Medications  Medication Dose Route Frequency Provider Last Rate Last Admin   acetaminophen  (TYLENOL ) tablet 650 mg  650 mg Oral Q6H PRN Elmira Olkowski, PA-C   650 mg at 04/24/24 1537   albuterol  (VENTOLIN  HFA) 108 (90 Base) MCG/ACT inhaler 2 puff  2 puff Inhalation Q6H PRN Makaylie Dedeaux, PA-C       alum & mag hydroxide-simeth (MAALOX/MYLANTA) 200-200-20 MG/5ML suspension 30 mL  30 mL Oral Q4H PRN Kamari Buch, PA-C       cloNIDine  (CATAPRES ) tablet 0.1 mg  0.1 mg Oral QID Mahathi Pokorney, PA-C       Followed by   Cecily Cohen ON 04/27/2024] cloNIDine  (CATAPRES ) tablet 0.1 mg  0.1 mg Oral BH-qamhs Seville Brick, PA-C       Followed by    Cecily Cohen ON 04/29/2024] cloNIDine  (CATAPRES ) tablet 0.1 mg  0.1 mg Oral QAC breakfast Kmari Brian, PA-C       dicyclomine  (BENTYL ) tablet 20 mg  20 mg Oral Q6H PRN Shenice Dolder, PA-C       haloperidol  (HALDOL ) tablet 5 mg  5 mg Oral TID PRN Eustacio Ellen, PA-C       And   diphenhydrAMINE  (BENADRYL ) capsule 50 mg  50 mg Oral TID PRN Kaleb Linquist, PA-C       haloperidol  lactate (HALDOL ) injection 5 mg  5 mg Intramuscular TID PRN Sakura Denis, PA-C       And   diphenhydrAMINE  (BENADRYL ) injection 50 mg  50 mg Intramuscular TID PRN Anushri Casalino, PA-C       And   LORazepam  (ATIVAN ) injection 2 mg  2 mg Intramuscular TID PRN Stephnie Parlier, PA-C       haloperidol  lactate (HALDOL ) injection 10 mg  10 mg Intramuscular TID PRN Harjit Leider, PA-C       And   diphenhydrAMINE  (BENADRYL ) injection 50 mg  50 mg Intramuscular TID PRN Correll Denbow, PA-C       And   LORazepam  (ATIVAN ) injection 2 mg  2 mg Intramuscular TID PRN Denea Cheaney, PA-C       escitalopram  (LEXAPRO ) tablet 10 mg  10 mg  Oral Daily Dezeray Puccio, PA-C   10 mg at 04/24/24 3244   feeding supplement (BOOST / RESOURCE BREEZE) liquid 1 Container  1 Container Oral TID BM Koralyn Prestage, PA-C       hydrOXYzine  (ATARAX ) tablet 25 mg  25 mg Oral Q6H PRN Marlos Carmen, PA-C       loperamide  (IMODIUM ) capsule 2-4 mg  2-4 mg Oral PRN Haider Hornaday, PA-C       magnesium  hydroxide (MILK OF MAGNESIA) suspension 30 mL  30 mL Oral Daily PRN Keundra Petrucelli, PA-C       methocarbamol  (ROBAXIN ) tablet 500 mg  500 mg Oral Q8H PRN Lyndell Gillyard, PA-C       multivitamin with minerals tablet 1 tablet  1 tablet Oral Daily Claris Guymon, PA-C       naproxen  (NAPROSYN ) tablet 500 mg  500 mg Oral BID PRN Analucia Hush, PA-C       nicotine  (NICODERM CQ  - dosed in mg/24 hours) patch 14 mg  14 mg Transdermal Daily Linnette Panella, PA-C   14 mg at 04/24/24  0102   ondansetron  (ZOFRAN -ODT) disintegrating tablet 4 mg  4 mg Oral Q6H PRN Endy Easterly, PA-C       QUEtiapine  (SEROQUEL ) tablet 50 mg  50 mg Oral QHS Shilynn Hoch, PA-C   50 mg at 04/23/24 2115   PTA Medications: Medications Prior to Admission  Medication Sig Dispense Refill Last Dose/Taking   albuterol  (VENTOLIN  HFA) 108 (90 Base) MCG/ACT inhaler Inhale 2 puffs into the lungs every 6 (six) hours as needed for wheezing or shortness of breath. 8 g 2    albuterol  (VENTOLIN  HFA) 108 (90 Base) MCG/ACT inhaler Inhale 2 puffs into the lungs every 6 (six) hours. 1530 g 0    cloNIDine  (CATAPRES ) 0.1 MG tablet Take 1 tablet (0.1 mg total) by mouth 2 (two) times daily. 60 tablet 11    escitalopram  (LEXAPRO ) 20 MG tablet Take 1 tablet (20 mg total) by mouth daily for 3 days. 3 tablet 0    mometasone -formoterol  (DULERA ) 100-5 MCG/ACT AERO Inhale 2 puffs into the lungs 2 (two) times daily. 39 g 1    mometasone -formoterol  (DULERA ) 100-5 MCG/ACT AERO Inhale 2 puffs into the lungs 2 (two) times daily. 1 each 0    QUEtiapine  (SEROQUEL ) 50 MG tablet Take 1 tablet (50 mg total) by mouth at bedtime. 30 tablet 0    traZODone  (DESYREL ) 100 MG tablet Take 1 tablet (100 mg total) by mouth at bedtime. 30 tablet 0     Musculoskeletal: Strength & Muscle Tone: within normal limits Gait & Station: normal Patient leans: N/A            Psychiatric Specialty Exam:  Presentation  General Appearance:  Disheveled  Eye Contact: Fair  Speech: Clear and Coherent; Normal Rate  Speech Volume: Normal  Handedness: Right   Mood and Affect  Mood: Dysphoric; Depressed; Anxious  Affect: Blunt   Thought Process  Thought Processes: Coherent; Goal Directed  Duration of Psychotic Symptoms:N/A Past Diagnosis of Schizophrenia or Psychoactive disorder: No  Descriptions of Associations:Intact  Orientation:Full (Time, Place and Person)  Thought  Content:WDL  Hallucinations:Hallucinations: None  Ideas of Reference:None  Suicidal Thoughts:Suicidal Thoughts: No  Homicidal Thoughts:Homicidal Thoughts: No   Sensorium  Memory: Immediate Good; Recent Good  Judgment: Fair  Insight: Fair   Executive Functions  Concentration: Good  Attention Span: Good  Recall: Good  Fund of Knowledge: Fair  Language: Fair   Psychomotor Activity  Psychomotor Activity:Psychomotor Activity:  Normal   Assets  Assets: Manufacturing systems engineer; Desire for Improvement; Leisure Time; Physical Health   Sleep  Sleep:Sleep: Poor    Physical Exam: Physical Exam Vitals and nursing note reviewed.  Eyes:     Extraocular Movements: Extraocular movements intact.  Pulmonary:     Effort: Pulmonary effort is normal.  Neurological:     Mental Status: He is alert and oriented to person, place, and time.  Psychiatric:        Attention and Perception: Attention and perception normal.        Mood and Affect: Mood is anxious and depressed.        Speech: Speech normal.        Behavior: Behavior is withdrawn. Behavior is cooperative.        Thought Content: Thought content normal.        Cognition and Memory: Cognition and memory normal.    Review of Systems  Musculoskeletal:  Positive for myalgias.  Neurological:  Positive for headaches.  Psychiatric/Behavioral:  Positive for depression and substance abuse. The patient is nervous/anxious and has insomnia.   All other systems reviewed and are negative.  Blood pressure (!) 128/96, pulse 70, temperature 97.6 F (36.4 C), temperature source Temporal, resp. rate 16, height 5\' 4"  (1.626 m), weight 49.9 kg, SpO2 95%. Body mass index is 18.88 kg/m.  Treatment Plan Summary: Daily contact with patient to assess and evaluate symptoms and progress in treatment and Medication management  Observation Level/Precautions:  15 minute checks  Laboratory:  Folic Acid  Vitamin B-12  Psychotherapy:     Medications:    Consultations:    Discharge Concerns:    Estimated LOS:  Other:     Physician Treatment Plan for Primary Diagnosis: MDD (major depressive disorder), recurrent episode, severe (HCC) 1.    Safety and Monitoring:   --  Voluntary admission to inpatient psychiatric unit for safety, stabilization and treatment -- Daily contact with patient to assess and evaluate symptoms and progress in treatment -- Patient's case to be discussed in multi-disciplinary team meeting -- Observation Level : q15 minute checks -- Vital signs:  q12 hours -- Precautions: suicide   2. Psychiatric Diagnoses and Treatment: Patient with a history of noncompliance with medications, was recently admitted to [facility] inpatient [facility] unit in January 2025, stabilized at the time.  Had similar complaints at this time.  This time presented with suicidal ideation with plans to hang himself, has been using Suboxone  daily buying off the street, continues to endorse depressed mood, anxiety.  Currently reporting withdrawal symptoms.  We will continue to monitor for further treatment and stabilization.  Recommend substance use treatment program at discharge.   -- Start clonidine  detox protocol, COWS protocol -- Continue Lexapro  10 mg daily for MDD/anxiety -- Continue Seroquel  50 mg at bedtime for sleep  --  The risks/benefits/side-effects/alternatives to this medication were discussed in detail with the patient and time was given for questions. The patient consents to medication trial. -- Metabolic profile and EKG monitoring obtained while on an atypical antipsychotic  -- Encouraged patient to participate in unit milieu and in scheduled group therapies -- Short Term Goals: Ability to identify changes in lifestyle to reduce recurrence of condition will improve, Ability to verbalize feelings will improve, Ability to disclose and discuss suicidal ideas, Ability to demonstrate self-control will improve, Ability to identify and  develop effective coping behaviors will improve, Ability to maintain clinical measurements within normal limits will improve, Compliance with prescribed medications will improve, and Ability  to identify triggers associated with substance abuse/mental health issues will improve -- Long Term Goals: Improvement in symptoms so as ready for discharge        3. Medical Issues Being Addressed:               COPD, restart home meds    4. Discharge Planning:   -- Social work and case management to assist with discharge planning and identification of hospital follow-up needs prior to discharge -- Estimated LOS: 5-7 days -- Discharge Concerns: Need to establish a safety plan; Medication compliance and effectiveness -- Discharge Goals: Return home with outpatient referrals for mental health follow-up including medication management/psychotherapy   I certify that inpatient services furnished can reasonably be expected to improve the patient's condition.    Celene Pippins, PA-C 5/9/20255:10 PM

## 2024-04-24 NOTE — [facility] Counselor (Signed)
 Adult Comprehensive Assessment  Patient ID: Halen Kilby., male   DOB: 01/15/1966, 58 y.o.   MRN: [id number]  Information Source: Information source: Patient  Current Stressors:  Patient states their primary concerns and needs for treatment are:: "Depression, nerves and anxiety." Patient states their goals for this hospitilization and ongoing recovery are:: "To try to get better." Educational / Learning stressors: Patient denies. Employment / Job issues: "I ain't got no job right now and I'm trying to find one. I don't have a car." Family Relationships: Patient denies. Financial / Lack of resources (include bankruptcy): "Yes. I ain't got no job." Housing / Lack of housing: "Yes, I'm going to be honest. I don't have no job to pay my rent." Physical health (include injuries & life threatening diseases): Patient denies. Social relationships: "No." Substance abuse: "I use suboxine." Bereavement / Loss: "My momma 3 years ago."  Living/Environment/Situation:  Living Arrangements: Alone Living conditions (as described by patient or guardian): WNL Who else lives in the home?: "I live in a rooming house but I've been there two months with no rent." How long has patient lived in current situation?: "A few months." What is atmosphere in current home: Comfortable  Family History:  Marital status: Single Are you sexually active?: No What is your sexual orientation?: "heterosexual." Has your sexual activity been affected by drugs, alcohol, medication, or emotional stress?: "Ain't been interested lately for some reason." Does patient have children?: Yes How many children?: 3 How is patient's relationship with their children?: "It's ok when I talk to them but they don't ever call me."  Childhood History:  By whom was/is the patient raised?: Mother, Father Description of patient's relationship with caregiver when they were a child: "It was ok." Patient's description of current relationship  with people who raised him/her: "They both been dead." How were you disciplined when you got in trouble as a child/adolescent?: "Grouonded or get my things taken." Does patient have siblings?: Yes Number of Siblings: 3 Description of patient's current relationship with siblings: "It's alright I reckon. They have their own lives like everybody else." Did patient suffer any verbal/emotional/physical/sexual abuse as a child?: No Did patient suffer from severe childhood neglect?: No Has patient ever been sexually abused/assaulted/raped as an adolescent or adult?: No Was the patient ever a victim of a crime or a disaster?: No Witnessed domestic violence?: No Has patient been affected by domestic violence as an adult?: No  Education:  Highest grade of school patient has completed: "9th grade." Currently a student?: No Learning disability?: No  Employment/Work Situation:   Employment Situation: Unemployed Patient's Job has Been Impacted by Current Illness: No Describe how Patient's Job has Been Impacted: None reported. 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?: No  Financial Resources:   Surveyor, quantity resources: [employer], Food stamps Does patient have a Lawyer or guardian?: No  Alcohol/Substance Abuse:   What has been your use of drugs/alcohol within the last 12 months?: Patient denies. If attempted suicide, did drugs/alcohol play a role in this?: No Alcohol/Substance Abuse Treatment Hx: Denies past history Has alcohol/substance abuse ever caused legal problems?: No  Social Support System:   Patient's Community Support System: None Describe Community Support System: 'I don't have anyone to support me." Type of faith/religion: Patient denies. How does patient's faith help to cope with current illness?: Paitient denies.  Leisure/Recreation:  Do You Have  Hobbies?: No  Strengths/Needs:   What is the patient's perception of their strengths?: "I don't have any strength. I'm withdrawing." Patient states they can use these personal strengths during their treatment to contribute to their recovery: "I'm not sure." Patient states these barriers may affect/interfere with their treatment: "I don't know." Patient states these barriers may affect their return to the community: Patient reports challenging dynamics with his housing due to lack of income. Other important information patient would like considered in planning for their treatment: None reported.  Discharge Plan:   Currently receiving community mental health services: No Patient states concerns and preferences for aftercare planning are: Housing and lack of income. Patient states they will know when they are safe and ready for discharge when: "I'll feel it." Does patient have access to transportation?: No Does patient have financial barriers related to discharge medications?: No Patient description of barriers related to discharge medications: Patient reports no documented income. Plan for no access to transportation at discharge: [name] to arrange with transportation as needed at discharge. Will patient be returning to same living situation after discharge?: Yes  Summary/Recommendations:   Summary and Recommendations (to be completed by the evaluator): Patient is a 58 year old male from [place], [place] [facility] [place]) who presented voluntarily in no acute distress for treatment of SI and medication management. Patient reports employment, financial and housing stressors. Patient reports lack of income due to employment and difficulty finding employment due to not having a vehicle. Patient is currently residing  in a rooming house and described the atmosphere as "comfortable." Patient reports that he hasn't paid rent in a "few months" of living in the home. Patient reports that he is having "a lot on  his mind" due to his housing situation. Patient has no documented income but reports receiving Medicaid and EBT. Patient reports that he does not receive adequate support. Patient repots "I don't have anyone supporting me." Patient reports that his mother passed three years ago but did not share any concerns with complicated grief at this time. Patient denies substance use but reports that he is "withdrawing." Patient denies SI/HI and AVH at this moment. Patient is not currently with an outpatient mental health provider but is open to a referral prior to discharge. Recommendations include: crisis stabilization, therapeutic milieu, encourage group attendance and participation, medication management for mood stabilization and development of comprehensive mental wellness/sobriety plan.  Amelya Mabry M Bralynn Velador. 04/24/2024

## 2024-04-24 NOTE — Progress Notes (Signed)
   04/24/24 1700  Psych Admission Type (Psych Patients Only)  Admission Status Voluntary  Psychosocial Assessment  Patient Complaints Sleep disturbance;Depression;Anxiety;Self-harm thoughts (patient stated, "I didnt sleep good due to withdrawals.  My anx/dep 10/10.  I feel scared all the time but I dont know of what.  Its like fear.")  Eye Contact Fair  Facial Expression Sad  Affect Sad  Speech Soft  Interaction Assertive  Motor Activity Slow  Appearance/Hygiene In scrubs  Behavior Characteristics Cooperative;Calm  Mood Depressed;Pleasant  Thought Process  Coherency WDL  Content Blaming self  Delusions WDL  Perception WDL  Hallucination None reported or observed  Judgment Impaired  Confusion WDL  Danger to Self  Current suicidal ideation? Passive (patient stated, "I just think about it; it goes through my mind.")  Self-Injurious Behavior Some self-injurious ideation observed or expressed.  No lethal plan expressed   Agreement Not to Harm Self Yes  Description of Agreement verbal  Danger to Others  Danger to Others None reported or observed

## 2024-04-24 NOTE — Progress Notes (Signed)
   04/24/24 1600  Clinical Opiate Withdrawal Scale (COWS)  Resting Pulse Rate 1  Sweating 0  Restlessness 0  Pupil Size 0  Bone or Joint Aches 2  Runny Nose or Tearing 0  GI Upset 0  Tremor 0  Yawning 0  Anxiety or Irritability 1  Gooseflesh Skin 0  COWS Total Score 4

## 2024-04-25 DIAGNOSIS — F332 Major depressive disorder, recurrent severe without psychotic features: Secondary | ICD-10-CM | POA: Diagnosis not present

## 2024-04-25 NOTE — Plan of Care (Signed)
  Problem: Education: Goal: Emotional status will improve Outcome: Progressing   Problem: Education: Goal: Mental status will improve Outcome: Progressing   Problem: Education: Goal: Verbalization of understanding the information provided will improve Outcome: Progressing   

## 2024-04-25 NOTE — Group Note (Signed)
 Date:  04/25/2024 Time:  6:25 PM  Group Topic/Focus:  Making Healthy Choices:   The focus of this group is to help patients identify negative/unhealthy choices they were using prior to admission and identify positive/healthier coping strategies to replace them upon discharge.    Participation Level:  Did Not Attend   Mellie Sprinkle [facility] 04/25/2024, 6:25 PM

## 2024-04-25 NOTE — Progress Notes (Signed)
 Patient presents with flat affect and reporting overall malaise (headache,weakness, anxiety) due to withdrawal. VS WDL. Cows score 3. Patient compliant with all medications. Patient currently denies SI,HI, and A/V/H with no plan or intent. Patient given tylenol  and hydroxyzine  po prn due to headache and anxiety. Patient remains mostly in room resting throughout day but demonstrates adequate appetite and comes out of room for meals. Patient remains cooperative in unit at this time.

## 2024-04-25 NOTE — Progress Notes (Signed)
 [facility] MD Progress Note  04/25/2024 5:44 PM Corey House.  MRN:  [id number]  58 year old Caucasian male with reported history of MDD, opioid use disorder, noncompliance with medication regimen voluntarily presented to [facility] ED on 5/7 with chief complaints of no desire to live anymore, feeling anxious and shaky all the time. Reported depressed mood, lack of motivation, impaired sleep and appetite for several months, having difficulty finding employment, endorsed suicidal ideation with thoughts of hanging himself. Was admitted here at [facility] unit in January 2025, for similar complaints, was stabilized on Lexapro , Seroquel , clonidine  and trazodone . UDS negative, BAL less than 15. QTc 429.    Subjective: Patient's case discussed with multidisciplinary team, all vitals and notes reviewed.  No reported behavioral issues overnight.  Patient is seen for reassessment, he reports good sleep and appetite.  States that his depression and anxiety are improving.  Continues to complain of fatigue, body aches.  COWS score was only a 3.  Received as needed Tylenol  for headache.  He denies SI/HI and AVH.  Denies any medication side effects.  He currently interested in inpatient substance use program.  Patient is withdrawn to his room.  Affect is blunt.  Insight and judgment fair. EKG NSR QTc 436.  Principal Problem: MDD (major depressive disorder), recurrent episode, severe (HCC) Diagnosis: Principal Problem:   MDD (major depressive disorder), recurrent episode, severe (HCC)  Total Time spent with patient: 20 minutes  Past Psychiatric History: see H&P  Past Medical History:  Past Medical History:  Diagnosis Date   Chronic cough    Urolithiasis    Urolithiasis     Past Surgical History:  Procedure Laterality Date   CATARACT EXTRACTION W/PHACO Left 08/09/2021   Procedure: CATARACT EXTRACTION PHACO AND INTRAOCULAR LENS PLACEMENT (IOC) LEFT .051 00:10.0;  Surgeon: Annell Kidney, MD;   Location[facility];  Service: Ophthalmology;  Laterality: Left;   CATARACT EXTRACTION W/PHACO Right 10/04/2021   Procedure: CATARACT EXTRACTION PHACO AND INTRAOCULAR LENS PLACEMENT (IOC) RIGHT 0.67 00:13.8;  Surgeon: Annell Kidney, MD;  Location: [facility];  Service: Ophthalmology;  Laterality: Right;  general anesthesia needs to be last case   HERNIA REPAIR     x2   MANDIBLE FRACTURE SURGERY     Family History: History reviewed. No pertinent family history. Family Psychiatric  History: see H&P Social History:  Social History   Substance and Sexual Activity  Alcohol Use No     Social History   Substance and Sexual Activity  Drug Use Not Currently    Social History   Socioeconomic History   Marital status: Single    Spouse name: Not on file   Number of children: Not on file   Years of education: Not on file   Highest education level: Not on file  Occupational History   Not on file  Tobacco Use   Smoking status: Every Day    Current packs/day: 1.00    Average packs/day: 1 pack/day for 35.0 years (35.0 ttl pk-yrs)    Types: Cigarettes   Smokeless tobacco: Never   Tobacco comments:    Started smoking age 77  Vaping Use   Vaping status: Never Used  Substance and Sexual Activity   Alcohol use: No   Drug use: Not Currently   Sexual activity: Not on file  Other Topics Concern   Not on file  Social History Narrative   Not on file   Social Drivers of Health   Financial Resource Strain: Not on  file  Food Insecurity: Food Insecurity Present (04/23/2024)   Hunger Vital Sign    Worried About Running Out of Food in the Last Year: Sometimes true    Ran Out of Food in the Last Year: Often true  Transportation Needs: Unmet Transportation Needs (04/23/2024)   PRAPARE - Administrator, Civil Service (Medical): Yes    Lack of Transportation (Non-Medical): Yes  Physical Activity: Not on file  Stress: Not on file  Social Connections: Unknown  (04/23/2024)   Social Connection and Isolation Panel [NHANES]    Frequency of Communication with Friends and Family: Twice a week    Frequency of Social Gatherings with Friends and Family: Patient declined    Attends Religious Services: Patient declined    Database administrator or Organizations: Patient declined    Attends Engineer, structural: Patient declined    Marital Status: Patient declined   Additional Social History:                         Sleep: Good  Appetite:  Good  Current Medications: Current Facility-Administered Medications  Medication Dose Route Frequency Provider Last Rate Last Admin   acetaminophen  (TYLENOL ) tablet 650 mg  650 mg Oral Q6H PRN Filemon Breton, PA-C   650 mg at 04/25/24 0843   albuterol  (VENTOLIN  HFA) 108 (90 Base) MCG/ACT inhaler 2 puff  2 puff Inhalation Q6H PRN Gael Delude, Ariyan Brisendine, PA-C       alum & mag hydroxide-simeth (MAALOX/MYLANTA) 200-200-20 MG/5ML suspension 30 mL  30 mL Oral Q4H PRN Saivon Prowse, PA-C       cloNIDine  (CATAPRES ) tablet 0.1 mg  0.1 mg Oral QID Mima Cranmore, PA-C   0.1 mg at 04/25/24 1737   Followed by   Cecily Cohen ON 04/27/2024] cloNIDine  (CATAPRES ) tablet 0.1 mg  0.1 mg Oral BH-qamhs Alexiz Sustaita, PA-C       Followed by   Cecily Cohen ON 04/29/2024] cloNIDine  (CATAPRES ) tablet 0.1 mg  0.1 mg Oral QAC breakfast Imanie Darrow, PA-C       dicyclomine  (BENTYL ) tablet 20 mg  20 mg Oral Q6H PRN Dylen Mcelhannon, , PA-C       haloperidol  (HALDOL ) tablet 5 mg  5 mg Oral TID PRN Rondrick Barreira, PA-C       And   diphenhydrAMINE  (BENADRYL ) capsule 50 mg  50 mg Oral TID PRN Chancey Ringel, PA-C       haloperidol  lactate (HALDOL ) injection 5 mg  5 mg Intramuscular TID PRN Bobbye Reinitz, PA-C       And   diphenhydrAMINE  (BENADRYL ) injection 50 mg  50 mg Intramuscular TID PRN Korey Prashad, PA-C       And   LORazepam  (ATIVAN ) injection 2 mg  2 mg Intramuscular TID PRN ,  , PA-C       haloperidol  lactate (HALDOL ) injection 10 mg  10 mg Intramuscular TID PRN Priyansh Pry, PA-C       And   diphenhydrAMINE  (BENADRYL ) injection 50 mg  50 mg Intramuscular TID PRN Skyra Crichlow, PA-C       And   LORazepam  (ATIVAN ) injection 2 mg  2 mg Intramuscular TID PRN Rustyn Conery, PA-C       escitalopram  (LEXAPRO ) tablet 10 mg  10 mg Oral Daily Dorella Laster, PA-C   10 mg at 04/25/24 0843   feeding supplement (BOOST / RESOURCE BREEZE) liquid 1 Container  1 Container Oral TID BM , Trevor Fudge, PA-C  hydrOXYzine  (ATARAX ) tablet 25 mg  25 mg Oral Q6H PRN Elmer Merwin, PA-C   25 mg at 04/25/24 1601   loperamide  (IMODIUM ) capsule 2-4 mg  2-4 mg Oral PRN Oluwaferanmi Wain, PA-C       magnesium  hydroxide (MILK OF MAGNESIA) suspension 30 mL  30 mL Oral Daily PRN Jenniefer Salak, PA-C       methocarbamol  (ROBAXIN ) tablet 500 mg  500 mg Oral Q8H PRN Kadynce Bonds, PA-C       multivitamin with minerals tablet 1 tablet  1 tablet Oral Daily Brentt Fread, PA-C   1 tablet at 04/25/24 8119   naproxen  (NAPROSYN ) tablet 500 mg  500 mg Oral BID PRN Chanay Nugent, PA-C       nicotine  (NICODERM CQ  - dosed in mg/24 hours) patch 14 mg  14 mg Transdermal Daily Manahil Vanzile, PA-C   14 mg at 04/25/24 0844   ondansetron  (ZOFRAN -ODT) disintegrating tablet 4 mg  4 mg Oral Q6H PRN Shakeita Vandevander, PA-C       QUEtiapine  (SEROQUEL ) tablet 50 mg  50 mg Oral QHS Gabbriella Presswood, PA-C   50 mg at 04/24/24 2124    Lab Results: No results found for this or any previous visit (from the past 48 hours).  Blood Alcohol level:  Lab Results  Component Value Date   [facility] <15 04/22/2024   ETH <10 12/27/2023    Metabolic Disorder Labs: Lab Results  Component Value Date   HGBA1C 6.1 (H) 12/29/2023   MPG 128.37 12/29/2023   No results found for: "PROLACTIN" Lab Results  Component Value Date   CHOL 155 12/29/2023   TRIG  114 12/29/2023   HDL 31 (L) 12/29/2023   CHOLHDL 5.0 12/29/2023   VLDL 23 12/29/2023   LDLCALC 101 (H) 12/29/2023    Physical Findings: AIMS:  , ,  ,  ,    CIWA:    COWS:  COWS Total Score: 3  Musculoskeletal: Strength & Muscle Tone: within normal limits Gait & Station: normal Patient leans: N/A  Psychiatric Specialty Exam:  Presentation  General Appearance:  Disheveled  Eye Contact: Fair  Speech: Clear and Coherent; Normal Rate  Speech Volume: Normal  Handedness: Right   Mood and Affect  Mood: Dysphoric; Depressed; Anxious  Affect: Blunt   Thought Process  Thought Processes: Coherent; Goal Directed  Descriptions of Associations:Intact  Orientation:Full (Time, Place and Person)  Thought Content:WDL  History of Schizophrenia/Schizoaffective disorder:No  Duration of Psychotic Symptoms:No data recorded Hallucinations:Hallucinations: None  Ideas of Reference:None  Suicidal Thoughts:Suicidal Thoughts: No  Homicidal Thoughts:Homicidal Thoughts: No   Sensorium  Memory: Immediate Good; Recent Good  Judgment: Fair  Insight: Fair   Art therapist  Concentration: Good  Attention Span: Good  Recall: Good  Fund of Knowledge: Fair  Language: Fair   Psychomotor Activity  Psychomotor Activity: Psychomotor Activity: Normal   Assets  Assets: Communication Skills; Desire for Improvement; Leisure Time; Physical Health   Sleep  Sleep: Sleep: Poor    Physical Exam: Physical Exam Vitals and nursing note reviewed.  Eyes:     Extraocular Movements: Extraocular movements intact.  Pulmonary:     Effort: Pulmonary effort is normal.  Neurological:     Mental Status: He is alert and oriented to person, place, and time.  Psychiatric:        Attention and Perception: Attention and perception normal.        Mood and Affect: Mood is anxious and depressed. Affect is blunt.  Speech: Speech normal.        Behavior:  Behavior is withdrawn. Behavior is cooperative.        Thought Content: Thought content normal.        Cognition and Memory: Cognition and memory normal.        Judgment: Judgment is impulsive.    Review of Systems  Constitutional:  Positive for malaise/fatigue.  Musculoskeletal:  Positive for myalgias.  Psychiatric/Behavioral:  Positive for depression. The patient is nervous/anxious.   All other systems reviewed and are negative.  Blood pressure 98/80, pulse 74, temperature 98.1 F (36.7 C), temperature source Temporal, resp. rate 16, height 5\' 4"  (1.626 m), weight 49.9 kg, SpO2 100%. Body mass index is 18.88 kg/m.   Treatment Plan Summary: MDD (major depressive disorder), recurrent episode, severe (HCC) 1.    Safety and Monitoring:   --  Voluntary admission to inpatient psychiatric unit for safety, stabilization and treatment -- Daily contact with patient to assess and evaluate symptoms and progress in treatment -- Patient's case to be discussed in multi-disciplinary team meeting -- Observation Level : q15 minute checks -- Vital signs:  q12 hours -- Precautions: suicide   2. Psychiatric Diagnoses and Treatment: Patient continues to slowly improve, reports improvement in symptoms of depression and anxiety.  Still reporting fatigue, body aches.  Most recent COWS score 3.  Denies SI/HI and AVH.  He is withdrawn to his room, med compliant.  We will continue to monitor for further treatment and stabilization.  Recommend substance use treatment program at discharge.   -- Continue clonidine  detox protocol, COWS protocol -- Continue Lexapro  10 mg daily for MDD/anxiety -- Continue Seroquel  50 mg at bedtime for sleep   --  The risks/benefits/side-effects/alternatives to this medication were discussed in detail with the patient and time was given for questions. The patient consents to medication trial. -- Metabolic profile and EKG monitoring obtained while on an atypical antipsychotic  --  Encouraged patient to participate in unit milieu and in scheduled group therapies -- Short Term Goals: Ability to identify changes in lifestyle to reduce recurrence of condition will improve, Ability to verbalize feelings will improve, Ability to disclose and discuss suicidal ideas, Ability to demonstrate self-control will improve, Ability to identify and develop effective coping behaviors will improve, Ability to maintain clinical measurements within normal limits will improve, Compliance with prescribed medications will improve, and Ability to identify triggers associated with substance abuse/mental health issues will improve -- Long Term Goals: Improvement in symptoms so as ready for discharge        3. Medical Issues Being Addressed:               COPD, restart home meds    4. Discharge Planning:   -- Social work and case management to assist with discharge planning and identification of hospital follow-up needs prior to discharge -- Estimated LOS: 5-7 days -- Discharge Concerns: Need to establish a safety plan; Medication compliance and effectiveness -- Discharge Goals: Return home with outpatient referrals for mental health follow-up including medication management/psychotherapy  Sheilda Deputy, PA-C 04/25/2024, 5:44 PM

## 2024-04-25 NOTE — Plan of Care (Signed)
  Problem: Health Behavior/Discharge Planning: Goal: Compliance with treatment plan for underlying cause of condition will improve Outcome: Progressing   Problem: Physical Regulation: Goal: Ability to maintain clinical measurements within normal limits will improve Outcome: Progressing   Problem: Safety: Goal: Periods of time without injury will increase Outcome: Progressing   

## 2024-04-25 NOTE — Group Note (Signed)
 LCSW Group Therapy Note   Group Date: 04/25/2024 Start Time: 1400 End Time: 1500   Type of Therapy and Topic:  Group Therapy: NA/AA   Participation Level:  Did Not Attend      Summary of Patient Progress:  The patient did not attend group.    Santina Cull, LCSWA 04/25/2024  3:28 PM

## 2024-04-25 NOTE — Progress Notes (Signed)
 Patient alert and oriented x 4, affect is flat but brightens upon approach, he denies SI/HI/AVH his thoughts are organized and coherent, interacting appropriately with peers and staff. 15 minutes safety checks maintained.

## 2024-04-25 NOTE — Group Note (Signed)
 Date:  04/25/2024 Time:  11:21 AM  Group Topic/Focus:  Goals Group:   The focus of this group is to help patients establish daily goals to achieve during treatment and discuss how the patient can incorporate goal setting into their daily lives to aide in recovery. Wellness Toolbox:   The focus of this group is to discuss various aspects of wellness, balancing those aspects and exploring ways to increase the ability to experience wellness.  Patients will create a wellness toolbox for use upon discharge.    Participation Level:  Did Not Attend   Mellie Sprinkle [facility] 04/25/2024, 11:21 AM

## 2024-04-26 DIAGNOSIS — F332 Major depressive disorder, recurrent severe without psychotic features: Secondary | ICD-10-CM | POA: Diagnosis not present

## 2024-04-26 NOTE — Progress Notes (Signed)
   04/25/24 2000  Psych Admission Type (Psych Patients Only)  Admission Status Voluntary  Psychosocial Assessment  Patient Complaints Sleep disturbance  Eye Contact Fair  Facial Expression Sad  Affect Sad  Speech Incoherent;Soft  Interaction Assertive  Motor Activity Slow  Appearance/Hygiene In scrubs  Behavior Characteristics Cooperative;Appropriate to situation  Mood Pleasant  Thought Process  Coherency WDL  Content Blaming others  Delusions WDL  Perception WDL  Hallucination None reported or observed  Judgment Impaired  Confusion WDL  Danger to Self  Current suicidal ideation? Denies  Agreement Not to Harm Self Yes  Description of Agreement verbal  Danger to Others  Danger to Others None reported or observed   Patient alert and oriented x 4, affect is blunted, thoughts are organized, he is interacting appropriately with peers and staff. 15 minutes safety checks maintained will continue to monitor.

## 2024-04-26 NOTE — Plan of Care (Signed)
  Problem: Education: Goal: Knowledge of [facility] Education information/materials will improve Outcome: Progressing   Problem: Education: Goal: Emotional status will improve Outcome: Progressing   Problem: Education: Goal: Mental status will improve Outcome: Progressing   

## 2024-04-26 NOTE — Group Note (Signed)
 Date:  04/26/2024 Time:  8:50 PM  Group Topic/Focus:  Wrap-Up Group:   The focus of this group is to help patients review their daily goal of treatment and discuss progress on daily workbooks.    Participation Level:  Did Not Attend   Corey House 04/26/2024, 8:50 PM

## 2024-04-26 NOTE — BHH Counselor (Signed)
 Referral sent to [facility] on patient's behalf. Patient provided with follow up instructions for [facility].   Patient provided with substance resource list.   Patient encourage to call and confirm placement.   [name] team to continue to assess.     Corey House, MSW, LCSWA 04/26/2024 11:48 AM

## 2024-04-26 NOTE — Progress Notes (Signed)
 [facility] MD Progress Note  04/26/2024 10:25 AM Corey House.  MRN:  [id number]  58 year old Caucasian male with reported history of MDD, opioid use disorder, noncompliance with medication regimen voluntarily presented to [facility] ED on 5/7 with chief complaints of no desire to live anymore, feeling anxious and shaky all the time. Reported depressed mood, lack of motivation, impaired sleep and appetite for several months, having difficulty finding employment, endorsed suicidal ideation with thoughts of hanging himself. Was admitted here at [facility] inpatient [facility] unit in January 2025, for similar complaints, was stabilized on Lexapro , Seroquel , clonidine  and trazodone . UDS negative, BAL less than 15. QTc 429.    Subjective: Patient's case discussed with multidisciplinary team, all vitals and notes reviewed.  No reported behavioral issues overnight.  Patient is seen for reassessment, he reports fair sleep overnight, appetite is good.  States he is not feeling well this morning, as he is feeling fatigued.  Encouraged oral fluid intake.  Continues to endorse depressed mood and anxiety, would not state what this is related to.  Denies SI/HI and AVH.  Denies any medication side effects.  Per nursing staff he received as needed Zofran  for nausea, as well as as needed medications for pain.  COWS score was a 3.  Clonidine  was held overnight as his blood pressure was reduced.  EKG NSR QTc 436.  Principal Problem: MDD (major depressive disorder), recurrent episode, severe (HCC) Diagnosis: Principal Problem:   MDD (major depressive disorder), recurrent episode, severe (HCC)  Total Time spent with patient: 20 minutes  Past Psychiatric History: see H&P  Past Medical History:  Past Medical History:  Diagnosis Date   Chronic cough    Urolithiasis    Urolithiasis     Past Surgical History:  Procedure Laterality Date   CATARACT EXTRACTION W/PHACO Left 08/09/2021   Procedure: CATARACT EXTRACTION PHACO AND  INTRAOCULAR LENS PLACEMENT (IOC) LEFT .051 00:10.0;  Surgeon: Annell Kidney, MD;  Location: [facility];  Service: Ophthalmology;  Laterality: Left;   CATARACT EXTRACTION W/PHACO Right 10/04/2021   Procedure: CATARACT EXTRACTION PHACO AND INTRAOCULAR LENS PLACEMENT (IOC) RIGHT 0.67 00:13.8;  Surgeon: Annell Kidney, MD;  Location: [facility];  Service: Ophthalmology;  Laterality: Right;  general anesthesia needs to be last case   HERNIA REPAIR     x2   MANDIBLE FRACTURE SURGERY     Family History: History reviewed. No pertinent family history. Family Psychiatric  History: see H&P Social History:  Social History   Substance and Sexual Activity  Alcohol Use No     Social History   Substance and Sexual Activity  Drug Use Not Currently    Social History   Socioeconomic History   Marital status: Single    Spouse name: Not on file   Number of children: Not on file   Years of education: Not on file   Highest education level: Not on file  Occupational History   Not on file  Tobacco Use   Smoking status: Every Day    Current packs/day: 1.00    Average packs/day: 1 pack/day for 35.0 years (35.0 ttl pk-yrs)    Types: Cigarettes   Smokeless tobacco: Never   Tobacco comments:    Started smoking age 80  Vaping Use   Vaping status: Never Used  Substance and Sexual Activity   Alcohol use: No   Drug use: Not Currently   Sexual activity: Not on file  Other Topics Concern   Not on file  Social History Narrative  Not on file   Social Drivers of Health   Financial Resource Strain: Not on file  Food Insecurity: Food Insecurity Present (04/23/2024)   Hunger Vital Sign    Worried About Running Out of Food in the Last Year: Sometimes true    Ran Out of Food in the Last Year: Often true  Transportation Needs: Unmet Transportation Needs (04/23/2024)   PRAPARE - Administrator, Civil Service (Medical): Yes    Lack of Transportation (Non-Medical):  Yes  Physical Activity: Not on file  Stress: Not on file  Social Connections: Unknown (04/23/2024)   Social Connection and Isolation Panel [NHANES]    Frequency of Communication with Friends and Family: Twice a week    Frequency of Social Gatherings with Friends and Family: Patient declined    Attends Religious Services: Patient declined    Database administrator or Organizations: Patient declined    Attends Engineer, structural: Patient declined    Marital Status: Patient declined   Additional Social History:                         Sleep: Good  Appetite:  Good  Current Medications: Current Facility-Administered Medications  Medication Dose Route Frequency Provider Last Rate Last Admin   acetaminophen  (TYLENOL ) tablet 650 mg  650 mg Oral Q6H PRN Darilyn Storbeck, PA-C   650 mg at 04/25/24 2128   albuterol  (VENTOLIN  HFA) 108 (90 Base) MCG/ACT inhaler 2 puff  2 puff Inhalation Q6H PRN Briyanna Billingham, PA-C       alum & mag hydroxide-simeth (MAALOX/MYLANTA) 200-200-20 MG/5ML suspension 30 mL  30 mL Oral Q4H PRN Marin Wisner, PA-C       cloNIDine  (CATAPRES ) tablet 0.1 mg  0.1 mg Oral QID Deem Marmol, PA-C   0.1 mg at 04/26/24 1191   Followed by   Cecily Cohen ON 04/27/2024] cloNIDine  (CATAPRES ) tablet 0.1 mg  0.1 mg Oral BH-qamhs Cherrise Occhipinti, PA-C       Followed by   Cecily Cohen ON 04/29/2024] cloNIDine  (CATAPRES ) tablet 0.1 mg  0.1 mg Oral QAC breakfast Alexi Geibel, PA-C       dicyclomine  (BENTYL ) tablet 20 mg  20 mg Oral Q6H PRN Rainey Rodger, PA-C       haloperidol  (HALDOL ) tablet 5 mg  5 mg Oral TID PRN Gilmar Bua, PA-C       And   diphenhydrAMINE  (BENADRYL ) capsule 50 mg  50 mg Oral TID PRN Marika Mahaffy, PA-C       haloperidol  lactate (HALDOL ) injection 5 mg  5 mg Intramuscular TID PRN Dorinne Graeff, PA-C       And   diphenhydrAMINE  (BENADRYL ) injection 50 mg  50 mg Intramuscular TID PRN Maksim Peregoy,  PA-C       And   LORazepam  (ATIVAN ) injection 2 mg  2 mg Intramuscular TID PRN Joselin Crandell, PA-C       haloperidol  lactate (HALDOL ) injection 10 mg  10 mg Intramuscular TID PRN Naoko Diperna, PA-C       And   diphenhydrAMINE  (BENADRYL ) injection 50 mg  50 mg Intramuscular TID PRN Aaliah Jorgenson, PA-C       And   LORazepam  (ATIVAN ) injection 2 mg  2 mg Intramuscular TID PRN Dashawn Bartnick, PA-C       escitalopram  (LEXAPRO ) tablet 10 mg  10 mg Oral Daily Rashell Shambaugh, PA-C   10 mg at 04/26/24 0838   feeding supplement (BOOST / RESOURCE BREEZE) liquid  1 Container  1 Container Oral TID BM Adan Baehr, PA-C       hydrOXYzine  (ATARAX ) tablet 25 mg  25 mg Oral Q6H PRN Herndon Grill, PA-C   25 mg at 04/25/24 2133   loperamide  (IMODIUM ) capsule 2-4 mg  2-4 mg Oral PRN Blondell Laperle, PA-C       magnesium  hydroxide (MILK OF MAGNESIA) suspension 30 mL  30 mL Oral Daily PRN Orland Visconti, PA-C       methocarbamol  (ROBAXIN ) tablet 500 mg  500 mg Oral Q8H PRN Krystall Kruckenberg, PA-C       multivitamin with minerals tablet 1 tablet  1 tablet Oral Daily [place] Lasseter, PA-C   1 tablet at 04/26/24 0839   naproxen  (NAPROSYN ) tablet 500 mg  500 mg Oral BID PRN Tremayne Sheldon, PA-C   500 mg at 04/26/24 1610   nicotine  (NICODERM CQ  - dosed in mg/24 hours) patch 14 mg  14 mg Transdermal Daily Artha Chiasson, PA-C   14 mg at 04/26/24 9604   ondansetron  (ZOFRAN -ODT) disintegrating tablet 4 mg  4 mg Oral Q6H PRN Korri Ask, PA-C   4 mg at 04/26/24 0840   QUEtiapine  (SEROQUEL ) tablet 50 mg  50 mg Oral QHS Adrain Butrick, PA-C   50 mg at 04/25/24 2133    Lab Results: No results found for this or any previous visit (from the past 48 hours).  Blood Alcohol level:  Lab Results  Component Value Date   [facility] <15 04/22/2024   ETH <10 12/27/2023    Metabolic Disorder Labs: Lab Results  Component Value Date   HGBA1C 6.1 (H) 12/29/2023    MPG 128.37 12/29/2023   No results found for: "PROLACTIN" Lab Results  Component Value Date   CHOL 155 12/29/2023   TRIG 114 12/29/2023   HDL 31 (L) 12/29/2023   CHOLHDL 5.0 12/29/2023   VLDL 23 12/29/2023   LDLCALC 101 (H) 12/29/2023    Physical Findings: AIMS:  , ,  ,  ,    CIWA:    COWS:  COWS Total Score: 4  Musculoskeletal: Strength & Muscle Tone: within normal limits Gait & Station: normal Patient leans: N/A  Psychiatric Specialty Exam:  Presentation  General Appearance:  Disheveled  Eye Contact: Fair  Speech: Clear and Coherent; Normal Rate  Speech Volume: Normal  Handedness: Right   Mood and Affect  Mood: Dysphoric; Depressed; Anxious  Affect: Blunt   Thought Process  Thought Processes: Coherent; Goal Directed  Descriptions of Associations:Intact  Orientation:Full (Time, Place and Person)  Thought Content:WDL  History of Schizophrenia/Schizoaffective disorder:No  Duration of Psychotic Symptoms:No data recorded Hallucinations:No data recorded  Ideas of Reference:None  Suicidal Thoughts:No data recorded  Homicidal Thoughts:No data recorded   Sensorium  Memory: Immediate Good; Recent Good  Judgment: Fair  Insight: Fair   Art therapist  Concentration: Good  Attention Span: Good  Recall: Good  Fund of Knowledge: Fair  Language: Fair   Psychomotor Activity  Psychomotor Activity: No data recorded   Assets  Assets: Communication Skills; Desire for Improvement; Leisure Time; Physical Health   Sleep  Sleep: No data recorded    Physical Exam: Physical Exam Vitals and nursing note reviewed.  Eyes:     Extraocular Movements: Extraocular movements intact.  Pulmonary:     Effort: Pulmonary effort is normal.  Neurological:     Mental Status: He is alert and oriented to person, place, and time.  Psychiatric:        Attention and Perception: Attention and  perception normal.        Mood and  Affect: Mood is anxious and depressed. Affect is blunt.        Speech: Speech normal.        Behavior: Behavior is withdrawn. Behavior is cooperative.        Thought Content: Thought content normal.        Cognition and Memory: Cognition and memory normal.        Judgment: Judgment is impulsive.    Review of Systems  Constitutional:  Positive for malaise/fatigue.  Musculoskeletal:  Positive for myalgias.  Psychiatric/Behavioral:  Positive for depression. The patient is nervous/anxious.   All other systems reviewed and are negative.  Blood pressure 108/82, pulse 70, temperature 98.2 F (36.8 C), resp. rate 20, height 5\' 4"  (1.626 m), weight 49.9 kg, SpO2 99%. Body mass index is 18.88 kg/m.   Treatment Plan Summary: MDD (major depressive disorder), recurrent episode, severe (HCC) 1.    Safety and Monitoring:   --  Voluntary admission to inpatient psychiatric unit for safety, stabilization and treatment -- Daily contact with patient to assess and evaluate symptoms and progress in treatment -- Patient's case to be discussed in multi-disciplinary team meeting -- Observation Level : q15 minute checks -- Vital signs:  q12 hours -- Precautions: suicide   2. Psychiatric Diagnoses and Treatment: Patient continues to slowly improve, reports improvement in symptoms of depression and anxiety.  Still reporting fatigue, body aches.  Most recent COWS score 3.  Denies SI/HI and AVH.  He is withdrawn to his room, med compliant.  We will continue to monitor for further treatment and stabilization.  Recommend substance use treatment program at discharge.   -- Continue clonidine  detox protocol, COWS protocol -- Continue Lexapro  10 mg daily for MDD/anxiety -- Continue Seroquel  50 mg at bedtime for sleep   --  The risks/benefits/side-effects/alternatives to this medication were discussed in detail with the patient and time was given for questions. The patient consents to medication trial. -- Metabolic  profile and EKG monitoring obtained while on an atypical antipsychotic  -- Encouraged patient to participate in unit milieu and in scheduled group therapies -- Short Term Goals: Ability to identify changes in lifestyle to reduce recurrence of condition will improve, Ability to verbalize feelings will improve, Ability to disclose and discuss suicidal ideas, Ability to demonstrate self-control will improve, Ability to identify and develop effective coping behaviors will improve, Ability to maintain clinical measurements within normal limits will improve, Compliance with prescribed medications will improve, and Ability to identify triggers associated with substance abuse/mental health issues will improve -- Long Term Goals: Improvement in symptoms so as ready for discharge        3. Medical Issues Being Addressed:               COPD, restart home meds    4. Discharge Planning:   -- Social work and case management to assist with discharge planning and identification of hospital follow-up needs prior to discharge -- Estimated LOS: 5-7 days -- Discharge Concerns: Need to establish a safety plan; Medication compliance and effectiveness -- Discharge Goals: Return home with outpatient referrals for mental health follow-up including medication management/psychotherapy  Sheilda Deputy, PA-C 04/26/2024, 10:25 AM

## 2024-04-26 NOTE — Plan of Care (Signed)
  Problem: Health Behavior/Discharge Planning: Goal: Identification of resources available to assist in meeting health care needs will improve Outcome: Progressing Goal: Compliance with treatment plan for underlying cause of condition will improve Outcome: Progressing   Problem: Safety: Goal: Periods of time without injury will increase Outcome: Progressing

## 2024-04-26 NOTE — Progress Notes (Signed)
 Pt calm and pleasant during assessment. Pt denies SI/HI/AVH. Pt endorses pain 9/10 in his teeth, PRN medication given. Pt isolative to his room tonight. Compliant with medication administration per MD orders. Pt given education, support, and encouragement to be active in his treatment plan. Pt being monitored Q 15 minutes for safety per unit protocol, remains safe on the unit

## 2024-04-26 NOTE — Progress Notes (Signed)
 Patient presents with flat affect stating he did not sleep too well last night and is reporting generalized pain. Patient also reported nausea this morning and received zofran  and naprosyn  po prn along with scheduled medications. Patients cow score 4. Patient remains cooperative in unit at this time.

## 2024-04-26 NOTE — Group Note (Signed)
 Date:  04/26/2024 Time:  4:28 PM  Group Topic/Focus:  Goals Group:   The focus of this group is to help patients establish daily goals to achieve during treatment and discuss how the patient can incorporate goal setting into their daily lives to aide in recovery.   Participation Level:  Did Not Attend   Corey House [name] 04/26/2024, 4:28 PM

## 2024-04-27 NOTE — Progress Notes (Signed)
 Patient presents with flat affect but cooperative. Patient currently denies SI,HI, and A/V/H with no plan or intent. Patient stating he slept poorly and reports increasing anxiety. Patient received tylenol  and hydroxyzine  po prn. Cows score 3 this morning. Patient compliant with all medications and remains mostly isolative to room. No s/s of current distress.

## 2024-04-27 NOTE — Group Note (Signed)
 Date:  04/27/2024 Time:  8:40 PM  Group Topic/Focus:  Orientation:   The focus of this group is to educate the patient on the purpose and policies of crisis stabilization and provide a format to answer questions about their admission.  The group details unit policies and expectations of patients while admitted.    Participation Level:  Active  Participation Quality:  Appropriate, Attentive, and Supportive  Affect:  Appropriate  Cognitive:  Alert and Appropriate  Insight: Appropriate and Good  Engagement in Group:  Developing/Improving and Engaged  Modes of Intervention:  Clarification, Discussion, Education, Orientation, Rapport Building, and Support  Additional Comments:     Corey House 04/27/2024, 8:40 PM

## 2024-04-27 NOTE — BHH Counselor (Addendum)
 [name] touched base with [facility]. [facility] intake coordinator confirmed that patient has not called to do his phone screening.   [facility] also reports that with the patient having the standard plan, he would need to switch to a tailored plan to be considered.   Patient has not left his room and when [name] went to touch base with the patient on the information received, he was sleeping.  This was communicated to team.   [name] team to continue to assess.     Corey House, MSW, LCSWA 04/27/2024 3:04 PM

## 2024-04-27 NOTE — Plan of Care (Signed)
   Problem: Education: Goal: Emotional status will improve Outcome: Progressing Goal: Mental status will improve Outcome: Progressing

## 2024-04-27 NOTE — Group Note (Signed)
 Date:  04/27/2024 Time:  10:28 AM  Group Topic/Focus:  Recovery Goals:   The focus of this group is to identify appropriate goals for recovery and establish a plan to achieve them.    Participation Level:  Did Not Attend   Corey House 04/27/2024, 10:28 AM

## 2024-04-27 NOTE — Plan of Care (Signed)
  Problem: Health Behavior/Discharge Planning: Goal: Compliance with treatment plan for underlying cause of condition will improve Outcome: Progressing   Problem: Physical Regulation: Goal: Ability to maintain clinical measurements within normal limits will improve Outcome: Progressing   Problem: Safety: Goal: Periods of time without injury will increase Outcome: Progressing   

## 2024-04-27 NOTE — Progress Notes (Signed)
 [facility] MD Progress Note  04/27/2024 2:23 PM Corey House.  MRN:  [id number]  58 year old Caucasian male with reported history of MDD, opioid use disorder, noncompliance with medication regimen voluntarily presented to [facility] ED on 5/7 with chief complaints of no desire to live anymore, feeling anxious and shaky all the time. Reported depressed mood, lack of motivation, impaired sleep and appetite for several months, having difficulty finding employment, endorsed suicidal ideation with thoughts of hanging himself. Was admitted here at [facility] inpatient [facility] unit in January 2025, for similar complaints, was stabilized on Lexapro , Seroquel , clonidine  and trazodone . UDS negative, BAL less than 15. QTc 429.   Subjective: Patient case was discussed with multidisciplinary team vitals and notes reviewed.  No behavioral issues overnight.  Patient was seen for reassessment.  Indicates sleep is fair.  Appetite is good.  Continues to note some fatigue.  Continues to note depressed mood and anxiety however he indicates that this is improving and is interested in seeking following treatment after discharge.  Received hydroxyzine  Haldol  and Benadryl  overnight.  Has interview with [facility] today. Principal Problem: MDD (major depressive disorder), recurrent episode, severe (HCC) Diagnosis: Principal Problem:   MDD (major depressive disorder), recurrent episode, severe (HCC)  Total Time spent with patient: 20 minutes  Past Psychiatric History: See H&P  Past Medical History:  Past Medical History:  Diagnosis Date   Chronic cough    Urolithiasis    Urolithiasis     Past Surgical History:  Procedure Laterality Date   CATARACT EXTRACTION W/PHACO Left 08/09/2021   Procedure: CATARACT EXTRACTION PHACO AND INTRAOCULAR LENS PLACEMENT (IOC) LEFT .051 00:10.0;  Surgeon: Annell Kidney, MD;  Location: [facility];  Service: Ophthalmology;  Laterality: Left;   CATARACT EXTRACTION W/PHACO Right 10/04/2021    Procedure: CATARACT EXTRACTION PHACO AND INTRAOCULAR LENS PLACEMENT (IOC) RIGHT 0.67 00:13.8;  Surgeon: Annell Kidney, MD;  Location: [facility];  Service: Ophthalmology;  Laterality: Right;  general anesthesia needs to be last case   HERNIA REPAIR     x2   MANDIBLE FRACTURE SURGERY     Family History: History reviewed. No pertinent family history. Family Psychiatric  History: See H&P Social History:  Social History   Substance and Sexual Activity  Alcohol Use No     Social History   Substance and Sexual Activity  Drug Use Not Currently    Social History   Socioeconomic History   Marital status: Single    Spouse name: Not on file   Number of children: Not on file   Years of education: Not on file   Highest education level: Not on file  Occupational History   Not on file  Tobacco Use   Smoking status: Every Day    Current packs/day: 1.00    Average packs/day: 1 pack/day for 35.0 years (35.0 ttl pk-yrs)    Types: Cigarettes   Smokeless tobacco: Never   Tobacco comments:    Started smoking age 54  Vaping Use   Vaping status: Never Used  Substance and Sexual Activity   Alcohol use: No   Drug use: Not Currently   Sexual activity: Not on file  Other Topics Concern   Not on file  Social History Narrative   Not on file   Social Drivers of Health   Financial Resource Strain: Not on file  Food Insecurity: Food Insecurity Present (04/23/2024)   Hunger Vital Sign    Worried About Running Out of Food in the Last Year: Sometimes true  Ran Out of Food in the Last Year: Often true  Transportation Needs: Unmet Transportation Needs (04/23/2024)   PRAPARE - Administrator, Civil Service (Medical): Yes    Lack of Transportation (Non-Medical): Yes  Physical Activity: Not on file  Stress: Not on file  Social Connections: Unknown (04/23/2024)   Social Connection and Isolation Panel [NHANES]    Frequency of Communication with Friends and Family: Twice a  week    Frequency of Social Gatherings with Friends and Family: Patient declined    Attends Religious Services: Patient declined    Database administrator or Organizations: Patient declined    Attends Engineer, structural: Patient declined    Marital Status: Patient declined   Additional Social History:                         Sleep: Good  Appetite:  Good  Current Medications: Current Facility-Administered Medications  Medication Dose Route Frequency Provider Last Rate Last Admin   acetaminophen  (TYLENOL ) tablet 650 mg  650 mg Oral Q6H PRN Tingling, Stephanie, PA-C   650 mg at 04/27/24 0804   albuterol  (VENTOLIN  HFA) 108 (90 Base) MCG/ACT inhaler 2 puff  2 puff Inhalation Q6H PRN Tingling, Stephanie, PA-C       alum & mag hydroxide-simeth (MAALOX/MYLANTA) 200-200-20 MG/5ML suspension 30 mL  30 mL Oral Q4H PRN Tingling, Stephanie, PA-C       cloNIDine  (CATAPRES ) tablet 0.1 mg  0.1 mg Oral BH-qamhs Tingling, Stephanie, PA-C   0.1 mg at 04/27/24 0802   Followed by   Cecily Cohen ON 04/29/2024] cloNIDine  (CATAPRES ) tablet 0.1 mg  0.1 mg Oral QAC breakfast Tingling, Stephanie, PA-C       dicyclomine  (BENTYL ) tablet 20 mg  20 mg Oral Q6H PRN Tingling, Stephanie, PA-C       haloperidol  (HALDOL ) tablet 5 mg  5 mg Oral TID PRN Tingling, Stephanie, PA-C   5 mg at 04/26/24 2108   And   diphenhydrAMINE  (BENADRYL ) capsule 50 mg  50 mg Oral TID PRN Tingling, Stephanie, PA-C   50 mg at 04/26/24 2108   haloperidol  lactate (HALDOL ) injection 5 mg  5 mg Intramuscular TID PRN Tingling, Stephanie, PA-C       And   diphenhydrAMINE  (BENADRYL ) injection 50 mg  50 mg Intramuscular TID PRN Tingling, Stephanie, PA-C       And   LORazepam  (ATIVAN ) injection 2 mg  2 mg Intramuscular TID PRN Tingling, Stephanie, PA-C       haloperidol  lactate (HALDOL ) injection 10 mg  10 mg Intramuscular TID PRN Tingling, Stephanie, PA-C       And   diphenhydrAMINE  (BENADRYL ) injection 50 mg  50 mg Intramuscular TID  PRN Tingling, Stephanie, PA-C       And   LORazepam  (ATIVAN ) injection 2 mg  2 mg Intramuscular TID PRN Tingling, Stephanie, PA-C       escitalopram  (LEXAPRO ) tablet 10 mg  10 mg Oral Daily Tingling, Stephanie, PA-C   10 mg at 04/27/24 9604   feeding supplement (BOOST / RESOURCE BREEZE) liquid 1 Container  1 Container Oral TID BM Tingling, Stephanie, PA-C       hydrOXYzine  (ATARAX ) tablet 25 mg  25 mg Oral Q6H PRN Tingling, Stephanie, PA-C   25 mg at 04/27/24 0803   loperamide  (IMODIUM ) capsule 2-4 mg  2-4 mg Oral PRN Tingling, Stephanie, PA-C       magnesium  hydroxide (MILK OF MAGNESIA) suspension 30 mL  30 mL Oral Daily PRN Tingling, Stephanie, PA-C       methocarbamol  (ROBAXIN ) tablet 500 mg  500 mg Oral Q8H PRN Tingling, Stephanie, PA-C       multivitamin with minerals tablet 1 tablet  1 tablet Oral Daily Tingling, Stephanie, PA-C   1 tablet at 04/27/24 0802   naproxen  (NAPROSYN ) tablet 500 mg  500 mg Oral BID PRN Tingling, Stephanie, PA-C   500 mg at 04/26/24 2338   nicotine  (NICODERM CQ  - dosed in mg/24 hours) patch 14 mg  14 mg Transdermal Daily Tingling, Stephanie, PA-C   14 mg at 04/27/24 0801   ondansetron  (ZOFRAN -ODT) disintegrating tablet 4 mg  4 mg Oral Q6H PRN Tingling, Stephanie, PA-C   4 mg at 04/26/24 0840   QUEtiapine  (SEROQUEL ) tablet 50 mg  50 mg Oral QHS Tingling, Stephanie, PA-C   50 mg at 04/26/24 2106    Lab Results: No results found for this or any previous visit (from the past 48 hours).  Blood Alcohol level:  Lab Results  Component Value Date   [facility] <15 04/22/2024   ETH <10 12/27/2023    Metabolic Disorder Labs: Lab Results  Component Value Date   HGBA1C 6.1 (H) 12/29/2023   MPG 128.37 12/29/2023   No results found for: "PROLACTIN" Lab Results  Component Value Date   CHOL 155 12/29/2023   TRIG 114 12/29/2023   HDL 31 (L) 12/29/2023   CHOLHDL 5.0 12/29/2023   VLDL 23 12/29/2023   LDLCALC 101 (H) 12/29/2023    Physical Findings: AIMS:  , ,  ,  ,     CIWA:    COWS:  COWS Total Score: 3  Musculoskeletal: Strength & Muscle Tone: within normal limits Gait & Station: normal Patient leans: N/A  Psychiatric Specialty Exam:  Presentation  General Appearance:  Disheveled  Eye Contact: Absent  Speech: Normal Rate  Speech Volume: Normal  Handedness: Right   Mood and Affect  Mood: Euthymic  Affect: Congruent   Thought Process  Thought Processes: Linear  Descriptions of Associations:Intact  Orientation:Full (Time, Place and Person)  Thought Content:Logical  History of Schizophrenia/Schizoaffective disorder:No  Duration of Psychotic Symptoms:No data recorded Hallucinations:Hallucinations: None  Ideas of Reference:None  Suicidal Thoughts:Suicidal Thoughts: No  Homicidal Thoughts:Homicidal Thoughts: No   Sensorium  Memory: Immediate Fair  Judgment: Fair (improving)  Insight: Fair   Art therapist  Concentration: Fair  Attention Span: Good  Recall: Good  Fund of Knowledge: Fair  Language: Fair   Psychomotor Activity  Psychomotor Activity: Psychomotor Activity: Normal   Assets  Assets: Communication Skills; Desire for Improvement; Leisure Time; Physical Health   Sleep  Sleep: Sleep: Fair    Physical Exam: Physical Exam Constitutional:      Appearance: He is normal weight.  HENT:     Head: Normocephalic and atraumatic.  Eyes:     Extraocular Movements: Extraocular movements intact.  Pulmonary:     Effort: Pulmonary effort is normal.  Neurological:     Mental Status: He is alert.  Psychiatric:        Behavior: Behavior normal.    Review of Systems  Constitutional:  Positive for malaise/fatigue.  Respiratory:  Negative for cough.   Psychiatric/Behavioral:  Negative for hallucinations and suicidal ideas. The patient does not have insomnia.   All other systems reviewed and are negative.  Blood pressure 112/84, pulse 67, temperature 99 F (37.2 C), resp.  rate 18, height 5\' 4"  (1.626 m), weight 49.9 kg, SpO2 99%. Body mass index  is 18.88 kg/m.   Treatment Plan Summary: MDD (major depressive disorder), recurrent episode, severe (HCC) 1.    Safety and Monitoring:   --  Voluntary admission to inpatient psychiatric unit for safety, stabilization and treatment -- Daily contact with patient to assess and evaluate symptoms and progress in treatment -- Patient's case to be discussed in multi-disciplinary team meeting -- Observation Level : q15 minute checks -- Vital signs:  q12 hours -- Precautions: suicide   2. Psychiatric Diagnoses and Treatment:  Patient continues to show improvement continue to encourage patient to participate in unit milieu.  Continues to report fatigue and bodyaches.  Will continue COWS.  Denies SI, HI, and AVH.  He is med compliant.  Has interviewed today for colon care.  Continue to encourage substance use treatment.   -- Continue clonidine  detox protocol, COWS protocol -- Continue Lexapro  10 mg daily for MDD/anxiety -- Continue Seroquel  50 mg at bedtime for sleep   --  The risks/benefits/side-effects/alternatives to this medication were discussed in detail with the patient and time was given for questions. The patient consents to medication trial. -- Metabolic profile and EKG monitoring obtained while on an atypical antipsychotic  -- Encouraged patient to participate in unit milieu and in scheduled group therapies -- Short Term Goals: Ability to identify changes in lifestyle to reduce recurrence of condition will improve, Ability to verbalize feelings will improve, Ability to disclose and discuss suicidal ideas, Ability to demonstrate self-control will improve, Ability to identify and develop effective coping behaviors will improve, Ability to maintain clinical measurements within normal limits will improve, Compliance with prescribed medications will improve, and Ability to identify triggers associated with substance  abuse/mental health issues will improve -- Long Term Goals: Improvement in symptoms so as ready for discharge        3. Medical Issues Being Addressed:               COPD, restart home meds    4. Discharge Planning:   -- Social work and case management to assist with discharge planning and identification of hospital follow-up needs prior to discharge -- Estimated LOS: 5-7 days -- Discharge Concerns: Need to establish a safety plan; Medication compliance and effectiveness -- Discharge Goals: Return home with outpatient referrals for mental health follow-up including medication management/psychotherapy  I have reviewed this case with Dr. Jadapalle who is agreeable with this plan.   Fay Hoop, PA-C 04/27/2024, 2:23 PM

## 2024-04-27 NOTE — Group Note (Signed)
 [facility] LCSW Group Therapy Note    Group Date: 04/27/2024 Start Time: 1300 End Time: 1345  Type of Therapy and Topic:  Group Therapy:  Overcoming Obstacles  Participation Level:  [facility] PARTICIPATION LEVEL: Did Not Attend   Description of Group:   In this group patients will be encouraged to explore what they see as obstacles to their own wellness and recovery. They will be guided to discuss their thoughts, feelings, and behaviors related to these obstacles. The group will process together ways to cope with barriers, with attention given to specific choices patients can make. Each patient will be challenged to identify changes they are motivated to make in order to overcome their obstacles. This group will be process-oriented, with patients participating in exploration of their own experiences as well as giving and receiving support and challenge from other group members.  Therapeutic Goals: 1. Patient will identify personal and current obstacles as they relate to admission. 2. Patient will identify barriers that currently interfere with their wellness or overcoming obstacles.  3. Patient will identify feelings, thought process and behaviors related to these barriers. 4. Patient will identify two changes they are willing to make to overcome these obstacles:    Summary of Patient Progress Patient did not attend group.   Therapeutic Modalities:   Cognitive Behavioral Therapy Solution Focused Therapy Motivational Interviewing Relapse Prevention Therapy   Randolm Butte, LCSW

## 2024-04-27 NOTE — Group Note (Signed)
 Recreation Therapy Group Note   Group Topic:Self-Esteem  Group Date: 04/27/2024 Start Time: 1030 End Time: 1120 Facilitators: Deatrice Factor, LRT, CTRS Location: Craft Room  Group Description: Positive Affirmation Worksheet. Patients and LRT discussed the importance of self-love/self-esteem and things that cause it to fluctuate, including our mental health. Patients completed a worksheet that helps them identify 24 different strengths and qualities about themselves. Pt encouraged to read aloud at least 3 off their sheet to the group. LRT and pts discussed how this can be applied to daily life post-discharge. After completing worksheet, patients played Positive Affirmation Bingo and won stress balls, candy, or an activity book as a prize.   Goal Area(s) Addressed: Patient will identify positive qualities about themselves. Patient will learn new positive affirmations.  Patient will recite positive qualities and affirmations aloud to the group.  Patient will practice positive self-talk.  Patient will increase communication.   Affect/Mood: N/A   Participation Level: Did not attend    Clinical Observations/Individualized Feedback: Patient did not attend group.   Plan: Continue to engage patient in RT group sessions 2-3x/week.   Deatrice Factor, LRT, CTRS 04/27/2024 1:25 PM

## 2024-04-27 NOTE — Progress Notes (Signed)
 Pt calm and pleasant during assessment. Pt denies SI/HI/AVH. Pt observed by this Clinical research associate interacting appropriately with staff and peers on the unit. Pt. compliant with medication administration per MD orders. Pt given education, support, and encouragement to be active in his treatment plan. Pt being monitored Q 15 minutes for safety per unit protocol, remains safe on the unit

## 2024-04-28 MED ORDER — QUETIAPINE FUMARATE 100 MG PO TABS
100.0000 mg | ORAL_TABLET | Freq: Every day | ORAL | Status: DC
  Administered 2024-04-28 – 2024-04-29 (×2): 100 mg via ORAL
  Filled 2024-04-28 (×2): qty 1

## 2024-04-28 NOTE — Group Note (Signed)
 Date:  04/28/2024 Time:  10:10 AM  Group Topic/Focus:  Healthy Communication:   The focus of this group is to discuss communication, barriers to communication, as well as healthy ways to communicate with others.    Participation Level:  Did Not Attend   Corey House Corey House 04/28/2024, 10:10 AM

## 2024-04-28 NOTE — Group Note (Signed)
 Date:  04/28/2024 Time:  4:35 PM  Group Topic/Focus:  Activity Group: The focus of the group is to promote activity for the patients and encourage them to go outside to the courtyard and get some fresh air and some exercise.    Participation Level:  Active  Participation Quality:  Appropriate  Affect:  Appropriate  Cognitive:  Appropriate  Insight: Appropriate  Engagement in Group:  Engaged  Modes of Intervention:  Activity  Additional Comments:    Marianna Shirk Javier Gell 04/28/2024, 4:35 PM

## 2024-04-28 NOTE — Plan of Care (Signed)
   Problem: Education: Goal: Emotional status will improve Outcome: Progressing Goal: Mental status will improve Outcome: Progressing

## 2024-04-28 NOTE — Plan of Care (Signed)

## 2024-04-28 NOTE — Progress Notes (Signed)
   04/28/24 0800  Clinical Opiate Withdrawal Scale (COWS)  Resting Pulse Rate 1  Sweating 0  Restlessness 0  Pupil Size 0  Bone or Joint Aches 2  Runny Nose or Tearing 0  GI Upset 0  Tremor 2  Yawning 0  Anxiety or Irritability 1  Gooseflesh Skin 0  COWS Total Score 6

## 2024-04-28 NOTE — Group Note (Unsigned)
 Date:  04/28/2024 Time:  8:49 PM  Group Topic/Focus:  Wrap-Up Group:   The focus of this group is to help patients review their daily goal of treatment and discuss progress on daily workbooks.     Participation Level:  {BHH PARTICIPATION ZOXWR:60454}  Participation Quality:  {BHH PARTICIPATION QUALITY:22265}  Affect:  {BHH AFFECT:22266}  Cognitive:  {BHH COGNITIVE:22267}  Insight: {BHH Insight2:20797}  Engagement in Group:  {BHH ENGAGEMENT IN UJWJX:91478}  Modes of Intervention:  {BHH MODES OF INTERVENTION:22269}  Additional Comments:  ***  Fabiola Holy 04/28/2024, 8:49 PM

## 2024-04-28 NOTE — Progress Notes (Signed)
 [facility] MD Progress Note  04/28/2024 5:17 PM [place].  MRN:  [id number]  58 year old Caucasian male with reported history of MDD, opioid use disorder, noncompliance with medication regimen voluntarily presented to [facility] ED on 5/7 with chief complaints of no desire to live anymore, feeling anxious and shaky all the time. Reported depressed mood, lack of motivation, impaired sleep and appetite for several months, having difficulty finding employment, endorsed suicidal ideation with thoughts of hanging himself. Was admitted here at [facility] inpatient [facility] unit in January 2025, for similar complaints, was stabilized on Lexapro , Seroquel , clonidine  and trazodone . UDS negative, BAL less than 15. QTc 429.   Subjective: Patient case was discussed with multidisciplinary team vitals and notes reviewed.  No behavioral issues overnight.  Patient was seen for reassessment.  Patient reports sleep and appetite are stable.  Did not make the recall with Dr. Carolin Chyle but are encouraged to call today.  Again reviewed all overnight will increase nightly Seroquel  given frequent PRNs at night.  Notes improvement in depression with continued anxiety.  He is more optimistic about going home indicates that he is open to either going residential treatment or going to stay with a friend.  He is encouraged to participate in the group milieu.  Denies SI, HI, and AVH.  Tolerating taper well.  Denies current symptoms of withdrawal.    Principal Problem: MDD (major depressive disorder), recurrent episode, severe (HCC) Diagnosis: Principal Problem:   MDD (major depressive disorder), recurrent episode, severe (HCC)  Total Time spent with patient: 20 minutes  Past Psychiatric History: See H&P  Past Medical History:  Past Medical History:  Diagnosis Date   Chronic cough    Urolithiasis    Urolithiasis     Past Surgical History:  Procedure Laterality Date   CATARACT EXTRACTION W/PHACO Left 08/09/2021   Procedure: CATARACT  EXTRACTION PHACO AND INTRAOCULAR LENS PLACEMENT (IOC) LEFT .051 00:10.0;  Surgeon: Annell Kidney, MD;  Location: [facility];  Service: Ophthalmology;  Laterality: Left;   CATARACT EXTRACTION W/PHACO Right 10/04/2021   Procedure: CATARACT EXTRACTION PHACO AND INTRAOCULAR LENS PLACEMENT (IOC) RIGHT 0.67 00:13.8;  Surgeon: Annell Kidney, MD;  Location: [facility];  Service: Ophthalmology;  Laterality: Right;  general anesthesia needs to be last case   HERNIA REPAIR     x2   MANDIBLE FRACTURE SURGERY     Family History: History reviewed. No pertinent family history. Family Psychiatric  History: See H&P Social History:  Social History   Substance and Sexual Activity  Alcohol Use No     Social History   Substance and Sexual Activity  Drug Use Not Currently    Social History   Socioeconomic History   Marital status: Single    Spouse name: Not on file   Number of children: Not on file   Years of education: Not on file   Highest education level: Not on file  Occupational History   Not on file  Tobacco Use   Smoking status: Every Day    Current packs/day: 1.00    Average packs/day: 1 pack/day for 35.0 years (35.0 ttl pk-yrs)    Types: Cigarettes   Smokeless tobacco: Never   Tobacco comments:    Started smoking age 51  Vaping Use   Vaping status: Never Used  Substance and Sexual Activity   Alcohol use: No   Drug use: Not Currently   Sexual activity: Not on file  Other Topics Concern   Not on file  Social History Narrative  Not on file   Social Drivers of Health   Financial Resource Strain: Not on file  Food Insecurity: Food Insecurity Present (04/23/2024)   Hunger Vital Sign    Worried About Running Out of Food in the Last Year: Sometimes true    Ran Out of Food in the Last Year: Often true  Transportation Needs: Unmet Transportation Needs (04/23/2024)   PRAPARE - Administrator, Civil Service (Medical): Yes    Lack of  Transportation (Non-Medical): Yes  Physical Activity: Not on file  Stress: Not on file  Social Connections: Unknown (04/23/2024)   Social Connection and Isolation Panel [NHANES]    Frequency of Communication with Friends and Family: Twice a week    Frequency of Social Gatherings with Friends and Family: Patient declined    Attends Religious Services: Patient declined    Database administrator or Organizations: Patient declined    Attends Engineer, structural: Patient declined    Marital Status: Patient declined   Additional Social History:                         Sleep: Good  Appetite:  Good  Current Medications: Current Facility-Administered Medications  Medication Dose Route Frequency Provider Last Rate Last Admin   acetaminophen  (TYLENOL ) tablet 650 mg  650 mg Oral Q6H PRN Tingling, Stephanie, PA-C   650 mg at 04/28/24 1529   albuterol  (VENTOLIN  HFA) 108 (90 Base) MCG/ACT inhaler 2 puff  2 puff Inhalation Q6H PRN Tingling, Stephanie, PA-C       alum & mag hydroxide-simeth (MAALOX/MYLANTA) 200-200-20 MG/5ML suspension 30 mL  30 mL Oral Q4H PRN Tingling, Stephanie, PA-C       cloNIDine  (CATAPRES ) tablet 0.1 mg  0.1 mg Oral BH-qamhs Tingling, Stephanie, PA-C   0.1 mg at 04/28/24 1610   Followed by   Cecily Cohen ON 04/29/2024] cloNIDine  (CATAPRES ) tablet 0.1 mg  0.1 mg Oral QAC breakfast Tingling, Stephanie, PA-C       dicyclomine  (BENTYL ) tablet 20 mg  20 mg Oral Q6H PRN Tingling, Stephanie, PA-C       haloperidol  (HALDOL ) tablet 5 mg  5 mg Oral TID PRN Tingling, Stephanie, PA-C   5 mg at 04/28/24 9604   And   diphenhydrAMINE  (BENADRYL ) capsule 50 mg  50 mg Oral TID PRN Tingling, Stephanie, PA-C   50 mg at 04/28/24 0033   haloperidol  lactate (HALDOL ) injection 5 mg  5 mg Intramuscular TID PRN Tingling, Stephanie, PA-C       And   diphenhydrAMINE  (BENADRYL ) injection 50 mg  50 mg Intramuscular TID PRN Tingling, Stephanie, PA-C       And   LORazepam  (ATIVAN ) injection 2  mg  2 mg Intramuscular TID PRN Tingling, Stephanie, PA-C       haloperidol  lactate (HALDOL ) injection 10 mg  10 mg Intramuscular TID PRN Tingling, Stephanie, PA-C       And   diphenhydrAMINE  (BENADRYL ) injection 50 mg  50 mg Intramuscular TID PRN Tingling, Stephanie, PA-C       And   LORazepam  (ATIVAN ) injection 2 mg  2 mg Intramuscular TID PRN Tingling, Stephanie, PA-C       escitalopram  (LEXAPRO ) tablet 10 mg  10 mg Oral Daily Tingling, Stephanie, PA-C   10 mg at 04/28/24 5409   feeding supplement (BOOST / RESOURCE BREEZE) liquid 1 Container  1 Container Oral TID BM Tingling, Stephanie, PA-C       hydrOXYzine  (ATARAX ) tablet  25 mg  25 mg Oral Q6H PRN Tingling, Stephanie, PA-C   25 mg at 04/27/24 1739   loperamide  (IMODIUM ) capsule 2-4 mg  2-4 mg Oral PRN Tingling, Stephanie, PA-C       magnesium  hydroxide (MILK OF MAGNESIA) suspension 30 mL  30 mL Oral Daily PRN Tingling, Stephanie, PA-C       methocarbamol  (ROBAXIN ) tablet 500 mg  500 mg Oral Q8H PRN Tingling, Stephanie, PA-C   500 mg at 04/28/24 0845   multivitamin with minerals tablet 1 tablet  1 tablet Oral Daily Tingling, Stephanie, PA-C   1 tablet at 04/28/24 0842   naproxen  (NAPROSYN ) tablet 500 mg  500 mg Oral BID PRN Tingling, Stephanie, PA-C   500 mg at 04/28/24 1529   nicotine  (NICODERM CQ  - dosed in mg/24 hours) patch 14 mg  14 mg Transdermal Daily Tingling, Stephanie, PA-C   14 mg at 04/28/24 0844   ondansetron  (ZOFRAN -ODT) disintegrating tablet 4 mg  4 mg Oral Q6H PRN Tingling, Stephanie, PA-C   4 mg at 04/26/24 0840   QUEtiapine  (SEROQUEL ) tablet 50 mg  50 mg Oral QHS Tingling, Stephanie, PA-C   50 mg at 04/27/24 2109    Lab Results: No results found for this or any previous visit (from the past 48 hours).  Blood Alcohol level:  Lab Results  Component Value Date   [facility] <15 04/22/2024   ETH <10 12/27/2023    Metabolic Disorder Labs: Lab Results  Component Value Date   HGBA1C 6.1 (H) 12/29/2023   MPG 128.37 12/29/2023    No results found for: "PROLACTIN" Lab Results  Component Value Date   CHOL 155 12/29/2023   TRIG 114 12/29/2023   HDL 31 (L) 12/29/2023   CHOLHDL 5.0 12/29/2023   VLDL 23 12/29/2023   LDLCALC 101 (H) 12/29/2023    Physical Findings: AIMS:  , ,  ,  ,    CIWA:    COWS:  COWS Total Score: 1  Musculoskeletal: Strength & Muscle Tone: within normal limits Gait & Station: normal Patient leans: N/A  Psychiatric Specialty Exam:  Presentation  General Appearance:  Disheveled  Eye Contact: Absent  Speech: Normal Rate  Speech Volume: Normal  Handedness: Right   Mood and Affect  Mood: Euthymic  Affect: Congruent   Thought Process  Thought Processes: Linear  Descriptions of Associations:Intact  Orientation:Full (Time, Place and Person)  Thought Content:Logical  History of Schizophrenia/Schizoaffective disorder:No  Duration of Psychotic Symptoms:No data recorded Hallucinations:Hallucinations: None  Ideas of Reference:None  Suicidal Thoughts:Suicidal Thoughts: No  Homicidal Thoughts:Homicidal Thoughts: No   Sensorium  Memory: Immediate Fair  Judgment: Fair  Insight: Fair   Art therapist  Concentration: Fair  Attention Span: Good  Recall: Good  Fund of Knowledge: Good  Language: Good   Psychomotor Activity  Psychomotor Activity: Psychomotor Activity: Normal   Assets  Assets: Communication Skills; Desire for Improvement; Leisure Time; Physical Health   Sleep  Sleep: Sleep: Fair    Physical Exam: Physical Exam Constitutional:      Appearance: He is normal weight.  HENT:     Head: Normocephalic and atraumatic.  Eyes:     Extraocular Movements: Extraocular movements intact.  Pulmonary:     Effort: Pulmonary effort is normal.  Neurological:     Mental Status: He is alert.  Psychiatric:        Behavior: Behavior normal.    Review of Systems  Constitutional:  Positive for malaise/fatigue.   Respiratory:  Negative for cough.  Psychiatric/Behavioral:  Positive for depression. Negative for hallucinations and suicidal ideas. The patient does not have insomnia.   All other systems reviewed and are negative.  Blood pressure 103/82, pulse 72, temperature 98.3 F (36.8 C), resp. rate 16, height 5\' 4"  (1.626 m), weight 49.9 kg, SpO2 99%. Body mass index is 18.88 kg/m.   Treatment Plan Summary: MDD (major depressive disorder), recurrent episode, severe (HCC) 1.    Safety and Monitoring:   --  Voluntary admission to inpatient psychiatric unit for safety, stabilization and treatment -- Daily contact with patient to assess and evaluate symptoms and progress in treatment -- Patient's case to be discussed in multi-disciplinary team meeting -- Observation Level : q15 minute checks -- Vital signs:  q12 hours -- Precautions: suicide   2. Psychiatric Diagnoses and Treatment:  Patient continues to show improvement continue to encourage patient to participate in unit milieu.  Continues to report fatigue and bodyaches.  Will continue COWS.  Denies SI, HI, and AVH.  He is med compliant.  Has interviewed today for colon care.  Continue to encourage substance use treatment.   -- Continue clonidine  detox protocol, COWS protocol -- Continue Lexapro  10 mg daily for MDD/anxiety -- Increase Seroquel  to 100 mg at bedtime for mood and sleep   --  The risks/benefits/side-effects/alternatives to this medication were discussed in detail with the patient and time was given for questions. The patient consents to medication trial. -- Metabolic profile and EKG monitoring obtained while on an atypical antipsychotic  -- Encouraged patient to participate in unit milieu and in scheduled group therapies -- Short Term Goals: Ability to identify changes in lifestyle to reduce recurrence of condition will improve, Ability to verbalize feelings will improve, Ability to disclose and discuss suicidal ideas, Ability to  demonstrate self-control will improve, Ability to identify and develop effective coping behaviors will improve, Ability to maintain clinical measurements within normal limits will improve, Compliance with prescribed medications will improve, and Ability to identify triggers associated with substance abuse/mental health issues will improve -- Long Term Goals: Improvement in symptoms so as ready for discharge        3. Medical Issues Being Addressed:               COPD, restart home meds    4. Discharge Planning:   -- Social work and case management to assist with discharge planning and identification of hospital follow-up needs prior to discharge -- Estimated LOS: 5-7 days -- Discharge Concerns: Need to establish a safety plan; Medication compliance and effectiveness -- Discharge Goals: Return home with outpatient referrals for mental health follow-up including medication management/psychotherapy  I have reviewed this case with Dr. Jadapalle who is agreeable with this plan.   Fay Hoop, PA-C 04/28/2024, 5:17 PM

## 2024-04-28 NOTE — Progress Notes (Signed)
 Pt came up to the nurses station agitated stating he was having racing thoughts and couldn't sleep. PRN medication given, check MAR

## 2024-04-28 NOTE — [facility] Counselor (Addendum)
  [name] met with the patient to discuss safe discharge planning.  The patient initially expressed a desire to be discharged to a friend's home but was unable to provide contact information for verification, stating that the friend does not have a phone.  [name] reviewed the criteria for a safe discharge, and the patient indicated understanding.  The patient confirmed that he still has access to his own residence and reported having communicated with his landlord during treatment. According to the patient, the landlord has given him permission to stay in the home until further notice.  The patient agreed to return to his residence upon discharge.  This has been communicated to team.   [name] team to continue to assess.   Dyna Figuereo, MSW, LCSWA 04/28/2024 3:41 PM

## 2024-04-28 NOTE — Progress Notes (Signed)
   04/28/24 1000  Psych Admission Type (Psych Patients Only)  Admission Status Voluntary  Psychosocial Assessment  Patient Complaints Hopelessness;Depression;Anxiety  Eye Contact Fair  Facial Expression Flat  Affect Flat  Speech Logical/coherent  Interaction Avoidant  Motor Activity Slow  Appearance/Hygiene Poor hygiene;In scrubs  Behavior Characteristics Calm  Mood Pleasant  Thought Process  Coherency WDL  Content WDL  Delusions WDL  Perception WDL  Hallucination None reported or observed  Judgment WDL  Confusion WDL  Danger to Self  Current suicidal ideation? Denies  Agreement Not to Harm Self Yes  Description of Agreement verbal  Danger to Others  Danger to Others None reported or observed

## 2024-04-28 NOTE — Group Note (Signed)
 [facility] LCSW Group Therapy Note   Group Date: 04/28/2024 Start Time: 1300 End Time: 1420   Type of Therapy/Topic:  Group Therapy:  Emotion Regulation  Participation Level:  Did Not Attend   Mood:  Description of Group:    The purpose of this group is to assist patients in learning to regulate negative emotions and experience positive emotions. Patients will be guided to discuss ways in which they have been vulnerable to their negative emotions. These vulnerabilities will be juxtaposed with experiences of positive emotions or situations, and patients challenged to use positive emotions to combat negative ones. Special emphasis will be placed on coping with negative emotions in conflict situations, and patients will process healthy conflict resolution skills.  Therapeutic Goals: Patient will identify two positive emotions or experiences to reflect on in order to balance out negative emotions:  Patient will label two or more emotions that they find the most difficult to experience:  Patient will be able to demonstrate positive conflict resolution skills through discussion or role plays:   Summary of Patient Progress:   Patient did not attend group.     Therapeutic Modalities:   Cognitive Behavioral Therapy Feelings Identification Dialectical Behavioral Therapy   Roselle Conner, LCSW

## 2024-04-28 NOTE — Progress Notes (Signed)
   04/28/24 1600  Clinical Opiate Withdrawal Scale (COWS)  Resting Pulse Rate 0  Sweating 0  Restlessness 0  Pupil Size 0  Bone or Joint Aches 0  Runny Nose or Tearing 0  GI Upset 0  Tremor 0  Yawning 0  Anxiety or Irritability 1  Gooseflesh Skin 0  COWS Total Score 1   Patient only endorses anxiety at this time. However, he does state that he feels as if his depression medication isn't working. He asked if this particular medication could be increased.

## 2024-04-28 NOTE — Group Note (Signed)
 Recreation Therapy Group Note   Group Topic:Goal Setting  Group Date: 04/28/2024 Start Time: 1000 End Time: 1055 Facilitators: Deatrice Factor, LRT, CTRS Location: Craft Room  Group Description: Product/process development scientist. Patients were given many different magazines, a glue stick, markers, and a piece of cardstock paper. LRT and pts discussed the importance of having goals in life. LRT and pts discussed the difference between short-term and long-term goals, as well as what a SMART goal is. LRT encouraged pts to create a vision board, with images they picked and then cut out with safety scissors from the magazine, for themselves, that capture their short and long-term goals. LRT encouraged pts to show and explain their vision board to the group.   Goal Area(s) Addressed:  Patient will gain knowledge of short vs. long term goals.  Patient will identify goals for themselves. Patient will practice setting SMART goals. Patient will verbalize their goals to LRT and peers.   Affect/Mood: N/A   Participation Level: Did not attend    Clinical Observations/Individualized Feedback: Patient did not attend group.   Plan: Continue to engage patient in RT group sessions 2-3x/week.   Deatrice Factor, LRT, CTRS 04/28/2024 1:11 PM

## 2024-04-28 NOTE — Group Note (Signed)
 Date:  04/28/2024 Time:  9:51 PM  Group Topic/Focus:  Wrap-Up Group:   The focus of this group is to help patients review their daily goal of treatment and discuss progress on daily workbooks.    Participation Level:  Active  Participation Quality:  Appropriate and Sharing  Affect:  Appropriate  Cognitive:  Appropriate  Insight: Appropriate  Engagement in Group:  Engaged  Modes of Intervention:  Discussion  Additional Comments:     Fabiola Holy 04/28/2024, 9:51 PM

## 2024-04-29 ENCOUNTER — Other Ambulatory Visit: Payer: Self-pay

## 2024-04-29 MED ORDER — HYDROXYZINE HCL 25 MG PO TABS
25.0000 mg | ORAL_TABLET | Freq: Four times a day (QID) | ORAL | Status: DC | PRN
  Administered 2024-04-29: 25 mg via ORAL
  Filled 2024-04-29: qty 1

## 2024-04-29 MED ORDER — QUETIAPINE FUMARATE 100 MG PO TABS
100.0000 mg | ORAL_TABLET | Freq: Every day | ORAL | 0 refills | Status: DC
  Filled 2024-04-29: qty 30, 30d supply, fill #0

## 2024-04-29 MED ORDER — ESCITALOPRAM OXALATE 10 MG PO TABS
10.0000 mg | ORAL_TABLET | Freq: Every day | ORAL | 0 refills | Status: DC
  Filled 2024-04-29: qty 30, 30d supply, fill #0

## 2024-04-29 NOTE — Group Note (Signed)
 Date:  04/29/2024 Time:  6:41 PM  Group Topic/Focus:  Making Healthy Choices:   The focus of this group is to help patients identify negative/unhealthy choices they were using prior to admission and identify positive/healthier coping strategies to replace them upon discharge.    Participation Level:  Did Not Attend   Mellie Sprinkle [facility] 04/29/2024, 6:41 PM

## 2024-04-29 NOTE — [facility] Suicide Risk Assessment (Signed)
 [facility] Discharge Suicide Risk Assessment   Principal Problem: MDD (major depressive disorder), recurrent episode, severe (HCC) Discharge Diagnoses: Principal Problem:   MDD (major depressive disorder), recurrent episode, severe (HCC)   Total Time spent with patient: 20 minutes  Musculoskeletal: Strength & Muscle Tone: within normal limits Gait & Station: normal Patient leans: N/A  Psychiatric Specialty Exam  Presentation  General Appearance:  Casual  Eye Contact: Good  Speech: Normal Rate  Speech Volume: Normal  Handedness: Right   Mood and Affect  Mood: Euthymic  Duration of Depression Symptoms: Greater than two weeks  Affect: Congruent   Thought Process  Thought Processes: Linear; Coherent  Descriptions of Associations:Intact  Orientation:Full (Time, Place and Person)  Thought Content:Logical  History of Schizophrenia/Schizoaffective disorder:No  Duration of Psychotic Symptoms:No data recorded Hallucinations:Hallucinations: None  Ideas of Reference:None  Suicidal Thoughts:Suicidal Thoughts: No  Homicidal Thoughts:Homicidal Thoughts: No   Sensorium  Memory: Immediate Fair  Judgment: Fair  Insight: Fair   Art therapist  Concentration: Fair  Attention Span: Good  Recall: Good  Fund of Knowledge: Good  Language: Good   Psychomotor Activity  Psychomotor Activity: Psychomotor Activity: Normal   Assets  Assets: Communication Skills; Desire for Improvement; Physical Health   Sleep  Sleep: Sleep: Fair   Physical Exam: Physical Exam ROS Blood pressure 110/88, pulse 65, temperature 98.2 F (36.8 C), resp. rate 16, height 5\' 4"  (1.626 m), weight 49.9 kg, SpO2 98%. Body mass index is 18.88 kg/m.  Mental Status Per Nursing Assessment::   On Admission:     Demographic Factors:  Male and Caucasian  Loss Factors: NA  Historical Factors: Substance use  Risk Reduction Factors:   Positive therapeutic  relationship and Positive coping skills or problem solving skills  Continued Clinical Symptoms:  Depression  Cognitive Features That Contribute To Risk:  None    Suicide Risk:  Minimal: No identifiable suicidal ideation.  Patients presenting with no risk factors but with morbid ruminations; may be classified as minimal risk based on the severity of the depressive symptoms   Follow-up Information     [facility]. Go to.   Why: In person assessment for therapy and medication management is 05/06/24 at 11 AM. Contact information: [address] [place] [address] [phone]                 Plan Of Care/Follow-up recommendations:  Activity:  as tolerated  I have reviewed this case with Dr. Jadapalle who is agreeable with this plan.   Fay Hoop, PA-C 04/29/2024, 3:54 PM

## 2024-04-29 NOTE — Group Note (Signed)
 Date:  04/29/2024 Time:  9:28 PM  Group Topic/Focus:  Stages of Change:   The focus of this group is to explain the stages of change and help patients identify changes they want to make upon discharge.    Participation Level:  Active  Participation Quality:  Appropriate and Attentive  Affect:  Appropriate  Cognitive:  Alert and Appropriate  Insight: Appropriate, Good, and Improving  Engagement in Group:  Developing/Improving, Engaged, and Supportive  Modes of Intervention:  Activity, Discussion, Rapport Building, Reality Testing, and Support  Additional Comments:     Anetria Harwick 04/29/2024, 9:28 PM

## 2024-04-29 NOTE — BH IP Treatment Plan (Signed)
 Interdisciplinary Treatment and Diagnostic Plan Update  04/29/2024 Time of Session: 9:00 AM Corey House. MRN: [id number]  Principal Diagnosis: MDD (major depressive disorder), recurrent episode, severe (HCC)  Secondary Diagnoses: Principal Problem:   MDD (major depressive disorder), recurrent episode, severe (HCC)   Current Medications:  Current Facility-Administered Medications  Medication Dose Route Frequency Provider Last Rate Last Admin   acetaminophen  (TYLENOL ) tablet 650 mg  650 mg Oral Q6H PRN Tingling, Stephanie, PA-C   650 mg at 04/29/24 0813   albuterol  (VENTOLIN  HFA) 108 (90 Base) MCG/ACT inhaler 2 puff  2 puff Inhalation Q6H PRN Tingling, Stephanie, PA-C       alum & mag hydroxide-simeth (MAALOX/MYLANTA) 200-200-20 MG/5ML suspension 30 mL  30 mL Oral Q4H PRN Tingling, Stephanie, PA-C       cloNIDine  (CATAPRES ) tablet 0.1 mg  0.1 mg Oral QAC breakfast Tingling, Stephanie, PA-C   0.1 mg at 04/29/24 6962   dicyclomine  (BENTYL ) tablet 20 mg  20 mg Oral Q6H PRN Tingling, Stephanie, PA-C       haloperidol  (HALDOL ) tablet 5 mg  5 mg Oral TID PRN Tingling, Stephanie, PA-C   5 mg at 04/28/24 2341   And   diphenhydrAMINE  (BENADRYL ) capsule 50 mg  50 mg Oral TID PRN Tingling, Stephanie, PA-C   50 mg at 04/28/24 2341   haloperidol  lactate (HALDOL ) injection 5 mg  5 mg Intramuscular TID PRN Tingling, Stephanie, PA-C       And   diphenhydrAMINE  (BENADRYL ) injection 50 mg  50 mg Intramuscular TID PRN Tingling, Stephanie, PA-C       And   LORazepam  (ATIVAN ) injection 2 mg  2 mg Intramuscular TID PRN Tingling, Stephanie, PA-C       haloperidol  lactate (HALDOL ) injection 10 mg  10 mg Intramuscular TID PRN Tingling, Stephanie, PA-C       And   diphenhydrAMINE  (BENADRYL ) injection 50 mg  50 mg Intramuscular TID PRN Tingling, Stephanie, PA-C       And   LORazepam  (ATIVAN ) injection 2 mg  2 mg Intramuscular TID PRN Tingling, Stephanie, PA-C       escitalopram  (LEXAPRO ) tablet 10 mg  10  mg Oral Daily Tingling, Stephanie, PA-C   10 mg at 04/29/24 0813   feeding supplement (BOOST / RESOURCE BREEZE) liquid 1 Container  1 Container Oral TID BM Tingling, Stephanie, PA-C       hydrOXYzine  (ATARAX ) tablet 25 mg  25 mg Oral Q6H PRN Tingling, Stephanie, PA-C   25 mg at 04/29/24 9528   loperamide  (IMODIUM ) capsule 2-4 mg  2-4 mg Oral PRN Tingling, Stephanie, PA-C       magnesium  hydroxide (MILK OF MAGNESIA) suspension 30 mL  30 mL Oral Daily PRN Tingling, Stephanie, PA-C       methocarbamol  (ROBAXIN ) tablet 500 mg  500 mg Oral Q8H PRN Tingling, Stephanie, PA-C   500 mg at 04/28/24 2056   multivitamin with minerals tablet 1 tablet  1 tablet Oral Daily Tingling, Stephanie, PA-C   1 tablet at 04/29/24 0813   naproxen  (NAPROSYN ) tablet 500 mg  500 mg Oral BID PRN Tingling, Stephanie, PA-C   500 mg at 04/28/24 1529   nicotine  (NICODERM CQ  - dosed in mg/24 hours) patch 14 mg  14 mg Transdermal Daily Tingling, Stephanie, PA-C   14 mg at 04/29/24 0813   ondansetron  (ZOFRAN -ODT) disintegrating tablet 4 mg  4 mg Oral Q6H PRN Tingling, Stephanie, PA-C   4 mg at 04/26/24 0840   QUEtiapine  (SEROQUEL ) tablet  100 mg  100 mg Oral QHS Millington, Matthew E, PA-C   100 mg at 04/28/24 2056   PTA Medications: Medications Prior to Admission  Medication Sig Dispense Refill Last Dose/Taking   albuterol  (VENTOLIN  HFA) 108 (90 Base) MCG/ACT inhaler Inhale 2 puffs into the lungs every 6 (six) hours as needed for wheezing or shortness of breath. 8 g 2    albuterol  (VENTOLIN  HFA) 108 (90 Base) MCG/ACT inhaler Inhale 2 puffs into the lungs every 6 (six) hours. 1530 g 0    cloNIDine  (CATAPRES ) 0.1 MG tablet Take 1 tablet (0.1 mg total) by mouth 2 (two) times daily. 60 tablet 11    escitalopram  (LEXAPRO ) 20 MG tablet Take 1 tablet (20 mg total) by mouth daily for 3 days. 3 tablet 0    mometasone -formoterol  (DULERA ) 100-5 MCG/ACT AERO Inhale 2 puffs into the lungs 2 (two) times daily. 39 g 1    mometasone -formoterol   (DULERA ) 100-5 MCG/ACT AERO Inhale 2 puffs into the lungs 2 (two) times daily. 1 each 0    QUEtiapine  (SEROQUEL ) 50 MG tablet Take 1 tablet (50 mg total) by mouth at bedtime. 30 tablet 0    traZODone  (DESYREL ) 100 MG tablet Take 1 tablet (100 mg total) by mouth at bedtime. 30 tablet 0     Patient Stressors:    Patient Strengths:    Treatment Modalities: Medication Management, Group therapy, Case management,  1 to 1 session with clinician, Psychoeducation, Recreational therapy.   Physician Treatment Plan for Primary Diagnosis: MDD (major depressive disorder), recurrent episode, severe (HCC) Long Term Goal(s):     Short Term Goals:    Medication Management: Evaluate patient's response, side effects, and tolerance of medication regimen.  Therapeutic Interventions: 1 to 1 sessions, Unit Group sessions and Medication administration.  Evaluation of Outcomes: Not Met  Physician Treatment Plan for Secondary Diagnosis: Principal Problem:   MDD (major depressive disorder), recurrent episode, severe (HCC)  Long Term Goal(s):     Short Term Goals:       Medication Management: Evaluate patient's response, side effects, and tolerance of medication regimen.  Therapeutic Interventions: 1 to 1 sessions, Unit Group sessions and Medication administration.  Evaluation of Outcomes: Not Met   RN Treatment Plan for Primary Diagnosis: MDD (major depressive disorder), recurrent episode, severe (HCC) Long Term Goal(s): Knowledge of disease and therapeutic regimen to maintain health will improve  Short Term Goals: Ability to demonstrate self-control, Ability to participate in decision making will improve, Ability to verbalize feelings will improve, Ability to disclose and discuss suicidal ideas, Ability to identify and develop effective coping behaviors will improve, and Compliance with prescribed medications will improve   Medication Management: RN will administer medications as ordered by provider,  will assess and evaluate patient's response and provide education to patient for prescribed medication. RN will report any adverse and/or side effects to prescribing provider.  Therapeutic Interventions: 1 on 1 counseling sessions, Psychoeducation, Medication administration, Evaluate responses to treatment, Monitor vital signs and CBGs as ordered, Perform/monitor CIWA, COWS, AIMS and Fall Risk screenings as ordered, Perform wound care treatments as ordered.  Evaluation of Outcomes: Not Met   LCSW Treatment Plan for Primary Diagnosis: MDD (major depressive disorder), recurrent episode, severe (HCC) Long Term Goal(s): Safe transition to appropriate next level of care at discharge, Engage patient in therapeutic group addressing interpersonal concerns.  Short Term Goals: Engage patient in aftercare planning with referrals and resources, Increase social support, Increase ability to appropriately verbalize feelings, Increase emotional regulation, Facilitate acceptance  of mental health diagnosis and concerns, Facilitate patient progression through stages of change regarding substance use diagnoses and concerns, Identify triggers associated with mental health/substance abuse issues, and Increase skills for wellness and recovery   Therapeutic Interventions: Assess for all discharge needs, 1 to 1 time with Social worker, Explore available resources and support systems, Assess for adequacy in community support network, Educate family and significant other(s) on suicide prevention, Complete Psychosocial Assessment, Interpersonal group therapy.  Evaluation of Outcomes: Not Met   Progress in Treatment: Attending groups: No. 04/29/24 Update: No.  Participating in groups: No. 04/29/24 Update: No.  Taking medication as prescribed: Yes. 04/29/24 Update: Yes.  Toleration medication: Yes.04/29/24 Update: Yes.  Family/Significant other contact made: No, will contact:  once permission has been granted 04/29/24  Update:  No, will contact: Patient Refusal for Family/Significant Other Suicide Prevention Education: The patient Farzad Avitabile. has refused to provide written consent for family/significant other to be provided Family/Significant Other Suicide Prevention Education during admission and/or prior to discharge.  Physician notified.   SPE completed with pt, as pt refused to consent to family contact. SPI pamphlet provided to pt and pt was encouraged to share information with support network, ask questions, and talk about any concerns relating to SPE. Pt denies access to guns/firearms and verbalized understanding of information provided. Mobile Crisis information also provided to pt.     Patient understands diagnosis: Yes. 04/29/24 Update: Yes.  Discussing patient identified problems/goals with staff: Yes. 04/29/24 Update: Yes.  Medical problems stabilized or resolved: Yes. 04/29/24 Update: Yes.  Denies suicidal/homicidal ideation: No. 04/29/24 Update: No.  Issues/concerns per patient self-inventory: No. 04/29/24 Update: No.  Other: none 04/29/24 Update: None.   New problem(s) identified: No, Describe:  none 04/29/24 Update: None.   New Short Term/Long Term Goal(s): detox, elimination of symptoms of psychosis, medication management for mood stabilization; elimination of SI thoughts; development of comprehensive mental wellness/sobriety plan.   Patient Goals:   "get better"  04/29/24 Update: Goal to remain the same.   Discharge Plan or Barriers: [name] to assist patient in development of appropriate treatment plans. Patient reports that he is currently homeless. 04/29/24 Update: Patient has decided to live with a friend but was unable to provide contact information for friend. Patient will discharge to his home as the patient reports that the landlord has agreed that he may return. Patient has been set up with [facility] for therapy and medication management as patient had reported difficulties navigating his  medications prior to admission.   Reason for Continuation of Hospitalization: Anxiety Depression Medication stabilization Suicidal ideation Withdrawal symptoms  Estimated Length of Stay:  1-7 days  04/29/24 Update: Pending 04/30/24   Last 3 [place] Suicide Severity Risk Score: Flowsheet Row Admission (Current) from 04/23/2024 in [facility]PATIENT BE[facility]AL MEDICINE ED from 04/22/2024 in [facility] at [facility] Admission (Discharged) from 12/27/2023 in [facility] INPATIENT BEH[facility]AL MEDICINE  C-SSRS RISK CATEGORY Low Risk High Risk High Risk       Last PHQ 2/9 Scores:     No data to display          Scribe for Treatment Team: Shriyan Arakawa M Lachlyn Vanderstelt, LCSW 04/29/2024 1:24 PM

## 2024-04-29 NOTE — Group Note (Signed)
 [facility] LCSW Group Therapy Note   Group Date: 04/29/2024 Start Time: 1300 End Time: 1415   Type of Therapy/Topic:  Group Therapy:  Emotion Regulation  Participation Level:  Did Not Attend   Mood:  Description of Group:    The purpose of this group is to assist patients in learning to regulate negative emotions and experience positive emotions. Patients will be guided to discuss ways in which they have been vulnerable to their negative emotions. These vulnerabilities will be juxtaposed with experiences of positive emotions or situations, and patients challenged to use positive emotions to combat negative ones. Special emphasis will be placed on coping with negative emotions in conflict situations, and patients will process healthy conflict resolution skills.  Therapeutic Goals: Patient will identify two positive emotions or experiences to reflect on in order to balance out negative emotions:  Patient will label two or more emotions that they find the most difficult to experience:  Patient will be able to demonstrate positive conflict resolution skills through discussion or role plays:   Summary of Patient Progress:   X    Therapeutic Modalities:   Cognitive Behavioral Therapy Feelings Identification Dialectical Behavioral Therapy   Larri Ply, LCSW

## 2024-04-29 NOTE — Progress Notes (Signed)
   04/29/24 1100  Psych Admission Type (Psych Patients Only)  Admission Status Voluntary  Psychosocial Assessment  Patient Complaints Anxiety;Depression (dep 5/10 anx 9/10)  Eye Contact Fair  Facial Expression Flat  Affect Blunted  Speech Logical/coherent  Interaction Assertive  Motor Activity Other (Comment) (WNL)  Appearance/Hygiene Unremarkable  Behavior Characteristics Cooperative  Mood Pleasant  Thought Process  Coherency WDL  Content WDL  Delusions None reported or observed  Perception WDL  Hallucination None reported or observed  Judgment Impaired  Confusion WDL  Danger to Self  Current suicidal ideation? Denies  Agreement Not to Harm Self Yes  Description of Agreement verbal

## 2024-04-29 NOTE — Progress Notes (Signed)
 [facility] MD Progress Note  04/29/2024 3:39 PM Corey House.  MRN:  [id number]  58 year old Caucasian male with reported history of MDD, opioid use disorder, noncompliance with medication regimen voluntarily presented to [facility] ED on 5/7 with chief complaints of no desire to live anymore, feeling anxious and shaky all the time. Reported depressed mood, lack of motivation, impaired sleep and appetite for several months, having difficulty finding employment, endorsed suicidal ideation with thoughts of hanging himself. Was admitted here at [facility] inpatient [facility] unit in January 2025, for similar complaints, was stabilized on Lexapro , Seroquel , clonidine  and trazodone . UDS negative, BAL less than 15. QTc 429.   Subjective: Patient case was discussed with multidisciplinary team vitals and notes reviewed.  No behavioral issues overnight.  Patient did note some racing thoughts overnight intermittently did as needed medication Haldol  for this.  He is seen today for reassessment he is alert and oriented x 4 he was pleasant and cooperative with exam.  Continued to deny SI, HI and AVH.  Notes he was able to get some sleep.  Notes stable appetite.  Demonstrates good insight into the need for ongoing medication management and outpatient follow-up.  Plans to follow-up with [facility] and is interested in starting on Suboxone .  Reports he has an appointment with [facility].  He is requesting to go home today or tomorrow indicates that he will need his meds at discharge reports that was a problem on previous discharge that he did not have his meds and did not have the means to get them.  Continue to encourage patient to participate in group milieu.  Continues to indicate he is tolerating medications well and denies current symptoms of withdrawal.  He voices no further concerns or complaints at this time.     Principal Problem: MDD (major depressive disorder), recurrent episode, severe (HCC) Diagnosis: Principal Problem:   MDD (major depressive  disorder), recurrent episode, severe (HCC)  Total Time spent with patient: 20 minutes  Past Psychiatric History: See H&P  Past Medical History:  Past Medical History:  Diagnosis Date   Chronic cough    Urolithiasis    Urolithiasis     Past Surgical History:  Procedure Laterality Date   CATARACT EXTRACTION W/PHACO Left 08/09/2021   Procedure: CATARACT EXTRACTION PHACO AND INTRAOCULAR LENS PLACEMENT (IOC) LEFT .051 00:10.0;  Surgeon: Annell Kidney, MD;  Location: [facility];  Service: Ophthalmology;  Laterality: Left;   CATARACT EXTRACTION W/PHACO Right 10/04/2021   Procedure: CATARACT EXTRACTION PHACO AND INTRAOCULAR LENS PLACEMENT (IOC) RIGHT 0.67 00:13.8;  Surgeon: Annell Kidney, MD;  Location: [facility];  Service: Ophthalmology;  Laterality: Right;  general anesthesia needs to be last case   HERNIA REPAIR     x2   MANDIBLE FRACTURE SURGERY     Family History: History reviewed. No pertinent family history. Family Psychiatric  History: See H&P Social History:  Social History   Substance and Sexual Activity  Alcohol Use No     Social History   Substance and Sexual Activity  Drug Use Not Currently    Social History   Socioeconomic History   Marital status: Single    Spouse name: Not on file   Number of children: Not on file   Years of education: Not on file   Highest education level: Not on file  Occupational History   Not on file  Tobacco Use   Smoking status: Every Day    Current packs/day: 1.00    Average packs/day: 1 pack/day for  35.0 years (35.0 ttl pk-yrs)    Types: Cigarettes   Smokeless tobacco: Never   Tobacco comments:    Started smoking age 51  Vaping Use   Vaping status: Never Used  Substance and Sexual Activity   Alcohol use: No   Drug use: Not Currently   Sexual activity: Not on file  Other Topics Concern   Not on file  Social History Narrative   Not on file   Social Drivers of Health   Financial Resource  Strain: Not on file  Food Insecurity: Food Insecurity Present (04/23/2024)   Hunger Vital Sign    Worried About Running Out of Food in the Last Year: Sometimes true    Ran Out of Food in the Last Year: Often true  Transportation Needs: Unmet Transportation Needs (04/23/2024)   PRAPARE - Administrator, Civil Service (Medical): Yes    Lack of Transportation (Non-Medical): Yes  Physical Activity: Not on file  Stress: Not on file  Social Connections: Unknown (04/23/2024)   Social Connection and Isolation Panel [NHANES]    Frequency of Communication with Friends and Family: Twice a week    Frequency of Social Gatherings with Friends and Family: Patient declined    Attends Religious Services: Patient declined    Database administrator or Organizations: Patient declined    Attends Engineer, structural: Patient declined    Marital Status: Patient declined   Additional Social History:                         Sleep: Good  Appetite:  Good  Current Medications: Current Facility-Administered Medications  Medication Dose Route Frequency Provider Last Rate Last Admin   acetaminophen  (TYLENOL ) tablet 650 mg  650 mg Oral Q6H PRN Tingling, Stephanie, PA-C   650 mg at 04/29/24 1517   albuterol  (VENTOLIN  HFA) 108 (90 Base) MCG/ACT inhaler 2 puff  2 puff Inhalation Q6H PRN Tingling, Stephanie, PA-C       alum & mag hydroxide-simeth (MAALOX/MYLANTA) 200-200-20 MG/5ML suspension 30 mL  30 mL Oral Q4H PRN Tingling, Stephanie, PA-C       cloNIDine  (CATAPRES ) tablet 0.1 mg  0.1 mg Oral QAC breakfast Tingling, Stephanie, PA-C   0.1 mg at 04/29/24 6045   dicyclomine  (BENTYL ) tablet 20 mg  20 mg Oral Q6H PRN Tingling, Stephanie, PA-C       haloperidol  (HALDOL ) tablet 5 mg  5 mg Oral TID PRN Tingling, Stephanie, PA-C   5 mg at 04/28/24 2341   And   diphenhydrAMINE  (BENADRYL ) capsule 50 mg  50 mg Oral TID PRN Tingling, Stephanie, PA-C   50 mg at 04/28/24 2341   haloperidol  lactate  (HALDOL ) injection 5 mg  5 mg Intramuscular TID PRN Tingling, Stephanie, PA-C       And   diphenhydrAMINE  (BENADRYL ) injection 50 mg  50 mg Intramuscular TID PRN Tingling, Stephanie, PA-C       And   LORazepam  (ATIVAN ) injection 2 mg  2 mg Intramuscular TID PRN Tingling, Trevor Fudge, PA-C       haloperidol  lactate (HALDOL ) injection 10 mg  10 mg Intramuscular TID PRN Tingling, Stephanie, PA-C       And   diphenhydrAMINE  (BENADRYL ) injection 50 mg  50 mg Intramuscular TID PRN Tingling, Stephanie, PA-C       And   LORazepam  (ATIVAN ) injection 2 mg  2 mg Intramuscular TID PRN Tingling, Stephanie, PA-C       escitalopram  (LEXAPRO )  tablet 10 mg  10 mg Oral Daily Tingling, Stephanie, PA-C   10 mg at 04/29/24 0813   feeding supplement (BOOST / RESOURCE BREEZE) liquid 1 Container  1 Container Oral TID BM Tingling, Stephanie, PA-C       hydrOXYzine  (ATARAX ) tablet 25 mg  25 mg Oral Q6H PRN Tingling, Stephanie, PA-C   25 mg at 04/29/24 0814   loperamide  (IMODIUM ) capsule 2-4 mg  2-4 mg Oral PRN Tingling, Trevor Fudge, PA-C       magnesium  hydroxide (MILK OF MAGNESIA) suspension 30 mL  30 mL Oral Daily PRN Tingling, Stephanie, PA-C       methocarbamol  (ROBAXIN ) tablet 500 mg  500 mg Oral Q8H PRN Tingling, Stephanie, PA-C   500 mg at 04/28/24 2056   multivitamin with minerals tablet 1 tablet  1 tablet Oral Daily Tingling, Stephanie, PA-C   1 tablet at 04/29/24 0813   naproxen  (NAPROSYN ) tablet 500 mg  500 mg Oral BID PRN Tingling, Stephanie, PA-C   500 mg at 04/28/24 1529   nicotine  (NICODERM CQ  - dosed in mg/24 hours) patch 14 mg  14 mg Transdermal Daily Tingling, Stephanie, PA-C   14 mg at 04/29/24 0813   ondansetron  (ZOFRAN -ODT) disintegrating tablet 4 mg  4 mg Oral Q6H PRN Tingling, Stephanie, PA-C   4 mg at 04/26/24 0840   QUEtiapine  (SEROQUEL ) tablet 100 mg  100 mg Oral QHS Nunzio Banet E, PA-C   100 mg at 04/28/24 2056    Lab Results: No results found for this or any previous visit (from the past  48 hours).  Blood Alcohol level:  Lab Results  Component Value Date   [facility] <15 04/22/2024   ETH <10 12/27/2023    Metabolic Disorder Labs: Lab Results  Component Value Date   HGBA1C 6.1 (H) 12/29/2023   MPG 128.37 12/29/2023   No results found for: "PROLACTIN" Lab Results  Component Value Date   CHOL 155 12/29/2023   TRIG 114 12/29/2023   HDL 31 (L) 12/29/2023   CHOLHDL 5.0 12/29/2023   VLDL 23 12/29/2023   LDLCALC 101 (H) 12/29/2023    Physical Findings: AIMS:  , ,  ,  ,    CIWA:    COWS:  COWS Total Score: 0  Musculoskeletal: Strength & Muscle Tone: within normal limits Gait & Station: normal Patient leans: N/A  Psychiatric Specialty Exam:  Presentation  General Appearance:  Casual  Eye Contact: Good  Speech: Normal Rate  Speech Volume: Normal  Handedness: Right   Mood and Affect  Mood: Euthymic  Affect: Congruent   Thought Process  Thought Processes: Linear; Coherent  Descriptions of Associations:Intact  Orientation:Full (Time, Place and Person)  Thought Content:Logical  History of Schizophrenia/Schizoaffective disorder:No  Duration of Psychotic Symptoms:No data recorded Hallucinations:Hallucinations: None  Ideas of Reference:None  Suicidal Thoughts:Suicidal Thoughts: No  Homicidal Thoughts:Homicidal Thoughts: No   Sensorium  Memory: Immediate Fair  Judgment: Fair  Insight: Fair   Art therapist  Concentration: Fair  Attention Span: Good  Recall: Good  Fund of Knowledge: Good  Language: Good   Psychomotor Activity  Psychomotor Activity: Psychomotor Activity: Normal   Assets  Assets: Communication Skills; Desire for Improvement; Physical Health   Sleep  Sleep: Sleep: Fair    Physical Exam: Physical Exam Constitutional:      Appearance: He is normal weight.  HENT:     Head: Normocephalic and atraumatic.  Eyes:     Extraocular Movements: Extraocular movements intact.   Pulmonary:     Effort:  Pulmonary effort is normal.  Neurological:     Mental Status: He is alert.  Psychiatric:        Behavior: Behavior normal.    Review of Systems  Constitutional:  Positive for malaise/fatigue.  Respiratory:  Negative for cough.   Psychiatric/Behavioral:  Positive for depression. Negative for hallucinations and suicidal ideas. The patient does not have insomnia.   All other systems reviewed and are negative.  Blood pressure 110/88, pulse 65, temperature 98.2 F (36.8 C), resp. rate 16, height 5\' 4"  (1.626 m), weight 49.9 kg, SpO2 98%. Body mass index is 18.88 kg/m.   Treatment Plan Summary: MDD (major depressive disorder), recurrent episode, severe (HCC) 1.    Safety and Monitoring:   --  Voluntary admission to inpatient psychiatric unit for safety, stabilization and treatment -- Daily contact with patient to assess and evaluate symptoms and progress in treatment -- Patient's case to be discussed in multi-disciplinary team meeting -- Observation Level : q15 minute checks -- Vital signs:  q12 hours -- Precautions: suicide   2. Psychiatric Diagnoses and Treatment:  Patient continues to show improvement.  He continues to demonstrate [employer] to the need for outpatient follow-up.  He continues to deny SI, HI and AVH.  He is medication compliant.  He is set to follow-up with [facility] and request to go tomorrow.  He reports medications are provided at time of discharge.    -- Continue clonidine  detox protocol, COWS protocol -- Continue Lexapro  10 mg daily for MDD/anxiety -- Continue Seroquel  to 100 mg at bedtime for mood and sleep   --  The risks/benefits/side-effects/alternatives to this medication were discussed in detail with the patient and time was given for questions. The patient consents to medication trial. -- Metabolic profile and EKG monitoring obtained while on an atypical antipsychotic  -- Encouraged patient to participate in unit milieu and in scheduled  group therapies -- Short Term Goals: Ability to identify changes in lifestyle to reduce recurrence of condition will improve, Ability to verbalize feelings will improve, Ability to disclose and discuss suicidal ideas, Ability to demonstrate self-control will improve, Ability to identify and develop effective coping behaviors will improve, Ability to maintain clinical measurements within normal limits will improve, Compliance with prescribed medications will improve, and Ability to identify triggers associated with substance abuse/mental health issues will improve -- Long Term Goals: Improvement in symptoms so as ready for discharge        3. Medical Issues Being Addressed:               COPD, restart home meds    4. Discharge Planning:   -- Social work and case management to assist with discharge planning and identification of hospital follow-up needs prior to discharge -- Estimated LOS: 5-7 days -- Discharge Concerns: Need to establish a safety plan; Medication compliance and effectiveness -- Discharge Goals: Return home with outpatient referrals for mental health follow-up including medication management/psychotherapy  I have reviewed this case with Dr. Jadapalle who is agreeable with this plan.   Fay Hoop, PA-C 04/29/2024, 3:39 PM

## 2024-04-29 NOTE — Plan of Care (Signed)
   Problem: Education: Goal: Emotional status will improve Outcome: Progressing Goal: Mental status will improve Outcome: Progressing

## 2024-04-29 NOTE — Plan of Care (Signed)

## 2024-04-30 NOTE — Progress Notes (Signed)
  [facility] Adult Case Management Discharge Plan :  Will you be returning to the same living situation after discharge:  Yes,  pt plans to return home upon discharge.  At discharge, do you have transportation home?: Yes,  pt provided with taxi voucher.  Do you have the ability to pay for your medications: Yes,  [place] MEDICAID PREPAID HEALTH PLAN / [place] MEDICAID HEALTHY BLUE.  Release of information consent forms completed and in the chart;  Patient's signature needed at discharge.  Patient to Follow up at:  Follow-up Information     [facility]. Go to.   Why: In person assessment for therapy and medication management is 05/06/24 at 11 AM. Contact information: [address] [place] [address] [phone]                 Next level of care provider has access to [facility] Link:no  Safety Planning and Suicide Prevention discussed: Yes,  SPE completed with pt.      Has patient been referred to the Quitline?: Patient refused referral for treatment  Patient has been referred for addiction treatment: No known substance use disorder.  Randolm Butte, LCSW 04/30/2024, 9:21 AM

## 2024-04-30 NOTE — Group Note (Signed)
 Date:  04/30/2024 Time:  9:58 AM  Group Topic/Focus:  Goals Group:   The focus of this group is to help patients establish daily goals to achieve during treatment and discuss how the patient can incorporate goal setting into their daily lives to aide in recovery.   Participation Level:  Active  Participation Quality:  Appropriate  Affect:  Appropriate  Cognitive:  Appropriate  Insight: Appropriate  Engagement in Group:  Engaged  Modes of Intervention:  Discussion and Education  Additional Comments:    Ariaunna Longsworth A Hieu Herms 04/30/2024, 9:58 AM

## 2024-04-30 NOTE — Progress Notes (Signed)
 Patient is discharging at this time. Patient is A&Ox4. Stable. Patient denies SI,HI, and A/V/H with no plan/intent. Printed AVS reviewed with and given to patient along with medications and follow up appointments. Suicide safety plan complete with copy provided to patient. Original form in assigned binder. Patient verbalized all understanding. All valuables/belongings returned to patient. Patient is being transported by taxi. Patient denies any pain/discomfort. No s/s of current distress.

## 2024-04-30 NOTE — Discharge Summary (Signed)
 Physician Discharge Summary Note  Patient:  Corey House. is an 58 y.o., male MRN:  [id number] DOB:  24-Dec-1965 Patient phone:  [phone] (home)  Patient address:   [address] [place] [address],    Date of Admission:  04/23/2024 Date of Discharge: 04/30/24  Reason for Admission:  no desire to live anymore, feeling anxious and shaky all the time. Reported depressed mood, lack of motivation, impaired sleep and appetite for several months, having difficulty finding employment, endorsed suicidal ideation with thoughts of hanging himself. Was [facility] unit in January 2025, for similar complaints, was stabilized on Lexapro , Seroquel , clonidine  and trazodone . UDS negative, BAL less than 15. QTc 429.   Principal Problem: MDD (major depressive disorder), recurrent episode, severe (HCC) Discharge Diagnoses: Principal Problem:   MDD (major depressive disorder), recurrent episode, severe (HCC)   Past Psychiatric History:  Information collected from patient and chart review   Prev Dx/Sx: depression, polysubstance abuse Current Psych Provider: none Home Meds (current): none x 2week  Previous Med Trials: Clonidine , Lexapro , Trazodone , Seroquel   Therapy: denies Family Psychiatric  History: none Social History:  Social History   Substance and Sexual Activity  Alcohol Use No     Social History   Substance and Sexual Activity  Drug Use Not Currently    Social History   Socioeconomic History   Marital status: Single    Spouse name: Not on file   Number of children: Not on file   Years of education: Not on file   Highest education level: Not on file  Occupational History   Not on file  Tobacco Use   Smoking status: Every Day    Current packs/day: 1.00    Average packs/day: 1 pack/day for 35.0 years (35.0 ttl pk-yrs)    Types: Cigarettes   Smokeless tobacco: Never   Tobacco comments:    Started smoking age 34  Vaping Use   Vaping status:  Never Used  Substance and Sexual Activity   Alcohol use: No   Drug use: Not Currently   Sexual activity: Not on file  Other Topics Concern   Not on file  Social History Narrative   Not on file   Social Drivers of Health   Financial Resource Strain: Not on file  Food Insecurity: Food Insecurity Present (04/23/2024)   Hunger Vital Sign    Worried About Running Out of Food in the Last Year: Sometimes true    Ran Out of Food in the Last Year: Often true  Transportation Needs: Unmet Transportation Needs (04/23/2024)   PRAPARE - Administrator, Civil Service (Medical): Yes    Lack of Transportation (Non-Medical): Yes  Physical Activity: Not on file  Stress: Not on file  Social Connections: Unknown (04/23/2024)   Social Connection and Isolation Panel [NHANES]    Frequency of Communication with Friends and Family: Twice a week    Frequency of Social Gatherings with Friends and Family: Patient declined    Attends Religious Services: Patient declined    Database administrator or Organizations: Patient declined    Attends Banker Meetings: Patient declined    Marital Status: Patient declined   Past Medical History:  Past Medical History:  Diagnosis Date   Chronic cough    Urolithiasis    Urolithiasis     Past Surgical History:  Procedure Laterality Date   CATARACT EXTRACTION W/PHACO Left 08/09/2021   Procedure: CATARACT EXTRACTION PHACO AND INTRAOCULAR LENS  PLACEMENT (IOC) LEFT .051 00:10.0;  Surgeon: Annell Kidney, MD;  Location: [facility];  Service: Ophthalmology;  Laterality: Left;   CATARACT EXTRACTION W/PHACO Right 10/04/2021   Procedure: CATARACT EXTRACTION PHACO AND INTRAOCULAR LENS PLACEMENT (IOC) RIGHT 0.67 00:13.8;  Surgeon: Annell Kidney, MD;  Location: [facility];  Service: Ophthalmology;  Laterality: Right;  general anesthesia needs to be last case   HERNIA REPAIR     x2   MANDIBLE FRACTURE SURGERY     Family  History: History reviewed. No pertinent family history.  Hospital Course:  During the course of hospitalization, pt received daily multiple modalities of treatments consisting of Psychopharmacology, individual, group, psychoeducational, recreational, milieu therapy, including case management to coordinate pts inpatient and outpatient care and in concert with weekly treatment team meetings. Discharge planning was initiated on the day of admission to ensure a safe discharge. The presenting symptoms were closely monitored and medications were started as indicated. There were no complications. Patient tolerated clonidine  well and declined prescription at discharge indicating that he plan to follow-up for Suboxone  treatment.  He tolerated Lexapro  well and was continued on 10 mg.  Seroquel  was titrated to 100 mg for sleep and mood.  Patient demonstrated good insight into need for outpatient follow-up and medication compliance. Medications addressing the principal problem were initiated with improvement in severity sufficient to discharge to a lower level of care.   It is intended for the outpatient provider to determine whether to continue these medications, or if these medication needs to be titrated for continued outpatient therapy. All identified psychiatric, general medical/surgical psychosocial obstacles to discharge were addressed. Patient tolerated these medications with no noted side effects. All these medications were titrated to discharge levels (Please see discharge medications below). Patient showed sustained symptomatic improvement before discharge. The patient denied suicidal, homicidal ideations and hallucinations. Collateral contacted to determine baseline behaviors and for safe discharge plan.   On the day of discharge, following sustained improvement in the affect of this patient, continued report of euthymic mood, repeated denial of suicidal, homicidal and other violent ideations, adequate  interaction with peers, active participation in groups while on the unit, and denial of adverse reactions from the medications, the treatment team decided that Pt was stable for discharge with scheduled mental health treatment as below. A comprehensive risk assessment was done prior to discharge and shows that patient is at low risk for suicide or violence and will continue to be if patient complies with the treatment recommendations, medications and therapy. At the time of discharge, patient no longer meeting criteria for IVC, patient is not an imminent danger to self or others. patient agrees to call Crisis Services, 911 and/or return to the ED if safety cannot be maintained outside the hospital setting. Discharge medications reviewed with patient, explanation of indication, risks/benefits and side effects profiles. The patient verbalized understanding and is in agreement with the discharge plan.    Physical Findings: AIMS: Facial and Oral Movements Muscles of Facial Expression: None Lips and Perioral Area: None Jaw: None Tongue: None,Extremity Movements Upper (arms, wrists, hands, fingers): None Lower (legs, knees, ankles, toes): None, Trunk Movements Neck, shoulders, hips: None, Global Judgements Severity of abnormal movements overall : None Incapacitation due to abnormal movements: None Patient's awareness of abnormal movements: No Awareness, Dental Status Current problems with teeth and/or dentures?: Yes Does patient usually wear dentures?: No Edentia?: No  CIWA:    COWS:  COWS Total Score: 2     Psychiatric Specialty Exam:  Presentation  General Appearance:  Casual  Eye Contact: Good  Speech: Normal Rate  Speech Volume: Normal    Mood and Affect  Mood: Euthymic  Affect: Congruent   Thought Process  Thought Processes: Coherent; Linear  Descriptions of Associations:Intact  Orientation:Full (Time, Place and Person)  Thought  Content:Logical  Hallucinations:Hallucinations: None  Ideas of Reference:None  Suicidal Thoughts:Suicidal Thoughts: No  Homicidal Thoughts:Homicidal Thoughts: No   Sensorium  Memory: Immediate Good; Remote Good; Recent Good  Judgment: Good  Insight: Good   Executive Functions  Concentration: Fair  Attention Span: Good  Recall: Good  Fund of Knowledge: Good  Language: Good   Psychomotor Activity  Psychomotor Activity: Psychomotor Activity: Normal  Musculoskeletal: Strength & Muscle Tone: within normal limits Gait & Station: normal Assets  Assets: Manufacturing systems engineer; Desire for Improvement; Physical Health   Sleep  Sleep: Sleep: Fair    Physical Exam: Physical Exam Constitutional:      Appearance: Normal appearance.  HENT:     Head: Normocephalic.  Eyes:     Extraocular Movements: Extraocular movements intact.  Pulmonary:     Effort: Pulmonary effort is normal.  Neurological:     Mental Status: He is alert and oriented to person, place, and time. Mental status is at baseline.  Psychiatric:        Mood and Affect: Mood normal.        Behavior: Behavior normal.        Thought Content: Thought content normal.        Judgment: Judgment normal.    Review of Systems  Psychiatric/Behavioral:  Negative for hallucinations and suicidal ideas.    Blood pressure (!) 123/97, pulse 72, temperature 97.9 F (36.6 C), resp. rate 14, height 5\' 4"  (1.626 m), weight 49.9 kg, SpO2 99%. Body mass index is 18.88 kg/m.   Social History   Tobacco Use  Smoking Status Every Day   Current packs/day: 1.00   Average packs/day: 1 pack/day for 35.0 years (35.0 ttl pk-yrs)   Types: Cigarettes  Smokeless Tobacco Never  Tobacco Comments   Started smoking age 50   Tobacco Cessation:  A prescription for an FDA-approved tobacco cessation medication was offered at discharge and the patient refused   Blood Alcohol level:  Lab Results  Component Value Date    [facility] <15 04/22/2024   ETH <10 12/27/2023    Metabolic Disorder Labs:  Lab Results  Component Value Date   HGBA1C 6.1 (H) 12/29/2023   MPG 128.37 12/29/2023   No results found for: "PROLACTIN" Lab Results  Component Value Date   CHOL 155 12/29/2023   TRIG 114 12/29/2023   HDL 31 (L) 12/29/2023   CHOLHDL 5.0 12/29/2023   VLDL 23 12/29/2023   LDLCALC 101 (H) 12/29/2023    See Psychiatric Specialty Exam and Suicide Risk Assessment completed by Attending Physician prior to discharge.  Discharge destination:  Home  Is patient on multiple antipsychotic therapies at discharge:  No   Has Patient had three or more failed trials of antipsychotic monotherapy by history:  No  Recommended Plan for Multiple Antipsychotic Therapies: NA  Discharge Instructions     Diet - low sodium heart healthy   Complete by: As directed    Increase activity slowly   Complete by: As directed       Allergies as of 04/30/2024   No Known Allergies      Medication List     STOP taking these medications    cloNIDine  0.1 MG tablet Commonly known as:  CATAPRES    mometasone -formoterol  100-5 MCG/ACT Aero Commonly known as: DULERA    traZODone  100 MG tablet Commonly known as: DESYREL        TAKE these medications      Indication  albuterol  108 (90 Base) MCG/ACT inhaler Commonly known as: VENTOLIN  HFA Inhale 2 puffs into the lungs every 6 (six) hours as needed for wheezing or shortness of breath. What changed: Another medication with the same name was removed. Continue taking this medication, and follow the directions you see here.  Indication: Worsening of Chronic Obstructive Lung Disease   escitalopram  10 MG tablet Commonly known as: LEXAPRO  Take 1 tablet (10 mg total) by mouth daily. What changed:  medication strength how much to take  Indication: Major Depressive Disorder, Social Anxiety Disorder   QUEtiapine  100 MG tablet Commonly known as: SEROQUEL  Take 1 tablet (100 mg  total) by mouth at bedtime. What changed:  medication strength how much to take  Indication: Major Depressive Disorder, sleep and racing thoughts        Follow-up Information     [facility]. Go to.   Why: In person assessment for therapy and medication management is 05/06/24 at 11 AM. Contact information: [address] [place] [address] [phone]                 Follow-up recommendations:  Activity:  as tolerated  I have reviewed this case with Dr. Jadapalle who is agreeable with this plan.   Signed: Fay Hoop, PA-C 04/30/2024, 4:49 PM

## 2024-04-30 NOTE — Progress Notes (Signed)
 Patient got haldol  and benadryl  for mild agitation. Patient was feeling anxious and depressed. States he couldn't fall asleep.  04/30/24 0600  Psych Admission Type (Psych Patients Only)  Admission Status Voluntary  Psychosocial Assessment  Patient Complaints Anxiety;Depression  Eye Contact Fair  Facial Expression Flat  Affect Blunted  Speech Logical/coherent  Interaction Assertive  Motor Activity Slow  Appearance/Hygiene Unremarkable  Behavior Characteristics Cooperative  Mood Pleasant  Aggressive Behavior  Effect No apparent injury  Thought Process  Coherency WDL  Content WDL  Delusions None reported or observed  Perception WDL  Hallucination None reported or observed  Judgment Impaired  Confusion WDL  Danger to Self  Current suicidal ideation? Denies  Danger to Others  Danger to Others None reported or observed

## 2024-04-30 NOTE — Group Note (Signed)
 Recreation Therapy Group Note   Group Topic:Relaxation  Group Date: 04/30/2024 Start Time: 1000 End Time: 1045 Facilitators: Deatrice Factor, LRT, CTRS Location: Craft Room  Group Description: PMR (Progressive Muscle Relaxation). LRT asks patients their current level of stress/anxiety from 1-10, with 10 being the highest. LRT educates patients on what PMR is and the benefits that come from it. Patients are asked to sit with their feet flat on the floor while sitting up and all the way back in their chair, if possible. LRT and pts follow a prompt through a speaker that requires you to tense and release different muscles in their body and focus on their breathing. During session, lights are off and soft music is being played. Pts are given a stress ball to use if needed. At the end of the prompt, LRT asks patients to rank their current levels of stress/anxiety from 1-10, 10 being the highest. LRT provides patients with an education handout on PMR.   Goal Area(s) Addressed:  Patients will be able to describe progressive muscle relaxation.  Patient will practice using relaxation technique. Patient will identify a new coping skill.  Patient will follow multistep directions to reduce anxiety and stress.   Affect/Mood: N/A   Participation Level: Did not attend    Clinical Observations/Individualized Feedback: Patient did not attend group.   Plan: Continue to engage patient in RT group sessions 2-3x/week.   [address] LRT, CTRS 04/30/2024 1:22 PM

## 2024-04-30 NOTE — Plan of Care (Signed)
   Problem: Education: Goal: Knowledge of [facility] Education information/materials will improve Outcome: Progressing Goal: Emotional status will improve Outcome: Progressing Goal: Mental status will improve Outcome: Progressing

## 2024-06-20 ENCOUNTER — Emergency Department
Admission: EM | Admit: 2024-06-20 | Discharge: 2024-06-20 | Disposition: A | Discharge status: Home / Self Care | Attending: Emergency Medicine | Admitting: Emergency Medicine

## 2024-06-20 ENCOUNTER — Emergency Department

## 2024-06-20 ENCOUNTER — Other Ambulatory Visit: Payer: Self-pay

## 2024-06-20 DIAGNOSIS — J441 Chronic obstructive pulmonary disease with (acute) exacerbation: Secondary | ICD-10-CM | POA: Diagnosis not present

## 2024-06-20 DIAGNOSIS — F17210 Nicotine dependence, cigarettes, uncomplicated: Secondary | ICD-10-CM | POA: Insufficient documentation

## 2024-06-20 DIAGNOSIS — R0602 Shortness of breath: Secondary | ICD-10-CM | POA: Diagnosis present

## 2024-06-20 LAB — BASIC METABOLIC PANEL WITH GFR
Anion gap: 9 (ref 5–15)
BUN: 14 mg/dL (ref 6–20)
CO2: 29 mmol/L (ref 22–32)
Calcium: 8.7 mg/dL — ABNORMAL LOW (ref 8.9–10.3)
Chloride: 101 mmol/L (ref 98–111)
Creatinine, Ser: 0.54 mg/dL — ABNORMAL LOW (ref 0.61–1.24)
GFR, Estimated: >60 mL/min (ref >60)
Glucose, Bld: 95 mg/dL (ref 70–99)
Potassium: 3.6 mmol/L (ref 3.5–5.1)
Sodium: 139 mmol/L (ref 135–145)

## 2024-06-20 LAB — CBC
HCT: 50 % (ref 39.0–52.0)
Hemoglobin: 15.9 g/dL (ref 13.0–17.0)
MCH: 30.5 pg (ref 26.0–34.0)
MCHC: 31.8 g/dL (ref 30.0–36.0)
MCV: 95.8 fL (ref 80.0–100.0)
Platelets: 270 K/uL (ref 150–400)
RBC: 5.22 MIL/uL (ref 4.22–5.81)
RDW: 14.5 % (ref 11.5–15.5)
WBC: 4.5 K/uL (ref 4.0–10.5)
nRBC: 0 % (ref 0.0–0.2)

## 2024-06-20 MED ORDER — ACETAMINOPHEN 325 MG PO TABS
650.0000 mg | ORAL_TABLET | Freq: Once | ORAL | Status: AC
  Administered 2024-06-20: 650 mg via ORAL
  Filled 2024-06-20: qty 2

## 2024-06-20 MED ORDER — METHYLPREDNISOLONE SODIUM SUCC 125 MG IJ SOLR
125.0000 mg | Freq: Once | INTRAMUSCULAR | Status: AC
  Administered 2024-06-20: 125 mg via INTRAVENOUS
  Filled 2024-06-20: qty 2

## 2024-06-20 MED ORDER — PREDNISONE 50 MG PO TABS: 50.0000 mg | ORAL_TABLET | Freq: Every day | ORAL | 0 refills | Status: DC

## 2024-06-20 MED ORDER — ALBUTEROL SULFATE HFA 108 (90 BASE) MCG/ACT IN AERS: 2.0000 | INHALATION_SPRAY | Freq: Four times a day (QID) | RESPIRATORY_TRACT | 2 refills | Status: DC | PRN

## 2024-06-20 MED ORDER — IPRATROPIUM-ALBUTEROL 0.5-2.5 (3) MG/3ML IN SOLN
3.0000 mL | Freq: Once | RESPIRATORY_TRACT | Status: AC
  Administered 2024-06-20: 3 mL via RESPIRATORY_TRACT
  Filled 2024-06-20: qty 3

## 2024-06-20 NOTE — ED Triage Notes (Signed)
 Pt in via [facility] for SOB. Hx of COPD. Has had a cough for about a week. Today has been more out of breath than normal. Pt states hes been coughing up yellow phlegm. Pt also endorses a headache x3 days.   97% on RA T-97 Hr-70's 154/102

## 2024-06-20 NOTE — ED Provider Notes (Signed)
 [facility] Provider Note    Event Date/Time   First MD Initiated Contact with Patient 06/20/24 1213     (approximate)   History   Shortness of breath   HPI  Corey House. is a 58 y.o. male with a history of COPD with continued cigarette use who presents with complaints of shortness of breath.  Patient reports breathing has worsened over the last several days, he states he gets exhausted if he moves at all.  No fevers reported, does report chronic cough, currently is productive.     Physical Exam   Triage Vital Signs: ED Triage Vitals  Encounter Vitals Group     BP 06/20/24 1217 (!) 169/107     Girls Systolic BP Percentile --      Girls Diastolic BP Percentile --      Boys Systolic BP Percentile --      Boys Diastolic BP Percentile --      Pulse Rate 06/20/24 1217 65     Resp 06/20/24 1217 18     Temp 06/20/24 1217 98.1 F (36.7 C)     Temp Source 06/20/24 1217 Oral     SpO2 06/20/24 1217 95 %     Weight 06/20/24 1214 51.1 kg (112 lb 9.6 oz)     Height 06/20/24 1214 1.626 m (5' 4)     Head Circumference --      Peak Flow --      Pain Score 06/20/24 1214 10     Pain Loc --      Pain Education --      Exclude from Growth Chart --     Most recent vital signs: Vitals:   06/20/24 1217  BP: (!) 169/107  Pulse: 65  Resp: 18  Temp: 98.1 F (36.7 C)  SpO2: 95%     General: Awake, no distress.  CV:  Good peripheral perfusion.  Resp:  Mild tachypnea, poor airflow, diffuse wheezing Abd:  No distention.  Other:  No calf pain or swelling, neuroexam is normal   ED Results / Procedures / Treatments   Labs (all labs ordered are listed, but only abnormal results are displayed) Labs Reviewed  BASIC METABOLIC PANEL WITH GFR - Abnormal; Notable for the following components:      Result Value   Creatinine, Ser 0.54 (*)    Calcium 8.7 (*)    All other components within normal limits  CBC     EKG  ED ECG REPORT I, Lamar Price, the attending physician, personally viewed and interpreted this ECG.  Date: 06/20/2024  Rhythm: normal sinus rhythm QRS Axis: normal Intervals: normal ST/T Wave abnormalities: normal Narrative Interpretation: no evidence of acute ischemia    RADIOLOGY Chest x-ray viewed interpret by me, no acute abnormality    PROCEDURES:  Critical Care performed:   Procedures   MEDICATIONS ORDERED IN ED: Medications  ipratropium-albuterol  (DUONEB) 0.5-2.5 (3) MG/3ML nebulizer solution 3 mL (3 mLs Nebulization Given 06/20/24 1307)  ipratropium-albuterol  (DUONEB) 0.5-2.5 (3) MG/3ML nebulizer solution 3 mL (3 mLs Nebulization Given 06/20/24 1306)  methylPREDNISolone  sodium succinate (SOLU-MEDROL ) 125 mg/2 mL injection 125 mg (125 mg Intravenous Given 06/20/24 1306)  acetaminophen  (TYLENOL ) tablet 650 mg (650 mg Oral Given 06/20/24 1305)     IMPRESSION / MDM / ASSESSMENT AND PLAN / ED COURSE  I reviewed the triage vital signs and the nursing notes. Patient's presentation is most consistent with severe exacerbation of chronic illness.  Patient with history of  COPD presents with complaints of shortness of breath, while lying in the bed he is satting in the low 90s.  Suspicious for COPD exacerbation versus pneumonia  Exam most consistent with COPD exacerbation, pending x-ray, labs.  Will treat with DuoNebs, steroids and reevaluate  Lab work is generally reassuring, x-ray without evidence of pneumonia per radiology,  ----------------------------------------- 2:56 PM on 06/20/2024 ----------------------------------------- Patient feeling significantly better, I considered admission however the patient states that he needs to leave by 3 PM and he does not want to stay in the hospital, he states that he can always return.  Given he is unwilling to stay in the hospital we will discharge at this time with p.o. prednisone , strict return precautions, he agrees with this plan     FINAL CLINICAL  IMPRESSION(S) / ED DIAGNOSES   Final diagnoses:  COPD exacerbation (HCC)     Rx / DC Orders   ED Discharge Orders          Ordered    predniSONE  (DELTASONE ) 50 MG tablet  Daily with breakfast        06/20/24 1435    albuterol  (VENTOLIN  HFA) 108 (90 Base) MCG/ACT inhaler  Every 6 hours PRN        06/20/24 1435             Note:  This document was prepared using Dragon voice recognition software and may include unintentional dictation errors.   Arlander Charleston, MD 06/20/24 [phone]

## 2024-06-20 NOTE — ED Notes (Signed)
 Pt hit call bell at this time. Pt is wanting to know when he can get out of here. MD made aware that pt wishes to leave in the next 20 minutes.

## 2024-08-13 NOTE — Congregational Nurse Program (Signed)
  Dept: [phone]   Congregational Nurse Program Note  Date of Encounter: 08/13/2024 Client to [facility] day center, nurse led clinic with c/o anxiety. Client reports he has been to behavioral health a few times this year. RN discussed the walk in services available at [facility], client was aware and is familiar with the new location. He stated he has Medicaid and that his sister has his card. No current medications. Emotional support given. RN to follow up with client if he returns to [facility]. MARLA Marina BSN, RN Past Medical History: Past Medical History:  Diagnosis Date   Chronic cough    Urolithiasis    Urolithiasis     Encounter Details:  Community Questionnaire - 08/13/24 1235       Questionnaire   Ask client: Do you give verbal consent for me to treat you today? Yes    Student Assistance N/A    Location Patient Served  [facility]    Encounter Setting CN site    Population Status Unhoused    Insurance Medicaid    Insurance/Financial Assistance Referral N/A    Medication Have Medication Insecurities    Medical Provider No    Screening Referrals Made N/A    Medical Referrals Made N/A    Medical Appointment Completed N/A    CNP Interventions Advocate/Support;Educate    Screenings CN Performed N/A    ED Visit Averted N/A    Life-Saving Intervention Made N/A

## 2024-10-20 ENCOUNTER — Inpatient Hospital Stay (HOSPITAL_COMMUNITY)
Admit: 2024-10-20 | Discharge: 2024-10-20 | Disposition: A | Discharge status: Home / Self Care | Payer: MEDICAID | Attending: Internal Medicine | Admitting: Internal Medicine

## 2024-10-20 ENCOUNTER — Emergency Department: Payer: MEDICAID

## 2024-10-20 ENCOUNTER — Inpatient Hospital Stay
Admission: EM | Admit: 2024-10-20 | Disposition: A | Discharge status: Other Acute Inpatient Hospital | Payer: MEDICAID | Source: Home / Self Care | Attending: Internal Medicine | Admitting: Internal Medicine

## 2024-10-20 ENCOUNTER — Other Ambulatory Visit: Payer: Self-pay

## 2024-10-20 DIAGNOSIS — R918 Other nonspecific abnormal finding of lung field: Secondary | ICD-10-CM

## 2024-10-20 DIAGNOSIS — R0609 Other forms of dyspnea: Secondary | ICD-10-CM

## 2024-10-20 DIAGNOSIS — F17200 Nicotine dependence, unspecified, uncomplicated: Secondary | ICD-10-CM | POA: Diagnosis present

## 2024-10-20 DIAGNOSIS — R110 Nausea: Secondary | ICD-10-CM

## 2024-10-20 DIAGNOSIS — F32A Depression, unspecified: Secondary | ICD-10-CM | POA: Diagnosis present

## 2024-10-20 DIAGNOSIS — E8729 Other acidosis: Secondary | ICD-10-CM

## 2024-10-20 DIAGNOSIS — I5033 Acute on chronic diastolic (congestive) heart failure: Secondary | ICD-10-CM

## 2024-10-20 DIAGNOSIS — J441 Chronic obstructive pulmonary disease with (acute) exacerbation: Secondary | ICD-10-CM | POA: Diagnosis present

## 2024-10-20 DIAGNOSIS — J9601 Acute respiratory failure with hypoxia: Principal | ICD-10-CM | POA: Diagnosis present

## 2024-10-20 DIAGNOSIS — R519 Headache, unspecified: Secondary | ICD-10-CM

## 2024-10-20 DIAGNOSIS — J439 Emphysema, unspecified: Secondary | ICD-10-CM

## 2024-10-20 DIAGNOSIS — D75839 Thrombocytosis, unspecified: Secondary | ICD-10-CM | POA: Insufficient documentation

## 2024-10-20 DIAGNOSIS — J9622 Acute and chronic respiratory failure with hypercapnia: Secondary | ICD-10-CM

## 2024-10-20 DIAGNOSIS — E43 Unspecified severe protein-calorie malnutrition: Secondary | ICD-10-CM | POA: Diagnosis present

## 2024-10-20 DIAGNOSIS — J9621 Acute and chronic respiratory failure with hypoxia: Secondary | ICD-10-CM

## 2024-10-20 DIAGNOSIS — E870 Hyperosmolality and hypernatremia: Secondary | ICD-10-CM

## 2024-10-20 DIAGNOSIS — J188 Other pneumonia, unspecified organism: Secondary | ICD-10-CM

## 2024-10-20 DIAGNOSIS — I5043 Acute on chronic combined systolic (congestive) and diastolic (congestive) heart failure: Secondary | ICD-10-CM | POA: Insufficient documentation

## 2024-10-20 LAB — CBC WITH DIFFERENTIAL/PLATELET
Abs Immature Granulocytes: 0.01 K/uL (ref 0.00–0.07)
Basophils Absolute: 0 K/uL (ref 0.0–0.1)
Basophils Relative: 1 %
Eosinophils Absolute: 0.1 K/uL (ref 0.0–0.5)
Eosinophils Relative: 1 %
HCT: 52.7 % — ABNORMAL HIGH (ref 39.0–52.0)
Hemoglobin: 15.8 g/dL (ref 13.0–17.0)
Immature Granulocytes: 0 %
Lymphocytes Relative: 18 %
Lymphs Abs: 0.9 K/uL (ref 0.7–4.0)
MCH: 29.6 pg (ref 26.0–34.0)
MCHC: 30 g/dL (ref 30.0–36.0)
MCV: 98.9 fL (ref 80.0–100.0)
Monocytes Absolute: 0.4 K/uL (ref 0.1–1.0)
Monocytes Relative: 7 %
Neutro Abs: 3.6 K/uL (ref 1.7–7.7)
Neutrophils Relative %: 73 %
Platelets: 253 K/uL (ref 150–400)
RBC: 5.33 MIL/uL (ref 4.22–5.81)
RDW: 15.1 % (ref 11.5–15.5)
WBC: 5 K/uL (ref 4.0–10.5)
nRBC: 0 % (ref 0.0–0.2)

## 2024-10-20 LAB — TROPONIN I (HIGH SENSITIVITY)
Troponin I (High Sensitivity): 41 ng/L — ABNORMAL HIGH (ref <18)
Troponin I (High Sensitivity): 38 ng/L — ABNORMAL HIGH (ref <18)

## 2024-10-20 LAB — COMPREHENSIVE METABOLIC PANEL WITH GFR
ALT: 21 U/L (ref 0–44)
AST: 34 U/L (ref 15–41)
Albumin: 3.7 g/dL (ref 3.5–5.0)
Alkaline Phosphatase: 63 U/L (ref 38–126)
Anion gap: 9 (ref 5–15)
BUN: 24 mg/dL — ABNORMAL HIGH (ref 6–20)
CO2: 36 mmol/L — ABNORMAL HIGH (ref 22–32)
Calcium: 8.2 mg/dL — ABNORMAL LOW (ref 8.9–10.3)
Chloride: 98 mmol/L (ref 98–111)
Creatinine, Ser: 0.68 mg/dL (ref 0.61–1.24)
GFR, Estimated: >60 mL/min (ref >60)
Glucose, Bld: 124 mg/dL — ABNORMAL HIGH (ref 70–99)
Potassium: 3.8 mmol/L (ref 3.5–5.1)
Sodium: 143 mmol/L (ref 135–145)
Total Bilirubin: 0.5 mg/dL (ref 0.0–1.2)
Total Protein: 6.6 g/dL (ref 6.5–8.1)

## 2024-10-20 LAB — LACTIC ACID, PLASMA: Lactic Acid, Venous: 0.9 mmol/L (ref 0.5–1.9)

## 2024-10-20 LAB — BRAIN NATRIURETIC PEPTIDE: B Natriuretic Peptide: 284.2 pg/mL — ABNORMAL HIGH (ref 0.0–100.0)

## 2024-10-20 LAB — MAGNESIUM: Magnesium: 2.2 mg/dL (ref 1.7–2.4)

## 2024-10-20 LAB — RESP PANEL BY RT-PCR (RSV, FLU A&B, COVID)  RVPGX2
Influenza A by PCR: NEGATIVE
Influenza B by PCR: NEGATIVE
Resp Syncytial Virus by PCR: NEGATIVE
SARS Coronavirus 2 by RT PCR: NEGATIVE

## 2024-10-20 LAB — PROTIME-INR
INR: 1 (ref 0.8–1.2)
Prothrombin Time: 13.5 s (ref 11.4–15.2)

## 2024-10-20 LAB — APTT: aPTT: 32 s (ref 24–36)

## 2024-10-20 MED ORDER — IPRATROPIUM-ALBUTEROL 0.5-2.5 (3) MG/3ML IN SOLN: 3.0000 mL | Freq: Four times a day (QID) | RESPIRATORY_TRACT | Status: DC

## 2024-10-20 MED ORDER — PANTOPRAZOLE SODIUM 40 MG PO TBEC
40.0000 mg | DELAYED_RELEASE_TABLET | Freq: Every day | ORAL | Status: AC
  Administered 2024-10-20 – 2025-01-22 (×95): 40 mg via ORAL
  Filled 2024-10-20 (×88): qty 1

## 2024-10-20 MED ORDER — ENOXAPARIN SODIUM 40 MG/0.4ML IJ SOSY
40.0000 mg | PREFILLED_SYRINGE | INTRAMUSCULAR | Status: DC
  Administered 2024-10-20 – 2024-12-21 (×60): 40 mg via SUBCUTANEOUS
  Filled 2024-10-20 (×55): qty 0.4

## 2024-10-20 MED ORDER — NICOTINE POLACRILEX 2 MG MT GUM
2.0000 mg | CHEWING_GUM | OROMUCOSAL | Status: AC | PRN
Start: 2024-10-20 — End: ?

## 2024-10-20 MED ORDER — METHYLPREDNISOLONE SODIUM SUCC 125 MG IJ SOLR
125.0000 mg | Freq: Once | INTRAMUSCULAR | Status: AC
  Administered 2024-10-20: 125 mg via INTRAVENOUS
  Filled 2024-10-20: qty 2

## 2024-10-20 MED ORDER — PREDNISONE 20 MG PO TABS: 40.0000 mg | ORAL_TABLET | Freq: Every day | ORAL | Status: DC

## 2024-10-20 MED ORDER — IPRATROPIUM-ALBUTEROL 0.5-2.5 (3) MG/3ML IN SOLN
3.0000 mL | Freq: Once | RESPIRATORY_TRACT | Status: AC
  Administered 2024-10-20: 3 mL via RESPIRATORY_TRACT
  Filled 2024-10-20: qty 3

## 2024-10-20 MED ORDER — ALBUTEROL SULFATE (2.5 MG/3ML) 0.083% IN NEBU
2.5000 mg | INHALATION_SOLUTION | RESPIRATORY_TRACT | Status: AC | PRN
Start: 2024-10-20 — End: ?
  Administered 2024-10-23: 2.5 mg via RESPIRATORY_TRACT
  Filled 2024-10-20: qty 3

## 2024-10-20 MED ORDER — FUROSEMIDE 10 MG/ML IJ SOLN
40.0000 mg | Freq: Two times a day (BID) | INTRAMUSCULAR | Status: DC
  Administered 2024-10-20 – 2024-10-21 (×3): 40 mg via INTRAVENOUS
  Filled 2024-10-20 (×3): qty 4

## 2024-10-20 MED ORDER — MELATONIN 5 MG PO TABS
5.0000 mg | ORAL_TABLET | Freq: Every evening | ORAL | Status: AC | PRN
  Administered 2024-10-24 – 2025-01-22 (×50): 5 mg via ORAL
  Filled 2024-10-20 (×48): qty 1

## 2024-10-20 MED ORDER — NICOTINE 14 MG/24HR TD PT24
14.0000 mg | MEDICATED_PATCH | Freq: Every day | TRANSDERMAL | Status: AC
  Administered 2024-10-20 – 2025-01-22 (×95): 14 mg via TRANSDERMAL
  Filled 2024-10-20 (×88): qty 1

## 2024-10-20 MED ORDER — METHYLPREDNISOLONE SODIUM SUCC 40 MG IJ SOLR
40.0000 mg | Freq: Two times a day (BID) | INTRAMUSCULAR | Status: DC
  Administered 2024-10-20 – 2024-10-21 (×2): 40 mg via INTRAVENOUS
  Filled 2024-10-20 (×2): qty 1

## 2024-10-20 MED ORDER — FUROSEMIDE 10 MG/ML IJ SOLN
40.0000 mg | Freq: Once | INTRAMUSCULAR | Status: AC
  Administered 2024-10-20: 40 mg via INTRAVENOUS
  Filled 2024-10-20: qty 4

## 2024-10-20 MED ORDER — IOHEXOL 350 MG/ML SOLN
75.0000 mL | Freq: Once | INTRAVENOUS | Status: AC | PRN
  Administered 2024-10-20: 75 mL via INTRAVENOUS

## 2024-10-20 MED ORDER — IPRATROPIUM-ALBUTEROL 0.5-2.5 (3) MG/3ML IN SOLN
3.0000 mL | Freq: Two times a day (BID) | RESPIRATORY_TRACT | Status: DC
  Administered 2024-10-21 – 2024-10-22 (×3): 3 mL via RESPIRATORY_TRACT
  Filled 2024-10-20 (×3): qty 3

## 2024-10-20 MED ORDER — GUAIFENESIN ER 600 MG PO TB12
600.0000 mg | ORAL_TABLET | Freq: Two times a day (BID) | ORAL | Status: AC
  Administered 2024-10-20 – 2025-01-22 (×189): 600 mg via ORAL
  Filled 2024-10-20 (×175): qty 1

## 2024-10-20 MED ORDER — POLYVINYL ALCOHOL 1.4 % OP SOLN
1.0000 [drp] | OPHTHALMIC | Status: AC | PRN
  Filled 2024-10-20: qty 15

## 2024-10-20 NOTE — H&P (Addendum)
 History and Physical    Corey House. FMW:[id number] DOB: 07-10-1966 DOA: 10/20/2024  DOS: the patient was seen and examined on 10/20/2024  PCP: Patient, [name]   Patient coming from: Homeless  I have personally briefly reviewed patient's old medical records in [facility] and CareEverywhere  HPI:   Corey House. is a 58 y.o. year old male with past medical history of  tobacco use disorder reporting a 41 year pack per year history, smoking ~1 pack per day since he was 58 years old. He denies any additional medical history though he has not seen a medical provider in ~20 years. He presents to [facility] ED with shortness of breath that began ~ 9 months ago and worsened ~3 months ago. He reports today his dyspnea is not significantly different than it was 3 months ago. He endorses a chronic cough that has been present since childhood. Her ports the cough is currently, minimally productive with white phlegm. Additionally, he endorses bilateral lower extremity swelling that began in the last 2 days, per his report (R) > (L). On exam the swelling is equal bilaterally. He endorses fatigue and dyspnea that is worse with position changes such as bending over and when standing from lying. He denies chest pain, palpitations, or fever.  ED Course: On arrival to [facility] ED patient was noted to be afebrile temp 36.8C, BP 142/111, HR 88, RR 22, SPO2 100% on 15L non-rebreather. Patient now on 2L via nasal cannula with SPO2 96%. CXR obtained and shows non specific findings of increased interstitial markings. CTA PE obtained and shows no PE, dilated RV and reflux of contract into the IVC and hepatic vein consistent with (R) heart failure, chronic bronchitis, and emphysema. US  DVT (B)LE negative for DVT. Labs notable Negative COVID, flu, RSV,for BNP 284.2, BUN 24, troponin 41--> 38 with no report of chest pain.  Lactic acid 0.9.  Blood cultures drawn and in process.  He was  given Lasix, DuoNebs, and Solu-Medrol .  TRH contacted for admission.   Review of Systems: As mentioned in the history of present illness. All other systems reviewed and are negative.  Review of Systems  Constitutional:  Positive for malaise/fatigue. Negative for chills and fever.  HENT:  Negative for congestion and sinus pain.   Respiratory:  Positive for cough, sputum production and shortness of breath.   Cardiovascular:  Positive for leg swelling. Negative for chest pain and palpitations.  Gastrointestinal:  Negative for abdominal pain, constipation, diarrhea, heartburn, nausea and vomiting.  Genitourinary:  Negative for dysuria and frequency.  Musculoskeletal:  Negative for falls, joint pain and myalgias.  Neurological:  Negative for dizziness, focal weakness and weakness.    Past Medical History:  Diagnosis Date   Chronic cough    Urolithiasis    Urolithiasis     Past Surgical History:  Procedure Laterality Date   CATARACT EXTRACTION W/PHACO Left 08/09/2021   Procedure: CATARACT EXTRACTION PHACO AND INTRAOCULAR LENS PLACEMENT (IOC) LEFT .051 00:10.0;  Surgeon: Mittie Gaskin, MD;  Location: [facility];  Service: Ophthalmology;  Laterality: Left;   CATARACT EXTRACTION W/PHACO Right 10/04/2021   Procedure: CATARACT EXTRACTION PHACO AND INTRAOCULAR LENS PLACEMENT (IOC) RIGHT 0.67 00:13.8;  Surgeon: Mittie Gaskin, MD;  Location: [facility];  Service: Ophthalmology;  Laterality: Right;  general anesthesia needs to be last case   HERNIA REPAIR     x2   MANDIBLE FRACTURE SURGERY       reports  that he has been smoking cigarettes. He has a 35 pack-year smoking history. He has never used smokeless tobacco. He reports that he does not currently use drugs. He reports that he does not drink alcohol.  No Known Allergies  No family history on file.  Prior to Admission medications   Medication Sig Start Date End Date Taking? Authorizing Provider  albuterol   (VENTOLIN  HFA) 108 (90 Base) MCG/ACT inhaler Inhale 2 puffs into the lungs every 6 (six) hours as needed for wheezing or shortness of breath. 06/20/24  Yes Corey Charleston, MD  escitalopram  (LEXAPRO ) 10 MG tablet Take 1 tablet (10 mg total) by mouth daily. Patient not taking: Reported on 10/20/2024 04/30/24   Corey House, Corey E, PA-C  predniSONE  (DELTASONE ) 50 MG tablet Take 1 tablet (50 mg total) by mouth daily with breakfast. Patient not taking: Reported on 10/20/2024 06/20/24   Corey Charleston, MD  QUEtiapine  (SEROQUEL ) 100 MG tablet Take 1 tablet (100 mg total) by mouth at bedtime. Patient not taking: Reported on 10/20/2024 04/29/24   Corey Donnice BRAVO, PA-C    Physical Exam: Vitals:   10/20/24 1500 10/20/24 1515 10/20/24 1600 10/20/24 1634  BP: (!) 131/95  (!) 122/91 130/82  Pulse: 70  65 74  Resp: 13  10 18   Temp:    98.2 F (36.8 C)  TempSrc:    Oral  SpO2: 97% 96%  92%  Weight:      Height:        Physical Exam Vitals and nursing note reviewed.  HENT:     Head: Normocephalic.  Eyes:     Pupils: Pupils are equal, round, and reactive to light.  Cardiovascular:     Rate and Rhythm: Normal rate and regular rhythm.     Pulses: Normal pulses.     Heart sounds: No murmur heard.    No friction rub. No gallop.  Pulmonary:     Effort: Pulmonary effort is normal.     Breath sounds: Rhonchi present.  Abdominal:     General: Bowel sounds are normal.     Palpations: Abdomen is soft. There is no mass.     Tenderness: There is no abdominal tenderness. There is no guarding.  Musculoskeletal:     Right lower leg: Edema present.     Left lower leg: Edema present.  Skin:    General: Skin is warm and dry.     Capillary Refill: Capillary refill takes less than 2 seconds.  Neurological:     General: No focal deficit present.     Mental Status: He is alert and oriented to person, place, and time.      Labs on Admission: I have personally reviewed following labs and imaging  studies  CBC: Recent Labs  Lab 10/20/24 1103  WBC 5.0  NEUTROABS 3.6  HGB 15.8  HCT 52.7*  MCV 98.9  PLT 253   Basic Metabolic Panel: Recent Labs  Lab 10/20/24 1103  NA 143  K 3.8  CL 98  CO2 36*  GLUCOSE 124*  BUN 24*  CREATININE 0.68  CALCIUM 8.2*   GFR: Estimated Creatinine Clearance: 85.8 mL/min (by C-G formula based on SCr of 0.68 mg/dL). Liver Function Tests: Recent Labs  Lab 10/20/24 1103  AST 34  ALT 21  ALKPHOS 63  BILITOT 0.5  PROT 6.6  ALBUMIN 3.7   No results for input(s): LIPASE, AMYLASE in the last 168 hours. No results for input(s): AMMONIA in the last 168 hours. Coagulation Profile: Recent Labs  Lab 10/20/24 1103  INR 1.0   Cardiac Enzymes: Recent Labs  Lab 10/20/24 1103 10/20/24 1309  TROPONINIHS 41* 38*   BNP (last 3 results) Recent Labs    10/20/24 1103  BNP 284.2*   HbA1C: No results for input(s): HGBA1C in the last 72 hours. CBG: No results for input(s): GLUCAP in the last 168 hours. Lipid Profile: No results for input(s): CHOL, HDL, LDLCALC, TRIG, CHOLHDL, LDLDIRECT in the last 72 hours. Thyroid Function Tests: No results for input(s): TSH, T4TOTAL, FREET4, T3FREE, THYROIDAB in the last 72 hours. Anemia Panel: No results for input(s): VITAMINB12, FOLATE, FERRITIN, TIBC, IRON, RETICCTPCT in the last 72 hours. Urine analysis:    Component Value Date/Time   COLORURINE YELLOW (A) 01/03/2023 2105   APPEARANCEUR CLEAR (A) 01/03/2023 2105   LABSPEC 1.025 01/03/2023 2105   PHURINE 5.0 01/03/2023 2105   GLUCOSEU NEGATIVE 01/03/2023 2105   HGBUR NEGATIVE 01/03/2023 2105   BILIRUBINUR NEGATIVE 01/03/2023 2105   KETONESUR 20 (A) 01/03/2023 2105   PROTEINUR 30 (A) 01/03/2023 2105   NITRITE NEGATIVE 01/03/2023 2105   LEUKOCYTESUR NEGATIVE 01/03/2023 2105    Radiological Exams on Admission: I have personally reviewed images CT Angio Chest PE W and/or Wo Contrast Result Date:  10/20/2024 EXAM: CTA of the Chest with contrast for PE 10/20/2024 02:19:01 PM TECHNIQUE: CTA of the chest was performed without and with the administration of 75 mL of iohexol  (OMNIPAQUE ) 350 MG/ML injection. Multiplanar reformatted images are provided for review. MIP images are provided for review. Automated exposure control, iterative reconstruction, and/or weight based adjustment of the mA/kV was utilized to reduce the radiation dose to as low as reasonably achievable. COMPARISON: Same day x-ray and CTA chest 01/04/2023. CLINICAL HISTORY: Pulmonary embolism (PE) suspected, high prob. FINDINGS: PULMONARY ARTERIES: Pulmonary arteries are adequately opacified for evaluation. Negative for pulmonary embolism. Main pulmonary artery is normal in caliber. MEDIASTINUM: Dilated right ventricle with reflux of contrast into the IVC and hepatic veins compatible with elevated right heart pressures. There is no acute abnormality of the thoracic aorta. LYMPH NODES: No mediastinal, hilar or axillary lymphadenopathy. LUNGS AND PLEURA: Diffuse bronchial wall thickening. Mucous plugging in the lower lobes. Emphysema. No focal consolidation or pulmonary edema. No pleural effusion or pneumothorax. UPPER ABDOMEN: Limited images of the upper abdomen are unremarkable. SOFT TISSUES AND BONES: No acute bone or soft tissue abnormality. IMPRESSION: 1. No pulmonary embolism. 2. Dilated right ventricle with reflux of contrast into the IVC and hepatic veins, compatible with elevated right heart pressures. 3. Chronic bronchitis. 4. Emphysema. Given patient age, consider evaluation for low-dose CT lung cancer screening. Electronically signed by: Norman Gatlin MD 10/20/2024 02:49 PM EST RP Workstation: [id number]   US  Venous Img Lower Bilateral Result Date: 10/20/2024 CLINICAL DATA:  Bilateral lower extremity edema. EXAM: BILATERAL LOWER EXTREMITY VENOUS DOPPLER ULTRASOUND TECHNIQUE: Gray-scale sonography with graded compression, as well as  color Doppler and duplex ultrasound were performed to evaluate the lower extremity deep venous systems from the level of the common femoral vein and including the common femoral, femoral, profunda femoral, popliteal and calf veins including the posterior tibial, peroneal and gastrocnemius veins when visible. The superficial great saphenous vein was also interrogated. Spectral Doppler was utilized to evaluate flow at rest and with distal augmentation maneuvers in the common femoral, femoral and popliteal veins. COMPARISON:  None Available. FINDINGS: RIGHT LOWER EXTREMITY Common Femoral Vein: No evidence of thrombus. Normal compressibility, respiratory phasicity and response to augmentation. Saphenofemoral Junction: No evidence of thrombus. Normal compressibility  and flow on color Doppler imaging. Profunda Femoral Vein: No evidence of thrombus. Normal compressibility and flow on color Doppler imaging. Femoral Vein: No evidence of thrombus. Normal compressibility, respiratory phasicity and response to augmentation. Popliteal Vein: No evidence of thrombus. Normal compressibility, respiratory phasicity and response to augmentation. Calf Veins: No evidence of thrombus. Normal compressibility and flow on color Doppler imaging. Superficial Great Saphenous Vein: No evidence of thrombus. Normal compressibility. Venous Reflux:  None. Other Findings: No evidence of superficial thrombophlebitis or abnormal fluid collection. Deep vein waveforms are felt to be somewhat more pulsatile than normal. LEFT LOWER EXTREMITY Common Femoral Vein: No evidence of thrombus. Normal compressibility, respiratory phasicity and response to augmentation. Saphenofemoral Junction: No evidence of thrombus. Normal compressibility and flow on color Doppler imaging. Profunda Femoral Vein: No evidence of thrombus. Normal compressibility and flow on color Doppler imaging. Femoral Vein: No evidence of thrombus. Normal compressibility, respiratory phasicity  and response to augmentation. Popliteal Vein: No evidence of thrombus. Normal compressibility, respiratory phasicity and response to augmentation. Calf Veins: No evidence of thrombus. Normal compressibility and flow on color Doppler imaging. Superficial Great Saphenous Vein: No evidence of thrombus. Normal compressibility. Venous Reflux:  None. Other Findings: No evidence of superficial thrombophlebitis or abnormal fluid collection. Deep vein waveforms are felt to be somewhat more pulsatile than normal. IMPRESSION: 1. No evidence of deep venous thrombosis in either lower extremity. 2. Deep vein waveforms are felt to be somewhat more pulsatile than normal. This can be seen with elevated right heart pressures and right heart failure. Electronically Signed   By: Marcey Moan M.D.   On: 10/20/2024 13:18   DG Chest Portable 1 View Result Date: 10/20/2024 CLINICAL DATA:  Shortness of breath EXAM: PORTABLE CHEST 1 VIEW COMPARISON:  Chest radiograph June 20, 2024. FINDINGS: The heart size and mediastinal contours are within normal limits. Increased interstitial markings of both lung fields, similar to prior. No new consolidation. Mild increased lung volumes. No pleural effusion or pneumothorax. The visualized skeletal structures are unremarkable. IMPRESSION: Increased interstitial marking of both lung fields. Recommend dedicated dual phase chest CT for further assessment. Electronically Signed   By: Megan  Zare M.D.   On: 10/20/2024 12:27    Assessment/Plan Principal Problem:   Acute hypoxic respiratory failure (HCC)    #Acute Hypoxic Respiratory Failure - Supplemental O2  - Treat as below  #Acute on Chronic Congestive Heart Failure BNP 284.2. Imaging with evidence of elevated (R) heart pressures. Patient with dyspnea on exertion and bilateral lower extremity swelling. - IV Lasix 40 mg BID - ECHO - Strict I/Os - Daily Weights - 1800 mL fluid restriction - Check Lipid panel, HGB A1C, TSH - Troponin  41-->39  #COPD with Acute Exacerbation - IV Solumedrol - Duonebs  - Add on Sputum culture and 20 Pathogen panel - Mucinex  - Supplemental O2 as needed - Pulmonary toilet: Mobilize/incentive spirometry/flutter valve   #Tobacco Use Disorder - Nicotine  replacement therapy while inpatient - Counseled on importance of cessation   VTE prophylaxis:  Lovenox   GI prophylaxis: Protonix Diet: Heart Healthy Access: PIV Lines: None Code Status:  Full Code Telemetry: Yes  Admission status: Inpatient, Telemetry bed Patient is from: Homeless  Anticipated d/c is to: Previous environment  Anticipated d/c date is: 2-3 days Patient currently: On supplemental O2, receiving IV steroids, and IV lasix   Family Communication: No family at bedside. Plan of care discussed with patient at bedside.  Questions welcomed and elicited.  Questions answered to patient's expressed satisfaction.  Patient is cognitively able to update family independently if desired.    Consults called: N/A    Severity of Illness: The appropriate patient status for this patient is INPATIENT. Inpatient status is judged to be reasonable and necessary in order to provide the required intensity of service to ensure the patient's safety. The patient's presenting symptoms, physical exam findings, and initial radiographic and laboratory data in the context of their chronic comorbidities is felt to place them at high risk for further clinical deterioration. Furthermore, it is not anticipated that the patient will be medically stable for discharge from the hospital within 2 midnights of admission.   * I certify that at the point of admission it is my clinical judgment that the patient will require inpatient hospital care spanning beyond 2 midnights from the point of admission due to high intensity of service, high risk for further deterioration and high frequency of surveillance required.*  To reach the provider On-Call:   7AM- 7PM see  care teams to locate the attending and reach out to them via [email or web address]. Password: [email or web address] 7PM-7AM contact night-coverage If you still have difficulty reaching the appropriate provider, please page the [facility] (Director on Call) for [facility] on amion for assistance  This document was prepared using Conservation officer, historic buildings and may include unintentional dictation errors.  Rockie Rams FNP-BC, PMHNP-BC Nurse Practitioner [facility]

## 2024-10-20 NOTE — ED Notes (Signed)
Pt given coke with ice 

## 2024-10-20 NOTE — ED Triage Notes (Signed)
 Pt arrives via [facility] for exertional dyspnea. Pt is homeless and was very cold, per EMS. Pt placed on oxygen 2lpm and improved from 80s to 94% SpO2. Pt ambulatory from ambulance to ER room 8.    CBG 118

## 2024-10-20 NOTE — ED Provider Notes (Signed)
 [facility] Provider Note    Event Date/Time   First MD Initiated Contact with Patient 10/20/24 1051     (approximate)   History   Shortness of Breath   HPI  Corey House. is a 58 y.o. male who is homeless with COPD who comes in with shortness of breath.  Patient really denies any symptoms of feeling ill.  He reports a little bit of a cough but nothing that is super different than his baseline.  He does report some increasing swelling in his left leg more than his right.  He states that he does not drink any daily alcohol.  No drug use.  With EMS patient was found to be in the 80s and so was placed on oxygen.  When he came into the emergency room we took him off the oxygen.  Physical Exam   Triage Vital Signs: ED Triage Vitals  Encounter Vitals Group     BP 10/20/24 1056 (!) 142/111     Girls Systolic BP Percentile --      Girls Diastolic BP Percentile --      Boys Systolic BP Percentile --      Boys Diastolic BP Percentile --      Pulse Rate 10/20/24 1053 88     Resp 10/20/24 1053 (!) 22     Temp 10/20/24 1056 98.3 F (36.8 C)     Temp Source 10/20/24 1056 Oral     SpO2 10/20/24 1052 (!) 54 %     Weight 10/20/24 1050 129 lb 10.1 oz (58.8 kg)     Height 10/20/24 1050 5' 4 (1.626 m)     Head Circumference --      Peak Flow --      Pain Score 10/20/24 1050 0     Pain Loc --      Pain Education --      Exclude from Growth Chart --     Most recent vital signs: Vitals:   10/20/24 1130 10/20/24 1200  BP: 130/88 (!) 126/90  Pulse: 80 78  Resp: 13 13  Temp:    SpO2: 92% 91%     General: Awake, no distress.  CV:  Good peripheral perfusion.  Resp:  Normal effort.  No wheezing noted Abd:  No distention.  Soft and nontender Other:  Swelling noted in the legs with left slightly worse than the right.   ED Results / Procedures / Treatments   Labs (all labs ordered are listed, but only abnormal results are displayed) Labs Reviewed   CBC WITH DIFFERENTIAL/PLATELET - Abnormal; Notable for the following components:      Result Value   HCT 52.7 (*)    All other components within normal limits  COMPREHENSIVE METABOLIC PANEL WITH GFR - Abnormal; Notable for the following components:   CO2 36 (*)    Glucose, Bld 124 (*)    BUN 24 (*)    Calcium 8.2 (*)    All other components within normal limits  TROPONIN I (HIGH SENSITIVITY) - Abnormal; Notable for the following components:   Troponin I (High Sensitivity) 41 (*)    All other components within normal limits  RESP PANEL BY RT-PCR (RSV, FLU A&B, COVID)  [id number]  CULTURE, BLOOD (ROUTINE X 2)  CULTURE, BLOOD (ROUTINE X 2)  LACTIC ACID, PLASMA  PROTIME-INR  APTT  BRAIN NATRIURETIC PEPTIDE  URINALYSIS, ROUTINE W REFLEX MICROSCOPIC  LACTIC ACID, PLASMA     EKG  My interpretation  of EKG:  Normal sinus rate of 82 without any ST elevation, T wave version in V2 and V3, normal intervals  RADIOLOGY I have reviewed the xray personally and interpreted no focal pneumonia   PROCEDURES:  Critical Care performed: Yes, see critical care procedure note(s)  .1-3 Lead EKG Interpretation  Performed by: Ernest Ronal BRAVO, MD Authorized by: Ernest Ronal BRAVO, MD     Interpretation: normal     ECG rate:  70   ECG rate assessment: normal     Rhythm: sinus rhythm     Ectopy: none     Conduction: normal   .Critical Care  Performed by: Ernest Ronal BRAVO, MD Authorized by: Ernest Ronal BRAVO, MD   Critical care provider statement:    Critical care time (minutes):  30   Critical care was necessary to treat or prevent imminent or life-threatening deterioration of the following conditions:  Respiratory failure   Critical care was time spent personally by me on the following activities:  Development of treatment plan with patient or surrogate, discussions with consultants, evaluation of patient's response to treatment, examination of patient, ordering and review of laboratory studies, ordering  and review of radiographic studies, ordering and performing treatments and interventions, pulse oximetry, re-evaluation of patient's condition and review of old charts    MEDICATIONS ORDERED IN ED: Medications  methylPREDNISolone  sodium succinate (SOLU-MEDROL ) 125 mg/2 mL injection 125 mg (has no administration in time range)  ipratropium-albuterol  (DUONEB) 0.5-2.5 (3) MG/3ML nebulizer solution 3 mL (has no administration in time range)  iohexol  (OMNIPAQUE ) 350 MG/ML injection 75 mL (75 mLs Intravenous Contrast Given 10/20/24 1347)     IMPRESSION / MDM / ASSESSMENT AND PLAN / ED COURSE  I reviewed the triage vital signs and the nursing notes.   Patient's presentation is most consistent with acute presentation with potential threat to life or bodily function.   Patient comes in with shortness of breath.  Satting in the 20s with EMS.  Patient taken off oxygen with nursing staff and patient was satting in the 50s with good waveform.  No work of breathing and therefore patient was placed on nonrebreather and then transition back to 2 L of nasal cannula.  Workup was done to evaluate for pneumonia, COVID, ACS, CHF.  Will also get ultrasounds, PE scan given prior D-dimers have been falsely elevated and my concern for the potential of PE given no wheezing on examination and chest x-ray without evidence of focal pneumonia.   Coags are reassuring, COVID is negative CBC reassuring CMP slightly elevated bicarb but has had that previously.  Troponin is elevated.  Lactate is normal.  CT imaging shows no pulmonary embolism but does show signs of probable pulmonary hypertension, emphysema.  I wonder if he could have a viral illness that is flaring him up or if this is just his baseline emphysema given he has not seen a doctor he is hypoxic.  Will try some steroids, DuoNeb although he really does not have too much wheezing.  Given his hypoxia confirmed both with EMS and with's we will discuss possible team for  admission.  Will give some IV Lasix given he does have swelling his legs with concerns for some buildup of right pressures.  The patient is on the cardiac monitor to evaluate for evidence of arrhythmia and/or significant heart rate changes.      FINAL CLINICAL IMPRESSION(S) / ED DIAGNOSES   Final diagnoses:  Acute respiratory failure with hypoxia (HCC)  Emphysema lung (HCC)  Rx / DC Orders   ED Discharge Orders     None        Note:  This document was prepared using Dragon voice recognition software and may include unintentional dictation errors.   Ernest Ronal BRAVO, MD 10/20/24 1501

## 2024-10-20 NOTE — ED Notes (Signed)
 Patient transported to CT

## 2024-10-20 NOTE — ED Notes (Signed)
 US  at bedside.

## 2024-10-21 ENCOUNTER — Encounter: Payer: Self-pay | Admitting: Osteopathic Medicine

## 2024-10-21 DIAGNOSIS — J441 Chronic obstructive pulmonary disease with (acute) exacerbation: Secondary | ICD-10-CM

## 2024-10-21 DIAGNOSIS — J9601 Acute respiratory failure with hypoxia: Secondary | ICD-10-CM | POA: Diagnosis not present

## 2024-10-21 DIAGNOSIS — I5043 Acute on chronic combined systolic (congestive) and diastolic (congestive) heart failure: Secondary | ICD-10-CM

## 2024-10-21 DIAGNOSIS — Z720 Tobacco use: Secondary | ICD-10-CM

## 2024-10-21 DIAGNOSIS — F17200 Nicotine dependence, unspecified, uncomplicated: Secondary | ICD-10-CM | POA: Diagnosis not present

## 2024-10-21 DIAGNOSIS — E870 Hyperosmolality and hypernatremia: Secondary | ICD-10-CM

## 2024-10-21 DIAGNOSIS — I5033 Acute on chronic diastolic (congestive) heart failure: Secondary | ICD-10-CM | POA: Diagnosis not present

## 2024-10-21 DIAGNOSIS — F32A Depression, unspecified: Secondary | ICD-10-CM

## 2024-10-21 DIAGNOSIS — E8729 Other acidosis: Secondary | ICD-10-CM

## 2024-10-21 DIAGNOSIS — R519 Headache, unspecified: Secondary | ICD-10-CM

## 2024-10-21 LAB — CBC
HCT: 46.7 % (ref 39.0–52.0)
Hemoglobin: 14.8 g/dL (ref 13.0–17.0)
MCH: 30.5 pg (ref 26.0–34.0)
MCHC: 31.7 g/dL (ref 30.0–36.0)
MCV: 96.1 fL (ref 80.0–100.0)
Platelets: 251 K/uL (ref 150–400)
RBC: 4.86 MIL/uL (ref 4.22–5.81)
RDW: 14.5 % (ref 11.5–15.5)
WBC: 3.3 K/uL — ABNORMAL LOW (ref 4.0–10.5)
nRBC: 0 % (ref 0.0–0.2)

## 2024-10-21 LAB — ECHOCARDIOGRAM COMPLETE
AR max vel: 2.07 cm2
AV Area VTI: 2.18 cm2
AV Area mean vel: 2.04 cm2
AV Mean grad: 4.4 mmHg
AV Peak grad: 9.5 mmHg
Ao pk vel: 1.54 m/s
Area-P 1/2: 3.45 cm2
Height: 64 in
S' Lateral: 2.7 cm
Weight: 2074.09 [oz_av]

## 2024-10-21 LAB — RESPIRATORY PANEL BY PCR

## 2024-10-21 LAB — BASIC METABOLIC PANEL WITH GFR
Anion gap: 16 — ABNORMAL HIGH (ref 5–15)
BUN: 19 mg/dL (ref 6–20)
CO2: 34 mmol/L — ABNORMAL HIGH (ref 22–32)
Calcium: 8.1 mg/dL — ABNORMAL LOW (ref 8.9–10.3)
Chloride: 97 mmol/L — ABNORMAL LOW (ref 98–111)
Creatinine, Ser: 0.79 mg/dL (ref 0.61–1.24)
GFR, Estimated: >60 mL/min (ref >60)
Glucose, Bld: 153 mg/dL — ABNORMAL HIGH (ref 70–99)
Potassium: 4.1 mmol/L (ref 3.5–5.1)
Sodium: 147 mmol/L — ABNORMAL HIGH (ref 135–145)

## 2024-10-21 LAB — LIPID PANEL
Cholesterol: 152 mg/dL (ref 0–200)
HDL: 62 mg/dL (ref >40)
LDL Cholesterol: 78 mg/dL (ref 0–99)
Total CHOL/HDL Ratio: 2.5 ratio
Triglycerides: 60 mg/dL (ref <150)
VLDL: 12 mg/dL (ref 0–40)

## 2024-10-21 LAB — HEMOGLOBIN A1C
Hgb A1c MFr Bld: 6.3 % — ABNORMAL HIGH (ref 4.8–5.6)
Mean Plasma Glucose: 134.11 mg/dL

## 2024-10-21 LAB — LACTIC ACID, PLASMA: Lactic Acid, Venous: 1.5 mmol/L (ref 0.5–1.9)

## 2024-10-21 LAB — T4, FREE: Free T4: 0.85 ng/dL (ref 0.61–1.12)

## 2024-10-21 LAB — TSH: TSH: 0.227 u[IU]/mL — ABNORMAL LOW (ref 0.350–4.500)

## 2024-10-21 LAB — HIV ANTIBODY (ROUTINE TESTING W REFLEX): HIV Screen 4th Generation wRfx: NONREACTIVE

## 2024-10-21 MED ORDER — ACETAMINOPHEN 325 MG PO TABS
650.0000 mg | ORAL_TABLET | Freq: Four times a day (QID) | ORAL | Status: AC | PRN
  Administered 2024-10-21 – 2025-01-21 (×86): 650 mg via ORAL
  Filled 2024-10-21 (×86): qty 2

## 2024-10-21 MED ORDER — QUETIAPINE FUMARATE 25 MG PO TABS
25.0000 mg | ORAL_TABLET | Freq: Every day | ORAL | Status: DC
  Administered 2024-10-21 – 2024-12-17 (×57): 25 mg via ORAL
  Filled 2024-10-21 (×53): qty 1

## 2024-10-21 MED ORDER — METHYLPREDNISOLONE SODIUM SUCC 40 MG IJ SOLR
40.0000 mg | Freq: Every day | INTRAMUSCULAR | Status: DC
  Administered 2024-10-22 – 2024-10-23 (×2): 40 mg via INTRAVENOUS
  Filled 2024-10-21 (×2): qty 1

## 2024-10-21 MED ORDER — ESCITALOPRAM OXALATE 10 MG PO TABS
10.0000 mg | ORAL_TABLET | Freq: Every day | ORAL | Status: DC
  Administered 2024-10-22 – 2024-11-16 (×27): 10 mg via ORAL
  Filled 2024-10-21 (×26): qty 1

## 2024-10-21 MED ORDER — ALPRAZOLAM 0.25 MG PO TABS
0.2500 mg | ORAL_TABLET | Freq: Three times a day (TID) | ORAL | Status: DC | PRN
  Administered 2024-10-21 – 2024-11-26 (×88): 0.25 mg via ORAL
  Filled 2024-10-21 (×89): qty 1

## 2024-10-21 MED ORDER — MAGNESIUM SULFATE 2 GM/50ML IV SOLN
2.0000 g | Freq: Once | INTRAVENOUS | Status: AC
  Administered 2024-10-21: 2 g via INTRAVENOUS
  Filled 2024-10-21: qty 50

## 2024-10-21 MED ORDER — ADULT MULTIVITAMIN W/MINERALS CH
1.0000 | ORAL_TABLET | Freq: Every day | ORAL | Status: AC
  Administered 2024-10-21 – 2025-01-22 (×95): 1 via ORAL
  Filled 2024-10-21 (×87): qty 1

## 2024-10-21 MED ORDER — AZITHROMYCIN 500 MG PO TABS
250.0000 mg | ORAL_TABLET | Freq: Every day | ORAL | Status: DC
  Administered 2024-10-21 – 2024-11-11 (×23): 250 mg via ORAL
  Filled 2024-10-21 (×22): qty 1

## 2024-10-21 MED ORDER — ENSURE PLUS HIGH PROTEIN PO LIQD: 237.0000 mL | Freq: Two times a day (BID) | ORAL | Status: DC

## 2024-10-21 NOTE — Assessment & Plan Note (Addendum)
 Pulmonary was on board. Completed a course of antibiotics and prednisone . - Continue with bronchodilator

## 2024-10-21 NOTE — Progress Notes (Signed)
 Introduced patient to role of statistician. Intake questions completed. Patient appears quite anxious throughout visit at bedside. Patient denies any active SI or HI. Patient does endorse feeling depressed and overwhelmed with current life situation. Patient does NOT have a current PCP, but is agreeable to the establishment of one. Patient is currently homeless, and has been so consistently for the last 6 months. Prior to this he was living off and on in a boarding house on [address]. Patient endorses having stayed at [facility] and [facility] last winter, during the time period he was out of the boarding house.  Previously employed working on art gallery manager. Patient states breathing difficulty caused him to be unable to work efficiently and consistently so he was fired. Requesting assistance with starting SSI process. Financial Navigator Tiffany made aware. Patient does have family in the area, but states none of them are possibilities for housing. He has two sisters, both of whom he is estranged from. He does endorse receiving financial help from time to time from his son, who also lives in [place]. Patient's daughter lives in [place].  Patient does NOT currently have his ID or Social Security card and thus can not d/c to a shelter.  Bedside team made aware of all of the above. Marissa, Case Manager to reach out to [employer] to see what assistance may be available from them.  Encouraged to call with questions or concerns.

## 2024-10-21 NOTE — Assessment & Plan Note (Addendum)
 Improved.  Tylenol  as needed.  IV magnesium  given on 11/5.

## 2024-10-21 NOTE — Assessment & Plan Note (Signed)
 Must stop smoking

## 2024-10-21 NOTE — Assessment & Plan Note (Signed)
 Resolved

## 2024-10-21 NOTE — Consult Note (Signed)
 PULMONOLOGY         Date: 10/21/2024,   MRN# [id number] Corey House. 10-25-1966     AdmissionWeight: 58.8 kg                 CurrentWeight: 53.7 kg  Referring provider: Dr Josette   CHIEF COMPLAINT:   Acute exacerbation of CHF and COPD   HISTORY OF PRESENT ILLNESS   This is a 58 yo M with hx of chronic cough, COPD, CHF, Urolithiasis, tobacco dependence who came in due to worsening SOB.  [name] hypoxemic requiring HHFNC at 15L/min to reach normoxia.  Since admission he has partially improved with weaning down of O2 req.  He had CT chest with PE protocol and was found to have chronic bronchitic COPD and centrilobular emphysema with no venous thromboemoblism.  He also had noted RV dilation suggestive of RV failure.  Cardiac biomarkers were mildy elevated consisted with acute on chronic CHF. He had septic workup performed and had steroids with antibiotics prescribed. He also had DVT studies and this was negative for bilateral LE dvt.  Patient requested pulmonary evaluation and for this reason consultation was placed.    PAST MEDICAL HISTORY   Past Medical History:  Diagnosis Date   Chronic cough    Urolithiasis    Urolithiasis      SURGICAL HISTORY   Past Surgical History:  Procedure Laterality Date   CATARACT EXTRACTION W/PHACO Left 08/09/2021   Procedure: CATARACT EXTRACTION PHACO AND INTRAOCULAR LENS PLACEMENT (IOC) LEFT .051 00:10.0;  Surgeon: Mittie Gaskin, MD;  Location: [facility];  Service: Ophthalmology;  Laterality: Left;   CATARACT EXTRACTION W/PHACO Right 10/04/2021   Procedure: CATARACT EXTRACTION PHACO AND INTRAOCULAR LENS PLACEMENT (IOC) RIGHT 0.67 00:13.8;  Surgeon: Mittie Gaskin, MD;  Location: [facility];  Service: Ophthalmology;  Laterality: Right;  general anesthesia needs to be last case   HERNIA REPAIR     x2   MANDIBLE FRACTURE SURGERY       FAMILY HISTORY   History reviewed. No  pertinent family history.   SOCIAL HISTORY   Social History   Tobacco Use   Smoking status: Every Day    Current packs/day: 1.00    Average packs/day: 1 pack/day for 35.0 years (35.0 ttl pk-yrs)    Types: Cigarettes   Smokeless tobacco: Never   Tobacco comments:    Started smoking age 66  Vaping Use   Vaping status: Never Used  Substance Use Topics   Alcohol use: No   Drug use: Not Currently     MEDICATIONS    Home Medication:    Current Medication:  Current Facility-Administered Medications:    acetaminophen  (TYLENOL ) tablet 650 mg, 650 mg, Oral, Q6H PRN, Josette Ade, MD, 650 mg at 10/21/24 1158   albuterol  (PROVENTIL ) (2.5 MG/3ML) 0.083% nebulizer solution 2.5 mg, 2.5 mg, Nebulization, Q2H PRN, Foust, Katy L, NP   artificial tears ophthalmic solution 1 drop, 1 drop, Both Eyes, PRN, Foust, Katy L, NP   enoxaparin  (LOVENOX ) injection 40 mg, 40 mg, Subcutaneous, Q24H, Foust, Katy L, NP, 40 mg at 10/20/24 1751   feeding supplement (ENSURE PLUS HIGH PROTEIN) liquid 237 mL, 237 mL, Oral, BID BM, Wieting, Richard, MD   furosemide (LASIX) injection 40 mg, 40 mg, Intravenous, BID, Foust, Katy L, NP, 40 mg at 10/21/24 0958   guaiFENesin  (MUCINEX ) 12 hr tablet 600 mg, 600 mg, Oral, BID, Foust, Katy L, NP, 600 mg at 10/21/24  9040   ipratropium-albuterol  (DUONEB) 0.5-2.5 (3) MG/3ML nebulizer solution 3 mL, 3 mL, Nebulization, BID, Alexander, Natalie, DO, 3 mL at 10/21/24 1114   magnesium  sulfate IVPB 2 g 50 mL, 2 g, Intravenous, Once, Josette Ade, MD, Last Rate: 50 mL/hr at 10/21/24 1203, 2 g at 10/21/24 1203   melatonin tablet 5 mg, 5 mg, Oral, QHS PRN, Foust, Katy L, NP   methylPREDNISolone  sodium succinate (SOLU-MEDROL ) 40 mg/mL injection 40 mg, 40 mg, Intravenous, Q12H, Foust, Katy L, NP, 40 mg at 10/21/24 0959   multivitamin with minerals tablet 1 tablet, 1 tablet, Oral, Daily, Wieting, Richard, MD, 1 tablet at 10/21/24 1113   nicotine  (NICODERM CQ  - dosed in mg/24  hours) patch 14 mg, 14 mg, Transdermal, Daily, Foust, Katy L, NP, 14 mg at 10/21/24 1002   nicotine  polacrilex (NICORETTE) gum 2 mg, 2 mg, Oral, PRN, Foust, Katy L, NP   pantoprazole (PROTONIX) EC tablet 40 mg, 40 mg, Oral, Daily, Foust, Katy L, NP, 40 mg at 10/21/24 0959    ALLERGIES   Patient has no known allergies.     REVIEW OF SYSTEMS    Review of Systems:  Gen:  Denies  fever, sweats, chills weigh loss  HEENT: Denies blurred vision, double vision, ear pain, eye pain, hearing loss, nose bleeds, sore throat Cardiac:  No dizziness, chest pain or heaviness, chest tightness,edema Resp:   reports dyspnea chronically  Gi: Denies swallowing difficulty, stomach pain, nausea or vomiting, diarrhea, constipation, bowel incontinence Gu:  Denies bladder incontinence, burning urine Ext:   Denies Joint pain, stiffness or swelling Skin: Denies  skin rash, easy bruising or bleeding or hives Endoc:  Denies polyuria, polydipsia , polyphagia or weight change Psych:   Denies depression, insomnia or hallucinations   Other:  All other systems negative   VS: BP 104/79 (BP Location: Left Arm)   Pulse 87   Temp 98.5 F (36.9 C) (Oral)   Resp 18   Ht 5' 4 (1.626 m)   Wt 53.7 kg   SpO2 95%   BMI 20.32 kg/m      PHYSICAL EXAM    GENERAL:NAD, no fevers, chills, no weakness no fatigue HEAD: Normocephalic, atraumatic.  EYES: Pupils equal, round, reactive to light. Extraocular muscles intact. No scleral icterus.  MOUTH: Moist mucosal membrane. Dentition intact. No abscess noted.  EAR, NOSE, THROAT: Clear without exudates. No external lesions.  NECK: Supple. No thyromegaly. No nodules. No JVD.  PULMONARY: decreased breath sounds with mild rhonchi worse at bases bilaterally.  CARDIOVASCULAR: S1 and S2. Regular rate and rhythm. No murmurs, rubs, or gallops. No edema. Pedal pulses 2+ bilaterally.  GASTROINTESTINAL: Soft, nontender, nondistended. No masses. Positive bowel sounds. No  hepatosplenomegaly.  MUSCULOSKELETAL: No swelling, clubbing, or edema. Range of motion full in all extremities.  NEUROLOGIC: Cranial nerves II through XII are intact. No gross focal neurological deficits. Sensation intact. Reflexes intact.  SKIN: No ulceration, lesions, rashes, or cyanosis. Skin warm and dry. Turgor intact.  PSYCHIATRIC: Mood, affect within normal limits. The patient is awake, alert and oriented x 3. Insight, judgment intact.       IMAGING     rative & Impression  EXAM: CTA of the Chest with contrast for PE 10/20/2024 02:19:01 PM   TECHNIQUE: CTA of the chest was performed without and with the administration of 75 mL of iohexol  (OMNIPAQUE ) 350 MG/ML injection. Multiplanar reformatted images are provided for review. MIP images are provided for review. Automated exposure control, iterative reconstruction, and/or weight based  adjustment of the mA/kV was utilized to reduce the radiation dose to as low as reasonably achievable.   COMPARISON: Same day x-ray and CTA chest 01/04/2023.   CLINICAL HISTORY: Pulmonary embolism (PE) suspected, high prob.   FINDINGS:   PULMONARY ARTERIES: Pulmonary arteries are adequately opacified for evaluation. Negative for pulmonary embolism. Main pulmonary artery is normal in caliber.   MEDIASTINUM: Dilated right ventricle with reflux of contrast into the IVC and hepatic veins compatible with elevated right heart pressures. There is no acute abnormality of the thoracic aorta.   LYMPH NODES: No mediastinal, hilar or axillary lymphadenopathy.   LUNGS AND PLEURA: Diffuse bronchial wall thickening. Mucous plugging in the lower lobes. Emphysema. No focal consolidation or pulmonary edema. No pleural effusion or pneumothorax.   UPPER ABDOMEN: Limited images of the upper abdomen are unremarkable.   SOFT TISSUES AND BONES: No acute bone or soft tissue abnormality.   IMPRESSION: 1. No pulmonary embolism. 2. Dilated right  ventricle with reflux of contrast into the IVC and hepatic veins, compatible with elevated right heart pressures. 3. Chronic bronchitis. 4. Emphysema. Given patient age, consider evaluation for low-dose CT lung cancer screening.   Electronically signed by: Norman Gatlin MD 10/20/2024 02:49 PM EST RP Workstation: [id number]      ASSESSMENT/PLAN     Acute exacerbation of COPD   - continue nebulizer therapy Duoneb 16h prn   - s/p solumedrol 125x1 followed by 40mg  IV daily   -adding zithromax  PO - PT/OT -Incentive spirometry and flutter valve    Mucus plugging of tracheobronchial tree    - considering outpatient bronchoscopy     -mucomyst 4ml 20% BID while hospitalized   Tobacco dependence   - 14mg  transdermal patch     Enlarged Right ventricle Suggestive of pulmonary hypertension - would recommend RHC via cardiology service when possible non urgently    Psychiatric history   Patient is currently being evaluated by psychiatrist                Thank you for allowing me to participate in the care of this patient.   Patient/Family are satisfied with care plan and all questions have been answered.    Provider disclosure: Patient with at least one acute or chronic illness or injury that poses a threat to life or bodily function and is being managed actively during this encounter.  All of the below services have been performed independently by signing provider:  review of prior documentation from internal and or external health records.  Review of previous and current lab results.  Interview and comprehensive assessment during patient visit today. Review of current and previous chest radiographs/CT scans. Discussion of management and test interpretation with health care team and patient/family.   This document was prepared using Dragon voice recognition software and may include unintentional dictation errors.     Christol Thetford, M.D.  Division of Pulmonary &  Critical Care Medicine

## 2024-10-21 NOTE — Progress Notes (Signed)
 Initial Nutrition Assessment  DOCUMENTATION CODES:   Severe malnutrition in context of social or environmental circumstances  INTERVENTION:   -Liberalize diet to 2 gram sodium for wider variety of meal selections -MVI with minerals daily -Ensure Plus High Protein po BID, each supplement provides 350 kcal and 20 grams of protein  -RD provided Low Sodium Nutrition Therapy handout from [facility] Nutrition Care Manual; attached to AVS/ discharge summary  NUTRITION DIAGNOSIS:   Severe Malnutrition related to social / environmental circumstances as evidenced by moderate fat depletion, severe fat depletion, moderate muscle depletion, severe muscle depletion, edema.  GOAL:   Patient will meet greater than or equal to 90% of their needs  MONITOR:   PO intake, Supplement acceptance  REASON FOR ASSESSMENT:   Malnutrition Screening Tool    ASSESSMENT:   58 y.o. year old male with past medical history of  tobacco use disorder reporting a 41 year pack per year history, smoking ~1 pack per day since he was 58 years old. He denies any additional medical history though he has not seen a medical provider in ~20 years. He presents with shortness of breath that began ~ 9 months ago and worsened ~3 months ago PTA. He reports today his dyspnea is not significantly different than it was 3 months ago PTA. He endorses a chronic cough that has been present since childhood.  Patient admitted with acute hypoxic respiratory failure and CHF/ COPD exacerbation.   Reviewed I/O's: -1.2 L x 24 hours  UOP: 1.3 L x 24 hours  Patient sitting up in bed at time of visit. He was alert and talkative at time of visit, sharing stories about current living situations and previous jobs that he held. Difficult to obtain history, especially with multiple providers seeing patient at time of visit.   Patient reports good appetite. Noted meal completions 100%.   Patient has been homeless over the past 6 months. Financial  navigator assisting with SSI process.   No weight loss noted over the past 6 months. Noted patient with moderate edema, which may be masking true weight loss as well as further fat and muscle depletions. Patient would benefit from addition of oral nutrition supplements.   Medications reviewed and include lasix, solu-medrol , and protonix.   Labs reviewed: Na: 147.    NUTRITION - FOCUSED PHYSICAL EXAM:  Flowsheet Row Most Recent Value  Orbital Region Severe depletion  Upper Arm Region Moderate depletion  Thoracic and Lumbar Region Severe depletion  Buccal Region Moderate depletion  Temple Region Severe depletion  Clavicle Bone Region Severe depletion  Clavicle and Acromion Bone Region Severe depletion  Scapular Bone Region Severe depletion  Dorsal Hand Moderate depletion  Patellar Region Moderate depletion  Anterior Thigh Region Moderate depletion  Posterior Calf Region Moderate depletion  Edema (RD Assessment) Moderate  Hair Reviewed  Eyes Reviewed  Mouth Reviewed  Skin Reviewed  Nails Reviewed    Diet Order:   Diet Order             Diet 2 gram sodium Fluid consistency: Thin  Diet effective now                   EDUCATION NEEDS:   Education needs have been addressed  Skin:  Skin Assessment: Reviewed RN Assessment  Last BM:  10/19/24  Height:   Ht Readings from Last 1 Encounters:  10/20/24 5' 4 (1.626 m)    Weight:   Wt Readings from Last 1 Encounters:  10/21/24 53.7 kg  Ideal Body Weight:  59.1 kg  BMI:  Body mass index is 20.32 kg/m.  Estimated Nutritional Needs:   Kcal:  1950-2150  Protein:  105-120 grams  Fluid:  1.9-2.1 L    Margery ORN, RD, LDN, CDCES Registered Dietitian III Certified Diabetes Care and Education Specialist If unable to reach this RD, please use RD Inpatient group chat on secure chat between hours of 8am-4 pm daily

## 2024-10-21 NOTE — Hospital Course (Addendum)
 Hospital course / significant events:   58 y.o. year old male with past medical history of  tobacco use disorder reporting a 41 year pack per year history, smoking ~1 pack per day since he was 58 years old. He denies any additional medical history though he has not seen a medical provider in ~20 years. He presents to [facility] ED with shortness of breath that began ~ 9 months ago and worsened ~3 months ago. He reports today his dyspnea is not significantly different than it was 3 months ago. He endorses a chronic cough that has been present since childhood. Her ports the cough is currently, minimally productive with white phlegm. Additionally, he endorses bilateral lower extremity swelling that began in the last 2 days, per his report (R) > (L). On exam the swelling is equal bilaterally. He endorses fatigue and dyspnea that is worse with position changes such as bending over and when standing from lying. He denies chest pain, palpitations, or fever.   ED Course: On arrival to [facility] ED patient was noted to be afebrile temp 36.8C, BP 142/111, HR 88, RR 22, SPO2 100% on 15L non-rebreather. Patient now on 2L via nasal cannula with SPO2 96%. CXR obtained and shows non specific findings of increased interstitial markings. CTA PE obtained and shows no PE, dilated RV and reflux of contract into the IVC and hepatic vein consistent with (R) heart failure, chronic bronchitis, and emphysema. US  DVT (B)LE negative for DVT. Labs notable Negative COVID, flu, RSV,for BNP 284.2, BUN 24, troponin 41--> 38 with no report of chest pain.  Lactic acid 0.9.  Blood cultures drawn and in process.  He was given Lasix , DuoNebs, and Solu-Medrol .    Patient was treated in the hospital, condition has improved. See A/P belwo for problem based course. Currently just pending for placement.  CXR for TB r/o abn. CT chest with concern for pneumonia. 01/10 pt reported no increased SOB or coughing but more fatigued for few  days. Started augmentin  + azithro, advised increased IS use and increased ambulation to encourage ventilation. CT images reviewed, PNA areas of concern lower posterior lungs with questionable persistent atelectasis / hypoventilation that may increase risk for infection. PPD negative. On 1/12, pt reported feeling bad with more SOB, fatigue, and jitteriness when xanax  wearing off. Switched augmentin  to IV rocephin , give 1L fluids, duonebs scheduled, steroids, held anoro. Lung exam no major concerns. 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 Clinically improved. Antibiotic course finished. Medically ready pending placement. 1/15.  TOC hopeful we can arrange placement for the patient soon. 1/17.  TOC will have to find another place for him.

## 2024-10-21 NOTE — Assessment & Plan Note (Addendum)
 EF 55 to 60%.  Initially on IV Lasix.  On 11/6 and gave a dose of acetazolamide with CO2 elevation on chemistry.  Will continue low-dose p.o Lasix.  CT scan commenting on elevated right heart pressures.

## 2024-10-21 NOTE — TOC Initial Note (Signed)
 Transition of Care (TOC) - Initial/Assessment Note    Patient Details  Name: Corey House. MRN: [id number] Date of Birth: 1966-06-11  Transition of Care [facility]) CM/SW Contact:    Daved JONETTA Hamilton, RN Phone Number: 10/21/2024, 11:22 AM  Clinical Narrative:                  Patient verbally reports he is currently homeless, has no source of income, and lost his ID and Social Security card. Per note from Nurse Navigator, the Financial Navigator has been made aware patient is requesting assistance with starting the SSI process. This CM spoke with patients insurance [employer] regarding available resources. [employer] advised if patient opts into their Tailored Care Management program, he will be assigned a CM and possibly a nurse that will reach out to him to discuss available resources, and that patient must have a phone where he can be available to speak with.   Spoke with patient, introduced self, explained role. Patient verbalized agreement to speak with me. Discussed with patient the [employer] option for Tailored Care Management, patient verbalized agreement to this. This CM verified patients new cell phone is working and available for contact.   Spoke with [employer], informed that patient has opted in to Tailored Care Management. [employer] advised because patient is currently admitted to hospital, she will process as High Priority and patient should expect to hear from them in 1-2 days. [employer] also advised they will place housing referral.   TOC will continue to follow.   Expected Discharge Plan:  (patient is currently homeless, unsure of discharge location at this time) Barriers to Discharge: Continued Medical Work up, Other (must enter comment) (homeless)   Patient Goals and CMS Choice            Expected Discharge Plan and Services   Discharge Planning Services: CM Consult   Living arrangements for the past 2 months: Homeless                                      Prior Living  Arrangements/Services Living arrangements for the past 2 months: Homeless   Patient language and need for interpreter reviewed:: Yes        Need for Family Participation in Patient Care: No (Comment) Care giver support system in place?: No (comment) (patient is homeless with limited resources)   Criminal Activity/Legal Involvement Pertinent to Current Situation/Hospitalization: No - Comment as needed  Activities of Daily Living   ADL Screening (condition at time of admission) Independently performs ADLs?: Yes (appropriate for developmental age) Is the patient deaf or have difficulty hearing?: No Does the patient have difficulty seeing, even when wearing glasses/contacts?: No Does the patient have difficulty concentrating, remembering, or making decisions?: No  Permission Sought/Granted Permission sought to share information with : Case Manager, Other (comment) Advertising Account Planner) Permission granted to share information with : Yes, Verbal Permission Granted  Share Information with NAME: TOC, [employer]           Emotional Assessment Appearance:: Appears stated age Attitude/Demeanor/Rapport: Engaged Affect (typically observed): Appropriate Orientation: : Oriented to Self, Oriented to Place, Oriented to  Time, Oriented to Situation Alcohol / Substance Use: Not Applicable Psych Involvement: Yes (comment) (a Psych consult has been requested)  Admission diagnosis:  Emphysema lung (HCC) [J43.9] Acute respiratory failure with hypoxia (HCC) [J96.01] Acute hypoxic respiratory failure (HCC) [J96.01] Patient Active Problem List   Diagnosis  Date Noted   Acute hypoxic respiratory failure (HCC) 10/20/2024   Acute on chronic combined systolic and diastolic CHF (congestive heart failure) (HCC) 10/20/2024   Anxiety state 04/23/2024   MDD (major depressive disorder), recurrent episode, severe (HCC) 04/23/2024   Buprenorphine  and naloxone  withdrawal (HCC) 12/31/2023   Depression 12/28/2023    Benzodiazepine abuse (HCC) 12/28/2023   Suicidal ideations 12/28/2023   Major depressive disorder, recurrent episode, moderate (HCC) 01/04/2023   Pressure injury of skin 01/04/2023   Protein-calorie malnutrition, severe 01/04/2023   COPD with acute exacerbation (HCC) 01/03/2023   Tobacco use disorder 01/03/2023   Acute respiratory failure with hypoxia (HCC) 01/03/2023   Cough 01/03/2023   PCP:  Patient, [name] Pharmacy:   [id number] - [place], [place] - [address] [place] [address] Phone: [phone] Fax: [phone]  [id number] - [place], [place] - [address] [place] [address] Phone: [phone] Fax: [phone]  [facility] [address] [place] [address] Phone: [phone] Fax: [phone]     Social Drivers of Health (SDOH) Social History: SDOH Screenings   Food Insecurity: Food Insecurity Present (10/20/2024)  Housing: High Risk (10/20/2024)  Transportation Needs: Unmet Transportation Needs (10/20/2024)  Utilities: At Risk (10/20/2024)  Alcohol Screen: Low Risk  (04/23/2024)  Social Connections: Unknown (04/23/2024)  Tobacco Use: High Risk (06/20/2024)   SDOH Interventions:     Readmission Risk Interventions     No data to display

## 2024-10-21 NOTE — Progress Notes (Signed)
 Progress Note   Patient: Corey House. FMW:[id number] DOB: 09/21/1966 DOA: 10/20/2024     1 DOS: the patient was seen and examined on 10/21/2024   Brief hospital course: 58 y.o. year old male with past medical history of  tobacco use disorder reporting a 41 year pack per year history, smoking ~1 pack per day since he was 58 years old. He denies any additional medical history though he has not seen a medical provider in ~20 years. He presents to [facility] ED with shortness of breath that began ~ 9 months ago and worsened ~3 months ago. He reports today his dyspnea is not significantly different than it was 3 months ago. He endorses a chronic cough that has been present since childhood. Her ports the cough is currently, minimally productive with white phlegm. Additionally, he endorses bilateral lower extremity swelling that began in the last 2 days, per his report (R) > (L). On exam the swelling is equal bilaterally. He endorses fatigue and dyspnea that is worse with position changes such as bending over and when standing from lying. He denies chest pain, palpitations, or fever.   ED Course: On arrival to [facility] ED patient was noted to be afebrile temp 36.8C, BP 142/111, HR 88, RR 22, SPO2 100% on 15L non-rebreather. Patient now on 2L via nasal cannula with SPO2 96%. CXR obtained and shows non specific findings of increased interstitial markings. CTA PE obtained and shows no PE, dilated RV and reflux of contract into the IVC and hepatic vein consistent with (R) heart failure, chronic bronchitis, and emphysema. US  DVT (B)LE negative for DVT. Labs notable Negative COVID, flu, RSV,for BNP 284.2, BUN 24, troponin 41--> 38 with no report of chest pain.  Lactic acid 0.9.  Blood cultures drawn and in process.  He was given Lasix, DuoNebs, and Solu-Medrol .  TRH contacted for admission.  11/5.  Pulse ox with ambulation down to 85%.  Patient admitted with COPD and CHF exacerbation.  Patient  also states that he is short of breath and he is waiting to die.  Appreciate psychiatric consultation.  Patient is homeless.  Assessment and Plan: * Acute hypoxic respiratory failure (HCC) Pulse ox dropped to 85% today.  The patient is homeless will need to be able to come off oxygen prior to disposition.  COPD with acute exacerbation (HCC) Patient on Zithromax , Solu-Medrol  and nebulizer treatments.  Acute on chronic diastolic CHF (congestive heart failure) (HCC) EF 55 to 60%.  Continue with Lasix.  CT scan commenting on elevated right heart pressures.  Tobacco use disorder Must stop smoking.  Headache Tylenol  ordered.  IV magnesium .  Increased anion gap metabolic acidosis Continue to monitor  Hypernatremia Must eat and drink better.        Subjective: Patient stated he had the shortness of breath going on for a long time.  Stated he is tired and ready to pass away.  He states he is not suicidal.  Just frustrated about his situation.  He is homeless.  Physical Exam: Vitals:   10/21/24 0451 10/21/24 0500 10/21/24 0815 10/21/24 1156  BP: 117/80  112/79 104/79  Pulse: 84  84 87  Resp: 20  17 18   Temp:   98.6 F (37 C) 98.5 F (36.9 C)  TempSrc:   Oral Oral  SpO2: 97%  92% 95%  Weight:  53.7 kg    Height:       Physical Exam HENT:     Head: Normocephalic.  Mouth/Throat:     Pharynx: No oropharyngeal exudate.  Eyes:     General: Lids are normal.     Conjunctiva/sclera: Conjunctivae normal.  Cardiovascular:     Rate and Rhythm: Normal rate and regular rhythm.     Heart sounds: Normal heart sounds, S1 normal and S2 normal.  Pulmonary:     Breath sounds: No decreased breath sounds, wheezing, rhonchi or rales.  Abdominal:     Palpations: Abdomen is soft.     Tenderness: There is no abdominal tenderness.  Musculoskeletal:     Right lower leg: Swelling present.     Left lower leg: Swelling present.  Skin:    General: Skin is warm.     Findings: No rash.   Neurological:     Mental Status: He is alert and oriented to person, place, and time.     Data Reviewed: Sodium 147, anion gap 16, CO2 34, white blood cell count 3.3, hemoglobin 14.8, lactic acid 1.5  Family Communication: Tried to reach patient's daughter but I got hung up on  Disposition: Status is: Inpatient Remains inpatient appropriate because: Patient will need to be off oxygen prior to disposition since he is homeless.  Continue Solu-Medrol  and Lasix.  Planned Discharge Destination: To the street    Time spent: 28 minutes  Author: Charlie Patterson, MD 10/21/2024 2:13 PM  For on call review [email or web address].

## 2024-10-21 NOTE — Evaluation (Addendum)
 Physical Therapy Evaluation Patient Details Name: Corey House. MRN: [id number] DOB: [birth year] Today's Date: 10/21/2024  History of Present Illness  Pt is a 58 y.o. year old male with past medical history of tobacco use. He presents to [facility] ED with shortness of breath. He has dyspnea at rest and on exertion with a chronic cough that has been present since childhood. MD dx includes: Emphysema and hypoxia.    Clinical Impression  Pt was not motivated to participate during the session, but put forth good effort throughout. Pt was found in bed, AO x4 at the beginning of the session. Pt expressed difficulty with breathing (see O2 comments below) and his struggles with homelessness. On arrival, pt on 2L SpO2 at 90 and dropped to  the mid 80s during transitional movements. Brief RA trial caused additional drops with modest bed mobility. Pt was able to ambulate with 3L of O2 approx 250 ft with occasional pauses/rest breaks to activate pursed lip breathing. Pt occasionally staggered outside BOS but was able to self correct. Pt was DOE and not at normal baseline for activity tolerance. Pt will benefit from mobility services during his time here to safely address deficits listed in patient problem list for eventual return to PLOF.        If plan is discharge home, recommend the following:     Can travel by private vehicle        Equipment Recommendations None recommended by PT  Recommendations for Other Services       Functional Status Assessment Patient has had a recent decline in their functional status and demonstrates the ability to make significant improvements in function in a reasonable and predictable amount of time.     Precautions / Restrictions Precautions Precautions: None Restrictions Weight Bearing Restrictions Per Provider Order: No      Mobility  Bed Mobility Overal bed mobility: Independent                  Transfers Overall transfer level:  Independent                 General transfer comment: pt was fully independent with transfers no AD    Ambulation/Gait Ambulation/Gait assistance: Independent Gait Distance (Feet): 250 Feet Assistive device: None Gait Pattern/deviations: WFL(Within Functional Limits) Gait velocity: mildly decreased due to O2 deficits     General Gait Details: occasional LOB outside BOS but able to self correct  Stairs            Wheelchair Mobility     Tilt Bed    Modified Rankin (Stroke Patients Only)       Balance Overall balance assessment: Independent                                           Pertinent Vitals/Pain Pain Assessment Pain Assessment: No/denies pain    Home Living Family/patient expects to be discharged to:: Shelter/Homeless                   Additional Comments: Pt has been experiencing homelessness for approx 6 months    Prior Function Prior Level of Function : Independent/Modified Independent             Mobility Comments: prior functional level -- could walk around a mile a day but recently needing rest breaks every 100 ft.  ADLs Comments: Pt is  indepedent with ADLs, but does not feel safe ambulating on a day to day basis/exerting himself due to O2 levels dropping.     Extremity/Trunk Assessment                Communication   Communication Communication: No apparent difficulties    Cognition Arousal: Alert Behavior During Therapy: WFL for tasks assessed/performed   PT - Cognitive impairments: No apparent impairments                         Following commands: Intact       Cueing Cueing Techniques: Verbal cues     General Comments On arrival, pt on 2L SpO2 at 90 and dropped to  the mid 80s during transitional movements. Brief RA trial caused additional drops with modest bed mobility. Required 3L to maintain greater than 90 of O2 approx 250 ft with occasional pauses/rest breaks to activate  pursed lip breathing.     Exercises     Assessment/Plan    PT Assessment Patient needs continued PT services  PT Problem List Decreased balance;Cardiopulmonary status limiting activity       PT Treatment Interventions Functional mobility training;Therapeutic activities;Therapeutic exercise;Balance training;Gait training    PT Goals (Current goals can be found in the Care Plan section)  Acute Rehab PT Goals Patient Stated Goal: breathing more easily with ambulation PT Goal Formulation: With patient Time For Goal Achievement: 11/03/24 Potential to Achieve Goals: Good    Frequency Min 1X/week     Co-evaluation               AM-PAC PT 6 Clicks Mobility  Outcome Measure Help needed turning from your back to your side while in a flat bed without using bedrails?: None Help needed moving from lying on your back to sitting on the side of a flat bed without using bedrails?: None Help needed moving to and from a bed to a chair (including a wheelchair)?: None Help needed standing up from a chair using your arms (e.g., wheelchair or bedside chair)?: None Help needed to walk in hospital room?: None Help needed climbing 3-5 steps with a railing? : None 6 Click Score: 24    End of Session Equipment Utilized During Treatment: Gait belt Activity Tolerance: Patient tolerated treatment well Patient left: in bed;with call bell/phone within reach   PT Visit Diagnosis: Difficulty in walking, not elsewhere classified (R26.2);Unsteadiness on feet (R26.81)    Time: 9170-9098 PT Time Calculation (min) (ACUTE ONLY): 32 min   Charges:               This entire session was performed under direct supervision and direction of a licensed therapist/therapist assistant. I have personally read, edited and approve of the note as written.  Carmin Deed, DPT  [name], SPT 10/21/24, 10:03 AM

## 2024-10-21 NOTE — Discharge Instructions (Addendum)
 Your nurse navigator, Tiffanie, can be reached at [phone]  [facility] Transportation [phone]     Please call to schedule pick up/drop off 3-5 days in advance of medical appointment  During your hospitalization, the process for your SSI was started via our financial navigator. There was also a referral placed for the [facility] in [place] should be reaching out to you. [phone] is the number for the rapid-rehousing coordinated intake   [facility] [address] [place], [place] [address] Nurse Clinics Tues, [place], Thurs 8:30am to 12:30pm     Low Sodium Nutrition Therapy  Eating less sodium can help you if you have high blood pressure, heart failure, or kidney or liver disease.   Your body needs a little sodium, but too much sodium can cause your body to hold onto extra water . This extra water  will raise your blood pressure and can cause damage to your heart, kidneys, or liver as they are forced to work harder.   Sometimes you can see how the extra fluid affects you because your hands, legs, or belly swell. You may also hold water  around your heart and lungs, which makes it hard to breathe.   Even if you take medication for blood pressure or a water  pill (diuretic) to remove fluid, it is still important to have less salt in your diet.   Check with your primary care provider before drinking alcohol  since it may affect the amount of fluid in your body and how your heart, kidneys, or liver work. Sodium in Food A low-sodium meal plan limits the sodium that you get from food and beverages to 1,500-2,000 milligrams (mg) per day. Salt is the main source of sodium. Read the nutrition label on the package to find out how much sodium is in one serving of a food.  Select foods with 140 milligrams (mg) of sodium or less per serving.  You may be able to eat one or two servings of foods with a little more than 140 milligrams (mg) of sodium if you are closely watching how much  sodium you eat in a day.  Check the serving size on the label. The amount of sodium listed on the label shows the amount in one serving of the food. So, if you eat more than one serving, you will get more sodium than the amount listed.  Tips Cutting Back on Sodium Eat more fresh foods.  Fresh fruits and vegetables are low in sodium, as well as frozen vegetables and fruits that have no added juices or sauces.  Fresh meats are lower in sodium than processed meats, such as bacon, sausage, and hotdogs.  Not all processed foods are unhealthy, but some processed foods may have too much sodium.  Eat less salt at the table and when cooking. One of the ingredients in salt is sodium.  One teaspoon of table salt has 2,300 milligrams of sodium.  Leave the salt out of recipes for pasta, casseroles, and soups. Be a engineer, building services.  Food packages that say Salt-free, sodium-free, very low sodium, and low sodium have less than 140 milligrams of sodium per serving.  Beware of products identified as Unsalted, No Salt Added, Reduced Sodium, or Lower Sodium. These items may still be high in sodium. You should always check the nutrition label. Add flavors to your food without adding sodium.  Try lemon juice, lime juice, or vinegar.  Dry or fresh herbs add flavor.  Buy a sodium-free seasoning blend or make your own at home. You  can purchase salt-free or sodium-free condiments like barbeque sauce in stores and online. Ask your registered dietitian nutritionist for recommendations and where to find them.   Eating in Restaurants Choose foods carefully when you eat outside your home. Restaurant foods can be very high in sodium. Many restaurants provide nutrition facts on their menus or their websites. If you cannot find that information, ask your server. Let your server know that you want your food to be cooked without salt and that you would like your salad dressing and sauces to be served on the side.     Foods Recommended Food Group Foods Recommended  Grains Bread, bagels, rolls without salted tops Homemade bread made with reduced-sodium baking powder Cold cereals, especially shredded wheat and puffed rice Oats, grits, or cream of wheat Pastas, quinoa, and rice Popcorn, pretzels or crackers without salt Corn tortillas  Protein Foods Fresh meats and fish; turkey bacon (check the nutrition labels - make sure they are not packaged in a sodium solution) Canned or packed tuna (no more than 4 ounces at 1 serving) Beans and peas Soybeans) and tofu Eggs Nuts or nut butters without salt  Dairy Milk or milk powder Plant milks, such as rice and soy Yogurt, including Greek yogurt Small amounts of natural cheese (blocks of cheese) or reduced-sodium cheese can be used in moderation. (Swiss, ricotta, and fresh mozzarella cheese are lower in sodium than the others) Cream Cheese Low sodium cottage cheese  Vegetables Fresh and frozen vegetables without added sauces or salt Homemade soups (without salt) Low-sodium, salt-free or sodium-free canned vegetables and soups  Fruit Fresh and canned fruits Dried fruits, such as raisins, cranberries, and prunes  Oils Tub or liquid margarine, regular or without salt Canola, corn, peanut, olive, safflower, or sunflower oils  Condiments Fresh or dried herbs such as basil, bay leaf, dill, mustard (dry), nutmeg, paprika, parsley, rosemary, sage, or thyme.  Low sodium ketchup Vinegar  Lemon or lime juice Pepper, red pepper flakes, and cayenne. Hot sauce contains sodium, but if you use just a drop or two, it will not add up to much.  Salt-free or sodium-free seasoning mixes and marinades Simple salad dressings: vinegar and oil   Foods Not Recommended Food Group Foods Not Recommended  Grains Breads or crackers topped with salt Cereals (hot/cold) with more than 300 mg sodium per serving Biscuits, cornbread, and other quick breads prepared with baking  soda Pre-packaged bread crumbs Seasoned and packaged rice and pasta mixes Self-rising flours  Protein Foods Cured meats: Bacon, ham, sausage, pepperoni and hot dogs Canned meats (chili, vienna sausage, or sardines) Smoked fish and meats Frozen meals that have more than 600 mg of sodium per serving Egg substitute (with added sodium)  Dairy Buttermilk Processed cheese spreads Cottage cheese (1 cup may have over 500 mg of sodium; look for low-sodium.) American or feta cheese Shredded Cheese has more sodium than blocks of cheese String cheese  Vegetables Canned vegetables (unless they are salt-free, sodium-free or low sodium) Frozen vegetables with seasoning and sauces Sauerkraut and pickled vegetables Canned or dried soups (unless they are salt-free, sodium-free, or low sodium) French fries and onion rings  Fruit Dried fruits preserved with additives that have sodium  Oils Salted butter or margarine, all types of olives  Condiments Salt, sea salt, kosher salt, onion salt, and garlic salt Seasoning mixes with salt Bouillon cubes Ketchup Barbeque sauce and Worcestershire sauce unless low sodium Soy sauce Salsa, pickles, olives, relish Salad dressings: ranch, blue cheese, Italian, and  French.   Low Sodium Sample 1-Day Menu  Breakfast 1 cup cooked oatmeal  1 slice whole wheat bread toast  1 tablespoon peanut butter without salt  1 banana  1 cup 1% milk  Lunch Tacos made with: 2 corn tortillas   cup black beans, low sodium   cup roasted or grilled chicken (without skin)   avocado  Squeeze of lime juice  1 cup salad greens  1 tablespoon low-sodium salad dressing   cup strawberries  1 orange  Afternoon Snack 1/3 cup grapes  6 ounces yogurt  Evening Meal 3 ounces herb-baked fish  1 baked potato  2 teaspoons olive oil   cup cooked carrots  2 thick slices tomatoes on:  2 lettuce leaves  1 teaspoon olive oil  1 teaspoon balsamic vinegar  1 cup 1% milk  Evening Snack 1  apple   cup almonds without salt   Low-Sodium Vegetarian (Lacto-Ovo) Sample 1-Day Menu  Breakfast 1 cup cooked oatmeal  1 slice whole wheat toast  1 tablespoon peanut butter without salt  1 banana  1 cup 1% milk  Lunch Tacos made with: 2 corn tortillas   cup black beans, low sodium   cup roasted or grilled chicken (without skin)   avocado  Squeeze of lime juice  1 cup salad greens  1 tablespoon low-sodium salad dressing   cup strawberries  1 orange  Evening Meal Stir fry made with:  cup tofu  1 cup brown rice   cup broccoli   cup green beans   cup peppers   tablespoon peanut oil  1 orange  1 cup 1% milk  Evening Snack 4 strips celery  2 tablespoons hummus  1 hard-boiled egg   Low-Sodium Vegan Sample 1-Day Menu  Breakfast 1 cup cooked oatmeal  1 tablespoon peanut butter without salt  1 cup blueberries  1 cup soymilk fortified with calcium, vitamin B12, and vitamin D  Lunch 1 small whole wheat pita   cup cooked lentils  2 tablespoons hummus  4 carrot sticks  1 medium apple  1 cup soymilk fortified with calcium, vitamin B12, and vitamin D  Evening Meal Stir fry made with:  cup tofu  1 cup brown rice   cup broccoli   cup green beans   cup peppers   tablespoon peanut oil  1 cup cantaloupe  Evening Snack 1 cup soy yogurt   cup mixed nuts  Copyright 2020  Academy of [employer] Dietetics. All rights reserved  Sodium Free Flavoring Tips  When cooking, the following items may be used for flavoring instead of salt or seasonings that contain sodium. Remember: A little bit of spice goes a long way! Be careful not to overseason. Spice Blend Recipe (makes about ? cup) 5 teaspoons onion powder  2 teaspoons garlic powder  2 teaspoons paprika  2 teaspoon dry mustard  1 teaspoon crushed thyme leaves   teaspoon white pepper   teaspoon celery seed Food Item Flavorings  Beef Basil, bay leaf, caraway, curry, dill, dry mustard, garlic, grape jelly,  green pepper, mace, marjoram, mushrooms (fresh), nutmeg, onion or onion powder, parsley, pepper, rosemary, sage  Chicken Basil, cloves, cranberries, mace, mushrooms (fresh), nutmeg, oregano, paprika, parsley, pineapple, saffron, sage, savory, tarragon, thyme, tomato, turmeric  Egg Chervil, curry, dill, dry mustard, garlic or garlic powder, green pepper, jelly, mushrooms (fresh), nutmeg, onion powder, paprika, parsley, rosemary, tarragon, tomato  Fish Basil, bay leaf, chervil, curry, dill, dry mustard, green pepper, lemon juice, marjoram, mushrooms (fresh),  paprika, pepper, tarragon, tomato, turmeric  Lamb Cloves, curry, dill, garlic or garlic powder, mace, mint, mint jelly, onion, oregano, parsley, pineapple, rosemary, tarragon, thyme  Pork Applesauce, basil, caraway, chives, cloves, garlic or garlic powder, onion or onion powder, rosemary, thyme  Veal Apricots, basil, bay leaf, currant jelly, curry, ginger, marjoram, mushrooms (fresh), oregano, paprika  Vegetables Basil, dill, garlic or garlic powder, ginger, lemon juice, mace, marjoram, nutmeg, onion or onion powder, tarragon, tomato, sugar or sugar substitute, salt-free salad dressing, vinegar  Desserts Allspice, anise, cinnamon, cloves, ginger, mace, nutmeg, vanilla extract, other extracts   Copyright 2020  Academy of [employer] and Dietetics. All rights reserved  Fluid Restricted Nutrition Therapy  You have been prescribed this diet because your condition affects how much fluid you can eat or drink. If your heart, liver, or kidneys aren't working properly, you may not be able to effectively eliminate fluids from the body and this may cause swelling (edema) in the legs, arms, and/or stomach. Drink no more than _________ liters or ________ ounces or ________cups of fluid per day.  You don't need to stop eating or drinking the same fluids you normally would, but you may need to eat or drink less than usual.  Your registered dietitian nutritionist  will help you determine the correct amount of fluid to consume during the day Breakfast Include fluids taken with medications  Lunch Include fluids taken with medications  Dinner Include fluids taken with medications  Bedtime Snack Include fluids taken with medications     Tips What Are Fluids?  A fluid is anything that is liquid or anything that would melt if left at room temperature. You will need to count these foods and liquids--including any liquid used to take medication--as part of your daily fluid intake. Some examples are: Alcohol  (drink only with your doctor's permission)  Coffee, tea, and other hot beverages  Gelatin (Jell-O)  Gravy  Ice cream, sherbet, sorbet  Ice cubes, ice chips  Milk, liquid creamer  Nutritional supplements  Popsicles  Vegetable and fruit juices; fluid in canned fruit  Watermelon  Yogurt  Soft drinks, lemonade, limeade  Soups  Syrup How Do I Measure My Fluid Intake? Record your fluid intake daily.  Tip: Every day, each time you eat or drink fluids, pour water  in the same amount into an empty container that can hold the same amount of fluids you are allowed daily. This may help you keep track of how much fluid you are taking in throughout the day.  To accurately keep track of how much liquid you take in, measure the size of the cups, glasses, and bowls you use. If you eat soup, measure how much of it is liquid and how much is solid (such as noodles, vegetables, meat). Conversions for Measuring Fluid Intake  Milliliters (mL) Liters (L) Ounces (oz) Cups (c)  1000 1 32 4  1200 1.2 40 5  1500 1.5 50 6 1/4  1800 1.8 60 7 1/2  2000 2 67 8 1/3  Tips to Reduce Your Thirst Chew gum or suck on hard candy.  Rinse or gargle with mouthwash. Do not swallow.  Ice chips or popsicles my help quench thirst, but this too needs to be calculated into the total restriction. Melt ice chips or cubes first to figure out how much fluid they produce (for example,  experiment with melting  cup ice chips or 2 ice cubes).  Add a lemon wedge to your water .  Limit how much salt you take  in. A high salt intake might make you thirstier.  Don't eat or drink all your allowed liquids at once. Space your liquids out through the day.  Use small glasses and cups and sip slowly. If allowed, take your medications with fluids you eat or drink during a meal.   Fluid-Restricted Nutrition Therapy Sample 1-Day Menu  Breakfast 1 slice wheat toast  1 tablespoon peanut butter  1/2 cup yogurt (120 milliliters)  1/2 cup blueberries  1 cup milk (240 milliliters)   Lunch 3 ounces sliced turkey  2 slices whole wheat bread  1/2 cup lettuce for sandwich  2 slices tomato for sandwich  1 ounce reduced-fat, reduced-sodium cheese  1/2 cup fresh carrot sticks  1 banana  1 cup unsweetened tea (240 milliliters)   Evening Meal 8 ounces soup (240 milliliters)  3 ounces salmon  1/2 cup quinoa  1 cup green beans  1 cup mixed greens salad  1 tablespoon olive oil  1 cup coffee (240 milliliters)  Evening Snack 1/2 cup sliced peaches  1/2 cup frozen yogurt (120 milliliters)  1 cup water  (240 milliliters)  Copyright 2020  Academy of Nutrition and Dietetics. All rights reserved    Patient has upcoming appointment for PCP:  [facility]  Phone: [phone] January 13, 2025 at 10:00am

## 2024-10-21 NOTE — Assessment & Plan Note (Addendum)
 Pulse ox dropped to 82% with ambulation yesterday.  Awaiting pulse ox from today.  The patient is homeless will need to be able to come off oxygen prior to disposition.  Continue to try to see if we can get off oxygen on a daily basis.  VBG shows a pCO2 of 81.  Patient unable to tolerate BiPAP.  The patient likely has acute on chronic respiratory failure.  - Continue to require oxygen, need oxygen for home and he is currently homeless, cannot go to a shelter with oxygen.

## 2024-10-21 NOTE — Consult Note (Signed)
 [facility] Psychiatric Consult Initial  Patient Name: .Corey House.  MRN: [id number]  DOB: April 14, 1966  Consult Order details:  Orders (From admission, onward)     Start     Ordered   10/21/24 1005  IP CONSULT TO PSYCHIATRY       Comments: Dr Josette savoy  Ordering Provider: Josette Ade, MD  Provider:  Donnelly Mellow, MD  Question Answer Comment  Location [facility]   Reason for Consult? lost will to live      10/21/24 1005             Mode of Visit: In person    Psychiatry Consult Evaluation  Service Date: October 21, 2024 LOS:  LOS: 1 day  Chief Complaint I've got a lot going on  Primary Psychiatric Diagnoses  Hx of depression   Assessment  Corey House. is a 58 y.o. male admitted: Medically  Psychiatry was consulted by medical team due to concerns about suicidal ideation.  On assessment today, patient denied any suicidal or homicidal ideations.  He denied any auditory or visual hallucinations as well.  He did report being depressed for a long time.  He reported previous inpatient psychiatric admissions, which he felt like did not help him.  He reported the environment being very boring.  On current presentation there was no evidence of psychosis or mania and patient did not appear to be responding to internal stimuli. At this time, patient does not appear to be a risk to self or others.While future psychiatric events cannot be accurately predicted, the patient does not currently require acute inpatient psychiatric care and does not currently meet [place]  involuntary commitment criteria. He adamantly denied suicidal ideation, intent or plan.  He did give consent to call his son for collateral information, but the number that patient provided was not the correct number.  We attempted to call his daughter who is listed in his emergency contacts, no answer at this time.  Patient did report if he received assistance with  applying for disability and other social resources, that this would help his situation tremendously.  He reported much of his depression is due to financial strains and current physical health.  Per chart review it appears that financial assistance team is involved with patient currently and working closely with patient to help address these needs.  At this time, patient does not currently meet criteria for IVC and declined voluntary inpatient psychiatric admission.  Patient did give consent to restart his home Lexapro  and Seroquel  while he is admitted inpatient.  We will start these medications while patient is medically hospitalized. Diagnoses:  Active Hospital problems: Principal Problem:   Acute hypoxic respiratory failure (HCC) Active Problems:   COPD with acute exacerbation (HCC)   Tobacco use disorder   Acute on chronic combined systolic and diastolic CHF (congestive heart failure) (HCC)    Plan   ## Psychiatric Medication Recommendations:  Restarted 10 mg lexapro  daily and 25 mg seroquel  nightly (patient's previous home medications)  ## Medical Decision Making Capacity: Not specifically addressed in this encounter  ## Further Work-up:   -- most recent EKG on 10/20/2024 had QtC of 506 -- Pertinent labwork reviewed earlier this admission includes: cbc, cmp    ## Disposition:-- There are no psychiatric contraindications to discharge at this time  ## Behavioral / Environmental: - No specific recommendations at this time.     ## Safety and Observation Level:  - Based on my clinical  evaluation, I estimate the patient to be at low risk of self harm in the current setting. - At this time, we recommend  routine. This decision is based on my review of the chart including patient's history and current presentation, interview of the patient, mental status examination, and consideration of suicide risk including evaluating suicidal ideation, plan, intent, suicidal or self-harm behaviors,  risk factors, and protective factors. This judgment is based on our ability to directly address suicide risk, implement suicide prevention strategies, and develop a safety plan while the patient is in the clinical setting. Please contact our team if there is a concern that risk level has changed.  CSSR Risk Category:C-SSRS RISK CATEGORY: No Risk  Suicide Risk Assessment: Patient has following modifiable risk factors for suicide: medication noncompliance and triggering events, which we are addressing by restarting patient's home psychiatric medications while medically hospitalized and providing therapeutic communication for patient to discuss life stressors. Patient has following non-modifiable or demographic risk factors for suicide: male gender and psychiatric hospitalization Patient has the following protective factors against suicide: Access to outpatient mental health care  Thank you for this consult request. Recommendations have been communicated to the primary team.  We will be available for any further needs/changes in patient condition at this time.   Zelda Sharps, NP        History of Present Illness  Relevant Aspects of [facility]   Patient Report:  On assessment today, patient denied any suicidal or homicidal ideations.  He denied any auditory or visual hallucinations as well.  He did report being depressed for a long time.  He reported previous inpatient psychiatric admissions, which he felt like did not help him.  He reported the environment being very boring.  On current presentation there was no evidence of psychosis or mania and patient did not appear to be responding to internal stimuli. At this time, patient does not appear to be a risk to self or others.While future psychiatric events cannot be accurately predicted, the patient does not currently require acute inpatient psychiatric care and does not currently meet [place]  involuntary commitment criteria. He  adamantly denied suicidal ideation, intent or plan.  He did give consent to call his son for collateral information, but the number that patient provided was not the correct number.  We attempted to call his daughter who is listed in his emergency contacts, no answer at this time.  Patient did report if he received assistance with applying for disability and other social resources, that this would help his situation tremendously.  He reported much of his depression is due to financial strains and current physical health.  Per chart review it appears that financial assistance team is involved with patient currently and working closely with patient to help address these needs.  At this time, patient does not currently meet criteria for IVC and declined voluntary inpatient psychiatric admission.  Patient did give consent to restart his home Lexapro  and Seroquel  while he is admitted inpatient.  We will start these medications while patient is medically hospitalized.  As stated above, patient reported if he could get assistance with application for disability that this would help his situation tremendously.  He reported due to his medical conditions, he is unable to maintain job.  Patient was noted to be on nasal cannula during this assessment.  Patient denied any access to weapons.  Patient did endorse current occasional alcohol use.  He denied any illicit drug use.  He did report he  smokes cigarettes daily, he denied desire to quit at this time.  Psych ROS:  Depression: Endorsed Anxiety: Denied Mania (lifetime and current): Denied Psychosis: (lifetime and current): Denied  Collateral information:  Unable to reach anyone at numbers provided by patient    Psychiatric and Social History  Psychiatric History:  Information collected from patient and chart review  Prev Dx/Sx: Major depressive disorder Current Psych Provider: Reported unable to make it to follow-up appointments with [facility] due to financial  strains Home Meds (current): Lexapro  and Seroquel  Previous Med Trials: Reported trying many previous medications with no success Therapy: See above under current psych provider  Prior Psych Hospitalization: Yes Prior Self Harm: Reported history of suicidal thoughts, but denied any previous suicide attempts or self-harm Prior Violence: Denied  Family Psych History: Unknown Family Hx suicide: Unknown  Social History:   Educational Hx: Unknown Occupational Hx: Reported inability to work related to his chronic medical conditions-not currently receiving disability checks Legal Hx: Denied Living Situation: Reported living placed a place wherever I can.  Reported being homeless Spiritual Hx: Unknown Access to weapons/lethal means: Denied  Substance History Alcohol: Reported occasional use History of alcohol withdrawal seizures denied History of DT's denied Tobacco: Daily cigarette use Illicit drugs: Denied Prescription drug abuse: Denied Rehab hx: Denied  Exam Findings  Physical Exam: Deferred to hospitalist-note reviewed Vital Signs:  Temp:  [98.2 F (36.8 C)-98.6 F (37 C)] 98.6 F (37 C) (11/05 0815) Pulse Rate:  [65-88] 84 (11/05 0815) Resp:  [10-25] 17 (11/05 0815) BP: (112-131)/(79-95) 112/79 (11/05 0815) SpO2:  [90 %-97 %] 92 % (11/05 0815) Weight:  [53.7 kg] 53.7 kg (11/05 0500) Blood pressure 112/79, pulse 84, temperature 98.6 F (37 C), temperature source Oral, resp. rate 17, height 5' 4 (1.626 m), weight 53.7 kg, SpO2 92%. Body mass index is 20.32 kg/m.    Mental Status Exam: General Appearance: Casual  Orientation:  Full (Time, Place, and Person)  Memory:  Immediate;   Good Recent;   Good Remote;   Good  Concentration:  Concentration: Good  Recall:  Fair  Attention  Fair  Eye Contact:  Good  Speech:  Clear and Coherent  Language:  Good  Volume:  Normal  Mood: Tired  Affect:  Appropriate and Congruent  Thought Process:  Coherent, Goal Directed,  and Linear  Thought Content:  Logical  Suicidal Thoughts:  No  Homicidal Thoughts:  No  Judgement:  Fair  Insight:  Fair and Present  Psychomotor Activity:  Normal  Akathisia:  No  Fund of Knowledge:  Fair      Assets:  Communication Skills Desire for Improvement  Cognition:  WNL  ADL's:  Intact  AIMS (if indicated):        Other History   These have been pulled in through the EMR, reviewed, and updated if appropriate.  Family History:  The patient's family history is not on file.  Medical History: Past Medical History:  Diagnosis Date   Chronic cough    Urolithiasis    Urolithiasis     Surgical History: Past Surgical History:  Procedure Laterality Date   CATARACT EXTRACTION W/PHACO Left 08/09/2021   Procedure: CATARACT EXTRACTION PHACO AND INTRAOCULAR LENS PLACEMENT (IOC) LEFT .051 00:10.0;  Surgeon: Mittie Gaskin, MD;  Location: [facility];  Service: Ophthalmology;  Laterality: Left;   CATARACT EXTRACTION W/PHACO Right 10/04/2021   Procedure: CATARACT EXTRACTION PHACO AND INTRAOCULAR LENS PLACEMENT (IOC) RIGHT 0.67 00:13.8;  Surgeon: Mittie Gaskin, MD;  Location: [facility];  Service: Ophthalmology;  Laterality: Right;  general anesthesia needs to be last case   HERNIA REPAIR     x2   MANDIBLE FRACTURE SURGERY       Medications:   Current Facility-Administered Medications:    albuterol  (PROVENTIL ) (2.5 MG/3ML) 0.083% nebulizer solution 2.5 mg, 2.5 mg, Nebulization, Q2H PRN, Foust, Katy L, NP   artificial tears ophthalmic solution 1 drop, 1 drop, Both Eyes, PRN, Foust, Katy L, NP   enoxaparin  (LOVENOX ) injection 40 mg, 40 mg, Subcutaneous, Q24H, Foust, Katy L, NP, 40 mg at 10/20/24 1751   feeding supplement (ENSURE PLUS HIGH PROTEIN) liquid 237 mL, 237 mL, Oral, BID BM, Wieting, Richard, MD   furosemide (LASIX) injection 40 mg, 40 mg, Intravenous, BID, Foust, Katy L, NP, 40 mg at 10/21/24 9041   guaiFENesin  (MUCINEX ) 12 hr tablet  600 mg, 600 mg, Oral, BID, Foust, Katy L, NP, 600 mg at 10/21/24 0959   ipratropium-albuterol  (DUONEB) 0.5-2.5 (3) MG/3ML nebulizer solution 3 mL, 3 mL, Nebulization, BID, Alexander, Natalie, DO   melatonin tablet 5 mg, 5 mg, Oral, QHS PRN, Foust, Katy L, NP   methylPREDNISolone  sodium succinate (SOLU-MEDROL ) 40 mg/mL injection 40 mg, 40 mg, Intravenous, Q12H, Foust, Katy L, NP, 40 mg at 10/21/24 0959   multivitamin with minerals tablet 1 tablet, 1 tablet, Oral, Daily, Wieting, Richard, MD   nicotine  (NICODERM CQ  - dosed in mg/24 hours) patch 14 mg, 14 mg, Transdermal, Daily, Foust, Katy L, NP, 14 mg at 10/21/24 1002   nicotine  polacrilex (NICORETTE) gum 2 mg, 2 mg, Oral, PRN, Foust, Katy L, NP   pantoprazole (PROTONIX) EC tablet 40 mg, 40 mg, Oral, Daily, Foust, Katy L, NP, 40 mg at 10/21/24 9040  Allergies: No Known Allergies  Zelda Sharps, NP This note was created using Scientist, clinical (histocompatibility and immunogenetics). Please excuse any inadvertent transcription errors. Case was discussed with supervising physician Dr. Jadapalle who is agreeable with current plan.

## 2024-10-21 NOTE — Evaluation (Signed)
 Occupational Therapy Evaluation Patient Details Name: Corey House. MRN: [id number] DOB: 09-Nov-1966 Today's Date: 10/21/2024   History of Present Illness   Pt is a 58 y.o. year old male with past medical history of tobacco use. He presents to [facility] ED with shortness of breath. He has dyspnea at rest and on exertion with a chronic cough that has been present since childhood. MD dx includes: Emphysema and hypoxia.     Clinical Impressions Chart reviewed to date, pt greeted semi supine in bed, agreeable to OT evaluation with encouragement. Pt reports he is MOD I-I in ADL/IADL however is homeless and feels limited in his tolerance for amb and ADLs due to his breathing. Pt presents with deficits in activity tolerance, balance, ?cognition affecting safe and optimal ADL completion. He will benefit from acute OT to further educate/facilitate ADL/functional mobility with energy conservation techniques to improve ease/safety of completion.  Of note: Pt on 2 L via [place] throughout, spo2 to 85% with amb attempts, >90% on 2 L via [place] with seated rest break after approx 30 seconds      If plan is discharge home, recommend the following:   Assist for transportation;A little help with bathing/dressing/bathroom     Functional Status Assessment   Patient has had a recent decline in their functional status and demonstrates the ability to make significant improvements in function in a reasonable and predictable amount of time.     Equipment Recommendations   None recommended by OT     Recommendations for Other Services         Precautions/Restrictions   Precautions Precautions: Fall Recall of Precautions/Restrictions: Intact Restrictions Weight Bearing Restrictions Per Provider Order: No     Mobility Bed Mobility Overal bed mobility: Independent                  Transfers Overall transfer level: Modified independent                         Balance Overall balance assessment: Needs assistance Sitting-balance support: Feet supported Sitting balance-Leahy Scale: Normal     Standing balance support: No upper extremity supported Standing balance-Leahy Scale: Good                             ADL either performed or assessed with clinical judgement   ADL Overall ADL's : Needs assistance/impaired     Grooming: Supervision/safety;Sitting               Lower Body Dressing: Supervision/safety;Sitting/lateral leans   Toilet Transfer: Supervision/safety;Contact guard assist;Ambulation Toilet Transfer Details (indicate cue type and reason): simulated, one lateral LOB that pt self corrects         Functional mobility during ADLs: Supervision/safety;Contact guard assist (approx 30')       Vision Patient Visual Report: No change from baseline       Perception         Praxis         Pertinent Vitals/Pain Pain Assessment Pain Assessment: No/denies pain     Extremity/Trunk Assessment Upper Extremity Assessment Upper Extremity Assessment: Overall WFL for tasks assessed   Lower Extremity Assessment Lower Extremity Assessment: Overall WFL for tasks assessed       Communication Communication Communication: No apparent difficulties   Cognition Arousal: Alert Behavior During Therapy: WFL for tasks assessed/performed, Anxious Cognition: Cognition impaired  Executive functioning impairment (select all impairments): Problem solving, Reasoning (but improves with education and explanation)                   Following commands: Intact       Cueing  General Comments   Cueing Techniques: Verbal cues  spo2 to 85% on 2 L via [place] after amb attempts, >90% after approx 30 seconds of seated rest break; HR in 90s bpm throughout   Exercises Other Exercises Other Exercises: educated pt re energy conservation techniques for increased ease of ADL completion, role of OT, role of  rehab   Shoulder Instructions      Home Living Family/patient expects to be discharged to:: Shelter/Homeless                                 Additional Comments: Pt has been experiencing homelessness for approx 6 months      Prior Functioning/Environment Prior Level of Function : Independent/Modified Independent;History of Falls (last six months)             Mobility Comments: pt reports he amb with no AD, reports he feels off balance and reports falls ADLs Comments: pt reports MOD I-I with ADL;IADL, reports he sleeps on the ground    OT Problem List: Decreased activity tolerance;Impaired balance (sitting and/or standing);Decreased safety awareness;Cardiopulmonary status limiting activity   OT Treatment/Interventions: Self-care/ADL training;DME and/or AE instruction;Energy conservation;Patient/family education;Therapeutic activities      OT Goals(Current goals can be found in the care plan section)   Acute Rehab OT Goals Patient Stated Goal: improve status OT Goal Formulation: With patient Time For Goal Achievement: 11/04/24 Potential to Achieve Goals: Good ADL Goals Pt Will Perform Grooming: with modified independence;sitting;standing Pt Will Perform Lower Body Dressing: with modified independence;sitting/lateral leans;sit to/from stand Pt Will Transfer to Toilet: with modified independence;ambulating Pt Will Perform Toileting - Clothing Manipulation and hygiene: with modified independence;sitting/lateral leans;sit to/from stand   OT Frequency:  Min 1X/week    Co-evaluation              AM-PAC OT 6 Clicks Daily Activity     Outcome Measure Help from another person eating meals?: None Help from another person taking care of personal grooming?: None Help from another person toileting, which includes using toliet, bedpan, or urinal?: None Help from another person bathing (including washing, rinsing, drying)?: A Little Help from another person to  put on and taking off regular upper body clothing?: None Help from another person to put on and taking off regular lower body clothing?: None 6 Click Score: 23   End of Session    Activity Tolerance: Patient tolerated treatment well Patient left: in bed;with call bell/phone within reach;with bed alarm set  OT Visit Diagnosis: Other abnormalities of gait and mobility (R26.89)                Time: 8868-8855 OT Time Calculation (min): 13 min Charges:  OT General Charges $OT Visit: 1 Visit OT Evaluation $OT Eval Low Complexity: 1 Low  Therisa Sheffield, OTD OTR/L  10/21/24, 1:12 PM

## 2024-10-22 ENCOUNTER — Inpatient Hospital Stay: Payer: MEDICAID

## 2024-10-22 DIAGNOSIS — I5033 Acute on chronic diastolic (congestive) heart failure: Secondary | ICD-10-CM | POA: Diagnosis not present

## 2024-10-22 DIAGNOSIS — F17200 Nicotine dependence, unspecified, uncomplicated: Secondary | ICD-10-CM | POA: Diagnosis not present

## 2024-10-22 DIAGNOSIS — J9601 Acute respiratory failure with hypoxia: Secondary | ICD-10-CM | POA: Diagnosis not present

## 2024-10-22 DIAGNOSIS — J441 Chronic obstructive pulmonary disease with (acute) exacerbation: Secondary | ICD-10-CM | POA: Diagnosis not present

## 2024-10-22 DIAGNOSIS — J9602 Acute respiratory failure with hypercapnia: Secondary | ICD-10-CM

## 2024-10-22 DIAGNOSIS — E43 Unspecified severe protein-calorie malnutrition: Secondary | ICD-10-CM

## 2024-10-22 DIAGNOSIS — F3289 Other specified depressive episodes: Secondary | ICD-10-CM

## 2024-10-22 LAB — BLOOD GAS, VENOUS
Acid-Base Excess: 20.7 mmol/L — ABNORMAL HIGH (ref 0.0–2.0)
Bicarbonate: 50.1 mmol/L — ABNORMAL HIGH (ref 20.0–28.0)
O2 Saturation: 94.5 %
Patient temperature: 37
pCO2, Ven: 79 mmHg (ref 44–60)
pH, Ven: 7.41 (ref 7.25–7.43)
pO2, Ven: 71 mmHg — ABNORMAL HIGH (ref 32–45)

## 2024-10-22 LAB — BASIC METABOLIC PANEL WITH GFR
Anion gap: 10 (ref 5–15)
BUN: 18 mg/dL (ref 6–20)
CO2: 41 mmol/L — ABNORMAL HIGH (ref 22–32)
Calcium: 8.4 mg/dL — ABNORMAL LOW (ref 8.9–10.3)
Chloride: 90 mmol/L — ABNORMAL LOW (ref 98–111)
Creatinine, Ser: 0.78 mg/dL (ref 0.61–1.24)
GFR, Estimated: >60 mL/min (ref >60)
Glucose, Bld: 97 mg/dL (ref 70–99)
Potassium: 3.5 mmol/L (ref 3.5–5.1)
Sodium: 141 mmol/L (ref 135–145)

## 2024-10-22 LAB — C-REACTIVE PROTEIN: CRP: 0.7 mg/dL (ref <1.0)

## 2024-10-22 MED ORDER — ACETAZOLAMIDE SODIUM 500 MG IJ SOLR
500.0000 mg | Freq: Once | INTRAMUSCULAR | Status: AC
  Administered 2024-10-22: 500 mg via INTRAVENOUS
  Filled 2024-10-22: qty 500

## 2024-10-22 MED ORDER — STERILE WATER FOR INJECTION IJ SOLN
INTRAMUSCULAR | Status: AC
  Administered 2024-10-22: 10 mL
  Filled 2024-10-22: qty 10

## 2024-10-22 MED ORDER — IPRATROPIUM-ALBUTEROL 0.5-2.5 (3) MG/3ML IN SOLN: 3.0000 mL | Freq: Three times a day (TID) | RESPIRATORY_TRACT | Status: DC

## 2024-10-22 NOTE — Progress Notes (Signed)
 Mobility Specialist - Progress Note  Pre-mobility: HR-112, SpO2-92%  During mobility: HR-111, SpO2-88% recovered to 90 < 1 min  Post-mobility: HR-110, SPO2-90%   10/22/24 1000  Oxygen Therapy  SpO2 93 %  O2 Device Nasal Cannula  O2 Flow Rate (L/min) 2 L/min  Mobility  Activity Ambulated with assistance;Stood at bedside;Dangled on edge of bed  Level of Assistance Independent after set-up  Assistive Device None  Distance Ambulated (ft) 200 ft  Range of Motion/Exercises All extremities  Activity Response Tolerated well  Mobility visit 1 Mobility  Mobility Specialist Start Time (ACUTE ONLY) [id number]  Mobility Specialist Stop Time (ACUTE ONLY) 1000  Mobility Specialist Time Calculation (min) (ACUTE ONLY) 22 min   Pt was supine in bed with HOB elevated on O@ @ 2 L upon entry. Pt did agree to mobility. Pt is able today to get to the EOB independently with no bed features. Pt is able today to STS independently with no AD. Pt O2 vitals were taken throughout activity as a precaution. Pt ambulated well. Pt did need a recovery break to check vitals. Pt states that he is unaware of O2 drop so recovery break was helpful. Pt recovered in less than 30 sec. After activity pt returned to room supine in bed with needs in reach. Pt is on liquid restrictions.  Clem Rodes Mobility Specialist 10/22/24, 10:44 AM

## 2024-10-22 NOTE — Assessment & Plan Note (Signed)
 Continue supplements

## 2024-10-22 NOTE — Progress Notes (Signed)
 Occupational Therapy Treatment Patient Details Name: Corey House. MRN: [id number] DOB: 05-26-1966 Today's Date: 10/22/2024   History of present illness Pt is a 58 y.o. year old male with past medical history of tobacco use. He presents to [facility] ED with shortness of breath. He has dyspnea at rest and on exertion with a chronic cough that has been present since childhood. MD dx includes: Emphysema and hypoxia.   OT comments  Chart reviewed to date, pt greeted semi supine in bed, agreeable to OT tx session with encouragement. This therapist requested shower orders but pt declines on this date despite encouragement/education. He does perform toileting with MOD I, amb in room with supervision-MOD I. He is provided energy conservation hand out for further review and additional education re: EC techniques. He is encouraged to shower with supervision from staff due to new O2 needs. Pt says maybe tomorrow. Pt is left in bedside chair, all needs met. OT will follow.       If plan is discharge home, recommend the following:  Assist for transportation;A little help with bathing/dressing/bathroom   Equipment Recommendations  None recommended by OT    Recommendations for Other Services      Precautions / Restrictions Precautions Precautions: Fall Recall of Precautions/Restrictions: Intact Restrictions Weight Bearing Restrictions Per Provider Order: No       Mobility Bed Mobility Overal bed mobility: Independent                  Transfers Overall transfer level: Modified independent                       Balance Overall balance assessment: Modified Independent Sitting-balance support: Feet supported Sitting balance-Leahy Scale: Normal     Standing balance support: No upper extremity supported Standing balance-Leahy Scale: Good                             ADL either performed or assessed with clinical judgement   ADL Overall ADL's :  Needs assistance/impaired                         Toilet Transfer: Modified Independent;Supervision/safety   Toileting- Clothing Manipulation and Hygiene: Modified independent;Sit to/from stand Toileting - Clothing Manipulation Details (indicate cue type and reason): urinal     Functional mobility during ADLs: Modified independent;Supervision/safety (approx 30' in room)      Extremity/Trunk Assessment              Vision       Perception     Praxis     Communication Communication Communication: No apparent difficulties   Cognition Arousal: Alert Behavior During Therapy: WFL for tasks assessed/performed, Anxious Cognition: Cognition impaired           Executive functioning impairment (select all impairments): Problem solving                   Following commands: Intact        Cueing   Cueing Techniques: Verbal cues  Exercises Other Exercises Other Exercises: provided energy conservation technique hand out; encouraged pt to take a shower with supervision/assist from this therapist, he declines on this date    Shoulder Instructions       General Comments spo2 89-90% on 2L via [place] after amb in room    Pertinent Vitals/ Pain       Pain Assessment  Pain Assessment: 0-10 Pain Score: 8  Pain Location: headache Pain Intervention(s): Monitored during session, Repositioned  Home Living                                          Prior Functioning/Environment              Frequency  Min 1X/week        Progress Toward Goals  OT Goals(current goals can now be found in the care plan section)  Progress towards OT goals: Progressing toward goals  Acute Rehab OT Goals Time For Goal Achievement: 11/04/24  Plan      Co-evaluation                 AM-PAC OT 6 Clicks Daily Activity     Outcome Measure   Help from another person eating meals?: None Help from another person taking care of personal grooming?:  None Help from another person toileting, which includes using toliet, bedpan, or urinal?: None Help from another person bathing (including washing, rinsing, drying)?: A Little Help from another person to put on and taking off regular upper body clothing?: None Help from another person to put on and taking off regular lower body clothing?: None 6 Click Score: 23    End of Session Equipment Utilized During Treatment: Oxygen  OT Visit Diagnosis: Other abnormalities of gait and mobility (R26.89)   Activity Tolerance Patient tolerated treatment well   Patient Left in chair;with call bell/phone within reach   Nurse Communication Mobility status;Patient requests pain meds        Time: 8572-8557 OT Time Calculation (min): 15 min  Charges: OT General Charges $OT Visit: 1 Visit OT Treatments $Therapeutic Activity: 8-22 mins Therisa Sheffield, OTD OTR/L  10/22/24, 3:35 PM

## 2024-10-22 NOTE — Progress Notes (Signed)
 PULMONOLOGY         Date: 10/22/2024,   MRN# [id number] Corey House. 1966-11-29     AdmissionWeight: 58.8 kg                 CurrentWeight: 54.3 kg  Referring provider: Dr Josette   CHIEF COMPLAINT:   Acute exacerbation of CHF and COPD   HISTORY OF PRESENT ILLNESS   This is a 58 yo M with hx of chronic cough, COPD, CHF, Urolithiasis, tobacco dependence who came in due to worsening SOB.  [name] hypoxemic requiring HHFNC at 15L/min to reach normoxia.  Since admission he has partially improved with weaning down of O2 req.  He had CT chest with PE protocol and was found to have chronic bronchitic COPD and centrilobular emphysema with no venous thromboemoblism.  He also had noted RV dilation suggestive of RV failure.  Cardiac biomarkers were mildy elevated consisted with acute on chronic CHF. He had septic workup performed and had steroids with antibiotics prescribed. He also had DVT studies and this was negative for bilateral LE dvt.  Patient requested pulmonary evaluation and for this reason consultation was placed.   10/22/24- patient is improved, CXR repeating today. Preparing for dc home in AM. Plan to wean steroids and abx to PO regimen on outpatient basis.    PAST MEDICAL HISTORY   Past Medical History:  Diagnosis Date   Chronic cough    Urolithiasis    Urolithiasis      SURGICAL HISTORY   Past Surgical History:  Procedure Laterality Date   CATARACT EXTRACTION W/PHACO Left 08/09/2021   Procedure: CATARACT EXTRACTION PHACO AND INTRAOCULAR LENS PLACEMENT (IOC) LEFT .051 00:10.0;  Surgeon: Mittie Gaskin, MD;  Location: [facility];  Service: Ophthalmology;  Laterality: Left;   CATARACT EXTRACTION W/PHACO Right 10/04/2021   Procedure: CATARACT EXTRACTION PHACO AND INTRAOCULAR LENS PLACEMENT (IOC) RIGHT 0.67 00:13.8;  Surgeon: Mittie Gaskin, MD;  Location: [facility];  Service: Ophthalmology;  Laterality: Right;   general anesthesia needs to be last case   HERNIA REPAIR     x2   MANDIBLE FRACTURE SURGERY       FAMILY HISTORY   History reviewed. No pertinent family history.   SOCIAL HISTORY   Social History   Tobacco Use   Smoking status: Every Day    Current packs/day: 1.00    Average packs/day: 1 pack/day for 35.0 years (35.0 ttl pk-yrs)    Types: Cigarettes   Smokeless tobacco: Never   Tobacco comments:    Started smoking age 50  Vaping Use   Vaping status: Never Used  Substance Use Topics   Alcohol use: No   Drug use: Not Currently     MEDICATIONS    Home Medication:    Current Medication:  Current Facility-Administered Medications:    acetaminophen  (TYLENOL ) tablet 650 mg, 650 mg, Oral, Q6H PRN, Josette Ade, MD, 650 mg at 10/21/24 1158   albuterol  (PROVENTIL ) (2.5 MG/3ML) 0.083% nebulizer solution 2.5 mg, 2.5 mg, Nebulization, Q2H PRN, Foust, Katy L, NP   ALPRAZolam (XANAX) tablet 0.25 mg, 0.25 mg, Oral, TID PRN, Josette, Richard, MD, 0.25 mg at 10/22/24 0604   artificial tears ophthalmic solution 1 drop, 1 drop, Both Eyes, PRN, Foust, Katy L, NP   azithromycin  (ZITHROMAX ) tablet 250 mg, 250 mg, Oral, Daily, Christne Platts, MD, 250 mg at 10/22/24 0829   enoxaparin  (LOVENOX ) injection 40 mg, 40 mg, Subcutaneous, Q24H, Foust, Katy L,  NP, 40 mg at 10/21/24 1832   escitalopram  (LEXAPRO ) tablet 10 mg, 10 mg, Oral, Daily, Smith, Annie B, NP, 10 mg at 10/22/24 9171   feeding supplement (ENSURE PLUS HIGH PROTEIN) liquid 237 mL, 237 mL, Oral, BID BM, Wieting, Richard, MD   guaiFENesin  (MUCINEX ) 12 hr tablet 600 mg, 600 mg, Oral, BID, Foust, Katy L, NP, 600 mg at 10/22/24 0829   melatonin tablet 5 mg, 5 mg, Oral, QHS PRN, Foust, Katy L, NP   methylPREDNISolone  sodium succinate (SOLU-MEDROL ) 40 mg/mL injection 40 mg, 40 mg, Intravenous, Daily, Wieting, Richard, MD, 40 mg at 10/22/24 9167   multivitamin with minerals tablet 1 tablet, 1 tablet, Oral, Daily, Wieting, Richard,  MD, 1 tablet at 10/22/24 9170   nicotine  (NICODERM CQ  - dosed in mg/24 hours) patch 14 mg, 14 mg, Transdermal, Daily, Foust, Katy L, NP, 14 mg at 10/22/24 0830   nicotine  polacrilex (NICORETTE) gum 2 mg, 2 mg, Oral, PRN, Foust, Katy L, NP   pantoprazole (PROTONIX) EC tablet 40 mg, 40 mg, Oral, Daily, Foust, Katy L, NP, 40 mg at 10/22/24 9170   QUEtiapine  (SEROQUEL ) tablet 25 mg, 25 mg, Oral, QHS, Smith, Annie B, NP, 25 mg at 10/21/24 2200    ALLERGIES   Patient has no known allergies.     REVIEW OF SYSTEMS    Review of Systems:  Gen:  Denies  fever, sweats, chills weigh loss  HEENT: Denies blurred vision, double vision, ear pain, eye pain, hearing loss, nose bleeds, sore throat Cardiac:  No dizziness, chest pain or heaviness, chest tightness,edema Resp:   reports dyspnea chronically  Gi: Denies swallowing difficulty, stomach pain, nausea or vomiting, diarrhea, constipation, bowel incontinence Gu:  Denies bladder incontinence, burning urine Ext:   Denies Joint pain, stiffness or swelling Skin: Denies  skin rash, easy bruising or bleeding or hives Endoc:  Denies polyuria, polydipsia , polyphagia or weight change Psych:   Denies depression, insomnia or hallucinations   Other:  All other systems negative   VS: BP 111/78 (BP Location: Left Arm)   Pulse 80   Temp 98.6 F (37 C) (Oral)   Resp 18   Ht 5' 4 (1.626 m)   Wt 54.3 kg   SpO2 93%   BMI 20.55 kg/m      PHYSICAL EXAM    GENERAL:NAD, no fevers, chills, no weakness no fatigue HEAD: Normocephalic, atraumatic.  EYES: Pupils equal, round, reactive to light. Extraocular muscles intact. No scleral icterus.  MOUTH: Moist mucosal membrane. Dentition intact. No abscess noted.  EAR, NOSE, THROAT: Clear without exudates. No external lesions.  NECK: Supple. No thyromegaly. No nodules. No JVD.  PULMONARY: decreased breath sounds with mild rhonchi worse at bases bilaterally.  CARDIOVASCULAR: S1 and S2. Regular rate and  rhythm. No murmurs, rubs, or gallops. No edema. Pedal pulses 2+ bilaterally.  GASTROINTESTINAL: Soft, nontender, nondistended. No masses. Positive bowel sounds. No hepatosplenomegaly.  MUSCULOSKELETAL: No swelling, clubbing, or edema. Range of motion full in all extremities.  NEUROLOGIC: Cranial nerves II through XII are intact. No gross focal neurological deficits. Sensation intact. Reflexes intact.  SKIN: No ulceration, lesions, rashes, or cyanosis. Skin warm and dry. Turgor intact.  PSYCHIATRIC: Mood, affect within normal limits. The patient is awake, alert and oriented x 3. Insight, judgment intact.       IMAGING     rative & Impression  EXAM: CTA of the Chest with contrast for PE 10/20/2024 02:19:01 PM   TECHNIQUE: CTA of the chest was performed without  and with the administration of 75 mL of iohexol  (OMNIPAQUE ) 350 MG/ML injection. Multiplanar reformatted images are provided for review. MIP images are provided for review. Automated exposure control, iterative reconstruction, and/or weight based adjustment of the mA/kV was utilized to reduce the radiation dose to as low as reasonably achievable.   COMPARISON: Same day x-ray and CTA chest 01/04/2023.   CLINICAL HISTORY: Pulmonary embolism (PE) suspected, high prob.   FINDINGS:   PULMONARY ARTERIES: Pulmonary arteries are adequately opacified for evaluation. Negative for pulmonary embolism. Main pulmonary artery is normal in caliber.   MEDIASTINUM: Dilated right ventricle with reflux of contrast into the IVC and hepatic veins compatible with elevated right heart pressures. There is no acute abnormality of the thoracic aorta.   LYMPH NODES: No mediastinal, hilar or axillary lymphadenopathy.   LUNGS AND PLEURA: Diffuse bronchial wall thickening. Mucous plugging in the lower lobes. Emphysema. No focal consolidation or pulmonary edema. No pleural effusion or pneumothorax.   UPPER ABDOMEN: Limited images of the upper  abdomen are unremarkable.   SOFT TISSUES AND BONES: No acute bone or soft tissue abnormality.   IMPRESSION: 1. No pulmonary embolism. 2. Dilated right ventricle with reflux of contrast into the IVC and hepatic veins, compatible with elevated right heart pressures. 3. Chronic bronchitis. 4. Emphysema. Given patient age, consider evaluation for low-dose CT lung cancer screening.   Electronically signed by: Norman Gatlin MD 10/20/2024 02:49 PM EST RP Workstation: [id number]      ASSESSMENT/PLAN     Acute exacerbation of COPD   - continue nebulizer therapy Duoneb 16h prn   - s/p solumedrol 125x1 followed by 40mg  IV daily -wean to pred  -adding zithromax  PO - PT/OT -Incentive spirometry and flutter valve    Mucus plugging of tracheobronchial tree    - considering outpatient bronchoscopy     -mucomyst 4ml 20% BID while hospitalized   Tobacco dependence   - 14mg  transdermal patch     Enlarged Right ventricle Suggestive of pulmonary hypertension - would recommend RHC via cardiology service when possible non urgently    Psychiatric history   Patient is currently being evaluated by psychiatrist                Thank you for allowing me to participate in the care of this patient.   Patient/Family are satisfied with care plan and all questions have been answered.    Provider disclosure: Patient with at least one acute or chronic illness or injury that poses a threat to life or bodily function and is being managed actively during this encounter.  All of the below services have been performed independently by signing provider:  review of prior documentation from internal and or external health records.  Review of previous and current lab results.  Interview and comprehensive assessment during patient visit today. Review of current and previous chest radiographs/CT scans. Discussion of management and test interpretation with health care team and patient/family.    This document was prepared using Dragon voice recognition software and may include unintentional dictation errors.     Amalia Edgecombe, M.D.  Division of Pulmonary & Critical Care Medicine

## 2024-10-22 NOTE — Progress Notes (Signed)
 PT Cancellation Note  Patient Details Name: Corey House. MRN: [id number] DOB: 05/14/1966   Cancelled Treatment:    Reason Eval/Treat Not Completed: Other (comment) Pt laying in bed on 2L O2 via [place], SpO2 in the mid 90s. He reports he had worked with OT this afternoon and walked earlier (entire loop on 2L) with mobility techs.  He states he is too low energy, tired and weak this afternoon to do more.  MD notified - will maintain on caseload and attempt to see as able.  Carmin JONELLE Deed, DPT 10/22/2024, 3:58 PM

## 2024-10-22 NOTE — Assessment & Plan Note (Signed)
 Patient on Lexapro  and Seroquel  at night

## 2024-10-22 NOTE — Plan of Care (Signed)

## 2024-10-22 NOTE — Progress Notes (Signed)
 This RN went to ask pt twice for ambulatory oxygen test . Everytime pt states he is not ready yet.  MD made aware about it

## 2024-10-22 NOTE — Progress Notes (Signed)
 Progress Note   Patient: Corey House. FMW:[id number] DOB: May 31, 1966 DOA: 10/20/2024     2 DOS: the patient was seen and examined on 10/22/2024   Brief hospital course: 58 y.o. year old male with past medical history of  tobacco use disorder reporting a 41 year pack per year history, smoking ~1 pack per day since he was 58 years old. He denies any additional medical history though he has not seen a medical provider in ~20 years. He presents to [facility] ED with shortness of breath that began ~ 9 months ago and worsened ~3 months ago. He reports today his dyspnea is not significantly different than it was 3 months ago. He endorses a chronic cough that has been present since childhood. Her ports the cough is currently, minimally productive with white phlegm. Additionally, he endorses bilateral lower extremity swelling that began in the last 2 days, per his report (R) > (L). On exam the swelling is equal bilaterally. He endorses fatigue and dyspnea that is worse with position changes such as bending over and when standing from lying. He denies chest pain, palpitations, or fever.   ED Course: On arrival to [facility] ED patient was noted to be afebrile temp 36.8C, BP 142/111, HR 88, RR 22, SPO2 100% on 15L non-rebreather. Patient now on 2L via nasal cannula with SPO2 96%. CXR obtained and shows non specific findings of increased interstitial markings. CTA PE obtained and shows no PE, dilated RV and reflux of contract into the IVC and hepatic vein consistent with (R) heart failure, chronic bronchitis, and emphysema. US  DVT (B)LE negative for DVT. Labs notable Negative COVID, flu, RSV,for BNP 284.2, BUN 24, troponin 41--> 38 with no report of chest pain.  Lactic acid 0.9.  Blood cultures drawn and in process.  He was given Lasix, DuoNebs, and Solu-Medrol .  TRH contacted for admission.  11/5.  Pulse ox with ambulation down to 85%.  Patient admitted with COPD and CHF exacerbation.  Patient  also states that he is short of breath and he is waiting to die.  Appreciate psychiatric consultation.  Patient is homeless. 11/6.  Patient states that he has been sleepy.  Venous blood gas shows a pCO2 of 79 with a pH of 7.41.  Will start BiPAP at night.  Try to see if we can taper off oxygen.  Assessment and Plan: * Acute respiratory failure with hypoxia and hypercapnia (HCC) Pulse ox dropped to 85% yesterday.  The patient is homeless will need to be able to come off oxygen prior to disposition.  Continue to try to see if we can get off oxygen on a daily basis.  VBG shows a pCO2 of 79.  Will put on BiPAP at night.  COPD with acute exacerbation (HCC) Patient on Zithromax , Solu-Medrol  and nebulizer treatments.  Acute on chronic diastolic CHF (congestive heart failure) (HCC) EF 55 to 60%.  Hold Lasix with elevated pCO2 on chemistry.  Give a dose of acetazolamide.  CT scan commenting on elevated right heart pressures.  Tobacco use disorder Must stop smoking.  Headache Tylenol  ordered.  IV magnesium  given on 11/5.  Increased anion gap metabolic acidosis Resolved  Hypernatremia Resolved  Depression Patient on Lexapro  and Seroquel  at night  Protein-calorie malnutrition, severe Continue supplements        Subjective: Patient states he is still short of breath.  I did see him walk around the hallway with staff.  With CO2 rising on chemistry will hold Lasix and give  a dose of acetazolamide today.  pCO2 elevated on venous blood gas will do BiPAP at night.  Admitted with COPD and CHF exacerbation.  Physical Exam: Vitals:   10/22/24 0532 10/22/24 0717 10/22/24 0820 10/22/24 1000  BP: 117/82  111/78   Pulse: 71  80   Resp: 18  18   Temp: 97.8 F (36.6 C)  98.6 F (37 C)   TempSrc:   Oral   SpO2: 93% 94% 93% 93%  Weight:      Height:       Physical Exam HENT:     Head: Normocephalic.     Mouth/Throat:     Pharynx: No oropharyngeal exudate.  Eyes:     General: Lids are  normal.     Conjunctiva/sclera: Conjunctivae normal.  Cardiovascular:     Rate and Rhythm: Normal rate and regular rhythm.     Heart sounds: Normal heart sounds, S1 normal and S2 normal.  Pulmonary:     Breath sounds: Examination of the right-lower field reveals decreased breath sounds. Examination of the left-lower field reveals decreased breath sounds. Decreased breath sounds present. No wheezing, rhonchi or rales.  Abdominal:     Palpations: Abdomen is soft.     Tenderness: There is no abdominal tenderness.  Musculoskeletal:     Right lower leg: Swelling present.     Left lower leg: Swelling present.  Skin:    General: Skin is warm.     Findings: No rash.  Neurological:     Mental Status: He is alert and oriented to person, place, and time.     Data Reviewed: Venous blood gas showing a pH of 7.41, pCO2 of 79, creatinine 0.78, sodium 141, chloride 98, CO2 on chemistry 41  Family Communication: Declined  Disposition: Status is: Inpatient Remains inpatient appropriate because: Will need to be able to get off oxygen prior to disposition.  With pCO2 elevation will give BiPAP tonight.  Hold Lasix with elevated CO2 on chemistry.  Give a dose of acetazolamide today.  Planned Discharge Destination: Transitional care team working with [facility] health about living options.  He will need a place to be especially if I can get him off oxygen.    Time spent: 32 minutes  Author: Charlie Patterson, MD 10/22/2024 2:34 PM  For on call review [email or web address].

## 2024-10-23 DIAGNOSIS — J441 Chronic obstructive pulmonary disease with (acute) exacerbation: Secondary | ICD-10-CM | POA: Diagnosis not present

## 2024-10-23 DIAGNOSIS — F17200 Nicotine dependence, unspecified, uncomplicated: Secondary | ICD-10-CM | POA: Diagnosis not present

## 2024-10-23 DIAGNOSIS — E872 Acidosis, unspecified: Secondary | ICD-10-CM

## 2024-10-23 DIAGNOSIS — J9601 Acute respiratory failure with hypoxia: Secondary | ICD-10-CM | POA: Diagnosis not present

## 2024-10-23 DIAGNOSIS — F329 Major depressive disorder, single episode, unspecified: Secondary | ICD-10-CM

## 2024-10-23 DIAGNOSIS — G4489 Other headache syndrome: Secondary | ICD-10-CM

## 2024-10-23 DIAGNOSIS — I5033 Acute on chronic diastolic (congestive) heart failure: Secondary | ICD-10-CM | POA: Diagnosis not present

## 2024-10-23 LAB — BASIC METABOLIC PANEL WITH GFR
Anion gap: 9 (ref 5–15)
BUN: 24 mg/dL — ABNORMAL HIGH (ref 6–20)
CO2: 32 mmol/L (ref 22–32)
Calcium: 8.9 mg/dL (ref 8.9–10.3)
Chloride: 99 mmol/L (ref 98–111)
Creatinine, Ser: 0.8 mg/dL (ref 0.61–1.24)
GFR, Estimated: >60 mL/min (ref >60)
Glucose, Bld: 94 mg/dL (ref 70–99)
Potassium: 4 mmol/L (ref 3.5–5.1)
Sodium: 140 mmol/L (ref 135–145)

## 2024-10-23 LAB — BLOOD GAS, VENOUS
Acid-Base Excess: 10 mmol/L — ABNORMAL HIGH (ref 0.0–2.0)
Bicarbonate: 39.9 mmol/L — ABNORMAL HIGH (ref 20.0–28.0)
O2 Saturation: 74.7 %
Patient temperature: 37
pCO2, Ven: 81 mmHg (ref 44–60)
pH, Ven: 7.3 (ref 7.25–7.43)
pO2, Ven: 45 mmHg (ref 32–45)

## 2024-10-23 LAB — C-REACTIVE PROTEIN: CRP: 0.8 mg/dL (ref <1.0)

## 2024-10-23 MED ORDER — PREDNISONE 50 MG PO TABS
45.0000 mg | ORAL_TABLET | Freq: Every day | ORAL | Status: AC
  Administered 2024-10-25: 45 mg via ORAL
  Filled 2024-10-23: qty 1

## 2024-10-23 MED ORDER — PREDNISONE 10 MG PO TABS
5.0000 mg | ORAL_TABLET | Freq: Every day | ORAL | Status: AC
  Administered 2024-11-02: 5 mg via ORAL
  Filled 2024-10-23: qty 1

## 2024-10-23 MED ORDER — PREDNISONE 20 MG PO TABS
20.0000 mg | ORAL_TABLET | Freq: Every day | ORAL | Status: AC
  Administered 2024-10-30: 20 mg via ORAL
  Filled 2024-10-23: qty 1

## 2024-10-23 MED ORDER — PREDNISONE 50 MG PO TABS
35.0000 mg | ORAL_TABLET | Freq: Every day | ORAL | Status: AC
  Administered 2024-10-27: 35 mg via ORAL
  Filled 2024-10-23: qty 1

## 2024-10-23 MED ORDER — PREDNISONE 20 MG PO TABS
40.0000 mg | ORAL_TABLET | Freq: Every day | ORAL | Status: AC
  Administered 2024-10-26: 40 mg via ORAL
  Filled 2024-10-23: qty 2

## 2024-10-23 MED ORDER — FUROSEMIDE 20 MG PO TABS
20.0000 mg | ORAL_TABLET | Freq: Every day | ORAL | Status: DC
  Administered 2024-10-23 – 2024-10-25 (×3): 20 mg via ORAL
  Filled 2024-10-23 (×3): qty 1

## 2024-10-23 MED ORDER — PREDNISONE 50 MG PO TABS: 50.0000 mg | ORAL_TABLET | Freq: Every day | ORAL | Status: DC

## 2024-10-23 MED ORDER — PREDNISONE 50 MG PO TABS
25.0000 mg | ORAL_TABLET | Freq: Every day | ORAL | Status: AC
  Administered 2024-10-29: 25 mg via ORAL
  Filled 2024-10-23: qty 1

## 2024-10-23 MED ORDER — PREDNISONE 10 MG PO TABS
10.0000 mg | ORAL_TABLET | Freq: Every day | ORAL | Status: AC
  Administered 2024-11-01: 10 mg via ORAL
  Filled 2024-10-23: qty 1

## 2024-10-23 MED ORDER — ACETYLCYSTEINE 20 % IN SOLN
3.0000 mL | Freq: Two times a day (BID) | RESPIRATORY_TRACT | Status: DC
  Administered 2024-10-23: 3 mL via RESPIRATORY_TRACT
  Filled 2024-10-23: qty 4

## 2024-10-23 MED ORDER — PREDNISONE 10 MG PO TABS
15.0000 mg | ORAL_TABLET | Freq: Every day | ORAL | Status: AC
  Administered 2024-10-31: 15 mg via ORAL
  Filled 2024-10-23: qty 2

## 2024-10-23 MED ORDER — PREDNISONE 50 MG PO TABS
50.0000 mg | ORAL_TABLET | Freq: Every day | ORAL | Status: AC
  Administered 2024-10-24: 50 mg via ORAL
  Filled 2024-10-23: qty 1

## 2024-10-23 MED ORDER — PREDNISONE 20 MG PO TABS
30.0000 mg | ORAL_TABLET | Freq: Every day | ORAL | Status: AC
  Administered 2024-10-28: 30 mg via ORAL
  Filled 2024-10-23: qty 1

## 2024-10-23 NOTE — Progress Notes (Addendum)
 Pt stated he did not want to wear BIPAP tonight, let pt know that if he changed his mind Respiratory could bring one to him. RN aware.

## 2024-10-23 NOTE — Progress Notes (Signed)
 Attempted to try patient on nighttime Bipap. Placed patient on bipap unit for about 3 mins and then patient began having a panic attack and demanded that it be removed. He stated that he cannot tolerate Bipap/Cpap. Patient returned to 2L nasal cannula and is resting comfortably. RN informed.

## 2024-10-23 NOTE — Progress Notes (Signed)
 Mobility Specialist - Progress Note   Pre-mobility: HR-88,  SpO2-91%  During mobility: HR-116,  SpO2-85% recovered to 90% in less than 30 sec  Post-mobility: HR-98, SPO2-92%   10/23/24 0900  Mobility  Activity Ambulated with assistance;Stood at bedside  Level of Assistance Independent after set-up  Assistive Device None  Distance Ambulated (ft) 360 ft  Range of Motion/Exercises All extremities  Activity Response Tolerated well  Mobility visit 1 Mobility  Mobility Specialist Start Time (ACUTE ONLY) [id number]  Mobility Specialist Stop Time (ACUTE ONLY) [id number]  Mobility Specialist Time Calculation (min) (ACUTE ONLY) 18 min   Pt was supine in bed on O2 @ 2 L upon entry. Pt agreed to mobility. Pt was able today to get to the EOB independently with no bed features. Pt was able to STS independently with no AD. O2 vitals were taken throughout activity as a precaution. Pt once up had a BM in the bathroom. Afterwards pt ambulated well. Pt did need a recovery break to check vitals. After activity pt returned to the room in bed with needs in reach. O2 remained above 90%.  Clem Rodes Mobility Specialist 10/23/24, 9:52 AM

## 2024-10-23 NOTE — Progress Notes (Signed)
 Confirmed with Annabella Gin, financial navigator, that patient's SSI process has been started. Tiffany also stated referral to [facility] had been started, as well, and they should be reaching out to patient.

## 2024-10-23 NOTE — Progress Notes (Signed)
 Progress Note   Patient: Corey House. FMW:[id number] DOB: [birth year] DOA: 10/20/2024     3 DOS: the patient was seen and examined on 10/23/2024   Brief hospital course: 58 y.o. year old male with past medical history of  tobacco use disorder reporting a 41 year pack per year history, smoking ~1 pack per day since he was 58 years old. He denies any additional medical history though he has not seen a medical provider in ~20 years. He presents to [facility] ED with shortness of breath that began ~ 9 months ago and worsened ~3 months ago. He reports today his dyspnea is not significantly different than it was 3 months ago. He endorses a chronic cough that has been present since childhood. Her ports the cough is currently, minimally productive with white phlegm. Additionally, he endorses bilateral lower extremity swelling that began in the last 2 days, per his report (R) > (L). On exam the swelling is equal bilaterally. He endorses fatigue and dyspnea that is worse with position changes such as bending over and when standing from lying. He denies chest pain, palpitations, or fever.   ED Course: On arrival to [facility] ED patient was noted to be afebrile temp 36.8C, BP 142/111, HR 88, RR 22, SPO2 100% on 15L non-rebreather. Patient now on 2L via nasal cannula with SPO2 96%. CXR obtained and shows non specific findings of increased interstitial markings. CTA PE obtained and shows no PE, dilated RV and reflux of contract into the IVC and hepatic vein consistent with (R) heart failure, chronic bronchitis, and emphysema. US  DVT (B)LE negative for DVT. Labs notable Negative COVID, flu, RSV,for BNP 284.2, BUN 24, troponin 41--> 38 with no report of chest pain.  Lactic acid 0.9.  Blood cultures drawn and in process.  He was given Lasix, DuoNebs, and Solu-Medrol .  TRH contacted for admission.  11/5.  Pulse ox with ambulation down to 85%.  Patient admitted with COPD and CHF exacerbation.  Patient  also states that he is short of breath and he is waiting to die.  Appreciate psychiatric consultation.  Patient is homeless. 11/6.  Patient states that he has been sleepy.  Venous blood gas shows a pCO2 of 79 with a pH of 7.41.  Will start BiPAP at night.  Try to see if we can taper off oxygen. 11/7.  Patient hypoxic with walking around with pulmonary to 87%.  Assessment and Plan: * Acute respiratory failure with hypoxia and hypercapnia (HCC) Pulse ox dropped to 87% with ambulation today.  The patient is homeless will need to be able to come off oxygen prior to disposition.  Continue to try to see if we can get off oxygen on a daily basis.  VBG shows a pCO2 of 81.  I recommended BiPAP at night  COPD with acute exacerbation (HCC) Patient on Zithromax , Solu-Medrol  and nebulizer treatments.  Acute on chronic diastolic CHF (congestive heart failure) (HCC) EF 55 to 60%.  Initially on IV Lasix.  On 11/6 and gave a dose of acetazolamide with CO2 elevation on chemistry.  Will start p.o. Lasix today.  CT scan commenting on elevated right heart pressures.  Tobacco use disorder Must stop smoking.  Headache Tylenol  ordered.  IV magnesium  given on 11/5.  Increased anion gap metabolic acidosis Resolved  Hypernatremia Resolved  Depression Patient on Lexapro  and Seroquel  at night  Protein-calorie malnutrition, severe Continue supplements        Subjective: Patient still feels short of breath.  With his high pCO2 I advised him to try the BiPAP at night to see if that helps.  Mated with COPD and CHF exacerbation.  Physical Exam: Vitals:   10/23/24 0403 10/23/24 0500 10/23/24 0805 10/23/24 0823  BP: 114/83  114/81   Pulse: 73  80   Resp: 20  18   Temp: 97.6 F (36.4 C)  97.9 F (36.6 C)   TempSrc: Oral  Oral   SpO2: 94%  92% 95%  Weight:  54.1 kg    Height:       Physical Exam HENT:     Head: Normocephalic.     Mouth/Throat:     Pharynx: No oropharyngeal exudate.  Eyes:      General: Lids are normal.     Conjunctiva/sclera: Conjunctivae normal.  Cardiovascular:     Rate and Rhythm: Normal rate and regular rhythm.     Heart sounds: Normal heart sounds, S1 normal and S2 normal.  Pulmonary:     Breath sounds: Examination of the right-lower field reveals decreased breath sounds. Examination of the left-lower field reveals decreased breath sounds. Decreased breath sounds present. No wheezing, rhonchi or rales.  Abdominal:     Palpations: Abdomen is soft.     Tenderness: There is no abdominal tenderness.  Musculoskeletal:     Right lower leg: Swelling present.     Left lower leg: Swelling present.  Skin:    General: Skin is warm.     Findings: No rash.  Neurological:     Mental Status: He is alert and oriented to person, place, and time.     Data Reviewed: pH 7.3 with pCO2 of 81, creatinine 0.8, CO2 on chemistry 32, CRP 0.8  Disposition: Status is: Inpatient Remains inpatient appropriate because: Since patient is homeless and still requiring oxygen we will have to figure out a place for him to live or get him off oxygen prior to disposition to the street.  TOC on case.  Planned Discharge Destination: Will need placement if we can get him off oxygen versus to the street if we are able to get him off oxygen.    Time spent: 28 minutes Case discussed with pulmonary  Author: Charlie Patterson, MD 10/23/2024 3:15 PM  For on call review [email or web address].

## 2024-10-23 NOTE — Plan of Care (Signed)

## 2024-10-23 NOTE — Progress Notes (Signed)
 Rounded on patient. Patient remains on oxygen via [place]. States he is feeling better and is trying to rest as much as [he] can. Patient made aware of referral to [facility]. States no one has called as of yet. Patient very appreciative of all the assistance provided by the entire team.

## 2024-10-23 NOTE — Progress Notes (Signed)
 PULMONOLOGY         Date: 10/23/2024,   MRN# [id number] Giulian Goldring. 06-Jun-1966     AdmissionWeight: 58.8 kg                 CurrentWeight: 54.1 kg  Referring provider: Dr Josette   CHIEF COMPLAINT:   Acute exacerbation of CHF and COPD   HISTORY OF PRESENT ILLNESS   This is a 58 yo M with hx of chronic cough, COPD, CHF, Urolithiasis, tobacco dependence who came in due to worsening SOB.  [name] hypoxemic requiring HHFNC at 15L/min to reach normoxia.  Since admission he has partially improved with weaning down of O2 req.  He had CT chest with PE protocol and was found to have chronic bronchitic COPD and centrilobular emphysema with no venous thromboemoblism.  He also had noted RV dilation suggestive of RV failure.  Cardiac biomarkers were mildy elevated consisted with acute on chronic CHF. He had septic workup performed and had steroids with antibiotics prescribed. He also had DVT studies and this was negative for bilateral LE dvt.  Patient requested pulmonary evaluation and for this reason consultation was placed.   10/22/24- patient is improved, CXR repeating today. Preparing for dc home in AM. Plan to wean steroids and abx to PO regimen on outpatient basis.   10/23/24- patient is able to ambulate around hallway.  He did have very transient mild hypoxemia. He is improved quite a bit. We discussed smoking cessation.  He is concerned about his homelessness. His blood work is improved and blood gas is with chronic hypercapnic respiratory failure.   PAST MEDICAL HISTORY   Past Medical History:  Diagnosis Date   Chronic cough    Urolithiasis    Urolithiasis      SURGICAL HISTORY   Past Surgical History:  Procedure Laterality Date   CATARACT EXTRACTION W/PHACO Left 08/09/2021   Procedure: CATARACT EXTRACTION PHACO AND INTRAOCULAR LENS PLACEMENT (IOC) LEFT .051 00:10.0;  Surgeon: Mittie Gaskin, MD;  Location: [facility];  Service:  Ophthalmology;  Laterality: Left;   CATARACT EXTRACTION W/PHACO Right 10/04/2021   Procedure: CATARACT EXTRACTION PHACO AND INTRAOCULAR LENS PLACEMENT (IOC) RIGHT 0.67 00:13.8;  Surgeon: Mittie Gaskin, MD;  Location: [facility];  Service: Ophthalmology;  Laterality: Right;  general anesthesia needs to be last case   HERNIA REPAIR     x2   MANDIBLE FRACTURE SURGERY       FAMILY HISTORY   History reviewed. No pertinent family history.   SOCIAL HISTORY   Social History   Tobacco Use   Smoking status: Every Day    Current packs/day: 1.00    Average packs/day: 1 pack/day for 35.0 years (35.0 ttl pk-yrs)    Types: Cigarettes   Smokeless tobacco: Never   Tobacco comments:    Started smoking age 53  Vaping Use   Vaping status: Never Used  Substance Use Topics   Alcohol use: No   Drug use: Not Currently     MEDICATIONS    Home Medication:    Current Medication:  Current Facility-Administered Medications:    acetaminophen  (TYLENOL ) tablet 650 mg, 650 mg, Oral, Q6H PRN, Wieting, Richard, MD, 650 mg at 10/22/24 1452   acetylcysteine (MUCOMYST) 20 % nebulizer / oral solution 3 mL, 3 mL, Nebulization, BID, Wieting, Richard, MD, 3 mL at 10/23/24 0823   albuterol  (PROVENTIL ) (2.5 MG/3ML) 0.083% nebulizer solution 2.5 mg, 2.5 mg, Nebulization, Q2H PRN, Foust, Katy  L, NP, 2.5 mg at 10/23/24 9176   ALPRAZolam SHEFFIELD) tablet 0.25 mg, 0.25 mg, Oral, TID PRN, Josette Ade, MD, 0.25 mg at 10/22/24 0604   artificial tears ophthalmic solution 1 drop, 1 drop, Both Eyes, PRN, Foust, Katy L, NP   azithromycin  (ZITHROMAX ) tablet 250 mg, 250 mg, Oral, Daily, Tirsa Gail, MD, 250 mg at 10/23/24 9170   enoxaparin  (LOVENOX ) injection 40 mg, 40 mg, Subcutaneous, Q24H, Foust, Katy L, NP, 40 mg at 10/22/24 1708   escitalopram  (LEXAPRO ) tablet 10 mg, 10 mg, Oral, Daily, Smith, Annie B, NP, 10 mg at 10/23/24 0830   feeding supplement (ENSURE PLUS HIGH PROTEIN) liquid 237 mL, 237  mL, Oral, BID BM, Wieting, Richard, MD   guaiFENesin  (MUCINEX ) 12 hr tablet 600 mg, 600 mg, Oral, BID, Foust, Katy L, NP, 600 mg at 10/23/24 0830   melatonin tablet 5 mg, 5 mg, Oral, QHS PRN, Foust, Katy L, NP   methylPREDNISolone  sodium succinate (SOLU-MEDROL ) 40 mg/mL injection 40 mg, 40 mg, Intravenous, Daily, Wieting, Richard, MD, 40 mg at 10/23/24 0831   multivitamin with minerals tablet 1 tablet, 1 tablet, Oral, Daily, Wieting, Richard, MD, 1 tablet at 10/23/24 0830   nicotine  (NICODERM CQ  - dosed in mg/24 hours) patch 14 mg, 14 mg, Transdermal, Daily, Foust, Katy L, NP, 14 mg at 10/23/24 9166   nicotine  polacrilex (NICORETTE) gum 2 mg, 2 mg, Oral, PRN, Foust, Katy L, NP   pantoprazole (PROTONIX) EC tablet 40 mg, 40 mg, Oral, Daily, Foust, Katy L, NP, 40 mg at 10/23/24 0830   QUEtiapine  (SEROQUEL ) tablet 25 mg, 25 mg, Oral, QHS, Smith, Annie B, NP, 25 mg at 10/22/24 2032    ALLERGIES   Patient has no known allergies.     REVIEW OF SYSTEMS    Review of Systems:  Gen:  Denies  fever, sweats, chills weigh loss  HEENT: Denies blurred vision, double vision, ear pain, eye pain, hearing loss, nose bleeds, sore throat Cardiac:  No dizziness, chest pain or heaviness, chest tightness,edema Resp:   reports dyspnea chronically  Gi: Denies swallowing difficulty, stomach pain, nausea or vomiting, diarrhea, constipation, bowel incontinence Gu:  Denies bladder incontinence, burning urine Ext:   Denies Joint pain, stiffness or swelling Skin: Denies  skin rash, easy bruising or bleeding or hives Endoc:  Denies polyuria, polydipsia , polyphagia or weight change Psych:   Denies depression, insomnia or hallucinations   Other:  All other systems negative   VS: BP 114/81 (BP Location: Left Arm)   Pulse 80   Temp 97.9 F (36.6 C) (Oral)   Resp 18   Ht 5' 4 (1.626 m)   Wt 54.1 kg   SpO2 95%   BMI 20.47 kg/m      PHYSICAL EXAM    GENERAL:NAD, no fevers, chills, no weakness no  fatigue HEAD: Normocephalic, atraumatic.  EYES: Pupils equal, round, reactive to light. Extraocular muscles intact. No scleral icterus.  MOUTH: Moist mucosal membrane. Dentition intact. No abscess noted.  EAR, NOSE, THROAT: Clear without exudates. No external lesions.  NECK: Supple. No thyromegaly. No nodules. No JVD.  PULMONARY: decreased breath sounds with mild rhonchi worse at bases bilaterally.  CARDIOVASCULAR: S1 and S2. Regular rate and rhythm. No murmurs, rubs, or gallops. No edema. Pedal pulses 2+ bilaterally.  GASTROINTESTINAL: Soft, nontender, nondistended. No masses. Positive bowel sounds. No hepatosplenomegaly.  MUSCULOSKELETAL: No swelling, clubbing, or edema. Range of motion full in all extremities.  NEUROLOGIC: Cranial nerves II through XII are intact. No gross  focal neurological deficits. Sensation intact. Reflexes intact.  SKIN: No ulceration, lesions, rashes, or cyanosis. Skin warm and dry. Turgor intact.  PSYCHIATRIC: Mood, affect within normal limits. The patient is awake, alert and oriented x 3. Insight, judgment intact.       IMAGING     rative & Impression  EXAM: CTA of the Chest with contrast for PE 10/20/2024 02:19:01 PM   TECHNIQUE: CTA of the chest was performed without and with the administration of 75 mL of iohexol  (OMNIPAQUE ) 350 MG/ML injection. Multiplanar reformatted images are provided for review. MIP images are provided for review. Automated exposure control, iterative reconstruction, and/or weight based adjustment of the mA/kV was utilized to reduce the radiation dose to as low as reasonably achievable.   COMPARISON: Same day x-ray and CTA chest 01/04/2023.   CLINICAL HISTORY: Pulmonary embolism (PE) suspected, high prob.   FINDINGS:   PULMONARY ARTERIES: Pulmonary arteries are adequately opacified for evaluation. Negative for pulmonary embolism. Main pulmonary artery is normal in caliber.   MEDIASTINUM: Dilated right ventricle with  reflux of contrast into the IVC and hepatic veins compatible with elevated right heart pressures. There is no acute abnormality of the thoracic aorta.   LYMPH NODES: No mediastinal, hilar or axillary lymphadenopathy.   LUNGS AND PLEURA: Diffuse bronchial wall thickening. Mucous plugging in the lower lobes. Emphysema. No focal consolidation or pulmonary edema. No pleural effusion or pneumothorax.   UPPER ABDOMEN: Limited images of the upper abdomen are unremarkable.   SOFT TISSUES AND BONES: No acute bone or soft tissue abnormality.   IMPRESSION: 1. No pulmonary embolism. 2. Dilated right ventricle with reflux of contrast into the IVC and hepatic veins, compatible with elevated right heart pressures. 3. Chronic bronchitis. 4. Emphysema. Given patient age, consider evaluation for low-dose CT lung cancer screening.   Electronically signed by: Norman Gatlin MD 10/20/2024 02:49 PM EST RP Workstation: [id number]      ASSESSMENT/PLAN     Acute exacerbation of COPD- RESOLVED   - continue nebulizer therapy Duoneb 16h prn   - s/p solumedrol 125x1 followed by 40mg  IV daily -wean to pred  -adding zithromax  PO - PT/OT -Incentive spirometry and flutter valve    Mucus plugging of tracheobronchial tree    - considering outpatient bronchoscopy    Tobacco dependence   - 14mg  transdermal patch     Enlarged Right ventricle Suggestive of pulmonary hypertension - would recommend [facility] via cardiology service when possible non urgently    Psychiatric history   Patient is currently being evaluated by psychiatrist                Thank you for allowing me to participate in the care of this patient.   Patient/Family are satisfied with care plan and all questions have been answered.    Provider disclosure: Patient with at least one acute or chronic illness or injury that poses a threat to life or bodily function and is being managed actively during this encounter.  All  of the below services have been performed independently by signing provider:  review of prior documentation from internal and or external health records.  Review of previous and current lab results.  Interview and comprehensive assessment during patient visit today. Review of current and previous chest radiographs/CT scans. Discussion of management and test interpretation with health care team and patient/family.   This document was prepared using Dragon voice recognition software and may include unintentional dictation errors.     Kieley Akter, M.D.  Division  of Pulmonary & Critical Care Medicine

## 2024-10-23 NOTE — Consult Note (Addendum)
 [facility] Psychiatric Consult Follow up  Patient Name: .Corey House.  MRN: [id number]  DOB: 1966-04-20  Consult Order details:  Orders (From admission, onward)     Start     Ordered   10/21/24 1005  IP CONSULT TO PSYCHIATRY       Comments: Dr Josette savoy  Ordering Provider: Josette Ade, MD  Provider:  Donnelly Mellow, MD  Question Answer Comment  Location [facility]   Reason for Consult? lost will to live      10/21/24 1005             Mode of Visit: In person    Psychiatry Consult Evaluation  Service Date: October 23, 2024 LOS:  LOS: 3 days  Chief Complaint I've got a lot going on  Primary Psychiatric Diagnoses  Hx of depression   Assessment  Corey House. is a 58 y.o. male admitted: Medically  Psychiatry was consulted by medical team due to concerns about suicidal ideation.  10/23/2024:  On assessment patient consistently denies SI/HI/plan,He reports feeling better as TOC is looking into housing options. He is taking medications with no reported side effects but good benefits. At this time, patient does not appear to be a risk to self or others.While future psychiatric events cannot be accurately predicted, the patient does not currently require acute inpatient psychiatric care and does not currently meet [place]  involuntary commitment criteria.  Continue Lexapro  and Seroquel . Patient is psychiatrically stable at this time.   Diagnoses:  Active Hospital problems: Principal Problem:   Acute respiratory failure with hypoxia and hypercapnia (HCC) Active Problems:   COPD with acute exacerbation (HCC)   Tobacco use disorder   Protein-calorie malnutrition, severe   Depression   Acute on chronic diastolic CHF (congestive heart failure) (HCC)   Headache   Increased anion gap metabolic acidosis   Hypernatremia    Plan   ## Psychiatric Medication Recommendations:  continue lexapro  10 mg daily and 25 mg  seroquel  nightly (patient's previous home medications)  ## Medical Decision Making Capacity: Not specifically addressed in this encounter  ## Further Work-up:   -- most recent EKG on 10/20/2024 had QtC of 506 -- Pertinent labwork reviewed earlier this admission includes: cbc, cmp    ## Disposition:-- There are no psychiatric contraindications to discharge at this time  ## Behavioral / Environmental: - No specific recommendations at this time.     ## Safety and Observation Level:  - Based on my clinical evaluation, I estimate the patient to be at low risk of self harm in the current setting. - At this time, we recommend  routine. This decision is based on my review of the chart including patient's history and current presentation, interview of the patient, mental status examination, and consideration of suicide risk including evaluating suicidal ideation, plan, intent, suicidal or self-harm behaviors, risk factors, and protective factors. This judgment is based on our ability to directly address suicide risk, implement suicide prevention strategies, and develop a safety plan while the patient is in the clinical setting. Please contact our team if there is a concern that risk level has changed.  CSSR Risk Category:C-SSRS RISK CATEGORY: No Risk  Suicide Risk Assessment: Patient has following modifiable risk factors for suicide: medication noncompliance and triggering events, which we are addressing by restarting patient's home psychiatric medications while medically hospitalized and providing therapeutic communication for patient to discuss life stressors. Patient has following non-modifiable or demographic risk factors for suicide: male  gender and psychiatric hospitalization Patient has the following protective factors against suicide: Access to outpatient mental health care  Thank you for this consult request. Recommendations have been communicated to the primary team.  We will sign  off  Corey House.MD        History of Present Illness  Relevant Aspects of Hospital Hospital   Patient Report:  10/23/24: Patient is noted to be resting in bed.  He is noted to be talking to one of the nurses.  He reports feeling better since the [facility] is helping him with placement.  He remains frustrated about his declining physical health and talks about how his breathing becomes difficult when he tries to go to bathroom or bends down.  Patient denies SI/HI/plan and denies auditory/visual hallucinations.  Patient is educated being on Lexapro  and Seroquel .  He reports that Seroquel  is helping him sleep better.  Patient was educated about Lexapro  taking 4 to 6 weeks to show optimal response and his depression. Psych ROS:  Depression: improving Anxiety: Denied Mania (lifetime and current): Denied Psychosis: (lifetime and current): Denied  Collateral information:  Unable to reach anyone at numbers provided by patient    Psychiatric and Social History  Psychiatric History:  Information collected from patient and chart review  Prev Dx/Sx: Major depressive disorder Current Psych Provider: Reported unable to make it to follow-up appointments with [facility] due to financial strains Home Meds (current): Lexapro  and Seroquel  Previous Med Trials: Reported trying many previous medications with no success Therapy: See above under current psych provider  Prior Psych Hospitalization: Yes Prior Self Harm: Reported history of suicidal thoughts, but denied any previous suicide attempts or self-harm Prior Violence: Denied  Family Psych History: Unknown Family Hx suicide: Unknown  Social History:   Educational Hx: Unknown Occupational Hx: Reported inability to work related to his chronic medical conditions-not currently receiving disability checks Legal Hx: Denied Living Situation: Reported living placed a place wherever I can.  Reported being homeless Spiritual Hx: Unknown Access to  weapons/lethal means: Denied  Substance History Alcohol: Reported occasional use History of alcohol withdrawal seizures denied History of DT's denied Tobacco: Daily cigarette use Illicit drugs: Denied Prescription drug abuse: Denied Rehab hx: Denied  Exam Findings  Physical Exam: Deferred to hospitalist-note reviewed Vital Signs:  Temp:  [97.6 F (36.4 C)-98.7 F (37.1 C)] 98.3 F (36.8 C) (11/07 1943) Pulse Rate:  [73-80] 78 (11/07 1943) Resp:  [17-20] 17 (11/07 1943) BP: (109-131)/(81-90) 131/90 (11/07 1943) SpO2:  [92 %-95 %] 93 % (11/07 1943) Weight:  [54.1 kg] 54.1 kg (11/07 0500) Blood pressure (!) 131/90, pulse 78, temperature 98.3 F (36.8 C), temperature source Oral, resp. rate 17, height 5' 4 (1.626 m), weight 54.1 kg, SpO2 93%. Body mass index is 20.47 kg/m.    Mental Status Exam: General Appearance: Casual  Orientation:  Full (Time, Place, and Person)  Memory:  Immediate;   Good Recent;   Good Remote;   Good  Concentration:  Concentration: Good  Recall:  Fair  Attention  Fair  Eye Contact:  Good  Speech:  Clear and Coherent  Language:  Good  Volume:  Normal  Mood: Tired  Affect:  Appropriate and Congruent  Thought Process:  Coherent, Goal Directed, and Linear  Thought Content:  Logical  Suicidal Thoughts:  No  Homicidal Thoughts:  No  Judgement:  Fair  Insight:  Fair and Present  Psychomotor Activity:  Normal  Akathisia:  No  Fund of Knowledge:  Fair  Assets:  Communication Skills Desire for Improvement  Cognition:  WNL  ADL's:  Intact  AIMS (if indicated):        Other History   These have been pulled in through the EMR, reviewed, and updated if appropriate.  Family History:  The patient's family history is not on file.  Medical History: Past Medical History:  Diagnosis Date   Chronic cough    Urolithiasis    Urolithiasis     Surgical History: Past Surgical History:  Procedure Laterality Date   CATARACT EXTRACTION  W/PHACO Left 08/09/2021   Procedure: CATARACT EXTRACTION PHACO AND INTRAOCULAR LENS PLACEMENT (IOC) LEFT .051 00:10.0;  Surgeon: Mittie Gaskin, MD;  Location: [facility];  Service: Ophthalmology;  Laterality: Left;   CATARACT EXTRACTION W/PHACO Right 10/04/2021   Procedure: CATARACT EXTRACTION PHACO AND INTRAOCULAR LENS PLACEMENT (IOC) RIGHT 0.67 00:13.8;  Surgeon: Mittie Gaskin, MD;  Location: [facility];  Service: Ophthalmology;  Laterality: Right;  general anesthesia needs to be last case   HERNIA REPAIR     x2   MANDIBLE FRACTURE SURGERY       Medications:   Current Facility-Administered Medications:    acetaminophen  (TYLENOL ) tablet 650 mg, 650 mg, Oral, Q6H PRN, Josette Ade, MD, 650 mg at 10/22/24 1452   albuterol  (PROVENTIL ) (2.5 MG/3ML) 0.083% nebulizer solution 2.5 mg, 2.5 mg, Nebulization, Q2H PRN, Foust, Katy L, NP, 2.5 mg at 10/23/24 9176   ALPRAZolam (XANAX) tablet 0.25 mg, 0.25 mg, Oral, TID PRN, Josette Ade, MD, 0.25 mg at 10/22/24 0604   artificial tears ophthalmic solution 1 drop, 1 drop, Both Eyes, PRN, Foust, Katy L, NP   azithromycin  (ZITHROMAX ) tablet 250 mg, 250 mg, Oral, Daily, Aleskerov, Fuad, MD, 250 mg at 10/23/24 9170   enoxaparin  (LOVENOX ) injection 40 mg, 40 mg, Subcutaneous, Q24H, Foust, Katy L, NP, 40 mg at 10/23/24 1715   escitalopram  (LEXAPRO ) tablet 10 mg, 10 mg, Oral, Daily, Smith, Annie B, NP, 10 mg at 10/23/24 0830   feeding supplement (ENSURE PLUS HIGH PROTEIN) liquid 237 mL, 237 mL, Oral, BID BM, Wieting, Richard, MD   furosemide (LASIX) tablet 20 mg, 20 mg, Oral, Daily, Wieting, Richard, MD, 20 mg at 10/23/24 1017   guaiFENesin  (MUCINEX ) 12 hr tablet 600 mg, 600 mg, Oral, BID, Foust, Katy L, NP, 600 mg at 10/23/24 0830   melatonin tablet 5 mg, 5 mg, Oral, QHS PRN, Foust, Katy L, NP   multivitamin with minerals tablet 1 tablet, 1 tablet, Oral, Daily, Wieting, Richard, MD, 1 tablet at 10/23/24 0830   nicotine   (NICODERM CQ  - dosed in mg/24 hours) patch 14 mg, 14 mg, Transdermal, Daily, Foust, Katy L, NP, 14 mg at 10/23/24 9166   nicotine  polacrilex (NICORETTE) gum 2 mg, 2 mg, Oral, PRN, Foust, Katy L, NP   pantoprazole (PROTONIX) EC tablet 40 mg, 40 mg, Oral, Daily, Foust, Katy L, NP, 40 mg at 10/23/24 0830   [START ON 10/24/2024] predniSONE  (DELTASONE ) tablet 50 mg, 50 mg, Oral, Q breakfast **FOLLOWED BY** [START ON 10/25/2024] predniSONE  (DELTASONE ) tablet 45 mg, 45 mg, Oral, Q breakfast **FOLLOWED BY** [START ON 10/26/2024] predniSONE  (DELTASONE ) tablet 40 mg, 40 mg, Oral, Q breakfast **FOLLOWED BY** [START ON 10/27/2024] predniSONE  (DELTASONE ) tablet 35 mg, 35 mg, Oral, Q breakfast **FOLLOWED BY** [START ON 10/28/2024] predniSONE  (DELTASONE ) tablet 30 mg, 30 mg, Oral, Q breakfast **FOLLOWED BY** [START ON 10/29/2024] predniSONE  (DELTASONE ) tablet 25 mg, 25 mg, Oral, Q breakfast **FOLLOWED BY** [START ON 10/30/2024] predniSONE  (DELTASONE ) tablet 20 mg, 20  mg, Oral, Q breakfast **FOLLOWED BY** [START ON 10/31/2024] predniSONE  (DELTASONE ) tablet 15 mg, 15 mg, Oral, Q breakfast **FOLLOWED BY** [START ON 11/01/2024] predniSONE  (DELTASONE ) tablet 10 mg, 10 mg, Oral, Q breakfast **FOLLOWED BY** [START ON 11/02/2024] predniSONE  (DELTASONE ) tablet 5 mg, 5 mg, Oral, Q breakfast, Aleskerov, Fuad, MD   QUEtiapine  (SEROQUEL ) tablet 25 mg, 25 mg, Oral, QHS, Smith, Annie B, NP, 25 mg at 10/22/24 2032  Allergies: No Known Allergies  Laurie Lovejoy.MD

## 2024-10-24 DIAGNOSIS — J9601 Acute respiratory failure with hypoxia: Secondary | ICD-10-CM | POA: Diagnosis not present

## 2024-10-24 DIAGNOSIS — I5033 Acute on chronic diastolic (congestive) heart failure: Secondary | ICD-10-CM | POA: Diagnosis not present

## 2024-10-24 DIAGNOSIS — J441 Chronic obstructive pulmonary disease with (acute) exacerbation: Secondary | ICD-10-CM | POA: Diagnosis not present

## 2024-10-24 DIAGNOSIS — F17200 Nicotine dependence, unspecified, uncomplicated: Secondary | ICD-10-CM | POA: Diagnosis not present

## 2024-10-24 NOTE — Progress Notes (Signed)
 SATURATION QUALIFICATIONS: (This note is used to comply with regulatory documentation for home oxygen)  Patient Saturations on Room Air at Rest = 93%  Patient Saturations on Room Air while Ambulating = 84%  Patient Saturations on 3 Liters of oxygen while Ambulating = 91%  Please briefly explain why patient needs home oxygen: Patient's oxygen saturation drops to 84% while walking and talking on room air and his shortness of breath markedly increases to the point he had to stop. Oxygen saturation only increased after I turned on oxygen to 3 liters/ min.

## 2024-10-24 NOTE — Progress Notes (Signed)
 PULMONOLOGY         Date: 10/24/2024,   MRN# [id number] Corey House. [birth year]     AdmissionWeight: 58.8 kg                 CurrentWeight: 49.9 kg  Referring provider: Dr Josette   CHIEF COMPLAINT:   Acute exacerbation of CHF and COPD   HISTORY OF PRESENT ILLNESS   This is a 59 yo M with hx of chronic cough, COPD, CHF, Urolithiasis, tobacco dependence who came in due to worsening SOB.  [name] hypoxemic requiring HHFNC at 15L/min to reach normoxia.  Since admission he has partially improved with weaning down of O2 req.  He had CT chest with PE protocol and was found to have chronic bronchitic COPD and centrilobular emphysema with no venous thromboemoblism.  He also had noted RV dilation suggestive of RV failure.  Cardiac biomarkers were mildy elevated consisted with acute on chronic CHF. He had septic workup performed and had steroids with antibiotics prescribed. He also had DVT studies and this was negative for bilateral LE dvt.  Patient requested pulmonary evaluation and for this reason consultation was placed.   10/22/24- patient is improved, CXR repeating today. Preparing for dc home in AM. Plan to wean steroids and abx to PO regimen on outpatient basis.   10/23/24- patient is able to ambulate around hallway.  He did have very transient mild hypoxemia. He is improved quite a bit. We discussed smoking cessation.  He is concerned about his homelessness. His blood work is improved and blood gas is with chronic hypercapnic respiratory failure.   10/24/24- patient is resting in bed comfortably, reports expectoration of mucus this am and feels slightly better.  He was able to walk around hallway today. CXR is clear.  Plan to wean off oxygen and dc home when possible.  He's on zithromax  250 PO and prednisone  taper. He should use IS and flutter valve.   PAST MEDICAL HISTORY   Past Medical History:  Diagnosis Date   Chronic cough    Urolithiasis     Urolithiasis      SURGICAL HISTORY   Past Surgical History:  Procedure Laterality Date   CATARACT EXTRACTION W/PHACO Left 08/09/2021   Procedure: CATARACT EXTRACTION PHACO AND INTRAOCULAR LENS PLACEMENT (IOC) LEFT .051 00:10.0;  Surgeon: Mittie Gaskin, MD;  Location: [facility];  Service: Ophthalmology;  Laterality: Left;   CATARACT EXTRACTION W/PHACO Right 10/04/2021   Procedure: CATARACT EXTRACTION PHACO AND INTRAOCULAR LENS PLACEMENT (IOC) RIGHT 0.67 00:13.8;  Surgeon: Mittie Gaskin, MD;  Location: [facility];  Service: Ophthalmology;  Laterality: Right;  general anesthesia needs to be last case   HERNIA REPAIR     x2   MANDIBLE FRACTURE SURGERY       FAMILY HISTORY   History reviewed. No pertinent family history.   SOCIAL HISTORY   Social History   Tobacco Use   Smoking status: Every Day    Current packs/day: 1.00    Average packs/day: 1 pack/day for 35.0 years (35.0 ttl pk-yrs)    Types: Cigarettes   Smokeless tobacco: Never   Tobacco comments:    Started smoking age 30  Vaping Use   Vaping status: Never Used  Substance Use Topics   Alcohol use: No   Drug use: Not Currently     MEDICATIONS    Home Medication:    Current Medication:  Current Facility-Administered Medications:    acetaminophen  (TYLENOL )  tablet 650 mg, 650 mg, Oral, Q6H PRN, Josette Ade, MD, 650 mg at 10/22/24 1452   albuterol  (PROVENTIL ) (2.5 MG/3ML) 0.083% nebulizer solution 2.5 mg, 2.5 mg, Nebulization, Q2H PRN, Foust, Katy L, NP, 2.5 mg at 10/23/24 9176   ALPRAZolam (XANAX) tablet 0.25 mg, 0.25 mg, Oral, TID PRN, Josette Ade, MD, 0.25 mg at 10/23/24 2046   artificial tears ophthalmic solution 1 drop, 1 drop, Both Eyes, PRN, Foust, Katy L, NP   azithromycin  (ZITHROMAX ) tablet 250 mg, 250 mg, Oral, Daily, Selden Noteboom, MD, 250 mg at 10/23/24 9170   enoxaparin  (LOVENOX ) injection 40 mg, 40 mg, Subcutaneous, Q24H, Foust, Katy L, NP, 40 mg at  10/23/24 1715   escitalopram  (LEXAPRO ) tablet 10 mg, 10 mg, Oral, Daily, Smith, Annie B, NP, 10 mg at 10/23/24 0830   feeding supplement (ENSURE PLUS HIGH PROTEIN) liquid 237 mL, 237 mL, Oral, BID BM, Wieting, Richard, MD   furosemide (LASIX) tablet 20 mg, 20 mg, Oral, Daily, Wieting, Richard, MD, 20 mg at 10/23/24 1017   guaiFENesin  (MUCINEX ) 12 hr tablet 600 mg, 600 mg, Oral, BID, Foust, Katy L, NP, 600 mg at 10/23/24 2042   melatonin tablet 5 mg, 5 mg, Oral, QHS PRN, Foust, Katy L, NP   multivitamin with minerals tablet 1 tablet, 1 tablet, Oral, Daily, Wieting, Richard, MD, 1 tablet at 10/23/24 0830   nicotine  (NICODERM CQ  - dosed in mg/24 hours) patch 14 mg, 14 mg, Transdermal, Daily, Foust, Katy L, NP, 14 mg at 10/23/24 9166   nicotine  polacrilex (NICORETTE) gum 2 mg, 2 mg, Oral, PRN, Foust, Katy L, NP   pantoprazole (PROTONIX) EC tablet 40 mg, 40 mg, Oral, Daily, Foust, Katy L, NP, 40 mg at 10/23/24 0830   predniSONE  (DELTASONE ) tablet 50 mg, 50 mg, Oral, Q breakfast **FOLLOWED BY** [START ON 10/25/2024] predniSONE  (DELTASONE ) tablet 45 mg, 45 mg, Oral, Q breakfast **FOLLOWED BY** [START ON 10/26/2024] predniSONE  (DELTASONE ) tablet 40 mg, 40 mg, Oral, Q breakfast **FOLLOWED BY** [START ON 10/27/2024] predniSONE  (DELTASONE ) tablet 35 mg, 35 mg, Oral, Q breakfast **FOLLOWED BY** [START ON 10/28/2024] predniSONE  (DELTASONE ) tablet 30 mg, 30 mg, Oral, Q breakfast **FOLLOWED BY** [START ON 10/29/2024] predniSONE  (DELTASONE ) tablet 25 mg, 25 mg, Oral, Q breakfast **FOLLOWED BY** [START ON 10/30/2024] predniSONE  (DELTASONE ) tablet 20 mg, 20 mg, Oral, Q breakfast **FOLLOWED BY** [START ON 10/31/2024] predniSONE  (DELTASONE ) tablet 15 mg, 15 mg, Oral, Q breakfast **FOLLOWED BY** [START ON 11/01/2024] predniSONE  (DELTASONE ) tablet 10 mg, 10 mg, Oral, Q breakfast **FOLLOWED BY** [START ON 11/02/2024] predniSONE  (DELTASONE ) tablet 5 mg, 5 mg, Oral, Q breakfast, Cornelious Bartolucci, MD   QUEtiapine  (SEROQUEL ) tablet  25 mg, 25 mg, Oral, QHS, Smith, Annie B, NP, 25 mg at 10/23/24 2042    ALLERGIES   Patient has no known allergies.     REVIEW OF SYSTEMS    Review of Systems:  Gen:  Denies  fever, sweats, chills weigh loss  HEENT: Denies blurred vision, double vision, ear pain, eye pain, hearing loss, nose bleeds, sore throat Cardiac:  No dizziness, chest pain or heaviness, chest tightness,edema Resp:   reports dyspnea chronically  Gi: Denies swallowing difficulty, stomach pain, nausea or vomiting, diarrhea, constipation, bowel incontinence Gu:  Denies bladder incontinence, burning urine Ext:   Denies Joint pain, stiffness or swelling Skin: Denies  skin rash, easy bruising or bleeding or hives Endoc:  Denies polyuria, polydipsia , polyphagia or weight change Psych:   Denies depression, insomnia or hallucinations   Other:  All other systems  negative   VS: BP 113/88 (BP Location: Left Arm)   Pulse 69   Temp 98 F (36.7 C)   Resp 18   Ht 5' 4 (1.626 m)   Wt 49.9 kg   SpO2 96%   BMI 18.88 kg/m      PHYSICAL EXAM    GENERAL:NAD, no fevers, chills, no weakness no fatigue HEAD: Normocephalic, atraumatic.  EYES: Pupils equal, round, reactive to light. Extraocular muscles intact. No scleral icterus.  MOUTH: Moist mucosal membrane. Dentition intact. No abscess noted.  EAR, NOSE, THROAT: Clear without exudates. No external lesions.  NECK: Supple. No thyromegaly. No nodules. No JVD.  PULMONARY: decreased breath sounds with mild rhonchi worse at bases bilaterally.  CARDIOVASCULAR: S1 and S2. Regular rate and rhythm. No murmurs, rubs, or gallops. No edema. Pedal pulses 2+ bilaterally.  GASTROINTESTINAL: Soft, nontender, nondistended. No masses. Positive bowel sounds. No hepatosplenomegaly.  MUSCULOSKELETAL: No swelling, clubbing, or edema. Range of motion full in all extremities.  NEUROLOGIC: Cranial nerves II through XII are intact. No gross focal neurological deficits. Sensation intact.  Reflexes intact.  SKIN: No ulceration, lesions, rashes, or cyanosis. Skin warm and dry. Turgor intact.  PSYCHIATRIC: Mood, affect within normal limits. The patient is awake, alert and oriented x 3. Insight, judgment intact.       IMAGING     rative & Impression  EXAM: CTA of the Chest with contrast for PE 10/20/2024 02:19:01 PM   TECHNIQUE: CTA of the chest was performed without and with the administration of 75 mL of iohexol  (OMNIPAQUE ) 350 MG/ML injection. Multiplanar reformatted images are provided for review. MIP images are provided for review. Automated exposure control, iterative reconstruction, and/or weight based adjustment of the mA/kV was utilized to reduce the radiation dose to as low as reasonably achievable.   COMPARISON: Same day x-ray and CTA chest 01/04/2023.   CLINICAL HISTORY: Pulmonary embolism (PE) suspected, high prob.   FINDINGS:   PULMONARY ARTERIES: Pulmonary arteries are adequately opacified for evaluation. Negative for pulmonary embolism. Main pulmonary artery is normal in caliber.   MEDIASTINUM: Dilated right ventricle with reflux of contrast into the IVC and hepatic veins compatible with elevated right heart pressures. There is no acute abnormality of the thoracic aorta.   LYMPH NODES: No mediastinal, hilar or axillary lymphadenopathy.   LUNGS AND PLEURA: Diffuse bronchial wall thickening. Mucous plugging in the lower lobes. Emphysema. No focal consolidation or pulmonary edema. No pleural effusion or pneumothorax.   UPPER ABDOMEN: Limited images of the upper abdomen are unremarkable.   SOFT TISSUES AND BONES: No acute bone or soft tissue abnormality.   IMPRESSION: 1. No pulmonary embolism. 2. Dilated right ventricle with reflux of contrast into the IVC and hepatic veins, compatible with elevated right heart pressures. 3. Chronic bronchitis. 4. Emphysema. Given patient age, consider evaluation for low-dose CT lung cancer  screening.   Electronically signed by: Norman Gatlin MD 10/20/2024 02:49 PM EST RP Workstation: [id number]      ASSESSMENT/PLAN     Acute exacerbation of COPD- RESOLVED   - continue nebulizer therapy Duoneb 16h prn   - s/p solumedrol 125x1 followed by 40mg  IV daily -wean to pred  -adding zithromax  PO - PT/OT -Incentive spirometry and flutter valve    Mucus plugging of tracheobronchial tree    - considering outpatient bronchoscopy    Tobacco dependence   - 14mg  transdermal patch     Enlarged Right ventricle Suggestive of pulmonary hypertension - would recommend [facility] via cardiology service when possible  non urgently    Psychiatric history   Patient is currently being evaluated by psychiatrist                Thank you for allowing me to participate in the care of this patient.   Patient/Family are satisfied with care plan and all questions have been answered.    Provider disclosure: Patient with at least one acute or chronic illness or injury that poses a threat to life or bodily function and is being managed actively during this encounter.  All of the below services have been performed independently by signing provider:  review of prior documentation from internal and or external health records.  Review of previous and current lab results.  Interview and comprehensive assessment during patient visit today. Review of current and previous chest radiographs/CT scans. Discussion of management and test interpretation with health care team and patient/family.   This document was prepared using Dragon voice recognition software and may include unintentional dictation errors.     Kaydan Wong, M.D.  Division of Pulmonary & Critical Care Medicine

## 2024-10-24 NOTE — Plan of Care (Signed)

## 2024-10-24 NOTE — Progress Notes (Signed)
 Progress Note   Patient: Corey House. FMW:[id number] DOB: 1966/10/24 DOA: 10/20/2024     4 DOS: the patient was seen and examined on 10/24/2024   Brief hospital course: 58 y.o. year old male with past medical history of  tobacco use disorder reporting a 41 year pack per year history, smoking ~1 pack per day since he was 58 years old. He denies any additional medical history though he has not seen a medical provider in ~20 years. He presents to [facility] ED with shortness of breath that began ~ 9 months ago and worsened ~3 months ago. He reports today his dyspnea is not significantly different than it was 3 months ago. He endorses a chronic cough that has been present since childhood. Her ports the cough is currently, minimally productive with white phlegm. Additionally, he endorses bilateral lower extremity swelling that began in the last 2 days, per his report (R) > (L). On exam the swelling is equal bilaterally. He endorses fatigue and dyspnea that is worse with position changes such as bending over and when standing from lying. He denies chest pain, palpitations, or fever.   ED Course: On arrival to [facility] ED patient was noted to be afebrile temp 36.8C, BP 142/111, HR 88, RR 22, SPO2 100% on 15L non-rebreather. Patient now on 2L via nasal cannula with SPO2 96%. CXR obtained and shows non specific findings of increased interstitial markings. CTA PE obtained and shows no PE, dilated RV and reflux of contract into the IVC and hepatic vein consistent with (R) heart failure, chronic bronchitis, and emphysema. US  DVT (B)LE negative for DVT. Labs notable Negative COVID, flu, RSV,for BNP 284.2, BUN 24, troponin 41--> 38 with no report of chest pain.  Lactic acid 0.9.  Blood cultures drawn and in process.  He was given Lasix, DuoNebs, and Solu-Medrol .  TRH contacted for admission.  11/5.  Pulse ox with ambulation down to 85%.  Patient admitted with COPD and CHF exacerbation.  Patient  also states that he is short of breath and he is waiting to die.  Appreciate psychiatric consultation.  Patient is homeless. 11/6.  Patient states that he has been sleepy.  Venous blood gas shows a pCO2 of 79 with a pH of 7.41.  Will start BiPAP at night.  Try to see if we can taper off oxygen. 11/7.  Patient hypoxic with walking around with pulmonary to 87%. 11/8.  Patient's pulse ox dropped down to 84% with ambulation.  Assessment and Plan: * Acute respiratory failure with hypoxia and hypercapnia (HCC) Pulse ox dropped to 84% with ambulation today.  The patient is homeless will need to be able to come off oxygen prior to disposition.  Continue to try to see if we can get off oxygen on a daily basis.  VBG shows a pCO2 of 81.  Patient unable to tolerate BiPAP.  COPD with acute exacerbation (HCC) Patient on Zithromax , on long prednisone  taper and nebulizer treatments.  Acute on chronic diastolic CHF (congestive heart failure) (HCC) EF 55 to 60%.  Initially on IV Lasix.  On 11/6 and gave a dose of acetazolamide with CO2 elevation on chemistry.  Will continue low-dose p.o Lasix.  CT scan commenting on elevated right heart pressures.  Tobacco use disorder Must stop smoking.  Headache Improved.  Tylenol  as needed.  IV magnesium  given on 11/5.  Increased anion gap metabolic acidosis Resolved  Hypernatremia Resolved  Depression Patient on Lexapro  and Seroquel  at night  Protein-calorie malnutrition, severe  Continue supplements        Subjective: Patient still feels short of breath.  Still feels tired.  Unable to do BiPAP last night.  Admitted with COPD and CHF exacerbation.  Physical Exam: Vitals:   10/23/24 1943 10/24/24 0359 10/24/24 0500 10/24/24 0714  BP: (!) 131/90 118/75  113/88  Pulse: 78 75  69  Resp: 17 16  18   Temp: 98.3 F (36.8 C) 97.6 F (36.4 C)  98 F (36.7 C)  TempSrc: Oral Oral    SpO2: 93% 95%  96%  Weight:   49.9 kg   Height:       Physical  Exam HENT:     Head: Normocephalic.     Mouth/Throat:     Pharynx: No oropharyngeal exudate.  Eyes:     General: Lids are normal.     Conjunctiva/sclera: Conjunctivae normal.  Cardiovascular:     Rate and Rhythm: Normal rate and regular rhythm.     Heart sounds: Normal heart sounds, S1 normal and S2 normal.  Pulmonary:     Breath sounds: Examination of the right-lower field reveals decreased breath sounds. Examination of the left-lower field reveals decreased breath sounds. Decreased breath sounds present. No wheezing, rhonchi or rales.  Abdominal:     Palpations: Abdomen is soft.     Tenderness: There is no abdominal tenderness.  Musculoskeletal:     Right lower leg: Swelling present.     Left lower leg: Swelling present.  Skin:    General: Skin is warm.     Findings: No rash.  Neurological:     Mental Status: He is alert and oriented to person, place, and time.     Data Reviewed: No new data today  Disposition: Status is: Inpatient Remains inpatient appropriate because: Unable to get off oxygen today.  Since the patient is homeless this makes it a difficult situation.  Will need placement.  TOC will try to work on things.  Planned Discharge Destination: Will need placement    Time spent: 27 minutes  Author: Charlie Patterson, MD 10/24/2024 12:10 PM  For on call review [email or web address].

## 2024-10-25 DIAGNOSIS — J9601 Acute respiratory failure with hypoxia: Secondary | ICD-10-CM | POA: Diagnosis not present

## 2024-10-25 DIAGNOSIS — J441 Chronic obstructive pulmonary disease with (acute) exacerbation: Secondary | ICD-10-CM | POA: Diagnosis not present

## 2024-10-25 DIAGNOSIS — F17200 Nicotine dependence, unspecified, uncomplicated: Secondary | ICD-10-CM | POA: Diagnosis not present

## 2024-10-25 DIAGNOSIS — I5033 Acute on chronic diastolic (congestive) heart failure: Secondary | ICD-10-CM | POA: Diagnosis not present

## 2024-10-25 LAB — CULTURE, BLOOD (ROUTINE X 2)
Culture: NO GROWTH
Culture: NO GROWTH

## 2024-10-25 NOTE — Progress Notes (Signed)
 PULMONOLOGY         Date: 10/25/2024,   MRN# [id number] Corey House. [birth year]     AdmissionWeight: 58.8 kg                 CurrentWeight: 50.1 kg  Referring provider: Dr Josette   CHIEF COMPLAINT:   Acute exacerbation of CHF and COPD   HISTORY OF PRESENT ILLNESS   This is a 58 yo M with hx of chronic cough, COPD, CHF, Urolithiasis, tobacco dependence who came in due to worsening SOB.  [name] hypoxemic requiring HHFNC at 15L/min to reach normoxia.  Since admission he has partially improved with weaning down of O2 req.  He had CT chest with PE protocol and was found to have chronic bronchitic COPD and centrilobular emphysema with no venous thromboemoblism.  He also had noted RV dilation suggestive of RV failure.  Cardiac biomarkers were mildy elevated consisted with acute on chronic CHF. He had septic workup performed and had steroids with antibiotics prescribed. He also had DVT studies and this was negative for bilateral LE dvt.  Patient requested pulmonary evaluation and for this reason consultation was placed.   10/22/24- patient is improved, CXR repeating today. Preparing for dc home in AM. Plan to wean steroids and abx to PO regimen on outpatient basis.   10/23/24- patient is able to ambulate around hallway.  He did have very transient mild hypoxemia. He is improved quite a bit. We discussed smoking cessation.  He is concerned about his homelessness. His blood work is improved and blood gas is with chronic hypercapnic respiratory failure.   10/24/24- patient is resting in bed comfortably, reports expectoration of mucus this am and feels slightly better.  He was able to walk around hallway today. CXR is clear.  Plan to wean off oxygen and dc home when possible.  He's on zithromax  250 PO and prednisone  taper. He should use IS and flutter valve.   10/25/24- patient continues to desaturate while ambulating.  He drops his oxygen down to low 80s with slow  walking. Difficult situation with homelessness and needing oxygen therapy and possible BIPAP  PAST MEDICAL HISTORY   Past Medical History:  Diagnosis Date   Chronic cough    Urolithiasis    Urolithiasis      SURGICAL HISTORY   Past Surgical History:  Procedure Laterality Date   CATARACT EXTRACTION W/PHACO Left 08/09/2021   Procedure: CATARACT EXTRACTION PHACO AND INTRAOCULAR LENS PLACEMENT (IOC) LEFT .051 00:10.0;  Surgeon: Mittie Gaskin, MD;  Location: [facility];  Service: Ophthalmology;  Laterality: Left;   CATARACT EXTRACTION W/PHACO Right 10/04/2021   Procedure: CATARACT EXTRACTION PHACO AND INTRAOCULAR LENS PLACEMENT (IOC) RIGHT 0.67 00:13.8;  Surgeon: Mittie Gaskin, MD;  Location: [facility];  Service: Ophthalmology;  Laterality: Right;  general anesthesia needs to be last case   HERNIA REPAIR     x2   MANDIBLE FRACTURE SURGERY       FAMILY HISTORY   History reviewed. No pertinent family history.   SOCIAL HISTORY   Social History   Tobacco Use   Smoking status: Every Day    Current packs/day: 1.00    Average packs/day: 1 pack/day for 35.0 years (35.0 ttl pk-yrs)    Types: Cigarettes   Smokeless tobacco: Never   Tobacco comments:    Started smoking age 42  Vaping Use   Vaping status: Never Used  Substance Use Topics   Alcohol use:  No   Drug use: Not Currently     MEDICATIONS    Home Medication:    Current Medication:  Current Facility-Administered Medications:    acetaminophen  (TYLENOL ) tablet 650 mg, 650 mg, Oral, Q6H PRN, Josette Ade, MD, 650 mg at 10/22/24 1452   albuterol  (PROVENTIL ) (2.5 MG/3ML) 0.083% nebulizer solution 2.5 mg, 2.5 mg, Nebulization, Q2H PRN, Foust, Katy L, NP, 2.5 mg at 10/23/24 9176   ALPRAZolam (XANAX) tablet 0.25 mg, 0.25 mg, Oral, TID PRN, Josette Ade, MD, 0.25 mg at 10/24/24 2111   artificial tears ophthalmic solution 1 drop, 1 drop, Both Eyes, PRN, Foust, Katy L, NP    azithromycin  (ZITHROMAX ) tablet 250 mg, 250 mg, Oral, Daily, Khole Arterburn, MD, 250 mg at 10/24/24 0841   enoxaparin  (LOVENOX ) injection 40 mg, 40 mg, Subcutaneous, Q24H, Foust, Katy L, NP, 40 mg at 10/24/24 1818   escitalopram  (LEXAPRO ) tablet 10 mg, 10 mg, Oral, Daily, Smith, Annie B, NP, 10 mg at 10/24/24 0841   feeding supplement (ENSURE PLUS HIGH PROTEIN) liquid 237 mL, 237 mL, Oral, BID BM, Wieting, Richard, MD   furosemide (LASIX) tablet 20 mg, 20 mg, Oral, Daily, Wieting, Richard, MD, 20 mg at 10/24/24 0841   guaiFENesin  (MUCINEX ) 12 hr tablet 600 mg, 600 mg, Oral, BID, Foust, Katy L, NP, 600 mg at 10/24/24 2111   melatonin tablet 5 mg, 5 mg, Oral, QHS PRN, Foust, Katy L, NP, 5 mg at 10/24/24 2111   multivitamin with minerals tablet 1 tablet, 1 tablet, Oral, Daily, Josette Ade, MD, 1 tablet at 10/24/24 0841   nicotine  (NICODERM CQ  - dosed in mg/24 hours) patch 14 mg, 14 mg, Transdermal, Daily, Foust, Katy L, NP, 14 mg at 10/24/24 0845   nicotine  polacrilex (NICORETTE) gum 2 mg, 2 mg, Oral, PRN, Foust, Katy L, NP   pantoprazole (PROTONIX) EC tablet 40 mg, 40 mg, Oral, Daily, Foust, Katy L, NP, 40 mg at 10/24/24 0841   [COMPLETED] predniSONE  (DELTASONE ) tablet 50 mg, 50 mg, Oral, Q breakfast, 50 mg at 10/24/24 0841 **FOLLOWED BY** predniSONE  (DELTASONE ) tablet 45 mg, 45 mg, Oral, Q breakfast **FOLLOWED BY** [START ON 10/26/2024] predniSONE  (DELTASONE ) tablet 40 mg, 40 mg, Oral, Q breakfast **FOLLOWED BY** [START ON 10/27/2024] predniSONE  (DELTASONE ) tablet 35 mg, 35 mg, Oral, Q breakfast **FOLLOWED BY** [START ON 10/28/2024] predniSONE  (DELTASONE ) tablet 30 mg, 30 mg, Oral, Q breakfast **FOLLOWED BY** [START ON 10/29/2024] predniSONE  (DELTASONE ) tablet 25 mg, 25 mg, Oral, Q breakfast **FOLLOWED BY** [START ON 10/30/2024] predniSONE  (DELTASONE ) tablet 20 mg, 20 mg, Oral, Q breakfast **FOLLOWED BY** [START ON 10/31/2024] predniSONE  (DELTASONE ) tablet 15 mg, 15 mg, Oral, Q breakfast **FOLLOWED  BY** [START ON 11/01/2024] predniSONE  (DELTASONE ) tablet 10 mg, 10 mg, Oral, Q breakfast **FOLLOWED BY** [START ON 11/02/2024] predniSONE  (DELTASONE ) tablet 5 mg, 5 mg, Oral, Q breakfast, Neila Teem, MD   QUEtiapine  (SEROQUEL ) tablet 25 mg, 25 mg, Oral, QHS, Smith, Annie B, NP, 25 mg at 10/24/24 2111    ALLERGIES   Patient has no known allergies.     REVIEW OF SYSTEMS    Review of Systems:  Gen:  Denies  fever, sweats, chills weigh loss  HEENT: Denies blurred vision, double vision, ear pain, eye pain, hearing loss, nose bleeds, sore throat Cardiac:  No dizziness, chest pain or heaviness, chest tightness,edema Resp:   reports dyspnea chronically  Gi: Denies swallowing difficulty, stomach pain, nausea or vomiting, diarrhea, constipation, bowel incontinence Gu:  Denies bladder incontinence, burning urine Ext:   Denies Joint pain,  stiffness or swelling Skin: Denies  skin rash, easy bruising or bleeding or hives Endoc:  Denies polyuria, polydipsia , polyphagia or weight change Psych:   Denies depression, insomnia or hallucinations   Other:  All other systems negative   VS: BP 117/86 (BP Location: Left Arm)   Pulse 79   Temp 98 F (36.7 C)   Resp 16   Ht 5' 4 (1.626 m)   Wt 50.1 kg   SpO2 96%   BMI 18.96 kg/m      PHYSICAL EXAM    GENERAL:NAD, no fevers, chills, no weakness no fatigue HEAD: Normocephalic, atraumatic.  EYES: Pupils equal, round, reactive to light. Extraocular muscles intact. No scleral icterus.  MOUTH: Moist mucosal membrane. Dentition intact. No abscess noted.  EAR, NOSE, THROAT: Clear without exudates. No external lesions.  NECK: Supple. No thyromegaly. No nodules. No JVD.  PULMONARY: decreased breath sounds with mild rhonchi worse at bases bilaterally.  CARDIOVASCULAR: S1 and S2. Regular rate and rhythm. No murmurs, rubs, or gallops. No edema. Pedal pulses 2+ bilaterally.  GASTROINTESTINAL: Soft, nontender, nondistended. No masses. Positive  bowel sounds. No hepatosplenomegaly.  MUSCULOSKELETAL: No swelling, clubbing, or edema. Range of motion full in all extremities.  NEUROLOGIC: Cranial nerves II through XII are intact. No gross focal neurological deficits. Sensation intact. Reflexes intact.  SKIN: No ulceration, lesions, rashes, or cyanosis. Skin warm and dry. Turgor intact.  PSYCHIATRIC: Mood, affect within normal limits. The patient is awake, alert and oriented x 3. Insight, judgment intact.       IMAGING     rative & Impression  EXAM: CTA of the Chest with contrast for PE 10/20/2024 02:19:01 PM   TECHNIQUE: CTA of the chest was performed without and with the administration of 75 mL of iohexol  (OMNIPAQUE ) 350 MG/ML injection. Multiplanar reformatted images are provided for review. MIP images are provided for review. Automated exposure control, iterative reconstruction, and/or weight based adjustment of the mA/kV was utilized to reduce the radiation dose to as low as reasonably achievable.   COMPARISON: Same day x-ray and CTA chest 01/04/2023.   CLINICAL HISTORY: Pulmonary embolism (PE) suspected, high prob.   FINDINGS:   PULMONARY ARTERIES: Pulmonary arteries are adequately opacified for evaluation. Negative for pulmonary embolism. Main pulmonary artery is normal in caliber.   MEDIASTINUM: Dilated right ventricle with reflux of contrast into the IVC and hepatic veins compatible with elevated right heart pressures. There is no acute abnormality of the thoracic aorta.   LYMPH NODES: No mediastinal, hilar or axillary lymphadenopathy.   LUNGS AND PLEURA: Diffuse bronchial wall thickening. Mucous plugging in the lower lobes. Emphysema. No focal consolidation or pulmonary edema. No pleural effusion or pneumothorax.   UPPER ABDOMEN: Limited images of the upper abdomen are unremarkable.   SOFT TISSUES AND BONES: No acute bone or soft tissue abnormality.   IMPRESSION: 1. No pulmonary embolism. 2.  Dilated right ventricle with reflux of contrast into the IVC and hepatic veins, compatible with elevated right heart pressures. 3. Chronic bronchitis. 4. Emphysema. Given patient age, consider evaluation for low-dose CT lung cancer screening.   Electronically signed by: Norman Gatlin MD 10/20/2024 02:49 PM EST RP Workstation: [id number]      ASSESSMENT/PLAN     Acute exacerbation of COPD- RESOLVED   - continue nebulizer therapy Duoneb 16h prn   - s/p solumedrol 125x1 followed by 40mg  IV daily -wean to pred  -adding zithromax  PO - PT/OT -Incentive spirometry and flutter valve    Mucus plugging of tracheobronchial  tree-RESOLVED     - considering outpatient bronchoscopy    Tobacco dependence   - 14mg  transdermal patch     Enlarged Right ventricle Suggestive of pulmonary hypertension - would recommend RHC via cardiology service when possible non urgently    Psychiatric history   Patient is currently being evaluated by psychiatrist     Chronic hypoxemia    - patient continues to desaturate while ambulating slowly around hallway.  Unable to dc on oxygen due to homelessness           Thank you for allowing me to participate in the care of this patient.   Patient/Family are satisfied with care plan and all questions have been answered.    Provider disclosure: Patient with at least one acute or chronic illness or injury that poses a threat to life or bodily function and is being managed actively during this encounter.  All of the below services have been performed independently by signing provider:  review of prior documentation from internal and or external health records.  Review of previous and current lab results.  Interview and comprehensive assessment during patient visit today. Review of current and previous chest radiographs/CT scans. Discussion of management and test interpretation with health care team and patient/family.   This document was prepared using  Dragon voice recognition software and may include unintentional dictation errors.     Junius Faucett, M.D.  Division of Pulmonary & Critical Care Medicine

## 2024-10-25 NOTE — Progress Notes (Signed)
 Progress Note   Patient: Corey House. FMW:[id number] DOB: [birth year] DOA: 10/20/2024     5 DOS: the patient was seen and examined on 10/25/2024   Brief hospital course: 58 y.o. year old male with past medical history of  tobacco use disorder reporting a 41 year pack per year history, smoking ~1 pack per day since he was 58 years old. He denies any additional medical history though he has not seen a medical provider in ~20 years. He presents to [facility] ED with shortness of breath that began ~ 9 months ago and worsened ~3 months ago. He reports today his dyspnea is not significantly different than it was 3 months ago. He endorses a chronic cough that has been present since childhood. Her ports the cough is currently, minimally productive with white phlegm. Additionally, he endorses bilateral lower extremity swelling that began in the last 2 days, per his report (R) > (L). On exam the swelling is equal bilaterally. He endorses fatigue and dyspnea that is worse with position changes such as bending over and when standing from lying. He denies chest pain, palpitations, or fever.   ED Course: On arrival to [facility] ED patient was noted to be afebrile temp 36.8C, BP 142/111, HR 88, RR 22, SPO2 100% on 15L non-rebreather. Patient now on 2L via nasal cannula with SPO2 96%. CXR obtained and shows non specific findings of increased interstitial markings. CTA PE obtained and shows no PE, dilated RV and reflux of contract into the IVC and hepatic vein consistent with (R) heart failure, chronic bronchitis, and emphysema. US  DVT (B)LE negative for DVT. Labs notable Negative COVID, flu, RSV,for BNP 284.2, BUN 24, troponin 41--> 38 with no report of chest pain.  Lactic acid 0.9.  Blood cultures drawn and in process.  He was given Lasix, DuoNebs, and Solu-Medrol .  TRH contacted for admission.  11/5.  Pulse ox with ambulation down to 85%.  Patient admitted with COPD and CHF exacerbation.  Patient  also states that he is short of breath and he is waiting to die.  Appreciate psychiatric consultation.  Patient is homeless. 11/6.  Patient states that he has been sleepy.  Venous blood gas shows a pCO2 of 79 with a pH of 7.41.  Will start BiPAP at night.  Try to see if we can taper off oxygen. 11/7.  Patient hypoxic with walking around with pulmonary to 87%. 11/8.  Patient's pulse ox dropped down to 84% with ambulation. 11/9.  Patient had a pulse ox of 78% on room air at rest but he stated he walked back from the bathroom.  Assessment and Plan: * Acute respiratory failure with hypoxia and hypercapnia (HCC) Pulse ox dropped to 78% at rest today but just walked to the bathroom.  The patient is homeless will need to be able to come off oxygen prior to disposition.  Continue to try to see if we can get off oxygen on a daily basis.  VBG shows a pCO2 of 81.  Patient unable to tolerate BiPAP.  The patient likely has acute on chronic respiratory failure.  Will still try to see if we can get off oxygen on a daily basis.  COPD with acute exacerbation (HCC) Patient on Zithromax , on long prednisone  taper and nebulizer treatments.  Acute on chronic diastolic CHF (congestive heart failure) (HCC) EF 55 to 60%.  Initially on IV Lasix.  On 11/6 and gave a dose of acetazolamide with CO2 elevation on chemistry.  Will continue  low-dose p.o Lasix.  CT scan commenting on elevated right heart pressures.  Tobacco use disorder Must stop smoking.  Headache Improved.  Tylenol  as needed.  IV magnesium  given on 11/5.  Increased anion gap metabolic acidosis Resolved  Hypernatremia Resolved  Depression Patient on Lexapro  and Seroquel  at night  Protein-calorie malnutrition, severe Continue supplements        Subjective: Patient still feels tired and short of breath.  Admitted with COPD and CHF exacerbation.  Pulse ox of 78% at rest off oxygen today but patient just walked back from the bathroom.  Physical  Exam: Vitals:   10/25/24 0500 10/25/24 0824 10/25/24 1025 10/25/24 1030  BP:  117/86    Pulse:  79    Resp:  16    Temp:  98 F (36.7 C)    TempSrc:      SpO2:  96% (!) 78% 90%  Weight: 50.1 kg     Height:       Physical Exam HENT:     Head: Normocephalic.     Mouth/Throat:     Pharynx: No oropharyngeal exudate.  Eyes:     General: Lids are normal.     Conjunctiva/sclera: Conjunctivae normal.  Cardiovascular:     Rate and Rhythm: Normal rate and regular rhythm.     Heart sounds: Normal heart sounds, S1 normal and S2 normal.  Pulmonary:     Breath sounds: Examination of the right-lower field reveals decreased breath sounds. Examination of the left-lower field reveals decreased breath sounds. Decreased breath sounds present. No wheezing, rhonchi or rales.  Abdominal:     Palpations: Abdomen is soft.     Tenderness: There is no abdominal tenderness.  Musculoskeletal:     Right lower leg: No swelling.     Left lower leg: No swelling.  Skin:    General: Skin is warm.     Findings: No rash.  Neurological:     Mental Status: He is alert and oriented to person, place, and time.     Data Reviewed: No new data  Disposition: Status is: Inpatient Remains inpatient appropriate because: Patient pulse ox 78% at rest but he did walk to the bathroom just before they tested.  Since the patient is homeless will have to set up some sort of living condition for him to go home with oxygen.  Planned Discharge Destination: Will need placement    Time spent: 28 minutes  Author: Charlie Patterson, MD 10/25/2024 11:25 AM  For on call review [email or web address].

## 2024-10-25 NOTE — Plan of Care (Signed)

## 2024-10-25 NOTE — Plan of Care (Signed)
°  Problem: Clinical Measurements: Goal: Ability to maintain clinical measurements within normal limits will improve Outcome: Not Progressing Goal: Diagnostic test results will improve Outcome: Not Progressing Goal: Respiratory complications will improve Outcome: Not Progressing   Problem: Activity: Goal: Risk for activity intolerance will decrease Outcome: Not Progressing

## 2024-10-26 DIAGNOSIS — J9601 Acute respiratory failure with hypoxia: Secondary | ICD-10-CM | POA: Diagnosis not present

## 2024-10-26 DIAGNOSIS — J441 Chronic obstructive pulmonary disease with (acute) exacerbation: Secondary | ICD-10-CM | POA: Diagnosis not present

## 2024-10-26 DIAGNOSIS — I5033 Acute on chronic diastolic (congestive) heart failure: Secondary | ICD-10-CM | POA: Diagnosis not present

## 2024-10-26 DIAGNOSIS — F17200 Nicotine dependence, unspecified, uncomplicated: Secondary | ICD-10-CM | POA: Diagnosis not present

## 2024-10-26 LAB — BASIC METABOLIC PANEL WITH GFR
Anion gap: 10 (ref 5–15)
BUN: 27 mg/dL — ABNORMAL HIGH (ref 6–20)
CO2: 38 mmol/L — ABNORMAL HIGH (ref 22–32)
Calcium: 8.7 mg/dL — ABNORMAL LOW (ref 8.9–10.3)
Chloride: 93 mmol/L — ABNORMAL LOW (ref 98–111)
Creatinine, Ser: 0.57 mg/dL — ABNORMAL LOW (ref 0.61–1.24)
GFR, Estimated: >60 mL/min (ref >60)
Glucose, Bld: 94 mg/dL (ref 70–99)
Potassium: 4.6 mmol/L (ref 3.5–5.1)
Sodium: 141 mmol/L (ref 135–145)

## 2024-10-26 LAB — CBC
HCT: 48.7 % (ref 39.0–52.0)
Hemoglobin: 15.2 g/dL (ref 13.0–17.0)
MCH: 29.8 pg (ref 26.0–34.0)
MCHC: 31.2 g/dL (ref 30.0–36.0)
MCV: 95.5 fL (ref 80.0–100.0)
Platelets: 287 K/uL (ref 150–400)
RBC: 5.1 MIL/uL (ref 4.22–5.81)
RDW: 13.6 % (ref 11.5–15.5)
WBC: 5.7 K/uL (ref 4.0–10.5)
nRBC: 0 % (ref 0.0–0.2)

## 2024-10-26 MED ORDER — ORAL CARE MOUTH RINSE
15.0000 mL | OROMUCOSAL | Status: AC | PRN
Start: 2024-10-26 — End: ?

## 2024-10-26 NOTE — Progress Notes (Signed)
 PULMONOLOGY         Date: 10/26/2024,   MRN# [id number] Corey House. Aug 19, 1966     AdmissionWeight: 58.8 kg                 CurrentWeight: 52.6 kg  Referring provider: Dr Josette   CHIEF COMPLAINT:   Acute exacerbation of CHF and COPD   HISTORY OF PRESENT ILLNESS   This is a 58 yo M with hx of chronic cough, COPD, CHF, Urolithiasis, tobacco dependence who came in due to worsening SOB.  [name] hypoxemic requiring HHFNC at 15L/min to reach normoxia.  Since admission he has partially improved with weaning down of O2 req.  He had CT chest with PE protocol and was found to have chronic bronchitic COPD and centrilobular emphysema with no venous thromboemoblism.  He also had noted RV dilation suggestive of RV failure.  Cardiac biomarkers were mildy elevated consisted with acute on chronic CHF. He had septic workup performed and had steroids with antibiotics prescribed. He also had DVT studies and this was negative for bilateral LE dvt.  Patient requested pulmonary evaluation and for this reason consultation was placed.   10/22/24- patient is improved, CXR repeating today. Preparing for dc home in AM. Plan to wean steroids and abx to PO regimen on outpatient basis.   10/23/24- patient is able to ambulate around hallway.  He did have very transient mild hypoxemia. He is improved quite a bit. We discussed smoking cessation.  He is concerned about his homelessness. His blood work is improved and blood gas is with chronic hypercapnic respiratory failure.   10/24/24- patient is resting in bed comfortably, reports expectoration of mucus this am and feels slightly better.  He was able to walk around hallway today. CXR is clear.  Plan to wean off oxygen and dc home when possible.  He's on zithromax  250 PO and prednisone  taper. He should use IS and flutter valve.   10/25/24- patient continues to desaturate while ambulating.  He drops his oxygen down to low 80s with slow  walking. Difficult situation with homelessness and needing oxygen therapy and possible BIPAP  10/26/24- patient reports feeling week and dyspneic in bed. He is working on obtaining placement.  He is with advanced COPD.   PAST MEDICAL HISTORY   Past Medical History:  Diagnosis Date   Chronic cough    Urolithiasis    Urolithiasis      SURGICAL HISTORY   Past Surgical History:  Procedure Laterality Date   CATARACT EXTRACTION W/PHACO Left 08/09/2021   Procedure: CATARACT EXTRACTION PHACO AND INTRAOCULAR LENS PLACEMENT (IOC) LEFT .051 00:10.0;  Surgeon: Mittie Gaskin, MD;  Location: [facility];  Service: Ophthalmology;  Laterality: Left;   CATARACT EXTRACTION W/PHACO Right 10/04/2021   Procedure: CATARACT EXTRACTION PHACO AND INTRAOCULAR LENS PLACEMENT (IOC) RIGHT 0.67 00:13.8;  Surgeon: Mittie Gaskin, MD;  Location: [facility];  Service: Ophthalmology;  Laterality: Right;  general anesthesia needs to be last case   HERNIA REPAIR     x2   MANDIBLE FRACTURE SURGERY       FAMILY HISTORY   History reviewed. No pertinent family history.   SOCIAL HISTORY   Social History   Tobacco Use   Smoking status: Every Day    Current packs/day: 1.00    Average packs/day: 1 pack/day for 35.0 years (35.0 ttl pk-yrs)    Types: Cigarettes   Smokeless tobacco: Never   Tobacco comments:  Started smoking age 30  Vaping Use   Vaping status: Never Used  Substance Use Topics   Alcohol use: No   Drug use: Not Currently     MEDICATIONS    Home Medication:    Current Medication:  Current Facility-Administered Medications:    acetaminophen  (TYLENOL ) tablet 650 mg, 650 mg, Oral, Q6H PRN, Josette Ade, MD, 650 mg at 10/26/24 0944   albuterol  (PROVENTIL ) (2.5 MG/3ML) 0.083% nebulizer solution 2.5 mg, 2.5 mg, Nebulization, Q2H PRN, Foust, Katy L, NP, 2.5 mg at 10/23/24 9176   ALPRAZolam (XANAX) tablet 0.25 mg, 0.25 mg, Oral, TID PRN, Josette Ade, MD,  0.25 mg at 10/26/24 9047   artificial tears ophthalmic solution 1 drop, 1 drop, Both Eyes, PRN, Foust, Katy L, NP   azithromycin  (ZITHROMAX ) tablet 250 mg, 250 mg, Oral, Daily, Onika Gudiel, MD, 250 mg at 10/26/24 0944   enoxaparin  (LOVENOX ) injection 40 mg, 40 mg, Subcutaneous, Q24H, Foust, Katy L, NP, 40 mg at 10/25/24 1752   escitalopram  (LEXAPRO ) tablet 10 mg, 10 mg, Oral, Daily, Claudene Sham B, NP, 10 mg at 10/26/24 0945   feeding supplement (ENSURE PLUS HIGH PROTEIN) liquid 237 mL, 237 mL, Oral, BID BM, Wieting, Richard, MD   guaiFENesin  (MUCINEX ) 12 hr tablet 600 mg, 600 mg, Oral, BID, Foust, Katy L, NP, 600 mg at 10/26/24 0945   melatonin tablet 5 mg, 5 mg, Oral, QHS PRN, Foust, Katy L, NP, 5 mg at 10/24/24 2111   multivitamin with minerals tablet 1 tablet, 1 tablet, Oral, Daily, Josette Ade, MD, 1 tablet at 10/26/24 0944   nicotine  (NICODERM CQ  - dosed in mg/24 hours) patch 14 mg, 14 mg, Transdermal, Daily, Foust, Katy L, NP, 14 mg at 10/26/24 0944   nicotine  polacrilex (NICORETTE) gum 2 mg, 2 mg, Oral, PRN, Foust, Katy L, NP   pantoprazole (PROTONIX) EC tablet 40 mg, 40 mg, Oral, Daily, Foust, Katy L, NP, 40 mg at 10/26/24 0945   [COMPLETED] predniSONE  (DELTASONE ) tablet 50 mg, 50 mg, Oral, Q breakfast, 50 mg at 10/24/24 0841 **FOLLOWED BY** [COMPLETED] predniSONE  (DELTASONE ) tablet 45 mg, 45 mg, Oral, Q breakfast, 45 mg at 10/25/24 0930 **FOLLOWED BY** [COMPLETED] predniSONE  (DELTASONE ) tablet 40 mg, 40 mg, Oral, Q breakfast, 40 mg at 10/26/24 0945 **FOLLOWED BY** [START ON 10/27/2024] predniSONE  (DELTASONE ) tablet 35 mg, 35 mg, Oral, Q breakfast **FOLLOWED BY** [START ON 10/28/2024] predniSONE  (DELTASONE ) tablet 30 mg, 30 mg, Oral, Q breakfast **FOLLOWED BY** [START ON 10/29/2024] predniSONE  (DELTASONE ) tablet 25 mg, 25 mg, Oral, Q breakfast **FOLLOWED BY** [START ON 10/30/2024] predniSONE  (DELTASONE ) tablet 20 mg, 20 mg, Oral, Q breakfast **FOLLOWED BY** [START ON 10/31/2024]  predniSONE  (DELTASONE ) tablet 15 mg, 15 mg, Oral, Q breakfast **FOLLOWED BY** [START ON 11/01/2024] predniSONE  (DELTASONE ) tablet 10 mg, 10 mg, Oral, Q breakfast **FOLLOWED BY** [START ON 11/02/2024] predniSONE  (DELTASONE ) tablet 5 mg, 5 mg, Oral, Q breakfast, Whitni Pasquini, MD   QUEtiapine  (SEROQUEL ) tablet 25 mg, 25 mg, Oral, QHS, Smith, Annie B, NP, 25 mg at 10/25/24 2129    ALLERGIES   Patient has no known allergies.     REVIEW OF SYSTEMS    Review of Systems:  Gen:  Denies  fever, sweats, chills weigh loss  HEENT: Denies blurred vision, double vision, ear pain, eye pain, hearing loss, nose bleeds, sore throat Cardiac:  No dizziness, chest pain or heaviness, chest tightness,edema Resp:   reports dyspnea chronically  Gi: Denies swallowing difficulty, stomach pain, nausea or vomiting, diarrhea, constipation, bowel incontinence Gu:  Denies bladder incontinence, burning urine Ext:   Denies Joint pain, stiffness or swelling Skin: Denies  skin rash, easy bruising or bleeding or hives Endoc:  Denies polyuria, polydipsia , polyphagia or weight change Psych:   Denies depression, insomnia or hallucinations   Other:  All other systems negative   VS: BP 128/88   Pulse 86   Temp 98.2 F (36.8 C) (Oral)   Resp 16   Ht 5' 4 (1.626 m)   Wt 52.6 kg   SpO2 96%   BMI 19.90 kg/m      PHYSICAL EXAM    GENERAL:NAD, no fevers, chills, no weakness no fatigue HEAD: Normocephalic, atraumatic.  EYES: Pupils equal, round, reactive to light. Extraocular muscles intact. No scleral icterus.  MOUTH: Moist mucosal membrane. Dentition intact. No abscess noted.  EAR, NOSE, THROAT: Clear without exudates. No external lesions.  NECK: Supple. No thyromegaly. No nodules. No JVD.  PULMONARY: decreased breath sounds with mild rhonchi worse at bases bilaterally.  CARDIOVASCULAR: S1 and S2. Regular rate and rhythm. No murmurs, rubs, or gallops. No edema. Pedal pulses 2+ bilaterally.   GASTROINTESTINAL: Soft, nontender, nondistended. No masses. Positive bowel sounds. No hepatosplenomegaly.  MUSCULOSKELETAL: No swelling, clubbing, or edema. Range of motion full in all extremities.  NEUROLOGIC: Cranial nerves II through XII are intact. No gross focal neurological deficits. Sensation intact. Reflexes intact.  SKIN: No ulceration, lesions, rashes, or cyanosis. Skin warm and dry. Turgor intact.  PSYCHIATRIC: Mood, affect within normal limits. The patient is awake, alert and oriented x 3. Insight, judgment intact.       IMAGING     rative & Impression  EXAM: CTA of the Chest with contrast for PE 10/20/2024 02:19:01 PM   TECHNIQUE: CTA of the chest was performed without and with the administration of 75 mL of iohexol  (OMNIPAQUE ) 350 MG/ML injection. Multiplanar reformatted images are provided for review. MIP images are provided for review. Automated exposure control, iterative reconstruction, and/or weight based adjustment of the mA/kV was utilized to reduce the radiation dose to as low as reasonably achievable.   COMPARISON: Same day x-ray and CTA chest 01/04/2023.   CLINICAL HISTORY: Pulmonary embolism (PE) suspected, high prob.   FINDINGS:   PULMONARY ARTERIES: Pulmonary arteries are adequately opacified for evaluation. Negative for pulmonary embolism. Main pulmonary artery is normal in caliber.   MEDIASTINUM: Dilated right ventricle with reflux of contrast into the IVC and hepatic veins compatible with elevated right heart pressures. There is no acute abnormality of the thoracic aorta.   LYMPH NODES: No mediastinal, hilar or axillary lymphadenopathy.   LUNGS AND PLEURA: Diffuse bronchial wall thickening. Mucous plugging in the lower lobes. Emphysema. No focal consolidation or pulmonary edema. No pleural effusion or pneumothorax.   UPPER ABDOMEN: Limited images of the upper abdomen are unremarkable.   SOFT TISSUES AND BONES: No acute bone or soft  tissue abnormality.   IMPRESSION: 1. No pulmonary embolism. 2. Dilated right ventricle with reflux of contrast into the IVC and hepatic veins, compatible with elevated right heart pressures. 3. Chronic bronchitis. 4. Emphysema. Given patient age, consider evaluation for low-dose CT lung cancer screening.   Electronically signed by: Norman Gatlin MD 10/20/2024 02:49 PM EST RP Workstation: [id number]      ASSESSMENT/PLAN     Acute exacerbation of COPD- RESOLVED   - continue nebulizer therapy Duoneb 16h prn   - s/p solumedrol 125x1 followed by 40mg  IV daily -wean to pred  -adding zithromax  PO - PT/OT -Incentive spirometry and flutter  valve    Mucus plugging of tracheobronchial tree-RESOLVED     - considering outpatient bronchoscopy    Tobacco dependence   - 14mg  transdermal patch     Enlarged Right ventricle Suggestive of pulmonary hypertension - would recommend RHC via cardiology service when possible non urgently    Psychiatric history   Patient is currently being evaluated by psychiatrist     Chronic hypoxemia    - patient continues to desaturate while ambulating slowly around hallway.  Unable to dc on oxygen due to homelessness           Thank you for allowing me to participate in the care of this patient.   Patient/Family are satisfied with care plan and all questions have been answered.    Provider disclosure: Patient with at least one acute or chronic illness or injury that poses a threat to life or bodily function and is being managed actively during this encounter.  All of the below services have been performed independently by signing provider:  review of prior documentation from internal and or external health records.  Review of previous and current lab results.  Interview and comprehensive assessment during patient visit today. Review of current and previous chest radiographs/CT scans. Discussion of management and test interpretation with health  care team and patient/family.   This document was prepared using Dragon voice recognition software and may include unintentional dictation errors.     Rodolfo Notaro, M.D.  Division of Pulmonary & Critical Care Medicine

## 2024-10-26 NOTE — Progress Notes (Signed)
 Progress Note   Patient: Corey House. FMW:[id number] DOB: 1966/01/23 DOA: 10/20/2024     6 DOS: the patient was seen and examined on 10/26/2024   Brief hospital course: 58 y.o. year old male with past medical history of  tobacco use disorder reporting a 41 year pack per year history, smoking ~1 pack per day since he was 58 years old. He denies any additional medical history though he has not seen a medical provider in ~20 years. He presents to [facility] ED with shortness of breath that began ~ 9 months ago and worsened ~3 months ago. He reports today his dyspnea is not significantly different than it was 3 months ago. He endorses a chronic cough that has been present since childhood. Her ports the cough is currently, minimally productive with white phlegm. Additionally, he endorses bilateral lower extremity swelling that began in the last 2 days, per his report (R) > (L). On exam the swelling is equal bilaterally. He endorses fatigue and dyspnea that is worse with position changes such as bending over and when standing from lying. He denies chest pain, palpitations, or fever.   ED Course: On arrival to [facility] ED patient was noted to be afebrile temp 36.8C, BP 142/111, HR 88, RR 22, SPO2 100% on 15L non-rebreather. Patient now on 2L via nasal cannula with SPO2 96%. CXR obtained and shows non specific findings of increased interstitial markings. CTA PE obtained and shows no PE, dilated RV and reflux of contract into the IVC and hepatic vein consistent with (R) heart failure, chronic bronchitis, and emphysema. US  DVT (B)LE negative for DVT. Labs notable Negative COVID, flu, RSV,for BNP 284.2, BUN 24, troponin 41--> 38 with no report of chest pain.  Lactic acid 0.9.  Blood cultures drawn and in process.  He was given Lasix, DuoNebs, and Solu-Medrol .  TRH contacted for admission.  11/5.  Pulse ox with ambulation down to 85%.  Patient admitted with COPD and CHF exacerbation.  Patient  also states that he is short of breath and he is waiting to die.  Appreciate psychiatric consultation.  Patient is homeless. 11/6.  Patient states that he has been sleepy.  Venous blood gas shows a pCO2 of 79 with a pH of 7.41.  Will start BiPAP at night.  Try to see if we can taper off oxygen. 11/7.  Patient hypoxic with walking around with pulmonary to 87%. 11/8.  Patient's pulse ox dropped down to 84% with ambulation. 11/9.  Patient had a pulse ox of 78% on room air at rest but he stated he walked back from the bathroom. 11/10.  Pulse ox in the low 80s with ambulation.  Likely will need chronic oxygen.  Assessment and Plan: * Acute respiratory failure with hypoxia and hypercapnia (HCC) Pulse ox dropped to 82% with ambulation.  The patient is homeless will need to be able to come off oxygen prior to disposition.  Continue to try to see if we can get off oxygen on a daily basis.  VBG shows a pCO2 of 81.  Patient unable to tolerate BiPAP.  The patient likely has acute on chronic respiratory failure.  Unable to come off oxygen today.  COPD with acute exacerbation (HCC) Patient on Zithromax , on long prednisone  taper and nebulizer treatments.  Acute on chronic diastolic CHF (congestive heart failure) (HCC) EF 55 to 60%.  Initially on IV Lasix.  On 11/6 and gave a dose of acetazolamide with CO2 elevation on chemistry.  Hold Lasix  today with CO2 on chemistry up at 38.  CT scan commenting on elevated right heart pressures.  Tobacco use disorder Must stop smoking.  Headache Improved.  Tylenol  as needed.  IV magnesium  given on 11/5.  Increased anion gap metabolic acidosis Resolved  Hypernatremia Resolved  Depression Patient on Lexapro  and Seroquel  at night  Protein-calorie malnutrition, severe Continue supplements        Subjective: Patient feels a little tired and a little short of breath with walking.  With walking today, pulse ox in the low 80s.  Patient admitted with COPD  exacerbation.  Physical Exam: Vitals:   10/25/24 2043 10/26/24 0500 10/26/24 0516 10/26/24 0829  BP: 118/83  128/81 128/88  Pulse: 76  68 86  Resp: 16  16   Temp: 98.3 F (36.8 C)  97.7 F (36.5 C) 98.2 F (36.8 C)  TempSrc: Oral  Oral Oral  SpO2: 95%  100% 96%  Weight:  50.1 kg 52.6 kg   Height:       Physical Exam HENT:     Head: Normocephalic.     Mouth/Throat:     Pharynx: No oropharyngeal exudate.  Eyes:     General: Lids are normal.     Conjunctiva/sclera: Conjunctivae normal.  Cardiovascular:     Rate and Rhythm: Normal rate and regular rhythm.     Heart sounds: Normal heart sounds, S1 normal and S2 normal.  Pulmonary:     Breath sounds: Examination of the right-lower field reveals decreased breath sounds. Examination of the left-lower field reveals decreased breath sounds. Decreased breath sounds present. No wheezing, rhonchi or rales.  Abdominal:     Palpations: Abdomen is soft.     Tenderness: There is no abdominal tenderness.  Musculoskeletal:     Right lower leg: No swelling.     Left lower leg: No swelling.  Skin:    General: Skin is warm.     Findings: No rash.  Neurological:     Mental Status: He is alert and oriented to person, place, and time.     Data Reviewed: CO2 38, creatinine 0.57   Disposition: Status is: Inpatient Remains inpatient appropriate because: Unable to get off oxygen again today.  Likely will need chronic oxygen.  Unable to discharge because patient is homeless  Planned Discharge Destination: Will need some sort of placement    Time spent: 27 minutes  Author: Charlie Patterson, MD 10/26/2024 12:53 PM  For on call review [email or web address].

## 2024-10-26 NOTE — Progress Notes (Signed)
 Rounded on patient. Patient states he doesn't feel well today and that it's his breathing.  Patient provided with IS and educated on how to use it. Patient verbalized understanding and was able to demonstrate back correct usage. Bedside nurse made aware.  Patient still oxygen dependent, unable to be discharged back into the community with oxygen needs.  Encouraged to call with questions or concerns.

## 2024-10-26 NOTE — Plan of Care (Signed)

## 2024-10-26 NOTE — Plan of Care (Signed)
  Problem: Health Behavior/Discharge Planning: Goal: Ability to manage health-related needs will improve Outcome: Progressing   Problem: Activity: Goal: Risk for activity intolerance will decrease Outcome: Progressing   Problem: Nutrition: Goal: Adequate nutrition will be maintained Outcome: Progressing   Problem: Elimination: Goal: Will not experience complications related to urinary retention Outcome: Progressing   Problem: Pain Managment: Goal: General experience of comfort will improve and/or be controlled Outcome: Progressing   Problem: Safety: Goal: Ability to remain free from injury will improve Outcome: Progressing

## 2024-10-27 DIAGNOSIS — J441 Chronic obstructive pulmonary disease with (acute) exacerbation: Secondary | ICD-10-CM | POA: Diagnosis not present

## 2024-10-27 DIAGNOSIS — I5033 Acute on chronic diastolic (congestive) heart failure: Secondary | ICD-10-CM | POA: Diagnosis not present

## 2024-10-27 DIAGNOSIS — J9601 Acute respiratory failure with hypoxia: Secondary | ICD-10-CM | POA: Diagnosis not present

## 2024-10-27 DIAGNOSIS — F17200 Nicotine dependence, unspecified, uncomplicated: Secondary | ICD-10-CM | POA: Diagnosis not present

## 2024-10-27 MED ORDER — ALPRAZOLAM 0.25 MG PO TABS
0.2500 mg | ORAL_TABLET | Freq: Once | ORAL | Status: AC
  Administered 2024-10-27: 0.25 mg via ORAL
  Filled 2024-10-27: qty 1

## 2024-10-27 NOTE — Plan of Care (Incomplete)
 ?  Problem: Clinical Measurements: ?Goal: Ability to maintain clinical measurements within normal limits will improve ?Outcome: Progressing ?Goal: Will remain free from infection ?Outcome: Progressing ?Goal: Diagnostic test results will improve ?Outcome: Progressing ?  ?

## 2024-10-27 NOTE — Progress Notes (Signed)
 SATURATION QUALIFICATIONS: (This note is used to comply with regulatory documentation for home oxygen)  Patient Saturations on Room Air at Rest = 91%  Patient Saturations on Room Air while Ambulating = 84%  Patient Saturations on 3 Liters of oxygen while Ambulating = 94%

## 2024-10-27 NOTE — Progress Notes (Signed)
 Patient reports increased anxiety.  I feel like I'm going to jump out of my skin.  The dread is getting to me.  Dr Josette notified of increased anxiety, one time dose of xanax administered.

## 2024-10-27 NOTE — Progress Notes (Signed)
 PT Cancellation Note  Patient Details Name: Corey House. MRN: [id number] DOB: September 12, 1966   Cancelled Treatment:     Pt politely declined stating he was still fatigued after ambulating with nursing earlier. Will re-attempt next available date/time per POC.    Darice JAYSON Bohr 10/27/2024, 2:54 PM

## 2024-10-27 NOTE — Progress Notes (Signed)
 Progress Note   Patient: Corey House. FMW:[id number] DOB: 08/23/1966 DOA: 10/20/2024     7 DOS: the patient was seen and examined on 10/27/2024   Brief hospital course: 58 y.o. year old male with past medical history of  tobacco use disorder reporting a 41 year pack per year history, smoking ~1 pack per day since he was 58 years old. He denies any additional medical history though he has not seen a medical provider in ~20 years. He presents to [facility] ED with shortness of breath that began ~ 9 months ago and worsened ~3 months ago. He reports today his dyspnea is not significantly different than it was 3 months ago. He endorses a chronic cough that has been present since childhood. Her ports the cough is currently, minimally productive with white phlegm. Additionally, he endorses bilateral lower extremity swelling that began in the last 2 days, per his report (R) > (L). On exam the swelling is equal bilaterally. He endorses fatigue and dyspnea that is worse with position changes such as bending over and when standing from lying. He denies chest pain, palpitations, or fever.   ED Course: On arrival to [facility] ED patient was noted to be afebrile temp 36.8C, BP 142/111, HR 88, RR 22, SPO2 100% on 15L non-rebreather. Patient now on 2L via nasal cannula with SPO2 96%. CXR obtained and shows non specific findings of increased interstitial markings. CTA PE obtained and shows no PE, dilated RV and reflux of contract into the IVC and hepatic vein consistent with (R) heart failure, chronic bronchitis, and emphysema. US  DVT (B)LE negative for DVT. Labs notable Negative COVID, flu, RSV,for BNP 284.2, BUN 24, troponin 41--> 38 with no report of chest pain.  Lactic acid 0.9.  Blood cultures drawn and in process.  He was given Lasix, DuoNebs, and Solu-Medrol .  TRH contacted for admission.  11/5.  Pulse ox with ambulation down to 85%.  Patient admitted with COPD and CHF exacerbation.  Patient  also states that he is short of breath and he is waiting to die.  Appreciate psychiatric consultation.  Patient is homeless. 11/6.  Patient states that he has been sleepy.  Venous blood gas shows a pCO2 of 79 with a pH of 7.41.  Will start BiPAP at night.  Try to see if we can taper off oxygen. 11/7.  Patient hypoxic with walking around with pulmonary to 87%. 11/8.  Patient's pulse ox dropped down to 84% with ambulation. 11/9.  Patient had a pulse ox of 78% on room air at rest but he stated he walked back from the bathroom. 11/10.  Pulse ox in the low 80s with ambulation.  Likely will need chronic oxygen.  Assessment and Plan: * Acute respiratory failure with hypoxia and hypercapnia (HCC) Pulse ox dropped to 82% with ambulation yesterday.  Awaiting pulse ox from today.  The patient is homeless will need to be able to come off oxygen prior to disposition.  Continue to try to see if we can get off oxygen on a daily basis.  VBG shows a pCO2 of 81.  Patient unable to tolerate BiPAP.  The patient likely has acute on chronic respiratory failure.   COPD with acute exacerbation (HCC) Patient on Zithromax , on long prednisone  taper and nebulizer treatments.  Patient seen by pulmonary.  Acute on chronic diastolic CHF (congestive heart failure) (HCC) EF 55 to 60%.  Initially on IV Lasix.  On 11/6 and gave a dose of acetazolamide with CO2  elevation on chemistry.  Hold Lasix today with CO2 on chemistry up at 38.  CT scan commenting on elevated right heart pressures.  No current signs of heart failure.  Tobacco use disorder Must stop smoking.  Headache Improved.  Tylenol  as needed.  IV magnesium  given on 11/5.  Increased anion gap metabolic acidosis Resolved  Hypernatremia Resolved  Depression Patient on Lexapro  and Seroquel  at night  Protein-calorie malnutrition, severe Continue supplements        Subjective: Patient states he did not sleep well last night.  Felt a little tired.  Unable to  tolerate BiPAP.  Some shortness of breath and cough.  Admitted with COPD exacerbation and acute respiratory failure  Physical Exam: Vitals:   10/26/24 1616 10/26/24 2012 10/27/24 0445 10/27/24 0812  BP: 115/79 (!) 137/93 107/73 128/86  Pulse: 71 81 67 65  Resp: 17 18 14 20   Temp: 98 F (36.7 C) 98.6 F (37 C) 98.6 F (37 C) 98.8 F (37.1 C)  TempSrc: Oral Oral Oral Oral  SpO2: 92% 96% 98% 97%  Weight:   52.5 kg   Height:       Physical Exam HENT:     Head: Normocephalic.     Mouth/Throat:     Pharynx: No oropharyngeal exudate.  Eyes:     General: Lids are normal.     Conjunctiva/sclera: Conjunctivae normal.  Cardiovascular:     Rate and Rhythm: Normal rate and regular rhythm.     Heart sounds: Normal heart sounds, S1 normal and S2 normal.  Pulmonary:     Breath sounds: Examination of the right-lower field reveals decreased breath sounds. Examination of the left-lower field reveals decreased breath sounds. Decreased breath sounds present. No wheezing, rhonchi or rales.  Abdominal:     Palpations: Abdomen is soft.     Tenderness: There is no abdominal tenderness.  Musculoskeletal:     Right lower leg: No swelling.     Left lower leg: No swelling.  Skin:    General: Skin is warm.     Findings: No rash.  Neurological:     Mental Status: He is alert and oriented to person, place, and time.     Data Reviewed: Last creatinine 0.57 last CO2 38  Disposition: Status is: Inpatient Remains inpatient appropriate because: Since patient is homeless and currently unable to get off oxygen we are in a tough situation.  We do not have a safe disposition  Planned Discharge Destination: Will need some sort of placement    Time spent: 27 minutes  Author: Charlie Patterson, MD 10/27/2024 12:09 PM  For on call review [email or web address].

## 2024-10-27 NOTE — Progress Notes (Addendum)
 Nutrition Follow-up  DOCUMENTATION CODES:   Severe malnutrition in context of social or environmental circumstances  INTERVENTION:   -Continue 2 gram sodium diet -Continue MVI with minerals daily -D/c Ensure Plus High Protein po BID, each supplement provides 350 kcal and 20 grams of protein  -Magic cup TID with meals, each supplement provides 290 kcal and 9 grams of protein   NUTRITION DIAGNOSIS:   Severe Malnutrition related to social / environmental circumstances as evidenced by moderate fat depletion, severe fat depletion, moderate muscle depletion, severe muscle depletion, edema.  Ongoing  GOAL:   Patient will meet greater than or equal to 90% of their needs  Progressing   MONITOR:   PO intake, Supplement acceptance  REASON FOR ASSESSMENT:   Malnutrition Screening Tool    ASSESSMENT:   58 y.o. year old male with past medical history of  tobacco use disorder reporting a 41 year pack per year history, smoking ~1 pack per day since he was 58 years old. He denies any additional medical history though he has not seen a medical provider in ~20 years. He presents with shortness of breath that began ~ 9 months ago and worsened ~3 months ago PTA. He reports today his dyspnea is not significantly different than it was 3 months ago PTA. He endorses a chronic cough that has been present since childhood.  Reviewed I/O;s: -1.2 L x 24 hours and -3.5 L since admission  UOP: 1.2 L x 24 hours   Spoke with patient at bedside, who was lying down at time of visit. Patient eager to engage RD in conversation, but reports feeling poorly due to feeling chronically weak. He states that he was unaware that his COPD would make him feel so bad; he endorses fear of progression of COPD, as this is how his father passed away about 15 years ago. Emotional support provided. RD offered chaplain consult for additional layer of support, however, patient politely declined offer.   Patient reports he has  a fair appetite and has been trying to eat. Noted he consumed 100% of breakfast. Documented meal completions 90-100%. RD discussed importance of good meal and supplement intake to promote healing. Pt hates the Ensure supplements. RD reviewed other supplements on formulary and discussed importance of adequate nutrition for recovery, as well as increased nutritional needs related to COPD. Patient amenable to [employer].   Per MD notes, patient is unable to discharge until he weans off oxygen as he is homeless. Nurse navigator also following.   Patient expresses frustration about being in the hospital and his overall situation, but is hoping he is able to qualify for disability soon, which would help him a great deal. RD provided encouragement and emotional support.   Medications reviewed and include lovenox , protonix, and seroquel .   Labs reviewed.    Diet Order:   Diet Order             Diet 2 gram sodium Fluid consistency: Thin  Diet effective now                   EDUCATION NEEDS:   Education needs have been addressed  Skin:  Skin Assessment: Reviewed RN Assessment  Last BM:  10/26/24  Height:   Ht Readings from Last 1 Encounters:  10/20/24 5' 4 (1.626 m)    Weight:   Wt Readings from Last 1 Encounters:  10/27/24 52.5 kg    Ideal Body Weight:  59.1 kg  BMI:  Body mass index is  19.87 kg/m.  Estimated Nutritional Needs:   Kcal:  1950-2150  Protein:  105-120 grams  Fluid:  1.9-2.1 L    Margery ORN, RD, LDN, CDCES Registered Dietitian III Certified Diabetes Care and Education Specialist If unable to reach this RD, please use RD Inpatient group chat on secure chat between hours of 8am-4 pm daily

## 2024-10-27 NOTE — Progress Notes (Signed)
 PULMONOLOGY         Date: 10/27/2024,   MRN# [id number] Corey House. [birth year]     AdmissionWeight: 58.8 kg                 CurrentWeight: 52.5 kg  Referring provider: Dr Josette   CHIEF COMPLAINT:   Acute exacerbation of CHF and COPD   HISTORY OF PRESENT ILLNESS   This is a 58 yo M with hx of chronic cough, COPD, CHF, Urolithiasis, tobacco dependence who came in due to worsening SOB.  [name] hypoxemic requiring HHFNC at 15L/min to reach normoxia.  Since admission he has partially improved with weaning down of O2 req.  He had CT chest with PE protocol and was found to have chronic bronchitic COPD and centrilobular emphysema with no venous thromboemoblism.  He also had noted RV dilation suggestive of RV failure.  Cardiac biomarkers were mildy elevated consisted with acute on chronic CHF. He had septic workup performed and had steroids with antibiotics prescribed. He also had DVT studies and this was negative for bilateral LE dvt.  Patient requested pulmonary evaluation and for this reason consultation was placed.   10/22/24- patient is improved, CXR repeating today. Preparing for dc home in AM. Plan to wean steroids and abx to PO regimen on outpatient basis.   10/23/24- patient is able to ambulate around hallway.  He did have very transient mild hypoxemia. He is improved quite a bit. We discussed smoking cessation.  He is concerned about his homelessness. His blood work is improved and blood gas is with chronic hypercapnic respiratory failure.   10/24/24- patient is resting in bed comfortably, reports expectoration of mucus this am and feels slightly better.  He was able to walk around hallway today. CXR is clear.  Plan to wean off oxygen and dc home when possible.  He's on zithromax  250 PO and prednisone  taper. He should use IS and flutter valve.   10/25/24- patient continues to desaturate while ambulating.  He drops his oxygen down to low 80s with slow  walking. Difficult situation with homelessness and needing oxygen therapy and possible BIPAP  10/26/24- patient reports feeling week and dyspneic in bed. He is working on obtaining placement.  He is with advanced COPD.   10/27/24- patient laying in bed in NAD.  His bloodwork is stable. He reports dyspnea at rest. He will need oxygen upon dc it seems due to recurrent hypoxemia.  PAST MEDICAL HISTORY   Past Medical History:  Diagnosis Date   Chronic cough    Urolithiasis    Urolithiasis      SURGICAL HISTORY   Past Surgical History:  Procedure Laterality Date   CATARACT EXTRACTION W/PHACO Left 08/09/2021   Procedure: CATARACT EXTRACTION PHACO AND INTRAOCULAR LENS PLACEMENT (IOC) LEFT .051 00:10.0;  Surgeon: Mittie Gaskin, MD;  Location: [facility];  Service: Ophthalmology;  Laterality: Left;   CATARACT EXTRACTION W/PHACO Right 10/04/2021   Procedure: CATARACT EXTRACTION PHACO AND INTRAOCULAR LENS PLACEMENT (IOC) RIGHT 0.67 00:13.8;  Surgeon: Mittie Gaskin, MD;  Location: [facility];  Service: Ophthalmology;  Laterality: Right;  general anesthesia needs to be last case   HERNIA REPAIR     x2   MANDIBLE FRACTURE SURGERY       FAMILY HISTORY   History reviewed. No pertinent family history.   SOCIAL HISTORY   Social History   Tobacco Use   Smoking status: Every Day    Current  packs/day: 1.00    Average packs/day: 1 pack/day for 35.0 years (35.0 ttl pk-yrs)    Types: Cigarettes   Smokeless tobacco: Never   Tobacco comments:    Started smoking age 58  Vaping Use   Vaping status: Never Used  Substance Use Topics   Alcohol use: No   Drug use: Not Currently     MEDICATIONS    Home Medication:    Current Medication:  Current Facility-Administered Medications:    acetaminophen  (TYLENOL ) tablet 650 mg, 650 mg, Oral, Q6H PRN, Josette Ade, MD, 650 mg at 10/27/24 1310   albuterol  (PROVENTIL ) (2.5 MG/3ML) 0.083% nebulizer solution 2.5  mg, 2.5 mg, Nebulization, Q2H PRN, Foust, Katy L, NP, 2.5 mg at 10/23/24 9176   ALPRAZolam (XANAX) tablet 0.25 mg, 0.25 mg, Oral, TID PRN, Josette Ade, MD, 0.25 mg at 10/27/24 1011   artificial tears ophthalmic solution 1 drop, 1 drop, Both Eyes, PRN, Foust, Katy L, NP   azithromycin  (ZITHROMAX ) tablet 250 mg, 250 mg, Oral, Daily, Kindell Strada, MD, 250 mg at 10/27/24 1001   enoxaparin  (LOVENOX ) injection 40 mg, 40 mg, Subcutaneous, Q24H, Foust, Katy L, NP, 40 mg at 10/27/24 1723   escitalopram  (LEXAPRO ) tablet 10 mg, 10 mg, Oral, Daily, Claudene Sham B, NP, 10 mg at 10/27/24 1001   guaiFENesin  (MUCINEX ) 12 hr tablet 600 mg, 600 mg, Oral, BID, Foust, Katy L, NP, 600 mg at 10/27/24 0959   melatonin tablet 5 mg, 5 mg, Oral, QHS PRN, Foust, Katy L, NP, 5 mg at 10/24/24 2111   multivitamin with minerals tablet 1 tablet, 1 tablet, Oral, Daily, Josette Ade, MD, 1 tablet at 10/27/24 9040   nicotine  (NICODERM CQ  - dosed in mg/24 hours) patch 14 mg, 14 mg, Transdermal, Daily, Foust, Katy L, NP, 14 mg at 10/27/24 1003   nicotine  polacrilex (NICORETTE) gum 2 mg, 2 mg, Oral, PRN, Foust, Katy L, NP   Oral care mouth rinse, 15 mL, Mouth Rinse, PRN, Josette, Richard, MD   pantoprazole (PROTONIX) EC tablet 40 mg, 40 mg, Oral, Daily, Foust, Katy L, NP, 40 mg at 10/27/24 0959   [COMPLETED] predniSONE  (DELTASONE ) tablet 50 mg, 50 mg, Oral, Q breakfast, 50 mg at 10/24/24 0841 **FOLLOWED BY** [COMPLETED] predniSONE  (DELTASONE ) tablet 45 mg, 45 mg, Oral, Q breakfast, 45 mg at 10/25/24 0930 **FOLLOWED BY** [COMPLETED] predniSONE  (DELTASONE ) tablet 40 mg, 40 mg, Oral, Q breakfast, 40 mg at 10/26/24 0945 **FOLLOWED BY** [COMPLETED] predniSONE  (DELTASONE ) tablet 35 mg, 35 mg, Oral, Q breakfast, 35 mg at 10/27/24 0959 **FOLLOWED BY** [START ON 10/28/2024] predniSONE  (DELTASONE ) tablet 30 mg, 30 mg, Oral, Q breakfast **FOLLOWED BY** [START ON 10/29/2024] predniSONE  (DELTASONE ) tablet 25 mg, 25 mg, Oral, Q breakfast  **FOLLOWED BY** [START ON 10/30/2024] predniSONE  (DELTASONE ) tablet 20 mg, 20 mg, Oral, Q breakfast **FOLLOWED BY** [START ON 10/31/2024] predniSONE  (DELTASONE ) tablet 15 mg, 15 mg, Oral, Q breakfast **FOLLOWED BY** [START ON 11/01/2024] predniSONE  (DELTASONE ) tablet 10 mg, 10 mg, Oral, Q breakfast **FOLLOWED BY** [START ON 11/02/2024] predniSONE  (DELTASONE ) tablet 5 mg, 5 mg, Oral, Q breakfast, Calahan Pak, MD   QUEtiapine  (SEROQUEL ) tablet 25 mg, 25 mg, Oral, QHS, Smith, Annie B, NP, 25 mg at 10/26/24 2146    ALLERGIES   Patient has no known allergies.     REVIEW OF SYSTEMS    Review of Systems:  Gen:  Denies  fever, sweats, chills weigh loss  HEENT: Denies blurred vision, double vision, ear pain, eye pain, hearing loss, nose bleeds, sore throat Cardiac:  No dizziness, chest pain or heaviness, chest tightness,edema Resp:   reports dyspnea chronically  Gi: Denies swallowing difficulty, stomach pain, nausea or vomiting, diarrhea, constipation, bowel incontinence Gu:  Denies bladder incontinence, burning urine Ext:   Denies Joint pain, stiffness or swelling Skin: Denies  skin rash, easy bruising or bleeding or hives Endoc:  Denies polyuria, polydipsia , polyphagia or weight change Psych:   Denies depression, insomnia or hallucinations   Other:  All other systems negative   VS: BP 109/80 (BP Location: Left Arm)   Pulse 79   Temp 98.4 F (36.9 C) (Oral)   Resp 16   Ht 5' 4 (1.626 m)   Wt 52.5 kg   SpO2 94%   BMI 19.87 kg/m      PHYSICAL EXAM    GENERAL:NAD, no fevers, chills, no weakness no fatigue HEAD: Normocephalic, atraumatic.  EYES: Pupils equal, round, reactive to light. Extraocular muscles intact. No scleral icterus.  MOUTH: Moist mucosal membrane. Dentition intact. No abscess noted.  EAR, NOSE, THROAT: Clear without exudates. No external lesions.  NECK: Supple. No thyromegaly. No nodules. No JVD.  PULMONARY: decreased breath sounds with mild rhonchi  worse at bases bilaterally.  CARDIOVASCULAR: S1 and S2. Regular rate and rhythm. No murmurs, rubs, or gallops. No edema. Pedal pulses 2+ bilaterally.  GASTROINTESTINAL: Soft, nontender, nondistended. No masses. Positive bowel sounds. No hepatosplenomegaly.  MUSCULOSKELETAL: No swelling, clubbing, or edema. Range of motion full in all extremities.  NEUROLOGIC: Cranial nerves II through XII are intact. No gross focal neurological deficits. Sensation intact. Reflexes intact.  SKIN: No ulceration, lesions, rashes, or cyanosis. Skin warm and dry. Turgor intact.  PSYCHIATRIC: Mood, affect within normal limits. The patient is awake, alert and oriented x 3. Insight, judgment intact.       IMAGING     rative & Impression  EXAM: CTA of the Chest with contrast for PE 10/20/2024 02:19:01 PM   TECHNIQUE: CTA of the chest was performed without and with the administration of 75 mL of iohexol  (OMNIPAQUE ) 350 MG/ML injection. Multiplanar reformatted images are provided for review. MIP images are provided for review. Automated exposure control, iterative reconstruction, and/or weight based adjustment of the mA/kV was utilized to reduce the radiation dose to as low as reasonably achievable.   COMPARISON: Same day x-ray and CTA chest 01/04/2023.   CLINICAL HISTORY: Pulmonary embolism (PE) suspected, high prob.   FINDINGS:   PULMONARY ARTERIES: Pulmonary arteries are adequately opacified for evaluation. Negative for pulmonary embolism. Main pulmonary artery is normal in caliber.   MEDIASTINUM: Dilated right ventricle with reflux of contrast into the IVC and hepatic veins compatible with elevated right heart pressures. There is no acute abnormality of the thoracic aorta.   LYMPH NODES: No mediastinal, hilar or axillary lymphadenopathy.   LUNGS AND PLEURA: Diffuse bronchial wall thickening. Mucous plugging in the lower lobes. Emphysema. No focal consolidation or pulmonary edema. No pleural  effusion or pneumothorax.   UPPER ABDOMEN: Limited images of the upper abdomen are unremarkable.   SOFT TISSUES AND BONES: No acute bone or soft tissue abnormality.   IMPRESSION: 1. No pulmonary embolism. 2. Dilated right ventricle with reflux of contrast into the IVC and hepatic veins, compatible with elevated right heart pressures. 3. Chronic bronchitis. 4. Emphysema. Given patient age, consider evaluation for low-dose CT lung cancer screening.   Electronically signed by: Norman Gatlin MD 10/20/2024 02:49 PM EST RP Workstation: [id number]      ASSESSMENT/PLAN     Acute exacerbation of COPD-  RESOLVED   - continue nebulizer therapy Duoneb 16h prn   - s/p solumedrol 125x1 followed by 40mg  IV daily -wean to pred  -adding zithromax  PO - PT/OT -Incentive spirometry and flutter valve    Mucus plugging of tracheobronchial tree-RESOLVED     - considering outpatient bronchoscopy    Tobacco dependence   - 14mg  transdermal patch     Enlarged Right ventricle Suggestive of pulmonary hypertension - would recommend RHC via cardiology service when possible non urgently    Psychiatric history   Patient is currently being evaluated by psychiatrist     Chronic hypoxemia    - patient continues to desaturate while ambulating slowly around hallway.  Unable to dc on oxygen due to homelessness           Thank you for allowing me to participate in the care of this patient.   Patient/Family are satisfied with care plan and all questions have been answered.    Provider disclosure: Patient with at least one acute or chronic illness or injury that poses a threat to life or bodily function and is being managed actively during this encounter.  All of the below services have been performed independently by signing provider:  review of prior documentation from internal and or external health records.  Review of previous and current lab results.  Interview and comprehensive  assessment during patient visit today. Review of current and previous chest radiographs/CT scans. Discussion of management and test interpretation with health care team and patient/family.   This document was prepared using Dragon voice recognition software and may include unintentional dictation errors.     Kinesha Auten, M.D.  Division of Pulmonary & Critical Care Medicine

## 2024-10-27 NOTE — Progress Notes (Signed)
 OT Cancellation Note  Patient Details Name: Corey House. MRN: [id number] DOB: 06-21-1966   Cancelled Treatment:    Reason Eval/Treat Not Completed: Fatigue/lethargy limiting ability to participate;Other (comment) (patient reports he doesn't feel good today- he has already walked with nursing today and is still very tired. he reports 'I just don't feel good when OT asks follow up questions, he is not able to elaborate except that he feels very anxious. RN notified)  Maryelizabeth CHRISTELLA Clause 10/27/2024, 3:56 PM

## 2024-10-28 DIAGNOSIS — J9602 Acute respiratory failure with hypercapnia: Secondary | ICD-10-CM | POA: Diagnosis not present

## 2024-10-28 DIAGNOSIS — J9601 Acute respiratory failure with hypoxia: Secondary | ICD-10-CM | POA: Diagnosis not present

## 2024-10-28 LAB — BASIC METABOLIC PANEL WITH GFR
Anion gap: 5 (ref 5–15)
BUN: 23 mg/dL — ABNORMAL HIGH (ref 6–20)
CO2: 41 mmol/L — ABNORMAL HIGH (ref 22–32)
Calcium: 9.2 mg/dL (ref 8.9–10.3)
Chloride: 96 mmol/L — ABNORMAL LOW (ref 98–111)
Creatinine, Ser: 0.66 mg/dL (ref 0.61–1.24)
GFR, Estimated: >60 mL/min (ref >60)
Glucose, Bld: 97 mg/dL (ref 70–99)
Potassium: 4.5 mmol/L (ref 3.5–5.1)
Sodium: 143 mmol/L (ref 135–145)

## 2024-10-28 LAB — CBC
HCT: 46.8 % (ref 39.0–52.0)
Hemoglobin: 14.9 g/dL (ref 13.0–17.0)
MCH: 30.2 pg (ref 26.0–34.0)
MCHC: 31.8 g/dL (ref 30.0–36.0)
MCV: 94.7 fL (ref 80.0–100.0)
Platelets: 286 K/uL (ref 150–400)
RBC: 4.94 MIL/uL (ref 4.22–5.81)
RDW: 13.7 % (ref 11.5–15.5)
WBC: 6.6 K/uL (ref 4.0–10.5)
nRBC: 0 % (ref 0.0–0.2)

## 2024-10-28 NOTE — Progress Notes (Signed)
 Occupational Therapy Treatment Patient Details Name: Corey House. MRN: [id number] DOB: [birth year] Today's Date: 10/28/2024   History of present illness Pt is a 58 y.o. year old male with past medical history of tobacco use. He presents to [facility] ED with shortness of breath. He has dyspnea at rest and on exertion with a chronic cough that has been present since childhood. MD dx includes: Emphysema and hypoxia.   OT comments  Patient seen for OT treatment on this date. Upon arrival to room patient rest in bed, agreeable to treatment. Focus of tx was on progressing dynamic standing balance/tolerance while monitoring O2; patient engaged in5 minutes of continuous standing therex including grape vine walk, mini squats, heel raises, side kicks, front kicks. O2 stayed above 90% while on 3L. OT instructed on diaphragmatic breathing and had patient watch self in mirror breathing to reduce scapular elevation during breathing to allow for fuller breaths, patient with good return demo and O2 remained above 90%.   Patient ended treatment sitting up in bed with all needs within reach. Patient making good progress toward goals, will continue to follow POC. Discharge recommendation remains appropriate.        If plan is discharge home, recommend the following:  Assist for transportation;A little help with bathing/dressing/bathroom   Equipment Recommendations  None recommended by OT    Recommendations for Other Services      Precautions / Restrictions Precautions Precautions: Fall Recall of Precautions/Restrictions: Intact Restrictions Weight Bearing Restrictions Per Provider Order: No       Mobility Bed Mobility Overal bed mobility: Independent                  Transfers Overall transfer level: Modified independent                       Balance Overall balance assessment: Modified Independent                                         ADL  either performed or assessed with clinical judgement   ADL                                              Extremity/Trunk Assessment              Vision       Perception     Praxis     Communication Communication Communication: No apparent difficulties   Cognition Arousal: Alert Behavior During Therapy: WFL for tasks assessed/performed, Anxious Cognition: No apparent impairments                               Following commands: Intact        Cueing   Cueing Techniques: Verbal cues  Exercises Exercises: Other exercises Other Exercises Other Exercises: grape vine, mini squats, heel raises, leg abduction, front kicks: 10 reps each, O2 monitoreed throughout, never below 90%    Shoulder Instructions       General Comments      Pertinent Vitals/ Pain       Pain Assessment Pain Assessment: No/denies pain  Home Living  Prior Functioning/Environment              Frequency  Min 1X/week        Progress Toward Goals  OT Goals(current goals can now be found in the care plan section)  Progress towards OT goals: Progressing toward goals  Acute Rehab OT Goals Patient Stated Goal: to get stronger OT Goal Formulation: With patient Time For Goal Achievement: 11/04/24 Potential to Achieve Goals: Good ADL Goals Pt Will Perform Grooming: with modified independence;sitting;standing Pt Will Perform Lower Body Dressing: with modified independence;sitting/lateral leans;sit to/from stand Pt Will Transfer to Toilet: with modified independence;ambulating Pt Will Perform Toileting - Clothing Manipulation and hygiene: with modified independence;sitting/lateral leans;sit to/from stand  Plan      Co-evaluation                 AM-PAC OT 6 Clicks Daily Activity     Outcome Measure   Help from another person eating meals?: None Help from another person taking care of  personal grooming?: None Help from another person toileting, which includes using toliet, bedpan, or urinal?: None Help from another person bathing (including washing, rinsing, drying)?: A Little Help from another person to put on and taking off regular upper body clothing?: None Help from another person to put on and taking off regular lower body clothing?: None 6 Click Score: 23    End of Session Equipment Utilized During Treatment: Oxygen  OT Visit Diagnosis: Other abnormalities of gait and mobility (R26.89)   Activity Tolerance Patient tolerated treatment well   Patient Left in bed;with call bell/phone within reach   Nurse Communication          Time: 8383-8367 OT Time Calculation (min): 16 min  Charges: OT General Charges $OT Visit: 1 Visit OT Treatments $Therapeutic Exercise: 8-22 mins  Rogers Clause, OT/L MSOT, 10/28/2024

## 2024-10-28 NOTE — Progress Notes (Signed)
 PULMONOLOGY         Date: 10/28/2024,   MRN# [id number] Corey House. 1965-12-19     AdmissionWeight: 58.8 kg                 CurrentWeight: 53.1 kg  Referring provider: Dr Josette   CHIEF COMPLAINT:   Acute exacerbation of CHF and COPD   HISTORY OF PRESENT ILLNESS   This is a 58 yo M with hx of chronic cough, COPD, CHF, Urolithiasis, tobacco dependence who came in due to worsening SOB.  [name] hypoxemic requiring HHFNC at 15L/min to reach normoxia.  Since admission he has partially improved with weaning down of O2 req.  He had CT chest with PE protocol and was found to have chronic bronchitic COPD and centrilobular emphysema with no venous thromboemoblism.  He also had noted RV dilation suggestive of RV failure.  Cardiac biomarkers were mildy elevated consisted with acute on chronic CHF. He had septic workup performed and had steroids with antibiotics prescribed. He also had DVT studies and this was negative for bilateral LE dvt.  Patient requested pulmonary evaluation and for this reason consultation was placed.   10/22/24- patient is improved, CXR repeating today. Preparing for dc home in AM. Plan to wean steroids and abx to PO regimen on outpatient basis.   10/23/24- patient is able to ambulate around hallway.  He did have very transient mild hypoxemia. He is improved quite a bit. We discussed smoking cessation.  He is concerned about his homelessness. His blood work is improved and blood gas is with chronic hypercapnic respiratory failure.   10/24/24- patient is resting in bed comfortably, reports expectoration of mucus this am and feels slightly better.  He was able to walk around hallway today. CXR is clear.  Plan to wean off oxygen and dc home when possible.  He's on zithromax  250 PO and prednisone  taper. He should use IS and flutter valve.   10/25/24- patient continues to desaturate while ambulating.  He drops his oxygen down to low 80s with slow  walking. Difficult situation with homelessness and needing oxygen therapy and possible BIPAP  10/26/24- patient reports feeling week and dyspneic in bed. He is working on obtaining placement.  He is with advanced COPD.   10/27/24- patient laying in bed in NAD.  His bloodwork is stable. He reports dyspnea at rest. He will need oxygen upon dc it seems due to recurrent hypoxemia.  10/28/24 - patient is laying in bed in do distress. He is on 3L/min [place].  He reports dyspnea at rest but does not appear to be using accessory muscles of respirations and is not tachypneic on examination. His lung auscultation is clear bilaterally.   PAST MEDICAL HISTORY   Past Medical History:  Diagnosis Date   Chronic cough    Urolithiasis    Urolithiasis      SURGICAL HISTORY   Past Surgical History:  Procedure Laterality Date   CATARACT EXTRACTION W/PHACO Left 08/09/2021   Procedure: CATARACT EXTRACTION PHACO AND INTRAOCULAR LENS PLACEMENT (IOC) LEFT .051 00:10.0;  Surgeon: Mittie Gaskin, MD;  Location: [facility];  Service: Ophthalmology;  Laterality: Left;   CATARACT EXTRACTION W/PHACO Right 10/04/2021   Procedure: CATARACT EXTRACTION PHACO AND INTRAOCULAR LENS PLACEMENT (IOC) RIGHT 0.67 00:13.8;  Surgeon: Mittie Gaskin, MD;  Location: [facility];  Service: Ophthalmology;  Laterality: Right;  general anesthesia needs to be last case   HERNIA REPAIR  x2   MANDIBLE FRACTURE SURGERY       FAMILY HISTORY   History reviewed. No pertinent family history.   SOCIAL HISTORY   Social History   Tobacco Use   Smoking status: Every Day    Current packs/day: 1.00    Average packs/day: 1 pack/day for 35.0 years (35.0 ttl pk-yrs)    Types: Cigarettes   Smokeless tobacco: Never   Tobacco comments:    Started smoking age 51  Vaping Use   Vaping status: Never Used  Substance Use Topics   Alcohol use: No   Drug use: Not Currently     MEDICATIONS    Home  Medication:    Current Medication:  Current Facility-Administered Medications:    acetaminophen  (TYLENOL ) tablet 650 mg, 650 mg, Oral, Q6H PRN, Josette Ade, MD, 650 mg at 10/28/24 1303   albuterol  (PROVENTIL ) (2.5 MG/3ML) 0.083% nebulizer solution 2.5 mg, 2.5 mg, Nebulization, Q2H PRN, Foust, Katy L, NP, 2.5 mg at 10/23/24 9176   ALPRAZolam (XANAX) tablet 0.25 mg, 0.25 mg, Oral, TID PRN, Josette Ade, MD, 0.25 mg at 10/28/24 0847   artificial tears ophthalmic solution 1 drop, 1 drop, Both Eyes, PRN, Foust, Katy L, NP   azithromycin  (ZITHROMAX ) tablet 250 mg, 250 mg, Oral, Daily, Sylar Voong, MD, 250 mg at 10/28/24 0848   enoxaparin  (LOVENOX ) injection 40 mg, 40 mg, Subcutaneous, Q24H, Foust, Katy L, NP, 40 mg at 10/27/24 1723   escitalopram  (LEXAPRO ) tablet 10 mg, 10 mg, Oral, Daily, Smith, Annie B, NP, 10 mg at 10/28/24 0848   guaiFENesin  (MUCINEX ) 12 hr tablet 600 mg, 600 mg, Oral, BID, Foust, Katy L, NP, 600 mg at 10/28/24 0849   melatonin tablet 5 mg, 5 mg, Oral, QHS PRN, Foust, Katy L, NP, 5 mg at 10/27/24 2253   multivitamin with minerals tablet 1 tablet, 1 tablet, Oral, Daily, Josette Ade, MD, 1 tablet at 10/28/24 0848   nicotine  (NICODERM CQ  - dosed in mg/24 hours) patch 14 mg, 14 mg, Transdermal, Daily, Foust, Katy L, NP, 14 mg at 10/28/24 0848   nicotine  polacrilex (NICORETTE) gum 2 mg, 2 mg, Oral, PRN, Foust, Katy L, NP   Oral care mouth rinse, 15 mL, Mouth Rinse, PRN, Josette, Richard, MD   pantoprazole (PROTONIX) EC tablet 40 mg, 40 mg, Oral, Daily, Foust, Katy L, NP, 40 mg at 10/28/24 0848   [COMPLETED] predniSONE  (DELTASONE ) tablet 50 mg, 50 mg, Oral, Q breakfast, 50 mg at 10/24/24 0841 **FOLLOWED BY** [COMPLETED] predniSONE  (DELTASONE ) tablet 45 mg, 45 mg, Oral, Q breakfast, 45 mg at 10/25/24 0930 **FOLLOWED BY** [COMPLETED] predniSONE  (DELTASONE ) tablet 40 mg, 40 mg, Oral, Q breakfast, 40 mg at 10/26/24 0945 **FOLLOWED BY** [COMPLETED] predniSONE  (DELTASONE )  tablet 35 mg, 35 mg, Oral, Q breakfast, 35 mg at 10/27/24 0959 **FOLLOWED BY** [COMPLETED] predniSONE  (DELTASONE ) tablet 30 mg, 30 mg, Oral, Q breakfast, 30 mg at 10/28/24 0849 **FOLLOWED BY** [START ON 10/29/2024] predniSONE  (DELTASONE ) tablet 25 mg, 25 mg, Oral, Q breakfast **FOLLOWED BY** [START ON 10/30/2024] predniSONE  (DELTASONE ) tablet 20 mg, 20 mg, Oral, Q breakfast **FOLLOWED BY** [START ON 10/31/2024] predniSONE  (DELTASONE ) tablet 15 mg, 15 mg, Oral, Q breakfast **FOLLOWED BY** [START ON 11/01/2024] predniSONE  (DELTASONE ) tablet 10 mg, 10 mg, Oral, Q breakfast **FOLLOWED BY** [START ON 11/02/2024] predniSONE  (DELTASONE ) tablet 5 mg, 5 mg, Oral, Q breakfast, Kalayna Noy, MD   QUEtiapine  (SEROQUEL ) tablet 25 mg, 25 mg, Oral, QHS, Smith, Annie B, NP, 25 mg at 10/27/24 2253    ALLERGIES  Patient has no known allergies.     REVIEW OF SYSTEMS    Review of Systems:  Gen:  Denies  fever, sweats, chills weigh loss  HEENT: Denies blurred vision, double vision, ear pain, eye pain, hearing loss, nose bleeds, sore throat Cardiac:  No dizziness, chest pain or heaviness, chest tightness,edema Resp:   reports dyspnea chronically  Gi: Denies swallowing difficulty, stomach pain, nausea or vomiting, diarrhea, constipation, bowel incontinence Gu:  Denies bladder incontinence, burning urine Ext:   Denies Joint pain, stiffness or swelling Skin: Denies  skin rash, easy bruising or bleeding or hives Endoc:  Denies polyuria, polydipsia , polyphagia or weight change Psych:   Denies depression, insomnia or hallucinations   Other:  All other systems negative   VS: BP 120/89 (BP Location: Left Arm)   Pulse 77   Temp 98.7 F (37.1 C) (Oral)   Resp 16   Ht 5' 4 (1.626 m)   Wt 53.1 kg   SpO2 100%   BMI 20.09 kg/m      PHYSICAL EXAM    GENERAL:NAD, no fevers, chills, no weakness no fatigue HEAD: Normocephalic, atraumatic.  EYES: Pupils equal, round, reactive to light. Extraocular  muscles intact. No scleral icterus.  MOUTH: Moist mucosal membrane. Dentition intact. No abscess noted.  EAR, NOSE, THROAT: Clear without exudates. No external lesions.  NECK: Supple. No thyromegaly. No nodules. No JVD.  PULMONARY: decreased breath sounds clear to auscultation CARDIOVASCULAR: S1 and S2. Regular rate and rhythm. No murmurs, rubs, or gallops. No edema. Pedal pulses 2+ bilaterally.  GASTROINTESTINAL: Soft, nontender, nondistended. No masses. Positive bowel sounds. No hepatosplenomegaly.  MUSCULOSKELETAL: No swelling, clubbing, or edema. Range of motion full in all extremities.  NEUROLOGIC: Cranial nerves II through XII are intact. No gross focal neurological deficits. Sensation intact. Reflexes intact.  SKIN: No ulceration, lesions, rashes, or cyanosis. Skin warm and dry. Turgor intact.  PSYCHIATRIC: Mood, affect within normal limits. The patient is awake, alert and oriented x 3. Insight, judgment intact.       IMAGING     rative & Impression  EXAM: CTA of the Chest with contrast for PE 10/20/2024 02:19:01 PM   TECHNIQUE: CTA of the chest was performed without and with the administration of 75 mL of iohexol  (OMNIPAQUE ) 350 MG/ML injection. Multiplanar reformatted images are provided for review. MIP images are provided for review. Automated exposure control, iterative reconstruction, and/or weight based adjustment of the mA/kV was utilized to reduce the radiation dose to as low as reasonably achievable.   COMPARISON: Same day x-ray and CTA chest 01/04/2023.   CLINICAL HISTORY: Pulmonary embolism (PE) suspected, high prob.   FINDINGS:   PULMONARY ARTERIES: Pulmonary arteries are adequately opacified for evaluation. Negative for pulmonary embolism. Main pulmonary artery is normal in caliber.   MEDIASTINUM: Dilated right ventricle with reflux of contrast into the IVC and hepatic veins compatible with elevated right heart pressures. There is no acute  abnormality of the thoracic aorta.   LYMPH NODES: No mediastinal, hilar or axillary lymphadenopathy.   LUNGS AND PLEURA: Diffuse bronchial wall thickening. Mucous plugging in the lower lobes. Emphysema. No focal consolidation or pulmonary edema. No pleural effusion or pneumothorax.   UPPER ABDOMEN: Limited images of the upper abdomen are unremarkable.   SOFT TISSUES AND BONES: No acute bone or soft tissue abnormality.   IMPRESSION: 1. No pulmonary embolism. 2. Dilated right ventricle with reflux of contrast into the IVC and hepatic veins, compatible with elevated right heart pressures. 3. Chronic bronchitis.  4. Emphysema. Given patient age, consider evaluation for low-dose CT lung cancer screening.   Electronically signed by: Norman Gatlin MD 10/20/2024 02:49 PM EST RP Workstation: [id number]      ASSESSMENT/PLAN     Acute exacerbation of COPD- RESOLVED   - continue nebulizer therapy Duoneb 16h prn   - s/p solumedrol 125x1 followed by 40mg  IV daily -wean to pred  -adding zithromax  PO - PT/OT -Incentive spirometry and flutter valve    Mucus plugging of tracheobronchial tree-RESOLVED     - considering outpatient bronchoscopy    Tobacco dependence   - 14mg  transdermal patch     Enlarged Right ventricle Suggestive of pulmonary hypertension - would recommend RHC via cardiology service when possible non urgently    Psychiatric history   Patient is currently being evaluated by psychiatrist     Chronic hypoxemia    - patient is using very long cordage over 109ft which makes oxygen requirement innaccurate.  Recommend ambient air walk for proper measurement. He does have advanced emphysema           Thank you for allowing me to participate in the care of this patient.   Patient/Family are satisfied with care plan and all questions have been answered.    Provider disclosure: Patient with at least one acute or chronic illness or injury that poses a  threat to life or bodily function and is being managed actively during this encounter.  All of the below services have been performed independently by signing provider:  review of prior documentation from internal and or external health records.  Review of previous and current lab results.  Interview and comprehensive assessment during patient visit today. Review of current and previous chest radiographs/CT scans. Discussion of management and test interpretation with health care team and patient/family.   This document was prepared using Dragon voice recognition software and may include unintentional dictation errors.     Niranjan Rufener, M.D.  Division of Pulmonary & Critical Care Medicine

## 2024-10-28 NOTE — Progress Notes (Signed)
 Mobility Specialist - Progress Note  Pre-mobility: HR-88,SpO2-94%  During mobility: HR-102, SpO2-92%  Post-mobility: HR-90, SPO2-93%   10/28/24 1400  Mobility  Activity Ambulated with assistance;Stood at bedside;Dangled on edge of bed  Level of Assistance Independent after set-up  Assistive Device  (O2 Tank)  Distance Ambulated (ft) 200 ft  Range of Motion/Exercises Active  Activity Response Tolerated well  Mobility visit 1 Mobility  Mobility Specialist Start Time (ACUTE ONLY) 1239  Mobility Specialist Stop Time (ACUTE ONLY) 1253  Mobility Specialist Time Calculation (min) (ACUTE ONLY) 14 min   Pt was supine in bed with HOB elevated on O2 @ 3L. Pt agreed to mobility. Pt O2 vitals were taken throughout activity as a precaution. Pt is able today to get to the EOB independently with no bed features. Pt is able to STS independently with no AD. Pt ambulated well. Pt  did use recovery breaks to check vitals. Pt O2 didn't drop below 89% throughout activity today. After activity pt returned to room in bed with needs in reach.  Clem Rodes Mobility Specialist 10/28/24, 2:28 PM

## 2024-10-28 NOTE — Progress Notes (Signed)
 Progress Note   Patient: Corey House. FMW:[id number] DOB: [birth year] DOA: 10/20/2024     8 DOS: the patient was seen and examined on 10/28/2024   Brief hospital course: 58 y.o. year old male with past medical history of  tobacco use disorder reporting a 41 year pack per year history, smoking ~1 pack per day since he was 58 years old. He denies any additional medical history though he has not seen a medical provider in ~20 years. He presents to [facility] ED with shortness of breath that began ~ 9 months ago and worsened ~3 months ago. He reports today his dyspnea is not significantly different than it was 3 months ago. He endorses a chronic cough that has been present since childhood. Her ports the cough is currently, minimally productive with white phlegm. Additionally, he endorses bilateral lower extremity swelling that began in the last 2 days, per his report (R) > (L). On exam the swelling is equal bilaterally. He endorses fatigue and dyspnea that is worse with position changes such as bending over and when standing from lying. He denies chest pain, palpitations, or fever.   ED Course: On arrival to [facility] ED patient was noted to be afebrile temp 36.8C, BP 142/111, HR 88, RR 22, SPO2 100% on 15L non-rebreather. Patient now on 2L via nasal cannula with SPO2 96%. CXR obtained and shows non specific findings of increased interstitial markings. CTA PE obtained and shows no PE, dilated RV and reflux of contract into the IVC and hepatic vein consistent with (R) heart failure, chronic bronchitis, and emphysema. US  DVT (B)LE negative for DVT. Labs notable Negative COVID, flu, RSV,for BNP 284.2, BUN 24, troponin 41--> 38 with no report of chest pain.  Lactic acid 0.9.  Blood cultures drawn and in process.  He was given Lasix, DuoNebs, and Solu-Medrol .  TRH contacted for admission.  11/5.  Pulse ox with ambulation down to 85%.  Patient admitted with COPD and CHF exacerbation.  Patient  also states that he is short of breath and he is waiting to die.  Appreciate psychiatric consultation.  Patient is homeless. 11/6.  Patient states that he has been sleepy.  Venous blood gas shows a pCO2 of 79 with a pH of 7.41.  Will start BiPAP at night.  Try to see if we can taper off oxygen. 11/7.  Patient hypoxic with walking around with pulmonary to 87%. 11/8.  Patient's pulse ox dropped down to 84% with ambulation. 11/9.  Patient had a pulse ox of 78% on room air at rest but he stated he walked back from the bathroom. 11/10.  Pulse ox in the low 80s with ambulation.  Likely will need chronic oxygen.  Assessment and Plan: * Acute respiratory failure with hypoxia and hypercapnia (HCC) Pulse ox dropped to 82% with ambulation 11/10.  The patient is unhomed and will need to be able to come off oxygen prior to disposition.  Continue to try to see if we can get off oxygen on a daily basis.  VBG shows a pCO2 of 81.  Patient unable to tolerate BiPAP.  The patient likely has acute on chronic respiratory failure.  -Will follow up amb O2 sats in the AM   COPD with acute exacerbation (HCC) Patient on Zithromax , on long prednisone  taper and nebulizer treatments.  Patient seen by pulmonary.  Acute on chronic diastolic CHF (congestive heart failure) (HCC) EF 55 to 60%.  Initially on IV Lasix.  On 11/6 and gave a  dose of acetazolamide with CO2 elevation on chemistry.  Hold Lasix today with CO2 on chemistry up at 38.  CT scan commenting on enlarged RV.  No current signs of heart failure. Will need further investigation while inpatient.   Tobacco use disorder Must stop smoking.  Headache Improved.  Tylenol  as needed.  IV magnesium  given on 11/5.  Increased anion gap metabolic acidosis Resolved  Hypernatremia Resolved  Depression Patient on Lexapro  and Seroquel  at night  Protein-calorie malnutrition, severe Continue supplements        Subjective: no issues ON.   Physical Exam: Vitals:    10/28/24 0420 10/28/24 0432 10/28/24 0801 10/28/24 1618  BP: 117/80  120/89 114/74  Pulse: 68  77 78  Resp: 20  16 16   Temp: 98.2 F (36.8 C)  98.7 F (37.1 C) 99 F (37.2 C)  TempSrc:   Oral Oral  SpO2: 99%  100% 98%  Weight:  53.1 kg    Height:       Physical Exam  Constitutional: In no distress.  Cardiovascular: Normal rate, regular rhythm. No lower extremity edema  Pulmonary: Non labored breathing on [place], no wheezing or rales.   Abdominal: Soft. Non distended and non tender Musculoskeletal: Normal range of motion.     Neurological: Alert and oriented to person, place, and time. Non focal  Skin: Skin is warm and dry.    Data Reviewed:    Latest Ref Rng & Units 10/28/2024    4:38 AM 10/26/2024    4:41 AM 10/21/2024    5:23 AM  CBC  WBC 4.0 - 10.5 K/uL 6.6  5.7  3.3   Hemoglobin 13.0 - 17.0 g/dL 85.0  84.7  85.1   Hematocrit 39.0 - 52.0 % 46.8  48.7  46.7   Platelets 150 - 400 K/uL 286  287  251       Latest Ref Rng & Units 10/28/2024    4:38 AM 10/26/2024    4:41 AM 10/23/2024    6:21 AM  BMP  Glucose 70 - 99 mg/dL 97  94  94   BUN 6 - 20 mg/dL 23  27  24    Creatinine 0.61 - 1.24 mg/dL 9.33  9.42  9.19   Sodium 135 - 145 mmol/L 143  141  140   Potassium 3.5 - 5.1 mmol/L 4.5  4.6  4.0   Chloride 98 - 111 mmol/L 96  93  99   CO2 22 - 32 mmol/L 41  38  32   Calcium 8.9 - 10.3 mg/dL 9.2  8.7  8.9      Disposition: Status is: Inpatient Remains inpatient appropriate because: Since patient is homeless and currently unable to get off oxygen we are in a tough situation.  We do not have a safe disposition  Planned Discharge Destination: Will need some sort of placement    Time spent: 27 minutes  Author: Alban Pepper, MD 10/28/2024 6:56 PM  For on call review [email or web address].

## 2024-10-28 NOTE — Plan of Care (Signed)
  Problem: Education: Goal: Knowledge of General Education information will improve Description: Including pain rating scale, medication(s)/side effects and non-pharmacologic comfort measures Outcome: Progressing   Problem: Health Behavior/Discharge Planning: Goal: Ability to manage health-related needs will improve Outcome: Progressing   Problem: Clinical Measurements: Goal: Ability to maintain clinical measurements within normal limits will improve Outcome: Progressing Goal: Will remain free from infection Outcome: Progressing Goal: Diagnostic test results will improve Outcome: Progressing Goal: Respiratory complications will improve Outcome: Not Progressing Goal: Cardiovascular complication will be avoided Outcome: Progressing   Problem: Activity: Goal: Risk for activity intolerance will decrease Outcome: Not Progressing   Problem: Nutrition: Goal: Adequate nutrition will be maintained Outcome: Progressing   Problem: Coping: Goal: Level of anxiety will decrease Outcome: Progressing   Problem: Elimination: Goal: Will not experience complications related to bowel motility Outcome: Progressing Goal: Will not experience complications related to urinary retention Outcome: Progressing   Problem: Pain Managment: Goal: General experience of comfort will improve and/or be controlled Outcome: Progressing   Problem: Safety: Goal: Ability to remain free from injury will improve Outcome: Progressing   Problem: Skin Integrity: Goal: Risk for impaired skin integrity will decrease Outcome: Progressing   Problem: Education: Goal: Knowledge of disease or condition will improve Outcome: Progressing Goal: Knowledge of the prescribed therapeutic regimen will improve Outcome: Progressing Goal: Individualized Educational Video(s) Outcome: Progressing   Problem: Activity: Goal: Ability to tolerate increased activity will improve Outcome: Progressing Goal: Will verbalize the  importance of balancing activity with adequate rest periods Outcome: Progressing   Problem: Respiratory: Goal: Ability to maintain a clear airway will improve Outcome: Progressing Goal: Levels of oxygenation will improve Outcome: Progressing Goal: Ability to maintain adequate ventilation will improve Outcome: Progressing

## 2024-10-28 NOTE — Progress Notes (Signed)
 Mobility Specialist - Progress Note  Pre-mobility: HR-89, SpO2-98%                          On Room Air 90 feet During mobility: HR-104, SpO2-92%                     HR-109 SpO2-89% Recovered to 91% < 30 sec. Post-mobility: HR-104,  SPO2-94%                       HR-112, SpO2- 92%    10/28/24 1700  Mobility  Activity Ambulated with assistance;Stood at bedside  Level of Assistance Independent after set-up  Assistive Device  (O2 Tank)  Distance Ambulated (ft) 210 ft  Range of Motion/Exercises Active  Activity Response Tolerated well  Mobility visit 1 Mobility  Mobility Specialist Start Time (ACUTE ONLY) 1650  Mobility Specialist Stop Time (ACUTE ONLY) 1700  Mobility Specialist Time Calculation (min) (ACUTE ONLY) 10 min   Pt was supine in bed on O2 @ 3L upon entry. Pt agreed to another mobility. O2 vitals were taken throughout activity as a precaution. Pt is able today to ambulate half on O2 and half off of O2 to check progression. Pt ambulated well. Pt didn't discuss of visually show discomfort. After activity pt returned to room in bed with needs in reach.    Clem Rodes Mobility Specialist 10/28/24, 6:01 PM

## 2024-10-28 NOTE — TOC Progression Note (Addendum)
 Transition of Care (TOC) - Progression Note    Patient Details  Name: Corey House. MRN: [id number] Date of Birth: 1966/02/06  [facility] CM/SW Contact  Daved JONETTA Hamilton, RN Phone Number: 10/28/2024, 9:10 AM  Clinical Narrative:     Per chart review:   Disposition: Status is: Inpatient Remains inpatient appropriate because: Since patient is homeless and currently unable to get off oxygen we are in a tough situation.  We do not have a safe disposition Planned Discharge Destination: Will need some sort of placement   9:05a - TOC spoke with [employer] rep Velvia regarding housing referral. Per Velvia housing referral was sent out, no update at this time. Velvia advised that any housing assistance will require patient to have some sort of income. Velvia also advised patient will need identification as the last update she received is that patient does not have physical ID/SS card. Velvia also advised that the patients [employer] Case Manager is scheduled to see the patient in the hospital on Monday, November 02, 2024   TOC to follow up with finance for possible update on SSI application.    Expected Discharge Plan:  (patient is currently homeless, unsure of discharge location at this time) Barriers to Discharge: Continued Medical Work up, Other (must enter comment) (homeless)               Expected Discharge Plan and Services   Discharge Planning Services: CM Consult   Living arrangements for the past 2 months: Homeless                                       Social Drivers of Health (SDOH) Interventions SDOH Screenings   Food Insecurity: Food Insecurity Present (10/20/2024)  Housing: High Risk (10/21/2024)  Transportation Needs: Unmet Transportation Needs (10/20/2024)  Utilities: At Risk (10/20/2024)  Alcohol Screen: Low Risk  (04/23/2024)  Social Connections: Unknown (04/23/2024)  Tobacco Use: High Risk (10/21/2024)    Readmission Risk Interventions      No data to display

## 2024-10-29 DIAGNOSIS — J9601 Acute respiratory failure with hypoxia: Secondary | ICD-10-CM | POA: Diagnosis not present

## 2024-10-29 DIAGNOSIS — J9602 Acute respiratory failure with hypercapnia: Secondary | ICD-10-CM | POA: Diagnosis not present

## 2024-10-29 LAB — BASIC METABOLIC PANEL WITH GFR
Anion gap: 5 (ref 5–15)
BUN: 20 mg/dL (ref 6–20)
CO2: 39 mmol/L — ABNORMAL HIGH (ref 22–32)
Calcium: 9 mg/dL (ref 8.9–10.3)
Chloride: 97 mmol/L — ABNORMAL LOW (ref 98–111)
Creatinine, Ser: 0.61 mg/dL (ref 0.61–1.24)
GFR, Estimated: >60 mL/min (ref >60)
Glucose, Bld: 86 mg/dL (ref 70–99)
Potassium: 4.2 mmol/L (ref 3.5–5.1)
Sodium: 141 mmol/L (ref 135–145)

## 2024-10-29 LAB — PRO BRAIN NATRIURETIC PEPTIDE: Pro Brain Natriuretic Peptide: <50 pg/mL (ref <300.0)

## 2024-10-29 NOTE — Progress Notes (Signed)
 PT Cancellation Note  Patient Details Name: Corey House. MRN: [id number] DOB: 27-Sep-1966   Cancelled Treatment:     Therapist in to see pt for progressive mobility. Pt declined any exercises or gait training stating he didn't feel like it. Pt educated on the benefits of frequent active mobility, however continued to decline. Will re-attempt next available date/time per POC.    Darice JAYSON Bohr 10/29/2024, 12:25 PM

## 2024-10-29 NOTE — Progress Notes (Signed)
 PULMONOLOGY         Date: 10/29/2024,   MRN# [id number] Corey House. 1966/04/07     AdmissionWeight: 58.8 kg                 CurrentWeight: 51.6 kg  Referring provider: Dr Josette   CHIEF COMPLAINT:   Acute exacerbation of CHF and COPD   HISTORY OF PRESENT ILLNESS   This is a 58 yo M with hx of chronic cough, COPD, CHF, Urolithiasis, tobacco dependence who came in due to worsening SOB.  [name] hypoxemic requiring HHFNC at 15L/min to reach normoxia.  Since admission he has partially improved with weaning down of O2 req.  He had CT chest with PE protocol and was found to have chronic bronchitic COPD and centrilobular emphysema with no venous thromboemoblism.  He also had noted RV dilation suggestive of RV failure.  Cardiac biomarkers were mildy elevated consisted with acute on chronic CHF. He had septic workup performed and had steroids with antibiotics prescribed. He also had DVT studies and this was negative for bilateral LE dvt.  Patient requested pulmonary evaluation and for this reason consultation was placed.   10/22/24- patient is improved, CXR repeating today. Preparing for dc home in AM. Plan to wean steroids and abx to PO regimen on outpatient basis.   10/23/24- patient is able to ambulate around hallway.  He did have very transient mild hypoxemia. He is improved quite a bit. We discussed smoking cessation.  He is concerned about his homelessness. His blood work is improved and blood gas is with chronic hypercapnic respiratory failure.   10/24/24- patient is resting in bed comfortably, reports expectoration of mucus this am and feels slightly better.  He was able to walk around hallway today. CXR is clear.  Plan to wean off oxygen and dc home when possible.  He's on zithromax  250 PO and prednisone  taper. He should use IS and flutter valve.   10/25/24- patient continues to desaturate while ambulating.  He drops his oxygen down to low 80s with slow  walking. Difficult situation with homelessness and needing oxygen therapy and possible BIPAP  10/26/24- patient reports feeling week and dyspneic in bed. He is working on obtaining placement.  He is with advanced COPD.   10/27/24- patient laying in bed in NAD.  His bloodwork is stable. He reports dyspnea at rest. He will need oxygen upon dc it seems due to recurrent hypoxemia.  10/28/24 - patient is laying in bed in do distress. He is on 3L/min [place].  He reports dyspnea at rest but does not appear to be using accessory muscles of respirations and is not tachypneic on examination. His lung auscultation is clear bilaterally.   10/29/24- patient reports anxiety while resting in bed this morning.  He asks for xanax during my interview.  Ive asked RN to perform exertional ambulation for oxygen requirement on room air this morning.  He appears to be resting in bed in no distress with no adventitious lung sounds on auscultation.  Hopefully can dc out of hospital on ambient air today  PAST MEDICAL HISTORY   Past Medical History:  Diagnosis Date   Chronic cough    Urolithiasis    Urolithiasis      SURGICAL HISTORY   Past Surgical History:  Procedure Laterality Date   CATARACT EXTRACTION W/PHACO Left 08/09/2021   Procedure: CATARACT EXTRACTION PHACO AND INTRAOCULAR LENS PLACEMENT (IOC) LEFT .051 00:10.0;  Surgeon: Mittie,  Dene, MD;  Location: [facility];  Service: Ophthalmology;  Laterality: Left;   CATARACT EXTRACTION W/PHACO Right 10/04/2021   Procedure: CATARACT EXTRACTION PHACO AND INTRAOCULAR LENS PLACEMENT (IOC) RIGHT 0.67 00:13.8;  Surgeon: Mittie Dene, MD;  Location: [facility];  Service: Ophthalmology;  Laterality: Right;  general anesthesia needs to be last case   HERNIA REPAIR     x2   MANDIBLE FRACTURE SURGERY       FAMILY HISTORY   History reviewed. No pertinent family history.   SOCIAL HISTORY   Social History   Tobacco Use   Smoking  status: Every Day    Current packs/day: 1.00    Average packs/day: 1 pack/day for 35.0 years (35.0 ttl pk-yrs)    Types: Cigarettes   Smokeless tobacco: Never   Tobacco comments:    Started smoking age 62  Vaping Use   Vaping status: Never Used  Substance Use Topics   Alcohol use: No   Drug use: Not Currently     MEDICATIONS    Home Medication:    Current Medication:  Current Facility-Administered Medications:    acetaminophen  (TYLENOL ) tablet 650 mg, 650 mg, Oral, Q6H PRN, Josette Ade, MD, 650 mg at 10/28/24 1303   albuterol  (PROVENTIL ) (2.5 MG/3ML) 0.083% nebulizer solution 2.5 mg, 2.5 mg, Nebulization, Q2H PRN, Foust, Katy L, NP, 2.5 mg at 10/23/24 9176   ALPRAZolam (XANAX) tablet 0.25 mg, 0.25 mg, Oral, TID PRN, Josette Ade, MD, 0.25 mg at 10/28/24 1837   artificial tears ophthalmic solution 1 drop, 1 drop, Both Eyes, PRN, Foust, Katy L, NP   azithromycin  (ZITHROMAX ) tablet 250 mg, 250 mg, Oral, Daily, Nicholi Ghuman, MD, 250 mg at 10/28/24 0848   enoxaparin  (LOVENOX ) injection 40 mg, 40 mg, Subcutaneous, Q24H, Foust, Katy L, NP, 40 mg at 10/28/24 1837   escitalopram  (LEXAPRO ) tablet 10 mg, 10 mg, Oral, Daily, Smith, Annie B, NP, 10 mg at 10/28/24 0848   guaiFENesin  (MUCINEX ) 12 hr tablet 600 mg, 600 mg, Oral, BID, Foust, Katy L, NP, 600 mg at 10/28/24 0849   melatonin tablet 5 mg, 5 mg, Oral, QHS PRN, Foust, Katy L, NP, 5 mg at 10/27/24 2253   multivitamin with minerals tablet 1 tablet, 1 tablet, Oral, Daily, Josette Ade, MD, 1 tablet at 10/28/24 0848   nicotine  (NICODERM CQ  - dosed in mg/24 hours) patch 14 mg, 14 mg, Transdermal, Daily, Foust, Katy L, NP, 14 mg at 10/28/24 0848   nicotine  polacrilex (NICORETTE) gum 2 mg, 2 mg, Oral, PRN, Foust, Katy L, NP   Oral care mouth rinse, 15 mL, Mouth Rinse, PRN, Josette, Richard, MD   pantoprazole (PROTONIX) EC tablet 40 mg, 40 mg, Oral, Daily, Foust, Katy L, NP, 40 mg at 10/28/24 0848   [COMPLETED] predniSONE   (DELTASONE ) tablet 50 mg, 50 mg, Oral, Q breakfast, 50 mg at 10/24/24 0841 **FOLLOWED BY** [COMPLETED] predniSONE  (DELTASONE ) tablet 45 mg, 45 mg, Oral, Q breakfast, 45 mg at 10/25/24 0930 **FOLLOWED BY** [COMPLETED] predniSONE  (DELTASONE ) tablet 40 mg, 40 mg, Oral, Q breakfast, 40 mg at 10/26/24 0945 **FOLLOWED BY** [COMPLETED] predniSONE  (DELTASONE ) tablet 35 mg, 35 mg, Oral, Q breakfast, 35 mg at 10/27/24 0959 **FOLLOWED BY** [COMPLETED] predniSONE  (DELTASONE ) tablet 30 mg, 30 mg, Oral, Q breakfast, 30 mg at 10/28/24 0849 **FOLLOWED BY** predniSONE  (DELTASONE ) tablet 25 mg, 25 mg, Oral, Q breakfast **FOLLOWED BY** [START ON 10/30/2024] predniSONE  (DELTASONE ) tablet 20 mg, 20 mg, Oral, Q breakfast **FOLLOWED BY** [START ON 10/31/2024] predniSONE  (DELTASONE ) tablet 15 mg, 15  mg, Oral, Q breakfast **FOLLOWED BY** [START ON 11/01/2024] predniSONE  (DELTASONE ) tablet 10 mg, 10 mg, Oral, Q breakfast **FOLLOWED BY** [START ON 11/02/2024] predniSONE  (DELTASONE ) tablet 5 mg, 5 mg, Oral, Q breakfast, Bexlee Bergdoll, MD   QUEtiapine  (SEROQUEL ) tablet 25 mg, 25 mg, Oral, QHS, Smith, Annie B, NP, 25 mg at 10/27/24 2253    ALLERGIES   Patient has no known allergies.     REVIEW OF SYSTEMS    Review of Systems:  Gen:  Denies  fever, sweats, chills weigh loss  HEENT: Denies blurred vision, double vision, ear pain, eye pain, hearing loss, nose bleeds, sore throat Cardiac:  No dizziness, chest pain or heaviness, chest tightness,edema Resp:   reports dyspnea chronically  Gi: Denies swallowing difficulty, stomach pain, nausea or vomiting, diarrhea, constipation, bowel incontinence Gu:  Denies bladder incontinence, burning urine Ext:   Denies Joint pain, stiffness or swelling Skin: Denies  skin rash, easy bruising or bleeding or hives Endoc:  Denies polyuria, polydipsia , polyphagia or weight change Psych:   Denies depression, insomnia or hallucinations   Other:  All other systems negative   VS: BP (!)  132/91 (BP Location: Left Arm)   Pulse 69   Temp 98 F (36.7 C) (Oral)   Resp 20   Ht 5' 4 (1.626 m)   Wt 51.6 kg   SpO2 100%   BMI 19.53 kg/m      PHYSICAL EXAM    GENERAL:NAD, no fevers, chills, no weakness no fatigue HEAD: Normocephalic, atraumatic.  EYES: Pupils equal, round, reactive to light. Extraocular muscles intact. No scleral icterus.  MOUTH: Moist mucosal membrane. Dentition intact. No abscess noted.  EAR, NOSE, THROAT: Clear without exudates. No external lesions.  NECK: Supple. No thyromegaly. No nodules. No JVD.  PULMONARY: decreased breath sounds clear to auscultation CARDIOVASCULAR: S1 and S2. Regular rate and rhythm. No murmurs, rubs, or gallops. No edema. Pedal pulses 2+ bilaterally.  GASTROINTESTINAL: Soft, nontender, nondistended. No masses. Positive bowel sounds. No hepatosplenomegaly.  MUSCULOSKELETAL: No swelling, clubbing, or edema. Range of motion full in all extremities.  NEUROLOGIC: Cranial nerves II through XII are intact. No gross focal neurological deficits. Sensation intact. Reflexes intact.  SKIN: No ulceration, lesions, rashes, or cyanosis. Skin warm and dry. Turgor intact.  PSYCHIATRIC: Mood, affect within normal limits. The patient is awake, alert and oriented x 3. Insight, judgment intact.       IMAGING     rative & Impression  EXAM: CTA of the Chest with contrast for PE 10/20/2024 02:19:01 PM   TECHNIQUE: CTA of the chest was performed without and with the administration of 75 mL of iohexol  (OMNIPAQUE ) 350 MG/ML injection. Multiplanar reformatted images are provided for review. MIP images are provided for review. Automated exposure control, iterative reconstruction, and/or weight based adjustment of the mA/kV was utilized to reduce the radiation dose to as low as reasonably achievable.   COMPARISON: Same day x-ray and CTA chest 01/04/2023.   CLINICAL HISTORY: Pulmonary embolism (PE) suspected, high prob.   FINDINGS:    PULMONARY ARTERIES: Pulmonary arteries are adequately opacified for evaluation. Negative for pulmonary embolism. Main pulmonary artery is normal in caliber.   MEDIASTINUM: Dilated right ventricle with reflux of contrast into the IVC and hepatic veins compatible with elevated right heart pressures. There is no acute abnormality of the thoracic aorta.   LYMPH NODES: No mediastinal, hilar or axillary lymphadenopathy.   LUNGS AND PLEURA: Diffuse bronchial wall thickening. Mucous plugging in the lower lobes. Emphysema. No focal  consolidation or pulmonary edema. No pleural effusion or pneumothorax.   UPPER ABDOMEN: Limited images of the upper abdomen are unremarkable.   SOFT TISSUES AND BONES: No acute bone or soft tissue abnormality.   IMPRESSION: 1. No pulmonary embolism. 2. Dilated right ventricle with reflux of contrast into the IVC and hepatic veins, compatible with elevated right heart pressures. 3. Chronic bronchitis. 4. Emphysema. Given patient age, consider evaluation for low-dose CT lung cancer screening.   Electronically signed by: Norman Gatlin MD 10/20/2024 02:49 PM EST RP Workstation: [id number]      ASSESSMENT/PLAN     Acute exacerbation of COPD- RESOLVED   - continue nebulizer therapy Duoneb 16h prn   - s/p solumedrol 125x1 followed by 40mg  IV daily -wean to pred  -adding zithromax  PO - PT/OT -Incentive spirometry and flutter valve    Mucus plugging of tracheobronchial tree-RESOLVED     - considering outpatient bronchoscopy    Tobacco dependence   - 14mg  transdermal patch     Enlarged Right ventricle Suggestive of pulmonary hypertension - would recommend RHC via cardiology service when possible non urgently    Psychiatric history   Patient is currently being evaluated by psychiatrist     Chronic hypoxemia    - patient is using very long cordage over 41ft which makes oxygen requirement innaccurate.  Recommend ambient air walk for proper  measurement. He does have advanced emphysema           Thank you for allowing me to participate in the care of this patient.   Patient/Family are satisfied with care plan and all questions have been answered.    Provider disclosure: Patient with at least one acute or chronic illness or injury that poses a threat to life or bodily function and is being managed actively during this encounter.  All of the below services have been performed independently by signing provider:  review of prior documentation from internal and or external health records.  Review of previous and current lab results.  Interview and comprehensive assessment during patient visit today. Review of current and previous chest radiographs/CT scans. Discussion of management and test interpretation with health care team and patient/family.   This document was prepared using Dragon voice recognition software and may include unintentional dictation errors.     Salah Burlison, M.D.  Division of Pulmonary & Critical Care Medicine

## 2024-10-29 NOTE — Progress Notes (Signed)
SATURATION QUALIFICATIONS: (This note is used to comply with regulatory documentation for home oxygen)  Patient Saturations on Room Air at Rest = 95%  Patient Saturations on Room Air while Ambulating = >/=91%   

## 2024-10-29 NOTE — TOC Progression Note (Addendum)
 Transition of Care (TOC) - Progression Note    Patient Details  Name: Corey House. MRN: [id number] Date of Birth: [birth year]  Transition of Care [facility]) CM/SW Contact  Daved JONETTA Hamilton, RN Phone Number: 10/29/2024, 10:16 AM  Clinical Narrative:     DONALD pending LVL2, FL2 completed  TOC received [employer] approved SNF facilities, referrals sent to all facilities listed, awaiting bed offers. TOC will continue to follow.  UPDATE:  TOC informed by Finance that Denise with [facility] is scheduled to meet with patient on Monday 11/02/2024.  Expected Discharge Plan:  (patient is currently homeless, unsure of discharge location at this time) Barriers to Discharge: Continued Medical Work up, Other (must enter comment) (homeless)               Expected Discharge Plan and Services   Discharge Planning Services: CM Consult   Living arrangements for the past 2 months: Homeless                                       Social Drivers of Health (SDOH) Interventions SDOH Screenings   Food Insecurity: Food Insecurity Present (10/20/2024)  Housing: High Risk (10/21/2024)  Transportation Needs: Unmet Transportation Needs (10/20/2024)  Utilities: At Risk (10/20/2024)  Alcohol Screen: Low Risk  (04/23/2024)  Social Connections: Unknown (04/23/2024)  Tobacco Use: High Risk (10/21/2024)    Readmission Risk Interventions     No data to display

## 2024-10-29 NOTE — Progress Notes (Signed)
 Mobility Specialist - Progress Note  Pre-mobility: HR-95, SpO2- 98% (RA)  During mobility: HR-99, SpO2-87(RA) recovered with O2 in < 1 min 91%  Post-mobility: HR-98, SPO2-91% On O2   10/29/24 1600  Mobility  Activity Ambulated with assistance;Stood at bedside;Dangled on edge of bed  Level of Assistance Independent after set-up  Assistive Device  (O2 Tank)  Distance Ambulated (ft) 360 ft  Range of Motion/Exercises Active  Activity Response Tolerated fair  Mobility visit 1 Mobility  Mobility Specialist Start Time (ACUTE ONLY) 1555  Mobility Specialist Stop Time (ACUTE ONLY) 1618  Mobility Specialist Time Calculation (min) (ACUTE ONLY) 23 min   Pt was supine in bed on O2 @ 3L upon entry. Pt agreed to mobility. Pt O2 vitals were checked throughout activity for precaution. Pt is able today to get to the EOB independently without bed features. Pt is able today to STS independently without AD. Pt ambulated well on RA O2 initially. Pt O2 vitals Desat and recovered with O2 in less than 45 sec. After activity pt returned to the room in bed with O2 @ 3L and needs in reach.  Clem Rodes Mobility Specialist 10/29/24, 4:47 PM

## 2024-10-29 NOTE — [place] FL2 (Signed)
 [place]  MEDICAID [id number] LEVEL OF CARE FORM     IDENTIFICATION  Patient Name: Corey House. Birthdate: August 30, 1966 Sex: male Admission Date (Current Location): 10/20/2024  [facility] and [place] Number:  Chiropodist and Address:  [facility], [address], [place], [place] [address]      Provider Number: [id number]  Attending Physician Name and Address:  Franchot Novel, MD  Relative Name and Phone Number:  None    Current Level of Care: Hospital Recommended Level of Care: Skilled Nursing Facility Prior Approval Number:    Date Approved/Denied:   PASRR Number: Pending  Discharge Plan: SNF    Current Diagnoses: Patient Active Problem List   Diagnosis Date Noted   Acute on chronic diastolic CHF (congestive heart failure) (HCC) 10/21/2024   Headache 10/21/2024   Increased anion gap metabolic acidosis 10/21/2024   Hypernatremia 10/21/2024   Acute respiratory failure with hypoxia and hypercapnia (HCC) 10/20/2024   Anxiety state 04/23/2024   MDD (major depressive disorder), recurrent episode, severe (HCC) 04/23/2024   Buprenorphine  and naloxone  withdrawal (HCC) 12/31/2023   Depression 12/28/2023   Benzodiazepine abuse (HCC) 12/28/2023   Suicidal ideations 12/28/2023   Major depressive disorder, recurrent episode, moderate (HCC) 01/04/2023   Pressure injury of skin 01/04/2023   Protein-calorie malnutrition, severe 01/04/2023   COPD with acute exacerbation (HCC) 01/03/2023   Tobacco use disorder 01/03/2023   Acute respiratory failure with hypoxia (HCC) 01/03/2023   Cough 01/03/2023    Orientation RESPIRATION BLADDER Height & Weight     Self, Time, Situation, Place  O2 Continent Weight: 51.6 kg Height:  5' 4 (162.6 cm)  BEHAVIORAL SYMPTOMS/MOOD NEUROLOGICAL BOWEL NUTRITION STATUS      Continent Diet (2 gram sodium)  AMBULATORY STATUS COMMUNICATION OF NEEDS Skin   Limited Assist Verbally Normal                        Personal Care Assistance Level of Assistance  Bathing, Dressing Bathing Assistance: Limited assistance   Dressing Assistance: Limited assistance     Functional Limitations Info             SPECIAL CARE FACTORS FREQUENCY                       Contractures Contractures Info: Not present    Additional Factors Info  Code Status, Allergies Code Status Info: Full Allergies Info: no known allergies           Current Medications (10/29/2024):  This is the current hospital active medication list Current Facility-Administered Medications  Medication Dose Route Frequency Provider Last Rate Last Admin   acetaminophen  (TYLENOL ) tablet 650 mg  650 mg Oral Q6H PRN Josette Ade, MD   650 mg at 10/29/24 9171   albuterol  (PROVENTIL ) (2.5 MG/3ML) 0.083% nebulizer solution 2.5 mg  2.5 mg Nebulization Q2H PRN Foust, Katy L, NP   2.5 mg at 10/23/24 9176   ALPRAZolam (XANAX) tablet 0.25 mg  0.25 mg Oral TID PRN Josette Ade, MD   0.25 mg at 10/29/24 0800   artificial tears ophthalmic solution 1 drop  1 drop Both Eyes PRN Foust, Katy L, NP       azithromycin  (ZITHROMAX ) tablet 250 mg  250 mg Oral Daily Aleskerov, Fuad, MD   250 mg at 10/29/24 0801   enoxaparin  (LOVENOX ) injection 40 mg  40 mg Subcutaneous Q24H Foust, Katy L, NP   40 mg at 10/28/24 1837  escitalopram  (LEXAPRO ) tablet 10 mg  10 mg Oral Daily Smith, Annie B, NP   10 mg at 10/29/24 9198   guaiFENesin  (MUCINEX ) 12 hr tablet 600 mg  600 mg Oral BID Foust, Katy L, NP   600 mg at 10/29/24 0801   melatonin tablet 5 mg  5 mg Oral QHS PRN Foust, Katy L, NP   5 mg at 10/27/24 2253   multivitamin with minerals tablet 1 tablet  1 tablet Oral Daily Josette Ade, MD   1 tablet at 10/29/24 0801   nicotine  (NICODERM CQ  - dosed in mg/24 hours) patch 14 mg  14 mg Transdermal Daily Foust, Katy L, NP   14 mg at 10/29/24 0801   nicotine  polacrilex (NICORETTE) gum 2 mg  2 mg Oral PRN Foust, Rockie CROME, NP       Oral care mouth rinse   15 mL Mouth Rinse PRN Wieting, Richard, MD       pantoprazole (PROTONIX) EC tablet 40 mg  40 mg Oral Daily Foust, Katy L, NP   40 mg at 10/29/24 0800   [START ON 10/30/2024] predniSONE  (DELTASONE ) tablet 20 mg  20 mg Oral Q breakfast Parris Manna, MD       Followed by   NOREEN ON 10/31/2024] predniSONE  (DELTASONE ) tablet 15 mg  15 mg Oral Q breakfast Aleskerov, Fuad, MD       Followed by   NOREEN ON 11/01/2024] predniSONE  (DELTASONE ) tablet 10 mg  10 mg Oral Q breakfast Aleskerov, Fuad, MD       Followed by   NOREEN ON 11/02/2024] predniSONE  (DELTASONE ) tablet 5 mg  5 mg Oral Q breakfast Aleskerov, Fuad, MD       QUEtiapine  (SEROQUEL ) tablet 25 mg  25 mg Oral QHS Smith, Annie B, NP   25 mg at 10/27/24 2253     Discharge Medications: Please see discharge summary for a list of discharge medications.  Relevant Imaging Results:  Relevant Lab Results:   Additional Information SS# [id number]  Daved JONETTA Hamilton, RN

## 2024-10-29 NOTE — TOC PASRR Note (Signed)
 Date of Birth: Aug 07, 1966 Date: 10/29/2024     To Whom It May Concern:   Please be advised that the above-named patient will require a short-term nursing home stay - anticipated 30 days or less for rehabilitation and strengthening.  The plan is for return home

## 2024-10-29 NOTE — Progress Notes (Signed)
 Progress Note   Patient: Corey House. FMW:[id number] DOB: 06-Mar-1966 DOA: 10/20/2024     9 DOS: the patient was seen and examined on 10/29/2024   Brief hospital course: 58 y.o. year old male with past medical history of  tobacco use disorder reporting a 41 year pack per year history, smoking ~1 pack per day since he was 58 years old. He denies any additional medical history though he has not seen a medical provider in ~20 years. He presents to [facility] ED with shortness of breath that began ~ 9 months ago and worsened ~3 months ago. He reports today his dyspnea is not significantly different than it was 3 months ago. He endorses a chronic cough that has been present since childhood. Her ports the cough is currently, minimally productive with white phlegm. Additionally, he endorses bilateral lower extremity swelling that began in the last 2 days, per his report (R) > (L). On exam the swelling is equal bilaterally. He endorses fatigue and dyspnea that is worse with position changes such as bending over and when standing from lying. He denies chest pain, palpitations, or fever.   ED Course: On arrival to [facility] ED patient was noted to be afebrile temp 36.8C, BP 142/111, HR 88, RR 22, SPO2 100% on 15L non-rebreather. Patient now on 2L via nasal cannula with SPO2 96%. CXR obtained and shows non specific findings of increased interstitial markings. CTA PE obtained and shows no PE, dilated RV and reflux of contract into the IVC and hepatic vein consistent with (R) heart failure, chronic bronchitis, and emphysema. US  DVT (B)LE negative for DVT. Labs notable Negative COVID, flu, RSV,for BNP 284.2, BUN 24, troponin 41--> 38 with no report of chest pain.  Lactic acid 0.9.  Blood cultures drawn and in process.  He was given Lasix, DuoNebs, and Solu-Medrol .  TRH contacted for admission.  11/5.  Pulse ox with ambulation down to 85%.  Patient admitted with COPD and CHF exacerbation.  Patient  also states that he is short of breath and he is waiting to die.  Appreciate psychiatric consultation.  Patient is homeless. 11/6.  Patient states that he has been sleepy.  Venous blood gas shows a pCO2 of 79 with a pH of 7.41.  Will start BiPAP at night.  Try to see if we can taper off oxygen. 11/7.  Patient hypoxic with walking around with pulmonary to 87%. 11/8.  Patient's pulse ox dropped down to 84% with ambulation. 11/9.  Patient had a pulse ox of 78% on room air at rest but he stated he walked back from the bathroom. 11/10.  Pulse ox in the low 80s with ambulation.  Likely will need chronic oxygen.  Assessment and Plan: * Acute respiratory failure with hypoxia and hypercapnia (HCC) Pulse ox dropped to 82% with ambulation 11/10.  Patient had ambulatory O2 sats this p.m. he was initially noted to maintain oxygen saturation 91% with ambulation, however patient was walked again and his O2 sats were in the mid 80s.  The patient is unhomed and will need to be able to come off oxygen prior to disposition. VBG shows a pCO2 of 81.  Patient unable to tolerate BiPAP.  The patient likely has acute on chronic respiratory failure.  - Will remove patient's oxygen in the a.m. and have him ambulate as well.  Will then check ABG  COPD with acute exacerbation (HCC) Patient on Zithromax , on long prednisone  taper and nebulizer treatments.  Patient seen by pulmonary.  Acute on chronic diastolic CHF (congestive heart failure) (HCC) EF 55 to 60%.  Initially on IV Lasix.  On 11/6 and gave a dose of acetazolamide with CO2 elevation on chemistry. CT scan commenting on enlarged RV.  No current signs of heart failure. -Will hold on further diuresis for now -May require right heart cath prior to discharge  Tobacco use disorder Discussed smoking cessation  Headache Resolved Increased anion gap metabolic acidosis Resolved Hypernatremia Resolved  Depression Patient on Lexapro  and Seroquel  at  night  Protein-calorie malnutrition, severe Continue supplements        Subjective: No acute issues overnight.  Physical Exam: Vitals:   10/28/24 1946 10/29/24 0413 10/29/24 0438 10/29/24 0858  BP: 124/79 (!) 132/91  130/88  Pulse: 83 69  78  Resp: 20 20  19   Temp: 98.4 F (36.9 C) 98 F (36.7 C)  98.7 F (37.1 C)  TempSrc:  Oral  Oral  SpO2: 97% 100%  100%  Weight:   51.6 kg   Height:       Physical Exam  Constitutional: In no distress.  Cardiovascular: Normal rate, regular rhythm. No lower extremity edema  Pulmonary: Non labored breathing on room air, no wheezing or rales.   Abdominal: Soft. Non distended and non tender Musculoskeletal: Normal range of motion.     Neurological: Alert and oriented to person, place, and time. Non focal  Skin: Skin is warm and dry.     Data Reviewed:    Latest Ref Rng & Units 10/28/2024    4:38 AM 10/26/2024    4:41 AM 10/21/2024    5:23 AM  CBC  WBC 4.0 - 10.5 K/uL 6.6  5.7  3.3   Hemoglobin 13.0 - 17.0 g/dL 85.0  84.7  85.1   Hematocrit 39.0 - 52.0 % 46.8  48.7  46.7   Platelets 150 - 400 K/uL 286  287  251       Latest Ref Rng & Units 10/29/2024    6:11 AM 10/28/2024    4:38 AM 10/26/2024    4:41 AM  BMP  Glucose 70 - 99 mg/dL 86  97  94   BUN 6 - 20 mg/dL 20  23  27    Creatinine 0.61 - 1.24 mg/dL 9.38  9.33  9.42   Sodium 135 - 145 mmol/L 141  143  141   Potassium 3.5 - 5.1 mmol/L 4.2  4.5  4.6   Chloride 98 - 111 mmol/L 97  96  93   CO2 22 - 32 mmol/L 39  41  38   Calcium 8.9 - 10.3 mg/dL 9.0  9.2  8.7      Disposition: Status is: Inpatient Remains inpatient appropriate because: Since patient is homeless and currently unable to get off oxygen we are in a tough situation.  We do not have a safe disposition  Planned Discharge Destination: Will need some sort of placement    Time spent: 35 minutes  Author: Alban Pepper, MD 10/29/2024 10:25 AM  For on call review [email or web address].

## 2024-10-30 DIAGNOSIS — J9602 Acute respiratory failure with hypercapnia: Secondary | ICD-10-CM | POA: Diagnosis not present

## 2024-10-30 DIAGNOSIS — J9601 Acute respiratory failure with hypoxia: Secondary | ICD-10-CM | POA: Diagnosis not present

## 2024-10-30 LAB — BLOOD GAS, ARTERIAL
Acid-Base Excess: 7.9 mmol/L — ABNORMAL HIGH (ref 0.0–2.0)
Bicarbonate: 35.8 mmol/L — ABNORMAL HIGH (ref 20.0–28.0)
O2 Content: 21 L/min
O2 Saturation: 89.9 %
Patient temperature: 37
pCO2 arterial: 62 mmHg — ABNORMAL HIGH (ref 32–48)
pH, Arterial: 7.37 (ref 7.35–7.45)
pO2, Arterial: 59 mmHg — ABNORMAL LOW (ref 83–108)

## 2024-10-30 NOTE — Progress Notes (Signed)
 PULMONOLOGY         Date: 10/30/2024,   MRN# [id number] Corey House. 27-Jun-1966     AdmissionWeight: 58.8 kg                 CurrentWeight: 51.4 kg  Referring provider: Dr Josette   CHIEF COMPLAINT:   Acute exacerbation of CHF and COPD   HISTORY OF PRESENT ILLNESS   This is a 58 yo M with hx of chronic cough, COPD, CHF, Urolithiasis, tobacco dependence who came in due to worsening SOB.  [name] hypoxemic requiring HHFNC at 15L/min to reach normoxia.  Since admission he has partially improved with weaning down of O2 req.  He had CT chest with PE protocol and was found to have chronic bronchitic COPD and centrilobular emphysema with no venous thromboemoblism.  He also had noted RV dilation suggestive of RV failure.  Cardiac biomarkers were mildy elevated consisted with acute on chronic CHF. He had septic workup performed and had steroids with antibiotics prescribed. He also had DVT studies and this was negative for bilateral LE dvt.  Patient requested pulmonary evaluation and for this reason consultation was placed.   10/22/24- patient is improved, CXR repeating today. Preparing for dc home in AM. Plan to wean steroids and abx to PO regimen on outpatient basis.   10/23/24- patient is able to ambulate around hallway.  He did have very transient mild hypoxemia. He is improved quite a bit. We discussed smoking cessation.  He is concerned about his homelessness. His blood work is improved and blood gas is with chronic hypercapnic respiratory failure.   10/24/24- patient is resting in bed comfortably, reports expectoration of mucus this am and feels slightly better.  He was able to walk around hallway today. CXR is clear.  Plan to wean off oxygen and dc home when possible.  He's on zithromax  250 PO and prednisone  taper. He should use IS and flutter valve.   10/25/24- patient continues to desaturate while ambulating.  He drops his oxygen down to low 80s with slow  walking. Difficult situation with homelessness and needing oxygen therapy and possible BIPAP  10/26/24- patient reports feeling week and dyspneic in bed. He is working on obtaining placement.  He is with advanced COPD.   10/27/24- patient laying in bed in NAD.  His bloodwork is stable. He reports dyspnea at rest. He will need oxygen upon dc it seems due to recurrent hypoxemia.  10/28/24 - patient is laying in bed in do distress. He is on 3L/min [place].  He reports dyspnea at rest but does not appear to be using accessory muscles of respirations and is not tachypneic on examination. His lung auscultation is clear bilaterally.   10/29/24- patient reports anxiety while resting in bed this morning.  He asks for xanax during my interview.  Ive asked RN to perform exertional ambulation for oxygen requirement on room air this morning.  He appears to be resting in bed in no distress with no adventitious lung sounds on auscultation.  Hopefully can dc out of hospital on ambient air today  10/30/24- patient feels better.  We tried to ambulate on room air but he did have desaturation again. Overall he seems less dyspneic and reports able to ambulate further with less effort.  He has trouble with discharge due to homelessness and O2 needs  PAST MEDICAL HISTORY   Past Medical History:  Diagnosis Date   Chronic cough  Urolithiasis    Urolithiasis      SURGICAL HISTORY   Past Surgical History:  Procedure Laterality Date   CATARACT EXTRACTION W/PHACO Left 08/09/2021   Procedure: CATARACT EXTRACTION PHACO AND INTRAOCULAR LENS PLACEMENT (IOC) LEFT .051 00:10.0;  Surgeon: Mittie Gaskin, MD;  Location: [facility];  Service: Ophthalmology;  Laterality: Left;   CATARACT EXTRACTION W/PHACO Right 10/04/2021   Procedure: CATARACT EXTRACTION PHACO AND INTRAOCULAR LENS PLACEMENT (IOC) RIGHT 0.67 00:13.8;  Surgeon: Mittie Gaskin, MD;  Location: [facility];  Service: Ophthalmology;   Laterality: Right;  general anesthesia needs to be last case   HERNIA REPAIR     x2   MANDIBLE FRACTURE SURGERY       FAMILY HISTORY   History reviewed. No pertinent family history.   SOCIAL HISTORY   Social History   Tobacco Use   Smoking status: Every Day    Current packs/day: 1.00    Average packs/day: 1 pack/day for 35.0 years (35.0 ttl pk-yrs)    Types: Cigarettes   Smokeless tobacco: Never   Tobacco comments:    Started smoking age 28  Vaping Use   Vaping status: Never Used  Substance Use Topics   Alcohol use: No   Drug use: Not Currently     MEDICATIONS    Home Medication:    Current Medication:  Current Facility-Administered Medications:    acetaminophen  (TYLENOL ) tablet 650 mg, 650 mg, Oral, Q6H PRN, Josette Ade, MD, 650 mg at 10/29/24 2129   albuterol  (PROVENTIL ) (2.5 MG/3ML) 0.083% nebulizer solution 2.5 mg, 2.5 mg, Nebulization, Q2H PRN, Foust, Katy L, NP, 2.5 mg at 10/23/24 9176   ALPRAZolam (XANAX) tablet 0.25 mg, 0.25 mg, Oral, TID PRN, Josette Ade, MD, 0.25 mg at 10/30/24 0817   artificial tears ophthalmic solution 1 drop, 1 drop, Both Eyes, PRN, Foust, Katy L, NP   azithromycin  (ZITHROMAX ) tablet 250 mg, 250 mg, Oral, Daily, Kadin Bera, MD, 250 mg at 10/30/24 0817   enoxaparin  (LOVENOX ) injection 40 mg, 40 mg, Subcutaneous, Q24H, Foust, Katy L, NP, 40 mg at 10/29/24 1746   escitalopram  (LEXAPRO ) tablet 10 mg, 10 mg, Oral, Daily, Smith, Annie B, NP, 10 mg at 10/30/24 9182   guaiFENesin  (MUCINEX ) 12 hr tablet 600 mg, 600 mg, Oral, BID, Foust, Katy L, NP, 600 mg at 10/30/24 0818   melatonin tablet 5 mg, 5 mg, Oral, QHS PRN, Foust, Katy L, NP, 5 mg at 10/29/24 2129   multivitamin with minerals tablet 1 tablet, 1 tablet, Oral, Daily, Josette Ade, MD, 1 tablet at 10/30/24 9182   nicotine  (NICODERM CQ  - dosed in mg/24 hours) patch 14 mg, 14 mg, Transdermal, Daily, Foust, Katy L, NP, 14 mg at 10/30/24 0818   nicotine  polacrilex  (NICORETTE) gum 2 mg, 2 mg, Oral, PRN, Foust, Katy L, NP   Oral care mouth rinse, 15 mL, Mouth Rinse, PRN, Josette, Richard, MD   pantoprazole (PROTONIX) EC tablet 40 mg, 40 mg, Oral, Daily, Foust, Katy L, NP, 40 mg at 10/30/24 0818   [COMPLETED] predniSONE  (DELTASONE ) tablet 50 mg, 50 mg, Oral, Q breakfast, 50 mg at 10/24/24 0841 **FOLLOWED BY** [COMPLETED] predniSONE  (DELTASONE ) tablet 45 mg, 45 mg, Oral, Q breakfast, 45 mg at 10/25/24 0930 **FOLLOWED BY** [COMPLETED] predniSONE  (DELTASONE ) tablet 40 mg, 40 mg, Oral, Q breakfast, 40 mg at 10/26/24 0945 **FOLLOWED BY** [COMPLETED] predniSONE  (DELTASONE ) tablet 35 mg, 35 mg, Oral, Q breakfast, 35 mg at 10/27/24 0959 **FOLLOWED BY** [COMPLETED] predniSONE  (DELTASONE ) tablet 30 mg, 30 mg, Oral,  Q breakfast, 30 mg at 10/28/24 0849 **FOLLOWED BY** [COMPLETED] predniSONE  (DELTASONE ) tablet 25 mg, 25 mg, Oral, Q breakfast, 25 mg at 10/29/24 0800 **FOLLOWED BY** [COMPLETED] predniSONE  (DELTASONE ) tablet 20 mg, 20 mg, Oral, Q breakfast, 20 mg at 10/30/24 0818 **FOLLOWED BY** [START ON 10/31/2024] predniSONE  (DELTASONE ) tablet 15 mg, 15 mg, Oral, Q breakfast **FOLLOWED BY** [START ON 11/01/2024] predniSONE  (DELTASONE ) tablet 10 mg, 10 mg, Oral, Q breakfast **FOLLOWED BY** [START ON 11/02/2024] predniSONE  (DELTASONE ) tablet 5 mg, 5 mg, Oral, Q breakfast, Lucianne Smestad, MD   QUEtiapine  (SEROQUEL ) tablet 25 mg, 25 mg, Oral, QHS, Smith, Annie B, NP, 25 mg at 10/29/24 2129    ALLERGIES   Patient has no known allergies.     REVIEW OF SYSTEMS    Review of Systems:  Gen:  Denies  fever, sweats, chills weigh loss  HEENT: Denies blurred vision, double vision, ear pain, eye pain, hearing loss, nose bleeds, sore throat Cardiac:  No dizziness, chest pain or heaviness, chest tightness,edema Resp:   reports dyspnea chronically  Gi: Denies swallowing difficulty, stomach pain, nausea or vomiting, diarrhea, constipation, bowel incontinence Gu:  Denies bladder  incontinence, burning urine Ext:   Denies Joint pain, stiffness or swelling Skin: Denies  skin rash, easy bruising or bleeding or hives Endoc:  Denies polyuria, polydipsia , polyphagia or weight change Psych:   Denies depression, insomnia or hallucinations   Other:  All other systems negative   VS: BP 129/84 (BP Location: Left Arm)   Pulse 73   Temp 98.2 F (36.8 C) (Oral)   Resp 16   Ht 5' 4 (1.626 m)   Wt 51.4 kg   SpO2 (!) 88%   BMI 19.45 kg/m      PHYSICAL EXAM    GENERAL:NAD, no fevers, chills, no weakness no fatigue HEAD: Normocephalic, atraumatic.  EYES: Pupils equal, round, reactive to light. Extraocular muscles intact. No scleral icterus.  MOUTH: Moist mucosal membrane. Dentition intact. No abscess noted.  EAR, NOSE, THROAT: Clear without exudates. No external lesions.  NECK: Supple. No thyromegaly. No nodules. No JVD.  PULMONARY: decreased breath sounds clear to auscultation CARDIOVASCULAR: S1 and S2. Regular rate and rhythm. No murmurs, rubs, or gallops. No edema. Pedal pulses 2+ bilaterally.  GASTROINTESTINAL: Soft, nontender, nondistended. No masses. Positive bowel sounds. No hepatosplenomegaly.  MUSCULOSKELETAL: No swelling, clubbing, or edema. Range of motion full in all extremities.  NEUROLOGIC: Cranial nerves II through XII are intact. No gross focal neurological deficits. Sensation intact. Reflexes intact.  SKIN: No ulceration, lesions, rashes, or cyanosis. Skin warm and dry. Turgor intact.  PSYCHIATRIC: Mood, affect within normal limits. The patient is awake, alert and oriented x 3. Insight, judgment intact.       IMAGING     rative & Impression  EXAM: CTA of the Chest with contrast for PE 10/20/2024 02:19:01 PM   TECHNIQUE: CTA of the chest was performed without and with the administration of 75 mL of iohexol  (OMNIPAQUE ) 350 MG/ML injection. Multiplanar reformatted images are provided for review. MIP images are provided for review. Automated  exposure control, iterative reconstruction, and/or weight based adjustment of the mA/kV was utilized to reduce the radiation dose to as low as reasonably achievable.   COMPARISON: Same day x-ray and CTA chest 01/04/2023.   CLINICAL HISTORY: Pulmonary embolism (PE) suspected, high prob.   FINDINGS:   PULMONARY ARTERIES: Pulmonary arteries are adequately opacified for evaluation. Negative for pulmonary embolism. Main pulmonary artery is normal in caliber.   MEDIASTINUM: Dilated right  ventricle with reflux of contrast into the IVC and hepatic veins compatible with elevated right heart pressures. There is no acute abnormality of the thoracic aorta.   LYMPH NODES: No mediastinal, hilar or axillary lymphadenopathy.   LUNGS AND PLEURA: Diffuse bronchial wall thickening. Mucous plugging in the lower lobes. Emphysema. No focal consolidation or pulmonary edema. No pleural effusion or pneumothorax.   UPPER ABDOMEN: Limited images of the upper abdomen are unremarkable.   SOFT TISSUES AND BONES: No acute bone or soft tissue abnormality.   IMPRESSION: 1. No pulmonary embolism. 2. Dilated right ventricle with reflux of contrast into the IVC and hepatic veins, compatible with elevated right heart pressures. 3. Chronic bronchitis. 4. Emphysema. Given patient age, consider evaluation for low-dose CT lung cancer screening.   Electronically signed by: Norman Gatlin MD 10/20/2024 02:49 PM EST RP Workstation: [id number]      ASSESSMENT/PLAN     Acute exacerbation of COPD- RESOLVED   - continue nebulizer therapy Duoneb 16h prn   - s/p solumedrol 125x1 followed by 40mg  IV daily -wean to pred  -adding zithromax  PO - PT/OT -Incentive spirometry and flutter valve    Mucus plugging of tracheobronchial tree-RESOLVED     - considering outpatient bronchoscopy    Tobacco dependence   - 14mg  transdermal patch     Enlarged Right ventricle Suggestive of pulmonary hypertension -  would recommend RHC via cardiology service when possible non urgently    Psychiatric history   Patient is currently being evaluated by psychiatrist     Chronic hypoxemia    - patient is using very long cordage over 36ft which makes oxygen requirement innaccurate.  Recommend ambient air walk for proper measurement. He does have advanced emphysema           Thank you for allowing me to participate in the care of this patient.   Patient/Family are satisfied with care plan and all questions have been answered.    Provider disclosure: Patient with at least one acute or chronic illness or injury that poses a threat to life or bodily function and is being managed actively during this encounter.  All of the below services have been performed independently by signing provider:  review of prior documentation from internal and or external health records.  Review of previous and current lab results.  Interview and comprehensive assessment during patient visit today. Review of current and previous chest radiographs/CT scans. Discussion of management and test interpretation with health care team and patient/family.   This document was prepared using Dragon voice recognition software and may include unintentional dictation errors.     Anterio Scheel, M.D.  Division of Pulmonary & Critical Care Medicine

## 2024-10-30 NOTE — Progress Notes (Signed)
 Progress Note   Patient: Corey House. FMW:[id number] DOB: 1966/07/25 DOA: 10/20/2024     10 DOS: the patient was seen and examined on 10/30/2024   Brief hospital course: 58 y.o. year old male with past medical history of  tobacco use disorder reporting a 41 year pack per year history, smoking ~1 pack per day since he was 58 years old. He denies any additional medical history though he has not seen a medical provider in ~20 years. He presents to [facility] ED with shortness of breath that began ~ 9 months ago and worsened ~3 months ago. He reports today his dyspnea is not significantly different than it was 3 months ago. He endorses a chronic cough that has been present since childhood. Her ports the cough is currently, minimally productive with white phlegm. Additionally, he endorses bilateral lower extremity swelling that began in the last 2 days, per his report (R) > (L). On exam the swelling is equal bilaterally. He endorses fatigue and dyspnea that is worse with position changes such as bending over and when standing from lying. He denies chest pain, palpitations, or fever.   ED Course: On arrival to [facility] ED patient was noted to be afebrile temp 36.8C, BP 142/111, HR 88, RR 22, SPO2 100% on 15L non-rebreather. Patient now on 2L via nasal cannula with SPO2 96%. CXR obtained and shows non specific findings of increased interstitial markings. CTA PE obtained and shows no PE, dilated RV and reflux of contract into the IVC and hepatic vein consistent with (R) heart failure, chronic bronchitis, and emphysema. US  DVT (B)LE negative for DVT. Labs notable Negative COVID, flu, RSV,for BNP 284.2, BUN 24, troponin 41--> 38 with no report of chest pain.  Lactic acid 0.9.  Blood cultures drawn and in process.  He was given Lasix, DuoNebs, and Solu-Medrol .  TRH contacted for admission.  11/5.  Pulse ox with ambulation down to 85%.  Patient admitted with COPD and CHF exacerbation.  Patient  also states that he is short of breath and he is waiting to die.  Appreciate psychiatric consultation.  Patient is homeless. 11/6.  Patient states that he has been sleepy.  Venous blood gas shows a pCO2 of 79 with a pH of 7.41.  Will start BiPAP at night.  Try to see if we can taper off oxygen. 11/7.  Patient hypoxic with walking around with pulmonary to 87%. 11/8.  Patient's pulse ox dropped down to 84% with ambulation. 11/9.  Patient had a pulse ox of 78% on room air at rest but he stated he walked back from the bathroom. 11/10.  Pulse ox in the low 80s with ambulation.  Likely will need chronic oxygen.  Assessment and Plan: * Acute respiratory failure with hypoxia and hypercapnia (HCC) 11/14 patient working with occupational therapy and O2 sats on room air 85 to 88%. Patient's oxygen was left off for a couple of hours and his ABG showed pO2 of 59 mmHg and O2 sat of 87%.  T OC continues to work on disposition for patient given his oxygen requirement  COPD with acute exacerbation (HCC) Patient on Zithromax , on long prednisone  taper and nebulizer treatments.  Pulmonology is following inpatient  Acute on chronic diastolic CHF (congestive heart failure) (HCC) EF 55 to 60%.  Initially on IV Lasix. CT scan commenting on enlarged RV.  No current signs of heart failure. - Appears euvolemic, will hold on further diuresis -May require right heart cath prior to discharge  Tobacco use disorder Discussed smoking cessation, continue nicotine  patch  Headache Resolved Increased anion gap metabolic acidosis Resolved Hypernatremia Resolved  Depression Patient on Lexapro  and Seroquel  at night  Protein-calorie malnutrition, severe Continue supplements        Subjective: No issues overnight  Physical Exam: Vitals:   10/30/24 0500 10/30/24 0825 10/30/24 1028 10/30/24 1617  BP:  129/84  109/87  Pulse:  73  87  Resp:  16  16  Temp:  98.2 F (36.8 C)  98.2 F (36.8 C)  TempSrc:  Oral   Oral  SpO2:  100% (!) 88% 90%  Weight: 51.4 kg     Height:       Physical Exam  Constitutional: In no distress.  Cardiovascular: Normal rate, regular rhythm. No lower extremity edema  Pulmonary: Non labored breathing on room air, no wheezing or rales.   Abdominal: Soft. Non distended and non tender Musculoskeletal: Normal range of motion.     Neurological: Alert and oriented to person, place, and time. Non focal  Skin: Skin is warm and dry.      Data Reviewed:    Latest Ref Rng & Units 10/28/2024    4:38 AM 10/26/2024    4:41 AM 10/21/2024    5:23 AM  CBC  WBC 4.0 - 10.5 K/uL 6.6  5.7  3.3   Hemoglobin 13.0 - 17.0 g/dL 85.0  84.7  85.1   Hematocrit 39.0 - 52.0 % 46.8  48.7  46.7   Platelets 150 - 400 K/uL 286  287  251       Latest Ref Rng & Units 10/29/2024    6:11 AM 10/28/2024    4:38 AM 10/26/2024    4:41 AM  BMP  Glucose 70 - 99 mg/dL 86  97  94   BUN 6 - 20 mg/dL 20  23  27    Creatinine 0.61 - 1.24 mg/dL 9.38  9.33  9.42   Sodium 135 - 145 mmol/L 141  143  141   Potassium 3.5 - 5.1 mmol/L 4.2  4.5  4.6   Chloride 98 - 111 mmol/L 97  96  93   CO2 22 - 32 mmol/L 39  41  38   Calcium 8.9 - 10.3 mg/dL 9.0  9.2  8.7      Disposition: Status is: Inpatient Remains inpatient appropriate because: Since patient is homeless and currently unable to get off oxygen we are in a tough situation.  We do not have a safe disposition  Planned Discharge Destination: Will need some sort of placement    Time spent: 35 minutes  Author: Alban Pepper, MD 10/30/2024 6:28 PM  For on call review [email or web address].

## 2024-10-30 NOTE — TOC Progression Note (Signed)
 Transition of Care (TOC) - Progression Note    Patient Details  Name: Corey House. MRN: [id number] Date of Birth: 12-Jul-1966  Transition of Care [facility]) CM/SW Contact  Dalia GORMAN Fuse, RN Phone Number: 10/30/2024, 4:33 PM  Clinical Narrative:     Patient does not have any bed offers. See below for the list of faciliies INN with [facility] that have declined   TOC will continue to follow.  Expected Discharge Plan:  (patient is currently homeless, unsure of discharge location at this time) Barriers to Discharge: Continued Medical Work up, Other (must enter comment) (homeless)               Expected Discharge Plan and Services   Discharge Planning Services: CM Consult   Living arrangements for the past 2 months: Homeless                                       Social Drivers of Health (SDOH) Interventions SDOH Screenings   Food Insecurity: Food Insecurity Present (10/20/2024)  Housing: High Risk (10/21/2024)  Transportation Needs: Unmet Transportation Needs (10/20/2024)  Utilities: At Risk (10/20/2024)  Alcohol Screen: Low Risk  (04/23/2024)  Social Connections: Unknown (04/23/2024)  Tobacco Use: High Risk (10/21/2024)    Readmission Risk Interventions     No data to display

## 2024-10-30 NOTE — Progress Notes (Signed)
 Rounded on patient. Patient noted to have continued de-saturations with ambulation without use of oxygen.  Patient states he is feeling a little better. Still remains anxious about disposition plan. Continues to be grateful for all of the care and assist with SDOH needs while hospitalized.  Encouraged to call with questions or concerns.

## 2024-10-30 NOTE — Plan of Care (Signed)
 Patient had a good day overall. Performed walking assessment today off of oxygen and patient's oxygen saturation went down to 88%. Patient remains off of oxygen MD order to complete an ABG today. Patients states he is having mild anxiety.   MD aware.   Problem: Education: Goal: Knowledge of General Education information will improve Description: Including pain rating scale, medication(s)/side effects and non-pharmacologic comfort measures Outcome: Progressing   Problem: Health Behavior/Discharge Planning: Goal: Ability to manage health-related needs will improve Outcome: Progressing   Problem: Clinical Measurements: Goal: Ability to maintain clinical measurements within normal limits will improve Outcome: Progressing Goal: Will remain free from infection Outcome: Progressing Goal: Diagnostic test results will improve Outcome: Progressing Goal: Respiratory complications will improve Outcome: Progressing Goal: Cardiovascular complication will be avoided Outcome: Progressing   Problem: Activity: Goal: Risk for activity intolerance will decrease Outcome: Progressing   Problem: Nutrition: Goal: Adequate nutrition will be maintained Outcome: Progressing   Problem: Coping: Goal: Level of anxiety will decrease Outcome: Progressing   Problem: Elimination: Goal: Will not experience complications related to bowel motility Outcome: Progressing Goal: Will not experience complications related to urinary retention Outcome: Progressing   Problem: Pain Managment: Goal: General experience of comfort will improve and/or be controlled Outcome: Progressing   Problem: Safety: Goal: Ability to remain free from injury will improve Outcome: Progressing   Problem: Skin Integrity: Goal: Risk for impaired skin integrity will decrease Outcome: Progressing   Problem: Education: Goal: Knowledge of disease or condition will improve Outcome: Progressing Goal: Knowledge of the prescribed  therapeutic regimen will improve Outcome: Progressing Goal: Individualized Educational Video(s) Outcome: Progressing   Problem: Activity: Goal: Ability to tolerate increased activity will improve Outcome: Progressing Goal: Will verbalize the importance of balancing activity with adequate rest periods Outcome: Progressing   Problem: Respiratory: Goal: Ability to maintain a clear airway will improve Outcome: Progressing Goal: Levels of oxygenation will improve Outcome: Progressing Goal: Ability to maintain adequate ventilation will improve Outcome: Progressing

## 2024-10-30 NOTE — Progress Notes (Signed)
 Occupational Therapy Treatment Patient Details Name: Corey House. MRN: [id number] DOB: [birth year] Today's Date: 10/30/2024   History of present illness Pt is a 58 y.o. year old male with past medical history of tobacco use. He presents to [facility] ED with shortness of breath. He has dyspnea at rest and on exertion with a chronic cough that has been present since childhood. MD dx includes: Emphysema and hypoxia.   OT comments  Patient seen for OT treatment on this date. Upon arrival to room patient resting in bed on RA; O2 monitored throughout tx ranging from 84%-93%; only increased with cues for pursed lip breathing; patient with good return demo after demonstration. 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 Patient states he is ready to dc back to the streets OT had patient demonstrate that he could transition from standing to supine on the floor and return back to standing, patient was able to do, reports less DOE with this task than previously. Patient ended treatment in bed with all needs within reach. Patient making good progress toward goals, will continue to follow POC. Discharge recommendation remains appropriate.        If plan is discharge home, recommend the following:      Equipment Recommendations  None recommended by OT    Recommendations for Other Services      Precautions / Restrictions Precautions Precautions: Fall Recall of Precautions/Restrictions: Intact Restrictions Weight Bearing Restrictions Per Provider Order: No       Mobility Bed Mobility Overal bed mobility: Independent                  Transfers Overall transfer level: Modified independent                 General transfer comment: practiced going  from floor to standing to simulate dc disposition (homeless) patient able to lower/rise from floor without A     Balance                                           ADL either performed or assessed with clinical judgement   ADL Overall ADL's : Modified independent                                            Extremity/Trunk Assessment              Vision       Perception     Praxis     Communication Communication Communication: No apparent difficulties   Cognition Arousal: Alert Behavior During Therapy: WFL for tasks assessed/performed, Anxious                                 Following commands: Intact        Cueing      Exercises      Shoulder Instructions       General Comments      Pertinent Vitals/ Pain       Pain Assessment Pain Assessment: No/denies  pain  Home Living                                          Prior Functioning/Environment              Frequency  Min 1X/week        Progress Toward Goals  OT Goals(current goals can now be found in the care plan section)  Progress towards OT goals: Progressing toward goals  Acute Rehab OT Goals Patient Stated Goal: to get better OT Goal Formulation: With patient Time For Goal Achievement: 11/04/24 Potential to Achieve Goals: Good ADL Goals Pt Will Perform Grooming: with modified independence;sitting;standing Pt Will Perform Lower Body Dressing: with modified independence;sitting/lateral leans;sit to/from stand Pt Will Transfer to Toilet: with modified independence;ambulating Pt Will Perform Toileting - Clothing Manipulation and hygiene: with modified independence;sitting/lateral leans;sit to/from stand  Plan      Co-evaluation                 AM-PAC OT 6 Clicks Daily Activity     Outcome Measure   Help from another person eating meals?: None Help from another person taking care of personal grooming?:  None Help from another person toileting, which includes using toliet, bedpan, or urinal?: None Help from another person bathing (including washing, rinsing, drying)?: None Help from another person to put on and taking off regular upper body clothing?: None Help from another person to put on and taking off regular lower body clothing?: None 6 Click Score: 24    End of Session    OT Visit Diagnosis: Other abnormalities of gait and mobility (R26.89)   Activity Tolerance Patient tolerated treatment well   Patient Left in bed;with call bell/phone within reach   Nurse Communication Mobility status        Time: 8450-8392 OT Time Calculation (min): 18 min  Charges: OT General Charges $OT Visit: 1 Visit OT Treatments $Self Care/Home Management : 8-22 mins  Rogers Clause, OT/L MSOT, 10/30/2024

## 2024-10-31 DIAGNOSIS — J9601 Acute respiratory failure with hypoxia: Secondary | ICD-10-CM | POA: Diagnosis not present

## 2024-10-31 DIAGNOSIS — J9602 Acute respiratory failure with hypercapnia: Secondary | ICD-10-CM | POA: Diagnosis not present

## 2024-10-31 NOTE — Progress Notes (Signed)
 Progress Note   Patient: Corey House. FMW:[id number] DOB: [birth year] DOA: 10/20/2024     11 DOS: the patient was seen and examined on 10/31/2024   Brief hospital course: 58 y.o. year old male with past medical history of  tobacco use disorder reporting a 41 year pack per year history, smoking ~1 pack per day since he was 58 years old. He denies any additional medical history though he has not seen a medical provider in ~20 years. He presents to [facility] ED with shortness of breath that began ~ 9 months ago and worsened ~3 months ago. He reports today his dyspnea is not significantly different than it was 3 months ago. He endorses a chronic cough that has been present since childhood. Her ports the cough is currently, minimally productive with white phlegm. Additionally, he endorses bilateral lower extremity swelling that began in the last 2 days, per his report (R) > (L). On exam the swelling is equal bilaterally. He endorses fatigue and dyspnea that is worse with position changes such as bending over and when standing from lying. He denies chest pain, palpitations, or fever.   ED Course: On arrival to [facility] ED patient was noted to be afebrile temp 36.8C, BP 142/111, HR 88, RR 22, SPO2 100% on 15L non-rebreather. Patient now on 2L via nasal cannula with SPO2 96%. CXR obtained and shows non specific findings of increased interstitial markings. CTA PE obtained and shows no PE, dilated RV and reflux of contract into the IVC and hepatic vein consistent with (R) heart failure, chronic bronchitis, and emphysema. US  DVT (B)LE negative for DVT. Labs notable Negative COVID, flu, RSV,for BNP 284.2, BUN 24, troponin 41--> 38 with no report of chest pain.  Lactic acid 0.9.  Blood cultures drawn and in process.  He was given Lasix, DuoNebs, and Solu-Medrol .  TRH contacted for admission.  11/5.  Pulse ox with ambulation down to 85%.  Patient admitted with COPD and CHF exacerbation.  Patient  also states that he is short of breath and he is waiting to die.  Appreciate psychiatric consultation.  Patient is homeless. 11/6.  Patient states that he has been sleepy.  Venous blood gas shows a pCO2 of 79 with a pH of 7.41.  Will start BiPAP at night.  Try to see if we can taper off oxygen. 11/7.  Patient hypoxic with walking around with pulmonary to 87%. 11/8.  Patient's pulse ox dropped down to 84% with ambulation. 11/9.  Patient had a pulse ox of 78% on room air at rest but he stated he walked back from the bathroom. 11/10.  Pulse ox in the low 80s with ambulation.  Likely will need chronic oxygen.  Assessment and Plan: * Acute respiratory failure with hypoxia and hypercapnia (HCC) 11/14 patient working with occupational therapy and O2 sats on room air 85 to 88%. Patient's oxygen was left off for a couple of hours and his ABG showed pO2 of 59 mmHg and O2 sat of 87%.  TOC continues to work on disposition for patient given his oxygen requirement  COPD with acute exacerbation (HCC) Patient on Zithromax , on long prednisone  taper and nebulizer treatments.  Pulmonology is following inpatient  Acute on chronic diastolic CHF (congestive heart failure) (HCC) Resolved EF 55 to 60%.  Initially on IV Lasix. CT scan commenting on enlarged RV.  No current signs of heart failure. 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  -Does not appear volume overloaded -Continue to treat underlying cause  -May require right heart cath prior to discharge  Tobacco use disorder Discussed smoking cessation, continue nicotine  patch  Headache Resolved Increased anion gap metabolic acidosis Resolved Hypernatremia Resolved  Depression Patient on Lexapro  and Seroquel  at night  Protein-calorie malnutrition, severe Continue supplements        Subjective: No  issues overnight  Physical Exam: Vitals:   10/30/24 1934 10/31/24 0443 10/31/24 0500 10/31/24 0814  BP: 102/76 117/75  118/81  Pulse: 82 71  75  Resp: 16 16  16   Temp: 98.1 F (36.7 C) 98 F (36.7 C)  97.8 F (36.6 C)  TempSrc: Oral   Oral  SpO2: (!) 88% 93%  91%  Weight:   51.9 kg   Height:       Physical Exam  Constitutional: In no distress.  Cardiovascular: Normal rate, regular rhythm. No lower extremity edema  Pulmonary: Non labored breathing on room air, no wheezing or rales.   Abdominal: Soft. Non distended and non tender Musculoskeletal: Normal range of motion.     Neurological: Alert and oriented to person, place, and time. Non focal  Skin: Skin is warm and dry.       Data Reviewed:    Latest Ref Rng & Units 10/28/2024    4:38 AM 10/26/2024    4:41 AM 10/21/2024    5:23 AM  CBC  WBC 4.0 - 10.5 K/uL 6.6  5.7  3.3   Hemoglobin 13.0 - 17.0 g/dL 85.0  84.7  85.1   Hematocrit 39.0 - 52.0 % 46.8  48.7  46.7   Platelets 150 - 400 K/uL 286  287  251       Latest Ref Rng & Units 10/29/2024    6:11 AM 10/28/2024    4:38 AM 10/26/2024    4:41 AM  BMP  Glucose 70 - 99 mg/dL 86  97  94   BUN 6 - 20 mg/dL 20  23  27    Creatinine 0.61 - 1.24 mg/dL 9.38  9.33  9.42   Sodium 135 - 145 mmol/L 141  143  141   Potassium 3.5 - 5.1 mmol/L 4.2  4.5  4.6   Chloride 98 - 111 mmol/L 97  96  93   CO2 22 - 32 mmol/L 39  41  38   Calcium 8.9 - 10.3 mg/dL 9.0  9.2  8.7      Disposition: Status is: Inpatient Remains inpatient appropriate because: Since patient is unhomed and currently unable to get off oxygen we are in a tough situation.  We do not have a safe disposition  Planned Discharge Destination: Will need some sort of placement    Time spent: 35 minutes  Author: Alban Pepper, MD 10/31/2024 3:38 PM  For on call review [email or web address].

## 2024-10-31 NOTE — Plan of Care (Signed)
  Problem: Clinical Measurements: Goal: Ability to maintain clinical measurements within normal limits will improve Outcome: Progressing Goal: Diagnostic test results will improve Outcome: Progressing Goal: Respiratory complications will improve Outcome: Progressing   Problem: Nutrition: Goal: Adequate nutrition will be maintained Outcome: Progressing   Problem: Coping: Goal: Level of anxiety will decrease Outcome: Progressing

## 2024-10-31 NOTE — Progress Notes (Signed)
 Rounded on patient. Patient lying in bed, on RA, with no acute distress noted. Patient states he is agreeable to discharging as long as [his] breathing stays ok.  Patient states he has a meeting with his [facility] case manager on Monday. States if he discharges prior to that he will call her and arrange to meet her at the [employer] or somewhere of that nature. Patient aware he will need to follow up with both his new PCP and pulmonology.

## 2024-10-31 NOTE — Progress Notes (Signed)
 PULMONOLOGY         Date: 10/31/2024,   MRN# [id number] Corey House. 1966/06/29     AdmissionWeight: 58.8 kg                 CurrentWeight: 51.9 kg  Referring provider: Dr Josette   CHIEF COMPLAINT:   Acute exacerbation of CHF and COPD   HISTORY OF PRESENT ILLNESS   This is a 58 yo M with hx of chronic cough, COPD, CHF, Urolithiasis, tobacco dependence who came in due to worsening SOB.  [name] hypoxemic requiring HHFNC at 15L/min to reach normoxia.  Since admission he has partially improved with weaning down of O2 req.  He had CT chest with PE protocol and was found to have chronic bronchitic COPD and centrilobular emphysema with no venous thromboemoblism.  He also had noted RV dilation suggestive of RV failure.  Cardiac biomarkers were mildy elevated consisted with acute on chronic CHF. He had septic workup performed and had steroids with antibiotics prescribed. He also had DVT studies and this was negative for bilateral LE dvt.  Patient requested pulmonary evaluation and for this reason consultation was placed.   10/22/24- patient is improved, CXR repeating today. Preparing for dc home in AM. Plan to wean steroids and abx to PO regimen on outpatient basis.   10/23/24- patient is able to ambulate around hallway.  He did have very transient mild hypoxemia. He is improved quite a bit. We discussed smoking cessation.  He is concerned about his homelessness. His blood work is improved and blood gas is with chronic hypercapnic respiratory failure.   10/24/24- patient is resting in bed comfortably, reports expectoration of mucus this am and feels slightly better.  He was able to walk around hallway today. CXR is clear.  Plan to wean off oxygen and dc home when possible.  He's on zithromax  250 PO and prednisone  taper. He should use IS and flutter valve.   10/25/24- patient continues to desaturate while ambulating.  He drops his oxygen down to low 80s with slow  walking. Difficult situation with homelessness and needing oxygen therapy and possible BIPAP  10/26/24- patient reports feeling week and dyspneic in bed. He is working on obtaining placement.  He is with advanced COPD.   10/27/24- patient laying in bed in NAD.  His bloodwork is stable. He reports dyspnea at rest. He will need oxygen upon dc it seems due to recurrent hypoxemia.  10/28/24 - patient is laying in bed in do distress. He is on 3L/min [place].  He reports dyspnea at rest but does not appear to be using accessory muscles of respirations and is not tachypneic on examination. His lung auscultation is clear bilaterally.   10/29/24- patient reports anxiety while resting in bed this morning.  He asks for xanax during my interview.  Ive asked RN to perform exertional ambulation for oxygen requirement on room air this morning.  He appears to be resting in bed in no distress with no adventitious lung sounds on auscultation.  Hopefully can dc out of hospital on ambient air today  10/30/24- patient feels better.  We tried to ambulate on room air but he did have desaturation again. Overall he seems less dyspneic and reports able to ambulate further with less effort.  He has trouble with discharge due to homelessness and O2 needs  10/31/24- Patient states this morning, he wishes to be dcd.  He reports he would be agreeable to  leave without oxygen if he is able.  He has TOC team working with him for disability  coverage.  His hypoxemia is mild on exertion around 88%.  He reports if things get worse he can come back to ER but is agreeable to stay here as well.   PAST MEDICAL HISTORY   Past Medical History:  Diagnosis Date   Chronic cough    Urolithiasis    Urolithiasis      SURGICAL HISTORY   Past Surgical History:  Procedure Laterality Date   CATARACT EXTRACTION W/PHACO Left 08/09/2021   Procedure: CATARACT EXTRACTION PHACO AND INTRAOCULAR LENS PLACEMENT (IOC) LEFT .051 00:10.0;  Surgeon:  Mittie Gaskin, MD;  Location: [facility];  Service: Ophthalmology;  Laterality: Left;   CATARACT EXTRACTION W/PHACO Right 10/04/2021   Procedure: CATARACT EXTRACTION PHACO AND INTRAOCULAR LENS PLACEMENT (IOC) RIGHT 0.67 00:13.8;  Surgeon: Mittie Gaskin, MD;  Location: [facility];  Service: Ophthalmology;  Laterality: Right;  general anesthesia needs to be last case   HERNIA REPAIR     x2   MANDIBLE FRACTURE SURGERY       FAMILY HISTORY   History reviewed. No pertinent family history.   SOCIAL HISTORY   Social History   Tobacco Use   Smoking status: Every Day    Current packs/day: 1.00    Average packs/day: 1 pack/day for 35.0 years (35.0 ttl pk-yrs)    Types: Cigarettes   Smokeless tobacco: Never   Tobacco comments:    Started smoking age 49  Vaping Use   Vaping status: Never Used  Substance Use Topics   Alcohol use: No   Drug use: Not Currently     MEDICATIONS    Home Medication:    Current Medication:  Current Facility-Administered Medications:    acetaminophen  (TYLENOL ) tablet 650 mg, 650 mg, Oral, Q6H PRN, Josette Ade, MD, 650 mg at 10/30/24 1541   albuterol  (PROVENTIL ) (2.5 MG/3ML) 0.083% nebulizer solution 2.5 mg, 2.5 mg, Nebulization, Q2H PRN, Foust, Katy L, NP, 2.5 mg at 10/23/24 9176   ALPRAZolam (XANAX) tablet 0.25 mg, 0.25 mg, Oral, TID PRN, Josette Ade, MD, 0.25 mg at 10/31/24 0829   artificial tears ophthalmic solution 1 drop, 1 drop, Both Eyes, PRN, Foust, Katy L, NP   azithromycin  (ZITHROMAX ) tablet 250 mg, 250 mg, Oral, Daily, Jakhi Dishman, MD, 250 mg at 10/31/24 9170   enoxaparin  (LOVENOX ) injection 40 mg, 40 mg, Subcutaneous, Q24H, Foust, Katy L, NP, 40 mg at 10/30/24 1812   escitalopram  (LEXAPRO ) tablet 10 mg, 10 mg, Oral, Daily, Smith, Annie B, NP, 10 mg at 10/31/24 9171   guaiFENesin  (MUCINEX ) 12 hr tablet 600 mg, 600 mg, Oral, BID, Foust, Katy L, NP, 600 mg at 10/31/24 9171   melatonin tablet 5 mg, 5  mg, Oral, QHS PRN, Foust, Katy L, NP, 5 mg at 10/29/24 2129   multivitamin with minerals tablet 1 tablet, 1 tablet, Oral, Daily, Wieting, Richard, MD, 1 tablet at 10/31/24 9171   nicotine  (NICODERM CQ  - dosed in mg/24 hours) patch 14 mg, 14 mg, Transdermal, Daily, Foust, Katy L, NP, 14 mg at 10/31/24 9171   nicotine  polacrilex (NICORETTE) gum 2 mg, 2 mg, Oral, PRN, Foust, Katy L, NP   Oral care mouth rinse, 15 mL, Mouth Rinse, PRN, Wieting, Richard, MD   pantoprazole (PROTONIX) EC tablet 40 mg, 40 mg, Oral, Daily, Foust, Katy L, NP, 40 mg at 10/31/24 0828   [COMPLETED] predniSONE  (DELTASONE ) tablet 50 mg, 50 mg, Oral, Q breakfast, 50  mg at 10/24/24 0841 **FOLLOWED BY** [COMPLETED] predniSONE  (DELTASONE ) tablet 45 mg, 45 mg, Oral, Q breakfast, 45 mg at 10/25/24 0930 **FOLLOWED BY** [COMPLETED] predniSONE  (DELTASONE ) tablet 40 mg, 40 mg, Oral, Q breakfast, 40 mg at 10/26/24 0945 **FOLLOWED BY** [COMPLETED] predniSONE  (DELTASONE ) tablet 35 mg, 35 mg, Oral, Q breakfast, 35 mg at 10/27/24 0959 **FOLLOWED BY** [COMPLETED] predniSONE  (DELTASONE ) tablet 30 mg, 30 mg, Oral, Q breakfast, 30 mg at 10/28/24 0849 **FOLLOWED BY** [COMPLETED] predniSONE  (DELTASONE ) tablet 25 mg, 25 mg, Oral, Q breakfast, 25 mg at 10/29/24 0800 **FOLLOWED BY** [COMPLETED] predniSONE  (DELTASONE ) tablet 20 mg, 20 mg, Oral, Q breakfast, 20 mg at 10/30/24 0818 **FOLLOWED BY** [COMPLETED] predniSONE  (DELTASONE ) tablet 15 mg, 15 mg, Oral, Q breakfast, 15 mg at 10/31/24 0828 **FOLLOWED BY** [START ON 11/01/2024] predniSONE  (DELTASONE ) tablet 10 mg, 10 mg, Oral, Q breakfast **FOLLOWED BY** [START ON 11/02/2024] predniSONE  (DELTASONE ) tablet 5 mg, 5 mg, Oral, Q breakfast, Amour Trigg, MD   QUEtiapine  (SEROQUEL ) tablet 25 mg, 25 mg, Oral, QHS, Smith, Annie B, NP, 25 mg at 10/30/24 2107    ALLERGIES   Patient has no known allergies.     REVIEW OF SYSTEMS    Review of Systems:  Gen:  Denies  fever, sweats, chills weigh loss  HEENT:  Denies blurred vision, double vision, ear pain, eye pain, hearing loss, nose bleeds, sore throat Cardiac:  No dizziness, chest pain or heaviness, chest tightness,edema Resp:   reports dyspnea chronically  Gi: Denies swallowing difficulty, stomach pain, nausea or vomiting, diarrhea, constipation, bowel incontinence Gu:  Denies bladder incontinence, burning urine Ext:   Denies Joint pain, stiffness or swelling Skin: Denies  skin rash, easy bruising or bleeding or hives Endoc:  Denies polyuria, polydipsia , polyphagia or weight change Psych:   Denies depression, insomnia or hallucinations   Other:  All other systems negative   VS: BP 118/81 (BP Location: Left Arm)   Pulse 75   Temp 97.8 F (36.6 C) (Oral)   Resp 16   Ht 5' 4 (1.626 m)   Wt 51.9 kg   SpO2 91%   BMI 19.64 kg/m      PHYSICAL EXAM    GENERAL:NAD, no fevers, chills, no weakness no fatigue HEAD: Normocephalic, atraumatic.  EYES: Pupils equal, round, reactive to light. Extraocular muscles intact. No scleral icterus.  MOUTH: Moist mucosal membrane. Dentition intact. No abscess noted.  EAR, NOSE, THROAT: Clear without exudates. No external lesions.  NECK: Supple. No thyromegaly. No nodules. No JVD.  PULMONARY: decreased breath sounds clear to auscultation CARDIOVASCULAR: S1 and S2. Regular rate and rhythm. No murmurs, rubs, or gallops. No edema. Pedal pulses 2+ bilaterally.  GASTROINTESTINAL: Soft, nontender, nondistended. No masses. Positive bowel sounds. No hepatosplenomegaly.  MUSCULOSKELETAL: No swelling, clubbing, or edema. Range of motion full in all extremities.  NEUROLOGIC: Cranial nerves II through XII are intact. No gross focal neurological deficits. Sensation intact. Reflexes intact.  SKIN: No ulceration, lesions, rashes, or cyanosis. Skin warm and dry. Turgor intact.  PSYCHIATRIC: Mood, affect within normal limits. The patient is awake, alert and oriented x 3. Insight, judgment intact.       IMAGING      rative & Impression  EXAM: CTA of the Chest with contrast for PE 10/20/2024 02:19:01 PM   TECHNIQUE: CTA of the chest was performed without and with the administration of 75 mL of iohexol  (OMNIPAQUE ) 350 MG/ML injection. Multiplanar reformatted images are provided for review. MIP images are provided for review. Automated exposure control, iterative  reconstruction, and/or weight based adjustment of the mA/kV was utilized to reduce the radiation dose to as low as reasonably achievable.   COMPARISON: Same day x-ray and CTA chest 01/04/2023.   CLINICAL HISTORY: Pulmonary embolism (PE) suspected, high prob.   FINDINGS:   PULMONARY ARTERIES: Pulmonary arteries are adequately opacified for evaluation. Negative for pulmonary embolism. Main pulmonary artery is normal in caliber.   MEDIASTINUM: Dilated right ventricle with reflux of contrast into the IVC and hepatic veins compatible with elevated right heart pressures. There is no acute abnormality of the thoracic aorta.   LYMPH NODES: No mediastinal, hilar or axillary lymphadenopathy.   LUNGS AND PLEURA: Diffuse bronchial wall thickening. Mucous plugging in the lower lobes. Emphysema. No focal consolidation or pulmonary edema. No pleural effusion or pneumothorax.   UPPER ABDOMEN: Limited images of the upper abdomen are unremarkable.   SOFT TISSUES AND BONES: No acute bone or soft tissue abnormality.   IMPRESSION: 1. No pulmonary embolism. 2. Dilated right ventricle with reflux of contrast into the IVC and hepatic veins, compatible with elevated right heart pressures. 3. Chronic bronchitis. 4. Emphysema. Given patient age, consider evaluation for low-dose CT lung cancer screening.   Electronically signed by: Norman Gatlin MD 10/20/2024 02:49 PM EST RP Workstation: [id number]      ASSESSMENT/PLAN     Acute exacerbation of COPD- RESOLVED   - continue nebulizer therapy Duoneb 16h prn   - s/p solumedrol 125x1  followed by 40mg  IV daily -wean to pred  -adding zithromax  PO - PT/OT -Incentive spirometry and flutter valve    Mucus plugging of tracheobronchial tree-RESOLVED     - considering outpatient bronchoscopy    Tobacco dependence   - 14mg  transdermal patch     Enlarged Right ventricle Suggestive of pulmonary hypertension - would recommend RHC via cardiology service when possible non urgently    Psychiatric history   Patient is currently being evaluated by psychiatrist     Chronic hypoxemia    -  Mild hypoxemia noted on exertional walk.  TOC working on disability for insurance coverage.  He does have advanced emphysema with hypoxemia and homelesness.            Thank you for allowing me to participate in the care of this patient.   Patient/Family are satisfied with care plan and all questions have been answered.    Provider disclosure: Patient with at least one acute or chronic illness or injury that poses a threat to life or bodily function and is being managed actively during this encounter.  All of the below services have been performed independently by signing provider:  review of prior documentation from internal and or external health records.  Review of previous and current lab results.  Interview and comprehensive assessment during patient visit today. Review of current and previous chest radiographs/CT scans. Discussion of management and test interpretation with health care team and patient/family.   This document was prepared using Dragon voice recognition software and may include unintentional dictation errors.     Aiman Sonn, M.D.  Division of Pulmonary & Critical Care Medicine

## 2024-10-31 NOTE — Progress Notes (Signed)
 Physical Therapy Discharge Patient Details Name: Corey House. MRN: [id number] DOB: 1966-01-03 Today's Date: 10/31/2024 Time:  -     Patient discharged from acute PT services secondary to no acute PT needs at this time .  Pt has been getting up and ambulating without physical assistance or use of a AD. O2 requirements remains pt's only barrier to Dcing. Mobility techs/RN staff to continue to wean/monitor o2 requirements/needs. No post acute PT needs. Will sign off.   Progress and discharge plan discussed with patient.    Rankin Essex PTA 10/31/24, 9:19 AM

## 2024-10-31 NOTE — Plan of Care (Signed)

## 2024-10-31 NOTE — Progress Notes (Signed)
 Mobility Specialist - Progress Note  Pre-mobility: HR-89,  SpO2-88%  During mobility: HR-90,  SpO2-85% recovered to 91% < 1 min  Post-mobility: HR-86, , SPO2-92%   10/31/24 0814  Therapy Vitals  Temp 97.8 F (36.6 C)  Temp Source Oral  Pulse Rate 75  Resp 16  BP 118/81  Patient Position (if appropriate) Lying  Oxygen Therapy  SpO2 91 %  Mobility  Activity Ambulated with assistance;Stood at bedside  Level of Assistance Independent after set-up  Assistive Device None  Distance Ambulated (ft) 200 ft  Range of Motion/Exercises Active  Activity Response Tolerated well  Mobility visit 1 Mobility  Mobility Specialist Start Time (ACUTE ONLY) 0827  Mobility Specialist Stop Time (ACUTE ONLY) 0840  Mobility Specialist Time Calculation (min) (ACUTE ONLY) 13 min   Pt was supine in bed on RA with nurse in room upon entry. Pt agreed to mobility. Pt is able today to get to the EOB independently with no bed features. Pt is able today to STS independently with no AD. Pts O2 vitals were taken throughout activity as a precaution. Pt did need recovery break to check vitals. After activity pt returned to the bed with needs in reach.  Clem Rodes Mobility Specialist 10/31/24, 8:53 AM

## 2024-10-31 NOTE — Progress Notes (Signed)
 Mobility Specialist - Progress Note    10/31/24 1600  Mobility  Activity Ambulated with assistance  Level of Assistance Independent after set-up  Assistive Device None  Distance Ambulated (ft) 200 ft  Range of Motion/Exercises Active  Activity Response Tolerated well  Mobility visit 1 Mobility  Mobility Specialist Start Time (ACUTE ONLY) 1553  Mobility Specialist Stop Time (ACUTE ONLY) 1602  Mobility Specialist Time Calculation (min) (ACUTE ONLY) 9 min   Pt was supine in bed on RA upon entry. Pt agreed to mobility. Pt O2 vitals are taken throughout activity as a precaution. Pt ambulated well pt O2 vitals during recovery break dropped as low as 85%. Pt recovered in< 45 sec to above 91%. After activity pt returned to the room in bed with needs in reach.  Clem Rodes Mobility Specialist 10/31/24, 4:18 PM

## 2024-11-01 DIAGNOSIS — J9601 Acute respiratory failure with hypoxia: Secondary | ICD-10-CM | POA: Diagnosis not present

## 2024-11-01 DIAGNOSIS — J9602 Acute respiratory failure with hypercapnia: Secondary | ICD-10-CM | POA: Diagnosis not present

## 2024-11-01 LAB — BASIC METABOLIC PANEL WITH GFR
Anion gap: 9 (ref 5–15)
BUN: 20 mg/dL (ref 6–20)
CO2: 36 mmol/L — ABNORMAL HIGH (ref 22–32)
Calcium: 9.1 mg/dL (ref 8.9–10.3)
Chloride: 99 mmol/L (ref 98–111)
Creatinine, Ser: 0.71 mg/dL (ref 0.61–1.24)
GFR, Estimated: >60 mL/min (ref >60)
Glucose, Bld: 88 mg/dL (ref 70–99)
Potassium: 4.6 mmol/L (ref 3.5–5.1)
Sodium: 144 mmol/L (ref 135–145)

## 2024-11-01 LAB — GLUCOSE, CAPILLARY: Glucose-Capillary: 134 mg/dL — ABNORMAL HIGH (ref 70–99)

## 2024-11-01 NOTE — Plan of Care (Signed)
  Problem: Pain Managment: Goal: General experience of comfort will improve and/or be controlled Outcome: Progressing   Problem: Safety: Goal: Ability to remain free from injury will improve Outcome: Progressing   Problem: Skin Integrity: Goal: Risk for impaired skin integrity will decrease Outcome: Progressing

## 2024-11-01 NOTE — Progress Notes (Signed)
 PULMONOLOGY         Date: 11/01/2024,   MRN# [id number] Corey House. Apr 30, 1966     AdmissionWeight: 58.8 kg                 CurrentWeight: 51.8 kg  Referring provider: Dr Josette   CHIEF COMPLAINT:   Acute exacerbation of CHF and COPD   HISTORY OF PRESENT ILLNESS   This is a 58 yo M with hx of chronic cough, COPD, CHF, Urolithiasis, tobacco dependence who came in due to worsening SOB.  [name] hypoxemic requiring HHFNC at 15L/min to reach normoxia.  Since admission he has partially improved with weaning down of O2 req.  He had CT chest with PE protocol and was found to have chronic bronchitic COPD and centrilobular emphysema with no venous thromboemoblism.  He also had noted RV dilation suggestive of RV failure.  Cardiac biomarkers were mildy elevated consisted with acute on chronic CHF. He had septic workup performed and had steroids with antibiotics prescribed. He also had DVT studies and this was negative for bilateral LE dvt.  Patient requested pulmonary evaluation and for this reason consultation was placed.   10/22/24- patient is improved, CXR repeating today. Preparing for dc home in AM. Plan to wean steroids and abx to PO regimen on outpatient basis.   10/23/24- patient is able to ambulate around hallway.  He did have very transient mild hypoxemia. He is improved quite a bit. We discussed smoking cessation.  He is concerned about his homelessness. His blood work is improved and blood gas is with chronic hypercapnic respiratory failure.   10/24/24- patient is resting in bed comfortably, reports expectoration of mucus this am and feels slightly better.  He was able to walk around hallway today. CXR is clear.  Plan to wean off oxygen and dc home when possible.  He's on zithromax  250 PO and prednisone  taper. He should use IS and flutter valve.   10/25/24- patient continues to desaturate while ambulating.  He drops his oxygen down to low 80s with slow  walking. Difficult situation with homelessness and needing oxygen therapy and possible BIPAP  10/26/24- patient reports feeling week and dyspneic in bed. He is working on obtaining placement.  He is with advanced COPD.   10/27/24- patient laying in bed in NAD.  His bloodwork is stable. He reports dyspnea at rest. He will need oxygen upon dc it seems due to recurrent hypoxemia.  10/28/24 - patient is laying in bed in do distress. He is on 3L/min [place].  He reports dyspnea at rest but does not appear to be using accessory muscles of respirations and is not tachypneic on examination. His lung auscultation is clear bilaterally.   10/29/24- patient reports anxiety while resting in bed this morning.  He asks for xanax during my interview.  Ive asked RN to perform exertional ambulation for oxygen requirement on room air this morning.  He appears to be resting in bed in no distress with no adventitious lung sounds on auscultation.  Hopefully can dc out of hospital on ambient air today  10/30/24- patient feels better.  We tried to ambulate on room air but he did have desaturation again. Overall he seems less dyspneic and reports able to ambulate further with less effort.  He has trouble with discharge due to homelessness and O2 needs  10/31/24- Patient states this morning, he wishes to be dcd.  He reports he would be agreeable to  leave without oxygen if he is able.  He has TOC team working with him for disability  coverage.  His hypoxemia is mild on exertion around 88%.  He reports if things get worse he can come back to ER but is agreeable to stay here as well.   11/01/24- patient on room air.  He feels better, has not had PT/OT today to check on oxygen desaturation.  He is working on disability to help with coverage and housing. He is on prednisone  5mg  and zithromax  po 250.  He's stable from pulmonary perspective  PAST MEDICAL HISTORY   Past Medical History:  Diagnosis Date   Chronic cough    Urolithiasis     Urolithiasis      SURGICAL HISTORY   Past Surgical History:  Procedure Laterality Date   CATARACT EXTRACTION W/PHACO Left 08/09/2021   Procedure: CATARACT EXTRACTION PHACO AND INTRAOCULAR LENS PLACEMENT (IOC) LEFT .051 00:10.0;  Surgeon: Mittie Gaskin, MD;  Location: [facility];  Service: Ophthalmology;  Laterality: Left;   CATARACT EXTRACTION W/PHACO Right 10/04/2021   Procedure: CATARACT EXTRACTION PHACO AND INTRAOCULAR LENS PLACEMENT (IOC) RIGHT 0.67 00:13.8;  Surgeon: Mittie Gaskin, MD;  Location: [facility];  Service: Ophthalmology;  Laterality: Right;  general anesthesia needs to be last case   HERNIA REPAIR     x2   MANDIBLE FRACTURE SURGERY       FAMILY HISTORY   History reviewed. No pertinent family history.   SOCIAL HISTORY   Social History   Tobacco Use   Smoking status: Every Day    Current packs/day: 1.00    Average packs/day: 1 pack/day for 35.0 years (35.0 ttl pk-yrs)    Types: Cigarettes   Smokeless tobacco: Never   Tobacco comments:    Started smoking age 51  Vaping Use   Vaping status: Never Used  Substance Use Topics   Alcohol use: No   Drug use: Not Currently     MEDICATIONS    Home Medication:    Current Medication:  Current Facility-Administered Medications:    acetaminophen  (TYLENOL ) tablet 650 mg, 650 mg, Oral, Q6H PRN, Josette Ade, MD, 650 mg at 10/31/24 1116   albuterol  (PROVENTIL ) (2.5 MG/3ML) 0.083% nebulizer solution 2.5 mg, 2.5 mg, Nebulization, Q2H PRN, Foust, Katy L, NP, 2.5 mg at 10/23/24 9176   ALPRAZolam (XANAX) tablet 0.25 mg, 0.25 mg, Oral, TID PRN, Josette Ade, MD, 0.25 mg at 11/01/24 0857   artificial tears ophthalmic solution 1 drop, 1 drop, Both Eyes, PRN, Foust, Katy L, NP   azithromycin  (ZITHROMAX ) tablet 250 mg, 250 mg, Oral, Daily, Aleena Kirkeby, MD, 250 mg at 11/01/24 0857   enoxaparin  (LOVENOX ) injection 40 mg, 40 mg, Subcutaneous, Q24H, Foust, Katy L, NP, 40 mg at  10/31/24 1628   escitalopram  (LEXAPRO ) tablet 10 mg, 10 mg, Oral, Daily, Smith, Annie B, NP, 10 mg at 11/01/24 0857   guaiFENesin  (MUCINEX ) 12 hr tablet 600 mg, 600 mg, Oral, BID, Foust, Katy L, NP, 600 mg at 11/01/24 0857   melatonin tablet 5 mg, 5 mg, Oral, QHS PRN, Foust, Katy L, NP, 5 mg at 10/29/24 2129   multivitamin with minerals tablet 1 tablet, 1 tablet, Oral, Daily, Wieting, Richard, MD, 1 tablet at 11/01/24 0857   nicotine  (NICODERM CQ  - dosed in mg/24 hours) patch 14 mg, 14 mg, Transdermal, Daily, Foust, Katy L, NP, 14 mg at 11/01/24 0900   nicotine  polacrilex (NICORETTE) gum 2 mg, 2 mg, Oral, PRN, Foust, Rockie CROME, NP  Oral care mouth rinse, 15 mL, Mouth Rinse, PRN, Josette, Richard, MD   pantoprazole (PROTONIX) EC tablet 40 mg, 40 mg, Oral, Daily, Foust, Katy L, NP, 40 mg at 11/01/24 0857   [COMPLETED] predniSONE  (DELTASONE ) tablet 50 mg, 50 mg, Oral, Q breakfast, 50 mg at 10/24/24 0841 **FOLLOWED BY** [COMPLETED] predniSONE  (DELTASONE ) tablet 45 mg, 45 mg, Oral, Q breakfast, 45 mg at 10/25/24 0930 **FOLLOWED BY** [COMPLETED] predniSONE  (DELTASONE ) tablet 40 mg, 40 mg, Oral, Q breakfast, 40 mg at 10/26/24 0945 **FOLLOWED BY** [COMPLETED] predniSONE  (DELTASONE ) tablet 35 mg, 35 mg, Oral, Q breakfast, 35 mg at 10/27/24 0959 **FOLLOWED BY** [COMPLETED] predniSONE  (DELTASONE ) tablet 30 mg, 30 mg, Oral, Q breakfast, 30 mg at 10/28/24 0849 **FOLLOWED BY** [COMPLETED] predniSONE  (DELTASONE ) tablet 25 mg, 25 mg, Oral, Q breakfast, 25 mg at 10/29/24 0800 **FOLLOWED BY** [COMPLETED] predniSONE  (DELTASONE ) tablet 20 mg, 20 mg, Oral, Q breakfast, 20 mg at 10/30/24 0818 **FOLLOWED BY** [COMPLETED] predniSONE  (DELTASONE ) tablet 15 mg, 15 mg, Oral, Q breakfast, 15 mg at 10/31/24 0828 **FOLLOWED BY** [COMPLETED] predniSONE  (DELTASONE ) tablet 10 mg, 10 mg, Oral, Q breakfast, 10 mg at 11/01/24 0857 **FOLLOWED BY** [START ON 11/02/2024] predniSONE  (DELTASONE ) tablet 5 mg, 5 mg, Oral, Q breakfast, Surie Suchocki,  MD   QUEtiapine  (SEROQUEL ) tablet 25 mg, 25 mg, Oral, QHS, Smith, Annie B, NP, 25 mg at 10/31/24 2204    ALLERGIES   Patient has no known allergies.     REVIEW OF SYSTEMS    Review of Systems:  Gen:  Denies  fever, sweats, chills weigh loss  HEENT: Denies blurred vision, double vision, ear pain, eye pain, hearing loss, nose bleeds, sore throat Cardiac:  No dizziness, chest pain or heaviness, chest tightness,edema Resp:   reports dyspnea chronically  Gi: Denies swallowing difficulty, stomach pain, nausea or vomiting, diarrhea, constipation, bowel incontinence Gu:  Denies bladder incontinence, burning urine Ext:   Denies Joint pain, stiffness or swelling Skin: Denies  skin rash, easy bruising or bleeding or hives Endoc:  Denies polyuria, polydipsia , polyphagia or weight change Psych:   Denies depression, insomnia or hallucinations   Other:  All other systems negative   VS: BP 109/72 (BP Location: Left Arm)   Pulse 79   Temp 98.3 F (36.8 C) (Oral)   Resp 16   Ht 5' 4 (1.626 m)   Wt 51.8 kg   SpO2 92%   BMI 19.60 kg/m      PHYSICAL EXAM    GENERAL:NAD, no fevers, chills, no weakness no fatigue HEAD: Normocephalic, atraumatic.  EYES: Pupils equal, round, reactive to light. Extraocular muscles intact. No scleral icterus.  MOUTH: Moist mucosal membrane. Dentition intact. No abscess noted.  EAR, NOSE, THROAT: Clear without exudates. No external lesions.  NECK: Supple. No thyromegaly. No nodules. No JVD.  PULMONARY: decreased breath sounds clear to auscultation CARDIOVASCULAR: S1 and S2. Regular rate and rhythm. No murmurs, rubs, or gallops. No edema. Pedal pulses 2+ bilaterally.  GASTROINTESTINAL: Soft, nontender, nondistended. No masses. Positive bowel sounds. No hepatosplenomegaly.  MUSCULOSKELETAL: No swelling, clubbing, or edema. Range of motion full in all extremities.  NEUROLOGIC: Cranial nerves II through XII are intact. No gross focal neurological deficits.  Sensation intact. Reflexes intact.  SKIN: No ulceration, lesions, rashes, or cyanosis. Skin warm and dry. Turgor intact.  PSYCHIATRIC: Mood, affect within normal limits. The patient is awake, alert and oriented x 3. Insight, judgment intact.       IMAGING     rative & Impression  EXAM: CTA  of the Chest with contrast for PE 10/20/2024 02:19:01 PM   TECHNIQUE: CTA of the chest was performed without and with the administration of 75 mL of iohexol  (OMNIPAQUE ) 350 MG/ML injection. Multiplanar reformatted images are provided for review. MIP images are provided for review. Automated exposure control, iterative reconstruction, and/or weight based adjustment of the mA/kV was utilized to reduce the radiation dose to as low as reasonably achievable.   COMPARISON: Same day x-ray and CTA chest 01/04/2023.   CLINICAL HISTORY: Pulmonary embolism (PE) suspected, high prob.   FINDINGS:   PULMONARY ARTERIES: Pulmonary arteries are adequately opacified for evaluation. Negative for pulmonary embolism. Main pulmonary artery is normal in caliber.   MEDIASTINUM: Dilated right ventricle with reflux of contrast into the IVC and hepatic veins compatible with elevated right heart pressures. There is no acute abnormality of the thoracic aorta.   LYMPH NODES: No mediastinal, hilar or axillary lymphadenopathy.   LUNGS AND PLEURA: Diffuse bronchial wall thickening. Mucous plugging in the lower lobes. Emphysema. No focal consolidation or pulmonary edema. No pleural effusion or pneumothorax.   UPPER ABDOMEN: Limited images of the upper abdomen are unremarkable.   SOFT TISSUES AND BONES: No acute bone or soft tissue abnormality.   IMPRESSION: 1. No pulmonary embolism. 2. Dilated right ventricle with reflux of contrast into the IVC and hepatic veins, compatible with elevated right heart pressures. 3. Chronic bronchitis. 4. Emphysema. Given patient age, consider evaluation for low-dose CT  lung cancer screening.   Electronically signed by: Norman Gatlin MD 10/20/2024 02:49 PM EST RP Workstation: [id number]      ASSESSMENT/PLAN     Acute exacerbation of COPD- RESOLVED   - continue nebulizer therapy Duoneb 16h prn   - s/p solumedrol 125x1 followed by 40mg  IV daily -wean to pred  -adding zithromax  PO - PT/OT -Incentive spirometry and flutter valve    Mucus plugging of tracheobronchial tree-RESOLVED     - considering outpatient bronchoscopy    Tobacco dependence   - 14mg  transdermal patch     Enlarged Right ventricle Suggestive of pulmonary hypertension - would recommend RHC via cardiology service when possible non urgently    Psychiatric history   Patient is currently being evaluated by psychiatrist     Chronic hypoxemia    -  Mild hypoxemia noted on exertional walk.  TOC working on disability for insurance coverage.  He does have advanced emphysema with hypoxemia and homelesness.            Thank you for allowing me to participate in the care of this patient.   Patient/Family are satisfied with care plan and all questions have been answered.    Provider disclosure: Patient with at least one acute or chronic illness or injury that poses a threat to life or bodily function and is being managed actively during this encounter.  All of the below services have been performed independently by signing provider:  review of prior documentation from internal and or external health records.  Review of previous and current lab results.  Interview and comprehensive assessment during patient visit today. Review of current and previous chest radiographs/CT scans. Discussion of management and test interpretation with health care team and patient/family.   This document was prepared using Dragon voice recognition software and may include unintentional dictation errors.     Aaryana Betke, M.D.  Division of Pulmonary & Critical Care Medicine

## 2024-11-01 NOTE — Progress Notes (Signed)
 Progress Note   Patient: Corey House. FMW:[id number] DOB: Feb 24, 1966 DOA: 10/20/2024     12 DOS: the patient was seen and examined on 11/01/2024   Brief hospital course: 59 y.o. year old male with past medical history of  tobacco use disorder reporting a 41 year pack per year history, smoking ~1 pack per day since he was 58 years old. He denies any additional medical history though he has not seen a medical provider in ~20 years. He presents to [facility] ED with shortness of breath that began ~ 9 months ago and worsened ~3 months ago. He reports today his dyspnea is not significantly different than it was 3 months ago. He endorses a chronic cough that has been present since childhood. Her ports the cough is currently, minimally productive with white phlegm. Additionally, he endorses bilateral lower extremity swelling that began in the last 2 days, per his report (R) > (L). On exam the swelling is equal bilaterally. He endorses fatigue and dyspnea that is worse with position changes such as bending over and when standing from lying. He denies chest pain, palpitations, or fever.   ED Course: On arrival to [facility] ED patient was noted to be afebrile temp 36.8C, BP 142/111, HR 88, RR 22, SPO2 100% on 15L non-rebreather. Patient now on 2L via nasal cannula with SPO2 96%. CXR obtained and shows non specific findings of increased interstitial markings. CTA PE obtained and shows no PE, dilated RV and reflux of contract into the IVC and hepatic vein consistent with (R) heart failure, chronic bronchitis, and emphysema. US  DVT (B)LE negative for DVT. Labs notable Negative COVID, flu, RSV,for BNP 284.2, BUN 24, troponin 41--> 38 with no report of chest pain.  Lactic acid 0.9.  Blood cultures drawn and in process.  He was given Lasix, DuoNebs, and Solu-Medrol .  TRH contacted for admission.  11/5.  Pulse ox with ambulation down to 85%.  Patient admitted with COPD and CHF exacerbation.  Patient  also states that he is short of breath and he is waiting to die.  Appreciate psychiatric consultation.  Patient is homeless. 11/6.  Patient states that he has been sleepy.  Venous blood gas shows a pCO2 of 79 with a pH of 7.41.  Will start BiPAP at night.  Try to see if we can taper off oxygen. 11/7.  Patient hypoxic with walking around with pulmonary to 87%. 11/8.  Patient's pulse ox dropped down to 84% with ambulation. 11/9.  Patient had a pulse ox of 78% on room air at rest but he stated he walked back from the bathroom. 11/10.  Pulse ox in the low 80s with ambulation.  Likely will need chronic oxygen.  Assessment and Plan: * Acute respiratory failure with hypoxia and hypercapnia (HCC) 11/14 patient working with occupational therapy and O2 sats on room air 85 to 88%. Patient's oxygen was left off for a couple of hours and his ABG showed pO2 of 59 mmHg and O2 sat of 87%.    11/16 states no dyspnea at rest, mostly with ambulation.   TOC continues to work on disposition for patient given his oxygen requirement  COPD with acute exacerbation (HCC) Patient on Zithromax , on long prednisone  taper and nebulizer treatments.  Pulmonology is following inpatient  Acute on chronic diastolic CHF (congestive heart failure) (HCC) Resolved EF 55 to 60%.  Initially on IV Lasix. CT scan commenting on enlarged RV.  No current signs of heart failure. 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  -Does not appear volume overloaded -Continue to treat underlying cause  -May require right heart cath prior to discharge  Tobacco use disorder Discussed smoking cessation, continue nicotine  patch  Headache Resolved Increased anion gap metabolic acidosis Resolved Hypernatremia Resolved  Depression Patient on Lexapro  and Seroquel  at night  Protein-calorie  malnutrition, severe Continue supplements        Subjective:No problems overnight.   Physical Exam: Vitals:   10/31/24 2050 11/01/24 0454 11/01/24 0500 11/01/24 0925  BP: 113/78 110/69  109/72  Pulse: 84 73  79  Resp: 16 16  16   Temp: 98.9 F (37.2 C) 98.1 F (36.7 C)  98.3 F (36.8 C)  TempSrc:    Oral  SpO2: 90% 92%  92%  Weight:   51.8 kg   Height:       Physical Exam  Constitutional: In no distress.  Cardiovascular: Normal rate, regular rhythm. No lower extremity edema  Pulmonary: Non labored breathing on room air, no wheezing or rales.   Abdominal: Soft. Non distended and non tender Musculoskeletal: Normal range of motion.     Neurological: Alert and oriented to person, place, and time. Non focal  Skin: Skin is warm and dry.        Data Reviewed:    Latest Ref Rng & Units 10/28/2024    4:38 AM 10/26/2024    4:41 AM 10/21/2024    5:23 AM  CBC  WBC 4.0 - 10.5 K/uL 6.6  5.7  3.3   Hemoglobin 13.0 - 17.0 g/dL 85.0  84.7  85.1   Hematocrit 39.0 - 52.0 % 46.8  48.7  46.7   Platelets 150 - 400 K/uL 286  287  251       Latest Ref Rng & Units 11/01/2024    5:17 AM 10/29/2024    6:11 AM 10/28/2024    4:38 AM  BMP  Glucose 70 - 99 mg/dL 88  86  97   BUN 6 - 20 mg/dL 20  20  23    Creatinine 0.61 - 1.24 mg/dL 9.28  9.38  9.33   Sodium 135 - 145 mmol/L 144  141  143   Potassium 3.5 - 5.1 mmol/L 4.6  4.2  4.5   Chloride 98 - 111 mmol/L 99  97  96   CO2 22 - 32 mmol/L 36  39  41   Calcium 8.9 - 10.3 mg/dL 9.1  9.0  9.2      Disposition: Status is: Inpatient Remains inpatient appropriate because: Since patient is unhomed and currently unable to get off oxygen we are in a tough situation.  We do not have a safe disposition  Planned Discharge Destination: Will need some sort of placement    Time spent: 25 minutes  Author: Alban Pepper, MD 11/01/2024 9:52 AM  For on call review [email or web address].

## 2024-11-01 NOTE — Plan of Care (Signed)
  Problem: Education: Goal: Knowledge of General Education information will improve Description: Including pain rating scale, medication(s)/side effects and non-pharmacologic comfort measures Outcome: Progressing   Problem: Clinical Measurements: Goal: Ability to maintain clinical measurements within normal limits will improve Outcome: Progressing Goal: Respiratory complications will improve Outcome: Progressing   Problem: Activity: Goal: Risk for activity intolerance will decrease Outcome: Progressing   Problem: Nutrition: Goal: Adequate nutrition will be maintained Outcome: Progressing   Problem: Coping: Goal: Level of anxiety will decrease Outcome: Progressing   Problem: Pain Managment: Goal: General experience of comfort will improve and/or be controlled Outcome: Progressing   Problem: Education: Goal: Knowledge of disease or condition will improve Outcome: Progressing   Problem: Respiratory: Goal: Levels of oxygenation will improve Outcome: Progressing

## 2024-11-02 DIAGNOSIS — J9601 Acute respiratory failure with hypoxia: Secondary | ICD-10-CM | POA: Diagnosis not present

## 2024-11-02 DIAGNOSIS — J9602 Acute respiratory failure with hypercapnia: Secondary | ICD-10-CM | POA: Diagnosis not present

## 2024-11-02 NOTE — Progress Notes (Signed)
 Nutrition Follow-up  DOCUMENTATION CODES:   Severe malnutrition in context of social or environmental circumstances  INTERVENTION:   -Continue 2 gram sodium diet -Continue MVI with minerals daily -Continue Magic cup TID with meals, each supplement provides 290 kcal and 9 grams of protein   NUTRITION DIAGNOSIS:   Severe Malnutrition related to social / environmental circumstances as evidenced by moderate fat depletion, severe fat depletion, moderate muscle depletion, severe muscle depletion, edema.  Ongoing  GOAL:   Patient will meet greater than or equal to 90% of their needs  Progressing   MONITOR:   PO intake, Supplement acceptance  REASON FOR ASSESSMENT:   Malnutrition Screening Tool    ASSESSMENT:   58 y.o. year old male with past medical history of  tobacco use disorder reporting a 41 year pack per year history, smoking ~1 pack per day since he was 58 years old. He denies any additional medical history though he has not seen a medical provider in ~20 years. He presents with shortness of breath that began ~ 9 months ago and worsened ~3 months ago PTA. He reports today his dyspnea is not significantly different than it was 3 months ago PTA. He endorses a chronic cough that has been present since childhood.  Reviewed I/O's: +240 ml x 24 hours and -3.9 L since admission   Patient meeting with case manager at time of visit.   Patient remains with good appetite. Noted meal completions 50-100%.   Per TOC notes, patient with difficult social situation (homeless, in need of oxygen). [employer] representative meeting with patient today and also representative from [facility] to meet with him. TOC has sent out SNF referrals to all [facility] related facilities; patient does not have a bed offer yet.   Weight has been stable over the past week.   Medications reviewed and include lovenox , lexapro , and protonix.   Labs reviewed: CBGS: 134.    Diet Order:   Diet Order              Diet 2 gram sodium Fluid consistency: Thin  Diet effective now                   EDUCATION NEEDS:   Education needs have been addressed  Skin:  Skin Assessment: Reviewed RN Assessment  Last BM:  11/01/24  Height:   Ht Readings from Last 1 Encounters:  10/20/24 5' 4 (1.626 m)    Weight:   Wt Readings from Last 1 Encounters:  11/02/24 53 kg    Ideal Body Weight:  59.1 kg  BMI:  Body mass index is 20.06 kg/m.  Estimated Nutritional Needs:   Kcal:  1950-2150  Protein:  105-120 grams  Fluid:  1.9-2.1 L    Margery ORN, RD, LDN, CDCES Registered Dietitian III Certified Diabetes Care and Education Specialist If unable to reach this RD, please use RD Inpatient group chat on secure chat between hours of 8am-4 pm daily

## 2024-11-02 NOTE — Progress Notes (Signed)
 Patient's case manager from [employer] at bedside performing intake. Plan for meeting with housing rep Zelvia this afternoon. Representative from [facility] also at bedside. Assigned CM, per listing, notified via secure chat.

## 2024-11-02 NOTE — TOC Progression Note (Signed)
 Transition of Care (TOC) - Progression Note    Patient Details  Name: Corey House. MRN: [id number] Date of Birth: [birth year]  Transition [facility] Care [facility] CM/SW Contact  Corey JONETTA Hamilton, RN Phone Number: 11/02/2024, 1:39 PM  Clinical Narrative:     TOC received call from [employer] rep Corey House. Corey House advised that Corey House Case Production Designer, Theatre/television/film had met with patient this morning and they plan to try and get patient accepted into a group home. Corey House also inquired about patient being able to get a portable finger O2 monitor, this CM advised will reach out to DME representative to ask about availability, Corey House verbalized understanding and agreement with this plan.   UPDATE:  Spoke with Corey House from [employer], O2 finger monitors are retail items and are no longer carried.    Expected Discharge Plan:  (patient is currently homeless, unsure of discharge location at this time) Barriers to Discharge: Continued Medical Work up, Other (must enter comment) (homeless)               Expected Discharge Plan and Services   Discharge Planning Services: CM Consult   Living arrangements for the past 2 months: Homeless                                       Social Drivers of Health (SDOH) Interventions SDOH Screenings   Food Insecurity: Food Insecurity Present (10/20/2024)  Housing: High Risk (10/21/2024)  Transportation Needs: Unmet Transportation Needs (10/20/2024)  Utilities: At Risk (10/20/2024)  Alcohol Screen: Low Risk  (04/23/2024)  Social Connections: Unknown (04/23/2024)  Tobacco Use: High Risk (10/21/2024)    Readmission Risk Interventions     No data to display

## 2024-11-02 NOTE — Plan of Care (Signed)
  Problem: Education: Goal: Knowledge of General Education information will improve Description: Including pain rating scale, medication(s)/side effects and non-pharmacologic comfort measures Outcome: Progressing   Problem: Health Behavior/Discharge Planning: Goal: Ability to manage health-related needs will improve Outcome: Progressing   Problem: Clinical Measurements: Goal: Ability to maintain clinical measurements within normal limits will improve Outcome: Progressing Goal: Respiratory complications will improve Outcome: Progressing   Problem: Nutrition: Goal: Adequate nutrition will be maintained Outcome: Progressing   Problem: Pain Managment: Goal: General experience of comfort will improve and/or be controlled Outcome: Progressing   Problem: Safety: Goal: Ability to remain free from injury will improve Outcome: Progressing

## 2024-11-02 NOTE — Progress Notes (Signed)
 Progress Note   Patient: Corey House. FMW:[id number] DOB: Mar 17, 1966 DOA: 10/20/2024     13 DOS: the patient was seen and examined on 11/02/2024   Brief hospital course: 58 y.o. year old male with past medical history of  tobacco use disorder reporting a 41 year pack per year history, smoking ~1 pack per day since he was 58 years old. He denies any additional medical history though he has not seen a medical provider in ~20 years. He presents to [facility] ED with shortness of breath that began ~ 9 months ago and worsened ~3 months ago. He reports today his dyspnea is not significantly different than it was 3 months ago. He endorses a chronic cough that has been present since childhood. Her ports the cough is currently, minimally productive with white phlegm. Additionally, he endorses bilateral lower extremity swelling that began in the last 2 days, per his report (R) > (L). On exam the swelling is equal bilaterally. He endorses fatigue and dyspnea that is worse with position changes such as bending over and when standing from lying. He denies chest pain, palpitations, or fever.   ED Course: On arrival to [facility] ED patient was noted to be afebrile temp 36.8C, BP 142/111, HR 88, RR 22, SPO2 100% on 15L non-rebreather. Patient now on 2L via nasal cannula with SPO2 96%. CXR obtained and shows non specific findings of increased interstitial markings. CTA PE obtained and shows no PE, dilated RV and reflux of contract into the IVC and hepatic vein consistent with (R) heart failure, chronic bronchitis, and emphysema. US  DVT (B)LE negative for DVT. Labs notable Negative COVID, flu, RSV,for BNP 284.2, BUN 24, troponin 41--> 38 with no report of chest pain.  Lactic acid 0.9.  Blood cultures drawn and in process.  He was given Lasix, DuoNebs, and Solu-Medrol .  TRH contacted for admission.  11/5.  Pulse ox with ambulation down to 85%.  Patient admitted with COPD and CHF exacerbation.  Patient  also states that he is short of breath and he is waiting to die.  Appreciate psychiatric consultation.  Patient is homeless. 11/6.  Patient states that he has been sleepy.  Venous blood gas shows a pCO2 of 79 with a pH of 7.41.  Will start BiPAP at night.  Try to see if we can taper off oxygen. 11/7.  Patient hypoxic with walking around with pulmonary to 87%. 11/8.  Patient's pulse ox dropped down to 84% with ambulation. 11/9.  Patient had a pulse ox of 78% on room air at rest but he stated he walked back from the bathroom. 11/10.  Pulse ox in the low 80s with ambulation.  Likely will need chronic oxygen.  Assessment and Plan: * Acute respiratory failure with hypoxia and hypercapnia (HCC) 11/14 patient working with occupational therapy and O2 sats on room air 85 to 88%. Patient's oxygen was left off for a couple of hours and his ABG showed pO2 of 59 mmHg and O2 sat of 87%.    11/16 states no dyspnea at rest, mostly with ambulation.   11/17 S/p prednisone  course, continue azithromycin    TOC continues to work on disposition for patient given his oxygen requirement  COPD with acute exacerbation (HCC) Patient on Zithromax , on long prednisone  taper and nebulizer treatments.  Pulmonology is following inpatient  Acute on chronic diastolic CHF (congestive heart failure) (HCC) Resolved EF 55 to 60%.  Initially on IV Lasix. CT scan commenting on enlarged RV.  No current  signs of heart failure. 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  -Does not appear volume overloaded -Continue to treat underlying cause  -May require right heart cath prior to discharge  Tobacco use disorder Discussed smoking cessation, continue nicotine  patch  Headache Resolved Increased anion gap metabolic acidosis Resolved Hypernatremia Resolved  Depression Patient on  Lexapro  and Seroquel  at night  Protein-calorie malnutrition, severe Continue supplements        Subjective: No issues overnight.   Physical Exam: Vitals:   11/01/24 2033 11/02/24 0417 11/02/24 0500 11/02/24 1615  BP: 106/79 105/74  115/81  Pulse: 80 75  74  Resp: 19 17  18   Temp: 98.2 F (36.8 C) 97.7 F (36.5 C)  98.5 F (36.9 C)  TempSrc:  Oral  Oral  SpO2: 90% 94%  97%  Weight:   53 kg   Height:       Physical Exam  Constitutional: In no distress.  Cardiovascular: Normal rate, regular rhythm. No lower extremity edema  Pulmonary: Non labored breathing on [place], no wheezing or rales.   Abdominal: Soft. Non distended and non tender Musculoskeletal: Normal range of motion.     Neurological: Alert and oriented to person, place, and time. Non focal  Skin: Skin is warm and dry.         Data Reviewed:    Latest Ref Rng & Units 10/28/2024    4:38 AM 10/26/2024    4:41 AM 10/21/2024    5:23 AM  CBC  WBC 4.0 - 10.5 K/uL 6.6  5.7  3.3   Hemoglobin 13.0 - 17.0 g/dL 85.0  84.7  85.1   Hematocrit 39.0 - 52.0 % 46.8  48.7  46.7   Platelets 150 - 400 K/uL 286  287  251       Latest Ref Rng & Units 11/01/2024    5:17 AM 10/29/2024    6:11 AM 10/28/2024    4:38 AM  BMP  Glucose 70 - 99 mg/dL 88  86  97   BUN 6 - 20 mg/dL 20  20  23    Creatinine 0.61 - 1.24 mg/dL 9.28  9.38  9.33   Sodium 135 - 145 mmol/L 144  141  143   Potassium 3.5 - 5.1 mmol/L 4.6  4.2  4.5   Chloride 98 - 111 mmol/L 99  97  96   CO2 22 - 32 mmol/L 36  39  41   Calcium 8.9 - 10.3 mg/dL 9.1  9.0  9.2      Disposition: Status is: Inpatient Remains inpatient appropriate because: Since patient is unhomed and currently unable to get off oxygen we are in a tough situation.  We do not have a safe disposition  Planned Discharge Destination: Will need some sort of placement    Time spent: 25 minutes  Author: Alban Pepper, MD 11/02/2024 4:31 PM  For on call review [email or web address].

## 2024-11-02 NOTE — Progress Notes (Signed)
 PULMONOLOGY         Date: 11/02/2024,   MRN# [id number] Corey House. [birth year]     AdmissionWeight: 58.8 kg                 CurrentWeight: 53 kg  Referring provider: Dr Josette   CHIEF COMPLAINT:   Acute exacerbation of CHF and COPD   HISTORY OF PRESENT ILLNESS   This is a 58 yo M with hx of chronic cough, COPD, CHF, Urolithiasis, tobacco dependence who came in due to worsening SOB.  [name] hypoxemic requiring HHFNC at 15L/min to reach normoxia.  Since admission he has partially improved with weaning down of O2 req.  He had CT chest with PE protocol and was found to have chronic bronchitic COPD and centrilobular emphysema with no venous thromboemoblism.  He also had noted RV dilation suggestive of RV failure.  Cardiac biomarkers were mildy elevated consisted with acute on chronic CHF. He had septic workup performed and had steroids with antibiotics prescribed. He also had DVT studies and this was negative for bilateral LE dvt.  Patient requested pulmonary evaluation and for this reason consultation was placed.   10/22/24- patient is improved, CXR repeating today. Preparing for dc home in AM. Plan to wean steroids and abx to PO regimen on outpatient basis.   10/23/24- patient is able to ambulate around hallway.  He did have very transient mild hypoxemia. He is improved quite a bit. We discussed smoking cessation.  He is concerned about his homelessness. His blood work is improved and blood gas is with chronic hypercapnic respiratory failure.   10/24/24- patient is resting in bed comfortably, reports expectoration of mucus this am and feels slightly better.  He was able to walk around hallway today. CXR is clear.  Plan to wean off oxygen and dc home when possible.  He's on zithromax  250 PO and prednisone  taper. He should use IS and flutter valve.   10/25/24- patient continues to desaturate while ambulating.  He drops his oxygen down to low 80s with slow  walking. Difficult situation with homelessness and needing oxygen therapy and possible BIPAP  10/26/24- patient reports feeling week and dyspneic in bed. He is working on obtaining placement.  He is with advanced COPD.   10/27/24- patient laying in bed in NAD.  His bloodwork is stable. He reports dyspnea at rest. He will need oxygen upon dc it seems due to recurrent hypoxemia.  10/28/24 - patient is laying in bed in do distress. He is on 3L/min [place].  He reports dyspnea at rest but does not appear to be using accessory muscles of respirations and is not tachypneic on examination. His lung auscultation is clear bilaterally.   10/29/24- patient reports anxiety while resting in bed this morning.  He asks for xanax during my interview.  Ive asked RN to perform exertional ambulation for oxygen requirement on room air this morning.  He appears to be resting in bed in no distress with no adventitious lung sounds on auscultation.  Hopefully can dc out of hospital on ambient air today  10/30/24- patient feels better.  We tried to ambulate on room air but he did have desaturation again. Overall he seems less dyspneic and reports able to ambulate further with less effort.  He has trouble with discharge due to homelessness and O2 needs  10/31/24- Patient states this morning, he wishes to be dcd.  He reports he would be agreeable to  leave without oxygen if he is able.  He has TOC team working with him for disability  coverage.  His hypoxemia is mild on exertion around 88%.  He reports if things get worse he can come back to ER but is agreeable to stay here as well.   11/01/24- patient on room air.  He feels better, has not had PT/OT today to check on oxygen desaturation.  He is working on disability to help with coverage and housing. He is on prednisone  5mg  and zithromax  po 250.  He's stable from pulmonary perspective  PAST MEDICAL HISTORY   Past Medical History:  Diagnosis Date   Chronic cough    Urolithiasis     Urolithiasis      SURGICAL HISTORY   Past Surgical History:  Procedure Laterality Date   CATARACT EXTRACTION W/PHACO Left 08/09/2021   Procedure: CATARACT EXTRACTION PHACO AND INTRAOCULAR LENS PLACEMENT (IOC) LEFT .051 00:10.0;  Surgeon: Mittie Gaskin, MD;  Location: [facility];  Service: Ophthalmology;  Laterality: Left;   CATARACT EXTRACTION W/PHACO Right 10/04/2021   Procedure: CATARACT EXTRACTION PHACO AND INTRAOCULAR LENS PLACEMENT (IOC) RIGHT 0.67 00:13.8;  Surgeon: Mittie Gaskin, MD;  Location: [facility];  Service: Ophthalmology;  Laterality: Right;  general anesthesia needs to be last case   HERNIA REPAIR     x2   MANDIBLE FRACTURE SURGERY       FAMILY HISTORY   History reviewed. No pertinent family history.   SOCIAL HISTORY   Social History   Tobacco Use   Smoking status: Every Day    Current packs/day: 1.00    Average packs/day: 1 pack/day for 35.0 years (35.0 ttl pk-yrs)    Types: Cigarettes   Smokeless tobacco: Never   Tobacco comments:    Started smoking age 79  Vaping Use   Vaping status: Never Used  Substance Use Topics   Alcohol use: No   Drug use: Not Currently     MEDICATIONS    Home Medication:    Current Medication:  Current Facility-Administered Medications:    acetaminophen  (TYLENOL ) tablet 650 mg, 650 mg, Oral, Q6H PRN, Josette Ade, MD, 650 mg at 11/01/24 2205   albuterol  (PROVENTIL ) (2.5 MG/3ML) 0.083% nebulizer solution 2.5 mg, 2.5 mg, Nebulization, Q2H PRN, Foust, Katy L, NP, 2.5 mg at 10/23/24 9176   ALPRAZolam (XANAX) tablet 0.25 mg, 0.25 mg, Oral, TID PRN, Josette Ade, MD, 0.25 mg at 11/01/24 2205   artificial tears ophthalmic solution 1 drop, 1 drop, Both Eyes, PRN, Foust, Katy L, NP   azithromycin  (ZITHROMAX ) tablet 250 mg, 250 mg, Oral, Daily, Damani Rando, MD, 250 mg at 11/01/24 0857   enoxaparin  (LOVENOX ) injection 40 mg, 40 mg, Subcutaneous, Q24H, Foust, Katy L, NP, 40 mg at  11/01/24 1645   escitalopram  (LEXAPRO ) tablet 10 mg, 10 mg, Oral, Daily, Smith, Annie B, NP, 10 mg at 11/01/24 0857   guaiFENesin  (MUCINEX ) 12 hr tablet 600 mg, 600 mg, Oral, BID, Foust, Katy L, NP, 600 mg at 11/01/24 2205   melatonin tablet 5 mg, 5 mg, Oral, QHS PRN, Foust, Katy L, NP, 5 mg at 10/29/24 2129   multivitamin with minerals tablet 1 tablet, 1 tablet, Oral, Daily, Wieting, Richard, MD, 1 tablet at 11/01/24 0857   nicotine  (NICODERM CQ  - dosed in mg/24 hours) patch 14 mg, 14 mg, Transdermal, Daily, Foust, Katy L, NP, 14 mg at 11/01/24 0900   nicotine  polacrilex (NICORETTE) gum 2 mg, 2 mg, Oral, PRN, Foust, Rockie CROME, NP  Oral care mouth rinse, 15 mL, Mouth Rinse, PRN, Josette, Richard, MD   pantoprazole (PROTONIX) EC tablet 40 mg, 40 mg, Oral, Daily, Foust, Katy L, NP, 40 mg at 11/01/24 0857   [COMPLETED] predniSONE  (DELTASONE ) tablet 50 mg, 50 mg, Oral, Q breakfast, 50 mg at 10/24/24 0841 **FOLLOWED BY** [COMPLETED] predniSONE  (DELTASONE ) tablet 45 mg, 45 mg, Oral, Q breakfast, 45 mg at 10/25/24 0930 **FOLLOWED BY** [COMPLETED] predniSONE  (DELTASONE ) tablet 40 mg, 40 mg, Oral, Q breakfast, 40 mg at 10/26/24 0945 **FOLLOWED BY** [COMPLETED] predniSONE  (DELTASONE ) tablet 35 mg, 35 mg, Oral, Q breakfast, 35 mg at 10/27/24 0959 **FOLLOWED BY** [COMPLETED] predniSONE  (DELTASONE ) tablet 30 mg, 30 mg, Oral, Q breakfast, 30 mg at 10/28/24 0849 **FOLLOWED BY** [COMPLETED] predniSONE  (DELTASONE ) tablet 25 mg, 25 mg, Oral, Q breakfast, 25 mg at 10/29/24 0800 **FOLLOWED BY** [COMPLETED] predniSONE  (DELTASONE ) tablet 20 mg, 20 mg, Oral, Q breakfast, 20 mg at 10/30/24 0818 **FOLLOWED BY** [COMPLETED] predniSONE  (DELTASONE ) tablet 15 mg, 15 mg, Oral, Q breakfast, 15 mg at 10/31/24 0828 **FOLLOWED BY** [COMPLETED] predniSONE  (DELTASONE ) tablet 10 mg, 10 mg, Oral, Q breakfast, 10 mg at 11/01/24 0857 **FOLLOWED BY** predniSONE  (DELTASONE ) tablet 5 mg, 5 mg, Oral, Q breakfast, Tolbert Matheson, MD   QUEtiapine   (SEROQUEL ) tablet 25 mg, 25 mg, Oral, QHS, Smith, Annie B, NP, 25 mg at 11/01/24 2205    ALLERGIES   Patient has no known allergies.     REVIEW OF SYSTEMS    Review of Systems:  Gen:  Denies  fever, sweats, chills weigh loss  HEENT: Denies blurred vision, double vision, ear pain, eye pain, hearing loss, nose bleeds, sore throat Cardiac:  No dizziness, chest pain or heaviness, chest tightness,edema Resp:   reports dyspnea chronically  Gi: Denies swallowing difficulty, stomach pain, nausea or vomiting, diarrhea, constipation, bowel incontinence Gu:  Denies bladder incontinence, burning urine Ext:   Denies Joint pain, stiffness or swelling Skin: Denies  skin rash, easy bruising or bleeding or hives Endoc:  Denies polyuria, polydipsia , polyphagia or weight change Psych:   Denies depression, insomnia or hallucinations   Other:  All other systems negative   VS: BP 105/74 (BP Location: Left Arm)   Pulse 75   Temp 97.7 F (36.5 C) (Oral)   Resp 17   Ht 5' 4 (1.626 m)   Wt 53 kg   SpO2 94%   BMI 20.06 kg/m      PHYSICAL EXAM    GENERAL:NAD, no fevers, chills, no weakness no fatigue HEAD: Normocephalic, atraumatic.  EYES: Pupils equal, round, reactive to light. Extraocular muscles intact. No scleral icterus.  MOUTH: Moist mucosal membrane. Dentition intact. No abscess noted.  EAR, NOSE, THROAT: Clear without exudates. No external lesions.  NECK: Supple. No thyromegaly. No nodules. No JVD.  PULMONARY: decreased breath sounds clear to auscultation CARDIOVASCULAR: S1 and S2. Regular rate and rhythm. No murmurs, rubs, or gallops. No edema. Pedal pulses 2+ bilaterally.  GASTROINTESTINAL: Soft, nontender, nondistended. No masses. Positive bowel sounds. No hepatosplenomegaly.  MUSCULOSKELETAL: No swelling, clubbing, or edema. Range of motion full in all extremities.  NEUROLOGIC: Cranial nerves II through XII are intact. No gross focal neurological deficits. Sensation intact.  Reflexes intact.  SKIN: No ulceration, lesions, rashes, or cyanosis. Skin warm and dry. Turgor intact.  PSYCHIATRIC: Mood, affect within normal limits. The patient is awake, alert and oriented x 3. Insight, judgment intact.       IMAGING     rative & Impression  EXAM: CTA of the Chest  with contrast for PE 10/20/2024 02:19:01 PM   TECHNIQUE: CTA of the chest was performed without and with the administration of 75 mL of iohexol  (OMNIPAQUE ) 350 MG/ML injection. Multiplanar reformatted images are provided for review. MIP images are provided for review. Automated exposure control, iterative reconstruction, and/or weight based adjustment of the mA/kV was utilized to reduce the radiation dose to as low as reasonably achievable.   COMPARISON: Same day x-ray and CTA chest 01/04/2023.   CLINICAL HISTORY: Pulmonary embolism (PE) suspected, high prob.   FINDINGS:   PULMONARY ARTERIES: Pulmonary arteries are adequately opacified for evaluation. Negative for pulmonary embolism. Main pulmonary artery is normal in caliber.   MEDIASTINUM: Dilated right ventricle with reflux of contrast into the IVC and hepatic veins compatible with elevated right heart pressures. There is no acute abnormality of the thoracic aorta.   LYMPH NODES: No mediastinal, hilar or axillary lymphadenopathy.   LUNGS AND PLEURA: Diffuse bronchial wall thickening. Mucous plugging in the lower lobes. Emphysema. No focal consolidation or pulmonary edema. No pleural effusion or pneumothorax.   UPPER ABDOMEN: Limited images of the upper abdomen are unremarkable.   SOFT TISSUES AND BONES: No acute bone or soft tissue abnormality.   IMPRESSION: 1. No pulmonary embolism. 2. Dilated right ventricle with reflux of contrast into the IVC and hepatic veins, compatible with elevated right heart pressures. 3. Chronic bronchitis. 4. Emphysema. Given patient age, consider evaluation for low-dose CT lung cancer  screening.   Electronically signed by: Norman Gatlin MD 10/20/2024 02:49 PM EST RP Workstation: [id number]      ASSESSMENT/PLAN     Acute exacerbation of COPD- RESOLVED   - continue nebulizer therapy Duoneb 16h prn   - s/p solumedrol 125x1 followed by 40mg  IV daily -wean to pred  -adding zithromax  PO - PT/OT -Incentive spirometry and flutter valve    Mucus plugging of tracheobronchial tree-RESOLVED     - considering outpatient bronchoscopy    Tobacco dependence   - 14mg  transdermal patch     Enlarged Right ventricle Suggestive of pulmonary hypertension - would recommend RHC via cardiology service when possible non urgently    Psychiatric history   Patient is currently being evaluated by psychiatrist     Chronic hypoxemia    -  Mild hypoxemia noted on exertional walk.  TOC working on disability for insurance coverage.  He does have advanced emphysema with hypoxemia and homelesness.            Thank you for allowing me to participate in the care of this patient.   Patient/Family are satisfied with care plan and all questions have been answered.    Provider disclosure: Patient with at least one acute or chronic illness or injury that poses a threat to life or bodily function and is being managed actively during this encounter.  All of the below services have been performed independently by signing provider:  review of prior documentation from internal and or external health records.  Review of previous and current lab results.  Interview and comprehensive assessment during patient visit today. Review of current and previous chest radiographs/CT scans. Discussion of management and test interpretation with health care team and patient/family.   This document was prepared using Dragon voice recognition software and may include unintentional dictation errors.     Gabreille Dardis, M.D.  Division of Pulmonary & Critical Care Medicine

## 2024-11-03 DIAGNOSIS — J9602 Acute respiratory failure with hypercapnia: Secondary | ICD-10-CM | POA: Diagnosis not present

## 2024-11-03 DIAGNOSIS — J9601 Acute respiratory failure with hypoxia: Secondary | ICD-10-CM | POA: Diagnosis not present

## 2024-11-03 LAB — BASIC METABOLIC PANEL WITH GFR
Anion gap: 5 (ref 5–15)
BUN: 19 mg/dL (ref 6–20)
CO2: 35 mmol/L — ABNORMAL HIGH (ref 22–32)
Calcium: 8.6 mg/dL — ABNORMAL LOW (ref 8.9–10.3)
Chloride: 98 mmol/L (ref 98–111)
Creatinine, Ser: 0.54 mg/dL — ABNORMAL LOW (ref 0.61–1.24)
GFR, Estimated: >60 mL/min (ref >60)
Glucose, Bld: 87 mg/dL (ref 70–99)
Potassium: 4.5 mmol/L (ref 3.5–5.1)
Sodium: 138 mmol/L (ref 135–145)

## 2024-11-03 NOTE — Plan of Care (Signed)
  Problem: Education: Goal: Knowledge of General Education information will improve Description: Including pain rating scale, medication(s)/side effects and non-pharmacologic comfort measures Outcome: Progressing   Problem: Activity: Goal: Risk for activity intolerance will decrease Outcome: Progressing   Problem: Nutrition: Goal: Adequate nutrition will be maintained Outcome: Progressing   Problem: Pain Managment: Goal: General experience of comfort will improve and/or be controlled Outcome: Progressing   Problem: Safety: Goal: Ability to remain free from injury will improve Outcome: Progressing   Problem: Activity: Goal: Ability to tolerate increased activity will improve Outcome: Progressing   Problem: Respiratory: Goal: Ability to maintain a clear airway will improve Outcome: Progressing Goal: Levels of oxygenation will improve Outcome: Progressing

## 2024-11-03 NOTE — TOC Progression Note (Signed)
 Transition of Care (TOC) - Progression Note    Patient Details  Name: Corey House. MRN: [id number] Date of Birth: [birth year]  Transition of Care [facility]) CM/SW Contact  Victory Jackquline RAMAN, RN Phone Number: 11/03/2024, 3:21 PM  Clinical Narrative:   RNCM received a message via secure chat from the bedside nurse asking if the patient was going to be discharged to [facility]. RNCM called Zelvia with [facility] at [phone] to follow up on the group home search and she informed me that they have just started the search and that this isn't going to be an easy find because he doesn't have any money and once they find a Mental Health Group Home they will have to get state funding. Bedside nurse and Leadership made aware. RNCM will continue to follow for discharge planning needs.    Expected Discharge Plan:  (patient is currently homeless, unsure of discharge location at this time) Barriers to Discharge: Continued Medical Work up, Other (must enter comment) (homeless)               Expected Discharge Plan and Services   Discharge Planning Services: CM Consult   Living arrangements for the past 2 months: Homeless Expected Discharge Date: 11/03/24                                     Social Drivers of Health (SDOH) Interventions SDOH Screenings   Food Insecurity: Food Insecurity Present (10/20/2024)  Housing: High Risk (10/21/2024)  Transportation Needs: Unmet Transportation Needs (10/20/2024)  Utilities: At Risk (10/20/2024)  Alcohol Screen: Low Risk  (04/23/2024)  Social Connections: Unknown (04/23/2024)  Tobacco Use: High Risk (10/21/2024)    Readmission Risk Interventions     No data to display

## 2024-11-03 NOTE — Progress Notes (Signed)
 Rounded on patient. Patient resting in bed with O2 via [place] in place. Patient updated on current discharge plan efforts. Patient voiced understanding and expresses gratefulness for the assistance.  Patient denies any unmet needs at present time. Encouraged to call with any questions or concerns.

## 2024-11-03 NOTE — Progress Notes (Signed)
 Progress Note   Patient: Corey House. FMW:[id number] DOB: 1966/05/05 DOA: 10/20/2024     14 DOS: the patient was seen and examined on 11/03/2024   Brief hospital course: 58 y.o. year old male with past medical history of  tobacco use disorder reporting a 41 year pack per year history, smoking ~1 pack per day since he was 58 years old. He denies any additional medical history though he has not seen a medical provider in ~20 years. He presents to [facility] ED with shortness of breath that began ~ 9 months ago and worsened ~3 months ago. He reports today his dyspnea is not significantly different than it was 3 months ago. He endorses a chronic cough that has been present since childhood. Her ports the cough is currently, minimally productive with white phlegm. Additionally, he endorses bilateral lower extremity swelling that began in the last 2 days, per his report (R) > (L). On exam the swelling is equal bilaterally. He endorses fatigue and dyspnea that is worse with position changes such as bending over and when standing from lying. He denies chest pain, palpitations, or fever.   ED Course: On arrival to [facility] ED patient was noted to be afebrile temp 36.8C, BP 142/111, HR 88, RR 22, SPO2 100% on 15L non-rebreather. Patient now on 2L via nasal cannula with SPO2 96%. CXR obtained and shows non specific findings of increased interstitial markings. CTA PE obtained and shows no PE, dilated RV and reflux of contract into the IVC and hepatic vein consistent with (R) heart failure, chronic bronchitis, and emphysema. US  DVT (B)LE negative for DVT. Labs notable Negative COVID, flu, RSV,for BNP 284.2, BUN 24, troponin 41--> 38 with no report of chest pain.  Lactic acid 0.9.  Blood cultures drawn and in process.  He was given Lasix, DuoNebs, and Solu-Medrol .  TRH contacted for admission.  11/5.  Pulse ox with ambulation down to 85%.  Patient admitted with COPD and CHF exacerbation.  Patient  also states that he is short of breath and he is waiting to die.  Appreciate psychiatric consultation.  Patient is homeless. 11/6.  Patient states that he has been sleepy.  Venous blood gas shows a pCO2 of 79 with a pH of 7.41.  Will start BiPAP at night.  Try to see if we can taper off oxygen. 11/7.  Patient hypoxic with walking around with pulmonary to 87%. 11/8.  Patient's pulse ox dropped down to 84% with ambulation. 11/9.  Patient had a pulse ox of 78% on room air at rest but he stated he walked back from the bathroom. 11/10.  Pulse ox in the low 80s with ambulation.  Likely will need chronic oxygen.  Assessment and Plan: * Acute respiratory failure with hypoxia and hypercapnia (HCC) 11/14 patient working with occupational therapy and O2 sats on room air 85 to 88%. Patient's oxygen was left off for a couple of hours and his ABG showed pO2 of 59 mmHg and O2 sat of 87%.    11/16 states no dyspnea at rest, mostly with ambulation.   11/17 S/p prednisone  course, continue azithromycin    11/18 Continue azithromycin    TOC continues to work on disposition for patient given his oxygen requirement, he is medically stable and will be sent to group home when available.   COPD with acute exacerbation (HCC) improving Metabolic alkalosis in setting of chronic hypercapnic resp failure  Patient on Zithromax , s/p prednisone  taper and nebulizer treatments.  Pulmonology is following inpatient  Acute on chronic diastolic CHF (congestive heart failure) (HCC) Resolved EF 55 to 60%.  Initially on IV Lasix. CT scan commenting on enlarged RV.  No current signs of heart failure. 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  -Does not appear volume overloaded -Continue to treat underlying cause  -May require right heart cath prior to discharge  Tobacco use  disorder Discussed smoking cessation, continue nicotine  patch  Headache Resolved Increased anion gap metabolic acidosis Resolved Hypernatremia Resolved  Depression Patient on Lexapro  and Seroquel  at night  Protein-calorie malnutrition, severe Continue supplements        Subjective: No issues overnight.   Physical Exam: Vitals:   11/02/24 2013 11/03/24 0337 11/03/24 0500 11/03/24 0718  BP: 117/82 111/72  106/72  Pulse: 73 69  72  Resp: 16 16  14   Temp: 98.3 F (36.8 C) (!) 97.5 F (36.4 C)  97.7 F (36.5 C)  TempSrc:    Oral  SpO2: 96% 98%  98%  Weight:   53 kg   Height:       Physical Exam  Constitutional: In no distress.  Cardiovascular: Normal rate, regular rhythm. No lower extremity edema  Pulmonary: Non labored breathing on [place], no wheezing or rales.   Abdominal: Soft. Non distended and non tender Musculoskeletal: Normal range of motion.     Neurological: Alert and oriented to person, place, and time. Non focal  Skin: Skin is warm and dry.         Data Reviewed:    Latest Ref Rng & Units 10/28/2024    4:38 AM 10/26/2024    4:41 AM 10/21/2024    5:23 AM  CBC  WBC 4.0 - 10.5 K/uL 6.6  5.7  3.3   Hemoglobin 13.0 - 17.0 g/dL 85.0  84.7  85.1   Hematocrit 39.0 - 52.0 % 46.8  48.7  46.7   Platelets 150 - 400 K/uL 286  287  251       Latest Ref Rng & Units 11/03/2024    7:51 AM 11/01/2024    5:17 AM 10/29/2024    6:11 AM  BMP  Glucose 70 - 99 mg/dL 87  88  86   BUN 6 - 20 mg/dL 19  20  20    Creatinine 0.61 - 1.24 mg/dL 9.45  9.28  9.38   Sodium 135 - 145 mmol/L 138  144  141   Potassium 3.5 - 5.1 mmol/L 4.5  4.6  4.2   Chloride 98 - 111 mmol/L 98  99  97   CO2 22 - 32 mmol/L 35  36  39   Calcium 8.9 - 10.3 mg/dL 8.6  9.1  9.0      Disposition: Status is: Inpatient Remains inpatient appropriate because: Since patient is unhomed and currently unable to get off oxygen we are in a tough situation.  We do not have a safe disposition  Planned  Discharge Destination: Will need some sort of placement    Time spent: 25 minutes  Author: Alban Pepper, MD 11/03/2024 3:06 PM  For on call review [email or web address].

## 2024-11-03 NOTE — Plan of Care (Signed)
   Problem: Education: Goal: Knowledge of General Education information will improve Description Including pain rating scale, medication(s)/side effects and non-pharmacologic comfort measures Outcome: Progressing   Problem: Health Behavior/Discharge Planning: Goal: Ability to manage health-related needs will improve Outcome: Progressing

## 2024-11-03 NOTE — Progress Notes (Signed)
 Occupational Therapy Treatment Patient Details Name: Corey House. MRN: [id number] DOB: 01-Nov-1966 Today's Date: 11/03/2024   History of present illness Pt is a 58 y.o. year old male with past medical history of tobacco use. He presents to [facility] ED with shortness of breath. He has dyspnea at rest and on exertion with a chronic cough that has been present since childhood. MD dx includes: Emphysema and hypoxia.   OT comments  Pt found supine in bed, agreeable to OT treatment session. He declines need to perform ADLs and ambulation during session. Agreeable to education on BUE strengthening exercises with red theraband and handout provided to maximize carryover and pt demo back with accuracy. He has been up ambulating to the bathroom on his own. On 3L throughout session with sp02 95% throughout. OT goals have been met and all education provided, will sign off in house at this time.      If plan is discharge home, recommend the following:  Assist for transportation;A little help with bathing/dressing/bathroom   Equipment Recommendations  None recommended by OT    Recommendations for Other Services      Precautions / Restrictions Precautions Precautions: Fall Recall of Precautions/Restrictions: Intact Restrictions Weight Bearing Restrictions Per Provider Order: No       Mobility Bed Mobility Overal bed mobility: Independent                  Transfers                         Balance Overall balance assessment: Modified Independent Sitting-balance support: Feet supported Sitting balance-Leahy Scale: Normal                                     ADL either performed or assessed with clinical judgement   ADL Overall ADL's : Modified independent                                       General ADL Comments: declined ADLs this session d/t not feeling up to it    Extremity/Trunk Assessment              Vision        Perception     Praxis     Communication Communication Communication: No apparent difficulties   Cognition Arousal: Alert Behavior During Therapy: WFL for tasks assessed/performed, Anxious                                 Following commands: Intact        Cueing      Exercises Other Exercises Other Exercises: Provided BUE red theraband and written HEP to maximize carryover with pt demo back exercises for 5 reps x1 set with min cueing. Edu on importance of pacing and breathing techniques throughout. Sp02 94-95% on 3L throughout session.    Shoulder Instructions       General Comments      Pertinent Vitals/ Pain       Pain Assessment Pain Assessment: No/denies pain Pain Intervention(s): Monitored during session  Home Living  Prior Functioning/Environment              Frequency  Min 1X/week        Progress Toward Goals  OT Goals(current goals can now be found in the care plan section)  Progress towards OT goals: Goals met/education completed, patient discharged from OT     Plan      Co-evaluation                 AM-PAC OT 6 Clicks Daily Activity     Outcome Measure   Help from another person eating meals?: None Help from another person taking care of personal grooming?: None Help from another person toileting, which includes using toliet, bedpan, or urinal?: None Help from another person bathing (including washing, rinsing, drying)?: None Help from another person to put on and taking off regular upper body clothing?: None Help from another person to put on and taking off regular lower body clothing?: None 6 Click Score: 24    End of Session Equipment Utilized During Treatment: Oxygen  OT Visit Diagnosis: Other abnormalities of gait and mobility (R26.89)   Activity Tolerance Patient tolerated treatment well   Patient Left in bed;with call bell/phone within  reach   Nurse Communication Mobility status        Time: 8388-8376 OT Time Calculation (min): 12 min  Charges: OT General Charges $OT Visit: 1 Visit OT Treatments $Therapeutic Exercise: 8-22 mins  Emmalyn Hinson Chrismon, OTR/L  11/03/24, 5:06 PM   Judithann Villamar E Chrismon 11/03/2024, 5:04 PM

## 2024-11-04 DIAGNOSIS — J9601 Acute respiratory failure with hypoxia: Secondary | ICD-10-CM | POA: Diagnosis not present

## 2024-11-04 DIAGNOSIS — J9602 Acute respiratory failure with hypercapnia: Secondary | ICD-10-CM | POA: Diagnosis not present

## 2024-11-04 LAB — BASIC METABOLIC PANEL WITH GFR
Anion gap: 7 (ref 5–15)
BUN: 19 mg/dL (ref 6–20)
CO2: 35 mmol/L — ABNORMAL HIGH (ref 22–32)
Calcium: 9.1 mg/dL (ref 8.9–10.3)
Chloride: 96 mmol/L — ABNORMAL LOW (ref 98–111)
Creatinine, Ser: 0.79 mg/dL (ref 0.61–1.24)
GFR, Estimated: >60 mL/min (ref >60)
Glucose, Bld: 87 mg/dL (ref 70–99)
Potassium: 4.9 mmol/L (ref 3.5–5.1)
Sodium: 138 mmol/L (ref 135–145)

## 2024-11-04 NOTE — Progress Notes (Signed)
 Progress Note    Corey House.  FMW:[id number] DOB: Nov 03, 1966  DOA: 10/20/2024 PCP: Patient, [name]      Brief Narrative:    Medical records reviewed and are as summarized below:  Corey LELON [name]. is a 58 y.o. male with medical history significant for tobacco use disorder (smokes a pack of cigarettes a day since he was 58 years old) has not seen a medical provider in about 20 years.  He presented to the hospital with shortness of breath that started about 9 months prior to admission.  However, shortness of breath has progressively worsened in the 3 months preceding admission.  He also complained of chronic cough but reported that cough had become productive of whitish sputum.  He reported bilateral lower extremity swelling that he noticed about 2 days prior to admission.  He also complained of easy fatigability and shortness of breath with minimal activity.  ED Course: On arrival to [facility] ED patient was noted to be afebrile temp 36.8C, BP 142/111, HR 88, RR 22, SPO2 100% on 15L non-rebreather. Patient now on 2L via nasal cannula with SPO2 96%.    CTA chest IMPRESSION: 1. No pulmonary embolism. 2. Dilated right ventricle with reflux of contrast into the IVC and hepatic veins, compatible with elevated right heart pressures. 3. Chronic bronchitis. 4. Emphysema. Given patient age, consider evaluation for low-dose CT lung cancer screening.   He was admitted to the hospital for COPD and CHF exacerbation.   Assessment/Plan:   Principal Problem:   Acute respiratory failure with hypoxia and hypercapnia (HCC) Active Problems:   COPD with acute exacerbation (HCC)   Acute on chronic diastolic CHF (congestive heart failure) (HCC)   Tobacco use disorder   Headache   Increased anion gap metabolic acidosis   Hypernatremia   Protein-calorie malnutrition, severe   Depression   Nutrition Problem: Severe Malnutrition Etiology: social / environmental  circumstances  Signs/Symptoms: moderate fat depletion, severe fat depletion, moderate muscle depletion, severe muscle depletion, edema   Body mass index is 19.98 kg/m.   Acute on chronic hypoxic and hypercapnic respiratory failure: Continue 3 L/min oxygen via nasal cannula.  Wean off oxygen as able.  He is unable to tolerate BiPAP.   COPD exacerbation: Improved.  Completed course of steroids.  Continue bronchodilators.   Acute on chronic diastolic CHF: Improved.  S/p treatment with IV Lasix. 2D echo showed EF estimated at 55 to 60%, normal LV diastolic parameters, mildly reduced RV systolic function, mildly enlarged RV size, moderately increased RV wall thickness, mildly elevated pulmonary artery systolic pressure.   Headache: Improved   Hypernatremia: Improved   Comorbidities include tobacco use disorder, depression, severe protein caloric malnutrition   Homelessness: Patient is homeless and unable to discharge to a shelter with oxygen.  Follow-up with TOC to assist with disposition.   Diet Order             Diet - low sodium heart healthy           Diet 2 gram sodium Fluid consistency: Thin  Diet effective now                                  Consultants: Pulmonologist  Procedures: None    Medications:    azithromycin   250 mg Oral Daily   enoxaparin  (LOVENOX ) injection  40 mg Subcutaneous Q24H   escitalopram   10 mg  Oral Daily   guaiFENesin   600 mg Oral BID   multivitamin with minerals  1 tablet Oral Daily   nicotine   14 mg Transdermal Daily   pantoprazole   40 mg Oral Daily   QUEtiapine   25 mg Oral QHS   Continuous Infusions:   Anti-infectives (From admission, onward)    Start     Dose/Rate Route Frequency Ordered Stop   10/21/24 1445  azithromycin  (ZITHROMAX ) tablet 250 mg        250 mg Oral Daily 10/21/24 1351                Family Communication/Anticipated D/C date and plan/Code Status   DVT prophylaxis:  enoxaparin  (LOVENOX ) injection 40 mg Start: 10/20/24 1800     Code Status: Full Code  Family Communication: None Disposition Plan: To be determined   Status is: Inpatient Remains inpatient appropriate because: Unable to wean off oxygen       Subjective:   Interval events noted.  He complains of fatigue.  No shortness of breath or chest pain.  Objective:    Vitals:   11/04/24 0317 11/04/24 0507 11/04/24 0838 11/04/24 1611  BP:  112/78 120/74 113/79  Pulse:  68 83 74  Resp:  18 14 16   Temp:  97.7 F (36.5 C) 98.2 F (36.8 C) 98.9 F (37.2 C)  TempSrc:  Oral    SpO2:  100% 100% 99%  Weight: 52.8 kg     Height:       No data found.   Intake/Output Summary (Last 24 hours) at 11/04/2024 1702 Last data filed at 11/04/2024 1300 Gross per 24 hour  Intake 480 ml  Output 1300 ml  Net -820 ml   Filed Weights   11/02/24 0500 11/03/24 0500 11/04/24 0317  Weight: 53 kg 53 kg 52.8 kg    Exam:   GEN: NAD SKIN: Warm and dry EYES: No pallor or icterus ENT: MMM CV: RRR PULM: CTA B ABD: soft, ND, NT, +BS CNS: AAO x 3, non focal EXT: No edema or tenderness        Data Reviewed:   I have personally reviewed following labs and imaging studies:  Labs: Labs show the following:   Basic Metabolic Panel: Recent Labs  Lab 10/29/24 0611 11/01/24 0517 11/03/24 0751 11/04/24 0422  NA 141 144 138 138  K 4.2 4.6 4.5 4.9  CL 97* 99 98 96*  CO2 39* 36* 35* 35*  GLUCOSE 86 88 87 87  BUN 20 20 19 19   CREATININE 0.61 0.71 0.54* 0.79  CALCIUM 9.0 9.1 8.6* 9.1   GFR Estimated Creatinine Clearance: 77 mL/min (by C-G formula based on SCr of 0.79 mg/dL). Liver Function Tests: No results for input(s): AST, ALT, ALKPHOS, BILITOT, PROT, ALBUMIN in the last 168 hours. No results for input(s): LIPASE, AMYLASE in the last 168 hours. No results for input(s): AMMONIA in the last 168 hours. Coagulation profile No results for input(s): INR, PROTIME  in the last 168 hours.  CBC: No results for input(s): WBC, NEUTROABS, HGB, HCT, MCV, PLT in the last 168 hours. Cardiac Enzymes: No results for input(s): CKTOTAL, CKMB, CKMBINDEX, TROPONINI in the last 168 hours. BNP (last 3 results) Recent Labs    10/29/24 0608  PROBNP <50.0   CBG: Recent Labs  Lab 11/01/24 0906  GLUCAP 134*   D-Dimer: No results for input(s): DDIMER in the last 72 hours. Hgb A1c: No results for input(s): HGBA1C in the last 72 hours. Lipid Profile: No results  for input(s): CHOL, HDL, LDLCALC, TRIG, CHOLHDL, LDLDIRECT in the last 72 hours. Thyroid function studies: No results for input(s): TSH, T4TOTAL, T3FREE, THYROIDAB in the last 72 hours.  Invalid input(s): FREET3 Anemia work up: No results for input(s): VITAMINB12, FOLATE, FERRITIN, TIBC, IRON, RETICCTPCT in the last 72 hours. Sepsis Labs: No results for input(s): PROCALCITON, WBC, LATICACIDVEN in the last 168 hours.  Microbiology No results found for this or any previous visit (from the past 240 hours).  Procedures and diagnostic studies:  No results found.             LOS: 15 days   Jeramiah Mccaughey  [employer] Loss Adjuster on [email or web address]. If 7PM-7AM, please contact night-coverage at [email or web address]     11/04/2024, 5:02 PM

## 2024-11-04 NOTE — Plan of Care (Signed)

## 2024-11-04 NOTE — Progress Notes (Signed)
 Nutrition Follow-up  DOCUMENTATION CODES:   Severe malnutrition in context of social or environmental circumstances  INTERVENTION:   -Continue 2 gram sodium diet -Continue MVI with minerals daily -Continue Magic cup TID with meals, each supplement provides 290 kcal and 9 grams of protein  -RD will sign off secondary to medical stability; if further nutrition-related needs arise, please re-consult RD   NUTRITION DIAGNOSIS:   Severe Malnutrition related to social / environmental circumstances as evidenced by moderate fat depletion, severe fat depletion, moderate muscle depletion, severe muscle depletion, edema.  Ongoing  GOAL:   Patient will meet greater than or equal to 90% of their needs  Progressing   MONITOR:   PO intake, Supplement acceptance  REASON FOR ASSESSMENT:   Malnutrition Screening Tool    ASSESSMENT:   58 y.o. year old male with past medical history of  tobacco use disorder reporting a 41 year pack per year history, smoking ~1 pack per day since he was 58 years old. He denies any additional medical history though he has not seen a medical provider in ~20 years. He presents with shortness of breath that began ~ 9 months ago and worsened ~3 months ago PTA. He reports today his dyspnea is not significantly different than it was 3 months ago PTA. He endorses a chronic cough that has been present since childhood.  Reviewed I/O's: -1.1 L x 24 hours and -4.2 L since 10/21/24   Patient lying in bed at time of visit. He was asleep, but seems very distressed (hand over forehead). He did not awaken when name was called. Noted breakfast tray was just delivered, however, he has not started eating yet.   Case discussed with RN, who reports fair appetite. Noted meal completions 50-100%.   Reviewed weight history. Weight has been stable over the past 7 days. Since admission, weight have been ranging from 49.9 kg- 53.1 kg; current weight on the higher end of this range. Noted  patient is -4.2 L since 10/21/24. Per MD notes, patient with history of CHF, but is currently euvolemic and no plans for further diuresis. Patient was on IV lasix  earlier this admission, which may account for some weight changes/ fluctuations as well as volume loss.   Per TOC notes, patient with difficult social situation (homeless, in need of oxygen). Placement and safe discharge plan are difficult secondary to these barriers. TOC, [facility] representative, and nurse navigator continue to follow for placement, likely group home. He is medically stable for discharge, but remains in the hospital secondary to lack of safe discharge plan.   Medications reviewed and include lovenox , lexapro , mucinex , MVI, and protonix .   Labs reviewed: CBGS: 134 (inpatient orders for glycemic control are none).    Diet Order:   Diet Order             Diet - low sodium heart healthy           Diet 2 gram sodium Fluid consistency: Thin  Diet effective now                   EDUCATION NEEDS:   Education needs have been addressed  Skin:  Skin Assessment: Reviewed RN Assessment  Last BM:  11/03/24  Height:   Ht Readings from Last 1 Encounters:  10/20/24 5' 4 (1.626 m)    Weight:   Wt Readings from Last 1 Encounters:  11/04/24 52.8 kg    Ideal Body Weight:  59.1 kg  BMI:  Body mass index is  19.98 kg/m.  Estimated Nutritional Needs:   Kcal:  1950-2150  Protein:  105-120 grams  Fluid:  1.9-2.1 L    Margery ORN, RD, LDN, CDCES Registered Dietitian III Certified Diabetes Care and Education Specialist If unable to reach this RD, please use RD Inpatient group chat on secure chat between hours of 8am-4 pm daily

## 2024-11-04 NOTE — Progress Notes (Signed)
 PULMONOLOGY         Date: 11/04/2024,   MRN# [id number] Corey House. [birth year]     AdmissionWeight: 58.8 kg                 CurrentWeight: 52.8 kg  Referring provider: Dr Josette   CHIEF COMPLAINT:   Acute exacerbation of CHF and COPD   HISTORY OF PRESENT ILLNESS   This is a 58 yo M with hx of chronic cough, COPD, CHF, Urolithiasis, tobacco dependence who came in due to worsening SOB.  [name] hypoxemic requiring HHFNC at 15L/min to reach normoxia.  Since admission he has partially improved with weaning down of O2 req.  He had CT chest with PE protocol and was found to have chronic bronchitic COPD and centrilobular emphysema with no venous thromboemoblism.  He also had noted RV dilation suggestive of RV failure.  Cardiac biomarkers were mildy elevated consisted with acute on chronic CHF. He had septic workup performed and had steroids with antibiotics prescribed. He also had DVT studies and this was negative for bilateral LE dvt.  Patient requested pulmonary evaluation and for this reason consultation was placed.   10/22/24- patient is improved, CXR repeating today. Preparing for dc home in AM. Plan to wean steroids and abx to PO regimen on outpatient basis.   10/23/24- patient is able to ambulate around hallway.  He did have very transient mild hypoxemia. He is improved quite a bit. We discussed smoking cessation.  He is concerned about his homelessness. His blood work is improved and blood gas is with chronic hypercapnic respiratory failure.   10/24/24- patient is resting in bed comfortably, reports expectoration of mucus this am and feels slightly better.  He was able to walk around hallway today. CXR is clear.  Plan to wean off oxygen and dc home when possible.  He's on zithromax  250 PO and prednisone  taper. He should use IS and flutter valve.   10/25/24- patient continues to desaturate while ambulating.  He drops his oxygen down to low 80s with slow  walking. Difficult situation with homelessness and needing oxygen therapy and possible BIPAP  10/26/24- patient reports feeling week and dyspneic in bed. He is working on obtaining placement.  He is with advanced COPD.   10/27/24- patient laying in bed in NAD.  His bloodwork is stable. He reports dyspnea at rest. He will need oxygen upon dc it seems due to recurrent hypoxemia.  10/28/24 - patient is laying in bed in do distress. He is on 3L/min [place].  He reports dyspnea at rest but does not appear to be using accessory muscles of respirations and is not tachypneic on examination. His lung auscultation is clear bilaterally.   10/29/24- patient reports anxiety while resting in bed this morning.  He asks for xanax  during my interview.  Ive asked RN to perform exertional ambulation for oxygen requirement on room air this morning.  He appears to be resting in bed in no distress with no adventitious lung sounds on auscultation.  Hopefully can dc out of hospital on ambient air today  10/30/24- patient feels better.  We tried to ambulate on room air but he did have desaturation again. Overall he seems less dyspneic and reports able to ambulate further with less effort.  He has trouble with discharge due to homelessness and O2 needs  10/31/24- Patient states this morning, he wishes to be dcd.  He reports he would be agreeable to  leave without oxygen if he is able.  He has TOC team working with him for disability  coverage.  His hypoxemia is mild on exertion around 88%.  He reports if things get worse he can come back to ER but is agreeable to stay here as well.   11/01/24- patient on room air.  He feels better, has not had PT/OT today to check on oxygen desaturation.  He is working on disability to help with coverage and housing. He is on prednisone  5mg  and zithromax  po 250.  He's stable from pulmonary perspective   11/02/24- no new events overnight. Patient reports gradual improvement.  Wishing to go home if  able.  Remains mildy hypoxemic on exertion.     11/04/24- patient resting in bed in nAD he is on 2L/min [place].  We discussed smoking cessation and the dangers of smoking while utilizing oxygen therapy.  He is stable for dc home when cleared from [facility]  PAST MEDICAL HISTORY   Past Medical History:  Diagnosis Date   Chronic cough    Urolithiasis    Urolithiasis      SURGICAL HISTORY   Past Surgical History:  Procedure Laterality Date   CATARACT EXTRACTION W/PHACO Left 08/09/2021   Procedure: CATARACT EXTRACTION PHACO AND INTRAOCULAR LENS PLACEMENT (IOC) LEFT .051 00:10.0;  Surgeon: Mittie Gaskin, MD;  Location: [facility];  Service: Ophthalmology;  Laterality: Left;   CATARACT EXTRACTION W/PHACO Right 10/04/2021   Procedure: CATARACT EXTRACTION PHACO AND INTRAOCULAR LENS PLACEMENT (IOC) RIGHT 0.67 00:13.8;  Surgeon: Mittie Gaskin, MD;  Location: [facility];  Service: Ophthalmology;  Laterality: Right;  general anesthesia needs to be last case   HERNIA REPAIR     x2   MANDIBLE FRACTURE SURGERY       FAMILY HISTORY   History reviewed. No pertinent family history.   SOCIAL HISTORY   Social History   Tobacco Use   Smoking status: Every Day    Current packs/day: 1.00    Average packs/day: 1 pack/day for 35.0 years (35.0 ttl pk-yrs)    Types: Cigarettes   Smokeless tobacco: Never   Tobacco comments:    Started smoking age 3  Vaping Use   Vaping status: Never Used  Substance Use Topics   Alcohol use: No   Drug use: Not Currently     MEDICATIONS    Home Medication:    Current Medication:  Current Facility-Administered Medications:    acetaminophen  (TYLENOL ) tablet 650 mg, 650 mg, Oral, Q6H PRN, Josette Ade, MD, 650 mg at 11/03/24 1037   albuterol  (PROVENTIL ) (2.5 MG/3ML) 0.083% nebulizer solution 2.5 mg, 2.5 mg, Nebulization, Q2H PRN, Foust, Katy L, NP, 2.5 mg at 10/23/24 9176   ALPRAZolam (XANAX) tablet 0.25 mg, 0.25 mg, Oral,  TID PRN, Josette Ade, MD, 0.25 mg at 11/03/24 1556   artificial tears ophthalmic solution 1 drop, 1 drop, Both Eyes, PRN, Foust, Katy L, NP   azithromycin  (ZITHROMAX ) tablet 250 mg, 250 mg, Oral, Daily, Hien Perreira, MD, 250 mg at 11/03/24 9078   enoxaparin  (LOVENOX ) injection 40 mg, 40 mg, Subcutaneous, Q24H, Foust, Katy L, NP, 40 mg at 11/03/24 1718   escitalopram  (LEXAPRO ) tablet 10 mg, 10 mg, Oral, Daily, Smith, Annie B, NP, 10 mg at 11/03/24 9078   guaiFENesin  (MUCINEX ) 12 hr tablet 600 mg, 600 mg, Oral, BID, Foust, Katy L, NP, 600 mg at 11/03/24 2052   melatonin tablet 5 mg, 5 mg, Oral, QHS PRN, Foust, Katy L, NP, 5 mg at 10/29/24  2129   multivitamin with minerals tablet 1 tablet, 1 tablet, Oral, Daily, Wieting, Richard, MD, 1 tablet at 11/03/24 9078   nicotine  (NICODERM CQ  - dosed in mg/24 hours) patch 14 mg, 14 mg, Transdermal, Daily, Foust, Katy L, NP, 14 mg at 11/03/24 9077   nicotine  polacrilex (NICORETTE ) gum 2 mg, 2 mg, Oral, PRN, Foust, Katy L, NP   Oral care mouth rinse, 15 mL, Mouth Rinse, PRN, Wieting, Richard, MD   pantoprazole  (PROTONIX ) EC tablet 40 mg, 40 mg, Oral, Daily, Foust, Katy L, NP, 40 mg at 11/03/24 0920   QUEtiapine  (SEROQUEL ) tablet 25 mg, 25 mg, Oral, QHS, Smith, Annie B, NP, 25 mg at 11/03/24 2052    ALLERGIES   Patient has no known allergies.     REVIEW OF SYSTEMS    Review of Systems:  Gen:  Denies  fever, sweats, chills weigh loss  HEENT: Denies blurred vision, double vision, ear pain, eye pain, hearing loss, nose bleeds, sore throat Cardiac:  No dizziness, chest pain or heaviness, chest tightness,edema Resp:   reports dyspnea chronically  Gi: Denies swallowing difficulty, stomach pain, nausea or vomiting, diarrhea, constipation, bowel incontinence Gu:  Denies bladder incontinence, burning urine Ext:   Denies Joint pain, stiffness or swelling Skin: Denies  skin rash, easy bruising or bleeding or hives Endoc:  Denies polyuria, polydipsia  , polyphagia or weight change Psych:   Denies depression, insomnia or hallucinations   Other:  All other systems negative   VS: BP 112/78 (BP Location: Left Arm)   Pulse 68   Temp 97.7 F (36.5 C) (Oral)   Resp 18   Ht 5' 4 (1.626 m)   Wt 52.8 kg   SpO2 100%   BMI 19.98 kg/m      PHYSICAL EXAM    GENERAL:NAD, no fevers, chills, no weakness no fatigue HEAD: Normocephalic, atraumatic.  EYES: Pupils equal, round, reactive to light. Extraocular muscles intact. No scleral icterus.  MOUTH: Moist mucosal membrane. Dentition intact. No abscess noted.  EAR, NOSE, THROAT: Clear without exudates. No external lesions.  NECK: Supple. No thyromegaly. No nodules. No JVD.  PULMONARY: decreased breath sounds clear to auscultation CARDIOVASCULAR: S1 and S2. Regular rate and rhythm. No murmurs, rubs, or gallops. No edema. Pedal pulses 2+ bilaterally.  GASTROINTESTINAL: Soft, nontender, nondistended. No masses. Positive bowel sounds. No hepatosplenomegaly.  MUSCULOSKELETAL: No swelling, clubbing, or edema. Range of motion full in all extremities.  NEUROLOGIC: Cranial nerves II through XII are intact. No gross focal neurological deficits. Sensation intact. Reflexes intact.  SKIN: No ulceration, lesions, rashes, or cyanosis. Skin warm and dry. Turgor intact.  PSYCHIATRIC: Mood, affect within normal limits. The patient is awake, alert and oriented x 3. Insight, judgment intact.       IMAGING     rative & Impression  EXAM: CTA of the Chest with contrast for PE 10/20/2024 02:19:01 PM   TECHNIQUE: CTA of the chest was performed without and with the administration of 75 mL of iohexol  (OMNIPAQUE ) 350 MG/ML injection. Multiplanar reformatted images are provided for review. MIP images are provided for review. Automated exposure control, iterative reconstruction, and/or weight based adjustment of the mA/kV was utilized to reduce the radiation dose to as low as reasonably achievable.    COMPARISON: Same day x-ray and CTA chest 01/04/2023.   CLINICAL HISTORY: Pulmonary embolism (PE) suspected, high prob.   FINDINGS:   PULMONARY ARTERIES: Pulmonary arteries are adequately opacified for evaluation. Negative for pulmonary embolism. Main pulmonary artery is normal in  caliber.   MEDIASTINUM: Dilated right ventricle with reflux of contrast into the IVC and hepatic veins compatible with elevated right heart pressures. There is no acute abnormality of the thoracic aorta.   LYMPH NODES: No mediastinal, hilar or axillary lymphadenopathy.   LUNGS AND PLEURA: Diffuse bronchial wall thickening. Mucous plugging in the lower lobes. Emphysema. No focal consolidation or pulmonary edema. No pleural effusion or pneumothorax.   UPPER ABDOMEN: Limited images of the upper abdomen are unremarkable.   SOFT TISSUES AND BONES: No acute bone or soft tissue abnormality.   IMPRESSION: 1. No pulmonary embolism. 2. Dilated right ventricle with reflux of contrast into the IVC and hepatic veins, compatible with elevated right heart pressures. 3. Chronic bronchitis. 4. Emphysema. Given patient age, consider evaluation for low-dose CT lung cancer screening.   Electronically signed by: Norman Gatlin MD 10/20/2024 02:49 PM EST RP Workstation: [id number]      ASSESSMENT/PLAN     Acute exacerbation of COPD- RESOLVED   - continue nebulizer therapy Duoneb 16h prn   - s/p solumedrol 125x1 followed by 40mg  IV daily -wean to pred  -adding zithromax  PO - PT/OT -Incentive spirometry and flutter valve    Mucus plugging of tracheobronchial tree-RESOLVED     - considering outpatient bronchoscopy    Tobacco dependence   - 14mg  transdermal patch     Enlarged Right ventricle Suggestive of pulmonary hypertension - would recommend RHC via cardiology service when possible non urgently    Psychiatric history   Patient is currently being evaluated by psychiatrist     Chronic  hypoxemia    -  Mild hypoxemia noted on exertional walk.  TOC working on disability for insurance coverage.  He does have advanced emphysema with hypoxemia and homelesness.            Thank you for allowing me to participate in the care of this patient.   Patient/Family are satisfied with care plan and all questions have been answered.    Provider disclosure: Patient with at least one acute or chronic illness or injury that poses a threat to life or bodily function and is being managed actively during this encounter.  All of the below services have been performed independently by signing provider:  review of prior documentation from internal and or external health records.  Review of previous and current lab results.  Interview and comprehensive assessment during patient visit today. Review of current and previous chest radiographs/CT scans. Discussion of management and test interpretation with health care team and patient/family.   This document was prepared using Dragon voice recognition software and may include unintentional dictation errors.     Krimson Massmann, M.D.  Division of Pulmonary & Critical Care Medicine

## 2024-11-05 DIAGNOSIS — J9601 Acute respiratory failure with hypoxia: Secondary | ICD-10-CM | POA: Diagnosis not present

## 2024-11-05 DIAGNOSIS — J9602 Acute respiratory failure with hypercapnia: Secondary | ICD-10-CM | POA: Diagnosis not present

## 2024-11-05 NOTE — Plan of Care (Signed)
  Problem: Activity: Goal: Risk for activity intolerance will decrease Outcome: Progressing   Problem: Nutrition: Goal: Adequate nutrition will be maintained Outcome: Progressing   Problem: Coping: Goal: Level of anxiety will decrease Outcome: Progressing   Problem: Elimination: Goal: Will not experience complications related to bowel motility Outcome: Progressing Goal: Will not experience complications related to urinary retention Outcome: Progressing   Problem: Pain Managment: Goal: General experience of comfort will improve and/or be controlled Outcome: Progressing   Problem: Safety: Goal: Ability to remain free from injury will improve Outcome: Progressing   Problem: Skin Integrity: Goal: Risk for impaired skin integrity will decrease Outcome: Progressing   Problem: Respiratory: Goal: Ability to maintain a clear airway will improve Outcome: Progressing   Problem: Respiratory: Goal: Levels of oxygenation will improve Outcome: Not Progressing  Pt qualified for home oxygen this shift

## 2024-11-05 NOTE — TOC Progression Note (Addendum)
 Transition of Care (TOC) - Progression Note    Patient Details  Name: Corey House. MRN: [id number] Date of Birth: 1966-03-24  [facility] CM/SW Contact  Lauraine JAYSON Carpen, LCSW Phone Number: 11/05/2024, 9:03 AM  Clinical Narrative:   Left voicemail for [facility] case manager to check status of group home placement.  12:35 pm: Reviewed case in ELOS meeting.  Expected Discharge Plan:  (patient is currently homeless, unsure of discharge location at this time) Barriers to Discharge: Continued Medical Work up, Other (must enter comment) (homeless)               Expected Discharge Plan and Services   Discharge Planning Services: CM Consult   Living arrangements for the past 2 months: Homeless Expected Discharge Date: 11/03/24                                     Social Drivers of Health (SDOH) Interventions SDOH Screenings   Food Insecurity: Food Insecurity Present (10/20/2024)  Housing: High Risk (10/21/2024)  Transportation Needs: Unmet Transportation Needs (10/20/2024)  Utilities: At Risk (10/20/2024)  Alcohol  Screen: Low Risk  (04/23/2024)  Social Connections: Unknown (04/23/2024)  Tobacco Use: High Risk (10/21/2024)    Readmission Risk Interventions     No data to display

## 2024-11-05 NOTE — Progress Notes (Signed)
SATURATION QUALIFICATIONS: (This note is used to comply with regulatory documentation for home oxygen)  Patient Saturations on Room Air at Rest = 90%  Patient Saturations on Room Air while Ambulating = 84%  Patient Saturations on 2 Liters of oxygen while Ambulating = 90%  Please briefly explain why patient needs home oxygen: 

## 2024-11-05 NOTE — Plan of Care (Signed)

## 2024-11-05 NOTE — Progress Notes (Signed)
 Progress Note    Corey House.  FMW:[id number] DOB: 10/17/1966  DOA: 10/20/2024 PCP: Patient, [name]      Brief Narrative:    Medical records reviewed and are as summarized below:  Corey House. is a 58 y.o. male with medical history significant for tobacco use disorder (smokes a pack of cigarettes a day since he was 58 years old) has not seen a medical provider in about 20 years.  He presented to the hospital with shortness of breath that started about 9 months prior to admission.  However, shortness of breath has progressively worsened in the 3 months preceding admission.  He also complained of chronic cough but reported that cough had become productive of whitish sputum.  He reported bilateral lower extremity swelling that he noticed about 2 days prior to admission.  He also complained of easy fatigability and shortness of breath with minimal activity.  ED Course: On arrival to [facility] ED patient was noted to be afebrile temp 36.8C, BP 142/111, HR 88, RR 22, SPO2 100% on 15L non-rebreather. Patient now on 2L via nasal cannula with SPO2 96%.    CTA chest IMPRESSION: 1. No pulmonary embolism. 2. Dilated right ventricle with reflux of contrast into the IVC and hepatic veins, compatible with elevated right heart pressures. 3. Chronic bronchitis. 4. Emphysema. Given patient age, consider evaluation for low-dose CT lung cancer screening.   He was admitted to the hospital for COPD and CHF exacerbation.   Assessment/Plan:   Principal Problem:   Acute respiratory failure with hypoxia and hypercapnia (HCC) Active Problems:   COPD with acute exacerbation (HCC)   Acute on chronic diastolic CHF (congestive heart failure) (HCC)   Tobacco use disorder   Headache   Increased anion gap metabolic acidosis   Hypernatremia   Protein-calorie malnutrition, severe   Depression   Nutrition Problem: Severe Malnutrition Etiology: social / environmental  circumstances  Signs/Symptoms: moderate fat depletion, severe fat depletion, moderate muscle depletion, severe muscle depletion, edema   Body mass index is 19.9 kg/m.   Acute on chronic hypoxic and hypercapnic respiratory failure: Oxygen requirement down to 2 L/min via [place].  He is unable to tolerate BiPAP. Oxygen saturation on room air at rest was 90%, oxygen saturation on room air while ambulating was 84% and oxygen saturation on 2 L of oxygen while ambulating was 90%.  He will need home oxygen.   COPD exacerbation: Improved.  Completed course of steroids.  Continue bronchodilators and azithromycin .   Acute on chronic diastolic CHF: Improved.  S/p treatment with IV Lasix. 2D echo showed EF estimated at 55 to 60%, normal LV diastolic parameters, mildly reduced RV systolic function, mildly enlarged RV size, moderately increased RV wall thickness, mildly elevated pulmonary artery systolic pressure.   Headache: Improved   Hypernatremia: Improved   Comorbidities include tobacco use disorder, depression, severe protein caloric malnutrition   Homelessness: Patient is homeless and unable to discharge to a shelter with oxygen.  Follow-up with TOC to assist with disposition.   Diet Order             Diet - low sodium heart healthy           Diet 2 gram sodium Fluid consistency: Thin  Diet effective now                                  Consultants:  Pulmonologist  Procedures: None    Medications:    azithromycin   250 mg Oral Daily   enoxaparin  (LOVENOX ) injection  40 mg Subcutaneous Q24H   escitalopram   10 mg Oral Daily   guaiFENesin   600 mg Oral BID   multivitamin with minerals  1 tablet Oral Daily   nicotine   14 mg Transdermal Daily   pantoprazole  40 mg Oral Daily   QUEtiapine   25 mg Oral QHS   Continuous Infusions:   Anti-infectives (From admission, onward)    Start     Dose/Rate Route Frequency Ordered Stop   10/21/24 1445  azithromycin   (ZITHROMAX ) tablet 250 mg        250 mg Oral Daily 10/21/24 1351                Family Communication/Anticipated D/C date and plan/Code Status   DVT prophylaxis: enoxaparin  (LOVENOX ) injection 40 mg Start: 10/20/24 1800     Code Status: Full Code  Family Communication: None Disposition Plan: To be determined   Status is: Inpatient Remains inpatient appropriate because: Unable to wean off oxygen       Subjective:   No acute events reported.  No shortness of breath or chest pain.  He feels okay.  Objective:    Vitals:   11/04/24 1951 11/05/24 0401 11/05/24 0450 11/05/24 0756  BP: 118/81 103/71  108/75  Pulse: 87 75  68  Resp:    20  Temp: 97.6 F (36.4 C) 97.8 F (36.6 C)  97.6 F (36.4 C)  TempSrc: Oral Oral  Oral  SpO2: 98% 97%  99%  Weight:   52.6 kg   Height:       No data found.   Intake/Output Summary (Last 24 hours) at 11/05/2024 1129 Last data filed at 11/05/2024 1009 Gross per 24 hour  Intake 720 ml  Output 850 ml  Net -130 ml   Filed Weights   11/03/24 0500 11/04/24 0317 11/05/24 0450  Weight: 53 kg 52.8 kg 52.6 kg    Exam:   GEN: NAD SKIN: Warm and dry EYES: Anicteric ENT: MMM CV: RRR PULM: CTA B ABD: soft, ND, NT, +BS CNS: AAO x 3, non focal EXT: No edema or tenderness       Data Reviewed:   I have personally reviewed following labs and imaging studies:  Labs: Labs show the following:   Basic Metabolic Panel: Recent Labs  Lab 11/01/24 0517 11/03/24 0751 11/04/24 0422  NA 144 138 138  K 4.6 4.5 4.9  CL 99 98 96*  CO2 36* 35* 35*  GLUCOSE 88 87 87  BUN 20 19 19   CREATININE 0.71 0.54* 0.79  CALCIUM 9.1 8.6* 9.1   GFR Estimated Creatinine Clearance: 76.7 mL/min (by C-G formula based on SCr of 0.79 mg/dL). Liver Function Tests: No results for input(s): AST, ALT, ALKPHOS, BILITOT, PROT, ALBUMIN in the last 168 hours. No results for input(s): LIPASE, AMYLASE in the last 168 hours. No  results for input(s): AMMONIA in the last 168 hours. Coagulation profile No results for input(s): INR, PROTIME in the last 168 hours.  CBC: No results for input(s): WBC, NEUTROABS, HGB, HCT, MCV, PLT in the last 168 hours. Cardiac Enzymes: No results for input(s): CKTOTAL, CKMB, CKMBINDEX, TROPONINI in the last 168 hours. BNP (last 3 results) Recent Labs    10/29/24 0608  PROBNP <50.0   CBG: Recent Labs  Lab 11/01/24 0906  GLUCAP 134*   D-Dimer: No results for input(s): DDIMER in the  last 72 hours. Hgb A1c: No results for input(s): HGBA1C in the last 72 hours. Lipid Profile: No results for input(s): CHOL, HDL, LDLCALC, TRIG, CHOLHDL, LDLDIRECT in the last 72 hours. Thyroid function studies: No results for input(s): TSH, T4TOTAL, T3FREE, THYROIDAB in the last 72 hours.  Invalid input(s): FREET3 Anemia work up: No results for input(s): VITAMINB12, FOLATE, FERRITIN, TIBC, IRON, RETICCTPCT in the last 72 hours. Sepsis Labs: No results for input(s): PROCALCITON, WBC, LATICACIDVEN in the last 168 hours.  Microbiology No results found for this or any previous visit (from the past 240 hours).  Procedures and diagnostic studies:  No results found.             LOS: 16 days   Janie Capp  [employer]er on [email or web address]. If 7PM-7AM, please contact night-coverage at [email or web address]     11/05/2024, 11:29 AM

## 2024-11-06 DIAGNOSIS — J9601 Acute respiratory failure with hypoxia: Secondary | ICD-10-CM | POA: Diagnosis not present

## 2024-11-06 DIAGNOSIS — J9602 Acute respiratory failure with hypercapnia: Secondary | ICD-10-CM | POA: Diagnosis not present

## 2024-11-06 NOTE — [place] FL2 (Signed)
   MEDICAID [id number] LEVEL OF CARE FORM     IDENTIFICATION  Patient Name: Corey House. Birthdate: 1966/11/02 Sex: male Admission Date (Current Location): 10/20/2024  [facility] and [place] Number:  Chiropodist and Address:  [facility], [address], [place], [place] [address]      Provider Number: [id number]  Attending Physician Name and Address:  Jens Durand, MD  Relative Name and Phone Number:  NOne    Current Level of Care: Hospital Recommended Level of Care: Assisted Living Facility, Other (Comment) (Group Home) Prior Approval Number:    Date Approved/Denied:   PASRR Number: Pending  Discharge Plan: Other (Comment) (Group home)    Current Diagnoses: Patient Active Problem List   Diagnosis Date Noted   Acute on chronic diastolic CHF (congestive heart failure) (HCC) 10/21/2024   Headache 10/21/2024   Increased anion gap metabolic acidosis 10/21/2024   Hypernatremia 10/21/2024   Acute respiratory failure with hypoxia and hypercapnia (HCC) 10/20/2024   Anxiety state 04/23/2024   MDD (major depressive disorder), recurrent episode, severe (HCC) 04/23/2024   Buprenorphine  and naloxone  withdrawal (HCC) 12/31/2023   Depression 12/28/2023   Benzodiazepine abuse (HCC) 12/28/2023   Suicidal ideations 12/28/2023   Major depressive disorder, recurrent episode, moderate (HCC) 01/04/2023   Pressure injury of skin 01/04/2023   Protein-calorie malnutrition, severe 01/04/2023   COPD with acute exacerbation (HCC) 01/03/2023   Tobacco use disorder 01/03/2023   Acute respiratory failure with hypoxia (HCC) 01/03/2023   Cough 01/03/2023    Orientation RESPIRATION BLADDER Height & Weight     Self, Time, Situation, Place  O2 Continent Weight: 54.4 kg Height:  5' 4 (162.6 cm)  BEHAVIORAL SYMPTOMS/MOOD NEUROLOGICAL BOWEL NUTRITION STATUS      Continent Diet  AMBULATORY STATUS COMMUNICATION OF NEEDS Skin   Independent Verbally  Normal                       Personal Care Assistance Level of Assistance  Bathing, Feeding, Dressing Bathing Assistance: Independent Feeding assistance: Independent Dressing Assistance: Independent     Functional Limitations Info             SPECIAL CARE FACTORS FREQUENCY                       Contractures Contractures Info: Not present    Additional Factors Info  Code Status, Allergies Code Status Info: Full Allergies Info: No Known allergies           Current Medications (11/06/2024):  This is the current hospital active medication list Current Facility-Administered Medications  Medication Dose Route Frequency Provider Last Rate Last Admin   acetaminophen  (TYLENOL ) tablet 650 mg  650 mg Oral Q6H PRN Josette Ade, MD   650 mg at 11/06/24 1020   albuterol  (PROVENTIL ) (2.5 MG/3ML) 0.083% nebulizer solution 2.5 mg  2.5 mg Nebulization Q2H PRN Foust, Katy L, NP   2.5 mg at 10/23/24 9176   ALPRAZolam  (XANAX ) tablet 0.25 mg  0.25 mg Oral TID PRN Josette Ade, MD   0.25 mg at 11/06/24 9162   artificial tears ophthalmic solution 1 drop  1 drop Both Eyes PRN Foust, Katy L, NP       azithromycin  (ZITHROMAX ) tablet 250 mg  250 mg Oral Daily Aleskerov, Fuad, MD   250 mg at 11/06/24 0837   enoxaparin  (LOVENOX ) injection 40 mg  40 mg Subcutaneous Q24H Foust, Katy L, NP   40 mg at  11/05/24 1830   escitalopram  (LEXAPRO ) tablet 10 mg  10 mg Oral Daily Smith, Annie B, NP   10 mg at 11/06/24 9162   guaiFENesin  (MUCINEX ) 12 hr tablet 600 mg  600 mg Oral BID Foust, Katy L, NP   600 mg at 11/06/24 9162   melatonin tablet 5 mg  5 mg Oral QHS PRN Foust, Katy L, NP   5 mg at 11/05/24 2118   multivitamin with minerals tablet 1 tablet  1 tablet Oral Daily Josette Ade, MD   1 tablet at 11/06/24 9162   nicotine  (NICODERM CQ  - dosed in mg/24 hours) patch 14 mg  14 mg Transdermal Daily Foust, Katy L, NP   14 mg at 11/06/24 9161   nicotine  polacrilex (NICORETTE ) gum 2 mg  2 mg  Oral PRN Foust, Katy L, NP       Oral care mouth rinse  15 mL Mouth Rinse PRN Wieting, Richard, MD       pantoprazole  (PROTONIX ) EC tablet 40 mg  40 mg Oral Daily Foust, Katy L, NP   40 mg at 11/06/24 0837   QUEtiapine  (SEROQUEL ) tablet 25 mg  25 mg Oral QHS Smith, Annie B, NP   25 mg at 11/05/24 2117     Discharge Medications: Please see discharge summary for a list of discharge medications.  Relevant Imaging Results:  Relevant Lab Results:   Additional Information SSN [id number]  Delphine KANDICE Bring, RN

## 2024-11-06 NOTE — TOC Progression Note (Addendum)
 Transition of Care (TOC) - Progression Note    Patient Details  Name: Corey House. MRN: [id number] Date of Birth: 1966-04-26  Transition of [facility]) CM/SW Contact  Delphine KANDICE Bring, RN Phone Number: 11/06/2024, 3:59 PM  Clinical Narrative:    Cm spoke with Transition Care Manager of [facility], Zelvia at  [phone]. CM asked about the status of his placement. She states that patient has no ID, no address, and no income. She states patient does not have an address to get his ID or the funding. CM asked to use the hospital address and if they had funds to assist with his  ID. She said they do not have funding to get his ID.  She states that his Case worker Alexandria([phone] ext [phone]) came Monday and completed her assessment. She states that someone from [facility]  had assisted him in completing the application for his disability. CM asked about a respite home that patient can go to while waiting on his disability. She stated no.  Cm asked Zelvia  to have Alexandria call CM .   1556: CM received a call from Zelvia. She states that she has found voteriders.org website to assist with getting his ID. CM spoke with patient via phone. CM explained that Cm will be giving him  a number that he need to call to assist with his ID. Patient states that he does not have a SS card. CM asked where was he born and was his ID current? Patient states that he was born in [place] , [place] and his drivers license expired last year. He did not renewed because he did not have a car. CM provided him the number to call to help with his ID.  Cm called Alexandria but had to leave a message.   1625  Cm received a call from Alexandria.  She states that [name] [phone], is the person who assisted patient with disability application. Adelita states that they have been looking for group home that Medicaid will pay for under 5600A.  They haven't found an accepting home at this time. She states  that due to his no income and oxygen they have not been able find a shelter for patient to go too.    Expected Discharge Plan:  (patient is currently homeless, unsure of discharge location at this time) Barriers to Discharge: Continued Medical Work up, Other (must enter comment) (homeless)               Expected Discharge Plan and Services   Discharge Planning Services: CM Consult   Living arrangements for the past 2 months: Homeless Expected Discharge Date: 11/03/24                                     Social Drivers of Health (SDOH) Interventions SDOH Screenings   Food Insecurity: Food Insecurity Present (10/20/2024)  Housing: High Risk (10/21/2024)  Transportation Needs: Unmet Transportation Needs (10/20/2024)  Utilities: At Risk (10/20/2024)  Alcohol  Screen: Low Risk  (04/23/2024)  Social Connections: Unknown (04/23/2024)  Tobacco Use: High Risk (10/21/2024)    Readmission Risk Interventions     No data to display

## 2024-11-06 NOTE — Plan of Care (Signed)

## 2024-11-06 NOTE — Progress Notes (Signed)
 Progress Note    Corey House.  FMW:[id number] DOB: 07/11/1966  DOA: 10/20/2024 PCP: Patient, [name]      Brief Narrative:    Medical records reviewed and are as summarized below:  Corey House Corey House. is a 58 y.o. male with medical history significant for tobacco use disorder (smokes a pack of cigarettes a day since he was 58 years old) has not seen a medical provider in about 20 years.  He presented to the hospital with shortness of breath that started about 9 months prior to admission.  However, shortness of breath has progressively worsened in the 3 months preceding admission.  He also complained of chronic cough but reported that cough had become productive of whitish sputum.  He reported bilateral lower extremity swelling that he noticed about 2 days prior to admission.  He also complained of easy fatigability and shortness of breath with minimal activity.  ED Course: On arrival to [facility] ED patient was noted to be afebrile temp 36.8C, BP 142/111, HR 88, RR 22, SPO2 100% on 15L non-rebreather. Patient now on 2L via nasal cannula with SPO2 96%.    CTA chest IMPRESSION: 1. No pulmonary embolism. 2. Dilated right ventricle with reflux of contrast into the IVC and hepatic veins, compatible with elevated right heart pressures. 3. Chronic bronchitis. 4. Emphysema. Given patient age, consider evaluation for low-dose CT lung cancer screening.   He was admitted to the hospital for COPD and CHF exacerbation.   Assessment/Plan:   Principal Problem:   Acute respiratory failure with hypoxia and hypercapnia (HCC) Active Problems:   COPD with acute exacerbation (HCC)   Acute on chronic diastolic CHF (congestive heart failure) (HCC)   Tobacco use disorder   Headache   Increased anion gap metabolic acidosis   Hypernatremia   Protein-calorie malnutrition, severe   Depression   Nutrition Problem: Severe Malnutrition Etiology: social / environmental  circumstances  Signs/Symptoms: moderate fat depletion, severe fat depletion, moderate muscle depletion, severe muscle depletion, edema   Body mass index is 20.59 kg/m.   Acute on chronic hypoxic and hypercapnic respiratory failure: Oxygen requirement down to 2 L/min via [place].  He is unable to tolerate BiPAP. Oxygen saturation dropped to 84% with ambulation.  He will need home oxygen.   COPD exacerbation: Improved.  Completed course of steroids.  Continue azithromycin  and bronchodilators.     Acute on chronic diastolic CHF: Improved.  S/p treatment with IV Lasix . 2D echo showed EF estimated at 55 to 60%, normal LV diastolic parameters, mildly reduced RV systolic function, mildly enlarged RV size, moderately increased RV wall thickness, mildly elevated pulmonary artery systolic pressure.   Headache: Improved   Hypernatremia: Improved   Comorbidities include tobacco use disorder, depression, severe protein caloric malnutrition   Homelessness: Patient is homeless and unable to discharge to a shelter with oxygen.  Follow-up with TOC to assist with disposition.   Diet Order             Diet - low sodium heart healthy           Diet 2 gram sodium Fluid consistency: Thin  Diet effective now                                  Consultants: Pulmonologist  Procedures: None    Medications:    azithromycin   250 mg Oral Daily   enoxaparin  (LOVENOX )  injection  40 mg Subcutaneous Q24H   escitalopram   10 mg Oral Daily   guaiFENesin   600 mg Oral BID   multivitamin with minerals  1 tablet Oral Daily   nicotine   14 mg Transdermal Daily   pantoprazole   40 mg Oral Daily   QUEtiapine   25 mg Oral QHS   Continuous Infusions:   Anti-infectives (From admission, onward)    Start     Dose/Rate Route Frequency Ordered Stop   10/21/24 1445  azithromycin  (ZITHROMAX ) tablet 250 mg        250 mg Oral Daily 10/21/24 1351                Family  Communication/Anticipated D/C date and plan/Code Status   DVT prophylaxis: enoxaparin  (LOVENOX ) injection 40 mg Start: 10/20/24 1800     Code Status: Full Code  Family Communication: None Disposition Plan: To be determined   Status is: Inpatient Remains inpatient appropriate because: Unable to wean off oxygen       Subjective:   Interval events noted.  No chest pain or shortness of breath.  He understands that he will need home oxygen but requested a portable oxygen tank.  Objective:    Vitals:   11/06/24 0508 11/06/24 0813 11/06/24 1303 11/06/24 1525  BP: 106/65 106/83  113/82  Pulse: 78 70  86  Resp:  18    Temp: 97.9 F (36.6 C)  98.1 F (36.7 C) 98.2 F (36.8 C)  TempSrc: Oral  Oral Oral  SpO2: 96% 97%  96%  Weight:      Height:       No data found.   Intake/Output Summary (Last 24 hours) at 11/06/2024 1627 Last data filed at 11/06/2024 1303 Gross per 24 hour  Intake 960 ml  Output 1275 ml  Net -315 ml   Filed Weights   11/04/24 0317 11/05/24 0450 11/06/24 0500  Weight: 52.8 kg 52.6 kg 54.4 kg    Exam:  GEN: NAD SKIN: Warm and dry EYES: Anicteric ENT: MMM CV: RRR PULM: No wheezing or rales ABD: soft, ND, NT, +BS CNS: AAO x 3, non focal EXT: No edema or tenderness       Data Reviewed:   I have personally reviewed following labs and imaging studies:  Labs: Labs show the following:   Basic Metabolic Panel: Recent Labs  Lab 11/01/24 0517 11/03/24 0751 11/04/24 0422  NA 144 138 138  K 4.6 4.5 4.9  CL 99 98 96*  CO2 36* 35* 35*  GLUCOSE 88 87 87  BUN 20 19 19   CREATININE 0.71 0.54* 0.79  CALCIUM 9.1 8.6* 9.1   GFR Estimated Creatinine Clearance: 79.3 mL/min (by C-G formula based on SCr of 0.79 mg/dL). Liver Function Tests: No results for input(s): AST, ALT, ALKPHOS, BILITOT, PROT, ALBUMIN in the last 168 hours. No results for input(s): LIPASE, AMYLASE in the last 168 hours. No results for input(s):  AMMONIA in the last 168 hours. Coagulation profile No results for input(s): INR, PROTIME in the last 168 hours.  CBC: No results for input(s): WBC, NEUTROABS, HGB, HCT, MCV, PLT in the last 168 hours. Cardiac Enzymes: No results for input(s): CKTOTAL, CKMB, CKMBINDEX, TROPONINI in the last 168 hours. BNP (last 3 results) Recent Labs    10/29/24 0608  PROBNP <50.0   CBG: Recent Labs  Lab 11/01/24 0906  GLUCAP 134*   D-Dimer: No results for input(s): DDIMER in the last 72 hours. Hgb A1c: No results for input(s): HGBA1C in  the last 72 hours. Lipid Profile: No results for input(s): CHOL, HDL, LDLCALC, TRIG, CHOLHDL, LDLDIRECT in the last 72 hours. Thyroid function studies: No results for input(s): TSH, T4TOTAL, T3FREE, THYROIDAB in the last 72 hours.  Invalid input(s): FREET3 Anemia work up: No results for input(s): VITAMINB12, FOLATE, FERRITIN, TIBC, IRON, RETICCTPCT in the last 72 hours. Sepsis Labs: No results for input(s): PROCALCITON, WBC, LATICACIDVEN in the last 168 hours.  Microbiology No results found for this or any previous visit (from the past 240 hours).  Procedures and diagnostic studies:  No results found.             LOS: 17 days   Io Dieujuste  [employer]er on [email or web address]. If 7PM-7AM, please contact night-coverage at [email or web address]     11/06/2024, 4:27 PM

## 2024-11-07 DIAGNOSIS — J9602 Acute respiratory failure with hypercapnia: Secondary | ICD-10-CM | POA: Diagnosis not present

## 2024-11-07 DIAGNOSIS — J9601 Acute respiratory failure with hypoxia: Secondary | ICD-10-CM | POA: Diagnosis not present

## 2024-11-07 NOTE — Progress Notes (Addendum)
 Progress Note    Corey House.  FMW:[id number] DOB: October 01, 1966  DOA: 10/20/2024 PCP: Patient, [name]      Brief Narrative:    Medical records reviewed and are as summarized below:  Corey House. is a 58 y.o. male with medical history significant for tobacco use disorder (smokes a pack of cigarettes a day since he was 58 years old) has not seen a medical provider in about 20 years.  He presented to the hospital with shortness of breath that started about 9 months prior to admission.  However, shortness of breath has progressively worsened in the 3 months preceding admission.  He also complained of chronic cough but reported that cough had become productive of whitish sputum.  He reported bilateral lower extremity swelling that he noticed about 2 days prior to admission.  He also complained of easy fatigability and shortness of breath with minimal activity.  ED Course: On arrival to [facility] ED patient was noted to be afebrile temp 36.8C, BP 142/111, HR 88, RR 22, SPO2 100% on 15L non-rebreather. Patient now on 2L via nasal cannula with SPO2 96%.    CTA chest IMPRESSION: 1. No pulmonary embolism. 2. Dilated right ventricle with reflux of contrast into the IVC and hepatic veins, compatible with elevated right heart pressures. 3. Chronic bronchitis. 4. Emphysema. Given patient age, consider evaluation for low-dose CT lung cancer screening.   He was admitted to the hospital for COPD and CHF exacerbation.   Assessment/Plan:   Principal Problem:   Acute respiratory failure with hypoxia and hypercapnia (HCC) Active Problems:   COPD with acute exacerbation (HCC)   Acute on chronic diastolic CHF (congestive heart failure) (HCC)   Tobacco use disorder   Headache   Increased anion gap metabolic acidosis   Hypernatremia   Protein-calorie malnutrition, severe   Depression   Nutrition Problem: Severe Malnutrition Etiology: social / environmental  circumstances  Signs/Symptoms: moderate fat depletion, severe fat depletion, moderate muscle depletion, severe muscle depletion, edema   Body mass index is 20.59 kg/m.   Acute on chronic hypoxic and hypercapnic respiratory failure: Oxygen requirement down to 2 L/min via [place].  He continues to decline BiPAP because of intolerance.   Oxygen saturation dropped to 84% with ambulation.  He will need home oxygen.   COPD exacerbation: Improved.  Completed course of steroids.  Continue bronchodilators and long-term azithromycin .     Acute on chronic diastolic CHF: Improved.  S/p treatment with IV Lasix . 2D echo showed EF estimated at 55 to 60%, normal LV diastolic parameters, mildly reduced RV systolic function, mildly enlarged RV size, moderately increased RV wall thickness, mildly elevated pulmonary artery systolic pressure.   Headache: Improved   Hypernatremia: Improved   Comorbidities include tobacco use disorder, depression, severe protein caloric malnutrition   Homelessness: Patient is homeless and unable to discharge to a shelter with oxygen.  Follow-up with TOC to assist with disposition.   Diet Order             Diet - low sodium heart healthy           Diet 2 gram sodium Fluid consistency: Thin  Diet effective now                                  Consultants: Pulmonologist  Procedures: None    Medications:    azithromycin   250 mg Oral  Daily   enoxaparin  (LOVENOX ) injection  40 mg Subcutaneous Q24H   escitalopram   10 mg Oral Daily   guaiFENesin   600 mg Oral BID   multivitamin with minerals  1 tablet Oral Daily   nicotine   14 mg Transdermal Daily   pantoprazole   40 mg Oral Daily   QUEtiapine   25 mg Oral QHS   Continuous Infusions:   Anti-infectives (From admission, onward)    Start     Dose/Rate Route Frequency Ordered Stop   10/21/24 1445  azithromycin  (ZITHROMAX ) tablet 250 mg        250 mg Oral Daily 10/21/24 1351                 Family Communication/Anticipated D/C date and plan/Code Status   DVT prophylaxis: enoxaparin  (LOVENOX ) injection 40 mg Start: 10/20/24 1800     Code Status: Full Code  Family Communication: None Disposition Plan: To be determined   Status is: Inpatient Remains inpatient appropriate because: Unable to wean off oxygen       Subjective:   No acute issues reported.  He has no complaints.  Objective:    Vitals:   11/06/24 1929 11/07/24 0431 11/07/24 0500 11/07/24 0751  BP: 115/79 108/69  92/74  Pulse: 79 81  79  Resp: 16 18  16   Temp: 97.7 F (36.5 C) 98.1 F (36.7 C)  98.6 F (37 C)  TempSrc: Oral   Oral  SpO2: 94% 96%  98%  Weight:   54.4 kg   Height:       No data found.   Intake/Output Summary (Last 24 hours) at 11/07/2024 1533 Last data filed at 11/07/2024 1300 Gross per 24 hour  Intake 1440 ml  Output 1325 ml  Net 115 ml   Filed Weights   11/05/24 0450 11/06/24 0500 11/07/24 0500  Weight: 52.6 kg 54.4 kg 54.4 kg    Exam:  GEN: NAD SKIN: Warm and dry EYES: No pallor or icterus ENT: MMM CV: RRR PULM: No wheezing or rales heard ABD: soft, ND, NT, +BS CNS: AAO x 3, non focal EXT: No edema or tenderness      Data Reviewed:   I have personally reviewed following labs and imaging studies:  Labs: Labs show the following:   Basic Metabolic Panel: Recent Labs  Lab 11/01/24 0517 11/03/24 0751 11/04/24 0422  NA 144 138 138  K 4.6 4.5 4.9  CL 99 98 96*  CO2 36* 35* 35*  GLUCOSE 88 87 87  BUN 20 19 19   CREATININE 0.71 0.54* 0.79  CALCIUM 9.1 8.6* 9.1   GFR Estimated Creatinine Clearance: 79.3 mL/min (by C-G formula based on SCr of 0.79 mg/dL). Liver Function Tests: No results for input(s): AST, ALT, ALKPHOS, BILITOT, PROT, ALBUMIN in the last 168 hours. No results for input(s): LIPASE, AMYLASE in the last 168 hours. No results for input(s): AMMONIA in the last 168 hours. Coagulation profile No  results for input(s): INR, PROTIME in the last 168 hours.  CBC: No results for input(s): WBC, NEUTROABS, HGB, HCT, MCV, PLT in the last 168 hours. Cardiac Enzymes: No results for input(s): CKTOTAL, CKMB, CKMBINDEX, TROPONINI in the last 168 hours. BNP (last 3 results) Recent Labs    10/29/24 0608  PROBNP <50.0   CBG: Recent Labs  Lab 11/01/24 0906  GLUCAP 134*   D-Dimer: No results for input(s): DDIMER in the last 72 hours. Hgb A1c: No results for input(s): HGBA1C in the last 72 hours. Lipid Profile: No results  for input(s): CHOL, HDL, LDLCALC, TRIG, CHOLHDL, LDLDIRECT in the last 72 hours. Thyroid function studies: No results for input(s): TSH, T4TOTAL, T3FREE, THYROIDAB in the last 72 hours.  Invalid input(s): FREET3 Anemia work up: No results for input(s): VITAMINB12, FOLATE, FERRITIN, TIBC, IRON, RETICCTPCT in the last 72 hours. Sepsis Labs: No results for input(s): PROCALCITON, WBC, LATICACIDVEN in the last 168 hours.  Microbiology No results found for this or any previous visit (from the past 240 hours).  Procedures and diagnostic studies:  No results found.             LOS: 18 days   Shadow Stiggers  [employer] Adjuster on [email or web address]. If 7PM-7AM, please contact night-coverage at [email or web address]     11/07/2024, 3:33 PM

## 2024-11-07 NOTE — Plan of Care (Signed)

## 2024-11-08 DIAGNOSIS — J9602 Acute respiratory failure with hypercapnia: Secondary | ICD-10-CM | POA: Diagnosis not present

## 2024-11-08 DIAGNOSIS — J9601 Acute respiratory failure with hypoxia: Secondary | ICD-10-CM | POA: Diagnosis not present

## 2024-11-08 NOTE — Progress Notes (Signed)
 Progress Note    Corey House.  FMW:[id number] DOB: Jan 06, 1966  DOA: 10/20/2024 PCP: Patient, [name]      Brief Narrative:    Medical records reviewed and are as summarized below:  Corey LELON [name]. is a 58 y.o. male with medical history significant for tobacco use disorder (smokes a pack of cigarettes a day since he was 58 years old) has not seen a medical provider in about 20 years.  He presented to the hospital with shortness of breath that started about 9 months prior to admission.  However, shortness of breath has progressively worsened in the 3 months preceding admission.  He also complained of chronic cough but reported that cough had become productive of whitish sputum.  He reported bilateral lower extremity swelling that he noticed about 2 days prior to admission.  He also complained of easy fatigability and shortness of breath with minimal activity.  ED Course: On arrival to [facility] ED patient was noted to be afebrile temp 36.8C, BP 142/111, HR 88, RR 22, SPO2 100% on 15L non-rebreather. Patient now on 2L via nasal cannula with SPO2 96%.    CTA chest IMPRESSION: 1. No pulmonary embolism. 2. Dilated right ventricle with reflux of contrast into the IVC and hepatic veins, compatible with elevated right heart pressures. 3. Chronic bronchitis. 4. Emphysema. Given patient age, consider evaluation for low-dose CT lung cancer screening.   He was admitted to the hospital for COPD and CHF exacerbation.   Assessment/Plan:   Principal Problem:   Acute respiratory failure with hypoxia and hypercapnia (HCC) Active Problems:   COPD with acute exacerbation (HCC)   Acute on chronic diastolic CHF (congestive heart failure) (HCC)   Tobacco use disorder   Headache   Increased anion gap metabolic acidosis   Hypernatremia   Protein-calorie malnutrition, severe   Depression   Nutrition Problem: Severe Malnutrition Etiology: social / environmental  circumstances  Signs/Symptoms: moderate fat depletion, severe fat depletion, moderate muscle depletion, severe muscle depletion, edema   Body mass index is 20.81 kg/m.   Acute on chronic hypoxic and hypercapnic respiratory failure: Continue 2 L/min oxygen via [place].  He continues to decline BiPAP because of intolerance.   Oxygen saturation dropped to 84% with ambulation.  He will need home oxygen.   COPD exacerbation: Improved.  Continue azithromycin  and bronchodilators.  Completed course of steroids.     Acute on chronic diastolic CHF: Improved.  S/p treatment with IV Lasix . 2D echo showed EF estimated at 55 to 60%, normal LV diastolic parameters, mildly reduced RV systolic function, mildly enlarged RV size, moderately increased RV wall thickness, mildly elevated pulmonary artery systolic pressure.   Headache: Improved   Hypernatremia: Improved   Comorbidities include tobacco use disorder, depression, severe protein caloric malnutrition   Homelessness: Patient is homeless and unable to discharge to a shelter with oxygen.  Follow-up with TOC to assist with disposition.   Diet Order             Diet - low sodium heart healthy           Diet 2 gram sodium Fluid consistency: Thin  Diet effective now                                  Consultants: Pulmonologist  Procedures: None    Medications:    azithromycin   250 mg Oral Daily  enoxaparin  (LOVENOX ) injection  40 mg Subcutaneous Q24H   escitalopram   10 mg Oral Daily   guaiFENesin   600 mg Oral BID   multivitamin with minerals  1 tablet Oral Daily   nicotine   14 mg Transdermal Daily   pantoprazole   40 mg Oral Daily   QUEtiapine   25 mg Oral QHS   Continuous Infusions:   Anti-infectives (From admission, onward)    Start     Dose/Rate Route Frequency Ordered Stop   10/21/24 1445  azithromycin  (ZITHROMAX ) tablet 250 mg        250 mg Oral Daily 10/21/24 1351                Family  Communication/Anticipated D/C date and plan/Code Status   DVT prophylaxis: enoxaparin  (LOVENOX ) injection 40 mg Start: 10/20/24 1800     Code Status: Full Code  Family Communication: None Disposition Plan: To be determined   Status is: Inpatient Remains inpatient appropriate because: Unable to wean off oxygen       Subjective:   No complaints.  No shortness of breath or chest pain.  No acute events reported.  Objective:    Vitals:   11/07/24 1636 11/07/24 2021 11/08/24 0426 11/08/24 0741  BP: 107/83 106/83 105/66 101/80  Pulse: 78 80 74 77  Resp: 16 16 16 14   Temp: 98.3 F (36.8 C) 98.1 F (36.7 C) 98.1 F (36.7 C) 97.7 F (36.5 C)  TempSrc: Oral Oral Oral Oral  SpO2: 94% 93% 94% 94%  Weight:   55 kg   Height:       No data found.   Intake/Output Summary (Last 24 hours) at 11/08/2024 1545 Last data filed at 11/08/2024 1300 Gross per 24 hour  Intake 720 ml  Output 1125 ml  Net -405 ml   Filed Weights   11/06/24 0500 11/07/24 0500 11/08/24 0426  Weight: 54.4 kg 54.4 kg 55 kg    Exam:  GEN: NAD SKIN: Warm and dry EYES: EOMI ENT: MMM CV: RRR PULM: CTA B ABD: soft, ND, NT, +BS CNS: AAO x 3, non focal EXT: No edema or tenderness       Data Reviewed:   I have personally reviewed following labs and imaging studies:  Labs: Labs show the following:   Basic Metabolic Panel: Recent Labs  Lab 11/03/24 0751 11/04/24 0422  NA 138 138  K 4.5 4.9  CL 98 96*  CO2 35* 35*  GLUCOSE 87 87  BUN 19 19  CREATININE 0.54* 0.79  CALCIUM 8.6* 9.1   GFR Estimated Creatinine Clearance: 80.2 mL/min (by C-G formula based on SCr of 0.79 mg/dL). Liver Function Tests: No results for input(s): AST, ALT, ALKPHOS, BILITOT, PROT, ALBUMIN in the last 168 hours. No results for input(s): LIPASE, AMYLASE in the last 168 hours. No results for input(s): AMMONIA in the last 168 hours. Coagulation profile No results for input(s): INR, PROTIME  in the last 168 hours.  CBC: No results for input(s): WBC, NEUTROABS, HGB, HCT, MCV, PLT in the last 168 hours. Cardiac Enzymes: No results for input(s): CKTOTAL, CKMB, CKMBINDEX, TROPONINI in the last 168 hours. BNP (last 3 results) Recent Labs    10/29/24 0608  PROBNP <50.0   CBG: No results for input(s): GLUCAP in the last 168 hours.  D-Dimer: No results for input(s): DDIMER in the last 72 hours. Hgb A1c: No results for input(s): HGBA1C in the last 72 hours. Lipid Profile: No results for input(s): CHOL, HDL, LDLCALC, TRIG, CHOLHDL, LDLDIRECT in  the last 72 hours. Thyroid function studies: No results for input(s): TSH, T4TOTAL, T3FREE, THYROIDAB in the last 72 hours.  Invalid input(s): FREET3 Anemia work up: No results for input(s): VITAMINB12, FOLATE, FERRITIN, TIBC, IRON, RETICCTPCT in the last 72 hours. Sepsis Labs: No results for input(s): PROCALCITON, WBC, LATICACIDVEN in the last 168 hours.  Microbiology No results found for this or any previous visit (from the past 240 hours).  Procedures and diagnostic studies:  No results found.             LOS: 19 days   Winry Egnew  [facility] [employer] [employer] on [email or web address]. If 7PM-7AM, please contact night-coverage at [email or web address]     11/08/2024, 3:45 PM

## 2024-11-08 NOTE — Plan of Care (Signed)

## 2024-11-09 DIAGNOSIS — J9601 Acute respiratory failure with hypoxia: Secondary | ICD-10-CM | POA: Diagnosis not present

## 2024-11-09 DIAGNOSIS — J9602 Acute respiratory failure with hypercapnia: Secondary | ICD-10-CM | POA: Diagnosis not present

## 2024-11-09 NOTE — Progress Notes (Signed)
 Progress Note    Corey House.  FMW:[id number] DOB: May 18, 1966  DOA: 10/20/2024 PCP: Patient, [name]      Brief Narrative:    Medical records reviewed and are as summarized below:  Corey House. is a 58 y.o. male with medical history significant for tobacco use disorder (smokes a pack of cigarettes a day since he was 58 years old) has not seen a medical provider in about 20 years.  He presented to the hospital with shortness of breath that started about 9 months prior to admission.  However, shortness of breath has progressively worsened in the 3 months preceding admission.  He also complained of chronic cough but reported that cough had become productive of whitish sputum.  He reported bilateral lower extremity swelling that he noticed about 2 days prior to admission.  He also complained of easy fatigability and shortness of breath with minimal activity.  ED Course: On arrival to [facility] ED patient was noted to be afebrile temp 36.8C, BP 142/111, HR 88, RR 22, SPO2 100% on 15L non-rebreather. Patient now on 2L via nasal cannula with SPO2 96%.    CTA chest IMPRESSION: 1. No pulmonary embolism. 2. Dilated right ventricle with reflux of contrast into the IVC and hepatic veins, compatible with elevated right heart pressures. 3. Chronic bronchitis. 4. Emphysema. Given patient age, consider evaluation for low-dose CT lung cancer screening.   He was admitted to the hospital for COPD and CHF exacerbation.   Assessment/Plan:   Principal Problem:   Acute respiratory failure with hypoxia and hypercapnia (HCC) Active Problems:   COPD with acute exacerbation (HCC)   Acute on chronic diastolic CHF (congestive heart failure) (HCC)   Tobacco use disorder   Headache   Increased anion gap metabolic acidosis   Hypernatremia   Protein-calorie malnutrition, severe   Depression   Nutrition Problem: Severe Malnutrition Etiology: social / environmental  circumstances  Signs/Symptoms: moderate fat depletion, severe fat depletion, moderate muscle depletion, severe muscle depletion, edema   Body mass index is 21.15 kg/m.   Acute on chronic hypoxic and hypercapnic respiratory failure: Continue 2 L/min oxygen via [place].  He continues to decline BiPAP because of intolerance.   Oxygen saturation dropped to 84% with ambulation.  He will need home oxygen.  Unfortunately, he cannot go to a homeless shelter with oxygen.   COPD exacerbation: Improved.  Continue azithromycin  and bronchodilators.  Completed course of steroids.     Acute on chronic diastolic CHF: Improved.  S/p treatment with IV Lasix . 2D echo showed EF estimated at 55 to 60%, normal LV diastolic parameters, mildly reduced RV systolic function, mildly enlarged RV size, moderately increased RV wall thickness, mildly elevated pulmonary artery systolic pressure.   Headache: Improved   Hypernatremia: Improved   Comorbidities include tobacco use disorder, depression, severe protein caloric malnutrition   Homelessness: Patient is homeless and unable to discharge to a shelter with oxygen.  TOC is working on getting him into a group home.   Diet Order             Diet - low sodium heart healthy           Diet 2 gram sodium Fluid consistency: Thin  Diet effective now                                  Consultants: Pulmonologist  Procedures: None  Medications:    azithromycin   250 mg Oral Daily   enoxaparin  (LOVENOX ) injection  40 mg Subcutaneous Q24H   escitalopram   10 mg Oral Daily   guaiFENesin   600 mg Oral BID   multivitamin with minerals  1 tablet Oral Daily   nicotine   14 mg Transdermal Daily   pantoprazole   40 mg Oral Daily   QUEtiapine   25 mg Oral QHS   Continuous Infusions:   Anti-infectives (From admission, onward)    Start     Dose/Rate Route Frequency Ordered Stop   10/21/24 1445  azithromycin  (ZITHROMAX ) tablet 250 mg         250 mg Oral Daily 10/21/24 1351                Family Communication/Anticipated D/C date and plan/Code Status   DVT prophylaxis: enoxaparin  (LOVENOX ) injection 40 mg Start: 10/20/24 1800     Code Status: Full Code  Family Communication: None Disposition Plan: To be determined   Status is: Inpatient Remains inpatient appropriate because: Unable to wean off oxygen, awaiting placement to group home       Subjective:   Interval events noted.  He has no complaints.  He feels well.  Objective:    Vitals:   11/08/24 1944 11/09/24 0500 11/09/24 0603 11/09/24 0737  BP: 112/78  102/73 113/84  Pulse: 85  76 77  Resp: 16  16 18   Temp: 98.2 F (36.8 C)  97.9 F (36.6 C) 98.6 F (37 C)  TempSrc: Oral  Oral   SpO2: 95%  95% 98%  Weight:  55.9 kg    Height:       No data found.   Intake/Output Summary (Last 24 hours) at 11/09/2024 1356 Last data filed at 11/09/2024 1247 Gross per 24 hour  Intake 476 ml  Output 1450 ml  Net -974 ml   Filed Weights   11/07/24 0500 11/08/24 0426 11/09/24 0500  Weight: 54.4 kg 55 kg 55.9 kg    Exam:  GEN: NAD SKIN: Warm and dry EYES: No pallor or icterus ENT: MMM CV: RRR PULM: No wheezing or rales heard ABD: soft, ND, NT, +BS CNS: AAO x 3, non focal EXT: No edema or tenderness       Data Reviewed:   I have personally reviewed following labs and imaging studies:  Labs: Labs show the following:   Basic Metabolic Panel: Recent Labs  Lab 11/03/24 0751 11/04/24 0422  NA 138 138  K 4.5 4.9  CL 98 96*  CO2 35* 35*  GLUCOSE 87 87  BUN 19 19  CREATININE 0.54* 0.79  CALCIUM 8.6* 9.1   GFR Estimated Creatinine Clearance: 81.5 mL/min (by C-G formula based on SCr of 0.79 mg/dL). Liver Function Tests: No results for input(s): AST, ALT, ALKPHOS, BILITOT, PROT, ALBUMIN in the last 168 hours. No results for input(s): LIPASE, AMYLASE in the last 168 hours. No results for input(s): AMMONIA in the  last 168 hours. Coagulation profile No results for input(s): INR, PROTIME in the last 168 hours.  CBC: No results for input(s): WBC, NEUTROABS, HGB, HCT, MCV, PLT in the last 168 hours. Cardiac Enzymes: No results for input(s): CKTOTAL, CKMB, CKMBINDEX, TROPONINI in the last 168 hours. BNP (last 3 results) Recent Labs    10/29/24 0608  PROBNP <50.0   CBG: No results for input(s): GLUCAP in the last 168 hours.  D-Dimer: No results for input(s): DDIMER in the last 72 hours. Hgb A1c: No results for input(s): HGBA1C in  the last 72 hours. Lipid Profile: No results for input(s): CHOL, HDL, LDLCALC, TRIG, CHOLHDL, LDLDIRECT in the last 72 hours. Thyroid function studies: No results for input(s): TSH, T4TOTAL, T3FREE, THYROIDAB in the last 72 hours.  Invalid input(s): FREET3 Anemia work up: No results for input(s): VITAMINB12, FOLATE, FERRITIN, TIBC, IRON, RETICCTPCT in the last 72 hours. Sepsis Labs: No results for input(s): PROCALCITON, WBC, LATICACIDVEN in the last 168 hours.  Microbiology No results found for this or any previous visit (from the past 240 hours).  Procedures and diagnostic studies:  No results found.             LOS: 20 days   Lousie Calico  [facility]   Pager on [email or web address]. If 7PM-7AM, please contact night-coverage at [email or web address]     11/09/2024, 1:56 PM

## 2024-11-09 NOTE — TOC Progression Note (Signed)
 Transition of Care (TOC) - Progression Note    Patient Details  Name: Corey House. MRN: [id number] Date of Birth: 03-Dec-1966  Transition of Care [facility]) CM/SW Contact  Dalia GORMAN Fuse, RN Phone Number: 11/09/2024, 10:27 AM  Clinical Narrative:     TOC spoke with Zelvia with [facility], there is no update on group home placements for this patient. Zelvia advised they received the FL2 on Friday, October 07, 2023. The barriers to placement are income and valid identification.  TOC to check with vote riders to see if they can assist patient with obtaining an ID. [facility] to assist with application for Medicaid.    Expected Discharge Plan:  (patient is currently homeless, unsure of discharge location at this time) Barriers to Discharge: Financial Resources               Expected Discharge Plan and Services   Discharge Planning Services: CM Consult   Living arrangements for the past 2 months: Homeless Expected Discharge Date: 11/03/24                                     Social Drivers of Health (SDOH) Interventions SDOH Screenings   Food Insecurity: Food Insecurity Present (10/20/2024)  Housing: High Risk (10/21/2024)  Transportation Needs: Unmet Transportation Needs (10/20/2024)  Utilities: At Risk (10/20/2024)  Alcohol  Screen: Low Risk  (04/23/2024)  Social Connections: Unknown (04/23/2024)  Tobacco Use: High Risk (10/21/2024)    Readmission Risk Interventions     No data to display

## 2024-11-10 DIAGNOSIS — J9601 Acute respiratory failure with hypoxia: Secondary | ICD-10-CM | POA: Diagnosis not present

## 2024-11-10 DIAGNOSIS — J9602 Acute respiratory failure with hypercapnia: Secondary | ICD-10-CM | POA: Diagnosis not present

## 2024-11-10 LAB — BASIC METABOLIC PANEL WITH GFR
Anion gap: 7 (ref 5–15)
BUN: 18 mg/dL (ref 6–20)
CO2: 33 mmol/L — ABNORMAL HIGH (ref 22–32)
Calcium: 8.8 mg/dL — ABNORMAL LOW (ref 8.9–10.3)
Chloride: 99 mmol/L (ref 98–111)
Creatinine, Ser: 0.66 mg/dL (ref 0.61–1.24)
GFR, Estimated: >60 mL/min (ref >60)
Glucose, Bld: 99 mg/dL (ref 70–99)
Potassium: 4.3 mmol/L (ref 3.5–5.1)
Sodium: 139 mmol/L (ref 135–145)

## 2024-11-10 LAB — CBC
HCT: 42.2 % (ref 39.0–52.0)
Hemoglobin: 13.6 g/dL (ref 13.0–17.0)
MCH: 30 pg (ref 26.0–34.0)
MCHC: 32.2 g/dL (ref 30.0–36.0)
MCV: 93 fL (ref 80.0–100.0)
Platelets: 267 K/uL (ref 150–400)
RBC: 4.54 MIL/uL (ref 4.22–5.81)
RDW: 13 % (ref 11.5–15.5)
WBC: 4.4 K/uL (ref 4.0–10.5)
nRBC: 0 % (ref 0.0–0.2)

## 2024-11-10 LAB — MAGNESIUM: Magnesium: 2 mg/dL (ref 1.7–2.4)

## 2024-11-10 LAB — PHOSPHORUS: Phosphorus: 4.4 mg/dL (ref 2.5–4.6)

## 2024-11-10 NOTE — Progress Notes (Signed)
 PULMONOLOGY         Date: 11/10/2024,   MRN# [id number] Birney Belshe. [birth year]     AdmissionWeight: 58.8 kg                 CurrentWeight: 54.5 kg  Referring provider: Dr Josette   CHIEF COMPLAINT:   Acute exacerbation of CHF and COPD   HISTORY OF PRESENT ILLNESS   This is a 58 yo M with hx of chronic cough, COPD, CHF, Urolithiasis, tobacco dependence who came in due to worsening SOB.  [name] hypoxemic requiring HHFNC at 15L/min to reach normoxia.  Since admission he has partially improved with weaning down of O2 req.  He had CT chest with PE protocol and was found to have chronic bronchitic COPD and centrilobular emphysema with no venous thromboemoblism.  He also had noted RV dilation suggestive of RV failure.  Cardiac biomarkers were mildy elevated consisted with acute on chronic CHF. He had septic workup performed and had steroids with antibiotics prescribed. He also had DVT studies and this was negative for bilateral LE dvt.  Patient requested pulmonary evaluation and for this reason consultation was placed.   10/22/24- patient is improved, CXR repeating today. Preparing for dc home in AM. Plan to wean steroids and abx to PO regimen on outpatient basis.   10/23/24- patient is able to ambulate around hallway.  He did have very transient mild hypoxemia. He is improved quite a bit. We discussed smoking cessation.  He is concerned about his homelessness. His blood work is improved and blood gas is with chronic hypercapnic respiratory failure.   10/24/24- patient is resting in bed comfortably, reports expectoration of mucus this am and feels slightly better.  He was able to walk around hallway today. CXR is clear.  Plan to wean off oxygen and dc home when possible.  He's on zithromax  250 PO and prednisone  taper. He should use IS and flutter valve.   10/25/24- patient continues to desaturate while ambulating.  He drops his oxygen down to low 80s with slow  walking. Difficult situation with homelessness and needing oxygen therapy and possible BIPAP  10/26/24- patient reports feeling week and dyspneic in bed. He is working on obtaining placement.  He is with advanced COPD.   10/27/24- patient laying in bed in NAD.  His bloodwork is stable. He reports dyspnea at rest. He will need oxygen upon dc it seems due to recurrent hypoxemia.  10/28/24 - patient is laying in bed in do distress. He is on 3L/min [place].  He reports dyspnea at rest but does not appear to be using accessory muscles of respirations and is not tachypneic on examination. His lung auscultation is clear bilaterally.   10/29/24- patient reports anxiety while resting in bed this morning.  He asks for xanax  during my interview.  Ive asked RN to perform exertional ambulation for oxygen requirement on room air this morning.  He appears to be resting in bed in no distress with no adventitious lung sounds on auscultation.  Hopefully can dc out of hospital on ambient air today  10/30/24- patient feels better.  We tried to ambulate on room air but he did have desaturation again. Overall he seems less dyspneic and reports able to ambulate further with less effort.  He has trouble with discharge due to homelessness and O2 needs  10/31/24- Patient states this morning, he wishes to be dcd.  He reports he would be agreeable to  leave without oxygen if he is able.  He has TOC team working with him for disability  coverage.  His hypoxemia is mild on exertion around 88%.  He reports if things get worse he can come back to ER but is agreeable to stay here as well.   11/01/24- patient on room air.  He feels better, has not had PT/OT today to check on oxygen desaturation.  He is working on disability to help with coverage and housing. He is on prednisone  5mg  and zithromax  po 250.  He's stable from pulmonary perspective   11/02/24- no new events overnight. Patient reports gradual improvement.  Wishing to go home if  able.  Remains mildy hypoxemic on exertion.     11/04/24- patient resting in bed in nAD he is on 2L/min [place].  We discussed smoking cessation and the dangers of smoking while utilizing oxygen therapy.  He is stable for dc home when cleared from [facility]   11/05/24- Following patient peripherally for hypoxemia. Still desaturating on room air. Dyspnea present on ly with exertion. Will need O2 upon DC.  I'm available as needed.   PAST MEDICAL HISTORY   Past Medical History:  Diagnosis Date   Chronic cough    Urolithiasis    Urolithiasis      SURGICAL HISTORY   Past Surgical History:  Procedure Laterality Date   CATARACT EXTRACTION W/PHACO Left 08/09/2021   Procedure: CATARACT EXTRACTION PHACO AND INTRAOCULAR LENS PLACEMENT (IOC) LEFT .051 00:10.0;  Surgeon: Mittie Gaskin, MD;  Location: [facility];  Service: Ophthalmology;  Laterality: Left;   CATARACT EXTRACTION W/PHACO Right 10/04/2021   Procedure: CATARACT EXTRACTION PHACO AND INTRAOCULAR LENS PLACEMENT (IOC) RIGHT 0.67 00:13.8;  Surgeon: Mittie Gaskin, MD;  Location: [facility];  Service: Ophthalmology;  Laterality: Right;  general anesthesia needs to be last case   HERNIA REPAIR     x2   MANDIBLE FRACTURE SURGERY       FAMILY HISTORY   History reviewed. No pertinent family history.   SOCIAL HISTORY   Social History   Tobacco Use   Smoking status: Every Day    Current packs/day: 1.00    Average packs/day: 1 pack/day for 35.0 years (35.0 ttl pk-yrs)    Types: Cigarettes   Smokeless tobacco: Never   Tobacco comments:    Started smoking age 27  Vaping Use   Vaping status: Never Used  Substance Use Topics   Alcohol  use: No   Drug use: Not Currently     MEDICATIONS    Home Medication:    Current Medication:  Current Facility-Administered Medications:    acetaminophen  (TYLENOL ) tablet 650 mg, 650 mg, Oral, Q6H PRN, Josette Ade, MD, 650 mg at 11/09/24 1449   albuterol   (PROVENTIL ) (2.5 MG/3ML) 0.083% nebulizer solution 2.5 mg, 2.5 mg, Nebulization, Q2H PRN, Foust, Katy L, NP, 2.5 mg at 10/23/24 9176   ALPRAZolam  (XANAX ) tablet 0.25 mg, 0.25 mg, Oral, TID PRN, Josette Ade, MD, 0.25 mg at 11/10/24 0819   artificial tears ophthalmic solution 1 drop, 1 drop, Both Eyes, PRN, Foust, Katy L, NP   azithromycin  (ZITHROMAX ) tablet 250 mg, 250 mg, Oral, Daily, Marc Sivertsen, MD, 250 mg at 11/10/24 0818   enoxaparin  (LOVENOX ) injection 40 mg, 40 mg, Subcutaneous, Q24H, Foust, Katy L, NP, 40 mg at 11/09/24 1750   escitalopram  (LEXAPRO ) tablet 10 mg, 10 mg, Oral, Daily, Smith, Annie B, NP, 10 mg at 11/10/24 0819   guaiFENesin  (MUCINEX ) 12 hr tablet 600 mg, 600 mg,  Oral, BID, Foust, Katy L, NP, 600 mg at 11/10/24 0819   melatonin tablet 5 mg, 5 mg, Oral, QHS PRN, Foust, Katy L, NP, 5 mg at 11/09/24 2235   multivitamin with minerals tablet 1 tablet, 1 tablet, Oral, Daily, Wieting, Richard, MD, 1 tablet at 11/10/24 9180   nicotine  (NICODERM CQ  - dosed in mg/24 hours) patch 14 mg, 14 mg, Transdermal, Daily, Foust, Katy L, NP, 14 mg at 11/10/24 0820   nicotine  polacrilex (NICORETTE ) gum 2 mg, 2 mg, Oral, PRN, Foust, Katy L, NP   Oral care mouth rinse, 15 mL, Mouth Rinse, PRN, Wieting, Richard, MD   pantoprazole  (PROTONIX ) EC tablet 40 mg, 40 mg, Oral, Daily, Foust, Katy L, NP, 40 mg at 11/10/24 9180   QUEtiapine  (SEROQUEL ) tablet 25 mg, 25 mg, Oral, QHS, Smith, Annie B, NP, 25 mg at 11/09/24 2234    ALLERGIES   Patient has no known allergies.     REVIEW OF SYSTEMS    Review of Systems:  Gen:  Denies  fever, sweats, chills weigh loss  HEENT: Denies blurred vision, double vision, ear pain, eye pain, hearing loss, nose bleeds, sore throat Cardiac:  No dizziness, chest pain or heaviness, chest tightness,edema Resp:   reports dyspnea chronically  Gi: Denies swallowing difficulty, stomach pain, nausea or vomiting, diarrhea, constipation, bowel incontinence Gu:   Denies bladder incontinence, burning urine Ext:   Denies Joint pain, stiffness or swelling Skin: Denies  skin rash, easy bruising or bleeding or hives Endoc:  Denies polyuria, polydipsia , polyphagia or weight change Psych:   Denies depression, insomnia or hallucinations   Other:  All other systems negative   VS: BP 107/81 (BP Location: Left Arm)   Pulse 78   Temp 98.1 F (36.7 C) (Oral)   Resp 20   Ht 5' 4 (1.626 m)   Wt 54.5 kg   SpO2 98%   BMI 20.62 kg/m      PHYSICAL EXAM    GENERAL:NAD, no fevers, chills, no weakness no fatigue HEAD: Normocephalic, atraumatic.  EYES: Pupils equal, round, reactive to light. Extraocular muscles intact. No scleral icterus.  MOUTH: Moist mucosal membrane. Dentition intact. No abscess noted.  EAR, NOSE, THROAT: Clear without exudates. No external lesions.  NECK: Supple. No thyromegaly. No nodules. No JVD.  PULMONARY: decreased breath sounds clear to auscultation CARDIOVASCULAR: S1 and S2. Regular rate and rhythm. No murmurs, rubs, or gallops. No edema. Pedal pulses 2+ bilaterally.  GASTROINTESTINAL: Soft, nontender, nondistended. No masses. Positive bowel sounds. No hepatosplenomegaly.  MUSCULOSKELETAL: No swelling, clubbing, or edema. Range of motion full in all extremities.  NEUROLOGIC: Cranial nerves II through XII are intact. No gross focal neurological deficits. Sensation intact. Reflexes intact.  SKIN: No ulceration, lesions, rashes, or cyanosis. Skin warm and dry. Turgor intact.  PSYCHIATRIC: Mood, affect within normal limits. The patient is awake, alert and oriented x 3. Insight, judgment intact.       IMAGING     rative & Impression  EXAM: CTA of the Chest with contrast for PE 10/20/2024 02:19:01 PM   TECHNIQUE: CTA of the chest was performed without and with the administration of 75 mL of iohexol  (OMNIPAQUE ) 350 MG/ML injection. Multiplanar reformatted images are provided for review. MIP images are provided for review.  Automated exposure control, iterative reconstruction, and/or weight based adjustment of the mA/kV was utilized to reduce the radiation dose to as low as reasonably achievable.   COMPARISON: Same day x-ray and CTA chest 01/04/2023.   CLINICAL HISTORY:  Pulmonary embolism (PE) suspected, high prob.   FINDINGS:   PULMONARY ARTERIES: Pulmonary arteries are adequately opacified for evaluation. Negative for pulmonary embolism. Main pulmonary artery is normal in caliber.   MEDIASTINUM: Dilated right ventricle with reflux of contrast into the IVC and hepatic veins compatible with elevated right heart pressures. There is no acute abnormality of the thoracic aorta.   LYMPH NODES: No mediastinal, hilar or axillary lymphadenopathy.   LUNGS AND PLEURA: Diffuse bronchial wall thickening. Mucous plugging in the lower lobes. Emphysema. No focal consolidation or pulmonary edema. No pleural effusion or pneumothorax.   UPPER ABDOMEN: Limited images of the upper abdomen are unremarkable.   SOFT TISSUES AND BONES: No acute bone or soft tissue abnormality.   IMPRESSION: 1. No pulmonary embolism. 2. Dilated right ventricle with reflux of contrast into the IVC and hepatic veins, compatible with elevated right heart pressures. 3. Chronic bronchitis. 4. Emphysema. Given patient age, consider evaluation for low-dose CT lung cancer screening.   Electronically signed by: Norman Gatlin MD 10/20/2024 02:49 PM EST RP Workstation: [id number]      ASSESSMENT/PLAN     Acute exacerbation of COPD- RESOLVED   - continue nebulizer therapy Duoneb 16h prn   - s/p solumedrol 125x1 followed by 40mg  IV daily -wean to pred  -adding zithromax  PO - PT/OT -Incentive spirometry and flutter valve    Mucus plugging of tracheobronchial tree-RESOLVED     - considering outpatient bronchoscopy    Tobacco dependence   - 14mg  transdermal patch     Enlarged Right ventricle Suggestive of pulmonary  hypertension - would recommend RHC via cardiology service when possible non urgently    Psychiatric history   Patient is currently being evaluated by psychiatrist     Chronic hypoxemia    -  Mild hypoxemia noted on exertional walk.  TOC working on disability for insurance coverage.  He does have advanced emphysema with hypoxemia and homelesness.            Thank you for allowing me to participate in the care of this patient.   Patient/Family are satisfied with care plan and all questions have been answered.    Provider disclosure: Patient with at least one acute or chronic illness or injury that poses a threat to life or bodily function and is being managed actively during this encounter.  All of the below services have been performed independently by signing provider:  review of prior documentation from internal and or external health records.  Review of previous and current lab results.  Interview and comprehensive assessment during patient visit today. Review of current and previous chest radiographs/CT scans. Discussion of management and test interpretation with health care team and patient/family.   This document was prepared using Dragon voice recognition software and may include unintentional dictation errors.     Arliss Frisina, M.D.  Division of Pulmonary & Critical Care Medicine

## 2024-11-10 NOTE — Progress Notes (Addendum)
 Progress Note    Corey House.  FMW:[id number] DOB: Apr 02, 1966  DOA: 10/20/2024 PCP: Patient, [name]      Brief Narrative:    Medical records reviewed and are as summarized below:  Corey LELON [name]. is a 58 y.o. male with medical history significant for tobacco use disorder (smokes a pack of cigarettes a day since he was 58 years old) has not seen a medical provider in about 20 years.  He presented to the hospital with shortness of breath that started about 9 months prior to admission.  However, shortness of breath has progressively worsened in the 3 months preceding admission.  He also complained of chronic cough but reported that cough had become productive of whitish sputum.  He reported bilateral lower extremity swelling that he noticed about 2 days prior to admission.  He also complained of easy fatigability and shortness of breath with minimal activity.  ED Course: On arrival to [facility] ED patient was noted to be afebrile temp 36.8C, BP 142/111, HR 88, RR 22, SPO2 100% on 15L non-rebreather. Patient now on 2L via nasal cannula with SPO2 96%.    CTA chest IMPRESSION: 1. No pulmonary embolism. 2. Dilated right ventricle with reflux of contrast into the IVC and hepatic veins, compatible with elevated right heart pressures. 3. Chronic bronchitis. 4. Emphysema. Given patient age, consider evaluation for low-dose CT lung cancer screening.   He was admitted to the hospital for COPD and CHF exacerbation.   Assessment/Plan:   Principal Problem:   Acute respiratory failure with hypoxia and hypercapnia (HCC) Active Problems:   COPD with acute exacerbation (HCC)   Acute on chronic diastolic CHF (congestive heart failure) (HCC)   Tobacco use disorder   Headache   Increased anion gap metabolic acidosis   Hypernatremia   Protein-calorie malnutrition, severe   Depression   Nutrition Problem: Severe Malnutrition Etiology: social / environmental  circumstances  Signs/Symptoms: moderate fat depletion, severe fat depletion, moderate muscle depletion, severe muscle depletion, edema   Body mass index is 20.62 kg/m.   Acute on chronic hypoxic and hypercapnic respiratory failure: Continue 2 L/min oxygen via [place].  He continues to decline BiPAP because of intolerance.   Oxygen saturation dropped to 84% with ambulation.  He will need home oxygen.  Unfortunately, he cannot go to a homeless shelter with oxygen.   COPD exacerbation: Improved.  Continue azithromycin  and bronchodilators.  Completed course of steroids.     Acute on chronic diastolic CHF: Improved.  S/p treatment with IV Lasix . 2D echo showed EF estimated at 55 to 60%, normal LV diastolic parameters, mildly reduced RV systolic function, mildly enlarged RV size, moderately increased RV wall thickness, mildly elevated pulmonary artery systolic pressure.   Headache: Improved   Hypernatremia: Improved   Comorbidities include tobacco use disorder, depression, severe protein caloric malnutrition   Homelessness: Patient is homeless and unable to discharge to a shelter with oxygen.  TOC is working on getting him into a group home.  Case manager requested a letter from me stating that patient is in the hospital to assist with disposition plans.  Letter is available in the chart.   Diet Order             Diet - low sodium heart healthy           Diet 2 gram sodium Fluid consistency: Thin  Diet effective now  Consultants: Pulmonologist  Procedures: None    Medications:    azithromycin   250 mg Oral Daily   enoxaparin  (LOVENOX ) injection  40 mg Subcutaneous Q24H   escitalopram   10 mg Oral Daily   guaiFENesin   600 mg Oral BID   multivitamin with minerals  1 tablet Oral Daily   nicotine   14 mg Transdermal Daily   pantoprazole   40 mg Oral Daily   QUEtiapine   25 mg Oral QHS   Continuous  Infusions:   Anti-infectives (From admission, onward)    Start     Dose/Rate Route Frequency Ordered Stop   10/21/24 1445  azithromycin  (ZITHROMAX ) tablet 250 mg        250 mg Oral Daily 10/21/24 1351                Family Communication/Anticipated D/C date and plan/Code Status   DVT prophylaxis: enoxaparin  (LOVENOX ) injection 40 mg Start: 10/20/24 1800     Code Status: Full Code  Family Communication: None Disposition Plan: To be determined   Status is: Inpatient Remains inpatient appropriate because: Unable to wean off oxygen, awaiting placement to group home       Subjective:   Interval events noted.  No complaints.  No shortness of breath, cough or chest pain  Objective:    Vitals:   11/10/24 0431 11/10/24 0500 11/10/24 0836 11/10/24 1531  BP: 111/84  107/81 110/86  Pulse: 82  78 81  Resp: 15  20 19   Temp: 98 F (36.7 C)  98.1 F (36.7 C) 98.6 F (37 C)  TempSrc: Oral  Oral Oral  SpO2: 95%  98% 95%  Weight:  54.5 kg    Height:       No data found.   Intake/Output Summary (Last 24 hours) at 11/10/2024 1658 Last data filed at 11/10/2024 1300 Gross per 24 hour  Intake 720 ml  Output 500 ml  Net 220 ml   Filed Weights   11/08/24 0426 11/09/24 0500 11/10/24 0500  Weight: 55 kg 55.9 kg 54.5 kg    Exam:  GEN: NAD SKIN: Warm and dry EYES: No pallor or icterus ENT: MMM CV: RRR PULM: CTA B ABD: soft, ND, NT, +BS CNS: AAO x 3, non focal EXT: No edema or tenderness        Data Reviewed:   I have personally reviewed following labs and imaging studies:  Labs: Labs show the following:   Basic Metabolic Panel: Recent Labs  Lab 11/04/24 0422 11/10/24 0400  NA 138 139  K 4.9 4.3  CL 96* 99  CO2 35* 33*  GLUCOSE 87 99  BUN 19 18  CREATININE 0.79 0.66  CALCIUM 9.1 8.8*  MG  --  2.0  PHOS  --  4.4   GFR Estimated Creatinine Clearance: 79.5 mL/min (by C-G formula based on SCr of 0.66 mg/dL). Liver Function Tests: No  results for input(s): AST, ALT, ALKPHOS, BILITOT, PROT, ALBUMIN in the last 168 hours. No results for input(s): LIPASE, AMYLASE in the last 168 hours. No results for input(s): AMMONIA in the last 168 hours. Coagulation profile No results for input(s): INR, PROTIME in the last 168 hours.  CBC: Recent Labs  Lab 11/10/24 0400  WBC 4.4  HGB 13.6  HCT 42.2  MCV 93.0  PLT 267   Cardiac Enzymes: No results for input(s): CKTOTAL, CKMB, CKMBINDEX, TROPONINI in the last 168 hours. BNP (last 3 results) Recent Labs    10/29/24 0608  PROBNP <50.0   CBG: No  results for input(s): GLUCAP in the last 168 hours.  D-Dimer: No results for input(s): DDIMER in the last 72 hours. Hgb A1c: No results for input(s): HGBA1C in the last 72 hours. Lipid Profile: No results for input(s): CHOL, HDL, LDLCALC, TRIG, CHOLHDL, LDLDIRECT in the last 72 hours. Thyroid function studies: No results for input(s): TSH, T4TOTAL, T3FREE, THYROIDAB in the last 72 hours.  Invalid input(s): FREET3 Anemia work up: No results for input(s): VITAMINB12, FOLATE, FERRITIN, TIBC, IRON, RETICCTPCT in the last 72 hours. Sepsis Labs: Recent Labs  Lab 11/10/24 0400  WBC 4.4    Microbiology No results found for this or any previous visit (from the past 240 hours).  Procedures and diagnostic studies:  No results found.             LOS: 21 days   Tryston Gilliam  [employer] Adjuster on [email or web address]. If 7PM-7AM, please contact night-coverage at [email or web address]     11/10/2024, 4:58 PM

## 2024-11-10 NOTE — Progress Notes (Signed)
 Secure e-mail sent to Corey House with [facility] to provide her with a potential placement contact: [facility] [phone]

## 2024-11-10 NOTE — TOC Progression Note (Signed)
 Transition of Care (TOC) - Progression Note    Patient Details  Name: Corey House. MRN: [id number] Date of Birth: May 28, 1966  Transition of Care [facility]) CM/SW Contact  Delphine KANDICE Bring, RN Phone Number: 11/10/2024, 3:13 PM  Clinical Narrative:    Cm spoke with [name] from the [facility].  She states that she looked on [facility] website. She found that homebound services for state ID. CM asked about the status of his SSI/SSD applications. She states that  she need to do the medical summary  then submit the application. She states that on the  SSID application they use their address, [address] [place], [place] [address].  CM talked with patient this afternoon. CM asked if he called voteriders. Patient states yes but he had to leave a message. He states no one has called him back. CM informed him about homebound services for States ID. Patient in agreement with completing the application. CM had reached out to Dr. Jens to write letter stating he is in the hospital. When MD complete letter, CM will fax application and letter to [phone]   Expected Discharge Plan:  (patient is currently homeless, unsure of discharge location at this time) Barriers to Discharge: Financial Resources               Expected Discharge Plan and Services   Discharge Planning Services: CM Consult   Living arrangements for the past 2 months: Homeless Expected Discharge Date: 11/03/24                                     Social Drivers of Health (SDOH) Interventions SDOH Screenings   Food Insecurity: Food Insecurity Present (10/20/2024)  Housing: High Risk (10/21/2024)  Transportation Needs: Unmet Transportation Needs (10/20/2024)  Utilities: At Risk (10/20/2024)  Alcohol  Screen: Low Risk  (04/23/2024)  Social Connections: Unknown (04/23/2024)  Tobacco Use: High Risk (10/21/2024)    Readmission Risk Interventions     No data to display

## 2024-11-10 NOTE — Progress Notes (Signed)
 Patient was ambulated around the unit by nurse navigator. Patient's oxygen level dropped to 79% RA at just halfway around the unit. O2 applied at 2L with minimal improvement. Oxygen bumped to 4L and patient able to recover to 88% while ambulating. Patient did endorse shortness of breath. Patient returned to room and placed on 3L via [place]. Patient recovered to 91% after 10 min in seated position.   SATURATION QUALIFICATIONS: (This note is used to comply with regulatory documentation for home oxygen)  Patient Saturations on Room Air at Rest = 88%  Patient Saturations on Room Air while Ambulating = 79%  Patient Saturations on 4 Liters of oxygen while Ambulating = 88%  Please briefly explain why patient needs home oxygen: Patient is unable to maintain adequate oxygenation status on RA while ambulating.

## 2024-11-11 DIAGNOSIS — I5033 Acute on chronic diastolic (congestive) heart failure: Secondary | ICD-10-CM | POA: Diagnosis not present

## 2024-11-11 DIAGNOSIS — J441 Chronic obstructive pulmonary disease with (acute) exacerbation: Secondary | ICD-10-CM | POA: Diagnosis not present

## 2024-11-11 DIAGNOSIS — E8729 Other acidosis: Secondary | ICD-10-CM | POA: Diagnosis not present

## 2024-11-11 DIAGNOSIS — J9601 Acute respiratory failure with hypoxia: Secondary | ICD-10-CM | POA: Diagnosis not present

## 2024-11-11 MED ORDER — UMECLIDINIUM-VILANTEROL 62.5-25 MCG/ACT IN AEPB
1.0000 | INHALATION_SPRAY | Freq: Every day | RESPIRATORY_TRACT | Status: DC
  Administered 2024-11-12 – 2024-12-28 (×47): 1 via RESPIRATORY_TRACT
  Filled 2024-11-11 (×4): qty 14

## 2024-11-11 NOTE — TOC Progression Note (Signed)
 Transition of Care (TOC) - Progression Note    Patient Details  Name: Corey House. MRN: [id number] Date of Birth: [birth year]  Transition of Care [facility]) CM/SW Contact  Delphine KANDICE Bring, RN Phone Number: 11/11/2024, 10:22 AM  Clinical Narrative:    CM faxed homebound application and letter from MD to [facility], [phone].     Expected Discharge Plan:  (patient is currently homeless, unsure of discharge location at this time) Barriers to Discharge: Financial Resources               Expected Discharge Plan and Services   Discharge Planning Services: CM Consult   Living arrangements for the past 2 months: Homeless Expected Discharge Date: 11/03/24                                     Social Drivers of Health (SDOH) Interventions SDOH Screenings   Food Insecurity: Food Insecurity Present (10/20/2024)  Housing: High Risk (10/21/2024)  Transportation Needs: Unmet Transportation Needs (10/20/2024)  Utilities: At Risk (10/20/2024)  Alcohol  Screen: Low Risk  (04/23/2024)  Social Connections: Unknown (04/23/2024)  Tobacco Use: High Risk (10/21/2024)    Readmission Risk Interventions     No data to display

## 2024-11-11 NOTE — Progress Notes (Signed)
 Progress Note   Patient: Corey House. FMW:[id number] DOB: 08/20/1966 DOA: 10/20/2024     22 DOS: the patient was seen and examined on 11/11/2024   Brief hospital course: 58 y.o. year old male with past medical history of  tobacco use disorder reporting a 41 year pack per year history, smoking ~1 pack per day since he was 58 years old. He denies any additional medical history though he has not seen a medical provider in ~20 years. He presents to [facility] ED with shortness of breath that began ~ 9 months ago and worsened ~3 months ago. He reports today his dyspnea is not significantly different than it was 3 months ago. He endorses a chronic cough that has been present since childhood. Her ports the cough is currently, minimally productive with white phlegm. Additionally, he endorses bilateral lower extremity swelling that began in the last 2 days, per his report (R) > (L). On exam the swelling is equal bilaterally. He endorses fatigue and dyspnea that is worse with position changes such as bending over and when standing from lying. He denies chest pain, palpitations, or fever.   ED Course: On arrival to [facility] ED patient was noted to be afebrile temp 36.8C, BP 142/111, HR 88, RR 22, SPO2 100% on 15L non-rebreather. Patient now on 2L via nasal cannula with SPO2 96%. CXR obtained and shows non specific findings of increased interstitial markings. CTA PE obtained and shows no PE, dilated RV and reflux of contract into the IVC and hepatic vein consistent with (R) heart failure, chronic bronchitis, and emphysema. US  DVT (B)LE negative for DVT. Labs notable Negative COVID, flu, RSV,for BNP 284.2, BUN 24, troponin 41--> 38 with no report of chest pain.  Lactic acid 0.9.  Blood cultures drawn and in process.  He was given Lasix , DuoNebs, and Solu-Medrol .  TRH contacted for admission.  11/5.  Pulse ox with ambulation down to 85%.  Patient admitted with COPD and CHF exacerbation.  Patient  also states that he is short of breath and he is waiting to die.  Appreciate psychiatric consultation.  Patient is homeless. 11/6.  Patient states that he has been sleepy.  Venous blood gas shows a pCO2 of 79 with a pH of 7.41.  Will start BiPAP at night.  Try to see if we can taper off oxygen. 11/7.  Patient hypoxic with walking around with pulmonary to 87%. 11/8.  Patient's pulse ox dropped down to 84% with ambulation. 11/9.  Patient had a pulse ox of 78% on room air at rest but he stated he walked back from the bathroom. 11/10.  Pulse ox in the low 80s with ambulation.  Likely will need chronic oxygen.  11/26: Patient now medically stable, continue to required oxygen-has become oxygen dependent, patient is homeless and cannot go to shelter with oxygen-TOC is working on placement  Assessment and Plan: * Acute respiratory failure with hypoxia and hypercapnia (HCC) Pulse ox dropped to 82% with ambulation yesterday.  Awaiting pulse ox from today.  The patient is homeless will need to be able to come off oxygen prior to disposition.  Continue to try to see if we can get off oxygen on a daily basis.  VBG shows a pCO2 of 81.  Patient unable to tolerate BiPAP.  The patient likely has acute on chronic respiratory failure.  - Continue to require oxygen, need oxygen for home and he is currently homeless, cannot go to a shelter with oxygen.  COPD with  acute exacerbation [facility]) Pulmonary was on board. Completed a course of antibiotics and prednisone . - Continue with bronchodilator  Acute on chronic diastolic CHF (congestive heart failure) (HCC) EF 55 to 60%.  Initially on IV Lasix .  On 11/6 and gave a dose of acetazolamide  with CO2 elevation on chemistry.  Hold Lasix  today with CO2 on chemistry up at 38.  CT scan commenting on elevated right heart pressures.  No current signs of heart failure.  Tobacco use disorder Must stop smoking.  Headache Improved.  Tylenol  as needed.  IV magnesium  given on  11/5.  Increased anion gap metabolic acidosis Resolved  Hypernatremia Resolved  Depression Patient on Lexapro  and Seroquel  at night  Protein-calorie malnutrition, severe Continue supplements      Subjective: Patient was eating lunch when seen today.  No new concern  Physical Exam: Vitals:   11/11/24 0500 11/11/24 0500 11/11/24 0748 11/11/24 1600  BP:  100/77 116/83 108/80  Pulse:  79 81 74  Resp:  16 17 18   Temp:  97.8 F (36.6 C) 97.7 F (36.5 C) 98.2 F (36.8 C)  TempSrc:  Oral Oral Oral  SpO2:  96% 95% 94%  Weight: 55.6 kg     Height:       General.  Malnourished gentleman, in no acute distress. Pulmonary.  Lungs clear bilaterally, normal respiratory effort. CV.  Regular rate and rhythm, no JVD, rub or murmur. Abdomen.  Soft, nontender, nondistended, BS positive. CNS.  Alert and oriented .  No focal neurologic deficit. Extremities.  No edema, no cyanosis, pulses intact and symmetrical. Psychiatry.  Judgment and insight appears normal.   Data Reviewed: Prior data reviewed  Family Communication: Discussed with patient  Disposition: Status is: Inpatient Remains inpatient appropriate because: Unsafe discharge  Planned Discharge Destination: Likely group home  DVT prophylaxis.  Lovenox  Time spent: 45 minutes  This record has been created using Conservation officer, historic buildings. Errors have been sought and corrected,but may not always be located. Such creation errors do not reflect on the standard of care.   Author: Amaryllis Dare, MD 11/11/2024 6:12 PM  For on call review [email or web address].

## 2024-11-12 DIAGNOSIS — J9601 Acute respiratory failure with hypoxia: Secondary | ICD-10-CM | POA: Diagnosis not present

## 2024-11-12 DIAGNOSIS — J441 Chronic obstructive pulmonary disease with (acute) exacerbation: Secondary | ICD-10-CM | POA: Diagnosis not present

## 2024-11-12 DIAGNOSIS — I5033 Acute on chronic diastolic (congestive) heart failure: Secondary | ICD-10-CM | POA: Diagnosis not present

## 2024-11-12 DIAGNOSIS — E8729 Other acidosis: Secondary | ICD-10-CM | POA: Diagnosis not present

## 2024-11-12 NOTE — Plan of Care (Signed)

## 2024-11-12 NOTE — Progress Notes (Signed)
 Progress Note   Patient: Corey House. FMW:[id number] DOB: 08/31/1966 DOA: 10/20/2024     23 DOS: the patient was seen and examined on 11/12/2024   Brief hospital course: 58 y.o. year old male with past medical history of  tobacco use disorder reporting a 41 year pack per year history, smoking ~1 pack per day since he was 58 years old. He denies any additional medical history though he has not seen a medical provider in ~20 years. He presents to [facility] ED with shortness of breath that began ~ 9 months ago and worsened ~3 months ago. He reports today his dyspnea is not significantly different than it was 3 months ago. He endorses a chronic cough that has been present since childhood. Her ports the cough is currently, minimally productive with white phlegm. Additionally, he endorses bilateral lower extremity swelling that began in the last 2 days, per his report (R) > (L). On exam the swelling is equal bilaterally. He endorses fatigue and dyspnea that is worse with position changes such as bending over and when standing from lying. He denies chest pain, palpitations, or fever.   ED Course: On arrival to [facility] ED patient was noted to be afebrile temp 36.8C, BP 142/111, HR 88, RR 22, SPO2 100% on 15L non-rebreather. Patient now on 2L via nasal cannula with SPO2 96%. CXR obtained and shows non specific findings of increased interstitial markings. CTA PE obtained and shows no PE, dilated RV and reflux of contract into the IVC and hepatic vein consistent with (R) heart failure, chronic bronchitis, and emphysema. US  DVT (B)LE negative for DVT. Labs notable Negative COVID, flu, RSV,for BNP 284.2, BUN 24, troponin 41--> 38 with no report of chest pain.  Lactic acid 0.9.  Blood cultures drawn and in process.  He was given Lasix , DuoNebs, and Solu-Medrol .  TRH contacted for admission.  11/5.  Pulse ox with ambulation down to 85%.  Patient admitted with COPD and CHF exacerbation.  Patient  also states that he is short of breath and he is waiting to die.  Appreciate psychiatric consultation.  Patient is homeless. 11/6.  Patient states that he has been sleepy.  Venous blood gas shows a pCO2 of 79 with a pH of 7.41.  Will start BiPAP at night.  Try to see if we can taper off oxygen. 11/7.  Patient hypoxic with walking around with pulmonary to 87%. 11/8.  Patient's pulse ox dropped down to 84% with ambulation. 11/9.  Patient had a pulse ox of 78% on room air at rest but he stated he walked back from the bathroom. 11/10.  Pulse ox in the low 80s with ambulation.  Likely will need chronic oxygen.  11/26: Patient now medically stable, continue to required oxygen-has become oxygen dependent, patient is homeless and cannot go to shelter with oxygen-TOC is working on placement  Assessment and Plan: * Acute respiratory failure with hypoxia and hypercapnia (HCC) Pulse ox dropped to 82% with ambulation yesterday.  Awaiting pulse ox from today.  The patient is homeless will need to be able to come off oxygen prior to disposition.  Continue to try to see if we can get off oxygen on a daily basis.  VBG shows a pCO2 of 81.  Patient unable to tolerate BiPAP.  The patient likely has acute on chronic respiratory failure.  - Continue to require oxygen, need oxygen for home and he is currently homeless, cannot go to a shelter with oxygen.  COPD with  acute exacerbation [facility]) Pulmonary was on board. Completed a course of antibiotics and prednisone . - Continue with bronchodilator  Acute on chronic diastolic CHF (congestive heart failure) (HCC) EF 55 to 60%.  Initially on IV Lasix .  On 11/6 and gave a dose of acetazolamide  with CO2 elevation on chemistry.  Hold Lasix  today with CO2 on chemistry up at 38.  CT scan commenting on elevated right heart pressures.  No current signs of heart failure.  Tobacco use disorder Must stop smoking.  Headache Improved.  Tylenol  as needed.  IV magnesium  given on  11/5.  Increased anion gap metabolic acidosis Resolved  Hypernatremia Resolved  Depression Patient on Lexapro  and Seroquel  at night  Protein-calorie malnutrition, severe Continue supplements      Subjective: Patient with no new concern.  Physical Exam: Vitals:   11/11/24 2027 11/12/24 0457 11/12/24 0709 11/12/24 0809  BP: 115/87 114/83  117/79  Pulse: 82 84  79  Resp: 18 18  16   Temp: 98.4 F (36.9 C) 98.3 F (36.8 C)  98.4 F (36.9 C)  TempSrc:      SpO2: 95% 94%  95%  Weight:   57.1 kg   Height:       General.  Malnourished gentleman, in no acute distress. Pulmonary.  Lungs clear bilaterally, normal respiratory effort. CV.  Regular rate and rhythm, no JVD, rub or murmur. Abdomen.  Soft, nontender, nondistended, BS positive. CNS.  Alert and oriented .  No focal neurologic deficit. Extremities.  No edema, no cyanosis, pulses intact and symmetrical. Psychiatry.  Judgment and insight appears normal.   Data Reviewed: Prior data reviewed  Family Communication: Discussed with patient  Disposition: Status is: Inpatient Remains inpatient appropriate because: Unsafe discharge  Planned Discharge Destination: Likely group home  DVT prophylaxis.  Lovenox  Time spent: 44 minutes  This record has been created using Conservation officer, historic buildings. Errors have been sought and corrected,but may not always be located. Such creation errors do not reflect on the standard of care.   Author: Amaryllis Dare, MD 11/12/2024 3:42 PM  For on call review [email or web address].

## 2024-11-13 ENCOUNTER — Other Ambulatory Visit (HOSPITAL_COMMUNITY): Payer: Self-pay

## 2024-11-13 ENCOUNTER — Telehealth (HOSPITAL_COMMUNITY): Payer: Self-pay | Admitting: Pharmacy Technician

## 2024-11-13 DIAGNOSIS — J9601 Acute respiratory failure with hypoxia: Secondary | ICD-10-CM | POA: Diagnosis not present

## 2024-11-13 DIAGNOSIS — E8729 Other acidosis: Secondary | ICD-10-CM | POA: Diagnosis not present

## 2024-11-13 DIAGNOSIS — I5033 Acute on chronic diastolic (congestive) heart failure: Secondary | ICD-10-CM | POA: Diagnosis not present

## 2024-11-13 DIAGNOSIS — J441 Chronic obstructive pulmonary disease with (acute) exacerbation: Secondary | ICD-10-CM | POA: Diagnosis not present

## 2024-11-13 NOTE — Telephone Encounter (Signed)
 Patient Product/process Development Scientist completed.    The patient is insured through [employer] [place].     Ran test claim for Anoro Ellipta  and the current 30 day co-pay is $4.00.   This test claim was processed through [facility]- copay amounts may vary at other pharmacies due to pharmacy/plan contracts, or as the patient moves through the different stages of their insurance plan.     Reyes Sharps, CPHT Pharmacy Technician Patient Advocate Specialist Lead [facility] Patient Advocate Team Direct Number: [phone]  Fax: [phone]

## 2024-11-13 NOTE — Progress Notes (Signed)
 Progress Note   Patient: Corey House. FMW:[id number] DOB: 03-28-1966 DOA: 10/20/2024     24 DOS: the patient was seen and examined on 11/13/2024   Brief hospital course: 58 y.o. year old male with past medical history of  tobacco use disorder reporting a 41 year pack per year history, smoking ~1 pack per day since he was 58 years old. He denies any additional medical history though he has not seen a medical provider in ~20 years. He presents to [facility] ED with shortness of breath that began ~ 9 months ago and worsened ~3 months ago. He reports today his dyspnea is not significantly different than it was 3 months ago. He endorses a chronic cough that has been present since childhood. Her ports the cough is currently, minimally productive with white phlegm. Additionally, he endorses bilateral lower extremity swelling that began in the last 2 days, per his report (R) > (L). On exam the swelling is equal bilaterally. He endorses fatigue and dyspnea that is worse with position changes such as bending over and when standing from lying. He denies chest pain, palpitations, or fever.   ED Course: On arrival to [facility] ED patient was noted to be afebrile temp 36.8C, BP 142/111, HR 88, RR 22, SPO2 100% on 15L non-rebreather. Patient now on 2L via nasal cannula with SPO2 96%. CXR obtained and shows non specific findings of increased interstitial markings. CTA PE obtained and shows no PE, dilated RV and reflux of contract into the IVC and hepatic vein consistent with (R) heart failure, chronic bronchitis, and emphysema. US  DVT (B)LE negative for DVT. Labs notable Negative COVID, flu, RSV,for BNP 284.2, BUN 24, troponin 41--> 38 with no report of chest pain.  Lactic acid 0.9.  Blood cultures drawn and in process.  He was given Lasix , DuoNebs, and Solu-Medrol .  TRH contacted for admission.  11/5.  Pulse ox with ambulation down to 85%.  Patient admitted with COPD and CHF exacerbation.  Patient  also states that he is short of breath and he is waiting to die.  Appreciate psychiatric consultation.  Patient is homeless. 11/6.  Patient states that he has been sleepy.  Venous blood gas shows a pCO2 of 79 with a pH of 7.41.  Will start BiPAP at night.  Try to see if we can taper off oxygen. 11/7.  Patient hypoxic with walking around with pulmonary to 87%. 11/8.  Patient's pulse ox dropped down to 84% with ambulation. 11/9.  Patient had a pulse ox of 78% on room air at rest but he stated he walked back from the bathroom. 11/10.  Pulse ox in the low 80s with ambulation.  Likely will need chronic oxygen.  11/26: Patient now medically stable, continue to required oxygen-has become oxygen dependent, patient is homeless and cannot go to shelter with oxygen-TOC is working on placement  Assessment and Plan: * Acute respiratory failure with hypoxia and hypercapnia (HCC) Pulse ox dropped to 82% with ambulation yesterday.  Awaiting pulse ox from today.  The patient is homeless will need to be able to come off oxygen prior to disposition.  Continue to try to see if we can get off oxygen on a daily basis.  VBG shows a pCO2 of 81.  Patient unable to tolerate BiPAP.  The patient likely has acute on chronic respiratory failure.  - Continue to require oxygen, need oxygen for home and he is currently homeless, cannot go to a shelter with oxygen.  COPD with  acute exacerbation [facility]) Pulmonary was on board. Completed a course of antibiotics and prednisone . - Continue with bronchodilator  Acute on chronic diastolic CHF (congestive heart failure) (HCC) EF 55 to 60%.  Initially on IV Lasix .  On 11/6 and gave a dose of acetazolamide  with CO2 elevation on chemistry.  Hold Lasix  today with CO2 on chemistry up at 38.  CT scan commenting on elevated right heart pressures.  No current signs of heart failure.  Tobacco use disorder Must stop smoking.  Headache Improved.  Tylenol  as needed.  IV magnesium  given on  11/5.  Increased anion gap metabolic acidosis Resolved  Hypernatremia Resolved  Depression Patient on Lexapro  and Seroquel  at night  Protein-calorie malnutrition, severe Continue supplements      Subjective: Patient was resting comfortably and wants to get some sleep when seen today.  Physical Exam: Vitals:   11/12/24 1959 11/13/24 0047 11/13/24 0458 11/13/24 0734  BP: (!) 127/94  104/80 103/77  Pulse: 83  81 80  Resp:   18 16  Temp:   98.6 F (37 C) 98.6 F (37 C)  TempSrc:   Oral   SpO2: 94%  97% 97%  Weight:  56.9 kg 56.9 kg   Height:  5' 4.02 (1.626 m) 5' 4.02 (1.626 m)    General.  Malnourished gentleman, in no acute distress. Pulmonary.  Lungs clear bilaterally, normal respiratory effort. CV.  Regular rate and rhythm, no JVD, rub or murmur. Abdomen.  Soft, nontender, nondistended, BS positive. CNS.  Alert and oriented .  No focal neurologic deficit. Extremities.  No edema, no cyanosis, pulses intact and symmetrical. Psychiatry.  Judgment and insight appears normal.   Data Reviewed: Prior data reviewed  Family Communication: Discussed with patient  Disposition: Status is: Inpatient Remains inpatient appropriate because: Unsafe discharge  Planned Discharge Destination: Likely group home  DVT prophylaxis.  Lovenox  Time spent: 42 minutes  This record has been created using Conservation officer, historic buildings. Errors have been sought and corrected,but may not always be located. Such creation errors do not reflect on the standard of care.   Author: Amaryllis Dare, MD 11/13/2024 3:05 PM  For on call review [email or web address].

## 2024-11-14 DIAGNOSIS — E8729 Other acidosis: Secondary | ICD-10-CM | POA: Diagnosis not present

## 2024-11-14 DIAGNOSIS — J441 Chronic obstructive pulmonary disease with (acute) exacerbation: Secondary | ICD-10-CM | POA: Diagnosis not present

## 2024-11-14 DIAGNOSIS — I5033 Acute on chronic diastolic (congestive) heart failure: Secondary | ICD-10-CM | POA: Diagnosis not present

## 2024-11-14 DIAGNOSIS — J9601 Acute respiratory failure with hypoxia: Secondary | ICD-10-CM | POA: Diagnosis not present

## 2024-11-14 NOTE — Plan of Care (Signed)
  Problem: Education: Goal: Knowledge of General Education information will improve Description: Including pain rating scale, medication(s)/side effects and non-pharmacologic comfort measures Outcome: Progressing   Problem: Health Behavior/Discharge Planning: Goal: Ability to manage health-related needs will improve Outcome: Progressing   Problem: Clinical Measurements: Goal: Will remain free from infection Outcome: Progressing   Problem: Activity: Goal: Risk for activity intolerance will decrease Outcome: Progressing   Problem: Nutrition: Goal: Adequate nutrition will be maintained Outcome: Progressing   Problem: Elimination: Goal: Will not experience complications related to bowel motility Outcome: Progressing Goal: Will not experience complications related to urinary retention Outcome: Progressing

## 2024-11-14 NOTE — Progress Notes (Signed)
 Progress Note   Patient: Corey House. FMW:[id number] DOB: 25-Nov-1966 DOA: 10/20/2024     25 DOS: the patient was seen and examined on 11/14/2024   Brief hospital course: 58 y.o. year old male with past medical history of  tobacco use disorder reporting a 41 year pack per year history, smoking ~1 pack per day since he was 58 years old. He denies any additional medical history though he has not seen a medical provider in ~20 years. He presents to [facility] ED with shortness of breath that began ~ 9 months ago and worsened ~3 months ago. He reports today his dyspnea is not significantly different than it was 3 months ago. He endorses a chronic cough that has been present since childhood. Her ports the cough is currently, minimally productive with white phlegm. Additionally, he endorses bilateral lower extremity swelling that began in the last 2 days, per his report (R) > (L). On exam the swelling is equal bilaterally. He endorses fatigue and dyspnea that is worse with position changes such as bending over and when standing from lying. He denies chest pain, palpitations, or fever.   ED Course: On arrival to [facility] ED patient was noted to be afebrile temp 36.8C, BP 142/111, HR 88, RR 22, SPO2 100% on 15L non-rebreather. Patient now on 2L via nasal cannula with SPO2 96%. CXR obtained and shows non specific findings of increased interstitial markings. CTA PE obtained and shows no PE, dilated RV and reflux of contract into the IVC and hepatic vein consistent with (R) heart failure, chronic bronchitis, and emphysema. US  DVT (B)LE negative for DVT. Labs notable Negative COVID, flu, RSV,for BNP 284.2, BUN 24, troponin 41--> 38 with no report of chest pain.  Lactic acid 0.9.  Blood cultures drawn and in process.  He was given Lasix , DuoNebs, and Solu-Medrol .  TRH contacted for admission.  11/5.  Pulse ox with ambulation down to 85%.  Patient admitted with COPD and CHF exacerbation.  Patient  also states that he is short of breath and he is waiting to die.  Appreciate psychiatric consultation.  Patient is homeless. 11/6.  Patient states that he has been sleepy.  Venous blood gas shows a pCO2 of 79 with a pH of 7.41.  Will start BiPAP at night.  Try to see if we can taper off oxygen. 11/7.  Patient hypoxic with walking around with pulmonary to 87%. 11/8.  Patient's pulse ox dropped down to 84% with ambulation. 11/9.  Patient had a pulse ox of 78% on room air at rest but he stated he walked back from the bathroom. 11/10.  Pulse ox in the low 80s with ambulation.  Likely will need chronic oxygen.  11/26: Patient now medically stable, continue to required oxygen-has become oxygen dependent, patient is homeless and cannot go to shelter with oxygen-TOC is working on placement  Assessment and Plan: * Acute respiratory failure with hypoxia and hypercapnia (HCC) Pulse ox dropped to 82% with ambulation yesterday.  Awaiting pulse ox from today.  The patient is homeless will need to be able to come off oxygen prior to disposition.  Continue to try to see if we can get off oxygen on a daily basis.  VBG shows a pCO2 of 81.  Patient unable to tolerate BiPAP.  The patient likely has acute on chronic respiratory failure.  - Continue to require oxygen, need oxygen for home and he is currently homeless, cannot go to a shelter with oxygen.  COPD with  acute exacerbation [facility]) Pulmonary was on board. Completed a course of antibiotics and prednisone . - Continue with bronchodilator  Acute on chronic diastolic CHF (congestive heart failure) (HCC) EF 55 to 60%.  Initially on IV Lasix .  On 11/6 and gave a dose of acetazolamide  with CO2 elevation on chemistry.  Hold Lasix  today with CO2 on chemistry up at 38.  CT scan commenting on elevated right heart pressures.  No current signs of heart failure.  Tobacco use disorder Must stop smoking.  Headache Improved.  Tylenol  as needed.  IV magnesium  given on  11/5.  Increased anion gap metabolic acidosis Resolved  Hypernatremia Resolved  Depression Patient on Lexapro  and Seroquel  at night  Protein-calorie malnutrition, severe Continue supplements      Subjective: Patient was seen and examined today.  No new concern.  Per patient he was walking around earlier.  Physical Exam: Vitals:   11/13/24 1952 11/14/24 0427 11/14/24 0500 11/14/24 0850  BP: 105/75 118/75  109/74  Pulse: 81 83  83  Resp: 18 16  18   Temp: 98.7 F (37.1 C) 97.6 F (36.4 C)  98.2 F (36.8 C)  TempSrc:    Oral  SpO2: 96% 90%  95%  Weight:   56.2 kg   Height:       General. Malnourished gentleman, in no acute distress. Pulmonary.  Lungs clear bilaterally, normal respiratory effort. CV.  Regular rate and rhythm, no JVD, rub or murmur. Abdomen.  Soft, nontender, nondistended, BS positive. CNS.  Alert and oriented .  No focal neurologic deficit. Extremities.  No edema,  pulses intact and symmetrical. Psychiatry.  Judgment and insight appears normal.   Data Reviewed: Prior data reviewed  Family Communication: Discussed with patient  Disposition: Status is: Inpatient Remains inpatient appropriate because: Unsafe discharge  Planned Discharge Destination: Likely group home  DVT prophylaxis.  Lovenox  Time spent: 40 minutes  This record has been created using Conservation officer, historic buildings. Errors have been sought and corrected,but may not always be located. Such creation errors do not reflect on the standard of care.   Author: Amaryllis Dare, MD 11/14/2024 2:15 PM  For on call review [email or web address].

## 2024-11-15 DIAGNOSIS — E8729 Other acidosis: Secondary | ICD-10-CM | POA: Diagnosis not present

## 2024-11-15 DIAGNOSIS — J9601 Acute respiratory failure with hypoxia: Secondary | ICD-10-CM | POA: Diagnosis not present

## 2024-11-15 DIAGNOSIS — I5033 Acute on chronic diastolic (congestive) heart failure: Secondary | ICD-10-CM | POA: Diagnosis not present

## 2024-11-15 DIAGNOSIS — J441 Chronic obstructive pulmonary disease with (acute) exacerbation: Secondary | ICD-10-CM | POA: Diagnosis not present

## 2024-11-15 NOTE — Plan of Care (Signed)
  Problem: Clinical Measurements: Goal: Ability to maintain clinical measurements within normal limits will improve Outcome: Not Progressing Goal: Will remain free from infection Outcome: Progressing Goal: Diagnostic test results will improve Outcome: Progressing Goal: Respiratory complications will improve Outcome: Progressing   Problem: Activity: Goal: Risk for activity intolerance will decrease Outcome: Progressing

## 2024-11-15 NOTE — Progress Notes (Signed)
 Progress Note   Patient: Corey House. FMW:[id number] DOB: [birth year] DOA: 10/20/2024     26 DOS: the patient was seen and examined on 11/15/2024   Brief hospital course: 58 y.o. year old male with past medical history of  tobacco use disorder reporting a 41 year pack per year history, smoking ~1 pack per day since he was 58 years old. He denies any additional medical history though he has not seen a medical provider in ~20 years. He presents to [facility] ED with shortness of breath that began ~ 9 months ago and worsened ~3 months ago. He reports today his dyspnea is not significantly different than it was 3 months ago. He endorses a chronic cough that has been present since childhood. Her ports the cough is currently, minimally productive with white phlegm. Additionally, he endorses bilateral lower extremity swelling that began in the last 2 days, per his report (R) > (L). On exam the swelling is equal bilaterally. He endorses fatigue and dyspnea that is worse with position changes such as bending over and when standing from lying. He denies chest pain, palpitations, or fever.   ED Course: On arrival to [facility] ED patient was noted to be afebrile temp 36.8C, BP 142/111, HR 88, RR 22, SPO2 100% on 15L non-rebreather. Patient now on 2L via nasal cannula with SPO2 96%. CXR obtained and shows non specific findings of increased interstitial markings. CTA PE obtained and shows no PE, dilated RV and reflux of contract into the IVC and hepatic vein consistent with (R) heart failure, chronic bronchitis, and emphysema. US  DVT (B)LE negative for DVT. Labs notable Negative COVID, flu, RSV,for BNP 284.2, BUN 24, troponin 41--> 38 with no report of chest pain.  Lactic acid 0.9.  Blood cultures drawn and in process.  He was given Lasix , DuoNebs, and Solu-Medrol .  TRH contacted for admission.  11/5.  Pulse ox with ambulation down to 85%.  Patient admitted with COPD and CHF exacerbation.  Patient  also states that he is short of breath and he is waiting to die.  Appreciate psychiatric consultation.  Patient is homeless. 11/6.  Patient states that he has been sleepy.  Venous blood gas shows a pCO2 of 79 with a pH of 7.41.  Will start BiPAP at night.  Try to see if we can taper off oxygen. 11/7.  Patient hypoxic with walking around with pulmonary to 87%. 11/8.  Patient's pulse ox dropped down to 84% with ambulation. 11/9.  Patient had a pulse ox of 78% on room air at rest but he stated he walked back from the bathroom. 11/10.  Pulse ox in the low 80s with ambulation.  Likely will need chronic oxygen.  11/26: Patient now medically stable, continue to required oxygen-has become oxygen dependent, patient is homeless and cannot go to shelter with oxygen-TOC is working on placement  Assessment and Plan: * Acute respiratory failure with hypoxia and hypercapnia (HCC) Pulse ox dropped to 82% with ambulation yesterday.  Awaiting pulse ox from today.  The patient is homeless will need to be able to come off oxygen prior to disposition.  Continue to try to see if we can get off oxygen on a daily basis.  VBG shows a pCO2 of 81.  Patient unable to tolerate BiPAP.  The patient likely has acute on chronic respiratory failure.  - Continue to require oxygen, need oxygen for home and he is currently homeless, cannot go to a shelter with oxygen.  COPD with  acute exacerbation [facility]) Pulmonary was on board. Completed a course of antibiotics and prednisone . - Continue with bronchodilator  Acute on chronic diastolic CHF (congestive heart failure) (HCC) EF 55 to 60%.  Initially on IV Lasix .  On 11/6 and gave a dose of acetazolamide  with CO2 elevation on chemistry.  Hold Lasix  today with CO2 on chemistry up at 38.  CT scan commenting on elevated right heart pressures.  No current signs of heart failure.  Tobacco use disorder Must stop smoking.  Headache Improved.  Tylenol  as needed.  IV magnesium  given on  11/5.  Increased anion gap metabolic acidosis Resolved  Hypernatremia Resolved  Depression Patient on Lexapro  and Seroquel  at night  Protein-calorie malnutrition, severe Continue supplements      Subjective: Patient was walking around in the room when seen today.  No new concern  Physical Exam: Vitals:   11/14/24 1941 11/15/24 0411 11/15/24 0500 11/15/24 0754  BP: 108/72 111/81  117/86  Pulse: 78 72  77  Resp: 18 18  16   Temp: 97.7 F (36.5 C) (!) 97.5 F (36.4 C)  98 F (36.7 C)  TempSrc: Oral Oral    SpO2: 97% 99%  98%  Weight:   56.2 kg   Height:       General.  Malnourished gentleman, in no acute distress. Pulmonary.  Lungs clear bilaterally, normal respiratory effort. CV.  Regular rate and rhythm, no JVD, rub or murmur. Abdomen.  Soft, nontender, nondistended, BS positive. CNS.  Alert and oriented .  No focal neurologic deficit. Extremities.  No edema, pulses intact and symmetrical.  Data Reviewed: Prior data reviewed  Family Communication: Discussed with patient  Disposition: Status is: Inpatient Remains inpatient appropriate because: Unsafe discharge  Planned Discharge Destination: Likely group home  DVT prophylaxis.  Lovenox  Time spent: 39 minutes  This record has been created using Conservation officer, historic buildings. Errors have been sought and corrected,but may not always be located. Such creation errors do not reflect on the standard of care.   Author: Amaryllis Dare, MD 11/15/2024 3:10 PM  For on call review [email or web address].

## 2024-11-16 DIAGNOSIS — E870 Hyperosmolality and hypernatremia: Secondary | ICD-10-CM | POA: Diagnosis not present

## 2024-11-16 DIAGNOSIS — J9601 Acute respiratory failure with hypoxia: Secondary | ICD-10-CM | POA: Diagnosis not present

## 2024-11-16 DIAGNOSIS — E43 Unspecified severe protein-calorie malnutrition: Secondary | ICD-10-CM | POA: Diagnosis not present

## 2024-11-16 DIAGNOSIS — I5033 Acute on chronic diastolic (congestive) heart failure: Secondary | ICD-10-CM | POA: Diagnosis not present

## 2024-11-16 DIAGNOSIS — J9602 Acute respiratory failure with hypercapnia: Secondary | ICD-10-CM | POA: Diagnosis not present

## 2024-11-16 DIAGNOSIS — E8729 Other acidosis: Secondary | ICD-10-CM | POA: Diagnosis not present

## 2024-11-16 DIAGNOSIS — J441 Chronic obstructive pulmonary disease with (acute) exacerbation: Secondary | ICD-10-CM | POA: Diagnosis not present

## 2024-11-16 MED ORDER — ESCITALOPRAM OXALATE 10 MG PO TABS
20.0000 mg | ORAL_TABLET | Freq: Every day | ORAL | Status: AC
  Administered 2024-11-17 – 2025-01-22 (×67): 20 mg via ORAL
  Filled 2024-11-16 (×60): qty 2

## 2024-11-16 NOTE — Assessment & Plan Note (Addendum)
 Increased Seroquel  to 50 mg at night on 12/18/2024.  On Xanax  and Lexapro .  Added Wellbutrin  on 12/22.  Patient asked to come off the Wellbutrin  on 1/5 secondary to feeling nauseous.  Wellbutrin  discontinued.

## 2024-11-16 NOTE — Plan of Care (Signed)
  Problem: Health Behavior/Discharge Planning: Goal: Ability to manage health-related needs will improve Outcome: Progressing   Problem: Clinical Measurements: Goal: Ability to maintain clinical measurements within normal limits will improve Outcome: Progressing Goal: Respiratory complications will improve Outcome: Progressing   Problem: Activity: Goal: Risk for activity intolerance will decrease Outcome: Progressing   

## 2024-11-16 NOTE — Progress Notes (Signed)
 Progress Note   Patient: Corey House. FMW:[id number] DOB: 1966/01/01 DOA: 10/20/2024     27 DOS: the patient was seen and examined on 11/16/2024   Brief hospital course: 58 y.o. year old male with past medical history of  tobacco use disorder reporting a 41 year pack per year history, smoking ~1 pack per day since he was 58 years old. He denies any additional medical history though he has not seen a medical provider in ~20 years. He presents to [facility] ED with shortness of breath that began ~ 9 months ago and worsened ~3 months ago. He reports today his dyspnea is not significantly different than it was 3 months ago. He endorses a chronic cough that has been present since childhood. Her ports the cough is currently, minimally productive with white phlegm. Additionally, he endorses bilateral lower extremity swelling that began in the last 2 days, per his report (R) > (L). On exam the swelling is equal bilaterally. He endorses fatigue and dyspnea that is worse with position changes such as bending over and when standing from lying. He denies chest pain, palpitations, or fever.   ED Course: On arrival to [facility] ED patient was noted to be afebrile temp 36.8C, BP 142/111, HR 88, RR 22, SPO2 100% on 15L non-rebreather. Patient now on 2L via nasal cannula with SPO2 96%. CXR obtained and shows non specific findings of increased interstitial markings. CTA PE obtained and shows no PE, dilated RV and reflux of contract into the IVC and hepatic vein consistent with (R) heart failure, chronic bronchitis, and emphysema. US  DVT (B)LE negative for DVT. Labs notable Negative COVID, flu, RSV,for BNP 284.2, BUN 24, troponin 41--> 38 with no report of chest pain.  Lactic acid 0.9.  Blood cultures drawn and in process.  He was given Lasix , DuoNebs, and Solu-Medrol .  TRH contacted for admission.  11/5.  Pulse ox with ambulation down to 85%.  Patient admitted with COPD and CHF exacerbation.  Patient  also states that he is short of breath and he is waiting to die.  Appreciate psychiatric consultation.  Patient is homeless. 11/6.  Patient states that he has been sleepy.  Venous blood gas shows a pCO2 of 79 with a pH of 7.41.  Will start BiPAP at night.  Try to see if we can taper off oxygen. 11/7.  Patient hypoxic with walking around with pulmonary to 87%. 11/8.  Patient's pulse ox dropped down to 84% with ambulation. 11/9.  Patient had a pulse ox of 78% on room air at rest but he stated he walked back from the bathroom. 11/10.  Pulse ox in the low 80s with ambulation.  Likely will need chronic oxygen.  11/26: Patient now medically stable, continue to required oxygen-has become oxygen dependent, patient is homeless and cannot go to shelter with oxygen-TOC is working on placement  12/1: Still feeling depressed, increasing the dose of Lexapro  to 20 mg daily  Assessment and Plan: * Acute respiratory failure with hypoxia and hypercapnia (HCC) Pulse ox dropped to 82% with ambulation yesterday.  Awaiting pulse ox from today.  The patient is homeless will need to be able to come off oxygen prior to disposition.  Continue to try to see if we can get off oxygen on a daily basis.  VBG shows a pCO2 of 81.  Patient unable to tolerate BiPAP.  The patient likely has acute on chronic respiratory failure.  - Continue to require oxygen, need oxygen for home and  he is currently homeless, cannot go to a shelter with oxygen.  COPD with acute exacerbation (HCC) Pulmonary was on board. Completed a course of antibiotics and prednisone . - Continue with bronchodilator  Acute on chronic diastolic CHF (congestive heart failure) (HCC) EF 55 to 60%.  Initially on IV Lasix .  On 11/6 and gave a dose of acetazolamide  with CO2 elevation on chemistry.  Hold Lasix  today with CO2 on chemistry up at 38.  CT scan commenting on elevated right heart pressures.  No current signs of heart failure.  Tobacco use disorder Must stop  smoking.  Headache Improved.  Tylenol  as needed.  IV magnesium  given on 11/5.  Increased anion gap metabolic acidosis Resolved  Hypernatremia Resolved  Depression Patient on Lexapro  and Seroquel  at night - Lexapro  dose was increased to 20 mg daily today  Protein-calorie malnutrition, severe Continue supplements      Subjective: Patient was seen and examined today.  Still feeling depressed.  No other concerns.  Physical Exam: Vitals:   11/15/24 2022 11/16/24 0448 11/16/24 0500 11/16/24 0840  BP: 103/71 109/79  113/78  Pulse: 81 73  79  Resp: 18 18  16   Temp: 98 F (36.7 C) 98 F (36.7 C)  (!) 97.5 F (36.4 C)  TempSrc: Oral Oral    SpO2: 98% 97%  96%  Weight:   54.7 kg   Height:       General.  Malnourished gentleman, in no acute distress. Pulmonary.  Lungs clear bilaterally, normal respiratory effort. CV.  Regular rate and rhythm, no JVD, rub or murmur. Abdomen.  Soft, nontender, nondistended, BS positive. CNS.  Alert and oriented .  No focal neurologic deficit. Extremities.  No edema, no cyanosis, pulses intact and symmetrical. Psychiatry.  Judgment and insight appears normal.   Data Reviewed: Prior data reviewed  Family Communication: Discussed with patient  Disposition: Status is: Inpatient Remains inpatient appropriate because: Unsafe discharge  Planned Discharge Destination: Likely group home  DVT prophylaxis.  Lovenox  Time spent: 40 minutes  This record has been created using Conservation officer, historic buildings. Errors have been sought and corrected,but may not always be located. Such creation errors do not reflect on the standard of care.   Author: Amaryllis Dare, MD 11/16/2024 3:59 PM  For on call review [email or web address].

## 2024-11-17 DIAGNOSIS — J9601 Acute respiratory failure with hypoxia: Secondary | ICD-10-CM | POA: Diagnosis not present

## 2024-11-17 DIAGNOSIS — I5033 Acute on chronic diastolic (congestive) heart failure: Secondary | ICD-10-CM | POA: Diagnosis not present

## 2024-11-17 DIAGNOSIS — J441 Chronic obstructive pulmonary disease with (acute) exacerbation: Secondary | ICD-10-CM | POA: Diagnosis not present

## 2024-11-17 DIAGNOSIS — E8729 Other acidosis: Secondary | ICD-10-CM | POA: Diagnosis not present

## 2024-11-17 MED ORDER — ACETAMINOPHEN-CAFFEINE 500-65 MG PO TABS
2.0000 | ORAL_TABLET | Freq: Three times a day (TID) | ORAL | Status: DC | PRN
  Administered 2024-11-17 – 2024-11-25 (×6): 2 via ORAL
  Filled 2024-11-17 (×8): qty 2

## 2024-11-17 MED ORDER — ASPIRIN-ACETAMINOPHEN-CAFFEINE 250-250-65 MG PO TABS: 2.0000 | ORAL_TABLET | Freq: Three times a day (TID) | ORAL | Status: DC | PRN

## 2024-11-17 NOTE — Plan of Care (Signed)

## 2024-11-17 NOTE — Progress Notes (Signed)
 Rounded on patient, in conjunction with therapy dog Rollo. Patient expressed feeling happy and thankful for visit by therapy dog and handler. Patient states he didn't sleep well last night and c/o HA. Bedside nurse made aware. Patient endorses continued depression and some anxiety surrounding disposition and need for long-term oxygen. Assured our teams, along with [facility], were working diligently to find an appropriate disposition. Patient again expressed thankfulness for care and concern.

## 2024-11-17 NOTE — TOC Progression Note (Signed)
 Transition of Care (TOC) - Progression Note    Patient Details  Name: Corey House. MRN: [id number] Date of Birth: [birth year]  Transition of Care [facility]) CM/SW Contact  Delphine KANDICE Bring, RN Phone Number: 11/17/2024, 10:10 AM  Clinical Narrative:    CM received a call from Zelvia at [facility]. She states that they may a potential group home for patient. She states that the Care extender at [facility] have  reached to over 20 group home. [facility] was interested. They faxed over his FL2.  Zelvia states that she will keep CM updated.    Expected Discharge Plan:  (patient is currently homeless, unsure of discharge location at this time) Barriers to Discharge: Financial Resources               Expected Discharge Plan and Services   Discharge Planning Services: CM Consult   Living arrangements for the past 2 months: Homeless Expected Discharge Date: 11/03/24                                     Social Drivers of Health (SDOH) Interventions SDOH Screenings   Food Insecurity: Food Insecurity Present (10/20/2024)  Housing: High Risk (10/21/2024)  Transportation Needs: Unmet Transportation Needs (10/20/2024)  Utilities: At Risk (10/20/2024)  Alcohol  Screen: Low Risk  (04/23/2024)  Social Connections: Unknown (04/23/2024)  Tobacco Use: High Risk (10/21/2024)    Readmission Risk Interventions     No data to display

## 2024-11-17 NOTE — Progress Notes (Signed)
 Progress Note   Patient: Corey House. FMW:[id number] DOB: May 03, 1966 DOA: 10/20/2024     28 DOS: the patient was seen and examined on 11/17/2024   Brief hospital course: 58 y.o. year old male with past medical history of  tobacco use disorder reporting a 41 year pack per year history, smoking ~1 pack per day since he was 58 years old. He denies any additional medical history though he has not seen a medical provider in ~20 years. He presents to [facility] ED with shortness of breath that began ~ 9 months ago and worsened ~3 months ago. He reports today his dyspnea is not significantly different than it was 3 months ago. He endorses a chronic cough that has been present since childhood. Her ports the cough is currently, minimally productive with white phlegm. Additionally, he endorses bilateral lower extremity swelling that began in the last 2 days, per his report (R) > (L). On exam the swelling is equal bilaterally. He endorses fatigue and dyspnea that is worse with position changes such as bending over and when standing from lying. He denies chest pain, palpitations, or fever.   ED Course: On arrival to [facility] ED patient was noted to be afebrile temp 36.8C, BP 142/111, HR 88, RR 22, SPO2 100% on 15L non-rebreather. Patient now on 2L via nasal cannula with SPO2 96%. CXR obtained and shows non specific findings of increased interstitial markings. CTA PE obtained and shows no PE, dilated RV and reflux of contract into the IVC and hepatic vein consistent with (R) heart failure, chronic bronchitis, and emphysema. US  DVT (B)LE negative for DVT. Labs notable Negative COVID, flu, RSV,for BNP 284.2, BUN 24, troponin 41--> 38 with no report of chest pain.  Lactic acid 0.9.  Blood cultures drawn and in process.  He was given Lasix , DuoNebs, and Solu-Medrol .  TRH contacted for admission.  11/5.  Pulse ox with ambulation down to 85%.  Patient admitted with COPD and CHF exacerbation.  Patient  also states that he is short of breath and he is waiting to die.  Appreciate psychiatric consultation.  Patient is homeless. 11/6.  Patient states that he has been sleepy.  Venous blood gas shows a pCO2 of 79 with a pH of 7.41.  Will start BiPAP at night.  Try to see if we can taper off oxygen. 11/7.  Patient hypoxic with walking around with pulmonary to 87%. 11/8.  Patient's pulse ox dropped down to 84% with ambulation. 11/9.  Patient had a pulse ox of 78% on room air at rest but he stated he walked back from the bathroom. 11/10.  Pulse ox in the low 80s with ambulation.  Likely will need chronic oxygen.  11/26: Patient now medically stable, continue to required oxygen-has become oxygen dependent, patient is homeless and cannot go to shelter with oxygen-TOC is working on placement  12/1: Still feeling depressed, increasing the dose of Lexapro  to 20 mg daily.  12/2: Still pending placement.  Exedrine added as Tylenol  was not helping with his headache and he was taking that at home.  Assessment and Plan: * Acute respiratory failure with hypoxia and hypercapnia (HCC) Pulse ox dropped to 82% with ambulation yesterday.  Awaiting pulse ox from today.  The patient is homeless will need to be able to come off oxygen prior to disposition.  Continue to try to see if we can get off oxygen on a daily basis.  VBG shows a pCO2 of 81.  Patient unable to  tolerate BiPAP.  The patient likely has acute on chronic respiratory failure.  - Continue to require oxygen, need oxygen for home and he is currently homeless, cannot go to a shelter with oxygen.  COPD with acute exacerbation (HCC) Pulmonary was on board. Completed a course of antibiotics and prednisone . - Continue with bronchodilator  Acute on chronic diastolic CHF (congestive heart failure) (HCC) EF 55 to 60%.  Initially on IV Lasix .  On 11/6 and gave a dose of acetazolamide  with CO2 elevation on chemistry.  Hold Lasix  today with CO2 on chemistry up at  38.  CT scan commenting on elevated right heart pressures.  No current signs of heart failure.  Tobacco use disorder Must stop smoking.  Headache Improved.  Tylenol  as needed.  IV magnesium  given on 11/5.  Increased anion gap metabolic acidosis Resolved  Hypernatremia Resolved  Depression Patient on Lexapro  and Seroquel  at night - Lexapro  dose was increased to 20 mg daily today  Protein-calorie malnutrition, severe Continue supplements      Subjective: Patient was complaining of headaches stating that Tylenol  does not help much.  He was taking Excedrin Migraine at home or Goody powder.  Physical Exam: Vitals:   11/16/24 2016 11/17/24 0427 11/17/24 0500 11/17/24 0809  BP: 111/86 106/77  107/79  Pulse: 83 73  71  Resp: 16 16  18   Temp: 98.3 F (36.8 C) 98 F (36.7 C)  97.9 F (36.6 C)  TempSrc: Oral Oral    SpO2: 97% 98%  99%  Weight:   54.9 kg   Height:       General.  Malnourished gentleman, in no acute distress. Pulmonary.  Lungs clear bilaterally, normal respiratory effort. CV.  Regular rate and rhythm, no JVD, rub or murmur. Abdomen.  Soft, nontender, nondistended, BS positive. CNS.  Alert and oriented .  No focal neurologic deficit. Extremities.  No edema, no cyanosis, pulses intact and symmetrical. Psychiatry.  Judgment and insight appears normal.   Data Reviewed: Prior data reviewed  Family Communication: Discussed with patient  Disposition: Status is: Inpatient Remains inpatient appropriate because: Unsafe discharge  Planned Discharge Destination: Likely group home  DVT prophylaxis.  Lovenox  Time spent: 40 minutes  This record has been created using Conservation officer, historic buildings. Errors have been sought and corrected,but may not always be located. Such creation errors do not reflect on the standard of care.   Author: Amaryllis Dare, MD 11/17/2024 4:19 PM  For on call review [email or web address].

## 2024-11-18 DIAGNOSIS — J9602 Acute respiratory failure with hypercapnia: Secondary | ICD-10-CM | POA: Diagnosis not present

## 2024-11-18 DIAGNOSIS — J9601 Acute respiratory failure with hypoxia: Secondary | ICD-10-CM | POA: Diagnosis not present

## 2024-11-18 MED ORDER — LORATADINE 10 MG PO TABS
10.0000 mg | ORAL_TABLET | Freq: Every day | ORAL | Status: AC | PRN
  Administered 2024-11-18 – 2025-01-15 (×5): 10 mg via ORAL
  Filled 2024-11-18 (×4): qty 1

## 2024-11-18 NOTE — Plan of Care (Signed)

## 2024-11-18 NOTE — Progress Notes (Signed)
 PROGRESS NOTE  Corey House. FMW:[id number] DOB: 10-05-1966 DOA: 10/20/2024 PCP: Patient, [name]   LOS: 29 days   Brief narrative:  58 y.o. year old male with past medical history of chronic smoking, not been to medical provider in more than 20 years presented to hospital with shortness of breath for several months, worsening over the last 2 to 3 months with chronic cough with mild production.  He also had bilateral lower extremity edema and dyspnea on exertion.  In the ED, patient was noted to be hypotensive tachypneic and hypoxic with needing 15 L of nonrebreather mask which was subsequently weaned down to 2 L.  Chest x-ray showed interstitial markings.  CT scan of the chest pulmonary embolism protocol without any pulmonary embolism but prominent diabetes see suggestive of chronic bronchitis emphysema.  US  DVT (B)LE negative for DVT. COVID, flu, RSV was negative. BNP 284.2, BUN 24, troponin 41--> 38 with no report of chest pain.  Lactic acid 0.9.  Blood cultures were drawn, patient was given Lasix  DuoNebs Solu-Medrol  and was admitted hospital for further evaluation and treatment.  He was given Lasix , DuoNebs, and Solu-Medrol  and was [facility].  Patient was briefly on BiPAP during hospitalization and currently has been weaned to nasal cannula oxygen.  At this time disposition plan is pending due to being homeless and need for oxygen.  Assessment/Plan: Principal Problem:   Acute respiratory failure with hypoxia and hypercapnia (HCC) Active Problems:   COPD with acute exacerbation (HCC)   Acute on chronic diastolic CHF (congestive heart failure) (HCC)   Tobacco use disorder   Headache   Increased anion gap metabolic acidosis   Hypernatremia   Protein-calorie malnutrition, severe   Depression  Acute respiratory failure with hypoxia and hypercapnia   Needing oxygen by nasal cannula.  Patient is homeless.  Unable to tolerate BiPAP.  Cannot go to shelter with oxygen.  Will  continue to wean oxygen as able.    COPD with acute exacerbation  Pulmonary followed the patient during hospitalization.  Completed course of antibiotics and prednisone .  Continue bronchodilators.   Acute on chronic diastolic CHF (congestive heart failure) (HCC) Review of last 2D echocardiogram with LV EF 55 to 60%.  Initially on IV Lasix .  On 11/6 and gave a dose of acetazolamide .  CT scan showing right heart elevated pressure.  Not on diuretics at this time   Tobacco use disorder Reinforced quitting smoking   Headache Improved.  As needed Tylenol .   Increased anion gap metabolic acidosis Resolved.  No recent BMP   Hypernatremia Resolved.  No recent BMP.   Depression Lexapro  and Seroquel .  Lexapro  dose was increased to 20 mg since yesterday.   Protein-calorie malnutrition, severe Body mass index is 21.18 kg/m.  Nutrition Status: Nutrition Problem: Severe Malnutrition Etiology: social / environmental circumstances Signs/Symptoms: moderate fat depletion, severe fat depletion, moderate muscle depletion, severe muscle depletion, edema Interventions: Ensure Enlive (each supplement provides 350kcal and 20 grams of protein), MVI, Liberalize Diet Seen by dietitian and continue supplementation.   Homelessness.  TOC on board   DVT prophylaxis: enoxaparin  (LOVENOX ) injection 40 mg Start: 10/20/24 1800   Disposition: Uncertain at this time.  TOC on board  Status is: Inpatient Remains inpatient appropriate because: Awaiting safe disposition plan    Code Status:     Code Status: Full Code  Family Communication: None at bedside  Consultants: None  Procedures: None  Anti-infectives:  None currently   Subjective: Today, patient was seen  and examined at bedside.  Patient complains of cough.  Denies any nausea vomiting abdominal pain or dyspnea.  Has been eating okay.  Feels a little anxious.  Objective: Vitals:   11/18/24 0434 11/18/24 0846  BP: 108/85 107/71  Pulse:  76 76  Resp: 18 19  Temp: (!) 97.4 F (36.3 C) 97.7 F (36.5 C)  SpO2: 100% 92%    Intake/Output Summary (Last 24 hours) at 11/18/2024 1241 Last data filed at 11/18/2024 0900 Gross per 24 hour  Intake 480 ml  Output 1 ml  Net 479 ml   Filed Weights   11/16/24 0500 11/17/24 0500 11/18/24 0500  Weight: 54.7 kg 54.9 kg 56 kg   Body mass index is 21.18 kg/m.   Physical Exam: GENERAL: Patient is alert awake and oriented. Not in obvious distress.  On nasal cannula oxygen HENT: No scleral pallor or icterus. Pupils equally reactive to light. Oral mucosa is moist NECK: is supple, no gross swelling noted. CHEST: Diminished breath sounds bilaterally. CVS: S1 and S2 heard, no murmur. Regular rate and rhythm.  ABDOMEN: Soft, non-tender, bowel sounds are present. EXTREMITIES: No edema. CNS: Cranial nerves are intact. No focal motor deficits.  Mildly anxious SKIN: warm and dry without rashes.  Data Review: I have personally reviewed the following laboratory data and studies,  CBC: No results for input(s): WBC, NEUTROABS, HGB, HCT, MCV, PLT in the last 168 hours. Basic Metabolic Panel: No results for input(s): NA, K, CL, CO2, GLUCOSE, BUN, CREATININE, CALCIUM, MG, PHOS in the last 168 hours. Liver Function Tests: No results for input(s): AST, ALT, ALKPHOS, BILITOT, PROT, ALBUMIN in the last 168 hours. No results for input(s): LIPASE, AMYLASE in the last 168 hours. No results for input(s): AMMONIA in the last 168 hours. Cardiac Enzymes: No results for input(s): CKTOTAL, CKMB, CKMBINDEX, TROPONINI in the last 168 hours. BNP (last 3 results) Recent Labs    10/20/24 1103  BNP 284.2*    ProBNP (last 3 results) Recent Labs    10/29/24 0608  PROBNP <50.0    CBG: No results for input(s): GLUCAP in the last 168 hours. No results found for this or any previous visit (from the past 240 hours).   Studies: No results  found.    Ludell Zacarias, MD  [facility] 11/18/2024  If 7PM-7AM, please contact night-coverage

## 2024-11-18 NOTE — Progress Notes (Signed)
 Mobility Specialist - Progress Note   11/18/24 1500  Mobility  Activity Ambulated with assistance;Stood at bedside;Dangled on edge of bed  Level of Assistance Independent after set-up  Assistive Device None  Distance Ambulated (ft) 200 ft  Range of Motion/Exercises Active;All extremities  Activity Response Tolerated well  Mobility visit 1 Mobility  Mobility Specialist Start Time (ACUTE ONLY) 1506  Mobility Specialist Stop Time (ACUTE ONLY) 1534  Mobility Specialist Time Calculation (min) (ACUTE ONLY) 28 min   Pt was in the supine position in bed on O2 @ 2 L. Pt agreed to mobility. Pt is independent throughout activity. Pt ambulated well throughout activity. Pt O2 vitals were taken throughout activity. Pt did desat during mobility, but recovered within 30 sec above 89%. Pt did state that it happens during activity at times. After activity pt returned to the room in bed with needs in reach.  Clem Rodes Mobility Specialist 11/18/24, 3:52 PM

## 2024-11-18 NOTE — Progress Notes (Signed)
 Mobility Specialist - Progress Note   11/18/24 1700  Mobility  Activity Ambulated with assistance  Level of Assistance Independent after set-up  Assistive Device None  Distance Ambulated (ft) 100 ft  Range of Motion/Exercises Active;All extremities  Activity Response Tolerated well  Mobility visit 1 Mobility  Mobility Specialist Start Time (ACUTE ONLY) 1000  Mobility Specialist Stop Time (ACUTE ONLY) 1010  Mobility Specialist Time Calculation (min) (ACUTE ONLY) 10 min   Pt was supine in bed with the HOB elevated on O2 @ 2 L with the physician exiting the room upon entry. Pt agreed to mobility. Pt is able today to get to the EOB independently with no bed features. Pt is able to STS independently with no AD. Pt ambulated well. Pt states that he will go further later in the day after lunch. Pt O2 vitals were taken throughout activity as a precaution. Pt O2 levels dropped. To right @ 88%. Pt recovered immediately. After activity pt returned to the room back in bed with needs in reach.  Clem Rodes Mobility Specialist 11/18/24, 5:12 PM

## 2024-11-18 NOTE — Plan of Care (Signed)
  Problem: Education: Goal: Knowledge of General Education information will improve Description: Including pain rating scale, medication(s)/side effects and non-pharmacologic comfort measures Outcome: Progressing   Problem: Health Behavior/Discharge Planning: Goal: Ability to manage health-related needs will improve Outcome: Progressing   Problem: Clinical Measurements: Goal: Ability to maintain clinical measurements within normal limits will improve Outcome: Progressing Goal: Will remain free from infection Outcome: Progressing Goal: Diagnostic test results will improve Outcome: Progressing Goal: Respiratory complications will improve Outcome: Progressing Goal: Cardiovascular complication will be avoided Outcome: Progressing   Problem: Activity: Goal: Risk for activity intolerance will decrease Outcome: Progressing   Problem: Nutrition: Goal: Adequate nutrition will be maintained Outcome: Progressing   Problem: Coping: Goal: Level of anxiety will decrease Outcome: Progressing   Problem: Elimination: Goal: Will not experience complications related to bowel motility Outcome: Progressing Goal: Will not experience complications related to urinary retention Outcome: Progressing   Problem: Pain Managment: Goal: General experience of comfort will improve and/or be controlled Outcome: Progressing   Problem: Skin Integrity: Goal: Risk for impaired skin integrity will decrease Outcome: Progressing   Problem: Education: Goal: Knowledge of disease or condition will improve Outcome: Progressing Goal: Knowledge of the prescribed therapeutic regimen will improve Outcome: Progressing Goal: Individualized Educational Video(s) Outcome: Progressing   Problem: Activity: Goal: Ability to tolerate increased activity will improve Outcome: Progressing Goal: Will verbalize the importance of balancing activity with adequate rest periods Outcome: Progressing   Problem:  Respiratory: Goal: Ability to maintain a clear airway will improve Outcome: Progressing Goal: Levels of oxygenation will improve Outcome: Progressing Goal: Ability to maintain adequate ventilation will improve Outcome: Progressing

## 2024-11-19 DIAGNOSIS — J9602 Acute respiratory failure with hypercapnia: Secondary | ICD-10-CM | POA: Diagnosis not present

## 2024-11-19 DIAGNOSIS — J9601 Acute respiratory failure with hypoxia: Secondary | ICD-10-CM | POA: Diagnosis not present

## 2024-11-19 LAB — BASIC METABOLIC PANEL WITH GFR
Anion gap: 7 (ref 5–15)
BUN: 18 mg/dL (ref 6–20)
CO2: 32 mmol/L (ref 22–32)
Calcium: 8.9 mg/dL (ref 8.9–10.3)
Chloride: 100 mmol/L (ref 98–111)
Creatinine, Ser: 0.71 mg/dL (ref 0.61–1.24)
GFR, Estimated: >60 mL/min (ref >60)
Glucose, Bld: 103 mg/dL — ABNORMAL HIGH (ref 70–99)
Potassium: 4.1 mmol/L (ref 3.5–5.1)
Sodium: 139 mmol/L (ref 135–145)

## 2024-11-19 LAB — CBC
HCT: 39.2 % (ref 39.0–52.0)
Hemoglobin: 12.9 g/dL — ABNORMAL LOW (ref 13.0–17.0)
MCH: 30 pg (ref 26.0–34.0)
MCHC: 32.9 g/dL (ref 30.0–36.0)
MCV: 91.2 fL (ref 80.0–100.0)
Platelets: 230 K/uL (ref 150–400)
RBC: 4.3 MIL/uL (ref 4.22–5.81)
RDW: 12.5 % (ref 11.5–15.5)
WBC: 5.1 K/uL (ref 4.0–10.5)
nRBC: 0 % (ref 0.0–0.2)

## 2024-11-19 LAB — MAGNESIUM: Magnesium: 1.9 mg/dL (ref 1.7–2.4)

## 2024-11-19 NOTE — Progress Notes (Signed)
 SATURATION QUALIFICATIONS: (This note is used to comply with regulatory documentation for home oxygen)  Patient Saturations on Room Air at Rest = 89%  Patient Saturations on Room Air while Ambulating = 86%  Patient Saturations on 4L Liters of oxygen while Ambulating = 92%

## 2024-11-19 NOTE — TOC Progression Note (Addendum)
 Transition of Care (TOC) - Progression Note    Patient Details  Name: Corey House. MRN: [id number] Date of Birth: 07/20/1966  Transition of [facility]) CM/SW Contact  Delphine KANDICE Bring, RN Phone Number: 11/19/2024, 9:45 AM  Clinical Narrative:     CM called Zelvia at [facility].  CM asked about updated on [facility]. She states that they have emailed and called the facility to make sure they have received the clinical information. She states at this time they have not responded. She states that Care Management extender started looking for other facilities. CM asked to be added to the email chain to the potential facilities. Zelvia forward CM the email sent to [facility]  CM called [name] [phone] but had to leave a message. She is working on his disability application.   1100: CM received a call back from [employer]. She states that she is still working on his SSI disability application. She states her last piece of the application is his medical summary that nee to be submitted with the application. She states that she has his medical records.   Expected Discharge Plan:  (patient is currently homeless, unsure of discharge location at this time) Barriers to Discharge: Financial Resources               Expected Discharge Plan and Services   Discharge Planning Services: CM Consult   Living arrangements for the past 2 months: Homeless Expected Discharge Date: 11/03/24                                     Social Drivers of Health (SDOH) Interventions SDOH Screenings   Food Insecurity: Food Insecurity Present (10/20/2024)  Housing: High Risk (10/21/2024)  Transportation Needs: Unmet Transportation Needs (10/20/2024)  Utilities: At Risk (10/20/2024)  Alcohol  Screen: Low Risk  (04/23/2024)  Social Connections: Unknown (04/23/2024)  Tobacco Use: High Risk (10/21/2024)    Readmission Risk Interventions     No data to display

## 2024-11-19 NOTE — Plan of Care (Signed)
  Problem: Education: Goal: Knowledge of General Education information will improve Description: Including pain rating scale, medication(s)/side effects and non-pharmacologic comfort measures Outcome: Progressing   Problem: Health Behavior/Discharge Planning: Goal: Ability to manage health-related needs will improve Outcome: Progressing   Problem: Clinical Measurements: Goal: Ability to maintain clinical measurements within normal limits will improve Outcome: Progressing Goal: Will remain free from infection Outcome: Progressing Goal: Diagnostic test results will improve Outcome: Progressing Goal: Respiratory complications will improve Outcome: Progressing Goal: Cardiovascular complication will be avoided Outcome: Progressing   Problem: Activity: Goal: Risk for activity intolerance will decrease Outcome: Progressing   Problem: Nutrition: Goal: Adequate nutrition will be maintained Outcome: Progressing   Problem: Coping: Goal: Level of anxiety will decrease Outcome: Progressing   Problem: Elimination: Goal: Will not experience complications related to bowel motility Outcome: Progressing Goal: Will not experience complications related to urinary retention Outcome: Progressing   Problem: Pain Managment: Goal: General experience of comfort will improve and/or be controlled Outcome: Progressing   Problem: Safety: Goal: Ability to remain free from injury will improve Outcome: Progressing   Problem: Skin Integrity: Goal: Risk for impaired skin integrity will decrease Outcome: Progressing   Problem: Activity: Goal: Ability to tolerate increased activity will improve Outcome: Progressing Goal: Will verbalize the importance of balancing activity with adequate rest periods Outcome: Progressing   Problem: Respiratory: Goal: Ability to maintain a clear airway will improve Outcome: Progressing Goal: Levels of oxygenation will improve Outcome: Progressing Goal: Ability  to maintain adequate ventilation will improve Outcome: Progressing

## 2024-11-19 NOTE — Progress Notes (Addendum)
 PROGRESS NOTE  Corey House. FMW:[id number] DOB: January 12, 1966 DOA: 10/20/2024 PCP: Patient, [name]   LOS: 30 days   Brief narrative:  58 y.o. year old male with past medical history of chronic smoking, not been to medical provider in more than 20 years presented to hospital with shortness of breath for several months, worsening over the last 2 to 3 months with chronic cough with mild sputum production.  He also had bilateral lower extremity edema and dyspnea on exertion.  In the ED, patient was noted to be hypotensive, tachypneic and hypoxic needing 15 L of nonrebreather mask which was subsequently weaned down to 2 L.  Chest x-ray showed interstitial markings.  CT scan of the chest pulmonary embolism protocol without any pulmonary embolism but prominent interstitial markings/emphysema.  US  DVT (B)LE negative for DVT. COVID, flu, RSV was negative. BNP 284.2, BUN 24, troponin 41--> 38 with no report of chest pain.  Lactic acid 0.9.  Blood cultures were drawn, patient was given Lasix  DuoNebs Solu-Medrol  and was admitted hospital.  Patient was briefly on BiPAP during hospitalization and currently has been weaned to nasal cannula oxygen.  At this time disposition plan is pending due to being homeless and need for oxygen.  Assessment/Plan: Principal Problem:   Acute respiratory failure with hypoxia and hypercapnia (HCC) Active Problems:   COPD with acute exacerbation (HCC)   Acute on chronic diastolic CHF (congestive heart failure) (HCC)   Tobacco use disorder   Headache   Increased anion gap metabolic acidosis   Hypernatremia   Protein-calorie malnutrition, severe   Depression  Acute respiratory failure with hypoxia and hypercapnia  Needing oxygen by nasal cannula.  Patient is homeless.  Unable to tolerate BiPAP.  Cannot go to shelter with oxygen.  Will continue to wean oxygen as able.  Will reach out to the nursing staff again to see if we can attempt to wean down on oxygen    COPD  with acute exacerbation  Pulmonary followed the patient during hospitalization.  Completed course of antibiotics and prednisone .  Continue bronchodilators.  Feels okay at this time.   Acute on chronic diastolic CHF (congestive heart failure) (HCC) Review of last 2D echocardiogram with LV EF 55 to 60%.  Initially on IV Lasix .  On 11/6 and gave a dose of acetazolamide .  CT scan showing right heart elevated pressure.  Not on diuretics at this time   Tobacco use disorder Reinforced quitting smoking   Headache Improved.  As needed Tylenol .   Increased anion gap metabolic acidosis Resolved.  No recent BMP   Hypernatremia Resolved.  Last sodium level of 139.   Depression Continue Lexapro  and Seroquel .  Lexapro  dose was increased to 20 mg since 11-17-24.   Protein-calorie malnutrition, severe Body mass index is 21.18 kg/m.  Nutrition Status: Nutrition Problem: Severe Malnutrition Etiology: social / environmental circumstances Signs/Symptoms: moderate fat depletion, severe fat depletion, moderate muscle depletion, severe muscle depletion, edema Interventions: Ensure Enlive (each supplement provides 350kcal and 20 grams of protein), MVI, Liberalize Diet Seen by dietitian and continue supplementation.   Homelessness.  TOC on board   DVT prophylaxis: enoxaparin  (LOVENOX ) injection 40 mg Start: 10/20/24 1800   Disposition: Uncertain at this time.   Barriers include needing supplemental oxygen with homelessness,TOC on board  Status is: Inpatient Remains inpatient appropriate because: Awaiting safe disposition plan,    Code Status:     Code Status: Full Code  Family Communication: None at bedside  Consultants: None  Procedures: None  Anti-infectives:  None currently   Subjective: Today, patient was seen and examined at bedside.  Patient denies interval complaints.  Denies any chest pain, nausea, vomiting, dyspnea or shortness of breath.  Objective: Vitals:   11/19/24  0409 11/19/24 0800  BP: 99/78 123/79  Pulse: 81 82  Resp: 18 17  Temp: 98.1 F (36.7 C) 98.9 F (37.2 C)  SpO2: 95% 92%    Intake/Output Summary (Last 24 hours) at 11/19/2024 1024 Last data filed at 11/18/2024 1700 Gross per 24 hour  Intake 720 ml  Output --  Net 720 ml   Filed Weights   11/17/24 0500 11/18/24 0500 11/19/24 0336  Weight: 54.9 kg 56 kg 57.6 kg   Body mass index is 21.79 kg/m.   Physical Exam: GENERAL: Patient is alert awake and oriented. Not in obvious distress.  On nasal cannula oxygen HENT: No scleral pallor or icterus. Pupils equally reactive to light. Oral mucosa is moist NECK: is supple, no gross swelling noted. CHEST: Diminished breath sounds bilaterally.  Denies any wheezing or crackles. CVS: S1 and S2 heard, no murmur. Regular rate and rhythm.  ABDOMEN: Soft, non-tender, bowel sounds are present. EXTREMITIES: No edema. CNS: Cranial nerves are intact. No focal motor deficits.   SKIN: warm and dry without rashes.  Data Review: I have personally reviewed the following laboratory data and studies,  CBC: Recent Labs  Lab 11/19/24 0531  WBC 5.1  HGB 12.9*  HCT 39.2  MCV 91.2  PLT 230   Basic Metabolic Panel: Recent Labs  Lab 11/19/24 0531  NA 139  K 4.1  CL 100  CO2 32  GLUCOSE 103*  BUN 18  CREATININE 0.71  CALCIUM 8.9  MG 1.9   Liver Function Tests: No results for input(s): AST, ALT, ALKPHOS, BILITOT, PROT, ALBUMIN in the last 168 hours. No results for input(s): LIPASE, AMYLASE in the last 168 hours. No results for input(s): AMMONIA in the last 168 hours. Cardiac Enzymes: No results for input(s): CKTOTAL, CKMB, CKMBINDEX, TROPONINI in the last 168 hours. BNP (last 3 results) Recent Labs    10/20/24 1103  BNP 284.2*    ProBNP (last 3 results) Recent Labs    10/29/24 0608  PROBNP <50.0    CBG: No results for input(s): GLUCAP in the last 168 hours. No results found for this or any previous  visit (from the past 240 hours).   Studies: No results found.    Jerzee Jerome, MD  [facility] 11/19/2024  If 7PM-7AM, please contact night-coverage

## 2024-11-19 NOTE — Plan of Care (Signed)
  Problem: Education: Goal: Knowledge of General Education information will improve Description: Including pain rating scale, medication(s)/side effects and non-pharmacologic comfort measures Outcome: Progressing   Problem: Health Behavior/Discharge Planning: Goal: Ability to manage health-related needs will improve Outcome: Progressing   Problem: Clinical Measurements: Goal: Ability to maintain clinical measurements within normal limits will improve Outcome: Progressing Goal: Will remain free from infection Outcome: Progressing Goal: Diagnostic test results will improve Outcome: Progressing Goal: Respiratory complications will improve Outcome: Progressing Goal: Cardiovascular complication will be avoided Outcome: Progressing   Problem: Activity: Goal: Risk for activity intolerance will decrease Outcome: Progressing   Problem: Nutrition: Goal: Adequate nutrition will be maintained Outcome: Progressing   Problem: Coping: Goal: Level of anxiety will decrease Outcome: Progressing   Problem: Pain Managment: Goal: General experience of comfort will improve and/or be controlled Outcome: Progressing   Problem: Safety: Goal: Ability to remain free from injury will improve Outcome: Progressing   Problem: Skin Integrity: Goal: Risk for impaired skin integrity will decrease Outcome: Progressing   Problem: Activity: Goal: Ability to tolerate increased activity will improve Outcome: Progressing Goal: Will verbalize the importance of balancing activity with adequate rest periods Outcome: Progressing   Problem: Respiratory: Goal: Ability to maintain a clear airway will improve Outcome: Progressing Goal: Levels of oxygenation will improve Outcome: Progressing Goal: Ability to maintain adequate ventilation will improve Outcome: Progressing

## 2024-11-20 DIAGNOSIS — J9601 Acute respiratory failure with hypoxia: Secondary | ICD-10-CM | POA: Diagnosis not present

## 2024-11-20 DIAGNOSIS — J9602 Acute respiratory failure with hypercapnia: Secondary | ICD-10-CM | POA: Diagnosis not present

## 2024-11-20 MED ORDER — SALINE SPRAY 0.65 % NA SOLN
1.0000 | NASAL | Status: AC | PRN
  Filled 2024-11-20: qty 44

## 2024-11-20 NOTE — Plan of Care (Signed)

## 2024-11-20 NOTE — Progress Notes (Addendum)
 PROGRESS NOTE  Corey House. FMW:[id number] DOB: 01-Aug-1966 DOA: 10/20/2024 PCP: Patient, [name]   LOS: 31 days   Brief narrative:  58 y.o. year old male with past medical history of chronic smoking, not been to medical provider in more than 20 years presented to hospital with shortness of breath for several months, worsening over the last 2 to 3 months with chronic cough with mild sputum production.  He also had bilateral lower extremity edema and dyspnea on exertion.  In the ED, patient was noted to be hypotensive, tachypneic and hypoxic needing 15 L of nonrebreather mask which was subsequently weaned down to 2 L.  Chest x-ray showed interstitial markings.  CT scan of the chest pulmonary embolism protocol without any pulmonary embolism but prominent interstitial markings/emphysema.  US  DVT (B)LE negative for DVT. COVID, flu, RSV was negative. BNP 284.2, BUN 24, troponin 41--> 38 with no report of chest pain.  Lactic acid 0.9.  Blood cultures were drawn, patient was given Lasix  DuoNebs Solu-Medrol  and was admitted hospital.  Patient was briefly on BiPAP during hospitalization and currently has been weaned to nasal cannula oxygen.  At this time disposition plan is pending due to being homeless and need for oxygen.  Assessment/Plan:  Acute respiratory failure with hypoxia and hypercapnia  --Needing oxygen by nasal cannula.  Patient is homeless.  Unable to tolerate BiPAP.  Cannot go to shelter with oxygen. --as off 11/19/2024--RN unable to wean oxygen--desats in the 80s    COPD with acute exacerbation  Pulmonary followed the patient during hospitalization.  Completed course of antibiotics and prednisone .  Continue bronchodilators.     Acute on chronic diastolic CHF (congestive heart failure) (HCC) --Review of last 2D echocardiogram with LV EF 55 to 60%.  Initially on IV Lasix .  --- On 11/6 and gave a dose of acetazolamide .   --CT scan showing right heart elevated pressure.  Not on  diuretics at this time   Tobacco use disorder --Reinforced quitting smoking    Increased anion gap metabolic acidosis --Resolved.    Hypernatremia --Resolved.  Last sodium level of 139.   Depression --Continue Lexapro  and Seroquel .  Lexapro  dose was increased to 20 mg since 11-17-24.   Protein-calorie malnutrition, severe Body mass index is 21.18 kg/m.  Nutrition Status: Nutrition Problem: Severe Malnutrition Etiology: social / environmental circumstances Signs/Symptoms: moderate fat depletion, severe fat depletion, moderate muscle depletion, severe muscle depletion, edema Interventions: Ensure Enlive (each supplement provides 350kcal and 20 grams of protein), MVI, Liberalize Diet Seen by dietitian and continue supplementation.   Homelessness.  TOC on board   DVT prophylaxis: enoxaparin  (LOVENOX ) injection 40 mg Start: 10/20/24 1800   Disposition: Uncertain at this time.   Barriers include needing supplemental oxygen with homelessness,TOC on board  Status is: Inpatient Remains inpatient appropriate because: Awaiting safe disposition plan,    Code Status:     Code Status: Full Code  Family Communication: None at bedside   Subjective: Today, patient was seen and examined at bedside.  Patient denies interval complaints.   Objective: Vitals:   11/20/24 0434 11/20/24 0828  BP: 118/74 106/76  Pulse: 78 78  Resp: 18 18  Temp: 98.3 F (36.8 C) 98.6 F (37 C)  SpO2: 96% 96%    Intake/Output Summary (Last 24 hours) at 11/20/2024 1526 Last data filed at 11/20/2024 1300 Gross per 24 hour  Intake 1440 ml  Output --  Net 1440 ml   Filed Weights   11/18/24 0500 11/19/24 0336  11/20/24 0500  Weight: 56 kg 57.6 kg 57.6 kg   Body mass index is 21.79 kg/m.   Physical Exam: GENERAL: Patient is alert awake and oriented. Not in obvious distress.  On nasal cannula oxygen CHEST: Diminished breath sounds bilaterally.  Denies any wheezing or crackles. CVS: S1 and S2 heard,  no murmur. Regular rate and rhythm.  ABDOMEN: Soft, non-tender, bowel sounds are present. EXTREMITIES: No edema. CNS: Cranial nerves are intact. No focal motor deficits.   SKIN: warm and dry without rashes.    CBC: Recent Labs  Lab 11/19/24 0531  WBC 5.1  HGB 12.9*  HCT 39.2  MCV 91.2  PLT 230   Basic Metabolic Panel: Recent Labs  Lab 11/19/24 0531  NA 139  K 4.1  CL 100  CO2 32  GLUCOSE 103*  BUN 18  CREATININE 0.71  CALCIUM 8.9  MG 1.9    Leita Blanch, MD  [facility] 11/20/2024

## 2024-11-21 DIAGNOSIS — J9601 Acute respiratory failure with hypoxia: Secondary | ICD-10-CM | POA: Diagnosis not present

## 2024-11-21 DIAGNOSIS — J9602 Acute respiratory failure with hypercapnia: Secondary | ICD-10-CM | POA: Diagnosis not present

## 2024-11-21 NOTE — Progress Notes (Signed)
 PROGRESS NOTE  Corey House. FMW:[id number] DOB: 04-17-1966 DOA: 10/20/2024 PCP: Patient, [name]   LOS: 32 days   Brief narrative:  58 y.o. year old male with past medical history of chronic smoking, not been to medical provider in more than 20 years presented to hospital with shortness of breath for several months, worsening over the last 2 to 3 months with chronic cough with mild sputum production.  He also had bilateral lower extremity edema and dyspnea on exertion.  In the ED, patient was noted to be hypotensive, tachypneic and hypoxic needing 15 L of nonrebreather mask which was subsequently weaned down to 2 L.  Chest x-ray showed interstitial markings.  CT scan of the chest pulmonary embolism protocol without any pulmonary embolism but prominent interstitial markings/emphysema.  US  DVT (B)LE negative for DVT. COVID, flu, RSV was negative. BNP 284.2, BUN 24, troponin 41--> 38 with no report of chest pain.  Lactic acid 0.9.  Blood cultures were drawn, patient was given Lasix  DuoNebs Solu-Medrol  and was admitted hospital.  Patient was briefly on BiPAP during hospitalization and currently has been weaned to nasal cannula oxygen.  At this time disposition plan is pending due to being homeless and need for oxygen.  Assessment/Plan:  Acute respiratory failure with hypoxia and hypercapnia  --Needing oxygen by nasal cannula.  Patient is homeless.  Unable to tolerate BiPAP.  Cannot go to shelter with oxygen. --as off 11/19/2024--RN unable to wean oxygen--desats in the 80s    COPD with acute exacerbation  Pulmonary followed the patient during hospitalization.  Completed course of antibiotics and prednisone .  Continue bronchodilators.     Acute on chronic diastolic CHF (congestive heart failure) (HCC) --Review of last 2D echocardiogram with LV EF 55 to 60%.  Initially on IV Lasix .  --- On 11/6 and gave a dose of acetazolamide .   --CT scan showing right heart elevated pressure.  Not on  diuretics at this time   Tobacco use disorder --Reinforced quitting smoking    Increased anion gap metabolic acidosis --Resolved.    Hypernatremia --Resolved.  Last sodium level of 139.   Depression --Continue Lexapro  and Seroquel .  Lexapro  dose was increased to 20 mg since 11-17-24.   Protein-calorie malnutrition, severe Body mass index is 21.18 kg/m.  Nutrition Status: Nutrition Problem: Severe Malnutrition Etiology: social / environmental circumstances Signs/Symptoms: moderate fat depletion, severe fat depletion, moderate muscle depletion, severe muscle depletion, edema Interventions: Ensure Enlive (each supplement provides 350kcal and 20 grams of protein), MVI, Liberalize Diet Seen by dietitian and continue supplementation.   Homelessness.  TOC on board   DVT prophylaxis: enoxaparin  (LOVENOX ) injection 40 mg Start: 10/20/24 1800   Disposition: Uncertain at this time.   Barriers include needing supplemental oxygen with homelessness,TOC on board  Status is: Inpatient Remains inpatient appropriate because: Awaiting safe disposition plan,    Code Status:     Code Status: Full Code  Family Communication: None at bedside   Subjective: Today, patient was seen and examined at bedside.  Patient denies interval complaints.   Objective: Vitals:   11/21/24 0855 11/21/24 0930  BP: 110/79   Pulse: 77   Resp: 16   Temp: (!) 97.5 F (36.4 C)   SpO2: 97% 93%    Intake/Output Summary (Last 24 hours) at 11/21/2024 1150 Last data filed at 11/21/2024 0900 Gross per 24 hour  Intake 960 ml  Output --  Net 960 ml   Filed Weights   11/18/24 0500 11/19/24 0336 11/20/24 0500  Weight: 56 kg 57.6 kg 57.6 kg   Body mass index is 21.79 kg/m.   Physical Exam: GENERAL: Patient is alert awake and oriented. Not in obvious distress.  On nasal cannula oxygen CHEST: Diminished breath sounds bilaterally.  Denies any wheezing or crackles. CVS: S1 and S2 heard, no murmur. Regular rate  and rhythm.  ABDOMEN: Soft, non-tender, bowel sounds are present. EXTREMITIES: No edema. CNS: Cranial nerves are intact. No focal motor deficits.   SKIN: warm and dry without rashes.    CBC: Recent Labs  Lab 11/19/24 0531  WBC 5.1  HGB 12.9*  HCT 39.2  MCV 91.2  PLT 230   Basic Metabolic Panel: Recent Labs  Lab 11/19/24 0531  NA 139  K 4.1  CL 100  CO2 32  GLUCOSE 103*  BUN 18  CREATININE 0.71  CALCIUM 8.9  MG 1.9    Leita Blanch, MD  [facility] 11/21/2024

## 2024-11-22 NOTE — Plan of Care (Signed)
   Problem: Clinical Measurements: Goal: Ability to maintain clinical measurements within normal limits will improve Outcome: Progressing Goal: Will remain free from infection Outcome: Progressing Goal: Respiratory complications will improve Outcome: Progressing

## 2024-11-22 NOTE — Progress Notes (Signed)
 PROGRESS NOTE    Corey House.  FMW:[id number] DOB: 1966/08/05 DOA: 10/20/2024 PCP: Patient, [name]   Assessment & Plan:   Principal Problem:   Acute respiratory failure with hypoxia and hypercapnia (HCC) Active Problems:   COPD with acute exacerbation (HCC)   Acute on chronic diastolic CHF (congestive heart failure) (HCC)   Tobacco use disorder   Headache   Increased anion gap metabolic acidosis   Hypernatremia   Protein-calorie malnutrition, severe   Depression  Assessment and Plan: Acute hypoxic& hypercapnic respiratory failure: continue on supplemental oxygen and wean as tolerated. Bronchodilators prn & encourage incentive spirometry.  Unable to tolerate BiPAP.  Pt is homeless. Cannot go to shelter with oxygen.  COPD exacerbation: completed abx and steroid course. Continue on bronchodilators and encourage incentive spirometry     Acute on chronic diastolic CHF: echo showed EF 55-60%. Monitor I/Os. Holding diuretics    Tobacco use disorder: received smoking cessation counseling already     Anion gap metabolic acidosis: resolved   Hypernatremia: resolved   Depression: severity unknown. Continue on seroquel  & increased dose of lexapro       DVT prophylaxis: lovenox   Code Status: ful  Family Communication:  Disposition Plan: medically stable. Waiting on safe d/c plan   Level of care: Telemetry  Status is: Inpatient Remains inpatient appropriate because: medically stable. Waiting on safe d/c plan     Consultants:    Procedures:   Antimicrobials:   Subjective: Pt c/o fatigue   Objective: Vitals:   11/21/24 0930 11/21/24 1604 11/21/24 2041 11/22/24 0432  BP:  107/83 109/72 107/74  Pulse:  87 86 77  Resp:  18 16 16   Temp:  98.3 F (36.8 C) 98.5 F (36.9 C) (!) 97.3 F (36.3 C)  TempSrc:  Oral    SpO2: 93% 94% 95% 94%  Weight:      Height:        Intake/Output Summary (Last 24 hours) at 11/22/2024 0852 Last data filed at 11/21/2024  1900 Gross per 24 hour  Intake 720 ml  Output --  Net 720 ml   Filed Weights   11/18/24 0500 11/19/24 0336 11/20/24 0500  Weight: 56 kg 57.6 kg 57.6 kg    Examination:  General exam: Appears calm and comfortable  Respiratory system: decreased breath sounds b/l Cardiovascular system: S1/S2+. No rubs or clicks  Gastrointestinal system: abd is soft, NT, ND & hypoactive bowel sounds Central nervous system: Alert and oriented. Moves all extremities  Psychiatry: Judgement and insight appear normal. Flat mood and affect    Data Reviewed: I have personally reviewed following labs and imaging studies  CBC: Recent Labs  Lab 11/19/24 0531  WBC 5.1  HGB 12.9*  HCT 39.2  MCV 91.2  PLT 230   Basic Metabolic Panel: Recent Labs  Lab 11/19/24 0531  NA 139  K 4.1  CL 100  CO2 32  GLUCOSE 103*  BUN 18  CREATININE 0.71  CALCIUM 8.9  MG 1.9   GFR: Estimated Creatinine Clearance: 84 mL/min (by C-G formula based on SCr of 0.71 mg/dL). Liver Function Tests: No results for input(s): AST, ALT, ALKPHOS, BILITOT, PROT, ALBUMIN in the last 168 hours. No results for input(s): LIPASE, AMYLASE in the last 168 hours. No results for input(s): AMMONIA in the last 168 hours. Coagulation Profile: No results for input(s): INR, PROTIME in the last 168 hours. Cardiac Enzymes: No results for input(s): CKTOTAL, CKMB, CKMBINDEX, TROPONINI in the last 168 hours. BNP (last 3  results) Recent Labs    10/29/24 0608  PROBNP <50.0   HbA1C: No results for input(s): HGBA1C in the last 72 hours. CBG: No results for input(s): GLUCAP in the last 168 hours. Lipid Profile: No results for input(s): CHOL, HDL, LDLCALC, TRIG, CHOLHDL, LDLDIRECT in the last 72 hours. Thyroid Function Tests: No results for input(s): TSH, T4TOTAL, FREET4, T3FREE, THYROIDAB in the last 72 hours. Anemia Panel: No results for input(s): VITAMINB12, FOLATE, FERRITIN,  TIBC, IRON, RETICCTPCT in the last 72 hours. Sepsis Labs: No results for input(s): PROCALCITON, LATICACIDVEN in the last 168 hours.  No results found for this or any previous visit (from the past 240 hours).       Radiology Studies: No results found.      Scheduled Meds:  enoxaparin  (LOVENOX ) injection  40 mg Subcutaneous Q24H   escitalopram   20 mg Oral Daily   guaiFENesin   600 mg Oral BID   multivitamin with minerals  1 tablet Oral Daily   nicotine   14 mg Transdermal Daily   pantoprazole   40 mg Oral Daily   QUEtiapine   25 mg Oral QHS   umeclidinium-vilanterol  1 puff Inhalation Daily   Continuous Infusions:   LOS: 33 days    Corey CHRISTELLA Pouch, MD [facility] Pager [phone]  If 7PM-7AM, please contact night-coverage [email or web address] Password [email or web address] 11/22/2024, 8:52 AM

## 2024-11-23 NOTE — Progress Notes (Signed)
 PROGRESS NOTE    Corey House.  FMW:[id number] DOB: 12/26/1965 DOA: 10/20/2024 PCP: Patient, [name]   Assessment & Plan:   Principal Problem:   Acute respiratory failure with hypoxia and hypercapnia (HCC) Active Problems:   COPD with acute exacerbation (HCC)   Acute on chronic diastolic CHF (congestive heart failure) (HCC)   Tobacco use disorder   Headache   Increased anion gap metabolic acidosis   Hypernatremia   Protein-calorie malnutrition, severe   Depression  Assessment and Plan: Acute hypoxic& hypercapnic respiratory failure: continue on supplemental oxygen and wean as tolerated. Bronchodilators prn & encourage incentive spirometry.  Unable to tolerate BiPAP.  Pt is homeless. Cannot go to shelter with oxygen.  COPD exacerbation: completed abx and steroid course. Continue bronchodilators & encourage incentive spirometry      Acute on chronic diastolic CHF: echo showed EF 55-60%. Monitor I/Os. Holding diuretics     Tobacco use disorder: received smoking cessation counseling already     Anion gap metabolic acidosis: resolved   Hypernatremia: resolved   Depression: severity unknown. Continue on seroquel  & increased dose of lexapro        DVT prophylaxis: lovenox   Code Status: ful  Family Communication:  Disposition Plan: medically stable. Waiting on safe d/c plan   Level of care: Telemetry  Status is: Inpatient Remains inpatient appropriate because: medically stable. Waiting on safe d/c plan     Consultants:    Procedures:   Antimicrobials:   Subjective: Pt c/o malaise  Objective: Vitals:   11/22/24 1739 11/22/24 2140 11/23/24 0419 11/23/24 0839  BP: 110/76 118/79 108/79 108/78  Pulse: 79 77 76 79  Resp: 17   18  Temp: 98.5 F (36.9 C) (!) 97.5 F (36.4 C) (!) 97.4 F (36.3 C) 98 F (36.7 C)  TempSrc:  Oral Oral Oral  SpO2: 94% 95% 97% 94%  Weight:   56.5 kg   Height:        Intake/Output Summary (Last 24 hours) at 11/23/2024  0921 Last data filed at 11/22/2024 1300 Gross per 24 hour  Intake 240 ml  Output --  Net 240 ml   Filed Weights   11/19/24 0336 11/20/24 0500 11/23/24 0419  Weight: 57.6 kg 57.6 kg 56.5 kg    Examination:  General exam: Appears comfortable Respiratory system: diminished breath sounds b/l  Cardiovascular system: S1 & S2+. No rubs or clicks  Gastrointestinal system: abd is soft, NT, ND & hypoactive bowel sounds Central nervous system: alert & oriented. Moves all extremities  Psychiatry: Judgement and insight appears at baseline. Flat mood and affect    Data Reviewed: I have personally reviewed following labs and imaging studies  CBC: Recent Labs  Lab 11/19/24 0531  WBC 5.1  HGB 12.9*  HCT 39.2  MCV 91.2  PLT 230   Basic Metabolic Panel: Recent Labs  Lab 11/19/24 0531  NA 139  K 4.1  CL 100  CO2 32  GLUCOSE 103*  BUN 18  CREATININE 0.71  CALCIUM 8.9  MG 1.9   GFR: Estimated Creatinine Clearance: 82.4 mL/min (by C-G formula based on SCr of 0.71 mg/dL). Liver Function Tests: No results for input(s): AST, ALT, ALKPHOS, BILITOT, PROT, ALBUMIN in the last 168 hours. No results for input(s): LIPASE, AMYLASE in the last 168 hours. No results for input(s): AMMONIA in the last 168 hours. Coagulation Profile: No results for input(s): INR, PROTIME in the last 168 hours. Cardiac Enzymes: No results for input(s): CKTOTAL, CKMB, CKMBINDEX, TROPONINI in  the last 168 hours. BNP (last 3 results) Recent Labs    10/29/24 0608  PROBNP <50.0   HbA1C: No results for input(s): HGBA1C in the last 72 hours. CBG: No results for input(s): GLUCAP in the last 168 hours. Lipid Profile: No results for input(s): CHOL, HDL, LDLCALC, TRIG, CHOLHDL, LDLDIRECT in the last 72 hours. Thyroid Function Tests: No results for input(s): TSH, T4TOTAL, FREET4, T3FREE, THYROIDAB in the last 72 hours. Anemia Panel: No results for  input(s): VITAMINB12, FOLATE, FERRITIN, TIBC, IRON, RETICCTPCT in the last 72 hours. Sepsis Labs: No results for input(s): PROCALCITON, LATICACIDVEN in the last 168 hours.  No results found for this or any previous visit (from the past 240 hours).       Radiology Studies: No results found.      Scheduled Meds:  enoxaparin  (LOVENOX ) injection  40 mg Subcutaneous Q24H   escitalopram   20 mg Oral Daily   guaiFENesin   600 mg Oral BID   multivitamin with minerals  1 tablet Oral Daily   nicotine   14 mg Transdermal Daily   pantoprazole   40 mg Oral Daily   QUEtiapine   25 mg Oral QHS   umeclidinium-vilanterol  1 puff Inhalation Daily   Continuous Infusions:   LOS: 34 days    Anthony CHRISTELLA Pouch, MD [facility] Pager [phone]  If 7PM-7AM, please contact night-coverage [email or web address] Password [email or web address] 11/23/2024, 9:21 AM

## 2024-11-24 NOTE — Progress Notes (Signed)
 Mobility Specialist Progress Note:    11/24/24 1444  Mobility  Activity Ambulated independently  Level of Assistance Modified independent, requires aide device or extra time  Assistive Device None  Distance Ambulated (ft) 200 ft  Range of Motion/Exercises Active;All extremities  Activity Response Tolerated well  Mobility visit 1 Mobility  Mobility Specialist Start Time (ACUTE ONLY) 1419  Mobility Specialist Stop Time (ACUTE ONLY) 1433  Mobility Specialist Time Calculation (min) (ACUTE ONLY) 14 min   Pt received in bed, agreeable to mobility. RN approved O2 LPM changes prior to mobility. Pt ModI to stand and ambulate with no AD. Tolerated well, see O2 sats below. Returned to room, left pt sitting EOB with O2 on 3L via [place] per RN. All needs met.  SpO2 89% on RA at rest SpO2 85% on RA during ambulation SpO2 89-92% on 3L during ambulation. Recovers well with rest breaks and deep breathing  Sherrilee Ditty Mobility Specialist Please contact via SecureChat or  Rehab office at [phone]

## 2024-11-24 NOTE — Plan of Care (Signed)
  Problem: Clinical Measurements: Goal: Will remain free from infection Outcome: Progressing   Problem: Nutrition: Goal: Adequate nutrition will be maintained Outcome: Progressing   Problem: Coping: Goal: Level of anxiety will decrease Outcome: Progressing   Problem: Elimination: Goal: Will not experience complications related to bowel motility Outcome: Progressing

## 2024-11-24 NOTE — TOC Progression Note (Signed)
 Transition of Care (TOC) - Progression Note    Patient Details  Name: Corey House. MRN: [id number] Date of Birth: 1966/11/09  [facility]) CM/SW Contact  Daved JONETTA Hamilton, RN Phone Number: 11/24/2024, 3:57 PM  Clinical Narrative:     This CM spoke with [employer] representative Zelvia and was advised that [facility] has declined patient. Zelvia advised this CM that [name] is considering and they have sent the [facility] and clinicals for their review. Zelvia does verbalize that this is the 29th location they have contacted for consideration of patient placement. TOC will continue to follow.   Expected Discharge Plan:  (patient is currently homeless, unsure of discharge location at this time) Barriers to Discharge: Financial Resources               Expected Discharge Plan and Services   Discharge Planning Services: CM Consult   Living arrangements for the past 2 months: Homeless Expected Discharge Date: 11/03/24                                     Social Drivers of Health (SDOH) Interventions SDOH Screenings   Food Insecurity: Food Insecurity Present (10/20/2024)  Housing: High Risk (10/21/2024)  Transportation Needs: Unmet Transportation Needs (10/20/2024)  Utilities: At Risk (10/20/2024)  Alcohol  Screen: Low Risk  (03/17/7973)  Social Connections: Unknown (04/23/2024)  Tobacco Use: High Risk (10/21/2024)    Readmission Risk Interventions     No data to display

## 2024-11-24 NOTE — Progress Notes (Signed)
 PROGRESS NOTE    [name] [name].  FMW:[id number] DOB: [birth year] DOA: 10/20/2024 PCP: Patient, [name]   Assessment & Plan:   Principal Problem:   Acute respiratory failure with hypoxia and hypercapnia (HCC) Active Problems:   COPD with acute exacerbation (HCC)   Acute on chronic diastolic CHF (congestive heart failure) (HCC)   Tobacco use disorder   Headache   Increased anion gap metabolic acidosis   Hypernatremia   Protein-calorie malnutrition, severe   Depression  Assessment and Plan: Acute hypoxic& hypercapnic respiratory failure: continue on supplemental oxygen and wean as tolerated. Bronchodilators prn & encourage incentive spirometry.  Unable to tolerate BiPAP.  Pt is homeless. Cannot go to shelter with oxygen. Repeat oxygen desaturation test today   COPD exacerbation: completed abx and steroid course. Continue on bronchodilators & encourage incentive spirometry     Acute on chronic diastolic CHF: echo showed EF 55-60%. Monitor I/Os. Holding diuretics    Tobacco use disorder: received smoking cessation counseling already     Anion gap metabolic acidosis: resolved   Hypernatremia: resolved   Depression: severity unknown. Continue on seroquel  & increased dose of lexapro         DVT prophylaxis: lovenox   Code Status: ful  Family Communication:  Disposition Plan: medically stable. Waiting on safe d/c plan   Level of care: Telemetry  Status is: Inpatient Remains inpatient appropriate because: medically stable. Waiting on safe d/c plan     Consultants:    Procedures:   Antimicrobials:   Subjective: Pt c/o fatigue   Objective: Vitals:   11/23/24 1637 11/23/24 1937 11/24/24 0500 11/24/24 0507  BP: 119/86 108/78  110/81  Pulse: 76 78  69  Resp:  20  20  Temp: 98.2 F (36.8 C) 98.2 F (36.8 C)  97.8 F (36.6 C)  TempSrc:  Oral    SpO2: 96% 95%  97%  Weight:   57.1 kg   Height:       No intake or output data in the 24 hours ending  11/24/24 0643  Filed Weights   11/20/24 0500 11/23/24 0419 11/24/24 0500  Weight: 57.6 kg 56.5 kg 57.1 kg    Examination:  General exam: Appears calm & comfortable  Respiratory system: decreased breath sounds b/l  Cardiovascular system: S1/S2+. No rubs or clicks  Gastrointestinal system: abd is soft, NT, ND & hypoactive bowel sounds Central nervous system: alert & oriented. Moves all extremities  Psychiatry: Judgement and insight appears at baseline. Flat mood and affect    Data Reviewed: I have personally reviewed following labs and imaging studies  CBC: Recent Labs  Lab 11/19/24 0531  WBC 5.1  HGB 12.9*  HCT 39.2  MCV 91.2  PLT 230   Basic Metabolic Panel: Recent Labs  Lab 11/19/24 0531  NA 139  K 4.1  CL 100  CO2 32  GLUCOSE 103*  BUN 18  CREATININE 0.71  CALCIUM 8.9  MG 1.9   GFR: Estimated Creatinine Clearance: 83.3 mL/min (by C-G formula based on SCr of 0.71 mg/dL). Liver Function Tests: No results for input(s): AST, ALT, ALKPHOS, BILITOT, PROT, ALBUMIN in the last 168 hours. No results for input(s): LIPASE, AMYLASE in the last 168 hours. No results for input(s): AMMONIA in the last 168 hours. Coagulation Profile: No results for input(s): INR, PROTIME in the last 168 hours. Cardiac Enzymes: No results for input(s): CKTOTAL, CKMB, CKMBINDEX, TROPONINI in the last 168 hours. BNP (last 3 results) Recent Labs    10/29/24 0608  PROBNP <50.0   HbA1C: No results for input(s): HGBA1C in the last 72 hours. CBG: No results for input(s): GLUCAP in the last 168 hours. Lipid Profile: No results for input(s): CHOL, HDL, LDLCALC, TRIG, CHOLHDL, LDLDIRECT in the last 72 hours. Thyroid Function Tests: No results for input(s): TSH, T4TOTAL, FREET4, T3FREE, THYROIDAB in the last 72 hours. Anemia Panel: No results for input(s): VITAMINB12, FOLATE, FERRITIN, TIBC, IRON, RETICCTPCT in the last 72  hours. Sepsis Labs: No results for input(s): PROCALCITON, LATICACIDVEN in the last 168 hours.  No results found for this or any previous visit (from the past 240 hours).       Radiology Studies: No results found.      Scheduled Meds:  enoxaparin  (LOVENOX ) injection  40 mg Subcutaneous Q24H   escitalopram   20 mg Oral Daily   guaiFENesin   600 mg Oral BID   multivitamin with minerals  1 tablet Oral Daily   nicotine   14 mg Transdermal Daily   pantoprazole   40 mg Oral Daily   QUEtiapine   25 mg Oral QHS   umeclidinium-vilanterol  1 puff Inhalation Daily   Continuous Infusions:   LOS: 35 days    Anthony CHRISTELLA Pouch, MD [facility] Pager [phone]  If 7PM-7AM, please contact night-coverage [email or web address] Password [email or web address] 11/24/2024, 6:43 AM

## 2024-11-24 NOTE — Plan of Care (Signed)

## 2024-11-25 DIAGNOSIS — J9601 Acute respiratory failure with hypoxia: Secondary | ICD-10-CM | POA: Diagnosis not present

## 2024-11-25 DIAGNOSIS — J9602 Acute respiratory failure with hypercapnia: Secondary | ICD-10-CM | POA: Diagnosis not present

## 2024-11-25 NOTE — Plan of Care (Signed)
°  Problem: Education: Goal: Knowledge of General Education information will improve Description: Including pain rating scale, medication(s)/side effects and non-pharmacologic comfort measures Outcome: Progressing   Problem: Health Behavior/Discharge Planning: Goal: Ability to manage health-related needs will improve Outcome: Progressing   Problem: Clinical Measurements: Goal: Ability to maintain clinical measurements within normal limits will improve Outcome: Progressing   Problem: Activity: Goal: Risk for activity intolerance will decrease Outcome: Progressing   Problem: Nutrition: Goal: Adequate nutrition will be maintained Outcome: Progressing   Problem: Coping: Goal: Level of anxiety will decrease Outcome: Progressing   Problem: Elimination: Goal: Will not experience complications related to bowel motility Outcome: Progressing   Problem: Pain Managment: Goal: General experience of comfort will improve and/or be controlled Outcome: Progressing   Problem: Skin Integrity: Goal: Risk for impaired skin integrity will decrease Outcome: Progressing   Problem: Education: Goal: Knowledge of disease or condition will improve Outcome: Progressing

## 2024-11-25 NOTE — TOC Progression Note (Signed)
 Transition of Care (TOC) - Progression Note    Patient Details  Name: Corey House. MRN: [id number] Date of Birth: [birth year]  [facility]) CM/SW Contact  Delphine KANDICE Bring, RN Phone Number: 11/25/2024, 4:30 PM  Clinical Narrative:    Cm was able to talk to a person at [facility]. She states that they did not received patient's application. Cm verified fax number [phone]. She gave CM an email address of [email or web address]. CM faxed and email application   Expected Discharge Plan:  (patient is currently homeless, unsure of discharge location at this time) Barriers to Discharge: Financial Resources               Expected Discharge Plan and Services   Discharge Planning Services: CM Consult   Living arrangements for the past 2 months: Homeless Expected Discharge Date: 11/03/24                                     Social Drivers of Health (SDOH) Interventions SDOH Screenings   Food Insecurity: Food Insecurity Present (10/20/2024)  Housing: High Risk (10/21/2024)  Transportation Needs: Unmet Transportation Needs (10/20/2024)  Utilities: At Risk (10/20/2024)  Alcohol  Screen: Low Risk  (04/23/2024)  Social Connections: Unknown (04/23/2024)  Tobacco Use: High Risk (10/21/2024)    Readmission Risk Interventions     No data to display

## 2024-11-25 NOTE — Plan of Care (Signed)
°  Problem: Clinical Measurements: Goal: Will remain free from infection Outcome: Progressing   Problem: Coping: Goal: Level of anxiety will decrease Outcome: Progressing   Problem: Pain Managment: Goal: General experience of comfort will improve and/or be controlled Outcome: Progressing   Problem: Skin Integrity: Goal: Risk for impaired skin integrity will decrease Outcome: Progressing   Problem: Activity: Goal: Ability to tolerate increased activity will improve Outcome: Progressing

## 2024-11-25 NOTE — Progress Notes (Signed)
 Nutrition Brief Follow-Up Note  Wt Readings from Last 15 Encounters:  11/25/24 57.3 kg  06/20/24 51.1 kg  04/22/24 49.9 kg  01/03/23 54.4 kg  10/04/21 51.7 kg  08/09/21 52.2 kg  03/19/21 59 kg  04/16/20 59 kg  03/06/20 59 kg  11/14/19 59 kg  06/29/18 68 kg  06/14/18 68 kg  05/04/18 74.8 kg  03/22/18 72.6 kg  01/30/17 72.6 kg   DOCUMENTATION CODES:    Severe malnutrition in context of social or environmental circumstances   INTERVENTION:    -Continue 2 gram sodium diet -Continue MVI with minerals daily -Continue Magic cup TID with meals, each supplement provides 290 kcal and 9 grams of protein  -RD will sign off secondary to medical stability; if further nutrition-related needs arise, please re-consult RD    NUTRITION DIAGNOSIS:    Severe Malnutrition related to social / environmental circumstances as evidenced by moderate fat depletion, severe fat depletion, moderate muscle depletion, severe muscle depletion, edema.   Ongoing   GOAL:    Patient will meet greater than or equal to 90% of their needs   Progressing   He is medically stable for discharge, but remains in the hospital secondary to lack of safe discharge plan.   Reviewed weight over the past month; weights have ranged from 52.6-57.3 kg.   Medications reviewed and include lovenox , lexapro , and protonix .   Current diet order is 2 gram sodium, patient is consuming approximately 50-100% of meals at this time. Labs and medications reviewed.   No nutrition interventions warranted at this time. If nutrition issues arise, please consult RD.   Margery ORN, RD, LDN, CDCES Registered Dietitian III Certified Diabetes Care and Education Specialist If unable to reach this RD, please use RD Inpatient group chat on secure chat between hours of 8am-4 pm daily

## 2024-11-25 NOTE — Progress Notes (Signed)
 PROGRESS NOTE   HPI was taken from NP [place]: Corey House. is a 58 y.o. year old male with past medical history of  tobacco use disorder reporting a 41 year pack per year history, smoking ~1 pack per day since he was 58 years old. He denies any additional medical history though he has not seen a medical provider in ~20 years. He presents to [facility] ED with shortness of breath that began ~ 9 months ago and worsened ~3 months ago. He reports today his dyspnea is not significantly different than it was 3 months ago. He endorses a chronic cough that has been present since childhood. Her ports the cough is currently, minimally productive with white phlegm. Additionally, he endorses bilateral lower extremity swelling that began in the last 2 days, per his report (R) > (L). On exam the swelling is equal bilaterally. He endorses fatigue and dyspnea that is worse with position changes such as bending over and when standing from lying. He denies chest pain, palpitations, or fever.   ED Course: On arrival to [facility] ED patient was noted to be afebrile temp 36.8C, BP 142/111, HR 88, RR 22, SPO2 100% on 15L non-rebreather. Patient now on 2L via nasal cannula with SPO2 96%. CXR obtained and shows non specific findings of increased interstitial markings. CTA PE obtained and shows no PE, dilated RV and reflux of contract into the IVC and hepatic vein consistent with (R) heart failure, chronic bronchitis, and emphysema. US  DVT (B)LE negative for DVT. Labs notable Negative COVID, flu, RSV,for BNP 284.2, BUN 24, troponin 41--> 38 with no report of chest pain.  Lactic acid 0.9.  Blood cultures drawn and in process.  He was given Lasix , DuoNebs, and Solu-Medrol .  TRH contacted for admission.    [name] [name].  FMW:[id number] DOB: [birth year] DOA: 10/20/2024 PCP: Patient, [name]   Assessment & Plan:   Principal Problem:   Acute respiratory failure with hypoxia and hypercapnia  (HCC) Active Problems:   COPD with acute exacerbation (HCC)   Acute on chronic diastolic CHF (congestive heart failure) (HCC)   Tobacco use disorder   Headache   Increased anion gap metabolic acidosis   Hypernatremia   Protein-calorie malnutrition, severe   Depression  Assessment and Plan: Acute hypoxic& hypercapnic respiratory failure: continue on supplemental oxygen and wean as tolerated. Bronchodilators prn & encourage incentive spirometry.  Unable to tolerate BiPAP.  Pt is homeless. Cannot go to shelter with oxygen. Repeat oxygen desaturation test which showed pt still needs supplemental oxygen   COPD exacerbation: completed abx and steroid course. Continue on bronchodilators & encourage incentive spirometry     Acute on chronic diastolic CHF: echo showed EF 55-60%. Monitor I/Os. Holding diuretics    Tobacco use disorder: received smoking cessation counseling already     Anion gap metabolic acidosis: resolved   Hypernatremia: resolved   Depression: severity unknown. Continue on seroquel  & increased dose of lexapro          DVT prophylaxis: lovenox   Code Status: ful  Family Communication: pt refused for me to call the pt's daughter or sister on his medical emergency contacts Disposition Plan: medically stable. Waiting on safe d/c plan   Level of care: Telemetry  Status is: Inpatient Remains inpatient appropriate because: medically stable. Waiting on safe d/c plan     Consultants:    Procedures:   Antimicrobials:   Subjective: Pt c/o malaise   Objective: Vitals:   11/24/24 2032 11/25/24 0442  11/25/24 0500 11/25/24 0829  BP: 111/83 101/61  107/75  Pulse: 77 75  72  Resp: 12 17  18   Temp: 98 F (36.7 C) (!) 97.5 F (36.4 C)  (!) 97.4 F (36.3 C)  TempSrc: Oral Oral  Oral  SpO2: 94% 98%  99%  Weight:   57.3 kg   Height:        Intake/Output Summary (Last 24 hours) at 11/25/2024 0947 Last data filed at 11/24/2024 1900 Gross per 24 hour  Intake 480  ml  Output --  Net 480 ml    Filed Weights   11/23/24 0419 11/24/24 0500 11/25/24 0500  Weight: 56.5 kg 57.1 kg 57.3 kg    Examination:  General exam: Appears comfortable  Respiratory system: diminished breath sounds  Cardiovascular system: S1 & S2+ Gastrointestinal system: abd is soft, NT, ND & hypoactive bowel sounds Central nervous system: alert & oriented. Moves all extremities  Psychiatry: Judgement and insight appears at baseline. Flat mood and affect   Data Reviewed: I have personally reviewed following labs and imaging studies  CBC: Recent Labs  Lab 11/19/24 0531  WBC 5.1  HGB 12.9*  HCT 39.2  MCV 91.2  PLT 230   Basic Metabolic Panel: Recent Labs  Lab 11/19/24 0531  NA 139  K 4.1  CL 100  CO2 32  GLUCOSE 103*  BUN 18  CREATININE 0.71  CALCIUM 8.9  MG 1.9   GFR: Estimated Creatinine Clearance: 83.6 mL/min (by C-G formula based on SCr of 0.71 mg/dL). Liver Function Tests: No results for input(s): AST, ALT, ALKPHOS, BILITOT, PROT, ALBUMIN in the last 168 hours. No results for input(s): LIPASE, AMYLASE in the last 168 hours. No results for input(s): AMMONIA in the last 168 hours. Coagulation Profile: No results for input(s): INR, PROTIME in the last 168 hours. Cardiac Enzymes: No results for input(s): CKTOTAL, CKMB, CKMBINDEX, TROPONINI in the last 168 hours. BNP (last 3 results) Recent Labs    10/29/24 0608  PROBNP <50.0   HbA1C: No results for input(s): HGBA1C in the last 72 hours. CBG: No results for input(s): GLUCAP in the last 168 hours. Lipid Profile: No results for input(s): CHOL, HDL, LDLCALC, TRIG, CHOLHDL, LDLDIRECT in the last 72 hours. Thyroid Function Tests: No results for input(s): TSH, T4TOTAL, FREET4, T3FREE, THYROIDAB in the last 72 hours. Anemia Panel: No results for input(s): VITAMINB12, FOLATE, FERRITIN, TIBC, IRON, RETICCTPCT in the last 72  hours. Sepsis Labs: No results for input(s): PROCALCITON, LATICACIDVEN in the last 168 hours.  No results found for this or any previous visit (from the past 240 hours).       Radiology Studies: No results found.      Scheduled Meds:  enoxaparin  (LOVENOX ) injection  40 mg Subcutaneous Q24H   escitalopram   20 mg Oral Daily   guaiFENesin   600 mg Oral BID   multivitamin with minerals  1 tablet Oral Daily   nicotine   14 mg Transdermal Daily   pantoprazole   40 mg Oral Daily   QUEtiapine   25 mg Oral QHS   umeclidinium-vilanterol  1 puff Inhalation Daily   Continuous Infusions:   LOS: 36 days    Anthony CHRISTELLA Pouch, MD [facility] Pager [phone]  If 7PM-7AM, please contact night-coverage [email or web address] Password [email or web address] 11/25/2024, 9:47 AM

## 2024-11-26 DIAGNOSIS — J9601 Acute respiratory failure with hypoxia: Secondary | ICD-10-CM | POA: Diagnosis not present

## 2024-11-26 DIAGNOSIS — J9602 Acute respiratory failure with hypercapnia: Secondary | ICD-10-CM | POA: Diagnosis not present

## 2024-11-26 MED ORDER — HYDROXYZINE HCL 25 MG PO TABS
25.0000 mg | ORAL_TABLET | Freq: Three times a day (TID) | ORAL | Status: DC | PRN
  Administered 2024-11-26: 25 mg via ORAL
  Filled 2024-11-26: qty 1

## 2024-11-26 MED ORDER — ALPRAZOLAM 0.25 MG PO TABS
0.2500 mg | ORAL_TABLET | Freq: Every day | ORAL | Status: DC | PRN
  Administered 2024-11-27 – 2024-11-28 (×3): 0.25 mg via ORAL
  Filled 2024-11-26 (×3): qty 1

## 2024-11-26 MED ORDER — ALPRAZOLAM 0.25 MG PO TABS: 0.2500 mg | ORAL_TABLET | Freq: Every day | ORAL | Status: DC | PRN

## 2024-11-26 NOTE — Progress Notes (Addendum)
 PROGRESS NOTE   HPI was taken from NP Foust: Corey House. is a 58 y.o. year old male with past medical history of  tobacco use disorder reporting a 41 year pack per year history, smoking ~1 pack per day since he was 58 years old. He denies any additional medical history though he has not seen a medical provider in ~20 years. He presents to [facility] ED with shortness of breath that began ~ 9 months ago and worsened ~3 months ago. He reports today his dyspnea is not significantly different than it was 3 months ago. He endorses a chronic cough that has been present since childhood. Her ports the cough is currently, minimally productive with white phlegm. Additionally, he endorses bilateral lower extremity swelling that began in the last 2 days, per his report (R) > (L). On exam the swelling is equal bilaterally. He endorses fatigue and dyspnea that is worse with position changes such as bending over and when standing from lying. He denies chest pain, palpitations, or fever.   ED Course: On arrival to [facility] ED patient was noted to be afebrile temp 36.8C, BP 142/111, HR 88, RR 22, SPO2 100% on 15L non-rebreather. Patient now on 2L via nasal cannula with SPO2 96%. CXR obtained and shows non specific findings of increased interstitial markings. CTA PE obtained and shows no PE, dilated RV and reflux of contract into the IVC and hepatic vein consistent with (R) heart failure, chronic bronchitis, and emphysema. US  DVT (B)LE negative for DVT. Labs notable Negative COVID, flu, RSV,for BNP 284.2, BUN 24, troponin 41--> 38 with no report of chest pain.  Lactic acid 0.9.  Blood cultures drawn and in process.  He was given Lasix , DuoNebs, and Solu-Medrol .  TRH contacted for admission.    [name] [name].  FMW:[id number] DOB: [birth year] DOA: 10/20/2024 PCP: Patient, [name]   Assessment & Plan:   Principal Problem:   Acute respiratory failure with hypoxia and hypercapnia  (HCC) Active Problems:   COPD with acute exacerbation (HCC)   Acute on chronic diastolic CHF (congestive heart failure) (HCC)   Tobacco use disorder   Headache   Increased anion gap metabolic acidosis   Hypernatremia   Protein-calorie malnutrition, severe   Depression  Assessment and Plan: Acute hypoxic& hypercapnic respiratory failure on chronic hypoxic respiratory failure: continue on supplemental oxygen and wean as tolerated. Bronchodilators prn & encourage incentive spirometry.  Unable to tolerate BiPAP.  Pt is homeless. Cannot go to shelter with oxygen. Repeat oxygen desaturation test which showed pt still needs supplemental oxygen. Acute respiratory failure resolved, now  COPD exacerbation: resolved, completed abx and steroid course. Continue on bronchodilators & encourage incentive spirometry     Acute on chronic diastolic CHF: echo showed EF 55-60%. Monitor I/Os. Holding diuretics    Tobacco use disorder: received smoking cessation counseling already     Anion gap metabolic acidosis: resolved   Hypernatremia: resolved   Depression: severity unknown. Continue on seroquel  & increased dose of lexapro    Anxiety: we have been giving xanax  3x daily for about a month now. Not on this med at home. Not an appropriate med for long-term anxiolysis. Will make daily prn for 3 more days and then plan to discontinue.       DVT prophylaxis: lovenox   Code Status: ful  Family Communication: none at bedside Disposition Plan: medically stable. Waiting on safe d/c plan per toc  Level of care: Telemetry  Status is: Inpatient Remains inpatient  appropriate because: medically stable. Waiting on safe d/c plan    Subjective:  No complaints, regular BMs, breathing stable  Objective: Vitals:   11/25/24 1916 11/26/24 0446 11/26/24 0500 11/26/24 0822  BP: 126/84 107/72  108/79  Pulse: 83 72  70  Resp: 18 16  18   Temp: 98.1 F (36.7 C) (!) 97.4 F (36.3 C)  97.6 F (36.4 C)  TempSrc:  Oral Oral    SpO2: 96% 97%  96%  Weight:   57.9 kg   Height:        Intake/Output Summary (Last 24 hours) at 11/26/2024 1558 Last data filed at 11/26/2024 1450 Gross per 24 hour  Intake 480 ml  Output --  Net 480 ml    Filed Weights   11/24/24 0500 11/25/24 0500 11/26/24 0500  Weight: 57.1 kg 57.3 kg 57.9 kg    Examination:  General exam: Appears comfortable  Respiratory system: diminished breath sounds , no wheeze or rales Cardiovascular system: S1 & S2+ Gastrointestinal system: abd is soft, NT  Central nervous system: alert & oriented. Moves all extremities  Psychiatry: Judgement and insight appears at baseline. Flat mood and affect   Data Reviewed: I have personally reviewed following labs and imaging studies  CBC: No results for input(s): WBC, NEUTROABS, HGB, HCT, MCV, PLT in the last 168 hours.  Basic Metabolic Panel: No results for input(s): NA, K, CL, CO2, GLUCOSE, BUN, CREATININE, CALCIUM, MG, PHOS in the last 168 hours.  GFR: Estimated Creatinine Clearance: 84.4 mL/min (by C-G formula based on SCr of 0.71 mg/dL). Liver Function Tests: No results for input(s): AST, ALT, ALKPHOS, BILITOT, PROT, ALBUMIN in the last 168 hours. No results for input(s): LIPASE, AMYLASE in the last 168 hours. No results for input(s): AMMONIA in the last 168 hours. Coagulation Profile: No results for input(s): INR, PROTIME in the last 168 hours. Cardiac Enzymes: No results for input(s): CKTOTAL, CKMB, CKMBINDEX, TROPONINI in the last 168 hours. BNP (last 3 results) Recent Labs    10/29/24 0608  PROBNP <50.0   HbA1C: No results for input(s): HGBA1C in the last 72 hours. CBG: No results for input(s): GLUCAP in the last 168 hours. Lipid Profile: No results for input(s): CHOL, HDL, LDLCALC, TRIG, CHOLHDL, LDLDIRECT in the last 72 hours. Thyroid Function Tests: No results for input(s): TSH,  T4TOTAL, FREET4, T3FREE, THYROIDAB in the last 72 hours. Anemia Panel: No results for input(s): VITAMINB12, FOLATE, FERRITIN, TIBC, IRON, RETICCTPCT in the last 72 hours. Sepsis Labs: No results for input(s): PROCALCITON, LATICACIDVEN in the last 168 hours.  No results found for this or any previous visit (from the past 240 hours).       Radiology Studies: No results found.      Scheduled Meds:  enoxaparin  (LOVENOX ) injection  40 mg Subcutaneous Q24H   escitalopram   20 mg Oral Daily   guaiFENesin   600 mg Oral BID   multivitamin with minerals  1 tablet Oral Daily   nicotine   14 mg Transdermal Daily   pantoprazole   40 mg Oral Daily   QUEtiapine   25 mg Oral QHS   umeclidinium-vilanterol  1 puff Inhalation Daily   Continuous Infusions:   LOS: 37 days    Devaughn KATHEE Ban, MD [facility]  If 7PM-7AM, please contact night-coverage [email or web address] Password [facility] 11/26/2024, 3:58 PM

## 2024-11-26 NOTE — Plan of Care (Signed)
  Problem: Clinical Measurements: Goal: Will remain free from infection Outcome: Progressing   Problem: Activity: Goal: Risk for activity intolerance will decrease Outcome: Progressing   Problem: Coping: Goal: Level of anxiety will decrease Outcome: Progressing   Problem: Pain Managment: Goal: General experience of comfort will improve and/or be controlled Outcome: Progressing

## 2024-11-26 NOTE — TOC Progression Note (Signed)
 Transition of Care (TOC) - Progression Note    Patient Details  Name: Corey House. MRN: [id number] Date of Birth: 09-09-1966  Transition of Care [facility]) CM/SW Contact  Delphine KANDICE Bring, RN Phone Number: 11/26/2024, 3:37 PM  Clinical Narrative:    Cm spoke with [name]. She states that she has to write a medical synopsis dating back to 2020 to present.    Expected Discharge Plan:  (patient is currently homeless, unsure of discharge location at this time) Barriers to Discharge: Financial Resources               Expected Discharge Plan and Services   Discharge Planning Services: CM Consult   Living arrangements for the past 2 months: Homeless Expected Discharge Date: 11/03/24                                     Social Drivers of Health (SDOH) Interventions SDOH Screenings   Food Insecurity: Food Insecurity Present (10/20/2024)  Housing: High Risk (10/21/2024)  Transportation Needs: Unmet Transportation Needs (10/20/2024)  Utilities: At Risk (10/20/2024)  Alcohol  Screen: Low Risk (04/23/2024)  Social Connections: Unknown (04/23/2024)  Tobacco Use: High Risk (10/21/2024)    Readmission Risk Interventions     No data to display

## 2024-11-26 NOTE — Plan of Care (Signed)
°  Problem: Education: Goal: Knowledge of General Education information will improve Description: Including pain rating scale, medication(s)/side effects and non-pharmacologic comfort measures Outcome: Progressing   Problem: Health Behavior/Discharge Planning: Goal: Ability to manage health-related needs will improve Outcome: Progressing   Problem: Clinical Measurements: Goal: Ability to maintain clinical measurements within normal limits will improve Outcome: Progressing   Problem: Activity: Goal: Risk for activity intolerance will decrease Outcome: Progressing   Problem: Nutrition: Goal: Adequate nutrition will be maintained Outcome: Progressing   Problem: Coping: Goal: Level of anxiety will decrease Outcome: Progressing   Problem: Elimination: Goal: Will not experience complications related to bowel motility Outcome: Progressing   Problem: Pain Managment: Goal: General experience of comfort will improve and/or be controlled Outcome: Progressing   Problem: Activity: Goal: Ability to tolerate increased activity will improve Outcome: Progressing   Problem: Respiratory: Goal: Ability to maintain a clear airway will improve Outcome: Progressing

## 2024-11-27 DIAGNOSIS — J9601 Acute respiratory failure with hypoxia: Secondary | ICD-10-CM | POA: Diagnosis not present

## 2024-11-27 DIAGNOSIS — J9602 Acute respiratory failure with hypercapnia: Secondary | ICD-10-CM | POA: Diagnosis not present

## 2024-11-27 NOTE — TOC Progression Note (Signed)
 Transition of Care (TOC) - Progression Note    Patient Details  Name: Corey House. MRN: [id number] Date of Birth: 08/12/1966  Transition of Care [facility]) CM/SW Contact  Delphine KANDICE Bring, RN Phone Number: 11/27/2024, 10:02 AM  Clinical Narrative:    CM called the  [employer] , [phone]. Cm have to ask for [facility] department.  [employer] received homebound application via fax . CM was informed that  it will take 2 weeks for them to contact the primary person on the application after they pull the application .CM 's phone number is listed on the application    Expected Discharge Plan:  (patient is currently homeless, unsure of discharge location at this time) Barriers to Discharge: Financial Resources               Expected Discharge Plan and Services   Discharge Planning Services: CM Consult   Living arrangements for the past 2 months: Homeless Expected Discharge Date: 11/03/24                                     Social Drivers of Health (SDOH) Interventions SDOH Screenings   Food Insecurity: Food Insecurity Present (10/20/2024)  Housing: High Risk (10/21/2024)  Transportation Needs: Unmet Transportation Needs (10/20/2024)  Utilities: At Risk (10/20/2024)  Alcohol  Screen: Low Risk (04/23/2024)  Social Connections: Unknown (04/23/2024)  Tobacco Use: High Risk (10/21/2024)    Readmission Risk Interventions     No data to display

## 2024-11-27 NOTE — Plan of Care (Signed)
°  Problem: Clinical Measurements: Goal: Ability to maintain clinical measurements within normal limits will improve Outcome: Progressing   Problem: Pain Managment: Goal: General experience of comfort will improve and/or be controlled Outcome: Progressing   Problem: Respiratory: Goal: Ability to maintain a clear airway will improve Outcome: Progressing   Problem: Respiratory: Goal: Levels of oxygenation will improve Outcome: Progressing   Problem: Coping: Goal: Level of anxiety will decrease Outcome: Progressing

## 2024-11-27 NOTE — Plan of Care (Signed)

## 2024-11-27 NOTE — Progress Notes (Signed)
 PROGRESS NOTE   HPI was taken from NP Foust: Corey Lenn. is a 58 y.o. year old male with past medical history of  tobacco use disorder reporting a 41 year pack per year history, smoking ~1 pack per day since he was 58 years old. He denies any additional medical history though he has not seen a medical provider in ~20 years. He presents to [facility] ED with shortness of breath that began ~ 9 months ago and worsened ~3 months ago. He reports today his dyspnea is not significantly different than it was 3 months ago. He endorses a chronic cough that has been present since childhood. Her ports the cough is currently, minimally productive with white phlegm. Additionally, he endorses bilateral lower extremity swelling that began in the last 2 days, per his report (R) > (L). On exam the swelling is equal bilaterally. He endorses fatigue and dyspnea that is worse with position changes such as bending over and when standing from lying. He denies chest pain, palpitations, or fever.   ED Course: On arrival to [facility] ED patient was noted to be afebrile temp 36.8C, BP 142/111, HR 88, RR 22, SPO2 100% on 15L non-rebreather. Patient now on 2L via nasal cannula with SPO2 96%. CXR obtained and shows non specific findings of increased interstitial markings. CTA PE obtained and shows no PE, dilated RV and reflux of contract into the IVC and hepatic vein consistent with (R) heart failure, chronic bronchitis, and emphysema. US  DVT (B)LE negative for DVT. Labs notable Negative COVID, flu, RSV,for BNP 284.2, BUN 24, troponin 41--> 38 with no report of chest pain.  Lactic acid 0.9.  Blood cultures drawn and in process.  He was given Lasix , DuoNebs, and Solu-Medrol .  TRH contacted for admission.    Corey LELON [name].  FMW:[id number] DOB: 27-Jul-1966 DOA: 10/20/2024 PCP: Patient, [name]   Assessment & Plan:   Principal Problem:   Acute respiratory failure with hypoxia and hypercapnia  (HCC) Active Problems:   COPD with acute exacerbation (HCC)   Acute on chronic diastolic CHF (congestive heart failure) (HCC)   Tobacco use disorder   Headache   Increased anion gap metabolic acidosis   Hypernatremia   Protein-calorie malnutrition, severe   Depression  Assessment and Plan: Acute hypoxic& hypercapnic respiratory failure on chronic hypoxic respiratory failure: continue on supplemental oxygen and wean as tolerated. Bronchodilators prn & encourage incentive spirometry.  Unable to tolerate BiPAP.  Pt is homeless. Cannot go to shelter with oxygen. Repeat oxygen desaturation test which showed pt still needs supplemental oxygen. Acute respiratory failure resolved, now  COPD exacerbation: resolved, completed abx and steroid course. Continue on bronchodilators & encourage incentive spirometry     Acute on chronic diastolic CHF: echo showed EF 55-60%. Monitor I/Os. Holding diuretics    Tobacco use disorder: received smoking cessation counseling already     Anion gap metabolic acidosis: resolved   Hypernatremia: resolved   Depression: severity unknown. Continue on seroquel  & increased dose of lexapro    Anxiety: we have been giving xanax  3x daily for about a month now. Not on this med at home. Not an appropriate med for long-term anxiolysis. Will make daily prn for 3 days (today day 2) and then plan to discontinue.       DVT prophylaxis: lovenox   Code Status: ful  Family Communication: none at bedside Disposition Plan: medically stable. Waiting on safe d/c plan per toc  Level of care: Telemetry  Status is: Inpatient  Remains inpatient appropriate because: medically stable. Waiting on safe d/c plan    Subjective:  No complaints, regular BMs, breathing stable, no concerns  Objective: Vitals:   11/27/24 0550 11/27/24 0600 11/27/24 0827 11/27/24 1622  BP: 109/78  107/71 112/84  Pulse: 76  74 77  Resp: 19  16 16   Temp: 97.7 F (36.5 C)  97.7 F (36.5 C) 98.1 F  (36.7 C)  TempSrc: Oral     SpO2: 97%  97% 96%  Weight:  57.9 kg    Height:        Intake/Output Summary (Last 24 hours) at 11/27/2024 1634 Last data filed at 11/27/2024 0900 Gross per 24 hour  Intake 720 ml  Output --  Net 720 ml    Filed Weights   11/25/24 0500 11/26/24 0500 11/27/24 0600  Weight: 57.3 kg 57.9 kg 57.9 kg    Examination:  General exam: Appears comfortable  Respiratory system: diminished breath sounds , no wheeze or rales Cardiovascular system: S1 & S2+ Gastrointestinal system: abd is soft, NT  Central nervous system: alert & oriented. Moves all extremities  Psychiatry: Judgement and insight appears at baseline. Flat mood and affect   Data Reviewed: I have personally reviewed following labs and imaging studies  CBC: No results for input(s): WBC, NEUTROABS, HGB, HCT, MCV, PLT in the last 168 hours.  Basic Metabolic Panel: No results for input(s): NA, K, CL, CO2, GLUCOSE, BUN, CREATININE, CALCIUM, MG, PHOS in the last 168 hours.  GFR: Estimated Creatinine Clearance: 84.4 mL/min (by C-G formula based on SCr of 0.71 mg/dL). Liver Function Tests: No results for input(s): AST, ALT, ALKPHOS, BILITOT, PROT, ALBUMIN in the last 168 hours. No results for input(s): LIPASE, AMYLASE in the last 168 hours. No results for input(s): AMMONIA in the last 168 hours. Coagulation Profile: No results for input(s): INR, PROTIME in the last 168 hours. Cardiac Enzymes: No results for input(s): CKTOTAL, CKMB, CKMBINDEX, TROPONINI in the last 168 hours. BNP (last 3 results) Recent Labs    10/29/24 0608  PROBNP <50.0   HbA1C: No results for input(s): HGBA1C in the last 72 hours. CBG: No results for input(s): GLUCAP in the last 168 hours. Lipid Profile: No results for input(s): CHOL, HDL, LDLCALC, TRIG, CHOLHDL, LDLDIRECT in the last 72 hours. Thyroid Function Tests: No results for  input(s): TSH, T4TOTAL, FREET4, T3FREE, THYROIDAB in the last 72 hours. Anemia Panel: No results for input(s): VITAMINB12, FOLATE, FERRITIN, TIBC, IRON, RETICCTPCT in the last 72 hours. Sepsis Labs: No results for input(s): PROCALCITON, LATICACIDVEN in the last 168 hours.  No results found for this or any previous visit (from the past 240 hours).       Radiology Studies: No results found.      Scheduled Meds:  enoxaparin  (LOVENOX ) injection  40 mg Subcutaneous Q24H   escitalopram   20 mg Oral Daily   guaiFENesin   600 mg Oral BID   multivitamin with minerals  1 tablet Oral Daily   nicotine   14 mg Transdermal Daily   pantoprazole   40 mg Oral Daily   QUEtiapine   25 mg Oral QHS   umeclidinium-vilanterol  1 puff Inhalation Daily   Continuous Infusions:   LOS: 38 days    Corey KATHEE Ban, MD [facility]  If 7PM-7AM, please contact night-coverage [email or web address] Password [email or web address] 11/27/2024, 4:34 PM

## 2024-11-28 NOTE — Plan of Care (Signed)

## 2024-11-28 NOTE — Progress Notes (Signed)
 PROGRESS NOTE   HPI was taken from NP Foust: Corey House. is a 58 y.o. year old male with past medical history of  tobacco use disorder reporting a 41 year pack per year history, smoking ~1 pack per day since he was 58 years old. He denies any additional medical history though he has not seen a medical provider in ~20 years. He presents to [facility] ED with shortness of breath that began ~ 9 months ago and worsened ~3 months ago. He reports today his dyspnea is not significantly different than it was 3 months ago. He endorses a chronic cough that has been present since childhood. Her ports the cough is currently, minimally productive with white phlegm. Additionally, he endorses bilateral lower extremity swelling that began in the last 2 days, per his report (R) > (L). On exam the swelling is equal bilaterally. He endorses fatigue and dyspnea that is worse with position changes such as bending over and when standing from lying. He denies chest pain, palpitations, or fever.   ED Course: On arrival to [facility] ED patient was noted to be afebrile temp 36.8C, BP 142/111, HR 88, RR 22, SPO2 100% on 15L non-rebreather. Patient now on 2L via nasal cannula with SPO2 96%. CXR obtained and shows non specific findings of increased interstitial markings. CTA PE obtained and shows no PE, dilated RV and reflux of contract into the IVC and hepatic vein consistent with (R) heart failure, chronic bronchitis, and emphysema. US  DVT (B)LE negative for DVT. Labs notable Negative COVID, flu, RSV,for BNP 284.2, BUN 24, troponin 41--> 38 with no report of chest pain.  Lactic acid 0.9.  Blood cultures drawn and in process.  He was given Lasix , DuoNebs, and Solu-Medrol .  TRH contacted for admission.  12/13: Needed dose of anxiety medicine last night per nursing    Corey House.  FMW:[id number] DOB: [birth year] DOA: 10/20/2024 PCP: Patient, [name]   Assessment & Plan:   Principal Problem:    Acute respiratory failure with hypoxia and hypercapnia (HCC) Active Problems:   COPD with acute exacerbation (HCC)   Acute on chronic diastolic CHF (congestive heart failure) (HCC)   Tobacco use disorder   Headache   Increased anion gap metabolic acidosis   Hypernatremia   Protein-calorie malnutrition, severe   Depression  Assessment and Plan: Acute hypoxic& hypercapnic respiratory failure on chronic hypoxic respiratory failure: continue on supplemental oxygen and wean as tolerated. Bronchodilators prn & encourage incentive spirometry.  Unable to tolerate BiPAP.  Pt is homeless. Cannot go to shelter with oxygen. Repeat oxygen desaturation test which showed pt still needs supplemental oxygen. Acute respiratory failure resolved, now  COPD exacerbation: resolved, completed abx and steroid course. Continue on bronchodilators & encourage incentive spirometry     Acute on chronic diastolic CHF: echo showed EF 55-60%. Monitor I/Os. Holding diuretics    Tobacco use disorder: received smoking cessation counseling already     Anion gap metabolic acidosis: resolved   Hypernatremia: resolved   Depression: severity unknown. Continue on seroquel  & increased dose of lexapro    Anxiety: we have been giving xanax  3x daily for about a month now. Not on this med at home. Not an appropriate med for long-term anxiolysis. Will make daily prn for 3 days (today day 3) and then plan to discontinue.       DVT prophylaxis: lovenox   Code Status: ful  Family Communication: none at bedside Disposition Plan: medically stable. Waiting on safe d/c plan  per toc  Level of care: Telemetry  Status is: Inpatient Remains inpatient appropriate because: medically stable. Waiting on safe d/c plan    Subjective:  No new issues.  Objective: Vitals:   11/27/24 1622 11/27/24 2000 11/28/24 0500 11/28/24 0602  BP: 112/84 105/78  102/73  Pulse: 77 80  76  Resp: 16 18  18   Temp: 98.1 F (36.7 C) 98.6 F (37 C)   (!) 97.5 F (36.4 C)  TempSrc:  Oral    SpO2: 96% 96%  97%  Weight:   57.5 kg   Height:        Intake/Output Summary (Last 24 hours) at 11/28/2024 1149 Last data filed at 11/27/2024 1900 Gross per 24 hour  Intake 480 ml  Output --  Net 480 ml    Filed Weights   11/26/24 0500 11/27/24 0600 11/28/24 0500  Weight: 57.9 kg 57.9 kg 57.5 kg    Examination:  General exam: Appears comfortable  Respiratory system: diminished breath sounds , no wheeze or rales Cardiovascular system: S1 & S2+ Gastrointestinal system: abd is soft, NT  Central nervous system: alert & oriented. Moves all extremities  Psychiatry: Judgement and insight appears at baseline. Flat mood and affect    Scheduled Meds:  enoxaparin  (LOVENOX ) injection  40 mg Subcutaneous Q24H   escitalopram   20 mg Oral Daily   guaiFENesin   600 mg Oral BID   multivitamin with minerals  1 tablet Oral Daily   nicotine   14 mg Transdermal Daily   pantoprazole   40 mg Oral Daily   QUEtiapine   25 mg Oral QHS   umeclidinium-vilanterol  1 puff Inhalation Daily   Continuous Infusions:   LOS: 39 days  Time spent 15 minutes  [name], MD [facility]  If 7PM-7AM, please contact night-coverage [email or web address]  11/28/2024, 11:49 AM

## 2024-11-29 MED ORDER — ALPRAZOLAM 0.25 MG PO TABS
0.2500 mg | ORAL_TABLET | Freq: Three times a day (TID) | ORAL | Status: DC | PRN
  Administered 2024-11-29 – 2024-12-28 (×76): 0.25 mg via ORAL
  Filled 2024-11-29 (×60): qty 1

## 2024-11-29 MED ORDER — ACETAMINOPHEN-CAFFEINE 500-65 MG PO TABS
1.0000 | ORAL_TABLET | Freq: Three times a day (TID) | ORAL | Status: AC | PRN
  Administered 2024-12-01 – 2025-01-22 (×18): 1 via ORAL
  Filled 2024-11-29 (×21): qty 1

## 2024-11-29 NOTE — Plan of Care (Signed)
 Problem: Education: Goal: Knowledge of General Education information will improve Description: Including pain rating scale, medication(s)/side effects and non-pharmacologic comfort measures 11/29/2024 1638 by Estelle Nat SAILOR, RN Outcome: Progressing 11/29/2024 1638 by Estelle Nat SAILOR, RN Outcome: Progressing   Problem: Health Behavior/Discharge Planning: Goal: Ability to manage health-related needs will improve 11/29/2024 1638 by Estelle Nat SAILOR, RN Outcome: Progressing 11/29/2024 1638 by Estelle Nat SAILOR, RN Outcome: Progressing   Problem: Clinical Measurements: Goal: Ability to maintain clinical measurements within normal limits will improve 11/29/2024 1638 by Estelle Nat SAILOR, RN Outcome: Progressing 11/29/2024 1638 by Estelle Nat SAILOR, RN Outcome: Progressing Goal: Will remain free from infection 11/29/2024 1638 by Estelle Nat SAILOR, RN Outcome: Progressing 11/29/2024 1638 by Estelle Nat SAILOR, RN Outcome: Progressing Goal: Diagnostic test results will improve 11/29/2024 1638 by Estelle Nat SAILOR, RN Outcome: Progressing 11/29/2024 1638 by Estelle Nat SAILOR, RN Outcome: Progressing Goal: Respiratory complications will improve 11/29/2024 1638 by Estelle Nat SAILOR, RN Outcome: Progressing 11/29/2024 1638 by Estelle Nat SAILOR, RN Outcome: Progressing Goal: Cardiovascular complication will be avoided 11/29/2024 1638 by Estelle Nat SAILOR, RN Outcome: Progressing 11/29/2024 1638 by Estelle Nat SAILOR, RN Outcome: Progressing   Problem: Activity: Goal: Risk for activity intolerance will decrease 11/29/2024 1638 by Estelle Nat SAILOR, RN Outcome: Progressing 11/29/2024 1638 by Estelle Nat SAILOR, RN Outcome: Progressing   Problem: Nutrition: Goal: Adequate nutrition will be maintained 11/29/2024 1638 by Estelle Nat SAILOR, RN Outcome: Progressing 11/29/2024 1638 by Estelle Nat SAILOR, RN Outcome:  Progressing   Problem: Coping: Goal: Level of anxiety will decrease 11/29/2024 1638 by Estelle Nat SAILOR, RN Outcome: Progressing 11/29/2024 1638 by Estelle Nat SAILOR, RN Outcome: Progressing   Problem: Elimination: Goal: Will not experience complications related to bowel motility 11/29/2024 1638 by Estelle Nat SAILOR, RN Outcome: Progressing 11/29/2024 1638 by Estelle Nat SAILOR, RN Outcome: Progressing Goal: Will not experience complications related to urinary retention 11/29/2024 1638 by Estelle Nat SAILOR, RN Outcome: Progressing 11/29/2024 1638 by Estelle Nat SAILOR, RN Outcome: Progressing   Problem: Pain Managment: Goal: General experience of comfort will improve and/or be controlled 11/29/2024 1638 by Estelle Nat SAILOR, RN Outcome: Progressing 11/29/2024 1638 by Estelle Nat SAILOR, RN Outcome: Progressing   Problem: Safety: Goal: Ability to remain free from injury will improve 11/29/2024 1638 by Estelle Nat SAILOR, RN Outcome: Progressing 11/29/2024 1638 by Estelle Nat SAILOR, RN Outcome: Progressing   Problem: Skin Integrity: Goal: Risk for impaired skin integrity will decrease 11/29/2024 1638 by Estelle Nat SAILOR, RN Outcome: Progressing 11/29/2024 1638 by Estelle Nat SAILOR, RN Outcome: Progressing   Problem: Education: Goal: Knowledge of disease or condition will improve 11/29/2024 1638 by Estelle Nat SAILOR, RN Outcome: Progressing 11/29/2024 1638 by Estelle Nat SAILOR, RN Outcome: Progressing Goal: Knowledge of the prescribed therapeutic regimen will improve 11/29/2024 1638 by Estelle Nat SAILOR, RN Outcome: Progressing 11/29/2024 1638 by Estelle Nat SAILOR, RN Outcome: Progressing Goal: Individualized Educational Video(s) 11/29/2024 1638 by Estelle Nat SAILOR, RN Outcome: Progressing 11/29/2024 1638 by Estelle Nat SAILOR, RN Outcome: Progressing   Problem: Activity: Goal: Ability to tolerate increased  activity will improve 11/29/2024 1638 by Estelle Nat SAILOR, RN Outcome: Progressing 11/29/2024 1638 by Estelle Nat SAILOR, RN Outcome: Progressing Goal: Will verbalize the importance of balancing activity with adequate rest periods 11/29/2024 1638 by Estelle Nat SAILOR, RN Outcome: Progressing 11/29/2024 1638 by Estelle Nat SAILOR, RN Outcome: Progressing   Problem: Respiratory: Goal: Ability to maintain a clear airway will improve 11/29/2024 1638 by Estelle Nat SAILOR, RN Outcome:  Progressing 11/29/2024 1638 by Estelle Nat SAILOR, RN Outcome: Progressing Goal: Levels of oxygenation will improve 11/29/2024 1638 by Estelle Nat SAILOR, RN Outcome: Progressing 11/29/2024 1638 by Estelle Nat SAILOR, RN Outcome: Progressing Goal: Ability to maintain adequate ventilation will improve 11/29/2024 1638 by Estelle Nat SAILOR, RN Outcome: Progressing 11/29/2024 1638 by Estelle Nat SAILOR, RN Outcome: Progressing

## 2024-11-29 NOTE — Progress Notes (Signed)
 PROGRESS NOTE   HPI was taken from NP Foust: Corey House. is a 58 y.o. year old male with past medical history of  tobacco use disorder reporting a 41 year pack per year history, smoking ~1 pack per day since he was 58 years old. He denies any additional medical history though he has not seen a medical provider in ~20 years. He presents to [facility] ED with shortness of breath that began ~ 9 months ago and worsened ~3 months ago. He reports today his dyspnea is not significantly different than it was 3 months ago. He endorses a chronic cough that has been present since childhood. Her ports the cough is currently, minimally productive with white phlegm. Additionally, he endorses bilateral lower extremity swelling that began in the last 2 days, per his report (R) > (L). On exam the swelling is equal bilaterally. He endorses fatigue and dyspnea that is worse with position changes such as bending over and when standing from lying. He denies chest pain, palpitations, or fever.   ED Course: On arrival to [facility] ED patient was noted to be afebrile temp 36.8C, BP 142/111, HR 88, RR 22, SPO2 100% on 15L non-rebreather. Patient now on 2L via nasal cannula with SPO2 96%. CXR obtained and shows non specific findings of increased interstitial markings. CTA PE obtained and shows no PE, dilated RV and reflux of contract into the IVC and hepatic vein consistent with (R) heart failure, chronic bronchitis, and emphysema. US  DVT (B)LE negative for DVT. Labs notable Negative COVID, flu, RSV,for BNP 284.2, BUN 24, troponin 41--> 38 with no report of chest pain.  Lactic acid 0.9.  Blood cultures drawn and in process.  He was given Lasix , DuoNebs, and Solu-Medrol .  [facility] contacted for admission.  12/13: Needed dose of anxiety medicine last night per nursing 12/14: Off Xanax , cutting back on Excedrin  from 2 tablets to 1    [name].  FMW:[id number] DOB: 10-11-1966 DOA: 10/20/2024 PCP:  Patient, [name]   Assessment & Plan:   Principal Problem:   Acute respiratory failure with hypoxia and hypercapnia (HCC) Active Problems:   COPD with acute exacerbation (HCC)   Acute on chronic diastolic CHF (congestive heart failure) (HCC)   Tobacco use disorder   Headache   Increased anion gap metabolic acidosis   Hypernatremia   Protein-calorie malnutrition, severe   Depression  Assessment and Plan: Acute hypoxic& hypercapnic respiratory failure on chronic hypoxic respiratory failure: continue on supplemental oxygen and wean as tolerated. Bronchodilators prn & encourage incentive spirometry.  Unable to tolerate BiPAP.  Pt is homeless. Cannot go to shelter with oxygen. Repeat oxygen desaturation test which showed pt still needs supplemental oxygen. Acute respiratory failure resolved, now  COPD exacerbation: resolved, completed abx and steroid course. Continue on bronchodilators & encourage incentive spirometry     Acute on chronic diastolic CHF: echo showed EF 55-60%. Monitor I/Os. Holding diuretics    Tobacco use disorder: received smoking cessation counseling already     Anion gap metabolic acidosis: resolved   Hypernatremia: resolved   Depression: severity unknown. Continue on seroquel  & increased dose of lexapro    Anxiety: we have been giving xanax  3x daily for about a month now. Not on this med at home. Not an appropriate med for long-term anxiolysis. Will make daily prn for 3 days (today day 3) and then plan to discontinue.       DVT prophylaxis: lovenox   Code Status: ful  Family Communication:  none at bedside Disposition Plan: medically stable. Waiting on safe d/c plan per toc  Level of care: Telemetry  Status is: Inpatient Remains inpatient appropriate because: medically stable. Waiting on safe d/c plan    Subjective:  No new issues.  Objective: Vitals:   11/28/24 1700 11/28/24 2050 11/29/24 0449 11/29/24 0859  BP: 105/79 103/76 104/73 106/80   Pulse: 75 80 75 70  Resp: 20   18  Temp: 98.3 F (36.8 C) 98 F (36.7 C) 98 F (36.7 C) 98.5 F (36.9 C)  TempSrc:  Oral Oral   SpO2: 97% 97% 99% 99%  Weight:   57.2 kg   Height:       No intake or output data in the 24 hours ending 11/29/24 1137   Filed Weights   11/27/24 0600 11/28/24 0500 11/29/24 0449  Weight: 57.9 kg 57.5 kg 57.2 kg    Examination:  General exam: Appears comfortable  Respiratory system: diminished breath sounds , no wheeze or rales Cardiovascular system: S1 & S2+ Gastrointestinal system: abd is soft, NT  Central nervous system: alert & oriented. Moves all extremities  Psychiatry: Judgement and insight appears at baseline. Flat mood and affect    Scheduled Meds:  enoxaparin  (LOVENOX ) injection  40 mg Subcutaneous Q24H   escitalopram   20 mg Oral Daily   guaiFENesin   600 mg Oral BID   multivitamin with minerals  1 tablet Oral Daily   nicotine   14 mg Transdermal Daily   pantoprazole   40 mg Oral Daily   QUEtiapine   25 mg Oral QHS   umeclidinium-vilanterol  1 puff Inhalation Daily   Continuous Infusions:   LOS: 40 days  Time spent 15 minutes  [name], MD [facility]  If 7PM-7AM, please contact night-coverage [email or web address]  11/29/2024, 11:37 AM

## 2024-11-30 LAB — CBC
HCT: 43 % (ref 39.0–52.0)
Hemoglobin: 13.9 g/dL (ref 13.0–17.0)
MCH: 29.3 pg (ref 26.0–34.0)
MCHC: 32.3 g/dL (ref 30.0–36.0)
MCV: 90.5 fL (ref 80.0–100.0)
Platelets: 310 K/uL (ref 150–400)
RBC: 4.75 MIL/uL (ref 4.22–5.81)
RDW: 12.8 % (ref 11.5–15.5)
WBC: 5.6 K/uL (ref 4.0–10.5)
nRBC: 0 % (ref 0.0–0.2)

## 2024-11-30 LAB — BASIC METABOLIC PANEL WITH GFR
Anion gap: 10 (ref 5–15)
BUN: 17 mg/dL (ref 6–20)
CO2: 32 mmol/L (ref 22–32)
Calcium: 9.8 mg/dL (ref 8.9–10.3)
Chloride: 101 mmol/L (ref 98–111)
Creatinine, Ser: 0.79 mg/dL (ref 0.61–1.24)
GFR, Estimated: >60 mL/min (ref >60)
Glucose, Bld: 130 mg/dL — ABNORMAL HIGH (ref 70–99)
Potassium: 3.5 mmol/L (ref 3.5–5.1)
Sodium: 142 mmol/L (ref 135–145)

## 2024-11-30 NOTE — Progress Notes (Signed)
 PROGRESS NOTE   HPI was taken from NP Foust: Corey House. is a 58 y.o. year old male with past medical history of  tobacco use disorder reporting a 41 year pack per year history, smoking ~1 pack per day since he was 58 years old. He denies any additional medical history though he has not seen a medical provider in ~20 years. He presents to [facility] ED with shortness of breath that began ~ 9 months ago and worsened ~3 months ago. He reports today his dyspnea is not significantly different than it was 3 months ago. He endorses a chronic cough that has been present since childhood. Her ports the cough is currently, minimally productive with white phlegm. Additionally, he endorses bilateral lower extremity swelling that began in the last 2 days, per his report (R) > (L). On exam the swelling is equal bilaterally. He endorses fatigue and dyspnea that is worse with position changes such as bending over and when standing from lying. He denies chest pain, palpitations, or fever.   ED Course: On arrival to [facility] ED patient was noted to be afebrile temp 36.8C, BP 142/111, HR 88, RR 22, SPO2 100% on 15L non-rebreather. Patient now on 2L via nasal cannula with SPO2 96%. CXR obtained and shows non specific findings of increased interstitial markings. CTA PE obtained and shows no PE, dilated RV and reflux of contract into the IVC and hepatic vein consistent with (R) heart failure, chronic bronchitis, and emphysema. US  DVT (B)LE negative for DVT. Labs notable Negative COVID, flu, RSV,for BNP 284.2, BUN 24, troponin 41--> 38 with no report of chest pain.  Lactic acid 0.9.  Blood cultures drawn and in process.  He was given Lasix , DuoNebs, and Solu-Medrol .  [facility] contacted for admission.  12/13: Needed dose of anxiety medicine last night per nursing 12/14: Off Xanax , cutting back on Excedrin  from 2 tablets to 1 12/15: Had to restart Xanax  as he became anxious    Corey House.   FMW:[id number] DOB: 1966/08/07 DOA: 10/20/2024 PCP: Patient, [name]   Assessment & Plan:   Principal Problem:   Acute respiratory failure with hypoxia and hypercapnia (HCC) Active Problems:   COPD with acute exacerbation (HCC)   Acute on chronic diastolic CHF (congestive heart failure) (HCC)   Tobacco use disorder   Headache   Increased anion gap metabolic acidosis   Hypernatremia   Protein-calorie malnutrition, severe   Depression  Assessment and Plan: Acute hypoxic& hypercapnic respiratory failure on chronic hypoxic respiratory failure: continue on supplemental oxygen and wean as tolerated. Bronchodilators prn & encourage incentive spirometry.  Unable to tolerate BiPAP.  Pt is homeless. Cannot go to shelter with oxygen. Repeat oxygen desaturation test which showed pt still needs supplemental oxygen. Acute respiratory failure resolved, now  COPD exacerbation: resolved, completed abx and steroid course. Continue on bronchodilators & encourage incentive spirometry     Acute on chronic diastolic CHF: echo showed EF 55-60%. Monitor I/Os. Holding diuretics    Tobacco use disorder: received smoking cessation counseling already     Anion gap metabolic acidosis: resolved   Hypernatremia: resolved   Depression: severity unknown. Continue on seroquel  & increased dose of lexapro    Anxiety: Had to resume Xanax  on 12/14 as he became anxious      DVT prophylaxis: lovenox   Code Status: ful  Family Communication: none at bedside Disposition Plan: medically stable. Waiting on safe d/c plan per toc  Level of care: Telemetry  Status is:  Inpatient Remains inpatient appropriate because: medically stable. Waiting on safe d/c plan    Subjective:  No new issues.  Objective: Vitals:   11/29/24 1740 11/29/24 2145 11/30/24 0528 11/30/24 0738  BP: 110/75 108/74 113/81 109/84  Pulse: 78 79 81 74  Resp: 16 16 14 18   Temp: 98.9 F (37.2 C) 98.9 F (37.2 C) 98 F (36.7 C) 97.7 F  (36.5 C)  TempSrc: Oral Oral Oral Oral  SpO2: 97%  98% 96%  Weight:   56.2 kg   Height:        Intake/Output Summary (Last 24 hours) at 11/30/2024 1220 Last data filed at 11/30/2024 1000 Gross per 24 hour  Intake 480 ml  Output --  Net 480 ml     Filed Weights   11/28/24 0500 11/29/24 0449 11/30/24 0528  Weight: 57.5 kg 57.2 kg 56.2 kg    Examination:  General exam: Appears comfortable  Respiratory system: diminished breath sounds , no wheeze or rales Cardiovascular system: S1 & S2+ Gastrointestinal system: abd is soft, NT  Central nervous system: alert & oriented. Moves all extremities  Psychiatry: Normal mood and affect    Scheduled Meds:  enoxaparin  (LOVENOX ) injection  40 mg Subcutaneous Q24H   escitalopram   20 mg Oral Daily   guaiFENesin   600 mg Oral BID   multivitamin with minerals  1 tablet Oral Daily   nicotine   14 mg Transdermal Daily   pantoprazole   40 mg Oral Daily   QUEtiapine   25 mg Oral QHS   umeclidinium-vilanterol  1 puff Inhalation Daily   Continuous Infusions:   LOS: 41 days  Time spent 15 minutes  Corey Fairly, MD [facility]  If 7PM-7AM, please contact night-coverage [email or web address]  11/30/2024, 12:20 PM

## 2024-11-30 NOTE — TOC Progression Note (Signed)
 Transition of Care (TOC) - Progression Note    Patient Details  Name: Corey House. MRN: [id number] Date of Birth: 1966-04-21  Transition of Care [facility] CM/SW Contact  Daved JONETTA Hamilton, RN Phone Number: 11/30/2024, 3:27 PM  Clinical Narrative:     This CM received call from Zelvia with [facility] regarding patient placement. Zelvia advised that Nadeen Borrow would like to come and assess the patient for acceptance to [facility], [employer] is the parent company. Zelvia provided this CM with Keia's contact information to arrange for assessment date and time.   This CM spoke with Keia Clay, introduced self and role. Ms. Borrow verbalized availability for tomorrow 12/16 at 10:00am. This CM reviewed patient chart and do not see any planned procedures or events during that time, verbalized agreement for 12/16 at 10:00am and will notify care team.   Expected Discharge Plan:  (patient is currently homeless, unsure of discharge location at this time) Barriers to Discharge: Financial Resources               Expected Discharge Plan and Services   Discharge Planning Services: CM Consult   Living arrangements for the past 2 months: Homeless Expected Discharge Date: 11/03/24                                     Social Drivers of Health (SDOH) Interventions SDOH Screenings   Food Insecurity: Food Insecurity Present (10/20/2024)  Housing: High Risk (10/21/2024)  Transportation Needs: Unmet Transportation Needs (10/20/2024)  Utilities: At Risk (10/20/2024)  Alcohol  Screen: Low Risk (04/23/2024)  Social Connections: Unknown (04/23/2024)  Tobacco Use: High Risk (10/21/2024)    Readmission Risk Interventions     No data to display

## 2024-11-30 NOTE — Plan of Care (Signed)
   Problem: Education: Goal: Knowledge of General Education information will improve Description: Including pain rating scale, medication(s)/side effects and non-pharmacologic comfort measures Outcome: Progressing   Problem: Health Behavior/Discharge Planning: Goal: Ability to manage health-related needs will improve Outcome: Progressing   Problem: Clinical Measurements: Goal: Will remain free from infection Outcome: Progressing

## 2024-11-30 NOTE — Plan of Care (Signed)

## 2024-12-01 NOTE — Progress Notes (Signed)
 PROGRESS NOTE   HPI was taken from NP Foust: Corey House. is a 58 y.o. year old male with past medical history of  tobacco use disorder reporting a 41 year pack per year history, smoking ~1 pack per day since he was 58 years old. He denies any additional medical history though he has not seen a medical provider in ~20 years. He presents to [facility] ED with shortness of breath that began ~ 9 months ago and worsened ~3 months ago. He reports today his dyspnea is not significantly different than it was 3 months ago. He endorses a chronic cough that has been present since childhood. Her ports the cough is currently, minimally productive with white phlegm. Additionally, he endorses bilateral lower extremity swelling that began in the last 2 days, per his report (R) > (L). On exam the swelling is equal bilaterally. He endorses fatigue and dyspnea that is worse with position changes such as bending over and when standing from lying. He denies chest pain, palpitations, or fever.   ED Course: On arrival to [facility] ED patient was noted to be afebrile temp 36.8C, BP 142/111, HR 88, RR 22, SPO2 100% on 15L non-rebreather. Patient now on 2L via nasal cannula with SPO2 96%. CXR obtained and shows non specific findings of increased interstitial markings. CTA PE obtained and shows no PE, dilated RV and reflux of contract into the IVC and hepatic vein consistent with (R) heart failure, chronic bronchitis, and emphysema. US  DVT (B)LE negative for DVT. Labs notable Negative COVID, flu, RSV,for BNP 284.2, BUN 24, troponin 41--> 38 with no report of chest pain.  Lactic acid 0.9.  Blood cultures drawn and in process.  He was given Lasix , DuoNebs, and Solu-Medrol .  [facility] contacted for admission.  12/13: Needed dose of anxiety medicine last night per nursing 12/14: Off Xanax , cutting back on Excedrin  from 2 tablets to 1 12/15: Had to restart Xanax  as he became anxious 12/16: Waiting for  placement    Corey House.  FMW:[id number] DOB: [birth year] DOA: 10/20/2024 PCP: Patient, [name]   Assessment & Plan:   Principal Problem:   Acute respiratory failure with hypoxia and hypercapnia (HCC) Active Problems:   COPD with acute exacerbation (HCC)   Acute on chronic diastolic CHF (congestive heart failure) (HCC)   Tobacco use disorder   Headache   Increased anion gap metabolic acidosis   Hypernatremia   Protein-calorie malnutrition, severe   Depression  Assessment and Plan: Acute hypoxic& hypercapnic respiratory failure on chronic hypoxic respiratory failure: continue on supplemental oxygen and wean as tolerated. Bronchodilators prn & encourage incentive spirometry.  Unable to tolerate BiPAP.  Pt is homeless. Cannot go to shelter with oxygen. Repeat oxygen desaturation test which showed pt still needs supplemental oxygen. Acute respiratory failure resolved, now  COPD exacerbation: resolved, completed abx and steroid course. Continue on bronchodilators & encourage incentive spirometry     Acute on chronic diastolic CHF: echo showed EF 55-60%. Monitor I/Os. Holding diuretics    Tobacco use disorder: received smoking cessation counseling already     Anion gap metabolic acidosis: resolved   Hypernatremia: resolved   Depression: severity unknown. Continue on seroquel  & increased dose of lexapro    Anxiety: Had to resume Xanax  on 12/14 as he became anxious      DVT prophylaxis: lovenox   Code Status: ful  Family Communication: none at bedside Disposition Plan: medically stable. Waiting on safe d/c plan per toc  Level of care:  Telemetry  Status is: Inpatient Remains inpatient appropriate because: medically stable. Waiting on safe d/c plan    Subjective:  No new issues.  Objective: Vitals:   12/01/24 0426 12/01/24 0427 12/01/24 0831 12/01/24 1614  BP:  108/72 116/86 115/82  Pulse:  77 91 78  Resp:  17 16 16   Temp:  97.7 F (36.5 C) 97.8 F (36.6  C) 98 F (36.7 C)  TempSrc:      SpO2:  97% 94% 94%  Weight: 56.2 kg     Height:        Intake/Output Summary (Last 24 hours) at 12/01/2024 1725 Last data filed at 12/01/2024 0954 Gross per 24 hour  Intake 480 ml  Output --  Net 480 ml     Filed Weights   11/29/24 0449 11/30/24 0528 12/01/24 0426  Weight: 57.2 kg 56.2 kg 56.2 kg    Examination:  General exam: Appears comfortable  Respiratory system: diminished breath sounds , no wheeze or rales Cardiovascular system: S1 & S2+ Gastrointestinal system: abd is soft, NT  Central nervous system: alert & oriented. Moves all extremities  Psychiatry: Normal mood and affect    Scheduled Meds:  enoxaparin  (LOVENOX ) injection  40 mg Subcutaneous Q24H   escitalopram   20 mg Oral Daily   guaiFENesin   600 mg Oral BID   multivitamin with minerals  1 tablet Oral Daily   nicotine   14 mg Transdermal Daily   pantoprazole   40 mg Oral Daily   QUEtiapine   25 mg Oral QHS   umeclidinium-vilanterol  1 puff Inhalation Daily   Continuous Infusions:   LOS: 42 days  Time spent 15 minutes  Corey Fairly, MD [facility]  If 7PM-7AM, please contact night-coverage [email or web address]  12/01/2024, 5:25 PM

## 2024-12-01 NOTE — Plan of Care (Signed)
  Problem: Clinical Measurements: Goal: Will remain free from infection Outcome: Progressing Goal: Respiratory complications will improve Outcome: Progressing Goal: Cardiovascular complication will be avoided Outcome: Progressing   Problem: Activity: Goal: Risk for activity intolerance will decrease Outcome: Progressing   Problem: Nutrition: Goal: Adequate nutrition will be maintained Outcome: Progressing   Problem: Elimination: Goal: Will not experience complications related to bowel motility Outcome: Progressing Goal: Will not experience complications related to urinary retention Outcome: Progressing   Problem: Pain Managment: Goal: General experience of comfort will improve and/or be controlled Outcome: Progressing   Problem: Safety: Goal: Ability to remain free from injury will improve Outcome: Progressing   Problem: Skin Integrity: Goal: Risk for impaired skin integrity will decrease Outcome: Progressing

## 2024-12-01 NOTE — Plan of Care (Signed)

## 2024-12-01 NOTE — TOC Progression Note (Signed)
 Transition of Care (TOC) - Progression Note    Patient Details  Name: Corey House. MRN: [id number] Date of Birth: [birth year]  Transition of Care [facility]) CM/SW Contact  [name], RN Phone Number: 12/01/2024, 11:18 AM  Clinical Narrative:    CM spoke with Nadeen from [employer], she is not able to come at this time to assess patient she thought that [employer] was arranging a Engineer, maintenance for all of us  to meet together.  She will be able to come and assess in person she will reach out to Zelvia with [employer] just to make sure that is okay with her.  She will update the entire team via email.    Expected Discharge Plan:  (patient is currently homeless, unsure of discharge location at this time) Barriers to Discharge: Financial Resources               Expected Discharge Plan and Services   Discharge Planning Services: CM Consult   Living arrangements for the past 2 months: Homeless Expected Discharge Date: 11/03/24                                     Social Drivers of Health (SDOH) Interventions SDOH Screenings   Food Insecurity: Food Insecurity Present (10/20/2024)  Housing: High Risk (10/21/2024)  Transportation Needs: Unmet Transportation Needs (10/20/2024)  Utilities: At Risk (10/20/2024)  Alcohol  Screen: Low Risk (04/23/2024)  Social Connections: Unknown (04/23/2024)  Tobacco Use: High Risk (10/21/2024)    Readmission Risk Interventions     No data to display

## 2024-12-02 DIAGNOSIS — E870 Hyperosmolality and hypernatremia: Secondary | ICD-10-CM | POA: Diagnosis not present

## 2024-12-02 DIAGNOSIS — I5033 Acute on chronic diastolic (congestive) heart failure: Secondary | ICD-10-CM | POA: Diagnosis not present

## 2024-12-02 DIAGNOSIS — J9621 Acute and chronic respiratory failure with hypoxia: Secondary | ICD-10-CM | POA: Diagnosis not present

## 2024-12-02 DIAGNOSIS — E43 Unspecified severe protein-calorie malnutrition: Secondary | ICD-10-CM | POA: Diagnosis not present

## 2024-12-02 DIAGNOSIS — J9622 Acute and chronic respiratory failure with hypercapnia: Secondary | ICD-10-CM

## 2024-12-02 DIAGNOSIS — L089 Local infection of the skin and subcutaneous tissue, unspecified: Secondary | ICD-10-CM | POA: Diagnosis not present

## 2024-12-02 DIAGNOSIS — E8729 Other acidosis: Secondary | ICD-10-CM | POA: Diagnosis not present

## 2024-12-02 DIAGNOSIS — J441 Chronic obstructive pulmonary disease with (acute) exacerbation: Secondary | ICD-10-CM | POA: Diagnosis not present

## 2024-12-02 DIAGNOSIS — F17200 Nicotine dependence, unspecified, uncomplicated: Secondary | ICD-10-CM | POA: Diagnosis not present

## 2024-12-02 DIAGNOSIS — F3289 Other specified depressive episodes: Secondary | ICD-10-CM | POA: Diagnosis not present

## 2024-12-02 DIAGNOSIS — J439 Emphysema, unspecified: Secondary | ICD-10-CM | POA: Diagnosis not present

## 2024-12-02 NOTE — Plan of Care (Signed)

## 2024-12-02 NOTE — Assessment & Plan Note (Signed)
-   On NicoDerm patch

## 2024-12-02 NOTE — Assessment & Plan Note (Addendum)
 Multiple attempts were made to wean the patient from oxygen during the hospital course.  Pulse ox drops with ambulation on room air (down to 84% on 1/13).  Patient unable to tolerate BiPAP.  Currently on 2 L.

## 2024-12-02 NOTE — Assessment & Plan Note (Signed)
Treated during the hospital course 

## 2024-12-02 NOTE — Assessment & Plan Note (Signed)
 Continue supplements

## 2024-12-02 NOTE — Progress Notes (Signed)
 Progress Note   Patient: Corey House. FMW:[id number] DOB: 03/22/1966 DOA: 10/20/2024     43 DOS: the patient was seen and examined on 12/02/2024   Brief hospital course: 58 y.o. year old male with past medical history of  tobacco use disorder reporting a 41 year pack per year history, smoking ~1 pack per day since he was 58 years old. He denies any additional medical history though he has not seen a medical provider in ~20 years. He presents to [facility] ED with shortness of breath that began ~ 9 months ago and worsened ~3 months ago. He reports today his dyspnea is not significantly different than it was 3 months ago. He endorses a chronic cough that has been present since childhood. Her ports the cough is currently, minimally productive with white phlegm. Additionally, he endorses bilateral lower extremity swelling that began in the last 2 days, per his report (R) > (L). On exam the swelling is equal bilaterally. He endorses fatigue and dyspnea that is worse with position changes such as bending over and when standing from lying. He denies chest pain, palpitations, or fever.   ED Course: On arrival to [facility] ED patient was noted to be afebrile temp 36.8C, BP 142/111, HR 88, RR 22, SPO2 100% on 15L non-rebreather. Patient now on 2L via nasal cannula with SPO2 96%. CXR obtained and shows non specific findings of increased interstitial markings. CTA PE obtained and shows no PE, dilated RV and reflux of contract into the IVC and hepatic vein consistent with (R) heart failure, chronic bronchitis, and emphysema. US  DVT (B)LE negative for DVT. Labs notable Negative COVID, flu, RSV,for BNP 284.2, BUN 24, troponin 41--> 38 with no report of chest pain.  Lactic acid 0.9.  Blood cultures drawn and in process.  He was given Lasix , DuoNebs, and Solu-Medrol .  TRH contacted for admission.   12/13: Needed dose of anxiety medicine last night per nursing 12/14: Off Xanax , cutting back on  Excedrin  from 2 tablets to 1 12/15: Had to restart Xanax  as  Assessment and Plan: * Acute on chronic respiratory failure with hypoxia and hypercapnia (HCC) Multiple attempts were made to wean the patient from oxygen during the hospital.  Pulse ox drops with ambulation.  Patient unable to tolerate BiPAP.  Intermittently will check pulse ox on room air to see if we can get off oxygen.  COPD with acute exacerbation (HCC) Treated during the hospital course  Acute on chronic diastolic CHF (congestive heart failure) (HCC) EF 55 to 60%.  Off Lasix  at this time and stable  Tobacco use disorder On NicoDerm patch  Headache Improved.  Tylenol  as needed.  IV magnesium  given on 11/5.  Increased anion gap metabolic acidosis Resolved  Hypernatremia Resolved  Depression On Seroquel  at nights as needed Xanax  and Lexapro .  Protein-calorie malnutrition, severe Continue supplements        Subjective: Patient states his anxiety has been high since being in the hospital but a bit more anxious as outpatient on the street.  Admitted with COPD exacerbation  Physical Exam: Vitals:   12/01/24 1942 12/02/24 0452 12/02/24 0500 12/02/24 0724  BP: 107/86 120/81  105/80  Pulse: 79 79  80  Resp: 18   17  Temp: 98 F (36.7 C) 97.7 F (36.5 C)  98.7 F (37.1 C)  TempSrc: Oral   Oral  SpO2: 96% 95%  100%  Weight:   56.4 kg   Height:       Physical  Exam HENT:     Head: Normocephalic.     Mouth/Throat:     Pharynx: No oropharyngeal exudate.  Eyes:     General: Lids are normal.     Conjunctiva/sclera: Conjunctivae normal.  Cardiovascular:     Rate and Rhythm: Normal rate and regular rhythm.     Heart sounds: Normal heart sounds, S1 normal and S2 normal.  Pulmonary:     Breath sounds: Examination of the right-lower field reveals decreased breath sounds. Examination of the left-lower field reveals decreased breath sounds. Decreased breath sounds present. No wheezing, rhonchi or rales.   Abdominal:     Palpations: Abdomen is soft.     Tenderness: There is no abdominal tenderness.  Musculoskeletal:     Right lower leg: No swelling.     Left lower leg: No swelling.  Skin:    General: Skin is warm.     Findings: No rash.  Neurological:     Mental Status: He is alert and oriented to person, place, and time.     Data Reviewed: Last creatinine 0.7 Last white blood cell count 5.6 last hemoglobin 13.9  Disposition: Status is: Inpatient Remains inpatient appropriate because: Unfortunately patient is homeless and is requiring oxygen.  We do not have a safe disposition  Planned Discharge Destination: Will need placement    Time spent: 27 minutes  Author: Charlie Patterson, MD 12/02/2024 12:19 PM  For on call review [email or web address].

## 2024-12-02 NOTE — Assessment & Plan Note (Signed)
 Resolved

## 2024-12-02 NOTE — Assessment & Plan Note (Signed)
 EF 55 to 60%.  Off Lasix  at this time and stable

## 2024-12-03 DIAGNOSIS — I5033 Acute on chronic diastolic (congestive) heart failure: Secondary | ICD-10-CM | POA: Diagnosis not present

## 2024-12-03 DIAGNOSIS — J9621 Acute and chronic respiratory failure with hypoxia: Secondary | ICD-10-CM | POA: Diagnosis not present

## 2024-12-03 DIAGNOSIS — F17200 Nicotine dependence, unspecified, uncomplicated: Secondary | ICD-10-CM | POA: Diagnosis not present

## 2024-12-03 DIAGNOSIS — J441 Chronic obstructive pulmonary disease with (acute) exacerbation: Secondary | ICD-10-CM | POA: Diagnosis not present

## 2024-12-03 MED ORDER — DOXYCYCLINE HYCLATE 100 MG PO TABS
100.0000 mg | ORAL_TABLET | Freq: Two times a day (BID) | ORAL | Status: AC
  Administered 2024-12-03 – 2024-12-09 (×14): 100 mg via ORAL
  Filled 2024-12-03 (×14): qty 1

## 2024-12-03 NOTE — Progress Notes (Signed)
 Mobility Specialist - Progress Note On O2 @ 2 L  Pre-mobility: HR,SpO2-93%  During mobility: HR, SpO2-85% recovered to 89% in < 1 min 30 sec  Post-mobility: HR, SPO2-92%   12/03/24 1551  Mobility  Activity Ambulated with assistance;Stood at bedside;Dangled on edge of bed  Level of Assistance Independent after set-up  Assistive Device None (O2 Stand)  Distance Ambulated (ft) 160 ft  Range of Motion/Exercises Active;All extremities  Mobility visit 1 Mobility  Mobility Specialist Start Time (ACUTE ONLY) 1426  Mobility Specialist Stop Time (ACUTE ONLY) 1441  Mobility Specialist Time Calculation (min) (ACUTE ONLY) 15 min   Pt was supine in bed on O2 @ 2 L upon entry. Pt agreed to mobility. Pt is able today to get to the EOB independently w/o bed features. Pt is able today to STS independently w/o AD. Pt O2 vitals were taken throughout activity as a precaution. Pt ambulated well. Pt did utilize recovery break to check vitals. Pt O2 did drop to 85% during activity but recovered with recovery break and PBT. After recovery pt returned to the room back in bed with needs in reach.  Clem Rodes Mobility Specialist 12/03/2024, 3:58 PM

## 2024-12-03 NOTE — TOC Progression Note (Signed)
 Transition of Care (TOC) - Progression Note    Patient Details  Name: Corey House. MRN: [id number] Date of Birth: [birth year]  Transition of Care [facility]) CM/SW Contact  [name], RN Phone Number: 12/03/2024, 2:58 PM  Clinical Narrative:    Reached out to Nadeen from [facility], she reports that she has been really busy but that she thinks she could come for an in person visit either tomorrow or Monday.  She has some things to finish up today and then will let me know a time for tomorrow or Monday.  Francine ordered an ID for the patient from the [facility] today.  [facility] emailed a payment Rate Chart for the care home to review.    Expected Discharge Plan:  (patient is currently homeless, unsure of discharge location at this time) Barriers to Discharge: Financial Resources               Expected Discharge Plan and Services   Discharge Planning Services: CM Consult   Living arrangements for the past 2 months: Homeless Expected Discharge Date: 11/03/24                                     Social Drivers of Health (SDOH) Interventions SDOH Screenings   Food Insecurity: Food Insecurity Present (10/20/2024)  Housing: High Risk (10/21/2024)  Transportation Needs: Unmet Transportation Needs (10/20/2024)  Utilities: At Risk (10/20/2024)  Alcohol  Screen: Low Risk (04/23/2024)  Social Connections: Unknown (04/23/2024)  Tobacco Use: High Risk (10/21/2024)    Readmission Risk Interventions     No data to display

## 2024-12-03 NOTE — Plan of Care (Signed)

## 2024-12-03 NOTE — TOC Progression Note (Signed)
 Transition of Care (TOC) - Progression Note    Patient Details  Name: Corey House. MRN: [id number] Date of Birth: [birth year]  Transition of Care [facility]) CM/SW Contact  Delphine KANDICE Bring, RN Phone Number: 12/03/2024, 12:59 PM  Clinical Narrative:    CM spoke with [name] at [facility]. She states that patient will be able to renew his license online. [name] assisted the CM in completing his renewal. Patient was in agreement with  CM completing renewal.  Patient's license will be mailed to [address] [facility] in [place] which is the [facility]  CM called [name] [phone] ext [phone]  to follow up on his disability application. CM had to leave a message.    Expected Discharge Plan:  (patient is currently homeless, unsure of discharge location at this time) Barriers to Discharge: Financial Resources               Expected Discharge Plan and Services   Discharge Planning Services: CM Consult   Living arrangements for the past 2 months: Homeless Expected Discharge Date: 11/03/24                                     Social Drivers of Health (SDOH) Interventions SDOH Screenings   Food Insecurity: Food Insecurity Present (10/20/2024)  Housing: High Risk (10/21/2024)  Transportation Needs: Unmet Transportation Needs (10/20/2024)  Utilities: At Risk (10/20/2024)  Alcohol  Screen: Low Risk (04/23/2024)  Social Connections: Unknown (04/23/2024)  Tobacco Use: High Risk (10/21/2024)    Readmission Risk Interventions     No data to display

## 2024-12-03 NOTE — Assessment & Plan Note (Deleted)
 Improved.  Tylenol  as needed.  IV magnesium  given on 11/5.

## 2024-12-03 NOTE — Progress Notes (Signed)
 Progress Note   Patient: Corey House. FMW:[id number] DOB: Oct 16, 1966 DOA: 10/20/2024     44 DOS: the patient was seen and examined on 12/03/2024   Brief hospital course: 58 y.o. year old male with past medical history of  tobacco use disorder reporting a 41 year pack per year history, smoking ~1 pack per day since he was 58 years old. He denies any additional medical history though he has not seen a medical provider in ~20 years. He presents to [facility] ED with shortness of breath that began ~ 9 months ago and worsened ~3 months ago. He reports today his dyspnea is not significantly different than it was 3 months ago. He endorses a chronic cough that has been present since childhood. Her ports the cough is currently, minimally productive with white phlegm. Additionally, he endorses bilateral lower extremity swelling that began in the last 2 days, per his report (R) > (L). On exam the swelling is equal bilaterally. He endorses fatigue and dyspnea that is worse with position changes such as bending over and when standing from lying. He denies chest pain, palpitations, or fever.   ED Course: On arrival to [facility] ED patient was noted to be afebrile temp 36.8C, BP 142/111, HR 88, RR 22, SPO2 100% on 15L non-rebreather. Patient now on 2L via nasal cannula with SPO2 96%. CXR obtained and shows non specific findings of increased interstitial markings. CTA PE obtained and shows no PE, dilated RV and reflux of contract into the IVC and hepatic vein consistent with (R) heart failure, chronic bronchitis, and emphysema. US  DVT (B)LE negative for DVT. Labs notable Negative COVID, flu, RSV,for BNP 284.2, BUN 24, troponin 41--> 38 with no report of chest pain.  Lactic acid 0.9.  Blood cultures drawn and in process.  He was given Lasix , DuoNebs, and Solu-Medrol .  TRH contacted for admission.   12/13: Needed dose of anxiety medicine last night per nursing 12/14: Off Xanax , cutting back on  Excedrin  from 2 tablets to 1 12/15: Had to restart Xanax  as  Assessment and Plan: * Acute on chronic respiratory failure with hypoxia and hypercapnia (HCC) Multiple attempts were made to wean the patient from oxygen during the hospital.  Pulse ox drops with ambulation.  Patient unable to tolerate BiPAP.  Intermittently will check pulse ox on room air to see if we can get off oxygen.  Currently on 2.5 L.  COPD with acute exacerbation (HCC) Treated during the hospital course  Acute on chronic diastolic CHF (congestive heart failure) (HCC) EF 55 to 60%.  Off Lasix  at this time and stable  Tobacco use disorder On NicoDerm patch  Headache Improved.  Tylenol  as needed.  IV magnesium  given on 11/5.  Increased anion gap metabolic acidosis Resolved  Hypernatremia Resolved  Depression On Seroquel  at night as needed Xanax  and Lexapro .  Protein-calorie malnutrition, severe Continue supplements  Pustule seen on nose.  Start doxycycline .      Subjective: Patient feeling little tired.  Having a breakout on his nose last few days.  Admitted with shortness of breath and COPD exacerbation.  Physical Exam: Vitals:   12/02/24 2035 12/03/24 0421 12/03/24 0457 12/03/24 0756  BP: 123/76  108/76 106/60  Pulse: 92  92 86  Resp: 16  16 16   Temp: 98.3 F (36.8 C)  98 F (36.7 C) (!) 97.5 F (36.4 C)  TempSrc:   Oral   SpO2: 94%  94% 96%  Weight:  56.9 kg  Height:       Physical Exam HENT:     Head: Normocephalic.     Mouth/Throat:     Pharynx: No oropharyngeal exudate.  Eyes:     General: Lids are normal.     Conjunctiva/sclera: Conjunctivae normal.  Cardiovascular:     Rate and Rhythm: Normal rate and regular rhythm.     Heart sounds: Normal heart sounds, S1 normal and S2 normal.  Pulmonary:     Breath sounds: Examination of the right-lower field reveals decreased breath sounds. Examination of the left-lower field reveals decreased breath sounds. Decreased breath sounds  present. No wheezing, rhonchi or rales.  Abdominal:     Palpations: Abdomen is soft.     Tenderness: There is no abdominal tenderness.  Musculoskeletal:     Right lower leg: No swelling.     Left lower leg: No swelling.  Skin:    General: Skin is warm.     Comments: Some pustules seen on nose.  Neurological:     Mental Status: He is alert and oriented to person, place, and time.     Data Reviewed: No new data   Disposition: Status is: Inpatient Remains inpatient appropriate because: We do not have a safe disposition because he is homeless and now requiring oxygen  Planned Discharge Destination: To be determined    Time spent: 26 minutes  Author: Charlie Patterson, MD 12/03/2024 12:19 PM  For on call review [email or web address].

## 2024-12-04 DIAGNOSIS — L089 Local infection of the skin and subcutaneous tissue, unspecified: Secondary | ICD-10-CM | POA: Insufficient documentation

## 2024-12-04 DIAGNOSIS — I5033 Acute on chronic diastolic (congestive) heart failure: Secondary | ICD-10-CM | POA: Diagnosis not present

## 2024-12-04 DIAGNOSIS — F17200 Nicotine dependence, unspecified, uncomplicated: Secondary | ICD-10-CM | POA: Diagnosis not present

## 2024-12-04 DIAGNOSIS — J441 Chronic obstructive pulmonary disease with (acute) exacerbation: Secondary | ICD-10-CM | POA: Diagnosis not present

## 2024-12-04 DIAGNOSIS — J9621 Acute and chronic respiratory failure with hypoxia: Secondary | ICD-10-CM | POA: Diagnosis not present

## 2024-12-04 NOTE — Assessment & Plan Note (Deleted)
 Resolved

## 2024-12-04 NOTE — Progress Notes (Signed)
 " Progress Note   Patient: Corey House. FMW:[id number] DOB: [birth year] DOA: 10/20/2024     45 DOS: the patient was seen and examined on 12/04/2024   Brief hospital course: 58 y.o. year old male with past medical history of  tobacco use disorder reporting a 41 year pack per year history, smoking ~1 pack per day since he was 58 years old. He denies any additional medical history though he has not seen a medical provider in ~20 years. He presents to [facility] ED with shortness of breath that began ~ 9 months ago and worsened ~3 months ago. He reports today his dyspnea is not significantly different than it was 3 months ago. He endorses a chronic cough that has been present since childhood. Her ports the cough is currently, minimally productive with white phlegm. Additionally, he endorses bilateral lower extremity swelling that began in the last 2 days, per his report (R) > (L). On exam the swelling is equal bilaterally. He endorses fatigue and dyspnea that is worse with position changes such as bending over and when standing from lying. He denies chest pain, palpitations, or fever.   ED Course: On arrival to [facility] ED patient was noted to be afebrile temp 36.8C, BP 142/111, HR 88, RR 22, SPO2 100% on 15L non-rebreather. Patient now on 2L via nasal cannula with SPO2 96%. CXR obtained and shows non specific findings of increased interstitial markings. CTA PE obtained and shows no PE, dilated RV and reflux of contract into the IVC and hepatic vein consistent with (R) heart failure, chronic bronchitis, and emphysema. US  DVT (B)LE negative for DVT. Labs notable Negative COVID, flu, RSV,for BNP 284.2, BUN 24, troponin 41--> 38 with no report of chest pain.  Lactic acid 0.9.  Blood cultures drawn and in process.  He was given Lasix , DuoNebs, and Solu-Medrol .  TRH contacted for admission.   12/13: Needed dose of anxiety medicine last night per nursing 12/14: Off Xanax , cutting back on  Excedrin  from 2 tablets to 1 12/15: Had to restart Xanax  as 12/18.  Patient having a breakout on his nose will start doxycycline .  Assessment and Plan: * Acute on chronic respiratory failure with hypoxia and hypercapnia (HCC) Multiple attempts were made to wean the patient from oxygen during the hospital.  Pulse ox drops with ambulation.  Patient unable to tolerate BiPAP.  Intermittently will check pulse ox on room air to see if we can get off oxygen (yesterday pulse ox dropped down to 85% with ambulation on room air).  Currently on 3 L.    COPD with acute exacerbation (HCC) Treated during the hospital course  Acute on chronic diastolic CHF (congestive heart failure) (HCC) EF 55 to 60%.  Off Lasix  at this time and stable  Tobacco use disorder On NicoDerm patch  Headache Improved.  Tylenol  as needed.  IV magnesium  given on 11/5.  Increased anion gap metabolic acidosis Resolved  Hypernatremia Resolved  Skin pustule Started doxycycline  on 12/18.  ANCA sent.  Depression On Seroquel  at night as needed Xanax  and Lexapro .  Protein-calorie malnutrition, severe Continue supplements        Subjective: Patient feels a little tired.  Had a breakout on his nose like a pimple with persisting oxygen.  Physical Exam: Vitals:   12/03/24 1550 12/03/24 1925 12/04/24 0500 12/04/24 0547  BP: 110/77 97/77  104/80  Pulse: 87 87  94  Resp: 18 20  20   Temp: 98.2 F (36.8 C) 98.5 F (36.9 C)  98.8 F (37.1 C)  TempSrc:  Oral  Oral  SpO2: 94% 94%  90%  Weight:   60 kg   Height:       Physical Exam HENT:     Head: Normocephalic.     Mouth/Throat:     Pharynx: No oropharyngeal exudate.  Eyes:     General: Lids are normal.     Conjunctiva/sclera: Conjunctivae normal.  Cardiovascular:     Rate and Rhythm: Normal rate and regular rhythm.     Heart sounds: Normal heart sounds, S1 normal and S2 normal.  Pulmonary:     Breath sounds: Examination of the right-lower field reveals  decreased breath sounds. Examination of the left-lower field reveals decreased breath sounds. Decreased breath sounds present. No wheezing, rhonchi or rales.  Abdominal:     Palpations: Abdomen is soft.     Tenderness: There is no abdominal tenderness.  Musculoskeletal:     Right lower leg: No swelling.     Left lower leg: No swelling.  Skin:    General: Skin is warm.     Comments: Some pustules seen on nose.  Neurological:     Mental Status: He is alert and oriented to person, place, and time.     Data Reviewed: No new data today  Disposition: Status is: Inpatient Remains inpatient appropriate because: Looks like they are working on setting up an ID for the patient but hopefully a place for the patient to be.  Unfortunately patient is homeless and is requiring oxygen.  Planned Discharge Destination: Will need a roof over his head and electricity.    Time spent: 28 minutes  Author: Charlie Patterson, MD 12/04/2024 12:09 PM  For on call review [email or web address].  "

## 2024-12-05 DIAGNOSIS — J441 Chronic obstructive pulmonary disease with (acute) exacerbation: Secondary | ICD-10-CM | POA: Diagnosis not present

## 2024-12-05 DIAGNOSIS — J439 Emphysema, unspecified: Secondary | ICD-10-CM

## 2024-12-05 DIAGNOSIS — J9621 Acute and chronic respiratory failure with hypoxia: Secondary | ICD-10-CM | POA: Diagnosis not present

## 2024-12-05 DIAGNOSIS — I5033 Acute on chronic diastolic (congestive) heart failure: Secondary | ICD-10-CM | POA: Diagnosis not present

## 2024-12-05 MED ORDER — METRONIDAZOLE 0.75 % EX GEL
Freq: Two times a day (BID) | CUTANEOUS | Status: DC
  Administered 2024-12-05 – 2024-12-07 (×2): 1 via TOPICAL
  Filled 2024-12-05 (×2): qty 45

## 2024-12-05 NOTE — Progress Notes (Signed)
 " Progress Note   Patient: Corey House. FMW:[id number] DOB: April 06, 1966 DOA: 10/20/2024     58 DOS: the patient was seen and examined on 12/05/2024   Brief hospital course: 58 y.o. year old male with past medical history of  tobacco use disorder reporting a 41 year pack per year history, smoking ~1 pack per day since he was 58 years old. He denies any additional medical history though he has not seen a medical provider in ~20 years. He presents to [facility] ED with shortness of breath that began ~ 9 months ago and worsened ~3 months ago. He reports today his dyspnea is not significantly different than it was 3 months ago. He endorses a chronic cough that has been present since childhood. Her ports the cough is currently, minimally productive with white phlegm. Additionally, he endorses bilateral lower extremity swelling that began in the last 2 days, per his report (R) > (L). On exam the swelling is equal bilaterally. He endorses fatigue and dyspnea that is worse with position changes such as bending over and when standing from lying. He denies chest pain, palpitations, or fever.   ED Course: On arrival to [facility] ED patient was noted to be afebrile temp 36.8C, BP 142/111, HR 88, RR 22, SPO2 100% on 15L non-rebreather. Patient now on 2L via nasal cannula with SPO2 96%. CXR obtained and shows non specific findings of increased interstitial markings. CTA PE obtained and shows no PE, dilated RV and reflux of contract into the IVC and hepatic vein consistent with (R) heart failure, chronic bronchitis, and emphysema. US  DVT (B)LE negative for DVT. Labs notable Negative COVID, flu, RSV,for BNP 284.2, BUN 24, troponin 41--> 38 with no report of chest pain.  Lactic acid 0.9.  Blood cultures drawn and in process.  He was given Lasix , DuoNebs, and Solu-Medrol .  [facility] contacted for admission.   12/13: Needed dose of anxiety medicine last night per nursing 12/14: Off Xanax , cutting back on  Excedrin  from 2 tablets to 1 12/15: Had to restart Xanax  as 12/18.  Patient having a breakout on his nose will start doxycycline . 12/20.  Start MetroGel  for nose.  Assessment and Plan: * Acute on chronic respiratory failure with hypoxia and hypercapnia (HCC) Multiple attempts were made to wean the patient from oxygen during the hospital.  Pulse ox drops with ambulation (down to 85% the other day).  Patient unable to tolerate BiPAP.  Currently on 2.5 L.   COPD with acute exacerbation (HCC) Treated during the hospital course  Acute on chronic diastolic CHF (congestive heart failure) (HCC) EF 55 to 60%.  Off Lasix  at this time and stable  Tobacco use disorder On NicoDerm patch  Headache Improved.  Tylenol  as needed.  IV magnesium  given on 11/5.  Increased anion gap metabolic acidosis Resolved  Hypernatremia Resolved  Skin pustule Started doxycycline  on 12/18.  ANCA sent.  Add MetroGel .  Depression On Seroquel  at night as needed Xanax  and Lexapro .  Protein-calorie malnutrition, severe Continue supplements        Subjective: Patient feels okay.  Offers no complaints.  Awaiting disposition plan.  Admitted 46 days ago with shortness of breath COPD exacerbation  Physical Exam: Vitals:   12/04/24 2140 12/05/24 0607 12/05/24 1015 12/05/24 1408  BP: 116/74 102/66 108/78 114/73  Pulse: 91 99 92 90  Resp:  17 17 17   Temp: 98.6 F (37 C) 98 F (36.7 C) 97.7 F (36.5 C) 97.9 F (36.6 C)  TempSrc:  Oral Oral Oral  SpO2: 94% 93% 93% 97%  Weight:      Height:       Physical Exam HENT:     Head: Normocephalic.     Mouth/Throat:     Pharynx: No oropharyngeal exudate.  Eyes:     General: Lids are normal.     Conjunctiva/sclera: Conjunctivae normal.  Cardiovascular:     Rate and Rhythm: Normal rate and regular rhythm.     Heart sounds: Normal heart sounds, S1 normal and S2 normal.  Pulmonary:     Breath sounds: Examination of the right-lower field reveals decreased  breath sounds. Examination of the left-lower field reveals decreased breath sounds. Decreased breath sounds present. No wheezing, rhonchi or rales.  Abdominal:     Palpations: Abdomen is soft.     Tenderness: There is no abdominal tenderness.  Musculoskeletal:     Right lower leg: No swelling.     Left lower leg: No swelling.  Skin:    General: Skin is warm.     Comments: Some pustules seen on nose.  Neurological:     Mental Status: He is alert and oriented to person, place, and time.     Data Reviewed: Last creatinine 0.79, potassium 3.5  Disposition: Status is: Inpatient Remains inpatient appropriate because: Patient is homeless and requires oxygen to go home with.  Unfortunately we will have to find a place for him to go  Planned Discharge Destination: Will need some sort of placement    Time spent: 26 minutes  Author: Charlie Patterson, MD 12/05/2024 3:00 PM  For on call review [email or web address].  "

## 2024-12-05 NOTE — Plan of Care (Signed)
" °  Problem: Activity: Goal: Risk for activity intolerance will decrease Outcome: Progressing   Problem: Nutrition: Goal: Adequate nutrition will be maintained Outcome: Progressing   Problem: Coping: Goal: Level of anxiety will decrease Outcome: Progressing   Problem: Elimination: Goal: Will not experience complications related to bowel motility Outcome: Progressing Goal: Will not experience complications related to urinary retention Outcome: Progressing   Problem: Pain Managment: Goal: General experience of comfort will improve and/or be controlled Outcome: Progressing   Problem: Safety: Goal: Ability to remain free from injury will improve Outcome: Progressing   Problem: Skin Integrity: Goal: Risk for impaired skin integrity will decrease Outcome: Progressing   Problem: Activity: Goal: Ability to tolerate increased activity will improve Outcome: Progressing Goal: Will verbalize the importance of balancing activity with adequate rest periods Outcome: Progressing   Problem: Respiratory: Goal: Ability to maintain a clear airway will improve Outcome: Progressing Goal: Ability to maintain adequate ventilation will improve Outcome: Progressing   "

## 2024-12-06 DIAGNOSIS — J9621 Acute and chronic respiratory failure with hypoxia: Secondary | ICD-10-CM | POA: Diagnosis not present

## 2024-12-06 DIAGNOSIS — I5033 Acute on chronic diastolic (congestive) heart failure: Secondary | ICD-10-CM | POA: Diagnosis not present

## 2024-12-06 DIAGNOSIS — F17200 Nicotine dependence, unspecified, uncomplicated: Secondary | ICD-10-CM | POA: Diagnosis not present

## 2024-12-06 DIAGNOSIS — J441 Chronic obstructive pulmonary disease with (acute) exacerbation: Secondary | ICD-10-CM | POA: Diagnosis not present

## 2024-12-06 NOTE — Plan of Care (Signed)
  Problem: Pain Managment: Goal: General experience of comfort will improve and/or be controlled Outcome: Progressing   Problem: Safety: Goal: Ability to remain free from injury will improve Outcome: Progressing

## 2024-12-06 NOTE — Progress Notes (Signed)
 " Progress Note   Patient: Corey House. FMW:[id number] DOB: 1966-09-15 DOA: 10/20/2024     47 DOS: the patient was seen and examined on 12/06/2024   Brief hospital course: 58 y.o. year old male with past medical history of  tobacco use disorder reporting a 41 year pack per year history, smoking ~1 pack per day since he was 58 years old. He denies any additional medical history though he has not seen a medical provider in ~20 years. He presents to [facility] ED with shortness of breath that began ~ 9 months ago and worsened ~3 months ago. He reports today his dyspnea is not significantly different than it was 3 months ago. He endorses a chronic cough that has been present since childhood. Her ports the cough is currently, minimally productive with white phlegm. Additionally, he endorses bilateral lower extremity swelling that began in the last 2 days, per his report (R) > (L). On exam the swelling is equal bilaterally. He endorses fatigue and dyspnea that is worse with position changes such as bending over and when standing from lying. He denies chest pain, palpitations, or fever.   ED Course: On arrival to [facility] ED patient was noted to be afebrile temp 36.8C, BP 142/111, HR 88, RR 22, SPO2 100% on 15L non-rebreather. Patient now on 2L via nasal cannula with SPO2 96%. CXR obtained and shows non specific findings of increased interstitial markings. CTA PE obtained and shows no PE, dilated RV and reflux of contract into the IVC and hepatic vein consistent with (R) heart failure, chronic bronchitis, and emphysema. US  DVT (B)LE negative for DVT. Labs notable Negative COVID, flu, RSV,for BNP 284.2, BUN 24, troponin 41--> 38 with no report of chest pain.  Lactic acid 0.9.  Blood cultures drawn and in process.  He was given Lasix , DuoNebs, and Solu-Medrol .  [facility] contacted for admission.   12/13: Needed dose of anxiety medicine last night per nursing 12/14: Off Xanax , cutting back on  Excedrin  from 2 tablets to 1 12/15: Had to restart Xanax  as 12/18.  Patient having a breakout on his nose will start doxycycline . 12/20.  Start MetroGel  for nose.  Assessment and Plan: * Acute on chronic respiratory failure with hypoxia and hypercapnia (HCC) Multiple attempts were made to wean the patient from oxygen during the hospital.  Pulse ox drops with ambulation (down to 85% the other day).  Patient unable to tolerate BiPAP.  Currently on 2.5 L.  COPD with acute exacerbation (HCC) Treated during the hospital course  Acute on chronic diastolic CHF (congestive heart failure) (HCC) EF 55 to 60%.  Off Lasix  at this time and stable  Tobacco use disorder On NicoDerm patch  Headache Improved.  Tylenol  as needed.  IV magnesium  given on 11/5.  Increased anion gap metabolic acidosis Resolved  Hypernatremia Resolved  Skin pustule Started doxycycline  on 12/18.  ANCA sent.  Add MetroGel .  Depression On Seroquel  at night as needed Xanax  and Lexapro .  Protein-calorie malnutrition, severe Continue supplements        Subjective: Patient did not sleep well last night.  Tired today.  Did not feel like getting up.  Admitted 47 days ago with acute respiratory failure and shortness of breath  Physical Exam: Vitals:   12/05/24 1920 12/06/24 0418 12/06/24 0439 12/06/24 0736  BP: 107/76 100/72  97/74  Pulse: 92 82  85  Resp: 18 18  16   Temp: 98.5 F (36.9 C) 97.7 F (36.5 C)  97.9 F (36.6  C)  TempSrc: Oral Oral  Oral  SpO2: 94% 95%  93%  Weight:   60 kg   Height:       Physical Exam HENT:     Head: Normocephalic.  Eyes:     General: Lids are normal.     Conjunctiva/sclera: Conjunctivae normal.  Cardiovascular:     Rate and Rhythm: Normal rate and regular rhythm.     Heart sounds: Normal heart sounds, S1 normal and S2 normal.  Pulmonary:     Breath sounds: Examination of the right-lower field reveals decreased breath sounds. Examination of the left-lower field  reveals decreased breath sounds. Decreased breath sounds present. No wheezing, rhonchi or rales.  Abdominal:     Palpations: Abdomen is soft.     Tenderness: There is no abdominal tenderness.  Musculoskeletal:     Right lower leg: No swelling.     Left lower leg: No swelling.  Skin:    General: Skin is warm.     Comments: Some pustules seen on nose.  Neurological:     Mental Status: He is alert and oriented to person, place, and time.     Data Reviewed: No new data   Disposition: Status is: Inpatient Remains inpatient appropriate because: Unsafe disposition since he is homeless and requiring home oxygen  Planned Discharge Destination: Will need some sort of placement    Time spent: 26 minutes  Author: Charlie Patterson, MD 12/06/2024 12:13 PM  For on call review [email or web address].  "

## 2024-12-07 DIAGNOSIS — E870 Hyperosmolality and hypernatremia: Secondary | ICD-10-CM | POA: Diagnosis not present

## 2024-12-07 DIAGNOSIS — J9621 Acute and chronic respiratory failure with hypoxia: Secondary | ICD-10-CM | POA: Diagnosis not present

## 2024-12-07 DIAGNOSIS — F3289 Other specified depressive episodes: Secondary | ICD-10-CM | POA: Diagnosis not present

## 2024-12-07 DIAGNOSIS — J9622 Acute and chronic respiratory failure with hypercapnia: Secondary | ICD-10-CM | POA: Diagnosis not present

## 2024-12-07 DIAGNOSIS — I5033 Acute on chronic diastolic (congestive) heart failure: Secondary | ICD-10-CM | POA: Diagnosis not present

## 2024-12-07 DIAGNOSIS — E8729 Other acidosis: Secondary | ICD-10-CM | POA: Diagnosis not present

## 2024-12-07 DIAGNOSIS — L089 Local infection of the skin and subcutaneous tissue, unspecified: Secondary | ICD-10-CM | POA: Diagnosis not present

## 2024-12-07 DIAGNOSIS — F17200 Nicotine dependence, unspecified, uncomplicated: Secondary | ICD-10-CM | POA: Diagnosis not present

## 2024-12-07 DIAGNOSIS — E43 Unspecified severe protein-calorie malnutrition: Secondary | ICD-10-CM | POA: Diagnosis not present

## 2024-12-07 DIAGNOSIS — J441 Chronic obstructive pulmonary disease with (acute) exacerbation: Secondary | ICD-10-CM | POA: Diagnosis not present

## 2024-12-07 LAB — BASIC METABOLIC PANEL WITH GFR
Anion gap: 14 (ref 5–15)
BUN: 17 mg/dL (ref 6–20)
CO2: 30 mmol/L (ref 22–32)
Calcium: 9.3 mg/dL (ref 8.9–10.3)
Chloride: 101 mmol/L (ref 98–111)
Creatinine, Ser: 0.76 mg/dL (ref 0.61–1.24)
GFR, Estimated: >60 mL/min
Glucose, Bld: 132 mg/dL — ABNORMAL HIGH (ref 70–99)
Potassium: 4.5 mmol/L (ref 3.5–5.1)
Sodium: 145 mmol/L (ref 135–145)

## 2024-12-07 LAB — BLOOD GAS, VENOUS
Acid-Base Excess: 12 mmol/L — ABNORMAL HIGH (ref 0.0–2.0)
Bicarbonate: 40.2 mmol/L — ABNORMAL HIGH (ref 20.0–28.0)
O2 Saturation: 66.1 %
Patient temperature: 37
pCO2, Ven: 68 mmHg — ABNORMAL HIGH (ref 44–60)
pH, Ven: 7.38 (ref 7.25–7.43)
pO2, Ven: 39 mmHg (ref 32–45)

## 2024-12-07 LAB — CBC
HCT: 41.6 % (ref 39.0–52.0)
Hemoglobin: 13.3 g/dL (ref 13.0–17.0)
MCH: 29 pg (ref 26.0–34.0)
MCHC: 32 g/dL (ref 30.0–36.0)
MCV: 90.8 fL (ref 80.0–100.0)
Platelets: 268 K/uL (ref 150–400)
RBC: 4.58 MIL/uL (ref 4.22–5.81)
RDW: 12.9 % (ref 11.5–15.5)
WBC: 6 K/uL (ref 4.0–10.5)
nRBC: 0 % (ref 0.0–0.2)

## 2024-12-07 LAB — ANCA PROFILE
Anti-MPO Antibodies: <0.2 U (ref 0.0–0.9)
Anti-PR3 Antibodies: <0.2 U (ref 0.0–0.9)
Atypical P-ANCA titer: <1:20 {titer}
C-ANCA: <1:20 {titer}
P-ANCA: <1:20 {titer}

## 2024-12-07 MED ORDER — BUPROPION HCL ER (XL) 150 MG PO TB24
150.0000 mg | ORAL_TABLET | Freq: Every day | ORAL | Status: DC
  Administered 2024-12-07 – 2024-12-14 (×8): 150 mg via ORAL
  Filled 2024-12-07 (×7): qty 1

## 2024-12-07 NOTE — Plan of Care (Signed)
  Problem: Education: Goal: Knowledge of General Education information will improve Description: Including pain rating scale, medication(s)/side effects and non-pharmacologic comfort measures Outcome: Progressing   Problem: Health Behavior/Discharge Planning: Goal: Ability to manage health-related needs will improve Outcome: Progressing   Problem: Clinical Measurements: Goal: Ability to maintain clinical measurements within normal limits will improve Outcome: Progressing Goal: Will remain free from infection Outcome: Progressing Goal: Diagnostic test results will improve Outcome: Progressing Goal: Respiratory complications will improve Outcome: Progressing Goal: Cardiovascular complication will be avoided Outcome: Progressing   Problem: Activity: Goal: Risk for activity intolerance will decrease Outcome: Progressing   Problem: Nutrition: Goal: Adequate nutrition will be maintained Outcome: Progressing   Problem: Elimination: Goal: Will not experience complications related to bowel motility Outcome: Progressing Goal: Will not experience complications related to urinary retention Outcome: Progressing   Problem: Pain Managment: Goal: General experience of comfort will improve and/or be controlled Outcome: Progressing   Problem: Safety: Goal: Ability to remain free from injury will improve Outcome: Progressing   Problem: Skin Integrity: Goal: Risk for impaired skin integrity will decrease Outcome: Progressing   Problem: Education: Goal: Knowledge of disease or condition will improve Outcome: Progressing Goal: Knowledge of the prescribed therapeutic regimen will improve Outcome: Progressing Goal: Individualized Educational Video(s) Outcome: Progressing   Problem: Activity: Goal: Ability to tolerate increased activity will improve Outcome: Progressing Goal: Will verbalize the importance of balancing activity with adequate rest periods Outcome: Progressing    Problem: Respiratory: Goal: Ability to maintain a clear airway will improve Outcome: Progressing Goal: Levels of oxygenation will improve Outcome: Progressing Goal: Ability to maintain adequate ventilation will improve Outcome: Progressing

## 2024-12-07 NOTE — Progress Notes (Signed)
 " Progress Note   Patient: Corey House. FMW:[id number] DOB: 05/27/1966 DOA: 10/20/2024     48 DOS: the patient was seen and examined on 12/07/2024   Brief hospital course: 58 y.o. year old male with past medical history of  tobacco use disorder reporting a 41 year pack per year history, smoking ~1 pack per day since he was 58 years old. He denies any additional medical history though he has not seen a medical provider in ~20 years. He presents to [facility] ED with shortness of breath that began ~ 9 months ago and worsened ~3 months ago. He reports today his dyspnea is not significantly different than it was 3 months ago. He endorses a chronic cough that has been present since childhood. Her ports the cough is currently, minimally productive with white phlegm. Additionally, he endorses bilateral lower extremity swelling that began in the last 2 days, per his report (R) > (L). On exam the swelling is equal bilaterally. He endorses fatigue and dyspnea that is worse with position changes such as bending over and when standing from lying. He denies chest pain, palpitations, or fever.   ED Course: On arrival to [facility] ED patient was noted to be afebrile temp 36.8C, BP 142/111, HR 88, RR 22, SPO2 100% on 15L non-rebreather. Patient now on 2L via nasal cannula with SPO2 96%. CXR obtained and shows non specific findings of increased interstitial markings. CTA PE obtained and shows no PE, dilated RV and reflux of contract into the IVC and hepatic vein consistent with (R) heart failure, chronic bronchitis, and emphysema. US  DVT (B)LE negative for DVT. Labs notable Negative COVID, flu, RSV,for BNP 284.2, BUN 24, troponin 41--> 38 with no report of chest pain.  Lactic acid 0.9.  Blood cultures drawn and in process.  He was given Lasix , DuoNebs, and Solu-Medrol .  TRH contacted for admission.   12/13: Needed dose of anxiety medicine last night per nursing 12/14: Off Xanax , cutting back on  Excedrin  from 2 tablets to 1 12/15: Had to restart Xanax  as 12/18.  Patient having a breakout on his nose will start doxycycline . 12/20.  Start MetroGel  for nose. 12/22.  Patient seen the entire week and just lying in bed.  Patient states his depression is bad he just does not want to live.  Reconsulted psychiatry.  Started Wellbutrin   Assessment and Plan: * Depression On Seroquel  at night as needed Xanax  and Lexapro .  Will add Wellbutrin .  Consult psychiatry again since patient just lying in bed and states he does not want to live.  Laboratory data does not show reason for such fatigue.  Patient does have CO2 retention but cannot tolerate BiPAP.   Acute on chronic respiratory failure with hypoxia and hypercapnia (HCC) Multiple attempts were made to wean the patient from oxygen during the hospital.  Pulse ox drops with ambulation (down to 85% the other day).  Patient unable to tolerate BiPAP.  Currently on 2.5 L.  COPD with acute exacerbation (HCC) Treated during the hospital course  Acute on chronic diastolic CHF (congestive heart failure) (HCC) EF 55 to 60%.  Off Lasix  at this time and stable  Tobacco use disorder On NicoDerm patch  Headache Improved.  Tylenol  as needed.  IV magnesium  given on 11/5.  Increased anion gap metabolic acidosis Resolved  Hypernatremia Resolved  Skin pustule Started doxycycline  on 12/18.  ANCA sent.  Add MetroGel .  Protein-calorie malnutrition, severe Continue supplements        Subjective: Patient  does not feel like doing anything.  Feeling fatigued does not feel well.  States his depression is really bad he does not want to live.  Admitted 48 days ago with COPD exacerbation.  Physical Exam: Vitals:   12/06/24 0736 12/06/24 1947 12/07/24 0335 12/07/24 0827  BP: 97/74 109/78 109/84 110/80  Pulse: 85 83 78 78  Resp: 16   18  Temp: 97.9 F (36.6 C) 98.1 F (36.7 C) (!) 97.5 F (36.4 C) 98.4 F (36.9 C)  TempSrc: Oral Oral Oral    SpO2: 93% 96% 97% 97%  Weight:   58.3 kg   Height:       Physical Exam HENT:     Head: Normocephalic.  Eyes:     General: Lids are normal.     Conjunctiva/sclera: Conjunctivae normal.  Cardiovascular:     Rate and Rhythm: Normal rate and regular rhythm.     Heart sounds: Normal heart sounds, S1 normal and S2 normal.  Pulmonary:     Breath sounds: Examination of the right-lower field reveals decreased breath sounds. Examination of the left-lower field reveals decreased breath sounds. Decreased breath sounds present. No wheezing, rhonchi or rales.  Abdominal:     Palpations: Abdomen is soft.     Tenderness: There is no abdominal tenderness.  Musculoskeletal:     Right lower leg: No swelling.     Left lower leg: No swelling.  Skin:    General: Skin is warm.     Comments: Some pustules seen on nose.  Neurological:     Mental Status: He is alert.  Psychiatric:        Mood and Affect: Mood is depressed.     Data Reviewed: Venous blood gas shows a pH of 7.38 pCO2 of 68, creatinine 0.76, electrolytes normal range, CBC normal range    Disposition: Status is: Inpatient Remains inpatient appropriate because: Patient is homeless but requires chronic oxygen difficult situation.  Planned Discharge Destination: Will need some sort of placement    Time spent: 29 minutes  Author: Charlie Patterson, MD 12/07/2024 12:19 PM  For on call review [email or web address].  "

## 2024-12-08 DIAGNOSIS — I5033 Acute on chronic diastolic (congestive) heart failure: Secondary | ICD-10-CM | POA: Diagnosis not present

## 2024-12-08 DIAGNOSIS — J9621 Acute and chronic respiratory failure with hypoxia: Secondary | ICD-10-CM | POA: Diagnosis not present

## 2024-12-08 DIAGNOSIS — F3289 Other specified depressive episodes: Secondary | ICD-10-CM | POA: Diagnosis not present

## 2024-12-08 DIAGNOSIS — J441 Chronic obstructive pulmonary disease with (acute) exacerbation: Secondary | ICD-10-CM | POA: Diagnosis not present

## 2024-12-08 NOTE — Plan of Care (Signed)
  Problem: Education: Goal: Knowledge of General Education information will improve Description: Including pain rating scale, medication(s)/side effects and non-pharmacologic comfort measures Outcome: Progressing   Problem: Health Behavior/Discharge Planning: Goal: Ability to manage health-related needs will improve Outcome: Progressing   Problem: Clinical Measurements: Goal: Ability to maintain clinical measurements within normal limits will improve Outcome: Progressing Goal: Will remain free from infection Outcome: Progressing Goal: Diagnostic test results will improve Outcome: Progressing Goal: Respiratory complications will improve Outcome: Progressing Goal: Cardiovascular complication will be avoided Outcome: Progressing   Problem: Activity: Goal: Risk for activity intolerance will decrease Outcome: Progressing   Problem: Nutrition: Goal: Adequate nutrition will be maintained Outcome: Progressing   Problem: Coping: Goal: Level of anxiety will decrease Outcome: Progressing   Problem: Elimination: Goal: Will not experience complications related to bowel motility Outcome: Progressing Goal: Will not experience complications related to urinary retention Outcome: Progressing   Problem: Pain Managment: Goal: General experience of comfort will improve and/or be controlled Outcome: Progressing   Problem: Safety: Goal: Ability to remain free from injury will improve Outcome: Progressing   Problem: Skin Integrity: Goal: Risk for impaired skin integrity will decrease Outcome: Progressing   Problem: Education: Goal: Knowledge of disease or condition will improve Outcome: Progressing Goal: Knowledge of the prescribed therapeutic regimen will improve Outcome: Progressing Goal: Individualized Educational Video(s) Outcome: Progressing   Problem: Activity: Goal: Ability to tolerate increased activity will improve Outcome: Progressing Goal: Will verbalize the  importance of balancing activity with adequate rest periods Outcome: Progressing   Problem: Respiratory: Goal: Ability to maintain a clear airway will improve Outcome: Progressing Goal: Levels of oxygenation will improve Outcome: Progressing

## 2024-12-08 NOTE — Plan of Care (Signed)
" °  Problem: Education: Goal: Knowledge of General Education information will improve Description: Including pain rating scale, medication(s)/side effects and non-pharmacologic comfort measures Outcome: Progressing   Problem: Health Behavior/Discharge Planning: Goal: Ability to manage health-related needs will improve Outcome: Progressing   Problem: Clinical Measurements: Goal: Ability to maintain clinical measurements within normal limits will improve Outcome: Progressing Goal: Will remain free from infection Outcome: Progressing Goal: Diagnostic test results will improve Outcome: Progressing Goal: Respiratory complications will improve Outcome: Progressing Goal: Cardiovascular complication will be avoided Outcome: Progressing   Problem: Activity: Goal: Risk for activity intolerance will decrease Outcome: Progressing   Problem: Nutrition: Goal: Adequate nutrition will be maintained Outcome: Progressing   Problem: Coping: Goal: Level of anxiety will decrease Outcome: Progressing   Problem: Elimination: Goal: Will not experience complications related to bowel motility Outcome: Progressing Goal: Will not experience complications related to urinary retention Outcome: Progressing   Problem: Pain Managment: Goal: General experience of comfort will improve and/or be controlled Outcome: Progressing   Problem: Safety: Goal: Ability to remain free from injury will improve Outcome: Progressing   Problem: Skin Integrity: Goal: Risk for impaired skin integrity will decrease Outcome: Progressing   Problem: Education: Goal: Knowledge of disease or condition will improve Outcome: Progressing Goal: Knowledge of the prescribed therapeutic regimen will improve Outcome: Progressing Goal: Individualized Educational Video(s) Outcome: Progressing   Problem: Activity: Goal: Ability to tolerate increased activity will improve Outcome: Progressing Goal: Will verbalize the  importance of balancing activity with adequate rest periods Outcome: Progressing   Problem: Respiratory: Goal: Ability to maintain a clear airway will improve Outcome: Progressing   Problem: Respiratory: Goal: Levels of oxygenation will improve Outcome: Not Progressing Goal: Ability to maintain adequate ventilation will improve Outcome: Not Progressing   "

## 2024-12-08 NOTE — Consult Note (Signed)
 [facility] Psychiatric Consult Initial  Patient Name: .Corey House.  MRN: [id number]  DOB: March 02, 1966  Consult Order details:  Orders (From admission, onward)     Start     Ordered   12/07/24 1131  IP CONSULT TO PSYCHIATRY       Comments: Dr Josette savoy  Ordering Provider: Josette Ade, MD  Provider:  Lynnann Merle, NP  Question Answer Comment  Location [facility]   Reason for Consult? depression      12/07/24 1130   10/21/24 1005  IP CONSULT TO PSYCHIATRY       Comments: Dr Josette savoy  Ordering Provider: Josette Ade, MD  Provider:  Donnelly Mellow, MD  Question Answer Comment  Location [facility]   Reason for Consult? lost will to live      10/21/24 1005             Mode of Visit: In person    Psychiatry Consult Evaluation  Service Date: December 08, 2024 LOS:  LOS: 49 days  Chief Complaint I just had a bad day  Primary Psychiatric Diagnoses  Depression   Assessment   Deven Furia. is a 58 y.o. male admitted: Medicallyfor 10/20/2024 10:47 AM for shortness of breath . He carries the psychiatric diagnoses of Depression/anxiety and has a past medical history of  tobacco use disorder reporting a 41 year pack per year history, smoking ~1 pack per day since he was 58 years old. He denies any additional medical history though he has not seen a medical provider in ~20 years.     Patient is medically hospitalized with comorbid depression and anxiety, currently experiencing housing instability. He demonstrates fair insight and is engaged with treatment planning. Current medication regimen includes Lexapro  with recent addition of Wellbutrin  for augmentation and smoking cessation support. Psychiatry will continue to monitor patient throughout medical admission and follow response to newly initiated Wellbutrin . Will defer addition of BuSpar at this time per patient preference, allowing trial period to  assess Wellbutrin  efficacy. Will reach out to SW to assist with arrangement of appropriate outpatient mental health resources. Will continue to monitor tolerance to current medication regimen and assess for any emerging side effects. Psychiatry will continue to round.  Currently, patient denies suicidal or homicidal ideations.  Patient denies auditory or visual hallucinations as well.On current presentation there was no evidence of psychosis or mania and patient did not appear to be responding to internal stimuli. At this time, patient does not appear to be a risk to self or others.While future psychiatric events cannot be accurately predicted, the patient does not currently require acute inpatient psychiatric care and does not currently meet [place]  involuntary commitment criteria.     Diagnoses:  Active Hospital problems: Principal Problem:   Depression Active Problems:   COPD with acute exacerbation (HCC)   Tobacco use disorder   Protein-calorie malnutrition, severe   Acute on chronic diastolic CHF (congestive heart failure) (HCC)   Headache   Increased anion gap metabolic acidosis   Hypernatremia   Acute on chronic respiratory failure with hypoxia and hypercapnia (HCC)   Skin pustule   Emphysema lung (HCC)    Plan   ## Psychiatric Medication Recommendations:  Assess patient tolerance to Wellbutrin  before making further recommendations  ## Medical Decision Making Capacity: Not specifically addressed in this encounter  ## Further Work-up:   -- most recent EKG on 10/20/2024 had QtC of 506: new EKG ordered today for  Qtc monitoring  -- Pertinent labwork reviewed earlier this admission includes: CBC BMP   ## Disposition:-- There are no psychiatric contraindications to discharge at this time  ## Behavioral / Environmental: - No specific recommendations at this time.     ## Safety and Observation Level:  - Based on my clinical evaluation, I estimate the patient to be at low  risk of self harm in the current setting. - At this time, we recommend  routine. This decision is based on my review of the chart including patient's history and current presentation, interview of the patient, mental status examination, and consideration of suicide risk including evaluating suicidal ideation, plan, intent, suicidal or self-harm behaviors, risk factors, and protective factors. This judgment is based on our ability to directly address suicide risk, implement suicide prevention strategies, and develop a safety plan while the patient is in the clinical setting. Please contact our team if there is a concern that risk level has changed.  CSSR Risk Category:C-SSRS RISK CATEGORY: No Risk  Suicide Risk Assessment: Patient has following modifiable risk factors for suicide: lack of access to outpatient mental health resources, which we are addressing by involving social work to help provide appropriate resources. Patient has following non-modifiable or demographic risk factors for suicide: male gender Patient has the following protective factors against suicide: no history of suicide attempts and no history of NSSIB  Thank you for this consult request. Recommendations have been communicated to the primary team.  We will continue to be available as needed at this time.   Zelda Sharps, NP        History of Present Illness  Relevant Aspects of [facility]   Patient Report:  Psychiatry consulted by medical team. Patient reports doing all right today and states he was having one of them days yesterday but is feeling much better now. He endorses ongoing anxiety and reports previous treatment with Xanax , though his prior provider advised against long-term use due to concerns regarding increased risk of Alzheimer's disease and dementia. Patient states it is difficult to determine if his current Lexapro  regimen is effectively addressing his anxiety and depression. He recalls taking Paxil in  the past, which he believes was helpful, though he is uncertain given the length of time since he last took it. Patient reports the medical team initiated Wellbutrin  today to augment treatment for depression and anxiety as well as assist with smoking cessation, and he is agreeable to this plan. He is willing to wait and assess response to Wellbutrin  before considering addition of another agent such as BuSpar for anxiety management. Patient denies current suicidal or homicidal ideations, auditory or visual hallucinations, and any history of self-harm. He denies access to weapons and reports no current alcohol  or recreational drug use, though he continues to smoke cigarettes daily. This is his second recent hospitalization for respiratory problems. Patient is agreeable to establishing outpatient mental health resources and reports the medical team may be arranging placement in a group home, with preference for outpatient services located near that placement. Patient is alert and oriented to person, place, time, and situation. He presents as calm and cooperative with the psychiatry team. No objective signs of psychosis or mania observed, and patient does not appear to be responding to internal stimuli. He denies any noted side effects at this time, though Wellbutrin  was just recently initiated.  Psych ROS:  Depression: Denied Anxiety: Endorsed Mania (lifetime and current): Denied Psychosis: (lifetime and current): Denied  Collateral information:  No collateral  information provided by patient at this time.  Patient reported not having any local family or friends currently   Psychiatric and Social History  Psychiatric History:  Information collected from Patient/chart review  Prev Dx/Sx: Depression, anxiety Current Psych Provider: None Home Meds (current): Lexapro , seroquel , newly added wellbutrin  Previous Med Trials: Paxil Therapy: Denied  Prior Psych Hospitalization: Yes Prior Self Harm: Reported  history of suicidal thoughts, but denied any previous suicide attempts or self-harm Prior Violence: Denied   Family Psych History: Unknown Family Hx suicide: Unknown   Social History:   Educational Hx: Unknown Occupational Hx: Reported inability to work related to his chronic medical conditions-not currently receiving disability checks Legal Hx: Denied Living Situation: Reported living placed a place wherever I can.  Reported being homeless Spiritual Hx: Unknown Access to weapons/lethal means: Denied   Substance History Alcohol : Denied current use History of alcohol  withdrawal seizures denied History of DT's denied Tobacco: Daily cigarette use Illicit drugs: Denied Prescription drug abuse: Denied Rehab hx: Denied  Exam Findings  Physical Exam: Reviewed and agree with the physical exam findings conducted by the medical provider Vital Signs:  Temp:  [97.6 F (36.4 C)-98.6 F (37 C)] 97.6 F (36.4 C) (12/23 0840) Pulse Rate:  [74-85] 74 (12/23 0840) Resp:  [16-18] 18 (12/23 0840) BP: (97-117)/(70-84) 97/77 (12/23 0840) SpO2:  [94 %-97 %] 94 % (12/23 0840) Weight:  [58.3 kg] 58.3 kg (12/23 0519) Blood pressure 97/77, pulse 74, temperature 97.6 F (36.4 C), resp. rate 18, height 5' 4.02 (1.626 m), weight 58.3 kg, SpO2 94%. Body mass index is 22.05 kg/m.    Mental Status Exam: General Appearance: Casual  Orientation:  Full (Time, Place, and Person)  Memory:  Immediate;   Good Recent;   Good Remote;   Good  Concentration:  Concentration: Good  Recall:  Good  Attention  Good  Eye Contact:  Good  Speech:  Clear and Coherent  Language:  Good  Volume:  Normal  Mood: Anxious  Affect:  Congruent  Thought Process:  Coherent, Goal Directed, and Linear  Thought Content:  Logical  Suicidal Thoughts:  No  Homicidal Thoughts:  No  Judgement:  Fair  Insight:  Fair  Psychomotor Activity:  Normal  Akathisia:  No  Fund of Knowledge:  Fair      Assets:  Communication  Skills Desire for Improvement  Cognition:  WNL  ADL's:  Intact  AIMS (if indicated):        Other History   These have been pulled in through the EMR, reviewed, and updated if appropriate.  Family History:  The patient's family history is not on file.  Medical History: Past Medical History:  Diagnosis Date   Chronic cough    Urolithiasis    Urolithiasis     Surgical History: Past Surgical History:  Procedure Laterality Date   CATARACT EXTRACTION W/PHACO Left 08/09/2021   Procedure: CATARACT EXTRACTION PHACO AND INTRAOCULAR LENS PLACEMENT (IOC) LEFT .051 00:10.0;  Surgeon: Mittie Gaskin, MD;  Location: [facility];  Service: Ophthalmology;  Laterality: Left;   CATARACT EXTRACTION W/PHACO Right 10/04/2021   Procedure: CATARACT EXTRACTION PHACO AND INTRAOCULAR LENS PLACEMENT (IOC) RIGHT 0.67 00:13.8;  Surgeon: Mittie Gaskin, MD;  Location: [facility];  Service: Ophthalmology;  Laterality: Right;  general anesthesia needs to be last case   HERNIA REPAIR     x2   MANDIBLE FRACTURE SURGERY       Medications:  Current Medications[1]  Allergies: Allergies[2] Zelda Sharps, NP This  note was created using Scientist, clinical (histocompatibility and immunogenetics). Please excuse any inadvertent transcription errors. Case was discussed with supervising physician Dr. Jadapalle who is agreeable with current plan.       [1]  Current Facility-Administered Medications:    acetaminophen  (TYLENOL ) tablet 650 mg, 650 mg, Oral, Q6H PRN, Josette Ade, MD, 650 mg at 12/04/24 1045   acetaminophen -caffeine  (EXCEDRIN  TENSION HEADACHE) 500-65 MG per tablet 1 tablet, 1 tablet, Oral, Q8H PRN, Maree Hue, MD, 1 tablet at 12/01/24 1711   albuterol  (PROVENTIL ) (2.5 MG/3ML) 0.083% nebulizer solution 2.5 mg, 2.5 mg, Nebulization, Q2H PRN, Foust, Katy L, NP, 2.5 mg at 10/23/24 9176   ALPRAZolam  (XANAX ) tablet 0.25 mg, 0.25 mg, Oral, TID PRN, Maree Hue, MD, 0.25 mg at 12/08/24 0810   artificial  tears ophthalmic solution 1 drop, 1 drop, Both Eyes, PRN, Foust, Katy L, NP   buPROPion  (WELLBUTRIN  XL) 24 hr tablet 150 mg, 150 mg, Oral, Daily, Wieting, Richard, MD, 150 mg at 12/08/24 9192   doxycycline  (VIBRA -TABS) tablet 100 mg, 100 mg, Oral, Q12H, Wieting, Richard, MD, 100 mg at 12/08/24 9191   enoxaparin  (LOVENOX ) injection 40 mg, 40 mg, Subcutaneous, Q24H, Foust, Katy L, NP, 40 mg at 12/07/24 1712   escitalopram  (LEXAPRO ) tablet 20 mg, 20 mg, Oral, Daily, Amin, Sumayya, MD, 20 mg at 12/08/24 9192   guaiFENesin  (MUCINEX ) 12 hr tablet 600 mg, 600 mg, Oral, BID, Foust, Katy L, NP, 600 mg at 12/08/24 9191   loratadine  (CLARITIN ) tablet 10 mg, 10 mg, Oral, Daily PRN, Pokhrel, Laxman, MD, 10 mg at 11/21/24 9167   melatonin tablet 5 mg, 5 mg, Oral, QHS PRN, Foust, Katy L, NP, 5 mg at 12/07/24 2200   metroNIDAZOLE  (METROGEL ) 0.75 % gel, , Topical, BID, Wieting, Richard, MD, Given at 12/08/24 0809   multivitamin with minerals tablet 1 tablet, 1 tablet, Oral, Daily, Wieting, Richard, MD, 1 tablet at 12/08/24 9191   nicotine  (NICODERM CQ  - dosed in mg/24 hours) patch 14 mg, 14 mg, Transdermal, Daily, Foust, Katy L, NP, 14 mg at 12/08/24 0809   nicotine  polacrilex (NICORETTE ) gum 2 mg, 2 mg, Oral, PRN, Foust, Katy L, NP   Oral care mouth rinse, 15 mL, Mouth Rinse, PRN, Wieting, Richard, MD   pantoprazole  (PROTONIX ) EC tablet 40 mg, 40 mg, Oral, Daily, Foust, Katy L, NP, 40 mg at 12/08/24 0807   QUEtiapine  (SEROQUEL ) tablet 25 mg, 25 mg, Oral, QHS, Bartow Zylstra B, NP, 25 mg at 12/07/24 2200   sodium chloride  (OCEAN) 0.65 % nasal spray 1 spray, 1 spray, Each Nare, PRN, Patel, Sona, MD   umeclidinium-vilanterol (ANORO ELLIPTA ) 62.5-25 MCG/ACT 1 puff, 1 puff, Inhalation, Daily, Amin, Sumayya, MD, 1 puff at 12/08/24 0809 [2] No Known Allergies

## 2024-12-08 NOTE — Progress Notes (Signed)
 " Progress Note   Patient: Corey House. FMW:[id number] DOB: Jun 25, 1966 DOA: 10/20/2024     49 DOS: the patient was seen and examined on 12/08/2024   Brief hospital course: 58 y.o. year old male with past medical history of  tobacco use disorder reporting a 41 year pack per year history, smoking ~1 pack per day since he was 58 years old. He denies any additional medical history though he has not seen a medical provider in ~20 years. He presents to [facility] ED with shortness of breath that began ~ 9 months ago and worsened ~3 months ago. He reports today his dyspnea is not significantly different than it was 3 months ago. He endorses a chronic cough that has been present since childhood. Her ports the cough is currently, minimally productive with white phlegm. Additionally, he endorses bilateral lower extremity swelling that began in the last 2 days, per his report (R) > (L). On exam the swelling is equal bilaterally. He endorses fatigue and dyspnea that is worse with position changes such as bending over and when standing from lying. He denies chest pain, palpitations, or fever.   ED Course: On arrival to [facility] ED patient was noted to be afebrile temp 36.8C, BP 142/111, HR 88, RR 22, SPO2 100% on 15L non-rebreather. Patient now on 2L via nasal cannula with SPO2 96%. CXR obtained and shows non specific findings of increased interstitial markings. CTA PE obtained and shows no PE, dilated RV and reflux of contract into the IVC and hepatic vein consistent with (R) heart failure, chronic bronchitis, and emphysema. US  DVT (B)LE negative for DVT. Labs notable Negative COVID, flu, RSV,for BNP 284.2, BUN 24, troponin 41--> 38 with no report of chest pain.  Lactic acid 0.9.  Blood cultures drawn and in process.  He was given Lasix , DuoNebs, and Solu-Medrol .  TRH contacted for admission.   12/13: Needed dose of anxiety medicine last night per nursing 12/14: Off Xanax , cutting back on  Excedrin  from 2 tablets to 1 12/15: Had to restart Xanax  as 12/18.  Patient having a breakout on his nose will start doxycycline . 12/20.  Start MetroGel  for nose. 12/22.  Patient seen the entire week and just lying in bed.  Patient states his depression is bad he just does not want to live.  Reconsulted psychiatry.  Started Wellbutrin  12/23.  Feeling a little bit better today.  Assessment and Plan: * Depression On Seroquel  at night as needed Xanax  and Lexapro .  Added Wellbutrin  on 12/22.  Appreciate psychiatric reconsult.  Patient does have CO2 retention but cannot tolerate BiPAP.  Acute on chronic respiratory failure with hypoxia and hypercapnia (HCC) Multiple attempts were made to wean the patient from oxygen during the hospital.  Pulse ox drops with ambulation (down to 85% the other day).  Patient unable to tolerate BiPAP.  Currently on 2 L.  COPD with acute exacerbation (HCC) Treated during the hospital course  Acute on chronic diastolic CHF (congestive heart failure) (HCC) EF 55 to 60%.  Off Lasix  at this time and stable  Tobacco use disorder On NicoDerm patch  Headache Improved.  Tylenol  as needed.  IV magnesium  given on 11/5.  Increased anion gap metabolic acidosis Resolved  Hypernatremia Resolved  Skin pustule Started doxycycline  on 12/18.  ANCA profile negative.  Added MetroGel .  Protein-calorie malnutrition, severe Continue supplements        Subjective: Patient feeling a little bit better today.  States he will walk around today.  Was  really depressed yesterday.  Physical Exam: Vitals:   12/07/24 1644 12/07/24 2001 12/08/24 0519 12/08/24 0840  BP:  106/84 97/71 97/77   Pulse: 75 85 79 74  Resp: 16 16 18 18   Temp: 98.1 F (36.7 C) 98.6 F (37 C) 97.6 F (36.4 C) 97.6 F (36.4 C)  TempSrc: Oral Oral Oral   SpO2: 96% 94% 96% 94%  Weight:   58.3 kg   Height:       Physical Exam HENT:     Head: Normocephalic.  Eyes:     General: Lids are normal.      Conjunctiva/sclera: Conjunctivae normal.  Cardiovascular:     Rate and Rhythm: Normal rate and regular rhythm.     Heart sounds: Normal heart sounds, S1 normal and S2 normal.  Pulmonary:     Breath sounds: Examination of the right-lower field reveals decreased breath sounds. Examination of the left-lower field reveals decreased breath sounds. Decreased breath sounds present. No wheezing, rhonchi or rales.  Abdominal:     Palpations: Abdomen is soft.     Tenderness: There is no abdominal tenderness.  Musculoskeletal:     Right lower leg: No swelling.     Left lower leg: No swelling.  Skin:    General: Skin is warm.     Comments: Some pustules seen on nose.  Neurological:     Mental Status: He is alert.     Data Reviewed: Venous blood gas shows a pH of 7.38 and a pCO2 of 68, creatinine 0.76, CBC normal range   Disposition: Status is: Inpatient Remains inpatient appropriate because: Unfortunate situation that we are unable to get him off oxygen and he is homeless.  Planned Discharge Destination: May need some sort of placement    Time spent: 27 minutes Case discussed with psychiatry team  Author: Charlie Patterson, MD 12/08/2024 4:06 PM  For on call review [email or web address].  "

## 2024-12-09 DIAGNOSIS — J9621 Acute and chronic respiratory failure with hypoxia: Secondary | ICD-10-CM | POA: Diagnosis not present

## 2024-12-09 DIAGNOSIS — J441 Chronic obstructive pulmonary disease with (acute) exacerbation: Secondary | ICD-10-CM | POA: Diagnosis not present

## 2024-12-09 DIAGNOSIS — F32A Depression, unspecified: Secondary | ICD-10-CM | POA: Diagnosis not present

## 2024-12-09 DIAGNOSIS — I5033 Acute on chronic diastolic (congestive) heart failure: Secondary | ICD-10-CM | POA: Diagnosis not present

## 2024-12-09 DIAGNOSIS — J9622 Acute and chronic respiratory failure with hypercapnia: Secondary | ICD-10-CM | POA: Diagnosis not present

## 2024-12-09 NOTE — Progress Notes (Signed)
 " Progress Note   Patient: Corey House. FMW:[id number] DOB: 1966/10/05 DOA: 10/20/2024     50 DOS: the patient was seen and examined on 12/09/2024   Brief hospital course: 58 y.o. year old male with past medical history of  tobacco use disorder reporting a 41 year pack per year history, smoking ~1 pack per day since he was 58 years old. He denies any additional medical history though he has not seen a medical provider in ~20 years. He presents to [facility] ED with shortness of breath that began ~ 9 months ago and worsened ~3 months ago. He reports today his dyspnea is not significantly different than it was 3 months ago. He endorses a chronic cough that has been present since childhood. Her ports the cough is currently, minimally productive with white phlegm. Additionally, he endorses bilateral lower extremity swelling that began in the last 2 days, per his report (R) > (L). On exam the swelling is equal bilaterally. He endorses fatigue and dyspnea that is worse with position changes such as bending over and when standing from lying. He denies chest pain, palpitations, or fever.   ED Course: On arrival to [facility] ED patient was noted to be afebrile temp 36.8C, BP 142/111, HR 88, RR 22, SPO2 100% on 15L non-rebreather. Patient now on 2L via nasal cannula with SPO2 96%. CXR obtained and shows non specific findings of increased interstitial markings. CTA PE obtained and shows no PE, dilated RV and reflux of contract into the IVC and hepatic vein consistent with (R) heart failure, chronic bronchitis, and emphysema. US  DVT (B)LE negative for DVT. Labs notable Negative COVID, flu, RSV,for BNP 284.2, BUN 24, troponin 41--> 38 with no report of chest pain.  Lactic acid 0.9.  Blood cultures drawn and in process.  He was given Lasix , DuoNebs, and Solu-Medrol .    Patient was initially treated in the hospital, condition has improved.  Currently just pending for placement.   Principal  Problem:   Depression Active Problems:   Acute on chronic respiratory failure with hypoxia and hypercapnia (HCC)   COPD with acute exacerbation (HCC)   Acute on chronic diastolic CHF (congestive heart failure) (HCC)   Tobacco use disorder   Headache   Increased anion gap metabolic acidosis   Hypernatremia   Protein-calorie malnutrition, severe   Skin pustule   Emphysema lung (HCC)   Assessment and Plan: * Depression On Seroquel  at night as needed Xanax  and Lexapro .  Added Wellbutrin  on 12/22.    Acute respiratory failure with hypoxia and hypercapnia (HCC)-resolved as of 12/02/2024 Pulse ox dropped to 82% with ambulation yesterday.  Awaiting pulse ox from today.  The patient is homeless will need to be able to come off oxygen prior to disposition.  Continue to try to see if we can get off oxygen on a daily basis.  VBG shows a pCO2 of 81.  Patient unable to tolerate BiPAP.  The patient likely has acute on chronic respiratory failure.  - Continue to require oxygen, need oxygen for home and he is currently homeless, cannot go to a shelter with oxygen.  COPD with acute exacerbation (HCC) Treated during the hospital course  Acute on chronic diastolic CHF (congestive heart failure) (HCC) EF 55 to 60%.  Off Lasix  at this time and stable  Tobacco use disorder On NicoDerm patch  Headache Improved.  Tylenol  as needed.  IV magnesium  given on 11/5.  Increased anion gap metabolic acidosis Resolved  Hypernatremia Resolved  Skin pustule Started doxycycline  on 12/18.  ANCA profile negative.  Added MetroGel .  Protein-calorie malnutrition, severe Continue supplements       Subjective:  Patient feels better with depression, not short of breath.  Still requiring oxygen.  Physical Exam: Vitals:   12/08/24 1649 12/08/24 2117 12/09/24 0500 12/09/24 0732  BP: 112/82 97/69  108/69  Pulse: 79 86  78  Resp: 18 18  16   Temp: 97.6 F (36.4 C) 97.8 F (36.6 C)  97.6 F (36.4 C)   TempSrc:      SpO2: 93% 93%  94%  Weight:   58.9 kg   Height:       General exam: Appears calm and comfortable  Respiratory system: Decreased breath sounds. Respiratory effort normal. Cardiovascular system: S1 & S2 heard, RRR. No JVD, murmurs, rubs, gallops or clicks. No pedal edema. Gastrointestinal system: Abdomen is nondistended, soft and nontender. No organomegaly or masses felt. Normal bowel sounds heard. Central nervous system: Alert and oriented x3. No focal neurological deficits. Extremities: Symmetric 5 x 5 power. Skin: No rashes, lesions or ulcers Psychiatry: Judgement and insight appear normal. Mood & affect appropriate.    Data Reviewed:  Lab results reviewed  Family Communication: None  Disposition: Status is: Inpatient Remains inpatient appropriate because: Unsafe discharge, need placement     Time spent: 35 minutes  Author: Murvin Mana, MD 12/09/2024 2:04 PM  For on call review [email or web address].    "

## 2024-12-09 NOTE — Plan of Care (Signed)

## 2024-12-09 NOTE — Plan of Care (Signed)
" °  Problem: Education: Goal: Knowledge of General Education information will improve Description: Including pain rating scale, medication(s)/side effects and non-pharmacologic comfort measures Outcome: Progressing   Problem: Clinical Measurements: Goal: Respiratory complications will improve Outcome: Progressing   Problem: Activity: Goal: Risk for activity intolerance will decrease Outcome: Progressing   Problem: Nutrition: Goal: Adequate nutrition will be maintained Outcome: Progressing   Problem: Elimination: Goal: Will not experience complications related to bowel motility Outcome: Progressing   Problem: Elimination: Goal: Will not experience complications related to urinary retention Outcome: Progressing   Problem: Pain Managment: Goal: General experience of comfort will improve and/or be controlled Outcome: Progressing   "

## 2024-12-10 DIAGNOSIS — J9622 Acute and chronic respiratory failure with hypercapnia: Secondary | ICD-10-CM | POA: Diagnosis not present

## 2024-12-10 DIAGNOSIS — F32A Depression, unspecified: Secondary | ICD-10-CM | POA: Diagnosis not present

## 2024-12-10 DIAGNOSIS — J441 Chronic obstructive pulmonary disease with (acute) exacerbation: Secondary | ICD-10-CM | POA: Diagnosis not present

## 2024-12-10 DIAGNOSIS — J9621 Acute and chronic respiratory failure with hypoxia: Secondary | ICD-10-CM | POA: Diagnosis not present

## 2024-12-10 NOTE — Progress Notes (Signed)
 " Progress Note   Patient: Corey House. FMW:[id number] DOB: [birth year] DOA: 10/20/2024     51 DOS: the patient was seen and examined on 12/10/2024   Brief hospital course: 58 y.o. year old male with past medical history of  tobacco use disorder reporting a 41 year pack per year history, smoking ~1 pack per day since he was 58 years old. He denies any additional medical history though he has not seen a medical provider in ~20 years. He presents to [facility] ED with shortness of breath that began ~ 9 months ago and worsened ~3 months ago. He reports today his dyspnea is not significantly different than it was 3 months ago. He endorses a chronic cough that has been present since childhood. Her ports the cough is currently, minimally productive with white phlegm. Additionally, he endorses bilateral lower extremity swelling that began in the last 2 days, per his report (R) > (L). On exam the swelling is equal bilaterally. He endorses fatigue and dyspnea that is worse with position changes such as bending over and when standing from lying. He denies chest pain, palpitations, or fever.   ED Course: On arrival to [facility] ED patient was noted to be afebrile temp 36.8C, BP 142/111, HR 88, RR 22, SPO2 100% on 15L non-rebreather. Patient now on 2L via nasal cannula with SPO2 96%. CXR obtained and shows non specific findings of increased interstitial markings. CTA PE obtained and shows no PE, dilated RV and reflux of contract into the IVC and hepatic vein consistent with (R) heart failure, chronic bronchitis, and emphysema. US  DVT (B)LE negative for DVT. Labs notable Negative COVID, flu, RSV,for BNP 284.2, BUN 24, troponin 41--> 38 with no report of chest pain.  Lactic acid 0.9.  Blood cultures drawn and in process.  He was given Lasix , DuoNebs, and Solu-Medrol .    Patient was initially treated in the hospital, condition has improved.  Currently just pending for placement.   Principal  Problem:   Depression Active Problems:   Acute on chronic respiratory failure with hypoxia and hypercapnia (HCC)   COPD with acute exacerbation (HCC)   Acute on chronic diastolic CHF (congestive heart failure) (HCC)   Tobacco use disorder   Headache   Increased anion gap metabolic acidosis   Hypernatremia   Protein-calorie malnutrition, severe   Skin pustule   Emphysema lung (HCC)   Assessment and Plan: * Depression On Seroquel  at night as needed Xanax  and Lexapro .  Added Wellbutrin  on 12/22.     Acute respiratory failure with hypoxia and hypercapnia (HCC)-resolved as of 12/02/2024 Pulse ox dropped to 82% with ambulation yesterday.  Awaiting pulse ox from today.  The patient is homeless will need to be able to come off oxygen prior to disposition.  Continue to try to see if we can get off oxygen on a daily basis.  VBG shows a pCO2 of 81.  Patient unable to tolerate BiPAP.  The patient likely has acute on chronic respiratory failure.  - Continue to require oxygen, need oxygen for home and he is currently homeless, cannot go to a shelter with oxygen.   COPD with acute exacerbation (HCC) Treated during the hospital course   Acute on chronic diastolic CHF (congestive heart failure) (HCC) EF 55 to 60%.  Off Lasix  at this time and stable   Tobacco use disorder On NicoDerm patch   Headache Improved.  Tylenol  as needed.  IV magnesium  given on 11/5.   Increased anion gap metabolic  acidosis Resolved   Hypernatremia Resolved   Skin pustule Started doxycycline  on 12/18.  ANCA profile negative.  Added MetroGel .   Protein-calorie malnutrition, severe Continue supplements  Patient still require oxygen, no sickly short of breath.  No change in treatment plan.    Subjective: Patient doing well, denies any short of breath.  Still on 2 L oxygen.  Physical Exam: Vitals:   12/09/24 1611 12/09/24 1934 12/10/24 0440 12/10/24 0803  BP: 101/77 122/79 119/71 112/81  Pulse: 79 78 85 83   Resp: 16   18  Temp: 97.9 F (36.6 C) 98 F (36.7 C) 98 F (36.7 C) 98.2 F (36.8 C)  TempSrc:  Oral Oral   SpO2: 96% 94% 91% 92%  Weight:   62 kg   Height:       General exam: Appears calm and comfortable  Respiratory system: Decreased breath sounds. Respiratory effort normal. Cardiovascular system: S1 & S2 heard, RRR. No JVD, murmurs, rubs, gallops or clicks. No pedal edema. Gastrointestinal system: Abdomen is nondistended, soft and nontender. No organomegaly or masses felt. Normal bowel sounds heard. Central nervous system: Alert and oriented. No focal neurological deficits. Extremities: Symmetric 5 x 5 power. Skin: No rashes, lesions or ulcers Psychiatry: Judgement and insight appear normal. Mood & affect appropriate.    Data Reviewed:  There are no new results to review at this time.  Family Communication: None  Disposition: Status is: Inpatient Remains inpatient appropriate because: Unsafe discharge,     Time spent: 35 minutes  Author: Murvin Mana, MD 12/10/2024 11:17 AM  For on call review [email or web address].    "

## 2024-12-10 NOTE — Consult Note (Signed)
 [facility] Psychiatric Consult Follow Up  Patient Name: .Corey House.  MRN: [id number]  DOB: 1966-03-25  Consult Order details:  Orders (From admission, onward)     Start     Ordered   12/07/24 1131  IP CONSULT TO PSYCHIATRY       Comments: Dr Josette savoy  Ordering Provider: Josette Ade, MD  Provider:  Lynnann Merle, NP  Question Answer Comment  Location [facility]   Reason for Consult? depression      12/07/24 1130   10/21/24 1005  IP CONSULT TO PSYCHIATRY       Comments: Dr Josette savoy  Ordering Provider: Josette Ade, MD  Provider:  Donnelly Mellow, MD  Question Answer Comment  Location [facility]   Reason for Consult? lost will to live      10/21/24 1005             Mode of Visit: In person    Psychiatry Consult Evaluation  Service Date: December 10, 2024 LOS:  LOS: 51 days  Chief Complaint I just had a bad day  Primary Psychiatric Diagnoses  Depression   Assessment   Corey House. is a 57 y.o. male admitted: Medicallyfor 10/20/2024 10:47 AM for shortness of breath . He carries the psychiatric diagnoses of Depression/anxiety and has a past medical history of  tobacco use disorder reporting a 41 year pack per year history, smoking ~1 pack per day since he was 58 years old. He denies any additional medical history though he has not seen a medical provider in ~20 years.     Patient is medically hospitalized with comorbid depression and anxiety, currently experiencing housing instability. He demonstrates fair insight and is engaged with treatment planning. Current medication regimen includes Lexapro  with recent addition of Wellbutrin  for augmentation and smoking cessation support. Psychiatry will continue to monitor patient throughout medical admission and follow response to newly initiated Wellbutrin . Will defer addition of BuSpar at this time per patient preference, allowing trial period to  assess Wellbutrin  efficacy. Will reach out to SW to assist with arrangement of appropriate outpatient mental health resources. Will continue to monitor tolerance to current medication regimen and assess for any emerging side effects. Psychiatry will continue to round.  Currently, patient denies suicidal or homicidal ideations.  Patient denies auditory or visual hallucinations as well.On current presentation there was no evidence of psychosis or mania and patient did not appear to be responding to internal stimuli. At this time, patient does not appear to be a risk to self or others.While future psychiatric events cannot be accurately predicted, the patient does not currently require acute inpatient psychiatric care and does not currently meet [facility]  involuntary commitment criteria.   12/10/2024: Patient was seen on rounds today by psychiatry. Patient denied suicidal or homicidal ideation. He denied auditory or visual hallucinations. Patient was recently started on Wellbutrin  therapy by the medical team. Patient reported continued increased anxiety, however he acknowledges that he has not been on the new medication long enough to determine efficacy. Patient did report overall improvement in mood from his initial presentation. He denied any noted side effects of his current medication regimen.  On mental status examination, patient was alert and oriented to person, place, time, and situation and remained calm and cooperative with the psychiatry team during assessment. Patient was not displaying any signs or symptoms of psychosis or mania and did not appear to be responding to internal stimuli.  Patient continues  to be agreeable with the social work team regarding setting him up with appropriate outpatient psychiatric resources once his disposition location and discharge details are determined. While future psychiatric events cannot be accurately predicted, the patient does not currently require acute  inpatient psychiatric care and does not currently meet [facility]  involuntary commitment criteria.     Diagnoses:  Active Hospital problems: Principal Problem:   Depression Active Problems:   COPD with acute exacerbation (HCC)   Tobacco use disorder   Protein-calorie malnutrition, severe   Acute on chronic diastolic CHF (congestive heart failure) (HCC)   Headache   Increased anion gap metabolic acidosis   Hypernatremia   Acute on chronic respiratory failure with hypoxia and hypercapnia (HCC)   Skin pustule   Emphysema lung (HCC)    Plan   ## Psychiatric Medication Recommendations:  Assess patient tolerance to Wellbutrin  before making further recommendations  ## Medical Decision Making Capacity: Not specifically addressed in this encounter  ## Further Work-up:   -- most recent EKG on 10/20/2024 had QtC of 506: new EKG ordered today for Qtc monitoring  -- Pertinent labwork reviewed earlier this admission includes: CBC BMP   ## Disposition:-- There are no psychiatric contraindications to discharge at this time  ## Behavioral / Environmental: - No specific recommendations at this time.     ## Safety and Observation Level:  - Based on my clinical evaluation, I estimate the patient to be at low risk of self harm in the current setting. - At this time, we recommend  routine. This decision is based on my review of the chart including patient's history and current presentation, interview of the patient, mental status examination, and consideration of suicide risk including evaluating suicidal ideation, plan, intent, suicidal or self-harm behaviors, risk factors, and protective factors. This judgment is based on our ability to directly address suicide risk, implement suicide prevention strategies, and develop a safety plan while the patient is in the clinical setting. Please contact our team if there is a concern that risk level has changed.  CSSR Risk Category:C-SSRS RISK  CATEGORY: No Risk  Suicide Risk Assessment: Patient has following modifiable risk factors for suicide: lack of access to outpatient mental health resources, which we are addressing by involving social work to help provide appropriate resources. Patient has following non-modifiable or demographic risk factors for suicide: male gender Patient has the following protective factors against suicide: no history of suicide attempts and no history of NSSIB  Thank you for this consult request. Recommendations have been communicated to the primary team.  We will continue to be available as needed at this time.   Zelda Sharps, NP        History of Present Illness  Relevant Aspects of [facility]   Patient Report:  Psychiatry consulted by medical team. Patient reports doing all right today and states he was having one of them days yesterday but is feeling much better now. He endorses ongoing anxiety and reports previous treatment with Xanax , though his prior provider advised against long-term use due to concerns regarding increased risk of Alzheimer's disease and dementia. Patient states it is difficult to determine if his current Lexapro  regimen is effectively addressing his anxiety and depression. He recalls taking Paxil in the past, which he believes was helpful, though he is uncertain given the length of time since he last took it. Patient reports the medical team initiated Wellbutrin  today to augment treatment for depression and anxiety as well as assist with smoking cessation,  and he is agreeable to this plan. He is willing to wait and assess response to Wellbutrin  before considering addition of another agent such as BuSpar for anxiety management. Patient denies current suicidal or homicidal ideations, auditory or visual hallucinations, and any history of self-harm. He denies access to weapons and reports no current alcohol  or recreational drug use, though he continues to smoke cigarettes daily.  This is his second recent hospitalization for respiratory problems. Patient is agreeable to establishing outpatient mental health resources and reports the medical team may be arranging placement in a group home, with preference for outpatient services located near that placement. Patient is alert and oriented to person, place, time, and situation. He presents as calm and cooperative with the psychiatry team. No objective signs of psychosis or mania observed, and patient does not appear to be responding to internal stimuli. He denies any noted side effects at this time, though Wellbutrin  was just recently initiated.  Psych ROS:  Depression: Denied Anxiety: Endorsed Mania (lifetime and current): Denied Psychosis: (lifetime and current): Denied  Collateral information:  No collateral information provided by patient at this time.  Patient reported not having any local family or friends currently   Psychiatric and Social History  Psychiatric History:  Information collected from Patient/chart review  Prev Dx/Sx: Depression, anxiety Current Psych Provider: None Home Meds (current): Lexapro , seroquel , newly added wellbutrin  Previous Med Trials: Paxil Therapy: Denied  Prior Psych Hospitalization: Yes Prior Self Harm: Reported history of suicidal thoughts, but denied any previous suicide attempts or self-harm Prior Violence: Denied   Family Psych History: Unknown Family Hx suicide: Unknown   Social History:   Educational Hx: Unknown Occupational Hx: Reported inability to work related to his chronic medical conditions-not currently receiving disability checks Legal Hx: Denied Living Situation: Reported living placed a place wherever I can.  Reported being homeless Spiritual Hx: Unknown Access to weapons/lethal means: Denied   Substance History Alcohol : Denied current use History of alcohol  withdrawal seizures denied History of DT's denied Tobacco: Daily cigarette use Illicit drugs:  Denied Prescription drug abuse: Denied Rehab hx: Denied  Exam Findings  Physical Exam: Reviewed and agree with the physical exam findings conducted by the medical provider Vital Signs:  Temp:  [97.9 F (36.6 C)-98.2 F (36.8 C)] 98.2 F (36.8 C) (12/25 0803) Pulse Rate:  [78-85] 83 (12/25 0803) Resp:  [16-18] 18 (12/25 0803) BP: (101-122)/(71-81) 112/81 (12/25 0803) SpO2:  [91 %-96 %] 92 % (12/25 0803) Weight:  [62 kg] 62 kg (12/25 0440) Blood pressure 112/81, pulse 83, temperature 98.2 F (36.8 C), resp. rate 18, height 5' 4.02 (1.626 m), weight 62 kg, SpO2 92%. Body mass index is 23.45 kg/m.    Mental Status Exam: General Appearance: Casual  Orientation:  Full (Time, Place, and Person)  Memory:  Immediate;   Good Recent;   Good Remote;   Good  Concentration:  Concentration: Good  Recall:  Good  Attention  Good  Eye Contact:  Good  Speech:  Clear and Coherent  Language:  Good  Volume:  Normal  Mood: Anxious  Affect:  Congruent  Thought Process:  Coherent, Goal Directed, and Linear  Thought Content:  Logical  Suicidal Thoughts:  No  Homicidal Thoughts:  No  Judgement:  Fair  Insight:  Fair  Psychomotor Activity:  Normal  Akathisia:  No  Fund of Knowledge:  Fair      Assets:  Communication Skills Desire for Improvement  Cognition:  WNL  ADL's:  Intact  AIMS (  if indicated):        Other History   These have been pulled in through the EMR, reviewed, and updated if appropriate.  Family History:  The patient's family history is not on file.  Medical History: Past Medical History:  Diagnosis Date   Chronic cough    Urolithiasis    Urolithiasis     Surgical History: Past Surgical History:  Procedure Laterality Date   CATARACT EXTRACTION W/PHACO Left 08/09/2021   Procedure: CATARACT EXTRACTION PHACO AND INTRAOCULAR LENS PLACEMENT (IOC) LEFT .051 00:10.0;  Surgeon: Mittie Gaskin, MD;  Location: [facility];  Service: Ophthalmology;   Laterality: Left;   CATARACT EXTRACTION W/PHACO Right 10/04/2021   Procedure: CATARACT EXTRACTION PHACO AND INTRAOCULAR LENS PLACEMENT (IOC) RIGHT 0.67 00:13.8;  Surgeon: Mittie Gaskin, MD;  Location: [facility];  Service: Ophthalmology;  Laterality: Right;  general anesthesia needs to be last case   HERNIA REPAIR     x2   MANDIBLE FRACTURE SURGERY       Medications:  Current Medications[1]  Allergies: Allergies[2] Zelda Sharps, NP This note was created using Dragon dictation software. Please excuse any inadvertent transcription errors. Case was discussed with supervising physician Dr. Jadapalle who is agreeable with current plan.       [1]  Current Facility-Administered Medications:    acetaminophen  (TYLENOL ) tablet 650 mg, 650 mg, Oral, Q6H PRN, Josette Ade, MD, 650 mg at 12/10/24 1124   acetaminophen -caffeine  (EXCEDRIN  TENSION HEADACHE) 500-65 MG per tablet 1 tablet, 1 tablet, Oral, Q8H PRN, Maree Hue, MD, 1 tablet at 12/01/24 1711   albuterol  (PROVENTIL ) (2.5 MG/3ML) 0.083% nebulizer solution 2.5 mg, 2.5 mg, Nebulization, Q2H PRN, Foust, Katy L, NP, 2.5 mg at 10/23/24 9176   ALPRAZolam  (XANAX ) tablet 0.25 mg, 0.25 mg, Oral, TID PRN, Maree Hue, MD, 0.25 mg at 12/10/24 9175   artificial tears ophthalmic solution 1 drop, 1 drop, Both Eyes, PRN, Foust, Katy L, NP   buPROPion  (WELLBUTRIN  XL) 24 hr tablet 150 mg, 150 mg, Oral, Daily, Wieting, Richard, MD, 150 mg at 12/10/24 9175   enoxaparin  (LOVENOX ) injection 40 mg, 40 mg, Subcutaneous, Q24H, Foust, Katy L, NP, 40 mg at 12/09/24 1654   escitalopram  (LEXAPRO ) tablet 20 mg, 20 mg, Oral, Daily, Amin, Sumayya, MD, 20 mg at 12/10/24 9176   guaiFENesin  (MUCINEX ) 12 hr tablet 600 mg, 600 mg, Oral, BID, Foust, Katy L, NP, 600 mg at 12/10/24 9175   loratadine  (CLARITIN ) tablet 10 mg, 10 mg, Oral, Daily PRN, Pokhrel, Laxman, MD, 10 mg at 12/08/24 2013   melatonin tablet 5 mg, 5 mg, Oral, QHS PRN, Foust, Katy L, NP, 5 mg  at 12/08/24 2013   metroNIDAZOLE  (METROGEL ) 0.75 % gel, , Topical, BID, Wieting, Richard, MD, Given at 12/10/24 9175   multivitamin with minerals tablet 1 tablet, 1 tablet, Oral, Daily, Wieting, Richard, MD, 1 tablet at 12/10/24 9176   nicotine  (NICODERM CQ  - dosed in mg/24 hours) patch 14 mg, 14 mg, Transdermal, Daily, Foust, Katy L, NP, 14 mg at 12/10/24 9173   nicotine  polacrilex (NICORETTE ) gum 2 mg, 2 mg, Oral, PRN, Foust, Katy L, NP   Oral care mouth rinse, 15 mL, Mouth Rinse, PRN, Wieting, Richard, MD   pantoprazole  (PROTONIX ) EC tablet 40 mg, 40 mg, Oral, Daily, Foust, Katy L, NP, 40 mg at 12/10/24 9176   QUEtiapine  (SEROQUEL ) tablet 25 mg, 25 mg, Oral, QHS, Ulonda Klosowski B, NP, 25 mg at 12/09/24 2108   sodium chloride  (OCEAN) 0.65 % nasal spray 1  spray, 1 spray, Each Nare, PRN, Patel, Sona, MD   umeclidinium-vilanterol (ANORO ELLIPTA ) 62.5-25 MCG/ACT 1 puff, 1 puff, Inhalation, Daily, Amin, Sumayya, MD, 1 puff at 12/10/24 0825 [2] No Known Allergies

## 2024-12-10 NOTE — Plan of Care (Signed)

## 2024-12-11 DIAGNOSIS — J9621 Acute and chronic respiratory failure with hypoxia: Secondary | ICD-10-CM | POA: Diagnosis not present

## 2024-12-11 DIAGNOSIS — J441 Chronic obstructive pulmonary disease with (acute) exacerbation: Secondary | ICD-10-CM | POA: Diagnosis not present

## 2024-12-11 DIAGNOSIS — J9622 Acute and chronic respiratory failure with hypercapnia: Secondary | ICD-10-CM | POA: Diagnosis not present

## 2024-12-11 DIAGNOSIS — F32A Depression, unspecified: Secondary | ICD-10-CM | POA: Diagnosis not present

## 2024-12-11 NOTE — Plan of Care (Signed)

## 2024-12-11 NOTE — TOC Progression Note (Signed)
 Transition of Care (TOC) - Progression Note    Patient Details  Name: Corey House. MRN: [id number] Date of Birth: 10-09-1966  Transition of [facility]) CM/SW Contact  Daved JONETTA Hamilton, RN Phone Number: 12/11/2024, 6:03 PM  Clinical Narrative:     This CM reached out to Nadeen Borrow to inquire about in person assessment with patient and what the results are, left message, awaiting contact back.   Expected Discharge Plan:  (patient is currently homeless, unsure of discharge location at this time) Barriers to Discharge: Financial Resources               Expected Discharge Plan and Services   Discharge Planning Services: CM Consult   Living arrangements for the past 2 months: Homeless Expected Discharge Date: 11/03/24                                     Social Drivers of Health (SDOH) Interventions SDOH Screenings   Food Insecurity: Food Insecurity Present (10/20/2024)  Housing: High Risk (10/21/2024)  Transportation Needs: Unmet Transportation Needs (10/20/2024)  Utilities: At Risk (10/20/2024)  Alcohol  Screen: Low Risk (04/23/2024)  Social Connections: Unknown (04/23/2024)  Tobacco Use: High Risk (10/21/2024)    Readmission Risk Interventions     No data to display

## 2024-12-11 NOTE — Plan of Care (Signed)
  Problem: Education: Goal: Knowledge of General Education information will improve Description: Including pain rating scale, medication(s)/side effects and non-pharmacologic comfort measures Outcome: Progressing   Problem: Health Behavior/Discharge Planning: Goal: Ability to manage health-related needs will improve Outcome: Progressing   Problem: Clinical Measurements: Goal: Ability to maintain clinical measurements within normal limits will improve Outcome: Progressing Goal: Will remain free from infection Outcome: Progressing Goal: Diagnostic test results will improve Outcome: Progressing Goal: Respiratory complications will improve Outcome: Progressing Goal: Cardiovascular complication will be avoided Outcome: Progressing   Problem: Activity: Goal: Risk for activity intolerance will decrease Outcome: Progressing   Problem: Nutrition: Goal: Adequate nutrition will be maintained Outcome: Progressing   Problem: Coping: Goal: Level of anxiety will decrease Outcome: Progressing   Problem: Elimination: Goal: Will not experience complications related to bowel motility Outcome: Progressing Goal: Will not experience complications related to urinary retention Outcome: Progressing   Problem: Pain Managment: Goal: General experience of comfort will improve and/or be controlled Outcome: Progressing   Problem: Skin Integrity: Goal: Risk for impaired skin integrity will decrease Outcome: Progressing   Problem: Education: Goal: Knowledge of disease or condition will improve Outcome: Progressing Goal: Knowledge of the prescribed therapeutic regimen will improve Outcome: Progressing Goal: Individualized Educational Video(s) Outcome: Progressing   Problem: Activity: Goal: Ability to tolerate increased activity will improve Outcome: Progressing Goal: Will verbalize the importance of balancing activity with adequate rest periods Outcome: Progressing   Problem:  Respiratory: Goal: Ability to maintain a clear airway will improve Outcome: Progressing Goal: Levels of oxygenation will improve Outcome: Progressing Goal: Ability to maintain adequate ventilation will improve Outcome: Progressing

## 2024-12-11 NOTE — Progress Notes (Signed)
 " Progress Note   Patient: Corey House. FMW:[id number] DOB: 1966/03/04 DOA: 10/20/2024     52 DOS: the patient was seen and examined on 12/11/2024   Brief hospital course: 58 y.o. year old male with past medical history of  tobacco use disorder reporting a 41 year pack per year history, smoking ~1 pack per day since he was 58 years old. He denies any additional medical history though he has not seen a medical provider in ~20 years. He presents to [facility] ED with shortness of breath that began ~ 9 months ago and worsened ~3 months ago. He reports today his dyspnea is not significantly different than it was 3 months ago. He endorses a chronic cough that has been present since childhood. Her ports the cough is currently, minimally productive with white phlegm. Additionally, he endorses bilateral lower extremity swelling that began in the last 2 days, per his report (R) > (L). On exam the swelling is equal bilaterally. He endorses fatigue and dyspnea that is worse with position changes such as bending over and when standing from lying. He denies chest pain, palpitations, or fever.   ED Course: On arrival to [facility] ED patient was noted to be afebrile temp 36.8C, BP 142/111, HR 88, RR 22, SPO2 100% on 15L non-rebreather. Patient now on 2L via nasal cannula with SPO2 96%. CXR obtained and shows non specific findings of increased interstitial markings. CTA PE obtained and shows no PE, dilated RV and reflux of contract into the IVC and hepatic vein consistent with (R) heart failure, chronic bronchitis, and emphysema. US  DVT (B)LE negative for DVT. Labs notable Negative COVID, flu, RSV,for BNP 284.2, BUN 24, troponin 41--> 38 with no report of chest pain.  Lactic acid 0.9.  Blood cultures drawn and in process.  He was given Lasix , DuoNebs, and Solu-Medrol .    Patient was initially treated in the hospital, condition has improved.  Currently just pending for placement.   Principal  Problem:   Depression Active Problems:   Acute on chronic respiratory failure with hypoxia and hypercapnia (HCC)   COPD with acute exacerbation (HCC)   Acute on chronic diastolic CHF (congestive heart failure) (HCC)   Tobacco use disorder   Headache   Increased anion gap metabolic acidosis   Hypernatremia   Protein-calorie malnutrition, severe   Skin pustule   Emphysema lung (HCC)   Assessment and Plan: * Depression On Seroquel  at night as needed Xanax  and Lexapro .  Added Wellbutrin  on 12/22.  Followed by psychiatry.   Acute respiratory failure with hypoxia and hypercapnia (HCC)-resolved as of 12/02/2024 Pulse ox dropped to 82% with ambulation yesterday.  Awaiting pulse ox from today.  The patient is homeless will need to be able to come off oxygen prior to disposition.  Continue to try to see if we can get off oxygen on a daily basis.  VBG shows a pCO2 of 81.  Patient unable to tolerate BiPAP.  The patient likely has acute on chronic respiratory failure.  - Continue to require oxygen, need oxygen for home and he is currently homeless, cannot go to a shelter with oxygen.  Patient is still requiring oxygen.   COPD with acute exacerbation (HCC) Treated during the hospital course   Acute on chronic diastolic CHF (congestive heart failure) (HCC) EF 55 to 60%.  Off Lasix  at this time and stable   Tobacco use disorder On NicoDerm patch   Headache Improved.  Tylenol  as needed.  IV magnesium  given  on 11/5.   Increased anion gap metabolic acidosis Resolved   Hypernatremia Resolved   Skin pustule Started doxycycline  on 12/18.  ANCA profile negative.  Added MetroGel . Condition is improving.   Protein-calorie malnutrition, severe Continue supplements         Subjective:  Patient doing well today, short of breath much better.  But still requiring oxygen to  Physical Exam: Vitals:   12/10/24 1934 12/11/24 0436 12/11/24 0437 12/11/24 0754  BP: 104/75 103/76  93/78   Pulse: 88 90  90  Resp: 16 18  16   Temp: 97.9 F (36.6 C) 97.7 F (36.5 C)  98.4 F (36.9 C)  TempSrc: Oral Oral    SpO2: 93% 94%  93%  Weight:   61.9 kg   Height:       General exam: Appears calm and comfortable  Respiratory system: Decreased breath sounds. Respiratory effort normal. Cardiovascular system: S1 & S2 heard, RRR. No JVD, murmurs, rubs, gallops or clicks. No pedal edema. Gastrointestinal system: Abdomen is nondistended, soft and nontender. No organomegaly or masses felt. Normal bowel sounds heard. Central nervous system: Alert and oriented. No focal neurological deficits. Extremities: Symmetric 5 x 5 power. Skin: No rashes, lesions or ulcers Psychiatry: Judgement and insight appear normal. Mood & affect appropriate.    Data Reviewed:  There are no new results to review at this time.  Family Communication: None  Disposition: Status is: Inpatient Remains inpatient appropriate because: Unsafe discharge.     Time spent: 35 minutes  Author: Murvin Mana, MD 12/11/2024 12:12 PM  For on call review [email or web address]    "

## 2024-12-11 NOTE — Progress Notes (Signed)
 Rounded on patient. No unmet needs at present time. Reports no one has come within the last 1-2 weeks for [facility] assessment. Will follow up with inpatient case management.

## 2024-12-12 DIAGNOSIS — J441 Chronic obstructive pulmonary disease with (acute) exacerbation: Secondary | ICD-10-CM | POA: Diagnosis not present

## 2024-12-12 DIAGNOSIS — I5033 Acute on chronic diastolic (congestive) heart failure: Secondary | ICD-10-CM | POA: Diagnosis not present

## 2024-12-12 DIAGNOSIS — F32A Depression, unspecified: Secondary | ICD-10-CM | POA: Diagnosis not present

## 2024-12-12 NOTE — Progress Notes (Signed)
 " Progress Note   Patient: Corey House. FMW:[id number] DOB: November 13, 1966 DOA: 10/20/2024     57 DOS: the patient was seen and examined on 12/12/2024   Brief hospital course: 58 y.o. year old male with past medical history of  tobacco use disorder reporting a 57 year pack per year history, smoking ~1 pack per day since he was 58 years old. He denies any additional medical history though he has not seen a medical provider in ~20 years. He presents to [facility] ED with shortness of breath that began ~ 57 months ago and worsened ~57 months ago. He reports today his dyspnea is not significantly different than it was 3 months ago. He endorses a chronic cough that has been present since childhood. Her ports the cough is currently, minimally productive with white phlegm. Additionally, he endorses bilateral lower extremity swelling that began in the last 2 days, per his report (R) > (L). On exam the swelling is equal bilaterally. He endorses fatigue and dyspnea that is worse with position changes such as bending over and when standing from lying. He denies chest pain, palpitations, or fever.   ED Course: On arrival to [facility] ED patient was noted to be afebrile temp 36.8C, BP 142/111, HR 88, RR 22, SPO2 100% on 15L non-rebreather. Patient now on 2L via nasal cannula with SPO2 96%. CXR obtained and shows non specific findings of increased interstitial markings. CTA PE obtained and shows no PE, dilated RV and reflux of contract into the IVC and hepatic vein consistent with (R) heart failure, chronic bronchitis, and emphysema. US  DVT (B)LE negative for DVT. Labs notable Negative COVID, flu, RSV,for BNP 284.2, BUN 24, troponin 41--> 38 with no report of chest pain.  Lactic acid 0.9.  Blood cultures drawn and in process.  He was given Lasix , DuoNebs, and Solu-Medrol .    Patient was initially treated in the hospital, condition has improved.  Currently just pending for placement.   Principal  Problem:   Depression Active Problems:   Acute on chronic respiratory failure with hypoxia and hypercapnia (HCC)   COPD with acute exacerbation (HCC)   Acute on chronic diastolic CHF (congestive heart failure) (HCC)   Tobacco use disorder   Headache   Increased anion gap metabolic acidosis   Hypernatremia   Protein-calorie malnutrition, severe   Skin pustule   Emphysema lung (HCC)   Assessment and Plan:  Depression On Seroquel  at night as needed Xanax  and Lexapro .  Added Wellbutrin  on 12/22.  Followed by psychiatry.  Patient still has significant depression, consider increase Wellbutrin  dose by 12/29.    Acute respiratory failure with hypoxia and hypercapnia (HCC)-resolved as of 12/02/2024 Pulse ox dropped to 82% with ambulation yesterday.  Awaiting pulse ox from today.  The patient is homeless will need to be able to come off oxygen prior to disposition.  Continue to try to see if we can get off oxygen on a daily basis.  VBG shows a pCO2 of 81.  Patient unable to tolerate BiPAP.  The patient likely has acute on chronic respiratory failure.  - Continue to require oxygen, need oxygen for home and he is currently homeless, cannot go to a shelter with oxygen.  Patient is still requiring oxygen.   COPD with acute exacerbation (HCC) Treated during the hospital course   Acute on chronic diastolic CHF (congestive heart failure) (HCC) EF 55 to 60%.  Off Lasix  at this time and stable   Tobacco use disorder On NicoDerm  patch   Headache Improved.  Tylenol  as needed.  IV magnesium  given on 11/5.   Increased anion gap metabolic acidosis Resolved   Hypernatremia Resolved   Skin pustule Started doxycycline  on 12/18.  ANCA profile negative.  Added MetroGel . Condition is improving.   Protein-calorie malnutrition, severe Continue supplements      Subjective:  Patient still depressed, has no motivation getting up.  But not suicidal ideation.  Physical Exam: Vitals:   12/11/24  1609 12/11/24 1944 12/12/24 0430 12/12/24 0741  BP: 102/69 113/75 100/75 105/72  Pulse: 83 86 82 90  Resp: 16 16 17 16   Temp: 98.2 F (36.8 C) 98.2 F (36.8 C) 98.1 F (36.7 C) 97.8 F (36.6 C)  TempSrc: Oral Oral Oral Oral  SpO2: 94% 94% 94% 96%  Weight:   61.5 kg   Height:       General exam: Appears calm and comfortable  Respiratory system: Clear to auscultation. Respiratory effort normal. Cardiovascular system: S1 & S2 heard, RRR. No JVD, murmurs, rubs, gallops or clicks. No pedal edema. Gastrointestinal system: Abdomen is nondistended, soft and nontender. No organomegaly or masses felt. Normal bowel sounds heard. Central nervous system: Alert and oriented. No focal neurological deficits. Extremities: Symmetric 5 x 5 power. Skin: No rashes, lesions or ulcers Psychiatry: Depressed mood.   Data Reviewed:  There are no new results to review at this time.  Family Communication: None  Disposition: Status is: Inpatient Remains inpatient appropriate because: Unsafe discharge.     Time spent: 35 minutes  Author: Murvin Mana, MD 12/12/2024 12:26 PM  For on call review [email or web address]    "

## 2024-12-12 NOTE — Plan of Care (Signed)

## 2024-12-13 DIAGNOSIS — F32A Depression, unspecified: Secondary | ICD-10-CM | POA: Diagnosis not present

## 2024-12-13 DIAGNOSIS — J9621 Acute and chronic respiratory failure with hypoxia: Secondary | ICD-10-CM | POA: Diagnosis not present

## 2024-12-13 DIAGNOSIS — J9622 Acute and chronic respiratory failure with hypercapnia: Secondary | ICD-10-CM | POA: Diagnosis not present

## 2024-12-13 DIAGNOSIS — J441 Chronic obstructive pulmonary disease with (acute) exacerbation: Secondary | ICD-10-CM | POA: Diagnosis not present

## 2024-12-13 NOTE — Plan of Care (Signed)

## 2024-12-13 NOTE — Plan of Care (Signed)
  Problem: Education: Goal: Knowledge of General Education information will improve Description: Including pain rating scale, medication(s)/side effects and non-pharmacologic comfort measures Outcome: Progressing   Problem: Health Behavior/Discharge Planning: Goal: Ability to manage health-related needs will improve Outcome: Progressing   Problem: Clinical Measurements: Goal: Will remain free from infection Outcome: Progressing Goal: Respiratory complications will improve Outcome: Progressing   Problem: Activity: Goal: Risk for activity intolerance will decrease Outcome: Progressing   Problem: Nutrition: Goal: Adequate nutrition will be maintained Outcome: Progressing   Problem: Coping: Goal: Level of anxiety will decrease Outcome: Progressing   Problem: Elimination: Goal: Will not experience complications related to bowel motility Outcome: Progressing

## 2024-12-13 NOTE — Progress Notes (Signed)
 " Progress Note   Patient: Corey House. FMW:[id number] DOB: 06-02-1966 DOA: 10/20/2024     58 DOS: the patient was seen and examined on 12/13/2024   Brief hospital course: 58 y.o. year old male with past medical history of  tobacco use disorder reporting a 41 year pack per year history, smoking ~1 pack per day since he was 58 years old. He denies any additional medical history though he has not seen a medical provider in ~20 years. He presents to [facility] ED with shortness of breath that began ~ 9 months ago and worsened ~3 months ago. He reports today his dyspnea is not significantly different than it was 3 months ago. He endorses a chronic cough that has been present since childhood. Her ports the cough is currently, minimally productive with white phlegm. Additionally, he endorses bilateral lower extremity swelling that began in the last 2 days, per his report (R) > (L). On exam the swelling is equal bilaterally. He endorses fatigue and dyspnea that is worse with position changes such as bending over and when standing from lying. He denies chest pain, palpitations, or fever.   ED Course: On arrival to [facility] ED patient was noted to be afebrile temp 36.8C, BP 142/111, HR 88, RR 22, SPO2 100% on 15L non-rebreather. Patient now on 2L via nasal cannula with SPO2 96%. CXR obtained and shows non specific findings of increased interstitial markings. CTA PE obtained and shows no PE, dilated RV and reflux of contract into the IVC and hepatic vein consistent with (R) heart failure, chronic bronchitis, and emphysema. US  DVT (B)LE negative for DVT. Labs notable Negative COVID, flu, RSV,for BNP 284.2, BUN 24, troponin 41--> 38 with no report of chest pain.  Lactic acid 0.9.  Blood cultures drawn and in process.  He was given Lasix , DuoNebs, and Solu-Medrol .    Patient was initially treated in the hospital, condition has improved.  Currently just pending for placement.   Principal  Problem:   Depression Active Problems:   Acute on chronic respiratory failure with hypoxia and hypercapnia (HCC)   COPD with acute exacerbation (HCC)   Acute on chronic diastolic CHF (congestive heart failure) (HCC)   Tobacco use disorder   Headache   Increased anion gap metabolic acidosis   Hypernatremia   Protein-calorie malnutrition, severe   Skin pustule   Emphysema lung (HCC)   Assessment and Plan: Depression On Seroquel  at night as needed Xanax  and Lexapro .  Added Wellbutrin  on 12/22.  Followed by psychiatry.  Patient still has significant depression, consider increase Wellbutrin  dose by 12/29.    Acute respiratory failure with hypoxia and hypercapnia (HCC)-resolved as of 12/02/2024 Pulse ox dropped to 82% with ambulation yesterday.  Awaiting pulse ox from today.  The patient is homeless will need to be able to come off oxygen prior to disposition.  Continue to try to see if we can get off oxygen on a daily basis.  VBG shows a pCO2 of 81.  Patient unable to tolerate BiPAP.  The patient likely has acute on chronic respiratory failure.  - Continue to require oxygen, need oxygen for home and he is currently homeless, cannot go to a shelter with oxygen.  Patient is still requiring oxygen.   COPD with acute exacerbation (HCC) Treated during the hospital course   Acute on chronic diastolic CHF (congestive heart failure) (HCC) EF 55 to 60%.  Off Lasix  at this time and stable   Tobacco use disorder On NicoDerm patch  Headache Improved.  Tylenol  as needed.  IV magnesium  given on 11/5.   Increased anion gap metabolic acidosis Resolved   Hypernatremia Resolved   Skin pustule Started doxycycline  on 12/18.  ANCA profile negative.   Condition is improving.   Protein-calorie malnutrition, severe Continue supplements   Condition has improved, currently has no complaints.  But still requires oxygen.      Subjective: Patient has no complaint today.  Physical  Exam: Vitals:   12/12/24 1955 12/13/24 0428 12/13/24 0500 12/13/24 0836  BP: 105/75 103/70  117/76  Pulse: 88 82  76  Resp: 18 17  20   Temp: 98.7 F (37.1 C) (!) 97.5 F (36.4 C)  98 F (36.7 C)  TempSrc: Oral   Oral  SpO2: 96% 96%  92%  Weight:   61.5 kg   Height:       General exam: Appears calm and comfortable  Respiratory system: Decreased breath sounds. Respiratory effort normal. Cardiovascular system: S1 & S2 heard, RRR. No JVD, murmurs, rubs, gallops or clicks. No pedal edema. Gastrointestinal system: Abdomen is nondistended, soft and nontender. No organomegaly or masses felt. Normal bowel sounds heard. Central nervous system: Alert and oriented. No focal neurological deficits. Extremities: Symmetric 5 x 5 power. Skin: No rashes, lesions or ulcers Psychiatry: Judgement and insight appear normal. Mood & affect appropriate.    Data Reviewed:  There are no new results to review at this time.  Family Communication: None  Disposition: Status is: Inpatient Remains inpatient appropriate because: Unsafe discharge, pending placement     Time spent: 25 minutes  Author: Murvin Mana, MD 12/13/2024 1:14 PM  For on call review [email or web address]    "

## 2024-12-13 NOTE — Progress Notes (Signed)
 Mobility Specialist - Progress Note  Pre-mobility: SpO2-90%  During mobility: SpO2- 82% recovered to >89% in <2 min   Post-mobility:  SPO2-93%   12/13/24 1600  Mobility  Activity Ambulated with assistance;Stood at bedside;Ambulated independently  Level of Assistance Independent after set-up  Assistive Device None  Distance Ambulated (ft) 240 ft  Range of Motion/Exercises Active  Activity Response Tolerated fair  Mobility visit 1 Mobility  Mobility Specialist Start Time (ACUTE ONLY) 1518  Mobility Specialist Stop Time (ACUTE ONLY) 1528  Mobility Specialist Time Calculation (min) (ACUTE ONLY) 10 min   Pt was supine in bed with the HOB elevated on O2 @ 2 L. Pt agreed to mobility. Pt O2 vitals were taken throughout activity as a precaution. Pt is able today to get to the EOB independently with bed features. Pt is able today to STS independently with no AD. Pt ambulated well without O2. Pt did utilize a recovery break midway through activity. Pt Desat to 86% on RA. Pt recovered with O2 @ 2 L to 89% in less than 2 min. After activity pt returned to the room with needs in reach.   Clem Rodes Mobility Specialist 12/13/2024, 4:18 PM

## 2024-12-14 DIAGNOSIS — J9622 Acute and chronic respiratory failure with hypercapnia: Secondary | ICD-10-CM | POA: Diagnosis not present

## 2024-12-14 DIAGNOSIS — J9621 Acute and chronic respiratory failure with hypoxia: Secondary | ICD-10-CM | POA: Diagnosis not present

## 2024-12-14 DIAGNOSIS — F32A Depression, unspecified: Secondary | ICD-10-CM | POA: Diagnosis not present

## 2024-12-14 DIAGNOSIS — J441 Chronic obstructive pulmonary disease with (acute) exacerbation: Secondary | ICD-10-CM | POA: Diagnosis not present

## 2024-12-14 MED ORDER — BUPROPION HCL ER (XL) 300 MG PO TB24
300.0000 mg | ORAL_TABLET | Freq: Every day | ORAL | Status: DC
  Filled 2024-12-14: qty 1

## 2024-12-14 NOTE — Progress Notes (Signed)
 " Progress Note   Patient: Corey House. FMW:[id number] DOB: [birth year] DOA: 10/20/2024     55 DOS: the patient was seen and examined on 12/14/2024   Brief hospital course: 58 y.o. year old male with past medical history of  tobacco use disorder reporting a 41 year pack per year history, smoking ~1 pack per day since he was 58 years old. He denies any additional medical history though he has not seen a medical provider in ~20 years. He presents to [facility] ED with shortness of breath that began ~ 9 months ago and worsened ~3 months ago. He reports today his dyspnea is not significantly different than it was 3 months ago. He endorses a chronic cough that has been present since childhood. Her ports the cough is currently, minimally productive with white phlegm. Additionally, he endorses bilateral lower extremity swelling that began in the last 2 days, per his report (R) > (L). On exam the swelling is equal bilaterally. He endorses fatigue and dyspnea that is worse with position changes such as bending over and when standing from lying. He denies chest pain, palpitations, or fever.   ED Course: On arrival to [facility] ED patient was noted to be afebrile temp 36.8C, BP 142/111, HR 88, RR 22, SPO2 100% on 15L non-rebreather. Patient now on 2L via nasal cannula with SPO2 96%. CXR obtained and shows non specific findings of increased interstitial markings. CTA PE obtained and shows no PE, dilated RV and reflux of contract into the IVC and hepatic vein consistent with (R) heart failure, chronic bronchitis, and emphysema. US  DVT (B)LE negative for DVT. Labs notable Negative COVID, flu, RSV,for BNP 284.2, BUN 24, troponin 41--> 38 with no report of chest pain.  Lactic acid 0.9.  Blood cultures drawn and in process.  He was given Lasix , DuoNebs, and Solu-Medrol .    Patient was initially treated in the hospital, condition has improved.  Currently just pending for placement.   Principal  Problem:   Depression Active Problems:   Acute on chronic respiratory failure with hypoxia and hypercapnia (HCC)   COPD with acute exacerbation (HCC)   Acute on chronic diastolic CHF (congestive heart failure) (HCC)   Tobacco use disorder   Headache   Increased anion gap metabolic acidosis   Hypernatremia   Protein-calorie malnutrition, severe   Skin pustule   Emphysema lung (HCC)   Assessment and Plan:  Depression On Seroquel  at night as needed Xanax  and Lexapro .  Added Wellbutrin  on 12/22.  Still has depressed mood, will increase Wellbutrin  dose.   Acute respiratory failure with hypoxia and hypercapnia (HCC)-resolved as of 12/02/2024 Pulse ox dropped to 82% with ambulation yesterday.  Awaiting pulse ox from today.  The patient is homeless will need to be able to come off oxygen prior to disposition.  Continue to try to see if we can get off oxygen on a daily basis.  VBG shows a pCO2 of 81.  Patient unable to tolerate BiPAP.  The patient likely has acute on chronic respiratory failure.  - Continue to require oxygen, need oxygen for home and he is currently homeless, cannot go to a shelter with oxygen.  Patient is still requiring oxygen.   COPD with acute exacerbation (HCC) Treated during the hospital course   Acute on chronic diastolic CHF (congestive heart failure) (HCC) EF 55 to 60%.  Off Lasix  at this time and stable   Tobacco use disorder On NicoDerm patch   Headache Improved.  Tylenol  as  needed.  IV magnesium  given on 11/5.   Increased anion gap metabolic acidosis Resolved   Hypernatremia Resolved   Skin pustule Started doxycycline  on 12/18.  ANCA profile negative.   Condition is improving.   Protein-calorie malnutrition, severe Continue supplements     Subjective:  Still feel depressed, was able to ambulate better.  Physical Exam: Vitals:   12/13/24 1956 12/14/24 0352 12/14/24 0749 12/14/24 1537  BP: 119/86 111/72 117/81 102/78  Pulse: 87 84 96 89   Resp: 16 18 17 16   Temp: 98.3 F (36.8 C) (!) 97.5 F (36.4 C) 98.1 F (36.7 C) 98.5 F (36.9 C)  TempSrc: Oral Oral Oral Oral  SpO2: 91% 92% 90% 91%  Weight:  61.8 kg    Height:       General exam: Appears calm and comfortable  Respiratory system: Decreased breath sounds. Respiratory effort normal. Cardiovascular system: S1 & S2 heard, RRR. No JVD, murmurs, rubs, gallops or clicks. No pedal edema. Gastrointestinal system: Abdomen is nondistended, soft and nontender. No organomegaly or masses felt. Normal bowel sounds heard. Central nervous system: Alert and oriented. No focal neurological deficits. Extremities: Symmetric 5 x 5 power. Skin: No rashes, lesions or ulcers Psychiatry: Depressed mood   Data Reviewed:  There are no new results to review at this time.  Family Communication: None  Disposition: Status is: Inpatient Remains inpatient appropriate because: Unsafe discharge, pending placement.     Time spent: 35 minutes  Author: Murvin Mana, MD 12/14/2024 3:51 PM  For on call review [email or web address]    "

## 2024-12-14 NOTE — TOC Progression Note (Signed)
 Transition of Care (TOC) - Progression Note    Patient Details  Name: Corey House. MRN: [id number] Date of Birth: 1966-10-25  Transition of Care [facility]) CM/SW Contact  Lauraine JAYSON Carpen, LCSW Phone Number: 12/14/2024, 10:44 AM  Clinical Narrative: Nadeen was not at [facility] but [name] was able to leave her a message to follow up on assessment.    Expected Discharge Plan:  (patient is currently homeless, unsure of discharge location at this time) Barriers to Discharge: Financial Resources               Expected Discharge Plan and Services   Discharge Planning Services: CM Consult   Living arrangements for the past 2 months: Homeless Expected Discharge Date: 11/03/24                                     Social Drivers of Health (SDOH) Interventions SDOH Screenings   Food Insecurity: Food Insecurity Present (10/20/2024)  Housing: High Risk (10/21/2024)  Transportation Needs: Unmet Transportation Needs (10/20/2024)  Utilities: At Risk (10/20/2024)  Alcohol  Screen: Low Risk (04/23/2024)  Social Connections: Unknown (04/23/2024)  Tobacco Use: High Risk (10/21/2024)    Readmission Risk Interventions     No data to display

## 2024-12-14 NOTE — Consult Note (Signed)
 [facility] Psychiatric Consult Follow Up  Patient Name: .Corey House.  MRN: [id number]  DOB: 03-03-1966  Consult Order details:  Orders (From admission, onward)     Start     Ordered   12/07/24 1131  IP CONSULT TO PSYCHIATRY       Comments: Dr Josette savoy  Ordering Provider: Josette Ade, MD  Provider:  Lynnann Merle, NP  Question Answer Comment  Location [facility]   Reason for Consult? depression      12/07/24 1130   10/21/24 1005  IP CONSULT TO PSYCHIATRY       Comments: Dr Josette savoy  Ordering Provider: Josette Ade, MD  Provider:  Donnelly Mellow, MD  Question Answer Comment  Location [facility]   Reason for Consult? lost will to live      10/21/24 1005             Mode of Visit: In person    Psychiatry Consult Evaluation  Service Date: December 14, 2024 LOS:  LOS: 55 days  Chief Complaint I just had a bad day  Primary Psychiatric Diagnoses  Depression   Assessment   Corey House. is a 58 y.o. male admitted: Medicallyfor 10/20/2024 10:47 AM for shortness of breath . He carries the psychiatric diagnoses of Depression/anxiety and has a past medical history of  tobacco use disorder reporting a 41 year pack per year history, smoking ~1 pack per day since he was 58 years old. He denies any additional medical history though he has not seen a medical provider in ~20 years.     Patient is medically hospitalized with comorbid depression and anxiety, currently experiencing housing instability. He demonstrates fair insight and is engaged with treatment planning. Current medication regimen includes Lexapro  with recent addition of Wellbutrin  for augmentation and smoking cessation support. Psychiatry will continue to monitor patient throughout medical admission and follow response to newly initiated Wellbutrin . Will defer addition of BuSpar at this time per patient preference, allowing trial period to  assess Wellbutrin  efficacy. Will reach out to SW to assist with arrangement of appropriate outpatient mental health resources. Will continue to monitor tolerance to current medication regimen and assess for any emerging side effects. Psychiatry will continue to round.  Currently, patient denies suicidal or homicidal ideations.  Patient denies auditory or visual hallucinations as well.On current presentation there was no evidence of psychosis or mania and patient did not appear to be responding to internal stimuli. At this time, patient does not appear to be a risk to self or others.While future psychiatric events cannot be accurately predicted, the patient does not currently require acute inpatient psychiatric care and does not currently meet [place]  involuntary commitment criteria.   12/10/2024: Patient was seen on rounds today by psychiatry. Patient denied suicidal or homicidal ideation. He denied auditory or visual hallucinations. Patient was recently started on Wellbutrin  therapy by the medical team. Patient reported continued increased anxiety, however he acknowledges that he has not been on the new medication long enough to determine efficacy. Patient did report overall improvement in mood from his initial presentation. He denied any noted side effects of his current medication regimen.  On mental status examination, patient was alert and oriented to person, place, time, and situation and remained calm and cooperative with the psychiatry team during assessment. Patient was not displaying any signs or symptoms of psychosis or mania and did not appear to be responding to internal stimuli.  Patient continues  to be agreeable with the social work team regarding setting him up with appropriate outpatient psychiatric resources once his disposition location and discharge details are determined. While future psychiatric events cannot be accurately predicted, the patient does not currently require acute  inpatient psychiatric care and does not currently meet [place]  involuntary commitment criteria.   12/14/2024: Patient was seen on rounds today by psychiatry. Patient reported improvement in his mood and is doing okay overall. Patient denied current suicidal or homicidal ideation. He denied auditory or visual hallucinations. Patient did still endorse some depressive symptoms but attributed this to his current situation, including his physical health concerns and social factors. Patient was very calm and cooperative with the psychiatry team.  As previously documented, the medical team added Wellbutrin  to patient's current medication regimen. Patient reported tolerating this medication well at this time and denied any noted side effects. Patient declined any further medication management changes at this time, as he wishes to allow the recent medication adjustments adequate time to demonstrate therapeutic effect.  Patient remained agreeable to being aligned with appropriate outpatient mental health resources. Recommend involvement of social work to provide appropriate outpatient psychiatric resources and facilitate referrals once disposition planning is confirmed. Please reach out to psychiatry for any new concerns that may arise. Psychiatry will remain available as needed. Patient appears psychiatrically stable at this time. There are no psychiatric contraindications to disposition planning for the medical team at this time.    Diagnoses:  Active Hospital problems: Principal Problem:   Depression Active Problems:   COPD with acute exacerbation (HCC)   Tobacco use disorder   Protein-calorie malnutrition, severe   Acute on chronic diastolic CHF (congestive heart failure) (HCC)   Headache   Increased anion gap metabolic acidosis   Hypernatremia   Acute on chronic respiratory failure with hypoxia and hypercapnia (HCC)   Skin pustule   Emphysema lung (HCC)    Plan   ## Psychiatric  Medication Recommendations:  Assess patient tolerance to Wellbutrin  before making further recommendations  ## Medical Decision Making Capacity: Not specifically addressed in this encounter  ## Further Work-up:   -- most recent EKG on 10/20/2024 had QtC of 506: new EKG ordered today for Qtc monitoring  -- Pertinent labwork reviewed earlier this admission includes: CBC BMP   ## Disposition:-- There are no psychiatric contraindications to discharge at this time  ## Behavioral / Environmental: - No specific recommendations at this time.     ## Safety and Observation Level:  - Based on my clinical evaluation, I estimate the patient to be at low risk of self harm in the current setting. - At this time, we recommend  routine. This decision is based on my review of the chart including patient's history and current presentation, interview of the patient, mental status examination, and consideration of suicide risk including evaluating suicidal ideation, plan, intent, suicidal or self-harm behaviors, risk factors, and protective factors. This judgment is based on our ability to directly address suicide risk, implement suicide prevention strategies, and develop a safety plan while the patient is in the clinical setting. Please contact our team if there is a concern that risk level has changed.  CSSR Risk Category:C-SSRS RISK CATEGORY: No Risk  Suicide Risk Assessment: Patient has following modifiable risk factors for suicide: lack of access to outpatient mental health resources, which we are addressing by involving social work to help provide appropriate resources. Patient has following non-modifiable or demographic risk factors for suicide: male gender Patient has the  following protective factors against suicide: no history of suicide attempts and no history of NSSIB  Thank you for this consult request. Recommendations have been communicated to the primary team.  We will continue to be available as  needed at this time.   Zelda Sharps, NP        History of Present Illness  Relevant Aspects of [facility]   Patient Report:  Psychiatry consulted by medical team. Patient reports doing all right today and states he was having one of them days yesterday but is feeling much better now. He endorses ongoing anxiety and reports previous treatment with Xanax , though his prior provider advised against long-term use due to concerns regarding increased risk of Alzheimer's disease and dementia. Patient states it is difficult to determine if his current Lexapro  regimen is effectively addressing his anxiety and depression. He recalls taking Paxil in the past, which he believes was helpful, though he is uncertain given the length of time since he last took it. Patient reports the medical team initiated Wellbutrin  today to augment treatment for depression and anxiety as well as assist with smoking cessation, and he is agreeable to this plan. He is willing to wait and assess response to Wellbutrin  before considering addition of another agent such as BuSpar for anxiety management. Patient denies current suicidal or homicidal ideations, auditory or visual hallucinations, and any history of self-harm. He denies access to weapons and reports no current alcohol  or recreational drug use, though he continues to smoke cigarettes daily. This is his second recent hospitalization for respiratory problems. Patient is agreeable to establishing outpatient mental health resources and reports the medical team may be arranging placement in a group home, with preference for outpatient services located near that placement. Patient is alert and oriented to person, place, time, and situation. He presents as calm and cooperative with the psychiatry team. No objective signs of psychosis or mania observed, and patient does not appear to be responding to internal stimuli. He denies any noted side effects at this time, though Wellbutrin   was just recently initiated.  Psych ROS:  Depression: Denied Anxiety: Endorsed Mania (lifetime and current): Denied Psychosis: (lifetime and current): Denied  Collateral information:  No collateral information provided by patient at this time.  Patient reported not having any local family or friends currently   Psychiatric and Social History  Psychiatric History:  Information collected from Patient/chart review  Prev Dx/Sx: Depression, anxiety Current Psych Provider: None Home Meds (current): Lexapro , seroquel , newly added wellbutrin  Previous Med Trials: Paxil Therapy: Denied  Prior Psych Hospitalization: Yes Prior Self Harm: Reported history of suicidal thoughts, but denied any previous suicide attempts or self-harm Prior Violence: Denied   Family Psych History: Unknown Family Hx suicide: Unknown   Social History:   Educational Hx: Unknown Occupational Hx: Reported inability to work related to his chronic medical conditions-not currently receiving disability checks Legal Hx: Denied Living Situation: Reported living placed a place wherever I can.  Reported being homeless Spiritual Hx: Unknown Access to weapons/lethal means: Denied   Substance History Alcohol : Denied current use History of alcohol  withdrawal seizures denied History of DT's denied Tobacco: Daily cigarette use Illicit drugs: Denied Prescription drug abuse: Denied Rehab hx: Denied  Exam Findings  Physical Exam: Reviewed and agree with the physical exam findings conducted by the medical provider Vital Signs:  Temp:  [97.5 F (36.4 C)-98.7 F (37.1 C)] 98.1 F (36.7 C) (12/29 0749) Pulse Rate:  [84-96] 96 (12/29 0749) Resp:  [16-19] 17 (12/29  0749) BP: (111-119)/(72-86) 117/81 (12/29 0749) SpO2:  [90 %-93 %] 90 % (12/29 0749) Weight:  [61.8 kg] 61.8 kg (12/29 0352) Blood pressure 117/81, pulse 96, temperature 98.1 F (36.7 C), temperature source Oral, resp. rate 17, height 5' 4.02 (1.626 m),  weight 61.8 kg, SpO2 90%. Body mass index is 23.37 kg/m.    Mental Status Exam: General Appearance: Casual  Orientation:  Full (Time, Place, and Person)  Memory:  Immediate;   Good Recent;   Good Remote;   Good  Concentration:  Concentration: Good  Recall:  Good  Attention  Good  Eye Contact:  Good  Speech:  Clear and Coherent  Language:  Good  Volume:  Normal  Mood: Anxious  Affect:  Congruent  Thought Process:  Coherent, Goal Directed, and Linear  Thought Content:  Logical  Suicidal Thoughts:  No  Homicidal Thoughts:  No  Judgement:  Fair  Insight:  Fair  Psychomotor Activity:  Normal  Akathisia:  No  Fund of Knowledge:  Fair      Assets:  Communication Skills Desire for Improvement  Cognition:  WNL  ADL's:  Intact  AIMS (if indicated):        Other History   These have been pulled in through the EMR, reviewed, and updated if appropriate.  Family History:  The patient's family history is not on file.  Medical History: Past Medical History:  Diagnosis Date   Chronic cough    Urolithiasis    Urolithiasis     Surgical History: Past Surgical History:  Procedure Laterality Date   CATARACT EXTRACTION W/PHACO Left 08/09/2021   Procedure: CATARACT EXTRACTION PHACO AND INTRAOCULAR LENS PLACEMENT (IOC) LEFT .051 00:10.0;  Surgeon: Mittie Gaskin, MD;  Location: [facility];  Service: Ophthalmology;  Laterality: Left;   CATARACT EXTRACTION W/PHACO Right 10/04/2021   Procedure: CATARACT EXTRACTION PHACO AND INTRAOCULAR LENS PLACEMENT (IOC) RIGHT 0.67 00:13.8;  Surgeon: Mittie Gaskin, MD;  Location: [facility];  Service: Ophthalmology;  Laterality: Right;  general anesthesia needs to be last case   HERNIA REPAIR     x2   MANDIBLE FRACTURE SURGERY       Medications:  Current Medications[1]  Allergies: Allergies[2] Zelda Sharps, NP This note was created using Dragon dictation software. Please excuse any inadvertent transcription  errors. Case was discussed with supervising physician Dr. Ruther who is agreeable with current plan.       [1]  Current Facility-Administered Medications:    acetaminophen  (TYLENOL ) tablet 650 mg, 650 mg, Oral, Q6H PRN, Josette Ade, MD, 650 mg at 12/13/24 1137   acetaminophen -caffeine  (EXCEDRIN  TENSION HEADACHE) 500-65 MG per tablet 1 tablet, 1 tablet, Oral, Q8H PRN, Maree Hue, MD, 1 tablet at 12/13/24 1558   albuterol  (PROVENTIL ) (2.5 MG/3ML) 0.083% nebulizer solution 2.5 mg, 2.5 mg, Nebulization, Q2H PRN, Foust, Katy L, NP, 2.5 mg at 10/23/24 9176   ALPRAZolam  (XANAX ) tablet 0.25 mg, 0.25 mg, Oral, TID PRN, Maree Hue, MD, 0.25 mg at 12/14/24 0907   artificial tears ophthalmic solution 1 drop, 1 drop, Both Eyes, PRN, Foust, Katy L, NP   buPROPion  (WELLBUTRIN  XL) 24 hr tablet 150 mg, 150 mg, Oral, Daily, Wieting, Richard, MD, 150 mg at 12/14/24 0908   enoxaparin  (LOVENOX ) injection 40 mg, 40 mg, Subcutaneous, Q24H, Foust, Katy L, NP, 40 mg at 12/13/24 1833   escitalopram  (LEXAPRO ) tablet 20 mg, 20 mg, Oral, Daily, Amin, Sumayya, MD, 20 mg at 12/14/24 0907   guaiFENesin  (MUCINEX ) 12 hr tablet 600 mg, 600 mg,  Oral, BID, Foust, Katy L, NP, 600 mg at 12/14/24 0908   loratadine  (CLARITIN ) tablet 10 mg, 10 mg, Oral, Daily PRN, Pokhrel, Laxman, MD, 10 mg at 12/08/24 2013   melatonin tablet 5 mg, 5 mg, Oral, QHS PRN, Foust, Katy L, NP, 5 mg at 12/13/24 2135   multivitamin with minerals tablet 1 tablet, 1 tablet, Oral, Daily, Wieting, Richard, MD, 1 tablet at 12/14/24 9091   nicotine  (NICODERM CQ  - dosed in mg/24 hours) patch 14 mg, 14 mg, Transdermal, Daily, Foust, Katy L, NP, 14 mg at 12/14/24 0908   nicotine  polacrilex (NICORETTE ) gum 2 mg, 2 mg, Oral, PRN, Foust, Katy L, NP   Oral care mouth rinse, 15 mL, Mouth Rinse, PRN, Wieting, Richard, MD   pantoprazole  (PROTONIX ) EC tablet 40 mg, 40 mg, Oral, Daily, Foust, Katy L, NP, 40 mg at 12/14/24 0908   QUEtiapine  (SEROQUEL ) tablet 25 mg, 25  mg, Oral, QHS, Torien Ramroop B, NP, 25 mg at 12/13/24 2135   sodium chloride  (OCEAN) 0.65 % nasal spray 1 spray, 1 spray, Each Nare, PRN, Patel, Sona, MD   umeclidinium-vilanterol (ANORO ELLIPTA ) 62.5-25 MCG/ACT 1 puff, 1 puff, Inhalation, Daily, Amin, Sumayya, MD, 1 puff at 12/14/24 0741 [2] No Known Allergies

## 2024-12-14 NOTE — Plan of Care (Signed)
" °  Problem: Education: Goal: Knowledge of General Education information will improve Description: Including pain rating scale, medication(s)/side effects and non-pharmacologic comfort measures Outcome: Progressing   Problem: Clinical Measurements: Goal: Ability to maintain clinical measurements within normal limits will improve Outcome: Progressing   Problem: Activity: Goal: Risk for activity intolerance will decrease Outcome: Progressing   Problem: Nutrition: Goal: Adequate nutrition will be maintained Outcome: Progressing   Problem: Coping: Goal: Level of anxiety will decrease Outcome: Progressing   Problem: Elimination: Goal: Will not experience complications related to bowel motility Outcome: Progressing   Problem: Pain Managment: Goal: General experience of comfort will improve and/or be controlled Outcome: Progressing   Problem: Safety: Goal: Ability to remain free from injury will improve Outcome: Progressing   Problem: Skin Integrity: Goal: Risk for impaired skin integrity will decrease Outcome: Progressing   Problem: Activity: Goal: Ability to tolerate increased activity will improve Outcome: Progressing   "

## 2024-12-15 DIAGNOSIS — F32A Depression, unspecified: Secondary | ICD-10-CM | POA: Diagnosis not present

## 2024-12-15 DIAGNOSIS — J9622 Acute and chronic respiratory failure with hypercapnia: Secondary | ICD-10-CM | POA: Diagnosis not present

## 2024-12-15 DIAGNOSIS — J441 Chronic obstructive pulmonary disease with (acute) exacerbation: Secondary | ICD-10-CM | POA: Diagnosis not present

## 2024-12-15 DIAGNOSIS — J9621 Acute and chronic respiratory failure with hypoxia: Secondary | ICD-10-CM | POA: Diagnosis not present

## 2024-12-15 MED ORDER — BUPROPION HCL ER (XL) 150 MG PO TB24
150.0000 mg | ORAL_TABLET | Freq: Every day | ORAL | Status: DC
  Administered 2024-12-16 – 2024-12-21 (×6): 150 mg via ORAL
  Filled 2024-12-15 (×6): qty 1

## 2024-12-15 NOTE — Progress Notes (Signed)
 " Progress Note   Patient: Corey House. FMW:[id number] DOB: 04-08-1966 DOA: 10/20/2024     56 DOS: the patient was seen and examined on 12/15/2024   Brief hospital course: 58 y.o. year old male with past medical history of  tobacco use disorder reporting a 41 year pack per year history, smoking ~1 pack per day since he was 58 years old. He denies any additional medical history though he has not seen a medical provider in ~20 years. He [facility] to [facility] ED with shortness of breath that began ~ 9 months ago and worsened ~3 months ago. He reports today his dyspnea is not significantly different than it was 3 months ago. He endorses a chronic cough that has been present since childhood. Her ports the cough is currently, minimally productive with white phlegm. Additionally, he endorses bilateral lower extremity swelling that began in the last 2 days, per his report (R) > (L). On exam the swelling is equal bilaterally. He endorses fatigue and dyspnea that is worse with position changes such as bending over and when standing from lying. He denies chest pain, palpitations, or fever.   ED Course: On arrival to [facility] ED patient was noted to be afebrile temp 36.8C, BP 142/111, HR 88, RR 22, SPO2 100% on 15L non-rebreather. Patient now on 2L via nasal cannula with SPO2 96%. CXR obtained and shows non specific findings of increased interstitial markings. CTA PE obtained and shows no PE, dilated RV and reflux of contract into the IVC and hepatic vein consistent with (R) heart failure, chronic bronchitis, and emphysema. US  DVT (B)LE negative for DVT. Labs notable Negative COVID, flu, RSV,for BNP 284.2, BUN 24, troponin 41--> 38 with no report of chest pain.  Lactic acid 0.9.  Blood cultures drawn and in process.  He was given Lasix , DuoNebs, and Solu-Medrol .    Patient was initially treated in the hospital, condition has improved.  Currently just pending for placement.   Principal  Problem:   Depression Active Problems:   Acute on chronic respiratory failure with hypoxia and hypercapnia (HCC)   COPD with acute exacerbation (HCC)   Acute on chronic diastolic CHF (congestive heart failure) (HCC)   Tobacco use disorder   Headache   Increased anion gap metabolic acidosis   Hypernatremia   Protein-calorie malnutrition, severe   Skin pustule   Emphysema lung (HCC)   Assessment and Plan: Depression On Seroquel  at night as needed Xanax  and Lexapro .  Added Wellbutrin  on 12/22.  Could not tolerate higher dose.   Acute respiratory failure with hypoxia and hypercapnia (HCC)-resolved as of 12/02/2024 Pulse ox dropped to 82% with ambulation yesterday.  Awaiting pulse ox from today.  The patient is homeless will need to be able to come off oxygen prior to disposition.  Continue to try to see if we can get off oxygen on a daily basis.  VBG shows a pCO2 of 81.  Patient unable to tolerate BiPAP.  The patient likely has acute on chronic respiratory failure.  - Continue to require oxygen, need oxygen for home and he is currently homeless, cannot go to a shelter with oxygen.   Patient still desats when off oxygen.   COPD with acute exacerbation (HCC) Treated during the hospital course   Acute on chronic diastolic CHF (congestive heart failure) (HCC) EF 55 to 60%.  Off Lasix  at this time and stable   Tobacco use disorder On NicoDerm patch   Headache Improved.  Tylenol  as needed.  IV magnesium  given on 11/5.   Increased anion gap metabolic acidosis Resolved   Hypernatremia Resolved   Skin pustule Started doxycycline  on 12/18.  ANCA profile negative.   Condition is improving.   Protein-calorie malnutrition, severe Continue supplements        Subjective:  Patient does not have any complaints.  Still desats when oxygen is off.  Physical Exam: Vitals:   12/15/24 0500 12/15/24 0500 12/15/24 0502 12/15/24 0900  BP:  102/79 94/72 98/73   Pulse:  86 88 86  Resp:   15 15 16   Temp:  97.6 F (36.4 C) (!) 97.4 F (36.3 C) 97.7 F (36.5 C)  TempSrc:  Oral Oral Oral  SpO2:  (!) 74% (!) 76% 92%  Weight: 61.9 kg     Height:       General exam: Appears calm and comfortable  Respiratory system: Decreased breath sounds. Respiratory effort normal. Cardiovascular system: S1 & S2 heard, RRR. No JVD, murmurs, rubs, gallops or clicks. No pedal edema. Gastrointestinal system: Abdomen is nondistended, soft and nontender. No organomegaly or masses felt. Normal bowel sounds heard. Central nervous system: Alert and oriented. No focal neurological deficits. Extremities: Symmetric 5 x 5 power. Skin: No rashes, lesions or ulcers Psychiatry: Judgement and insight appear normal. Mood & affect appropriate.     Data Reviewed:  There are no new results to review at this time.  Family Communication: None  Disposition: Status is: Inpatient Remains inpatient appropriate because: Unsafe discharge.     Time spent: 35 minutes  Author: Murvin Mana, MD 12/15/2024 1:38 PM  For on call review [email or web address].    "

## 2024-12-15 NOTE — Plan of Care (Signed)

## 2024-12-16 DIAGNOSIS — F17200 Nicotine dependence, unspecified, uncomplicated: Secondary | ICD-10-CM | POA: Diagnosis not present

## 2024-12-16 DIAGNOSIS — I5033 Acute on chronic diastolic (congestive) heart failure: Secondary | ICD-10-CM | POA: Diagnosis not present

## 2024-12-16 DIAGNOSIS — L089 Local infection of the skin and subcutaneous tissue, unspecified: Secondary | ICD-10-CM | POA: Diagnosis not present

## 2024-12-16 DIAGNOSIS — J441 Chronic obstructive pulmonary disease with (acute) exacerbation: Secondary | ICD-10-CM | POA: Diagnosis not present

## 2024-12-16 DIAGNOSIS — J9622 Acute and chronic respiratory failure with hypercapnia: Secondary | ICD-10-CM | POA: Diagnosis not present

## 2024-12-16 DIAGNOSIS — E43 Unspecified severe protein-calorie malnutrition: Secondary | ICD-10-CM | POA: Diagnosis not present

## 2024-12-16 DIAGNOSIS — J9621 Acute and chronic respiratory failure with hypoxia: Secondary | ICD-10-CM | POA: Diagnosis not present

## 2024-12-16 DIAGNOSIS — E870 Hyperosmolality and hypernatremia: Secondary | ICD-10-CM | POA: Diagnosis not present

## 2024-12-16 DIAGNOSIS — E8729 Other acidosis: Secondary | ICD-10-CM | POA: Diagnosis not present

## 2024-12-16 DIAGNOSIS — F3289 Other specified depressive episodes: Secondary | ICD-10-CM | POA: Diagnosis not present

## 2024-12-16 NOTE — Progress Notes (Signed)
 " Progress Note   Patient: Corey House. FMW:[id number] DOB: September 22, 1966 DOA: 10/20/2024     58 DOS: the patient was seen and examined on 12/16/2024   Brief hospital course: 58 y.o. year old male with past medical history of  tobacco use disorder reporting a 41 year pack per year history, smoking ~1 pack per day since he was 58 years old. He denies any additional medical history though he has not seen a medical provider in ~20 years. He presents to [facility] ED with shortness of breath that began ~ 9 months ago and worsened ~3 months ago. He reports today his dyspnea is not significantly different than it was 3 months ago. He endorses a chronic cough that has been present since childhood. Her ports the cough is currently, minimally productive with white phlegm. Additionally, he endorses bilateral lower extremity swelling that began in the last 2 days, per his report (R) > (L). On exam the swelling is equal bilaterally. He endorses fatigue and dyspnea that is worse with position changes such as bending over and when standing from lying. He denies chest pain, palpitations, or fever.   ED Course: On arrival to [facility] ED patient was noted to be afebrile temp 36.8C, BP 142/111, HR 88, RR 22, SPO2 100% on 15L non-rebreather. Patient now on 2L via nasal cannula with SPO2 96%. CXR obtained and shows non specific findings of increased interstitial markings. CTA PE obtained and shows no PE, dilated RV and reflux of contract into the IVC and hepatic vein consistent with (R) heart failure, chronic bronchitis, and emphysema. US  DVT (B)LE negative for DVT. Labs notable Negative COVID, flu, RSV,for BNP 284.2, BUN 24, troponin 41--> 38 with no report of chest pain.  Lactic acid 0.9.  Blood cultures drawn and in process.  He was given Lasix , DuoNebs, and Solu-Medrol .    Patient was initially treated in the hospital, condition has improved.  Currently just pending for placement.  Assessment and  Plan: * Acute on chronic respiratory failure with hypoxia and hypercapnia (HCC) Multiple attempts were made to wean the patient from oxygen during the hospital.  Pulse ox drops with ambulation (down to 85% last week).  Patient unable to tolerate BiPAP.  Currently on 2 L.  Depression On Seroquel  at night as needed Xanax  and Lexapro .  Added Wellbutrin  on 12/22.  Appreciate psychiatric reconsult.   COPD with acute exacerbation (HCC) Treated during the hospital course  Acute on chronic diastolic CHF (congestive heart failure) (HCC) EF 55 to 60%.  Off Lasix  at this time and stable  Tobacco use disorder On NicoDerm patch  Headache Improved.  Tylenol  as needed.  IV magnesium  given on 11/5.  Increased anion gap metabolic acidosis Resolved  Hypernatremia Resolved  Skin pustule Resolved  Protein-calorie malnutrition, severe Continue supplements        Subjective: Patient feels okay.  Mood better than last week.  Admitted with acute respiratory failure and COPD exacerbation 57 days ago.  Physical Exam: Vitals:   12/15/24 1655 12/15/24 2000 12/16/24 0433 12/16/24 0834  BP: 119/79 116/88 110/72 109/81  Pulse: 82 88 80 86  Resp: 16 18 17 19   Temp: 98.3 F (36.8 C) 98 F (36.7 C) 99.1 F (37.3 C) 98.1 F (36.7 C)  TempSrc: Oral Oral Oral   SpO2: 92% 91% 93% 92%  Weight:   62.9 kg   Height:       Physical Exam HENT:     Head: Normocephalic.  Eyes:  General: Lids are normal.     Conjunctiva/sclera: Conjunctivae normal.  Cardiovascular:     Rate and Rhythm: Normal rate and regular rhythm.     Heart sounds: Normal heart sounds, S1 normal and S2 normal.  Pulmonary:     Breath sounds: Examination of the right-lower field reveals decreased breath sounds. Examination of the left-lower field reveals decreased breath sounds. Decreased breath sounds present. No wheezing, rhonchi or rales.  Abdominal:     Palpations: Abdomen is soft.     Tenderness: There is no abdominal  tenderness.  Musculoskeletal:     Right lower leg: No swelling.     Left lower leg: No swelling.  Skin:    General: Skin is warm.  Neurological:     Mental Status: He is alert.     Data Reviewed: No new data  Disposition: Status is: Inpatient Remains inpatient appropriate because: Unfortunately patient is homeless and requires 24/7 oxygen.  Needs some sort of placement  Planned Discharge Destination: To be determined    Time spent: 27 minutes  Author: Charlie Patterson, MD 12/16/2024 12:15 PM  For on call review [email or web address]  "

## 2024-12-16 NOTE — Plan of Care (Signed)

## 2024-12-17 DIAGNOSIS — F3289 Other specified depressive episodes: Secondary | ICD-10-CM | POA: Diagnosis not present

## 2024-12-17 DIAGNOSIS — E870 Hyperosmolality and hypernatremia: Secondary | ICD-10-CM | POA: Diagnosis not present

## 2024-12-17 DIAGNOSIS — I5033 Acute on chronic diastolic (congestive) heart failure: Secondary | ICD-10-CM | POA: Diagnosis not present

## 2024-12-17 DIAGNOSIS — J441 Chronic obstructive pulmonary disease with (acute) exacerbation: Secondary | ICD-10-CM | POA: Diagnosis not present

## 2024-12-17 DIAGNOSIS — E43 Unspecified severe protein-calorie malnutrition: Secondary | ICD-10-CM | POA: Diagnosis not present

## 2024-12-17 DIAGNOSIS — F17200 Nicotine dependence, unspecified, uncomplicated: Secondary | ICD-10-CM | POA: Diagnosis not present

## 2024-12-17 DIAGNOSIS — J9622 Acute and chronic respiratory failure with hypercapnia: Secondary | ICD-10-CM | POA: Diagnosis not present

## 2024-12-17 DIAGNOSIS — J9621 Acute and chronic respiratory failure with hypoxia: Secondary | ICD-10-CM | POA: Diagnosis not present

## 2024-12-17 DIAGNOSIS — E8729 Other acidosis: Secondary | ICD-10-CM | POA: Diagnosis not present

## 2024-12-17 NOTE — Progress Notes (Signed)
 " Progress Note   Patient: Corey House. FMW:[id number] DOB: 12-Feb-1966 DOA: 10/20/2024     59 DOS: the patient was seen and examined on 12/17/2024   Brief hospital course: 59 y.o. year old male with past medical history of  tobacco use disorder reporting a 41 year pack per year history, smoking ~1 pack per day since he was 59 years old. He denies any additional medical history though he has not seen a medical provider in ~20 years. He presents to [facility] ED with shortness of breath that began ~ 9 months ago and worsened ~3 months ago. He reports today his dyspnea is not significantly different than it was 3 months ago. He endorses a chronic cough that has been present since childhood. Her ports the cough is currently, minimally productive with white phlegm. Additionally, he endorses bilateral lower extremity swelling that began in the last 2 days, per his report (R) > (L). On exam the swelling is equal bilaterally. He endorses fatigue and dyspnea that is worse with position changes such as bending over and when standing from lying. He denies chest pain, palpitations, or fever.   ED Course: On arrival to [facility] ED patient was noted to be afebrile temp 36.8C, BP 142/111, HR 88, RR 22, SPO2 100% on 15L non-rebreather. Patient now on 2L via nasal cannula with SPO2 96%. CXR obtained and shows non specific findings of increased interstitial markings. CTA PE obtained and shows no PE, dilated RV and reflux of contract into the IVC and hepatic vein consistent with (R) heart failure, chronic bronchitis, and emphysema. US  DVT (B)LE negative for DVT. Labs notable Negative COVID, flu, RSV,for BNP 284.2, BUN 24, troponin 41--> 38 with no report of chest pain.  Lactic acid 0.9.  Blood cultures drawn and in process.  He was given Lasix , DuoNebs, and Solu-Medrol .    Patient was initially treated in the hospital, condition has improved.  Currently just pending for placement.  Assessment and  Plan: * Acute on chronic respiratory failure with hypoxia and hypercapnia (HCC) Multiple attempts were made to wean the patient from oxygen during the hospital.  Pulse ox drops with ambulation (down to 85% last week).  Patient unable to tolerate BiPAP.  Currently on 2 L.  Depression On Seroquel  at night as needed Xanax  and Lexapro .  Added Wellbutrin  on 12/22.  Appreciate psychiatric reconsult.   COPD with acute exacerbation (HCC) Treated during the hospital course  Acute on chronic diastolic CHF (congestive heart failure) (HCC) EF 55 to 60%.  Off Lasix  at this time and stable  Tobacco use disorder On NicoDerm patch  Headache Improved.  Tylenol  as needed.  IV magnesium  given on 11/5.  Increased anion gap metabolic acidosis Resolved  Hypernatremia Resolved  Skin pustule Resolved  Protein-calorie malnutrition, severe Continue supplements        Subjective: Patient feels okay.  States he has been ambulating.  States his mood is better than last week.  Admitted to 58 days ago with COPD exacerbation  Physical Exam: Vitals:   12/16/24 2048 12/17/24 0340 12/17/24 0436 12/17/24 0901  BP:   103/72 108/78  Pulse:   81 86  Resp:   18 18  Temp:   (!) 97.5 F (36.4 C) 98.1 F (36.7 C)  TempSrc:   Oral   SpO2: 92%  91% 91%  Weight:  62.5 kg    Height:       Physical Exam HENT:     Head: Normocephalic.  Eyes:  General: Lids are normal.     Conjunctiva/sclera: Conjunctivae normal.  Cardiovascular:     Rate and Rhythm: Normal rate and regular rhythm.     Heart sounds: Normal heart sounds, S1 normal and S2 normal.  Pulmonary:     Breath sounds: Examination of the right-lower field reveals decreased breath sounds. Examination of the left-lower field reveals decreased breath sounds. Decreased breath sounds present. No wheezing, rhonchi or rales.  Abdominal:     Palpations: Abdomen is soft.     Tenderness: There is no abdominal tenderness.  Musculoskeletal:     Right  lower leg: No swelling.     Left lower leg: No swelling.  Skin:    General: Skin is warm.  Neurological:     Mental Status: He is alert.     Data Reviewed: No new data  Disposition: Status is: Inpatient Remains inpatient appropriate because: Patient is homeless but requires 24/7 oxygen.  Will need some sort of housing  Planned Discharge Destination: Will need some sort of housing    Time spent: 26 minutes  Author: Charlie Patterson, MD 12/17/2024 12:34 PM  For on call review [email or web address]  "

## 2024-12-18 DIAGNOSIS — F3289 Other specified depressive episodes: Secondary | ICD-10-CM | POA: Diagnosis not present

## 2024-12-18 DIAGNOSIS — J9621 Acute and chronic respiratory failure with hypoxia: Secondary | ICD-10-CM | POA: Diagnosis not present

## 2024-12-18 DIAGNOSIS — I5033 Acute on chronic diastolic (congestive) heart failure: Secondary | ICD-10-CM | POA: Diagnosis not present

## 2024-12-18 DIAGNOSIS — J441 Chronic obstructive pulmonary disease with (acute) exacerbation: Secondary | ICD-10-CM | POA: Diagnosis not present

## 2024-12-18 MED ORDER — QUETIAPINE FUMARATE 25 MG PO TABS
50.0000 mg | ORAL_TABLET | Freq: Every day | ORAL | Status: AC
  Administered 2024-12-18 – 2025-01-22 (×36): 50 mg via ORAL
  Filled 2024-12-18 (×36): qty 2

## 2024-12-18 NOTE — TOC Progression Note (Signed)
 Transition of Care (TOC) - Progression Note    Patient Details  Name: Corey House. MRN: [id number] Date of Birth: 1966-07-02  Transition of Care [facility]) CM/SW Contact  Delphine KANDICE Bring, RN Phone Number: 12/18/2024, 5:02 PM  Clinical Narrative:    CM received a call from [place] at [facility]. She states that the disability was submitted on 12/23 and his ID has been mailed to them   Expected Discharge Plan:  (patient is currently homeless, unsure of discharge location at this time) Barriers to Discharge: Financial Resources               Expected Discharge Plan and Services   Discharge Planning Services: CM Consult   Living arrangements for the past 2 months: Homeless Expected Discharge Date: 11/03/24                                     Social Drivers of Health (SDOH) Interventions SDOH Screenings   Food Insecurity: Food Insecurity Present (10/20/2024)  Housing: High Risk (10/21/2024)  Transportation Needs: Unmet Transportation Needs (10/20/2024)  Utilities: At Risk (10/20/2024)  Alcohol  Screen: Low Risk (04/23/2024)  Social Connections: Unknown (04/23/2024)  Tobacco Use: High Risk (10/21/2024)    Readmission Risk Interventions     No data to display

## 2024-12-18 NOTE — Plan of Care (Signed)

## 2024-12-18 NOTE — Progress Notes (Signed)
 " Progress Note   Patient: Corey House. FMW:[id number] DOB: 1966/01/20 DOA: 10/20/2024     60 DOS: the patient was seen and examined on 12/18/2024   Brief hospital course: 59 y.o. year old male with past medical history of  tobacco use disorder reporting a 41 year pack per year history, smoking ~1 pack per day since he was 59 years old. He denies any additional medical history though he has not seen a medical provider in ~20 years. He presents to [facility] ED with shortness of breath that began ~ 9 months ago and worsened ~3 months ago. He reports today his dyspnea is not significantly different than it was 3 months ago. He endorses a chronic cough that has been present since childhood. Her ports the cough is currently, minimally productive with white phlegm. Additionally, he endorses bilateral lower extremity swelling that began in the last 2 days, per his report (R) > (L). On exam the swelling is equal bilaterally. He endorses fatigue and dyspnea that is worse with position changes such as bending over and when standing from lying. He denies chest pain, palpitations, or fever.   ED Course: On arrival to [facility] ED patient was noted to be afebrile temp 36.8C, BP 142/111, HR 88, RR 22, SPO2 100% on 15L non-rebreather. Patient now on 2L via nasal cannula with SPO2 96%. CXR obtained and shows non specific findings of increased interstitial markings. CTA PE obtained and shows no PE, dilated RV and reflux of contract into the IVC and hepatic vein consistent with (R) heart failure, chronic bronchitis, and emphysema. US  DVT (B)LE negative for DVT. Labs notable Negative COVID, flu, RSV,for BNP 284.2, BUN 24, troponin 41--> 38 with no report of chest pain.  Lactic acid 0.9.  Blood cultures drawn and in process.  He was given Lasix , DuoNebs, and Solu-Medrol .    Patient was initially treated in the hospital, condition has improved.  Currently just pending for placement.  Assessment and  Plan: * Acute on chronic respiratory failure with hypoxia and hypercapnia (HCC) Multiple attempts were made to wean the patient from oxygen during the hospital.  Pulse ox drops with ambulation (down to 85% last week).  Patient unable to tolerate BiPAP.  Currently on 2 L.  Depression Increase Seroquel  to 50 mg at night secondary to not sleeping last night.  On Xanax  and Lexapro .  Added Wellbutrin  on 12/22.  Mood better this week.  COPD with acute exacerbation (HCC) Treated during the hospital course  Acute on chronic diastolic CHF (congestive heart failure) (HCC) EF 55 to 60%.  Off Lasix  at this time and stable  Tobacco use disorder On NicoDerm patch  Headache Improved.  Tylenol  as needed.  IV magnesium  given on 11/5.  Increased anion gap metabolic acidosis Resolved  Hypernatremia Resolved  Protein-calorie malnutrition, severe Continue supplements        Subjective: Patient sleeping this morning.  Did not sleep well last night.  Admitted 59 days ago with COPD exacerbation.  Physical Exam: Vitals:   12/17/24 2014 12/18/24 0356 12/18/24 0357 12/18/24 0832  BP: 104/76 104/71  117/69  Pulse: 86 92  88  Resp: 18 20  18   Temp: 98.1 F (36.7 C) 98.3 F (36.8 C)  98.5 F (36.9 C)  TempSrc: Oral   Oral  SpO2: 91% 92%  91%  Weight:   62.3 kg   Height:       Physical Exam HENT:     Head: Normocephalic.  Mouth/Throat:     Pharynx: No oropharyngeal exudate.  Cardiovascular:     Rate and Rhythm: Normal rate and regular rhythm.     Heart sounds: Normal heart sounds, S1 normal and S2 normal.  Pulmonary:     Breath sounds: Examination of the right-lower field reveals decreased breath sounds. Examination of the left-lower field reveals decreased breath sounds. Decreased breath sounds present. No wheezing, rhonchi or rales.  Abdominal:     Palpations: Abdomen is soft.     Tenderness: There is no abdominal tenderness.  Musculoskeletal:     Right lower leg: No swelling.      Left lower leg: No swelling.  Skin:    General: Skin is warm.  Neurological:     Mental Status: He is alert.     Data Reviewed: No new data  Disposition: Status is: Inpatient Remains inpatient appropriate because: Await placement.  Patient is homeless but requires 24/7 oxygen  Planned Discharge Destination: Will need placement    Time spent: 24 minutes  Author: Charlie Patterson, MD 12/18/2024 12:11 PM  For on call review [email or web address]  "

## 2024-12-19 DIAGNOSIS — F3289 Other specified depressive episodes: Secondary | ICD-10-CM | POA: Diagnosis not present

## 2024-12-19 DIAGNOSIS — J9621 Acute and chronic respiratory failure with hypoxia: Secondary | ICD-10-CM | POA: Diagnosis not present

## 2024-12-19 DIAGNOSIS — J441 Chronic obstructive pulmonary disease with (acute) exacerbation: Secondary | ICD-10-CM | POA: Diagnosis not present

## 2024-12-19 DIAGNOSIS — I5033 Acute on chronic diastolic (congestive) heart failure: Secondary | ICD-10-CM | POA: Diagnosis not present

## 2024-12-19 NOTE — Progress Notes (Signed)
 " Progress Note   Patient: Corey House. FMW:[id number] DOB: 09-Sep-1966 DOA: 10/20/2024     61 DOS: the patient was seen and examined on 12/19/2024   Brief hospital course: 59 y.o. year old male with past medical history of  tobacco use disorder reporting a 41 year pack per year history, smoking ~1 pack per day since he was 59 years old. He denies any additional medical history though he has not seen a medical provider in ~20 years. He presents to [facility] ED with shortness of breath that began ~ 9 months ago and worsened ~3 months ago. He reports today his dyspnea is not significantly different than it was 3 months ago. He endorses a chronic cough that has been present since childhood. Her ports the cough is currently, minimally productive with white phlegm. Additionally, he endorses bilateral lower extremity swelling that began in the last 2 days, per his report (R) > (L). On exam the swelling is equal bilaterally. He endorses fatigue and dyspnea that is worse with position changes such as bending over and when standing from lying. He denies chest pain, palpitations, or fever.   ED Course: On arrival to [facility] ED patient was noted to be afebrile temp 36.8C, BP 142/111, HR 88, RR 22, SPO2 100% on 15L non-rebreather. Patient now on 2L via nasal cannula with SPO2 96%. CXR obtained and shows non specific findings of increased interstitial markings. CTA PE obtained and shows no PE, dilated RV and reflux of contract into the IVC and hepatic vein consistent with (R) heart failure, chronic bronchitis, and emphysema. US  DVT (B)LE negative for DVT. Labs notable Negative COVID, flu, RSV,for BNP 284.2, BUN 24, troponin 41--> 38 with no report of chest pain.  Lactic acid 0.9.  Blood cultures drawn and in process.  He was given Lasix , DuoNebs, and Solu-Medrol .    Patient was initially treated in the hospital, condition has improved.  Currently just pending for placement.  Assessment and  Plan: * Acute on chronic respiratory failure with hypoxia and hypercapnia (HCC) Multiple attempts were made to wean the patient from oxygen during the hospital.  Pulse ox drops with ambulation (down to 85% last week).  Patient unable to tolerate BiPAP.  Currently on 2 L.  Depression Increase Seroquel  to 50 mg at night on 12/18/2024.  On Xanax  and Lexapro .  Added Wellbutrin  on 12/22.  Mood better this week.  COPD with acute exacerbation (HCC) Treated during the hospital course  Acute on chronic diastolic CHF (congestive heart failure) (HCC) EF 55 to 60%.  Off Lasix  at this time and stable  Tobacco use disorder On NicoDerm patch  Headache Improved.  Tylenol  as needed.  IV magnesium  given on 11/5.  Increased anion gap metabolic acidosis Resolved  Hypernatremia Resolved  Protein-calorie malnutrition, severe Continue supplements        Subjective: Patient slept a little bit better yesterday.  Patient on increased dose of Seroquel  at night.  Still feels tired.  Admitted 60 days ago with COPD exacerbation  Physical Exam: Vitals:   12/18/24 1545 12/19/24 0442 12/19/24 0500 12/19/24 0800  BP: 107/77 99/74  97/68  Pulse: 84 82  71  Resp: 18 18  16   Temp: 98.4 F (36.9 C) 97.6 F (36.4 C)  97.8 F (36.6 C)  TempSrc:      SpO2: 92% 91%  (!) 89%  Weight:   62.3 kg   Height:       Physical Exam HENT:  Head: Normocephalic.     Mouth/Throat:     Pharynx: No oropharyngeal exudate.  Cardiovascular:     Rate and Rhythm: Normal rate and regular rhythm.     Heart sounds: Normal heart sounds, S1 normal and S2 normal.  Pulmonary:     Breath sounds: Examination of the right-lower field reveals decreased breath sounds. Examination of the left-lower field reveals decreased breath sounds. Decreased breath sounds present. No wheezing, rhonchi or rales.  Abdominal:     Palpations: Abdomen is soft.     Tenderness: There is no abdominal tenderness.  Musculoskeletal:     Right lower  leg: No swelling.     Left lower leg: No swelling.  Skin:    General: Skin is warm.  Neurological:     Mental Status: He is alert.     Data Reviewed: No new data   Disposition: Status is: Inpatient Remains inpatient appropriate because: Patient is homeless but needs 24/7 oxygen.  Planned Discharge Destination: Will need some sort of placement    Time spent: 26 minutes  Author: Charlie Patterson, MD 12/19/2024 11:50 AM  For on call review [email or web address]  "

## 2024-12-20 DIAGNOSIS — J441 Chronic obstructive pulmonary disease with (acute) exacerbation: Secondary | ICD-10-CM | POA: Diagnosis not present

## 2024-12-20 DIAGNOSIS — J9621 Acute and chronic respiratory failure with hypoxia: Secondary | ICD-10-CM | POA: Diagnosis not present

## 2024-12-20 DIAGNOSIS — F3289 Other specified depressive episodes: Secondary | ICD-10-CM | POA: Diagnosis not present

## 2024-12-20 DIAGNOSIS — I5033 Acute on chronic diastolic (congestive) heart failure: Secondary | ICD-10-CM | POA: Diagnosis not present

## 2024-12-20 NOTE — Plan of Care (Signed)

## 2024-12-20 NOTE — Progress Notes (Signed)
 " Progress Note   Patient: Corey House. FMW:[id number] DOB: 27-Feb-1966 DOA: 10/20/2024     62 DOS: the patient was seen and examined on 12/20/2024   Brief hospital course: 59 y.o. year old male with past medical history of  tobacco use disorder reporting a 41 year pack per year history, smoking ~1 pack per day since he was 59 years old. He denies any additional medical history though he has not seen a medical provider in ~20 years. He presents to [facility] ED with shortness of breath that began ~ 9 months ago and worsened ~3 months ago. He reports today his dyspnea is not significantly different than it was 3 months ago. He endorses a chronic cough that has been present since childhood. Her ports the cough is currently, minimally productive with white phlegm. Additionally, he endorses bilateral lower extremity swelling that began in the last 2 days, per his report (R) > (L). On exam the swelling is equal bilaterally. He endorses fatigue and dyspnea that is worse with position changes such as bending over and when standing from lying. He denies chest pain, palpitations, or fever.   ED Course: On arrival to [facility] ED patient was noted to be afebrile temp 36.8C, BP 142/111, HR 88, RR 22, SPO2 100% on 15L non-rebreather. Patient now on 2L via nasal cannula with SPO2 96%. CXR obtained and shows non specific findings of increased interstitial markings. CTA PE obtained and shows no PE, dilated RV and reflux of contract into the IVC and hepatic vein consistent with (R) heart failure, chronic bronchitis, and emphysema. US  DVT (B)LE negative for DVT. Labs notable Negative COVID, flu, RSV,for BNP 284.2, BUN 24, troponin 41--> 38 with no report of chest pain.  Lactic acid 0.9.  Blood cultures drawn and in process.  He was given Lasix , DuoNebs, and Solu-Medrol .    Patient was initially treated in the hospital, condition has improved.  Currently just pending for placement.  Assessment and  Plan: * Acute on chronic respiratory failure with hypoxia and hypercapnia (HCC) Multiple attempts were made to wean the patient from oxygen during the hospital.  Pulse ox drops with ambulation (down to 85% last week).  Patient unable to tolerate BiPAP.  Currently on 2 L.  Depression Increased Seroquel  to 50 mg at night on 12/18/2024.  On Xanax  and Lexapro .  Added Wellbutrin  on 12/22.  Mood better this week.  COPD with acute exacerbation (HCC) Treated during the hospital course  Acute on chronic diastolic CHF (congestive heart failure) (HCC) EF 55 to 60%.  Off Lasix  at this time and stable  Tobacco use disorder On NicoDerm patch  Headache Improved.  Tylenol  as needed.  IV magnesium  given on 11/5.  Increased anion gap metabolic acidosis Resolved  Hypernatremia Resolved  Protein-calorie malnutrition, severe Continue supplements        Subjective: Patient admitted 60 days ago with COPD exacerbation.  Patient feels okay.  Offers no complaints.  Breathing fine.  Slept well last night.  Physical Exam: Vitals:   12/19/24 1534 12/19/24 2030 12/20/24 0436 12/20/24 0500  BP: 113/78 106/70 108/76   Pulse: 81 71 69   Resp: 16 18 18    Temp: 98.5 F (36.9 C) 97.8 F (36.6 C) 97.6 F (36.4 C)   TempSrc:      SpO2: 93% 94% 95%   Weight:    62.3 kg  Height:       Physical Exam HENT:     Head: Normocephalic.  Cardiovascular:  Rate and Rhythm: Normal rate and regular rhythm.     Heart sounds: Normal heart sounds, S1 normal and S2 normal.  Pulmonary:     Breath sounds: Examination of the right-lower field reveals decreased breath sounds. Examination of the left-lower field reveals decreased breath sounds. Decreased breath sounds present. No wheezing, rhonchi or rales.  Abdominal:     Palpations: Abdomen is soft.     Tenderness: There is no abdominal tenderness.  Musculoskeletal:     Right lower leg: No swelling.     Left lower leg: No swelling.  Skin:    General: Skin is  warm.  Neurological:     Mental Status: He is alert.     Data Reviewed: No new data  Disposition: Status is: Inpatient Remains inpatient appropriate because: Unfortunately patient is homeless and requires 24/7 oxygen.  Will need some sort of placement.  Planned Discharge Destination: Will need some sort of placement    Time spent: 25 minutes  Author: Charlie Patterson, MD 12/20/2024 10:37 AM  For on call review [email or web address]  "

## 2024-12-21 DIAGNOSIS — F17200 Nicotine dependence, unspecified, uncomplicated: Secondary | ICD-10-CM | POA: Diagnosis not present

## 2024-12-21 DIAGNOSIS — J441 Chronic obstructive pulmonary disease with (acute) exacerbation: Secondary | ICD-10-CM | POA: Diagnosis not present

## 2024-12-21 DIAGNOSIS — F3289 Other specified depressive episodes: Secondary | ICD-10-CM | POA: Diagnosis not present

## 2024-12-21 DIAGNOSIS — J9622 Acute and chronic respiratory failure with hypercapnia: Secondary | ICD-10-CM | POA: Diagnosis not present

## 2024-12-21 DIAGNOSIS — E870 Hyperosmolality and hypernatremia: Secondary | ICD-10-CM | POA: Diagnosis not present

## 2024-12-21 DIAGNOSIS — J9621 Acute and chronic respiratory failure with hypoxia: Secondary | ICD-10-CM | POA: Diagnosis not present

## 2024-12-21 DIAGNOSIS — R110 Nausea: Secondary | ICD-10-CM | POA: Diagnosis not present

## 2024-12-21 DIAGNOSIS — I5033 Acute on chronic diastolic (congestive) heart failure: Secondary | ICD-10-CM | POA: Diagnosis not present

## 2024-12-21 DIAGNOSIS — E43 Unspecified severe protein-calorie malnutrition: Secondary | ICD-10-CM | POA: Diagnosis not present

## 2024-12-21 DIAGNOSIS — E8729 Other acidosis: Secondary | ICD-10-CM | POA: Diagnosis not present

## 2024-12-21 DIAGNOSIS — D75839 Thrombocytosis, unspecified: Secondary | ICD-10-CM | POA: Diagnosis not present

## 2024-12-21 LAB — BASIC METABOLIC PANEL WITH GFR
Anion gap: 9 (ref 5–15)
BUN: 15 mg/dL (ref 6–20)
CO2: 29 mmol/L (ref 22–32)
Calcium: 9.5 mg/dL (ref 8.9–10.3)
Chloride: 103 mmol/L (ref 98–111)
Creatinine, Ser: 0.76 mg/dL (ref 0.61–1.24)
GFR, Estimated: >60 mL/min
Glucose, Bld: 103 mg/dL — ABNORMAL HIGH (ref 70–99)
Potassium: 4 mmol/L (ref 3.5–5.1)
Sodium: 141 mmol/L (ref 135–145)

## 2024-12-21 LAB — CBC
HCT: 38.5 % — ABNORMAL LOW (ref 39.0–52.0)
Hemoglobin: 12.6 g/dL — ABNORMAL LOW (ref 13.0–17.0)
MCH: 29.6 pg (ref 26.0–34.0)
MCHC: 32.7 g/dL (ref 30.0–36.0)
MCV: 90.6 fL (ref 80.0–100.0)
Platelets: 427 K/uL — ABNORMAL HIGH (ref 150–400)
RBC: 4.25 MIL/uL (ref 4.22–5.81)
RDW: 13.5 % (ref 11.5–15.5)
WBC: 5.2 K/uL (ref 4.0–10.5)
nRBC: 0 % (ref 0.0–0.2)

## 2024-12-21 MED ORDER — ONDANSETRON HCL 4 MG PO TABS
4.0000 mg | ORAL_TABLET | Freq: Three times a day (TID) | ORAL | Status: AC | PRN
  Administered 2024-12-21 – 2025-01-21 (×3): 4 mg via ORAL
  Filled 2024-12-21 (×2): qty 1

## 2024-12-21 MED ORDER — ONDANSETRON HCL 4 MG/2ML IJ SOLN: 4.0000 mg | Freq: Four times a day (QID) | INTRAMUSCULAR | Status: DC | PRN

## 2024-12-21 NOTE — Assessment & Plan Note (Addendum)
 Resolved.  Patient believes this was secondary to Wellbutrin  which was discontinued.

## 2024-12-21 NOTE — Assessment & Plan Note (Signed)
 Continue to watch intermittently

## 2024-12-21 NOTE — Consult Note (Signed)
 [facility] Psychiatric Consult Follow Up  Patient Name: .Codie Krogh.  MRN: [id number]  DOB: 1966-01-22  Consult Order details:  Orders (From admission, onward)     Start     Ordered   12/07/24 1131  IP CONSULT TO PSYCHIATRY       Comments: Dr Josette savoy  Ordering Provider: Josette Ade, MD  Provider:  Lynnann Merle, NP  Question Answer Comment  Location [facility]   Reason for Consult? depression      12/07/24 1130   10/21/24 1005  IP CONSULT TO PSYCHIATRY       Comments: Dr Josette savoy  Ordering Provider: Josette Ade, MD  Provider:  Donnelly Mellow, MD  Question Answer Comment  Location [facility]   Reason for Consult? lost will to live      10/21/24 1005             Mode of Visit: In person    Psychiatry Consult Evaluation  Service Date: December 21, 2024 LOS:  LOS: 62 days  Chief Complaint I just had a bad day  Primary Psychiatric Diagnoses  Depression   Assessment   Alonzo Owczarzak. is a 59 y.o. male admitted: Medicallyfor 10/20/2024 10:47 AM for shortness of breath . He carries the psychiatric diagnoses of Depression/anxiety and has a past medical history of  tobacco use disorder reporting a 41 year pack per year history, smoking ~1 pack per day since he was 59 years old. He denies any additional medical history though he has not seen a medical provider in ~20 years.     Patient is medically hospitalized with comorbid depression and anxiety, currently experiencing housing instability. He demonstrates fair insight and is engaged with treatment planning. Current medication regimen includes Lexapro  with recent addition of Wellbutrin  for augmentation and smoking cessation support. Psychiatry will continue to monitor patient throughout medical admission and follow response to newly initiated Wellbutrin . Will defer addition of BuSpar at this time per patient preference, allowing trial period to  assess Wellbutrin  efficacy. Will reach out to SW to assist with arrangement of appropriate outpatient mental health resources. Will continue to monitor tolerance to current medication regimen and assess for any emerging side effects. Psychiatry will continue to round.  Currently, patient denies suicidal or homicidal ideations.  Patient denies auditory or visual hallucinations as well.On current presentation there was no evidence of psychosis or mania and patient did not appear to be responding to internal stimuli. At this time, patient does not appear to be a risk to self or others.While future psychiatric events cannot be accurately predicted, the patient does not currently require acute inpatient psychiatric care and does not currently meet [place]  involuntary commitment criteria.   12/10/2024: Patient was seen on rounds today by psychiatry. Patient denied suicidal or homicidal ideation. He denied auditory or visual hallucinations. Patient was recently started on Wellbutrin  therapy by the medical team. Patient reported continued increased anxiety, however he acknowledges that he has not been on the new medication long enough to determine efficacy. Patient did report overall improvement in mood from his initial presentation. He denied any noted side effects of his current medication regimen.  On mental status examination, patient was alert and oriented to person, place, time, and situation and remained calm and cooperative with the psychiatry team during assessment. Patient was not displaying any signs or symptoms of psychosis or mania and did not appear to be responding to internal stimuli.  Patient continues  to be agreeable with the social work team regarding setting him up with appropriate outpatient psychiatric resources once his disposition location and discharge details are determined. While future psychiatric events cannot be accurately predicted, the patient does not currently require acute  inpatient psychiatric care and does not currently meet [place]  involuntary commitment criteria.   12/14/2024: Patient was seen on rounds today by psychiatry. Patient reported improvement in his mood and is doing okay overall. Patient denied current suicidal or homicidal ideation. He denied auditory or visual hallucinations. Patient did still endorse some depressive symptoms but attributed this to his current situation, including his physical health concerns and social factors. Patient was very calm and cooperative with the psychiatry team.  As previously documented, the medical team added Wellbutrin  to patient's current medication regimen. Patient reported tolerating this medication well at this time and denied any noted side effects. Patient declined any further medication management changes at this time, as he wishes to allow the recent medication adjustments adequate time to demonstrate therapeutic effect.  Patient remained agreeable to being aligned with appropriate outpatient mental health resources. Recommend involvement of social work to provide appropriate outpatient psychiatric resources and facilitate referrals once disposition planning is confirmed. Please reach out to psychiatry for any new concerns that may arise. Psychiatry will remain available as needed. Patient appears psychiatrically stable at this time. There are no psychiatric contraindications to disposition planning for the medical team at this time.  12/21/2024: Patient was seen on rounds today by the psychiatry team. Patient continues to deny suicidal or homicidal ideation. He denied auditory or visual hallucinations. On mental status examination, there was no evidence of psychosis or mania, and patient did not appear to be responding to internal stimuli.  Patient reported his mood has improved but acknowledged some ongoing anxiety. Patient endorsed racing thoughts that he attributes to his anxiety. Patient reported sleeping  okay and eating okay at this time. Patient denied any noted side effects from his current medication regimen.  Per report, the primary medical team is adjusting patient's medications at this time and is appropriately managing his psychotropic medication regimen. Patient remains psychiatrically stable for appropriate disposition planning as determined by the medical team. There are no psychiatric contraindications to discharge or disposition planning at this time.  Patient is psychiatrically cleared. Psychiatry will sign off at this time, as the primary medical team is appropriately managing patient's psychiatric medications. Please reconsult psychiatry if any new psychiatric concerns or needs arise during the remainder of patient's hospitalization.    Diagnoses:  Active Hospital problems: Principal Problem:   Acute on chronic respiratory failure with hypoxia and hypercapnia (HCC) Active Problems:   COPD with acute exacerbation (HCC)   Tobacco use disorder   Protein-calorie malnutrition, severe   Depression   Acute on chronic diastolic CHF (congestive heart failure) (HCC)   Headache   Increased anion gap metabolic acidosis   Hypernatremia   Skin pustule   Emphysema lung (HCC)    Plan   ## Psychiatric Medication Recommendations:  Primary medical team adjusting medications   ## Medical Decision Making Capacity: Not specifically addressed in this encounter  ## Further Work-up:   -- most recent EKG on 10/20/2024 had QtC of 506: new EKG ordered today for Qtc monitoring  -- Pertinent labwork reviewed earlier this admission includes: CBC BMP   ## Disposition:-- There are no psychiatric contraindications to discharge at this time  ## Behavioral / Environmental: - No specific recommendations at this time.     ##  Safety and Observation Level:  - Based on my clinical evaluation, I estimate the patient to be at low risk of self harm in the current setting. - At this time, we recommend   routine. This decision is based on my review of the chart including patient's history and current presentation, interview of the patient, mental status examination, and consideration of suicide risk including evaluating suicidal ideation, plan, intent, suicidal or self-harm behaviors, risk factors, and protective factors. This judgment is based on our ability to directly address suicide risk, implement suicide prevention strategies, and develop a safety plan while the patient is in the clinical setting. Please contact our team if there is a concern that risk level has changed.  CSSR Risk Category:C-SSRS RISK CATEGORY: No Risk  Suicide Risk Assessment: Patient has following modifiable risk factors for suicide: lack of access to outpatient mental health resources, which we are addressing by involving social work to help provide appropriate resources. Patient has following non-modifiable or demographic risk factors for suicide: male gender Patient has the following protective factors against suicide: no history of suicide attempts and no history of NSSIB  Thank you for this consult request. Recommendations have been communicated to the primary team.  We will sign off at this time.   Zelda Sharps, NP        History of Present Illness  Relevant Aspects of [facility]   Patient Report:  Psychiatry consulted by medical team. Patient reports doing all right today and states he was having one of them days yesterday but is feeling much better now. He endorses ongoing anxiety and reports previous treatment with Xanax , though his prior provider advised against long-term use due to concerns regarding increased risk of Alzheimer's disease and dementia. Patient states it is difficult to determine if his current Lexapro  regimen is effectively addressing his anxiety and depression. He recalls taking Paxil in the past, which he believes was helpful, though he is uncertain given the length of time since he  last took it. Patient reports the medical team initiated Wellbutrin  today to augment treatment for depression and anxiety as well as assist with smoking cessation, and he is agreeable to this plan. He is willing to wait and assess response to Wellbutrin  before considering addition of another agent such as BuSpar for anxiety management. Patient denies current suicidal or homicidal ideations, auditory or visual hallucinations, and any history of self-harm. He denies access to weapons and reports no current alcohol  or recreational drug use, though he continues to smoke cigarettes daily. This is his second recent hospitalization for respiratory problems. Patient is agreeable to establishing outpatient mental health resources and reports the medical team may be arranging placement in a group home, with preference for outpatient services located near that placement. Patient is alert and oriented to person, place, time, and situation. He presents as calm and cooperative with the psychiatry team. No objective signs of psychosis or mania observed, and patient does not appear to be responding to internal stimuli. He denies any noted side effects at this time, though Wellbutrin  was just recently initiated.  Psych ROS:  Depression: Denied Anxiety: Endorsed Mania (lifetime and current): Denied Psychosis: (lifetime and current): Denied  Collateral information:  No collateral information provided by patient at this time.  Patient reported not having any local family or friends currently   Psychiatric and Social History  Psychiatric History:  Information collected from Patient/chart review  Prev Dx/Sx: Depression, anxiety Current Psych Provider: None Home Meds (current): Lexapro , seroquel , newly  added wellbutrin  Previous Med Trials: Paxil Therapy: Denied  Prior Psych Hospitalization: Yes Prior Self Harm: Reported history of suicidal thoughts, but denied any previous suicide attempts or self-harm Prior  Violence: Denied   Family Psych History: Unknown Family Hx suicide: Unknown   Social History:   Educational Hx: Unknown Occupational Hx: Reported inability to work related to his chronic medical conditions-not currently receiving disability checks Legal Hx: Denied Living Situation: Reported living placed a place wherever I can.  Reported being homeless Spiritual Hx: Unknown Access to weapons/lethal means: Denied   Substance History Alcohol : Denied current use History of alcohol  withdrawal seizures denied History of DT's denied Tobacco: Daily cigarette use Illicit drugs: Denied Prescription drug abuse: Denied Rehab hx: Denied  Exam Findings  Physical Exam: Reviewed and agree with the physical exam findings conducted by the medical provider Vital Signs:  Temp:  [97.5 F (36.4 C)-98.1 F (36.7 C)] 97.5 F (36.4 C) (01/05 0821) Pulse Rate:  [68-71] 68 (01/05 0821) Resp:  [16] 16 (01/05 0821) BP: (101-111)/(71-79) 111/77 (01/05 0821) SpO2:  [94 %-96 %] 96 % (01/05 0821) Weight:  [60.5 kg] 60.5 kg (01/05 0433) Blood pressure 111/77, pulse 68, temperature (!) 97.5 F (36.4 C), resp. rate 16, height 5' 4.02 (1.626 m), weight 60.5 kg, SpO2 96%. Body mass index is 22.88 kg/m.    Mental Status Exam: General Appearance: Casual  Orientation:  Full (Time, Place, and Person)  Memory:  Immediate;   Good Recent;   Good Remote;   Good  Concentration:  Concentration: Good  Recall:  Good  Attention  Good  Eye Contact:  Good  Speech:  Clear and Coherent  Language:  Good  Volume:  Normal  Mood: Anxious  Affect:  Congruent  Thought Process:  Coherent, Goal Directed, and Linear  Thought Content:  Logical  Suicidal Thoughts:  No  Homicidal Thoughts:  No  Judgement:  Fair  Insight:  Fair  Psychomotor Activity:  Normal  Akathisia:  No  Fund of Knowledge:  Fair      Assets:  Communication Skills Desire for Improvement  Cognition:  WNL  ADL's:  Intact  AIMS (if  indicated):        Other History   These have been pulled in through the EMR, reviewed, and updated if appropriate.  Family History:  The patient's family history is not on file.  Medical History: Past Medical History:  Diagnosis Date   Chronic cough    Urolithiasis    Urolithiasis     Surgical History: Past Surgical History:  Procedure Laterality Date   CATARACT EXTRACTION W/PHACO Left 08/09/2021   Procedure: CATARACT EXTRACTION PHACO AND INTRAOCULAR LENS PLACEMENT (IOC) LEFT .051 00:10.0;  Surgeon: Mittie Gaskin, MD;  Location: [facility];  Service: Ophthalmology;  Laterality: Left;   CATARACT EXTRACTION W/PHACO Right 10/04/2021   Procedure: CATARACT EXTRACTION PHACO AND INTRAOCULAR LENS PLACEMENT (IOC) RIGHT 0.67 00:13.8;  Surgeon: Mittie Gaskin, MD;  Location: [facility];  Service: Ophthalmology;  Laterality: Right;  general anesthesia needs to be last case   HERNIA REPAIR     x2   MANDIBLE FRACTURE SURGERY       Medications:  Current Medications[1]  Allergies: Allergies[2] Zelda Sharps, NP This note was created using Dragon dictation software. Please excuse any inadvertent transcription errors. Case was discussed with supervising physician Dr. Jadapalle who is agreeable with current plan.        [1]  Current Facility-Administered Medications:    acetaminophen  (TYLENOL ) tablet 650 mg,  650 mg, Oral, Q6H PRN, Josette Ade, MD, 650 mg at 12/20/24 1730   acetaminophen -caffeine  (EXCEDRIN  TENSION HEADACHE) 500-65 MG per tablet 1 tablet, 1 tablet, Oral, Q8H PRN, Maree Hue, MD, 1 tablet at 12/16/24 1620   albuterol  (PROVENTIL ) (2.5 MG/3ML) 0.083% nebulizer solution 2.5 mg, 2.5 mg, Nebulization, Q2H PRN, Foust, Katy L, NP, 2.5 mg at 10/23/24 9176   ALPRAZolam  (XANAX ) tablet 0.25 mg, 0.25 mg, Oral, TID PRN, Maree Hue, MD, 0.25 mg at 12/21/24 0932   artificial tears ophthalmic solution 1 drop, 1 drop, Both Eyes, PRN, Foust, Katy L, NP    buPROPion  (WELLBUTRIN  XL) 24 hr tablet 150 mg, 150 mg, Oral, Daily, Zhang, Dekui, MD, 150 mg at 12/21/24 0935   enoxaparin  (LOVENOX ) injection 40 mg, 40 mg, Subcutaneous, Q24H, Foust, Katy L, NP, 40 mg at 12/20/24 1813   escitalopram  (LEXAPRO ) tablet 20 mg, 20 mg, Oral, Daily, Amin, Sumayya, MD, 20 mg at 12/21/24 0932   guaiFENesin  (MUCINEX ) 12 hr tablet 600 mg, 600 mg, Oral, BID, Foust, Katy L, NP, 600 mg at 12/21/24 0932   loratadine  (CLARITIN ) tablet 10 mg, 10 mg, Oral, Daily PRN, Pokhrel, Laxman, MD, 10 mg at 12/17/24 0842   melatonin tablet 5 mg, 5 mg, Oral, QHS PRN, Foust, Katy L, NP, 5 mg at 12/19/24 2322   multivitamin with minerals tablet 1 tablet, 1 tablet, Oral, Daily, Wieting, Richard, MD, 1 tablet at 12/21/24 0932   nicotine  (NICODERM CQ  - dosed in mg/24 hours) patch 14 mg, 14 mg, Transdermal, Daily, Foust, Katy L, NP, 14 mg at 12/21/24 0932   nicotine  polacrilex (NICORETTE ) gum 2 mg, 2 mg, Oral, PRN, Foust, Katy L, NP   Oral care mouth rinse, 15 mL, Mouth Rinse, PRN, Wieting, Richard, MD   pantoprazole  (PROTONIX ) EC tablet 40 mg, 40 mg, Oral, Daily, Foust, Katy L, NP, 40 mg at 12/21/24 0932   QUEtiapine  (SEROQUEL ) tablet 50 mg, 50 mg, Oral, QHS, Wieting, Richard, MD, 50 mg at 12/20/24 2225   sodium chloride  (OCEAN) 0.65 % nasal spray 1 spray, 1 spray, Each Nare, PRN, Patel, Sona, MD   umeclidinium-vilanterol (ANORO ELLIPTA ) 62.5-25 MCG/ACT 1 puff, 1 puff, Inhalation, Daily, Amin, Sumayya, MD, 1 puff at 12/21/24 0932 [2] No Known Allergies

## 2024-12-21 NOTE — Progress Notes (Signed)
 " Progress Note   Patient: Corey House. FMW:[id number] DOB: [birth year] DOA: 10/20/2024     62 DOS: the patient was seen and examined on 12/21/2024   Brief hospital course: 58 y.o. year old male with past medical history of  tobacco use disorder reporting a 41 year pack per year history, smoking ~1 pack per day since he was 59 years old. He denies any additional medical history though he has not seen a medical provider in ~20 years. He presents to [facility] ED with shortness of breath that began ~ 9 months ago and worsened ~3 months ago. He reports today his dyspnea is not significantly different than it was 3 months ago. He endorses a chronic cough that has been present since childhood. Her ports the cough is currently, minimally productive with white phlegm. Additionally, he endorses bilateral lower extremity swelling that began in the last 2 days, per his report (R) > (L). On exam the swelling is equal bilaterally. He endorses fatigue and dyspnea that is worse with position changes such as bending over and when standing from lying. He denies chest pain, palpitations, or fever.   ED Course: On arrival to [facility] ED patient was noted to be afebrile temp 36.8C, BP 142/111, HR 88, RR 22, SPO2 100% on 15L non-rebreather. Patient now on 2L via nasal cannula with SPO2 96%. CXR obtained and shows non specific findings of increased interstitial markings. CTA PE obtained and shows no PE, dilated RV and reflux of contract into the IVC and hepatic vein consistent with (R) heart failure, chronic bronchitis, and emphysema. US  DVT (B)LE negative for DVT. Labs notable Negative COVID, flu, RSV,for BNP 284.2, BUN 24, troponin 41--> 38 with no report of chest pain.  Lactic acid 0.9.  Blood cultures drawn and in process.  He was given Lasix , DuoNebs, and Solu-Medrol .    Patient was initially treated in the hospital, condition has improved.  Currently just pending for placement.  Assessment and  Plan: * Acute on chronic respiratory failure with hypoxia and hypercapnia (HCC) Multiple attempts were made to wean the patient from oxygen during the hospital.  Pulse ox drops with ambulation (down to 85% last week).  Patient unable to tolerate BiPAP.  Currently on 2 L.  Nausea Patient thinks this is secondary to the Wellbutrin .  Getting rid of this medication at patient request.  Zofran  as needed.  Depression Increased Seroquel  to 50 mg at night on 12/18/2024.  On Xanax  and Lexapro .  Added Wellbutrin  on 12/22.  Patient asked to come off the Wellbutrin  on 1/5 secondary to feeling nauseous.  Wellbutrin  discontinued.  COPD with acute exacerbation (HCC) Treated during the hospital course  Acute on chronic diastolic CHF (congestive heart failure) (HCC) EF 55 to 60%.  Off Lasix  at this time and stable  Tobacco use disorder On NicoDerm patch  Headache Improved.  Tylenol  as needed.  IV magnesium  given on 11/5.  Increased anion gap metabolic acidosis Resolved  Hypernatremia Resolved  Thrombocytosis Continue to watch intermittently  Protein-calorie malnutrition, severe Continue supplements        Subjective: Patient feeling nauseous today.  Thinks it is secondary to the Wellbutrin  and wants to stop this medication.  Wellbutrin  discontinued.  Patient given nausea medication.  Admitted 62 days ago with COPD exacerbation.  Physical Exam: Vitals:   12/20/24 0500 12/20/24 2023 12/21/24 0433 12/21/24 0821  BP:  109/79 101/71 111/77  Pulse:  71 71 68  Resp:    16  Temp:  98.1 F (36.7 C) 97.6 F (36.4 C) (!) 97.5 F (36.4 C)  TempSrc:  Oral Oral   SpO2:  95% 94% 96%  Weight: 62.3 kg  60.5 kg   Height:       Physical Exam HENT:     Head: Normocephalic.  Cardiovascular:     Rate and Rhythm: Normal rate and regular rhythm.     Heart sounds: Normal heart sounds, S1 normal and S2 normal.  Pulmonary:     Breath sounds: Examination of the right-lower field reveals decreased  breath sounds. Examination of the left-lower field reveals decreased breath sounds. Decreased breath sounds present. No wheezing, rhonchi or rales.  Abdominal:     Palpations: Abdomen is soft.     Tenderness: There is no abdominal tenderness.  Musculoskeletal:     Right lower leg: No swelling.     Left lower leg: No swelling.  Skin:    General: Skin is warm.  Neurological:     Mental Status: He is alert.     Data Reviewed: Creatinine 0.76, electrolytes normal range, white blood cell 5.2, hemoglobin 12.6, platelet count 427  Disposition: Status is: Inpatient Remains inpatient appropriate because: Patient requires 24/7 oxygen and he is homeless.  He will have some sort of placement.  As per TOC they do have an ID.  Planned Discharge Destination: Will need some sort of placement    Time spent: 28 minutes Is discussed with nursing staff and psychiatry at [facility]  Author: Charlie Patterson, MD 12/21/2024 4:13 PM  For on call review [email or web address].  "

## 2024-12-21 NOTE — Plan of Care (Signed)

## 2024-12-22 DIAGNOSIS — F3289 Other specified depressive episodes: Secondary | ICD-10-CM | POA: Diagnosis not present

## 2024-12-22 DIAGNOSIS — J441 Chronic obstructive pulmonary disease with (acute) exacerbation: Secondary | ICD-10-CM | POA: Diagnosis not present

## 2024-12-22 DIAGNOSIS — R110 Nausea: Secondary | ICD-10-CM | POA: Diagnosis not present

## 2024-12-22 DIAGNOSIS — J9621 Acute and chronic respiratory failure with hypoxia: Secondary | ICD-10-CM | POA: Diagnosis not present

## 2024-12-22 MED ORDER — RIVAROXABAN 10 MG PO TABS
10.0000 mg | ORAL_TABLET | Freq: Every day | ORAL | Status: AC
  Administered 2024-12-22 – 2025-01-22 (×32): 10 mg via ORAL
  Filled 2024-12-22 (×33): qty 1

## 2024-12-22 NOTE — Plan of Care (Signed)

## 2024-12-22 NOTE — Progress Notes (Signed)
 Nutrition Brief Note  Wt Readings from Last 15 Encounters:  12/22/24 60.2 kg  06/20/24 51.1 kg  04/22/24 49.9 kg  01/03/23 54.4 kg  10/04/21 51.7 kg  08/09/21 52.2 kg  03/19/21 59 kg  04/16/20 59 kg  03/06/20 59 kg  11/14/19 59 kg  06/29/18 68 kg  06/14/18 68 kg  05/04/18 74.8 kg  03/22/18 72.6 kg  01/30/17 72.6 kg   DOCUMENTATION CODES:    Severe malnutrition in context of social or environmental circumstances   INTERVENTION:    -Continue 2 gram sodium diet -Continue MVI with minerals daily -Continue Magic cup TID with meals, each supplement provides 290 kcal and 9 grams of protein  -RD will sign off secondary to medical stability; if further nutrition-related needs arise, please re-consult RD    NUTRITION DIAGNOSIS:    Severe Malnutrition related to social / environmental circumstances as evidenced by moderate fat depletion, severe fat depletion, moderate muscle depletion, severe muscle depletion, edema.   Ongoing   GOAL:    Patient will meet greater than or equal to 90% of their needs   Progressing    He is medically stable for discharge, but remains in the hospital secondary to lack of safe discharge plan.   Current diet order is 2 gram sodium, patient is consuming approximately 50-85% of meals at this time. Labs and medications reviewed.   No nutrition interventions warranted at this time. If nutrition issues arise, please consult RD.   Margery ORN, RD, LDN, CDCES Registered Dietitian III Certified Diabetes Care and Education Specialist If unable to reach this RD, please use RD Inpatient group chat on secure chat between hours of 8am-4 pm daily

## 2024-12-22 NOTE — TOC Progression Note (Signed)
 Transition of Care (TOC) - Progression Note    Patient Details  Name: Corey House. MRN: [id number] Date of Birth: 05-27-1966  Transition of Care [facility]) CM/SW Contact  Corey CHRISTELLA Ring, RN Phone Number: 12/22/2024, 3:55 PM  Clinical Narrative:    CM emailed Corey House with [facility] for any updates and to see when [facility] can come by and do an assessment.    Expected Discharge Plan:  (patient is currently homeless, unsure of discharge location at this time) Barriers to Discharge: Financial Resources               Expected Discharge Plan and Services   Discharge Planning Services: CM Consult   Living arrangements for the past 2 months: Homeless Expected Discharge Date: 11/03/24                                     Social Drivers of Health (SDOH) Interventions SDOH Screenings   Food Insecurity: Food Insecurity Present (10/20/2024)  Housing: High Risk (10/21/2024)  Transportation Needs: Unmet Transportation Needs (10/20/2024)  Utilities: At Risk (10/20/2024)  Alcohol  Screen: Low Risk (04/23/2024)  Social Connections: Unknown (04/23/2024)  Tobacco Use: High Risk (10/21/2024)    Readmission Risk Interventions     No data to display

## 2024-12-22 NOTE — Progress Notes (Signed)
 " Progress Note   Patient: Corey House. FMW:[id number] DOB: 11/28/1966 DOA: 10/20/2024     59 DOS: the patient was seen and examined on 12/22/2024   Brief hospital course: 59 y.o. year old male with past medical history of  tobacco use disorder reporting a 41 year pack per year history, smoking ~1 pack per day since he was 59 years old. He denies any additional medical history though he has not seen a medical provider in ~20 years. He presents to [facility] ED with shortness of breath that began ~ 9 months ago and worsened ~3 months ago. He reports today his dyspnea is not significantly different than it was 3 months ago. He endorses a chronic cough that has been present since childhood. Her ports the cough is currently, minimally productive with white phlegm. Additionally, he endorses bilateral lower extremity swelling that began in the last 2 days, per his report (R) > (L). On exam the swelling is equal bilaterally. He endorses fatigue and dyspnea that is worse with position changes such as bending over and when standing from lying. He denies chest pain, palpitations, or fever.   ED Course: On arrival to [facility] ED patient was noted to be afebrile temp 36.8C, BP 142/111, HR 88, RR 22, SPO2 100% on 15L non-rebreather. Patient now on 2L via nasal cannula with SPO2 96%. CXR obtained and shows non specific findings of increased interstitial markings. CTA PE obtained and shows no PE, dilated RV and reflux of contract into the IVC and hepatic vein consistent with (R) heart failure, chronic bronchitis, and emphysema. US  DVT (B)LE negative for DVT. Labs notable Negative COVID, flu, RSV,for BNP 284.2, BUN 24, troponin 41--> 38 with no report of chest pain.  Lactic acid 0.9.  Blood cultures drawn and in process.  He was given Lasix , DuoNebs, and Solu-Medrol .    Patient was initially treated in the hospital, condition has improved.  Currently just pending for placement.  Assessment and  Plan: * Acute on chronic respiratory failure with hypoxia and hypercapnia (HCC) Multiple attempts were made to wean the patient from oxygen during the hospital.  Pulse ox drops with ambulation on room air (down to 85% last week).  Patient unable to tolerate BiPAP.  Currently on 2 L.  Nausea Improved.  Patient thinks it secondary to the Wellbutrin .  This medication was discontinued on 1/5.  Depression Increased Seroquel  to 50 mg at night on 12/18/2024.  On Xanax  and Lexapro .  Added Wellbutrin  on 12/22.  Patient asked to come off the Wellbutrin  on 1/5 secondary to feeling nauseous.  Wellbutrin  discontinued.  COPD with acute exacerbation (HCC) Treated during the hospital course  Acute on chronic diastolic CHF (congestive heart failure) (HCC) EF 55 to 60%.  Off Lasix  at this time and stable  Tobacco use disorder On NicoDerm patch  Increased anion gap metabolic acidosis Resolved  Hypernatremia Resolved  Thrombocytosis Continue to watch intermittently  Protein-calorie malnutrition, severe Continue supplements        Subjective: Patient feeling fine today.  Encouraged to walk around.  Patient with nausea yesterday but that has resolved.  He felt it was secondary to the Wellbutrin .  Wellbutrin  discontinued.  Admitted 63 days ago with COPD exacerbation.  Physical Exam: Vitals:   12/21/24 1629 12/21/24 2100 12/22/24 0500 12/22/24 0746  BP: 122/81 105/71  101/74  Pulse: 74 72  70  Resp: 15   16  Temp: 98.6 F (37 C)   98.7 F (37.1 C)  TempSrc: Oral   Oral  SpO2: 95% 96%  97%  Weight:   60.2 kg   Height:       Physical Exam HENT:     Head: Normocephalic.  Cardiovascular:     Rate and Rhythm: Normal rate and regular rhythm.     Heart sounds: Normal heart sounds, S1 normal and S2 normal.  Pulmonary:     Breath sounds: Examination of the right-lower field reveals decreased breath sounds. Examination of the left-lower field reveals decreased breath sounds. Decreased breath  sounds present. No wheezing, rhonchi or rales.  Abdominal:     Palpations: Abdomen is soft.     Tenderness: There is no abdominal tenderness.  Musculoskeletal:     Right lower leg: No swelling.     Left lower leg: No swelling.  Skin:    General: Skin is warm.  Neurological:     Mental Status: He is alert.     Data Reviewed: Creatinine 0.76, hemoglobin 12.6, platelet count 427  Disposition: Status is: Inpatient Remains inpatient appropriate because: TOC will have to figure out some sort of placement.  They did obtain an identification card for him.  Planned Discharge Destination: Will need some sort of placement    Time spent: 26 minutes Spoke with nursing staff  Author: Charlie Patterson, MD 12/22/2024 11:16 AM  For on call review [email or web address].  "

## 2024-12-23 ENCOUNTER — Ambulatory Visit: Payer: MEDICAID

## 2024-12-23 DIAGNOSIS — Z1211 Encounter for screening for malignant neoplasm of colon: Secondary | ICD-10-CM

## 2024-12-23 DIAGNOSIS — J9621 Acute and chronic respiratory failure with hypoxia: Secondary | ICD-10-CM | POA: Diagnosis not present

## 2024-12-23 DIAGNOSIS — J9622 Acute and chronic respiratory failure with hypercapnia: Secondary | ICD-10-CM | POA: Diagnosis not present

## 2024-12-23 DIAGNOSIS — Z23 Encounter for immunization: Secondary | ICD-10-CM

## 2024-12-23 NOTE — TOC Progression Note (Signed)
 Transition of Care (TOC) - Progression Note    Patient Details  Name: Corey House. MRN: [id number] Date of Birth: 02-21-1966  Transition of Care [facility]) CM/SW Contact  [name], RN Phone Number: 12/23/2024, 12:12 PM  Clinical Narrative:    Received a message back from Zelvia with [facility] that she has not heard from [facility].  They are continuing to search for placement.    Expected Discharge Plan:  (patient is currently homeless, unsure of discharge location at this time) Barriers to Discharge: Financial Resources               Expected Discharge Plan and Services   Discharge Planning Services: CM Consult   Living arrangements for the past 2 months: Homeless Expected Discharge Date: 11/03/24                                     Social Drivers of Health (SDOH) Interventions SDOH Screenings   Food Insecurity: Food Insecurity Present (10/20/2024)  Housing: High Risk (10/21/2024)  Transportation Needs: Unmet Transportation Needs (10/20/2024)  Utilities: At Risk (10/20/2024)  Alcohol  Screen: Low Risk (04/23/2024)  Social Connections: Unknown (04/23/2024)  Tobacco Use: High Risk (10/21/2024)    Readmission Risk Interventions     No data to display

## 2024-12-23 NOTE — Plan of Care (Signed)

## 2024-12-23 NOTE — Progress Notes (Signed)
 " PROGRESS NOTE    Corey House.   FMW:[id number] DOB: 1966-05-19  DOA: 10/20/2024 Date of Service: 12/23/2024 which is hospital day 69  PCP: Patient, [name]    Hospital course / significant events:   59 y.o. year old male with past medical history of  tobacco use disorder reporting a 41 year pack per year history, smoking ~1 pack per day since he was 59 years old. He denies any additional medical history though he has not seen a medical provider in ~20 years. He presents to [facility] ED with shortness of breath that began ~ 9 months ago and worsened ~3 months ago. He reports today his dyspnea is not significantly different than it was 3 months ago. He endorses a chronic cough that has been present since childhood. Her ports the cough is currently, minimally productive with white phlegm. Additionally, he endorses bilateral lower extremity swelling that began in the last 2 days, per his report (R) > (L). On exam the swelling is equal bilaterally. He endorses fatigue and dyspnea that is worse with position changes such as bending over and when standing from lying. He denies chest pain, palpitations, or fever.   ED Course: On arrival to [facility] ED patient was noted to be afebrile temp 36.8C, BP 142/111, HR 88, RR 22, SPO2 100% on 15L non-rebreather. Patient now on 2L via nasal cannula with SPO2 96%. CXR obtained and shows non specific findings of increased interstitial markings. CTA PE obtained and shows no PE, dilated RV and reflux of contract into the IVC and hepatic vein consistent with (R) heart failure, chronic bronchitis, and emphysema. US  DVT (B)LE negative for DVT. Labs notable Negative COVID, flu, RSV,for BNP 284.2, BUN 24, troponin 41--> 38 with no report of chest pain.  Lactic acid 0.9.  Blood cultures drawn and in process.  He was given Lasix , DuoNebs, and Solu-Medrol .    Patient was initially treated in the hospital, condition has improved.  Currently just pending  for placement.    Consultants:  Psychiatry Pulmonology   Procedures/Surgeries: none      ASSESSMENT & PLAN:   Acute on chronic respiratory failure with hypoxia and hypercapnia  Multiple attempts were made to wean the patient from oxygen during the hospital.  Pulse ox drops with ambulation on room air (down to 85% last week).  Patient unable to tolerate BiPAP.  Currently on 2 L.   Nausea Improved.  Patient thinks it secondary to the Wellbutrin .  This medication was discontinued on 1/5.   Depression Increased Seroquel  to 50 mg at night on 12/18/2024.  On Xanax  and Lexapro .  Added Wellbutrin  on 12/22.  Patient asked to come off the Wellbutrin  on 1/5 secondary to feeling nauseous.  Wellbutrin  discontinued.   COPD with acute exacerbation  Treated during the hospital course   Acute on chronic diastolic CHF (congestive heart failure)  EF 55 to 60%.  Off Lasix  at this time and stable   Tobacco use disorder On NicoDerm patch   Increased anion gap metabolic acidosis Resolved   Hypernatremia Resolved   Thrombocytosis Continue to watch intermittently   Protein-calorie malnutrition, severe Continue supplements    No concerns based on BMI: Body mass index is 23.19 kg/m.SABRA Significantly low or high BMI is associated with higher medical risk.  Underweight - under 18  overweight - 25 to 29 obese - 30 or more Class 1 obesity: BMI of 30.0 to 34 Class 2 obesity: BMI of 35.0 to 39  Class 3 obesity: BMI of 40.0 to 49 Super Morbid Obesity: BMI 50-59 Super-super Morbid Obesity: BMI 60+ Healthy nutrition and physical activity advised as adjunct to other disease management and risk reduction treatments    DVT prophylaxis: xarelto  IV fluids: no continuous IV fluids  Nutrition: low salt Central lines / other devices: none  Code Status: FULL CODE ACP documentation reviewed:  none on file in [facility]  TOC needs: placement Medical barriers to dispo: none.              Subjective / Brief ROS:  Patient reports feeling fine today no concerns Denies CP/SOB.  Pain controlled.  Denies new weakness.  Tolerating diet.  Reports no concerns w/ urination/defecation.   Family Communication: none    Objective Findings:  Vitals:   12/23/24 0358 12/23/24 0417 12/23/24 0619 12/23/24 0834  BP:  96/71  108/71  Pulse:  72  68  Resp:  18  16  Temp:  100 F (37.8 C) 97.9 F (36.6 C) 98 F (36.7 C)  TempSrc:  Oral Oral Oral  SpO2:  94%  96%  Weight: 61.3 kg     Height:        Intake/Output Summary (Last 24 hours) at 12/23/2024 1546 Last data filed at 12/23/2024 1453 Gross per 24 hour  Intake 720 ml  Output --  Net 720 ml   Filed Weights   12/21/24 0433 12/22/24 0500 12/23/24 0358  Weight: 60.5 kg 60.2 kg 61.3 kg    Examination:  Physical Exam Constitutional:      General: He is not in acute distress. Cardiovascular:     Rate and Rhythm: Normal rate and regular rhythm.  Pulmonary:     Effort: Pulmonary effort is normal.     Breath sounds: Normal breath sounds.  Musculoskeletal:     Right lower leg: No edema.     Left lower leg: No edema.  Skin:    General: Skin is warm and dry.  Neurological:     Mental Status: He is alert and oriented to person, place, and time.          Scheduled Medications:   escitalopram   20 mg Oral Daily   guaiFENesin   600 mg Oral BID   multivitamin with minerals  1 tablet Oral Daily   nicotine   14 mg Transdermal Daily   pantoprazole   40 mg Oral Daily   QUEtiapine   50 mg Oral QHS   rivaroxaban   10 mg Oral Q supper   umeclidinium-vilanterol  1 puff Inhalation Daily    Continuous Infusions:   PRN Medications:  acetaminophen , acetaminophen -caffeine , albuterol , ALPRAZolam , artificial tears, loratadine , melatonin, nicotine  polacrilex, ondansetron , mouth rinse, sodium chloride   Antimicrobials from admission:  Anti-infectives (From admission, onward)    Start     Dose/Rate Route  Frequency Ordered Stop   12/03/24 1300  doxycycline  (VIBRA -TABS) tablet 100 mg        100 mg Oral Every 12 hours 12/03/24 1212 12/09/24 2108   10/21/24 1445  azithromycin  (ZITHROMAX ) tablet 250 mg  Status:  Discontinued        250 mg Oral Daily 10/21/24 1351 11/11/24 1809           Data Reviewed:  I have personally reviewed the following...  CBC: Recent Labs  Lab 12/21/24 0622  WBC 5.2  HGB 12.6*  HCT 38.5*  MCV 90.6  PLT 427*   Basic Metabolic Panel: Recent Labs  Lab 12/21/24 0622  NA 141  K 4.0  CL 103  CO2 29  GLUCOSE 103*  BUN 15  CREATININE 0.76  CALCIUM 9.5   GFR: Estimated Creatinine Clearance: 85.3 mL/min (by C-G formula based on SCr of 0.76 mg/dL). Liver Function Tests: No results for input(s): AST, ALT, ALKPHOS, BILITOT, PROT, ALBUMIN in the last 168 hours. No results for input(s): LIPASE, AMYLASE in the last 168 hours. No results for input(s): AMMONIA in the last 168 hours. Coagulation Profile: No results for input(s): INR, PROTIME in the last 168 hours. Cardiac Enzymes: No results for input(s): CKTOTAL, CKMB, CKMBINDEX, TROPONINI in the last 168 hours. BNP (last 3 results) Recent Labs    10/29/24 0608  PROBNP <50.0   HbA1C: No results for input(s): HGBA1C in the last 72 hours. CBG: No results for input(s): GLUCAP in the last 168 hours. Lipid Profile: No results for input(s): CHOL, HDL, LDLCALC, TRIG, CHOLHDL, LDLDIRECT in the last 72 hours. Thyroid Function Tests: No results for input(s): TSH, T4TOTAL, FREET4, T3FREE, THYROIDAB in the last 72 hours. Anemia Panel: No results for input(s): VITAMINB12, FOLATE, FERRITIN, TIBC, IRON, RETICCTPCT in the last 72 hours. Most Recent Urinalysis On File:     Component Value Date/Time   COLORURINE YELLOW (A) 01/03/2023 2105   APPEARANCEUR CLEAR (A) 01/03/2023 2105   LABSPEC 1.025 01/03/2023 2105   PHURINE 5.0 01/03/2023 2105    GLUCOSEU NEGATIVE 01/03/2023 2105   HGBUR NEGATIVE 01/03/2023 2105   BILIRUBINUR NEGATIVE 01/03/2023 2105   KETONESUR 20 (A) 01/03/2023 2105   PROTEINUR 30 (A) 01/03/2023 2105   NITRITE NEGATIVE 01/03/2023 2105   LEUKOCYTESUR NEGATIVE 01/03/2023 2105   Sepsis Labs: @LABRCNTIP (procalcitonin:4,lacticidven:4) Microbiology: No results found for this or any previous visit (from the past 240 hours).    Radiology Studies last 3 days: No results found.       Corey Mello, DO [facility] 12/23/2024, 3:46 PM    Dictation software may have been used to generate the above note. Typos may occur and escape review in typed/dictated notes. Please contact Dr Marsa directly for clarity if needed.  Staff may message me via secure chat in Epic  but this may not receive an immediate response,  please page me for urgent matters!  If 7PM-7AM, please contact night coverage [email or web address]       "

## 2024-12-24 ENCOUNTER — Inpatient Hospital Stay: Payer: MEDICAID

## 2024-12-24 DIAGNOSIS — J9621 Acute and chronic respiratory failure with hypoxia: Secondary | ICD-10-CM | POA: Diagnosis not present

## 2024-12-24 DIAGNOSIS — J9622 Acute and chronic respiratory failure with hypercapnia: Secondary | ICD-10-CM | POA: Diagnosis not present

## 2024-12-24 MED ORDER — TUBERCULIN PPD 5 UNIT/0.1ML ID SOLN
5.0000 [IU] | Freq: Once | INTRADERMAL | Status: AC
  Administered 2024-12-24: 5 [IU] via INTRADERMAL
  Filled 2024-12-24: qty 0.1

## 2024-12-24 NOTE — TOC Progression Note (Signed)
 Transition of Care (TOC) - Progression Note    Patient Details  Name: Corey House. MRN: [id number] Date of Birth: Feb 02, 1966  Transition of Care [facility]) CM/SW Contact  [name], RN Phone Number: 12/24/2024, 11:06 AM  Clinical Narrative:    CM received a call from Zelvia with [employer], She reports that [facility] came to see the patient yesterday afternoon and they would like to accept him, they need an updated FL2, a TB skin test and Care Plan.  Asked that she clarify what they mean by Care Plan.  Patient needs oxygen set up, reached out to [place] with [facility], they are in network with [facility] he will head to the hospital so he can get their order form signed for a Portable oxygen concentrator.  The facility cannot have oxygen tanks.     Expected Discharge Plan:  (patient is currently homeless, unsure of discharge location at this time) Barriers to Discharge: Financial Resources               Expected Discharge Plan and Services   Discharge Planning Services: CM Consult   Living arrangements for the past 2 months: Homeless Expected Discharge Date: 11/03/24                                     Social Drivers of Health (SDOH) Interventions SDOH Screenings   Food Insecurity: Food Insecurity Present (10/20/2024)  Housing: High Risk (10/21/2024)  Transportation Needs: Unmet Transportation Needs (10/20/2024)  Utilities: At Risk (10/20/2024)  Alcohol  Screen: Low Risk (04/23/2024)  Social Connections: Unknown (04/23/2024)  Tobacco Use: High Risk (10/21/2024)    Readmission Risk Interventions     No data to display

## 2024-12-24 NOTE — Progress Notes (Addendum)
 " PROGRESS NOTE    Corey House.   FMW:[id number] DOB: [birth year]  DOA: 10/20/2024 Date of Service: 12/24/2024 which is hospital day 65  PCP: Patient, [name]    Hospital course / significant events:   59 y.o. year old male with past medical history of  tobacco use disorder reporting a 41 year pack per year history, smoking ~1 pack per day since he was 59 years old. He denies any additional medical history though he has not seen a medical provider in ~20 years. He presents to [facility] ED with shortness of breath that began ~ 9 months ago and worsened ~3 months ago. He reports today his dyspnea is not significantly different than it was 3 months ago. He endorses a chronic cough that has been present since childhood. Her ports the cough is currently, minimally productive with white phlegm. Additionally, he endorses bilateral lower extremity swelling that began in the last 2 days, per his report (R) > (L). On exam the swelling is equal bilaterally. He endorses fatigue and dyspnea that is worse with position changes such as bending over and when standing from lying. He denies chest pain, palpitations, or fever.   ED Course: On arrival to [facility] ED patient was noted to be afebrile temp 36.8C, BP 142/111, HR 88, RR 22, SPO2 100% on 15L non-rebreather. Patient now on 2L via nasal cannula with SPO2 96%. CXR obtained and shows non specific findings of increased interstitial markings. CTA PE obtained and shows no PE, dilated RV and reflux of contract into the IVC and hepatic vein consistent with (R) heart failure, chronic bronchitis, and emphysema. US  DVT (B)LE negative for DVT. Labs notable Negative COVID, flu, RSV,for BNP 284.2, BUN 24, troponin 41--> 38 with no report of chest pain.  Lactic acid 0.9.  Blood cultures drawn and in process.  He was given Lasix , DuoNebs, and Solu-Medrol .    Patient was initially treated in the hospital, condition has improved.  Currently just pending  for placement.    Consultants:  Psychiatry Pulmonology   Procedures/Surgeries: none      ASSESSMENT & PLAN:   Acute on chronic respiratory failure with hypoxia and hypercapnia  Multiple attempts were made to wean the patient from oxygen during the hospital.  Pulse ox drops with ambulation on room air (down to 85% last week).  Patient unable to tolerate BiPAP.  Currently on 2 L. [name] requires volume targeted ventilation via NIV/HMV for nocturnal and daytime use for CRF secondary to COPD. Alarms and internal battery are required to ensure continuous support and immediate recognition of ventilatory insufficiency. A RAD with backup rate is insufficient due to the severity of illness. Volume ventilation is needed to reduce work of breathing and fatigue and to prevent life threatening exacerbations. OSA is not the predominant cause of hypercapnia. PCO2 of 62 was confirmed by ABG.    Nausea Improved.  Patient thinks it secondary to the Wellbutrin .  This medication was discontinued on 1/5.   Depression Increased Seroquel  to 50 mg at night on 12/18/2024.  On Xanax  and Lexapro .  Added Wellbutrin  on 12/22.  Patient asked to come off the Wellbutrin  on 1/5 secondary to feeling nauseous.  Wellbutrin  discontinued.   COPD with acute exacerbation  Treated during the hospital course   Acute on chronic diastolic CHF (congestive heart failure)  EF 55 to 60%.  Off Lasix  at this time and stable   Tobacco use disorder On NicoDerm patch   Increased  anion gap metabolic acidosis Resolved   Hypernatremia Resolved   Thrombocytosis Continue to watch intermittently   Protein-calorie malnutrition, severe Continue supplements  PPD test Placed 12/24/2024  Needs read in 48h      No concerns based on BMI: Body mass index is 23.19 kg/m.SABRA Significantly low or high BMI is associated with higher medical risk.  Underweight - under 18  overweight - 25 to 29 obese - 30 or more Class 1 obesity: BMI  of 30.0 to 34 Class 2 obesity: BMI of 35.0 to 39 Class 3 obesity: BMI of 40.0 to 49 Super Morbid Obesity: BMI 50-59 Super-super Morbid Obesity: BMI 60+ Healthy nutrition and physical activity advised as adjunct to other disease management and risk reduction treatments    DVT prophylaxis: xarelto  IV fluids: no continuous IV fluids  Nutrition: low salt Central lines / other devices: none  Code Status: FULL CODE ACP documentation reviewed:  none on file in [facility]  TOC needs: placement Medical barriers to dispo: none.             Subjective / Brief ROS:  Patient reports feeling fine today no concerns Denies CP/SOB.  Pain controlled.  Denies new weakness.  Tolerating diet.  Reports no concerns w/ urination/defecation.   Family Communication: none    Objective Findings:  Vitals:   12/23/24 1552 12/24/24 0409 12/24/24 0456 12/24/24 0752  BP: 113/74 102/70  104/74  Pulse: 73 79  71  Resp: 19 18  20   Temp: 97.7 F (36.5 C) (!) 97.5 F (36.4 C)  97.9 F (36.6 C)  TempSrc: Oral     SpO2: 94% 93%  95%  Weight:   61.3 kg   Height:        Intake/Output Summary (Last 24 hours) at 12/24/2024 1634 Last data filed at 12/24/2024 0900 Gross per 24 hour  Intake 480 ml  Output --  Net 480 ml   Filed Weights   12/22/24 0500 12/23/24 0358 12/24/24 0456  Weight: 60.2 kg 61.3 kg 61.3 kg    Examination:  Physical Exam Constitutional:      General: He is not in acute distress. Cardiovascular:     Rate and Rhythm: Normal rate and regular rhythm.  Pulmonary:     Effort: Pulmonary effort is normal.     Breath sounds: Normal breath sounds.  Musculoskeletal:     Right lower leg: No edema.     Left lower leg: No edema.  Skin:    General: Skin is warm and dry.  Neurological:     Mental Status: He is alert and oriented to person, place, and time.          Scheduled Medications:   escitalopram   20 mg Oral Daily   guaiFENesin   600 mg Oral BID   multivitamin with  minerals  1 tablet Oral Daily   nicotine   14 mg Transdermal Daily   pantoprazole   40 mg Oral Daily   QUEtiapine   50 mg Oral QHS   rivaroxaban   10 mg Oral Q supper   tuberculin  5 Units Intradermal Once   umeclidinium-vilanterol  1 puff Inhalation Daily    Continuous Infusions:   PRN Medications:  acetaminophen , acetaminophen -caffeine , albuterol , ALPRAZolam , artificial tears, loratadine , melatonin, nicotine  polacrilex, ondansetron , mouth rinse, sodium chloride   Antimicrobials from admission:  Anti-infectives (From admission, onward)    Start     Dose/Rate Route Frequency Ordered Stop   12/03/24 1300  doxycycline  (VIBRA -TABS) tablet 100 mg  100 mg Oral Every 12 hours 12/03/24 1212 12/09/24 2108   10/21/24 1445  azithromycin  (ZITHROMAX ) tablet 250 mg  Status:  Discontinued        250 mg Oral Daily 10/21/24 1351 11/11/24 1809           Data Reviewed:  I have personally reviewed the following...  CBC: Recent Labs  Lab 12/21/24 0622  WBC 5.2  HGB 12.6*  HCT 38.5*  MCV 90.6  PLT 427*   Basic Metabolic Panel: Recent Labs  Lab 12/21/24 0622  NA 141  K 4.0  CL 103  CO2 29  GLUCOSE 103*  BUN 15  CREATININE 0.76  CALCIUM 9.5   GFR: Estimated Creatinine Clearance: 85.3 mL/min (by C-G formula based on SCr of 0.76 mg/dL). Liver Function Tests: No results for input(s): AST, ALT, ALKPHOS, BILITOT, PROT, ALBUMIN in the last 168 hours. No results for input(s): LIPASE, AMYLASE in the last 168 hours. No results for input(s): AMMONIA in the last 168 hours. Coagulation Profile: No results for input(s): INR, PROTIME in the last 168 hours. Cardiac Enzymes: No results for input(s): CKTOTAL, CKMB, CKMBINDEX, TROPONINI in the last 168 hours. BNP (last 3 results) Recent Labs    10/29/24 0608  PROBNP <50.0   HbA1C: No results for input(s): HGBA1C in the last 72 hours. CBG: No results for input(s): GLUCAP in the last 168  hours. Lipid Profile: No results for input(s): CHOL, HDL, LDLCALC, TRIG, CHOLHDL, LDLDIRECT in the last 72 hours. Thyroid Function Tests: No results for input(s): TSH, T4TOTAL, FREET4, T3FREE, THYROIDAB in the last 72 hours. Anemia Panel: No results for input(s): VITAMINB12, FOLATE, FERRITIN, TIBC, IRON, RETICCTPCT in the last 72 hours. Most Recent Urinalysis On File:     Component Value Date/Time   COLORURINE YELLOW (A) 01/03/2023 2105   APPEARANCEUR CLEAR (A) 01/03/2023 2105   LABSPEC 1.025 01/03/2023 2105   PHURINE 5.0 01/03/2023 2105   GLUCOSEU NEGATIVE 01/03/2023 2105   HGBUR NEGATIVE 01/03/2023 2105   BILIRUBINUR NEGATIVE 01/03/2023 2105   KETONESUR 20 (A) 01/03/2023 2105   PROTEINUR 30 (A) 01/03/2023 2105   NITRITE NEGATIVE 01/03/2023 2105   LEUKOCYTESUR NEGATIVE 01/03/2023 2105   Sepsis Labs: @LABRCNTIP (procalcitonin:4,lacticidven:4) Microbiology: No results found for this or any previous visit (from the past 240 hours).    Radiology Studies last 3 days: No results found.       Loucille Takach, DO [facility] 12/24/2024, 4:34 PM    Dictation software may have been used to generate the above note. Typos may occur and escape review in typed/dictated notes. Please contact Dr Marsa directly for clarity if needed.  Staff may message me via secure chat in Epic  but this may not receive an immediate response,  please page me for urgent matters!  If 7PM-7AM, please contact night coverage [email or web address]       "

## 2024-12-24 NOTE — Plan of Care (Signed)

## 2024-12-25 ENCOUNTER — Inpatient Hospital Stay: Payer: MEDICAID

## 2024-12-25 DIAGNOSIS — J9622 Acute and chronic respiratory failure with hypercapnia: Secondary | ICD-10-CM | POA: Diagnosis not present

## 2024-12-25 DIAGNOSIS — J9621 Acute and chronic respiratory failure with hypoxia: Secondary | ICD-10-CM | POA: Diagnosis not present

## 2024-12-25 MED ORDER — ACETAMINOPHEN 325 MG PO TABS: 325.0000 mg | ORAL_TABLET | Freq: Four times a day (QID) | ORAL | 1 refills | Status: AC | PRN

## 2024-12-25 MED ORDER — MELATONIN 5 MG PO TABS: 5.0000 mg | ORAL_TABLET | Freq: Every evening | ORAL | 1 refills | Status: AC | PRN

## 2024-12-25 MED ORDER — PANTOPRAZOLE SODIUM 40 MG PO TBEC: 40.0000 mg | DELAYED_RELEASE_TABLET | Freq: Every day | ORAL | 1 refills | Status: AC

## 2024-12-25 MED ORDER — ACETAMINOPHEN-CAFFEINE 500-65 MG PO TABS: 1.0000 | ORAL_TABLET | Freq: Three times a day (TID) | ORAL | 1 refills | Status: AC | PRN

## 2024-12-25 MED ORDER — POLYVINYL ALCOHOL 1.4 % OP SOLN: 1.0000 [drp] | OPHTHALMIC | 1 refills | Status: AC | PRN

## 2024-12-25 MED ORDER — GUAIFENESIN ER 600 MG PO TB12: 600.0000 mg | ORAL_TABLET | Freq: Two times a day (BID) | ORAL | 1 refills | Status: AC | PRN

## 2024-12-25 MED ORDER — ESCITALOPRAM OXALATE 20 MG PO TABS: 20.0000 mg | ORAL_TABLET | Freq: Every day | ORAL | 1 refills | Status: AC

## 2024-12-25 MED ORDER — ONDANSETRON HCL 4 MG PO TABS: 4.0000 mg | ORAL_TABLET | Freq: Three times a day (TID) | ORAL | 1 refills | Status: AC | PRN

## 2024-12-25 MED ORDER — ALPRAZOLAM 0.25 MG PO TABS: 0.2500 mg | ORAL_TABLET | Freq: Two times a day (BID) | ORAL | 1 refills | Status: AC | PRN

## 2024-12-25 MED ORDER — QUETIAPINE FUMARATE 50 MG PO TABS: 50.0000 mg | ORAL_TABLET | Freq: Every day | ORAL | 1 refills | Status: AC

## 2024-12-25 MED ORDER — ADULT MULTIVITAMIN W/MINERALS CH: 1.0000 | ORAL_TABLET | Freq: Every day | ORAL | 1 refills | Status: AC

## 2024-12-25 MED ORDER — RIVAROXABAN 10 MG PO TABS: 10.0000 mg | ORAL_TABLET | Freq: Every day | ORAL | 1 refills | Status: AC

## 2024-12-25 MED ORDER — FUROSEMIDE 40 MG PO TABS: 40.0000 mg | ORAL_TABLET | Freq: Every day | ORAL | 11 refills | Status: AC | PRN

## 2024-12-25 MED ORDER — NICOTINE 14 MG/24HR TD PT24: 14.0000 mg | MEDICATED_PATCH | Freq: Every day | TRANSDERMAL | 1 refills | Status: AC

## 2024-12-25 MED ORDER — UMECLIDINIUM-VILANTEROL 62.5-25 MCG/ACT IN AEPB: 1.0000 | INHALATION_SPRAY | Freq: Every day | RESPIRATORY_TRACT | 1 refills | Status: AC

## 2024-12-25 MED ORDER — NICOTINE POLACRILEX 2 MG MT GUM: 2.0000 mg | CHEWING_GUM | OROMUCOSAL | 1 refills | Status: AC | PRN

## 2024-12-25 MED ORDER — ALBUTEROL SULFATE HFA 108 (90 BASE) MCG/ACT IN AERS: 1.0000 | INHALATION_SPRAY | RESPIRATORY_TRACT | 1 refills | Status: AC | PRN

## 2024-12-25 MED ORDER — LORATADINE 10 MG PO TABS: 10.0000 mg | ORAL_TABLET | Freq: Every day | ORAL | 1 refills | Status: AC | PRN

## 2024-12-25 MED ORDER — ALPRAZOLAM 0.25 MG PO TABS: 0.2500 mg | ORAL_TABLET | Freq: Two times a day (BID) | ORAL | 1 refills | Status: DC | PRN

## 2024-12-25 NOTE — NC FL2 (Signed)
 " [place]  MEDICAID FL2 LEVEL OF CARE FORM     IDENTIFICATION  Patient Name: Corey House. Birthdate: 1966-07-25 Sex: male Admission Date (Current Location): 10/20/2024  [place] and [place] Number:  Chiropodist and Address:  [facility], [address], [place], [place] [address]      Provider Number: [id number]  Attending Physician Name and Address:  Marsa Edelman, DO  Relative Name and Phone Number:  NA    Current Level of Care: Hospital Recommended Level of Care: Other (Comment) (Adult Care Home) Prior Approval Number:    Date Approved/Denied:   PASRR Number: NA  Discharge Plan: Other (Comment) (Adult Care Home)    Current Diagnoses: Patient Active Problem List   Diagnosis Date Noted   Nausea 12/21/2024   Thrombocytosis 12/21/2024   Emphysema lung (HCC) 12/05/2024   Skin pustule 12/04/2024   Acute on chronic respiratory failure with hypoxia and hypercapnia (HCC) 12/02/2024   Acute on chronic diastolic CHF (congestive heart failure) (HCC) 10/21/2024   Headache 10/21/2024   Increased anion gap metabolic acidosis 10/21/2024   Hypernatremia 10/21/2024   Anxiety state 04/23/2024   MDD (major depressive disorder), recurrent episode, severe (HCC) 04/23/2024   Buprenorphine  and naloxone  withdrawal (HCC) 12/31/2023   Depression 12/28/2023   Benzodiazepine abuse (HCC) 12/28/2023   Suicidal ideations 12/28/2023   Major depressive disorder, recurrent episode, moderate (HCC) 01/04/2023   Pressure injury of skin 01/04/2023   Protein-calorie malnutrition, severe 01/04/2023   COPD with acute exacerbation (HCC) 01/03/2023   Tobacco use disorder 01/03/2023   Acute respiratory failure with hypoxia (HCC) 01/03/2023   Cough 01/03/2023    Orientation RESPIRATION BLADDER Height & Weight     Self, Time, Situation, Place   ([place] 2L) Continent Weight: 63.2 kg Height:  5' 4.02 (162.6 cm)  BEHAVIORAL SYMPTOMS/MOOD NEUROLOGICAL BOWEL  NUTRITION STATUS      Continent Diet (Regular, no added Salt)  AMBULATORY STATUS COMMUNICATION OF NEEDS Skin   Independent Verbally Normal                       Personal Care Assistance Level of Assistance  Bathing, Feeding, Dressing Bathing Assistance: Independent Feeding assistance: Independent Dressing Assistance: Independent     Functional Limitations Info  Sight, Hearing, Speech Sight Info: Adequate Hearing Info: Adequate Speech Info: Adequate    SPECIAL CARE FACTORS FREQUENCY                       Contractures Contractures Info: Not present    Additional Factors Info  Code Status, Allergies Code Status Info: Full Allergies Info: NKA           Current Medications (12/25/2024):  This is the current hospital active medication list Current Facility-Administered Medications  Medication Dose Route Frequency Provider Last Rate Last Admin   acetaminophen  (TYLENOL ) tablet 650 mg  650 mg Oral Q6H PRN Josette Ade, MD   650 mg at 12/25/24 0848   acetaminophen -caffeine  (EXCEDRIN  TENSION HEADACHE) 500-65 MG per tablet 1 tablet  1 tablet Oral Q8H PRN Maree Hue, MD   1 tablet at 12/16/24 1620   albuterol  (PROVENTIL ) (2.5 MG/3ML) 0.083% nebulizer solution 2.5 mg  2.5 mg Nebulization Q2H PRN Foust, Katy L, NP   2.5 mg at 10/23/24 9176   ALPRAZolam  (XANAX ) tablet 0.25 mg  0.25 mg Oral TID PRN Shah, Vipul, MD   0.25 mg at 12/25/24 0848   artificial tears ophthalmic solution 1 drop  1 drop Both Eyes PRN Foust, Katy L, NP       escitalopram  (LEXAPRO ) tablet 20 mg  20 mg Oral Daily Amin, Sumayya, MD   20 mg at 12/25/24 0848   guaiFENesin  (MUCINEX ) 12 hr tablet 600 mg  600 mg Oral BID Foust, Katy L, NP   600 mg at 12/25/24 0848   loratadine  (CLARITIN ) tablet 10 mg  10 mg Oral Daily PRN Pokhrel, Laxman, MD   10 mg at 12/17/24 0842   melatonin tablet 5 mg  5 mg Oral QHS PRN Jurline Pee L, NP   5 mg at 12/21/24 2134   multivitamin with minerals tablet 1 tablet  1 tablet Oral  Daily Josette Ade, MD   1 tablet at 12/25/24 0848   nicotine  (NICODERM CQ  - dosed in mg/24 hours) patch 14 mg  14 mg Transdermal Daily Foust, Katy L, NP   14 mg at 12/25/24 9150   nicotine  polacrilex (NICORETTE ) gum 2 mg  2 mg Oral PRN Foust, Katy L, NP       ondansetron  (ZOFRAN ) tablet 4 mg  4 mg Oral Q8H PRN Josette Ade, MD   4 mg at 12/21/24 1122   Oral care mouth rinse  15 mL Mouth Rinse PRN Josette Ade, MD       pantoprazole  (PROTONIX ) EC tablet 40 mg  40 mg Oral Daily Foust, Katy L, NP   40 mg at 12/25/24 0848   QUEtiapine  (SEROQUEL ) tablet 50 mg  50 mg Oral QHS Josette Ade, MD   50 mg at 12/24/24 2138   rivaroxaban  (XARELTO ) tablet 10 mg  10 mg Oral Q supper Josette Ade, MD   10 mg at 12/24/24 1715   sodium chloride  (OCEAN) 0.65 % nasal spray 1 spray  1 spray Each Nare PRN Patel, Sona, MD       tuberculin injection 5 Units  5 Units Intradermal Once Alexander, Natalie, DO   5 Units at 12/24/24 1211   umeclidinium-vilanterol (ANORO ELLIPTA ) 62.5-25 MCG/ACT 1 puff  1 puff Inhalation Daily Amin, Sumayya, MD   1 puff at 12/25/24 0847     Discharge Medications: Medication List       TAKE these medications     acetaminophen  325 MG tablet Commonly known as: TYLENOL  Take 1-2 tablets (325-650 mg total) by mouth every 6 (six) hours as needed for mild pain (pain score 1-3), moderate pain (pain score 4-6) or fever.    acetaminophen -caffeine  500-65 MG Tabs per tablet Commonly known as: EXCEDRIN  TENSION HEADACHE Take 1 tablet by mouth every 8 (eight) hours as needed (headache).    albuterol  108 (90 Base) MCG/ACT inhaler Commonly known as: VENTOLIN  HFA Inhale 1-2 puffs into the lungs every 4 (four) hours as needed for wheezing or shortness of breath. What changed:  how much to take when to take this    ALPRAZolam  0.25 MG tablet Commonly known as: XANAX  Take 1 tablet (0.25 mg total) by mouth 2 (two) times daily as needed for anxiety. #60 for 30 days - try to use  sparingly / wean off as able    artificial tears ophthalmic solution Place 1 drop into both eyes as needed for dry eyes.    escitalopram  20 MG tablet Commonly known as: LEXAPRO  Take 1 tablet (20 mg total) by mouth daily. Start taking on: December 26, 2024    furosemide  40 MG tablet Commonly known as: Lasix  Take 1 tablet (40 mg total) by mouth daily as needed. as needed for up to 3  days for increased leg swelling, shortness of breath, weight gain 5+ lbs over 1-2 days. Seek medical care if these symptoms are not improving    guaiFENesin  600 MG 12 hr tablet Commonly known as: MUCINEX  Take 1 tablet (600 mg total) by mouth 2 (two) times daily as needed for cough or to loosen phlegm.    loratadine  10 MG tablet Commonly known as: CLARITIN  Take 1 tablet (10 mg total) by mouth daily as needed for allergies.    melatonin 5 MG Tabs Take 1 tablet (5 mg total) by mouth at bedtime as needed.    multivitamin with minerals Tabs tablet Take 1 tablet by mouth daily. Start taking on: December 26, 2024    nicotine  14 mg/24hr patch Commonly known as: NICODERM CQ  - dosed in mg/24 hours Place 1 patch (14 mg total) onto the skin daily. Start taking on: December 26, 2024    nicotine  polacrilex 2 MG gum Commonly known as: NICORETTE  Take 1 each (2 mg total) by mouth as needed for smoking cessation.    ondansetron  4 MG tablet Commonly known as: ZOFRAN  Take 1 tablet (4 mg total) by mouth every 8 (eight) hours as needed for nausea or vomiting.    pantoprazole  40 MG tablet Commonly known as: PROTONIX  Take 1 tablet (40 mg total) by mouth daily. Start taking on: December 26, 2024    QUEtiapine  50 MG tablet Commonly known as: SEROQUEL  Take 1 tablet (50 mg total) by mouth at bedtime.    rivaroxaban  10 MG Tabs tablet Commonly known as: XARELTO  Take 1 tablet (10 mg total) by mouth daily.    umeclidinium-vilanterol 62.5-25 MCG/ACT Aepb Commonly known as: ANORO ELLIPTA  Inhale 1 puff into the lungs  daily. Start taking on: December 26, 2024              Relevant Imaging Results:  Relevant Lab Results:   Additional Information SSN [id number]  [name], RN     "

## 2024-12-25 NOTE — TOC Progression Note (Signed)
 Transition of Care (TOC) - Progression Note    Patient Details  Name: Corey House. MRN: [id number] Date of Birth: 05/23/1966  Transition of Care [facility]) CM/SW Contact  [name], RN Phone Number: 12/25/2024, 3:41 PM  Clinical Narrative:    [facility] cannot accept today they are still waiting on the financials from [employer].  TOC will f/u on Monday.     Expected Discharge Plan:  (patient is currently homeless, unsure of discharge location at this time) Barriers to Discharge: Financial Resources               Expected Discharge Plan and Services   Discharge Planning Services: CM Consult   Living arrangements for the past 2 months: Homeless Expected Discharge Date: 11/03/24                                     Social Drivers of Health (SDOH) Interventions SDOH Screenings   Food Insecurity: Food Insecurity Present (10/20/2024)  Housing: High Risk (10/21/2024)  Transportation Needs: Unmet Transportation Needs (10/20/2024)  Utilities: At Risk (10/20/2024)  Alcohol  Screen: Low Risk (04/23/2024)  Social Connections: Unknown (04/23/2024)  Tobacco Use: High Risk (10/21/2024)    Readmission Risk Interventions     No data to display

## 2024-12-25 NOTE — Progress Notes (Signed)
 " PROGRESS NOTE    Corey House.   FMW:[id number] DOB: 1966/05/30  DOA: 10/20/2024 Date of Service: 12/25/2024 which is hospital day 66  PCP: Patient, [name]    Hospital course / significant events:   59 y.o. year old male with past medical history of  tobacco use disorder reporting a 41 year pack per year history, smoking ~1 pack per day since he was 59 years old. He denies any additional medical history though he has not seen a medical provider in ~20 years. He presents to [facility] ED with shortness of breath that began ~ 9 months ago and worsened ~3 months ago. He reports today his dyspnea is not significantly different than it was 3 months ago. He endorses a chronic cough that has been present since childhood. Her ports the cough is currently, minimally productive with white phlegm. Additionally, he endorses bilateral lower extremity swelling that began in the last 2 days, per his report (R) > (L). On exam the swelling is equal bilaterally. He endorses fatigue and dyspnea that is worse with position changes such as bending over and when standing from lying. He denies chest pain, palpitations, or fever.   ED Course: On arrival to [facility] ED patient was noted to be afebrile temp 36.8C, BP 142/111, HR 88, RR 22, SPO2 100% on 15L non-rebreather. Patient now on 2L via nasal cannula with SPO2 96%. CXR obtained and shows non specific findings of increased interstitial markings. CTA PE obtained and shows no PE, dilated RV and reflux of contract into the IVC and hepatic vein consistent with (R) heart failure, chronic bronchitis, and emphysema. US  DVT (B)LE negative for DVT. Labs notable Negative COVID, flu, RSV,for BNP 284.2, BUN 24, troponin 41--> 38 with no report of chest pain.  Lactic acid 0.9.  Blood cultures drawn and in process.  He was given Lasix , DuoNebs, and Solu-Medrol .    Patient was treated in the hospital, condition has improved. See A/P belwo for problem based  course. Currently just pending for placement.    Consultants:  Psychiatry Pulmonology   Procedures/Surgeries: none      ASSESSMENT & PLAN:   Acute on chronic respiratory failure with hypoxia and hypercapnia  Multiple attempts were made to wean the patient from oxygen during the hospital.  Pulse ox drops with ambulation on room air (down to 85% last week).  Patient unable to tolerate BiPAP.  Currently on 2 L. Corey House requires volume targeted ventilation via NIV/HMV for nocturnal and daytime use for CRF secondary to COPD. Alarms and internal battery are required to ensure continuous support and immediate recognition of ventilatory insufficiency. A RAD with backup rate is insufficient due to the severity of illness. Volume ventilation is needed to reduce work of breathing and fatigue and to prevent life threatening exacerbations. OSA is not the predominant cause of hypercapnia. PCO2 of 62 was confirmed by ABG.    Nausea Improved.  Patient thinks it secondary to the Wellbutrin .  This medication was discontinued on 1/5. Contineu Zofran  as needed    Depression Increased Seroquel  to 50 mg at night on 12/18/2024.  On Xanax  and Lexapro .  Added Wellbutrin  on 12/22.  Patient asked to come off the Wellbutrin  on 1/5 secondary to feeling nauseous.  Wellbutrin  discontinued. Sent w/ Rx for xanax  bid prn #60 for 30 days, any changes to this will be per PCP, try to use sparingly / wean off to avoid dependence - this was reviewed w/ patient  COPD with acute exacerbation  Treated during the hospital course, continue inhalers scheduled + prn per med rec    Acute on chronic diastolic CHF (congestive heart failure)  EF 55 to 60%.  Off Lasix  at this time and stable, added Lasix  prn LE edema, orthopnea, weight gain 5+ lbs over 1-2 days   Tobacco use disorder On NicoDerm patch scheduled and gum prn    Increased anion gap metabolic acidosis Resolved   Hypernatremia Resolved   Thrombocytosis Continue  to monitor intermittently CBC   Protein-calorie malnutrition, severe Advise balanced diet, increased protein intake   PPD test Placed 12/24/2024  Needs read in 48h or can check CXR  Abn CXR  CT to eval pending results      No concerns based on BMI: Body mass index is 23.19 kg/m.SABRA Significantly low or high BMI is associated with higher medical risk.  Underweight - under 18  overweight - 25 to 29 obese - 30 or more Class 1 obesity: BMI of 30.0 to 34 Class 2 obesity: BMI of 35.0 to 39 Class 3 obesity: BMI of 40.0 to 49 Super Morbid Obesity: BMI 50-59 Super-super Morbid Obesity: BMI 60+ Healthy nutrition and physical activity advised as adjunct to other disease management and risk reduction treatments    DVT prophylaxis: xarelto  IV fluids: no continuous IV fluids  Nutrition: low salt Central lines / other devices: none  Code Status: FULL CODE ACP documentation reviewed:  none on file in [facility]  TOC needs: placement Medical barriers to dispo: none.             Subjective / Brief ROS:  Patient reports feeling fine today no concerns Denies CP/SOB.  Pain controlled.  Denies new weakness.  Tolerating diet.  Reports no concerns w/ urination/defecation.   Family Communication: none    Objective Findings:  Vitals:   12/25/24 0500 12/25/24 0602 12/25/24 0744 12/25/24 1559  BP:  98/74 97/70 113/74  Pulse:  75 71 78  Resp:  18 17 16   Temp:  (!) 97.4 F (36.3 C) 97.8 F (36.6 C) 98.7 F (37.1 C)  TempSrc:    Oral  SpO2:  94% 96% 96%  Weight: 63.2 kg     Height:        Intake/Output Summary (Last 24 hours) at 12/25/2024 1608 Last data filed at 12/25/2024 1300 Gross per 24 hour  Intake 480 ml  Output --  Net 480 ml   Filed Weights   12/23/24 0358 12/24/24 0456 12/25/24 0500  Weight: 61.3 kg 61.3 kg 63.2 kg    Examination:  Physical Exam Constitutional:      General: He is not in acute distress. Cardiovascular:     Rate and Rhythm: Normal rate and  regular rhythm.  Pulmonary:     Effort: Pulmonary effort is normal.     Breath sounds: Normal breath sounds.  Musculoskeletal:     Right lower leg: No edema.     Left lower leg: No edema.  Skin:    General: Skin is warm and dry.  Neurological:     Mental Status: He is alert and oriented to person, place, and time.          Scheduled Medications:   escitalopram   20 mg Oral Daily   guaiFENesin   600 mg Oral BID   multivitamin with minerals  1 tablet Oral Daily   nicotine   14 mg Transdermal Daily   pantoprazole   40 mg Oral Daily   QUEtiapine   50 mg Oral  QHS   rivaroxaban   10 mg Oral Q supper   tuberculin  5 Units Intradermal Once   umeclidinium-vilanterol  1 puff Inhalation Daily    Continuous Infusions:   PRN Medications:  acetaminophen , acetaminophen -caffeine , albuterol , ALPRAZolam , artificial tears, loratadine , melatonin, nicotine  polacrilex, ondansetron , mouth rinse, sodium chloride   Antimicrobials from admission:  Anti-infectives (From admission, onward)    Start     Dose/Rate Route Frequency Ordered Stop   12/03/24 1300  doxycycline  (VIBRA -TABS) tablet 100 mg        100 mg Oral Every 12 hours 12/03/24 1212 12/09/24 2108   10/21/24 1445  azithromycin  (ZITHROMAX ) tablet 250 mg  Status:  Discontinued        250 mg Oral Daily 10/21/24 1351 11/11/24 1809           Data Reviewed:  I have personally reviewed the following...  CBC: Recent Labs  Lab 12/21/24 0622  WBC 5.2  HGB 12.6*  HCT 38.5*  MCV 90.6  PLT 427*   Basic Metabolic Panel: Recent Labs  Lab 12/21/24 0622  NA 141  K 4.0  CL 103  CO2 29  GLUCOSE 103*  BUN 15  CREATININE 0.76  CALCIUM 9.5   GFR: Estimated Creatinine Clearance: 85.3 mL/min (by C-G formula based on SCr of 0.76 mg/dL). Liver Function Tests: No results for input(s): AST, ALT, ALKPHOS, BILITOT, PROT, ALBUMIN in the last 168 hours. No results for input(s): LIPASE, AMYLASE in the last 168 hours. No  results for input(s): AMMONIA in the last 168 hours. Coagulation Profile: No results for input(s): INR, PROTIME in the last 168 hours. Cardiac Enzymes: No results for input(s): CKTOTAL, CKMB, CKMBINDEX, TROPONINI in the last 168 hours. BNP (last 3 results) Recent Labs    10/29/24 0608  PROBNP <50.0   HbA1C: No results for input(s): HGBA1C in the last 72 hours. CBG: No results for input(s): GLUCAP in the last 168 hours. Lipid Profile: No results for input(s): CHOL, HDL, LDLCALC, TRIG, CHOLHDL, LDLDIRECT in the last 72 hours. Thyroid Function Tests: No results for input(s): TSH, T4TOTAL, FREET4, T3FREE, THYROIDAB in the last 72 hours. Anemia Panel: No results for input(s): VITAMINB12, FOLATE, FERRITIN, TIBC, IRON, RETICCTPCT in the last 72 hours. Most Recent Urinalysis On File:     Component Value Date/Time   COLORURINE YELLOW (A) 01/03/2023 2105   APPEARANCEUR CLEAR (A) 01/03/2023 2105   LABSPEC 1.025 01/03/2023 2105   PHURINE 5.0 01/03/2023 2105   GLUCOSEU NEGATIVE 01/03/2023 2105   HGBUR NEGATIVE 01/03/2023 2105   BILIRUBINUR NEGATIVE 01/03/2023 2105   KETONESUR 20 (A) 01/03/2023 2105   PROTEINUR 30 (A) 01/03/2023 2105   NITRITE NEGATIVE 01/03/2023 2105   LEUKOCYTESUR NEGATIVE 01/03/2023 2105   Sepsis Labs: @LABRCNTIP (procalcitonin:4,lacticidven:4) Microbiology: No results found for this or any previous visit (from the past 240 hours).    Radiology Studies last 3 days: DG Chest 2 View Result Date: 12/24/2024 CLINICAL DATA:  Screening for TB EXAM: CHEST - 2 VIEW COMPARISON:  10/22/2024, chest CT 10/20/2024, 01/03/2023, 11/14/2019 FINDINGS: Emphysema. No pleural effusion. Increased interstitial opacity within the left infrahilar lung with ill-defined right hilar/perihilar opacity. IMPRESSION: Emphysema. Increased interstitial opacity within the left infrahilar lung with ill-defined right hilar/perihilar opacity. Findings  are indeterminate for acute on chronic bronchitic changes or early pneumonia. Short-term radiographic follow-up suggested, alternatively chest CT could be obtained for further evaluation. Electronically Signed   By: Luke Bun M.D.   On: 12/24/2024 19:56         Shaquoia Miers, DO  [facility] 12/25/2024, 4:08 PM    Dictation software may have been used to generate the above note. Typos may occur and escape review in typed/dictated notes. Please contact Dr Marsa directly for clarity if needed.  Staff may message me via secure chat in Epic  but this may not receive an immediate response,  please page me for urgent matters!  If 7PM-7AM, please contact night coverage [email or web address]       "

## 2024-12-25 NOTE — TOC Progression Note (Signed)
 Transition of Care (TOC) - Progression Note    Patient Details  Name: Corey House. MRN: [id number] Date of Birth: 12/02/1966  Transition of [facility]) CM/SW Contact  [name], RN Phone Number: 12/25/2024, 9:19 AM  Clinical Narrative:    CM spoke with Nadeen yesterday afternoon, asked that she reach out to the oxygen company [employer] as they had some questions about the care home.  She also says that they can accept the patient once [facility] has authorized the payment for care to the home.  Chest X ray has been completed, working on Care Plan and will get signed.  FL2 updated, all of this will be sent to [facility] to review.  Provider will send the prescriptions to [facility] in [place].  [employer]  needs an updated oxygen qualifying note, mobility specialist is going to perform that with patient now.   Called and spoke with Zelvia with [employer] and she will reach out to the team about authorization for payment.  If we cannot discharge patient today the soonest will be Monday as the care home does not do weekend admissions.    Expected Discharge Plan:  (patient is currently homeless, unsure of discharge location at this time) Barriers to Discharge: Financial Resources               Expected Discharge Plan and Services   Discharge Planning Services: CM Consult   Living arrangements for the past 2 months: Homeless Expected Discharge Date: 11/03/24                                     Social Drivers of Health (SDOH) Interventions SDOH Screenings   Food Insecurity: Food Insecurity Present (10/20/2024)  Housing: High Risk (10/21/2024)  Transportation Needs: Unmet Transportation Needs (10/20/2024)  Utilities: At Risk (10/20/2024)  Alcohol  Screen: Low Risk (04/23/2024)  Social Connections: Unknown (04/23/2024)  Tobacco Use: High Risk (10/21/2024)    Readmission Risk Interventions     No data to display

## 2024-12-25 NOTE — Plan of Care (Signed)

## 2024-12-25 NOTE — Progress Notes (Addendum)
 Mobility Specialist - Progress Note  *Supine position pt O2 @ 90% on RA  On O2 At 2 L Pre-mobility: HR-77, SpO2-93%  During mobility: HR-90, SpO2-86% recovered to 92% in < 30 sec.  Post-mobility: HR-91,  SPO2-86% recovered to 91% in < 30 sec.   12/25/24 0900  Mobility  Activity Ambulated with assistance;Stood at bedside;Dangled on edge of bed  Level of Assistance Independent after set-up  Assistive Device None (O2 Tank)  Distance Ambulated (ft) 150 ft  Range of Motion/Exercises Active  Activity Response Tolerated well  Mobility visit 1 Mobility  Mobility Specialist Start Time (ACUTE ONLY) 0912  Mobility Specialist Stop Time (ACUTE ONLY) [id number]  Mobility Specialist Time Calculation (min) (ACUTE ONLY) 10 min   Pt was supine in bed with the HOB elevated on O2 @ 2 L. Pt agreed to mobility. Pt is able today to get to the EOB independently with bed features. Pt is able today to STS independently with no AD. Pt ambulated well. Pt initial O2 on RA was 90% at supine position. Pt did experience Desat at 23ft to 86%. Pt recovered to above 90% in less than 30 sec to 91%. Pt continued to ambulate. After activity pt returned back in the room and O2 vitals again dropped to 86% and after 30 sec recovered to above 90%. Pt is in bed with needs in reach.  Clem Rodes Mobility Specialist 12/25/2024, 9:48 AM

## 2024-12-26 DIAGNOSIS — J9622 Acute and chronic respiratory failure with hypercapnia: Secondary | ICD-10-CM | POA: Diagnosis not present

## 2024-12-26 DIAGNOSIS — J9621 Acute and chronic respiratory failure with hypoxia: Secondary | ICD-10-CM | POA: Diagnosis not present

## 2024-12-26 MED ORDER — AZITHROMYCIN 500 MG PO TABS
500.0000 mg | ORAL_TABLET | Freq: Every day | ORAL | Status: AC
  Administered 2024-12-26 – 2024-12-30 (×5): 500 mg via ORAL
  Filled 2024-12-26 (×5): qty 1

## 2024-12-26 MED ORDER — AMOXICILLIN-POT CLAVULANATE 875-125 MG PO TABS
1.0000 | ORAL_TABLET | Freq: Two times a day (BID) | ORAL | Status: DC
  Administered 2024-12-26 – 2024-12-28 (×5): 1 via ORAL
  Filled 2024-12-26 (×5): qty 1

## 2024-12-26 NOTE — Plan of Care (Signed)
" °  Problem: Clinical Measurements: Goal: Ability to maintain clinical measurements within normal limits will improve Outcome: Progressing Goal: Cardiovascular complication will be avoided Outcome: Progressing   Problem: Nutrition: Goal: Adequate nutrition will be maintained Outcome: Progressing   Problem: Elimination: Goal: Will not experience complications related to bowel motility Outcome: Progressing Goal: Will not experience complications related to urinary retention Outcome: Progressing   Problem: Pain Managment: Goal: General experience of comfort will improve and/or be controlled Outcome: Progressing   Problem: Safety: Goal: Ability to remain free from injury will improve Outcome: Progressing   Problem: Skin Integrity: Goal: Risk for impaired skin integrity will decrease Outcome: Progressing   Problem: Education: Goal: Knowledge of disease or condition will improve Outcome: Progressing   "

## 2024-12-26 NOTE — Progress Notes (Signed)
 PPD read negative.  No induration noted.

## 2024-12-26 NOTE — Progress Notes (Signed)
 " PROGRESS NOTE    Corey House.   FMW:[id number] DOB: 1966/12/12  DOA: 10/20/2024 Date of Service: 12/26/2024 which is hospital day 67  PCP: Patient, [name]    Hospital course / significant events:   59 y.o. year old male with past medical history of  tobacco use disorder reporting a 41 year pack per year history, smoking ~1 pack per day since he was 59 years old. He denies any additional medical history though he has not seen a medical provider in ~20 years. He presents to [facility] ED with shortness of breath that began ~ 9 months ago and worsened ~3 months ago. He reports today his dyspnea is not significantly different than it was 3 months ago. He endorses a chronic cough that has been present since childhood. Her ports the cough is currently, minimally productive with white phlegm. Additionally, he endorses bilateral lower extremity swelling that began in the last 2 days, per his report (R) > (L). On exam the swelling is equal bilaterally. He endorses fatigue and dyspnea that is worse with position changes such as bending over and when standing from lying. He denies chest pain, palpitations, or fever.   ED Course: On arrival to [facility] ED patient was noted to be afebrile temp 36.8C, BP 142/111, HR 88, RR 22, SPO2 100% on 15L non-rebreather. Patient now on 2L via nasal cannula with SPO2 96%. CXR obtained and shows non specific findings of increased interstitial markings. CTA PE obtained and shows no PE, dilated RV and reflux of contract into the IVC and hepatic vein consistent with (R) heart failure, chronic bronchitis, and emphysema. US  DVT (B)LE negative for DVT. Labs notable Negative COVID, flu, RSV,for BNP 284.2, BUN 24, troponin 41--> 38 with no report of chest pain.  Lactic acid 0.9.  Blood cultures drawn and in process.  He was given Lasix , DuoNebs, and Solu-Medrol .    Patient was treated in the hospital, condition has improved. See A/P belwo for problem based  course. Currently just pending for placement.  01/09 CXR for TB r/o abn, --> CT chest concern for pneumonia, 01/10 pt reports no increased SOB or coughing but now that he considers it, he has been a lot more fatigued past few days. Started augmentin  + azithro, advised increased IS use and increased ambulation to encourage ventilation - CT images reviewed, PNA areas of concern lower posterior lungs question persistent atelectasis / hypoventilation increasing risk for infection.     Consultants:  Psychiatry Pulmonology   Procedures/Surgeries: none      ASSESSMENT & PLAN:   Acute on chronic respiratory failure with hypoxia and hypercapnia  Multiple attempts were made to wean the patient from oxygen during the hospital.  Pulse ox drops with ambulation on room air (down to 85% last week).  Patient unable to tolerate BiPAP.  Currently on 2 L. Corey House requires volume targeted ventilation via NIV/HMV for nocturnal and daytime use for CRF secondary to COPD. Alarms and internal battery are required to ensure continuous support and immediate recognition of ventilatory insufficiency. A RAD with backup rate is insufficient due to the severity of illness. Volume ventilation is needed to reduce work of breathing and fatigue and to prevent life threatening exacerbations. OSA is not the predominant cause of hypercapnia. PCO2 of 62 was confirmed by ABG.    Pneumonia 12/26/2024  Started augmentin  + azithro, advised increased IS use and increased ambulation to encourage ventilation - CT images reviewed, PNA areas of concern  lower posterior lungs question persistent atelectasis / hypoventilation increasing risk for infection.   Nausea Improved.  Patient thinks it secondary to the Wellbutrin .  This medication was discontinued on 1/5. Contineu Zofran  as needed    Depression Increased Seroquel  to 50 mg at night on 12/18/2024.  On Xanax  and Lexapro .  Added Wellbutrin  on 12/22.  Patient asked to come off the  Wellbutrin  on 1/5 secondary to feeling nauseous.  Wellbutrin  discontinued. Sent w/ Rx for xanax  bid prn #60 for 30 days, any changes to this will be per PCP, try to use sparingly / wean off to avoid dependence - this was reviewed w/ patient    COPD with acute exacerbation  Treated during the hospital course, continue inhalers scheduled + prn per med rec    Acute on chronic diastolic CHF (congestive heart failure)  EF 55 to 60%.  Off Lasix  at this time and stable, added Lasix  prn LE edema, orthopnea, weight gain 5+ lbs over 1-2 days   Tobacco use disorder On NicoDerm patch scheduled and gum prn    Increased anion gap metabolic acidosis Resolved   Hypernatremia Resolved   Thrombocytosis Continue to monitor intermittently CBC   Protein-calorie malnutrition, severe Advise balanced diet, increased protein intake   PPD test Placed 12/24/2024  Needs read in 48h or can check CXR  Abn CXR  CT to eval pending results      No concerns based on BMI: Body mass index is 23.19 kg/m.SABRA Significantly low or high BMI is associated with higher medical risk.  Underweight - under 18  overweight - 25 to 29 obese - 30 or more Class 1 obesity: BMI of 30.0 to 34 Class 2 obesity: BMI of 35.0 to 39 Class 3 obesity: BMI of 40.0 to 49 Super Morbid Obesity: BMI 50-59 Super-super Morbid Obesity: BMI 60+ Healthy nutrition and physical activity advised as adjunct to other disease management and risk reduction treatments    DVT prophylaxis: xarelto  IV fluids: no continuous IV fluids  Nutrition: low salt Central lines / other devices: none  Code Status: FULL CODE ACP documentation reviewed:  none on file in [facility]  TOC needs: placement Medical barriers to dispo: none.             Subjective / Brief ROS:  Patient reports feeling fine today no concerns When promted re pneumonia sypmtoms he just notes more fatigue past couple days  Denies CP/SOB.  Pain controlled.  Denies new weakness.   Tolerating diet.  Reports no concerns w/ urination/defecation.   Family Communication: none    Objective Findings:  Vitals:   12/25/24 1927 12/26/24 0446 12/26/24 0500 12/26/24 0807  BP: 112/81 93/67  99/74  Pulse: 73 71  76  Resp: 16 12  18   Temp: 98.1 F (36.7 C) 97.6 F (36.4 C)  99.6 F (37.6 C)  TempSrc: Oral Oral    SpO2: 94% 94%  95%  Weight:   62.3 kg   Height:       No intake or output data in the 24 hours ending 12/26/24 1310  Filed Weights   12/24/24 0456 12/25/24 0500 12/26/24 0500  Weight: 61.3 kg 63.2 kg 62.3 kg    Examination:  Physical Exam Constitutional:      General: He is not in acute distress. Cardiovascular:     Rate and Rhythm: Normal rate and regular rhythm.  Pulmonary:     Effort: Pulmonary effort is normal.     Breath sounds: Examination of the right-lower field  reveals rales. Examination of the left-lower field reveals rales. Decreased breath sounds and rales (c/w atelectasis) present.  Musculoskeletal:     Right lower leg: No edema.     Left lower leg: No edema.  Skin:    General: Skin is warm and dry.  Neurological:     Mental Status: He is alert and oriented to person, place, and time.          Scheduled Medications:   amoxicillin -clavulanate  1 tablet Oral Q12H   azithromycin   500 mg Oral Daily   escitalopram   20 mg Oral Daily   guaiFENesin   600 mg Oral BID   multivitamin with minerals  1 tablet Oral Daily   nicotine   14 mg Transdermal Daily   pantoprazole   40 mg Oral Daily   QUEtiapine   50 mg Oral QHS   rivaroxaban   10 mg Oral Q supper   umeclidinium-vilanterol  1 puff Inhalation Daily    Continuous Infusions:   PRN Medications:  acetaminophen , acetaminophen -caffeine , albuterol , ALPRAZolam , artificial tears, loratadine , melatonin, nicotine  polacrilex, ondansetron , mouth rinse, sodium chloride   Antimicrobials from admission:  Anti-infectives (From admission, onward)    Start     Dose/Rate Route Frequency  Ordered Stop   12/26/24 1130  azithromycin  (ZITHROMAX ) tablet 500 mg        500 mg Oral Daily 12/26/24 1030 12/31/24 0959   12/26/24 1130  amoxicillin -clavulanate (AUGMENTIN ) 875-125 MG per tablet 1 tablet        1 tablet Oral Every 12 hours 12/26/24 1030 12/31/24 0959   12/03/24 1300  doxycycline  (VIBRA -TABS) tablet 100 mg        100 mg Oral Every 12 hours 12/03/24 1212 12/09/24 2108   10/21/24 1445  azithromycin  (ZITHROMAX ) tablet 250 mg  Status:  Discontinued        250 mg Oral Daily 10/21/24 1351 11/11/24 1809           Data Reviewed:  I have personally reviewed the following...  CBC: Recent Labs  Lab 12/21/24 0622  WBC 5.2  HGB 12.6*  HCT 38.5*  MCV 90.6  PLT 427*   Basic Metabolic Panel: Recent Labs  Lab 12/21/24 0622  NA 141  K 4.0  CL 103  CO2 29  GLUCOSE 103*  BUN 15  CREATININE 0.76  CALCIUM 9.5   GFR: Estimated Creatinine Clearance: 85.3 mL/min (by C-G formula based on SCr of 0.76 mg/dL). Liver Function Tests: No results for input(s): AST, ALT, ALKPHOS, BILITOT, PROT, ALBUMIN in the last 168 hours. No results for input(s): LIPASE, AMYLASE in the last 168 hours. No results for input(s): AMMONIA in the last 168 hours. Coagulation Profile: No results for input(s): INR, PROTIME in the last 168 hours. Cardiac Enzymes: No results for input(s): CKTOTAL, CKMB, CKMBINDEX, TROPONINI in the last 168 hours. BNP (last 3 results) Recent Labs    10/29/24 0608  PROBNP <50.0   HbA1C: No results for input(s): HGBA1C in the last 72 hours. CBG: No results for input(s): GLUCAP in the last 168 hours. Lipid Profile: No results for input(s): CHOL, HDL, LDLCALC, TRIG, CHOLHDL, LDLDIRECT in the last 72 hours. Thyroid Function Tests: No results for input(s): TSH, T4TOTAL, FREET4, T3FREE, THYROIDAB in the last 72 hours. Anemia Panel: No results for input(s): VITAMINB12, FOLATE, FERRITIN, TIBC, IRON,  RETICCTPCT in the last 72 hours. Most Recent Urinalysis On File:     Component Value Date/Time   COLORURINE YELLOW (A) 01/03/2023 2105   APPEARANCEUR CLEAR (A) 01/03/2023 2105   LABSPEC 1.025  01/03/2023 2105   PHURINE 5.0 01/03/2023 2105   GLUCOSEU NEGATIVE 01/03/2023 2105   HGBUR NEGATIVE 01/03/2023 2105   BILIRUBINUR NEGATIVE 01/03/2023 2105   KETONESUR 20 (A) 01/03/2023 2105   PROTEINUR 30 (A) 01/03/2023 2105   NITRITE NEGATIVE 01/03/2023 2105   LEUKOCYTESUR NEGATIVE 01/03/2023 2105   Sepsis Labs: @LABRCNTIP (procalcitonin:4,lacticidven:4) Microbiology: No results found for this or any previous visit (from the past 240 hours).    Radiology Studies last 3 days: CT CHEST WO CONTRAST Result Date: 12/25/2024 EXAM: CT CHEST WITHOUT CONTRAST 12/25/2024 01:23:18 PM TECHNIQUE: CT of the chest was performed without the administration of intravenous contrast. Multiplanar reformatted images are provided for review. Automated exposure control, iterative reconstruction, and/or weight based adjustment of the mA/kV was utilized to reduce the radiation dose to as low as reasonably achievable. COMPARISON: CT chest 10/20/2024. CLINICAL HISTORY: Pneumonia, complication suspected, xray done. FINDINGS: MEDIASTINUM: Heart and pericardium are unremarkable. The central airways are clear. LYMPH NODES: No mediastinal, hilar or axillary lymphadenopathy. LUNGS AND PLEURA: Multiple focal areas of airspace opacity throughout the bilateral lower lobes, slightly masslike in configuration. Findings are likely related to multifocal pneumonia. There is a new trace right pleural effusion. Moderate emphysema present. There are 2 new pulmonary nodules in the right upper lobe measuring up to 4 mm. There is a new ill-defined nodular density in the inferior left upper lobe measuring 8 mm (image 4/84). No pneumothorax. SOFT TISSUES/BONES: No acute abnormality of the bones or soft tissues. UPPER ABDOMEN: Limited images of the upper  abdomen demonstrate peripunctate calcifications in the left kidney. IMPRESSION: 1. Findings most consistent with multifocal pneumonia the bilateral lower lobes. 2. New trace right pleural effusion. 3. Moderate emphysema, which is an independent risk factor for lung cancer; recommend consideration for evaluation for a low-dose CT lung cancer screening program. 4. Multiple pulmonary nodules measuring up to 8 mm; recommend non-contrast chest CT at 3-6 months, then repeat non-contrast chest CT at 18-24 months, as per Fleischner Society Guidelines. Electronically signed by: Greig Pique MD MD 12/25/2024 08:39 PM EST RP Workstation: [id number]   DG Chest 2 View Result Date: 12/24/2024 CLINICAL DATA:  Screening for TB EXAM: CHEST - 2 VIEW COMPARISON:  10/22/2024, chest CT 10/20/2024, 01/03/2023, 11/14/2019 FINDINGS: Emphysema. No pleural effusion. Increased interstitial opacity within the left infrahilar lung with ill-defined right hilar/perihilar opacity. IMPRESSION: Emphysema. Increased interstitial opacity within the left infrahilar lung with ill-defined right hilar/perihilar opacity. Findings are indeterminate for acute on chronic bronchitic changes or early pneumonia. Short-term radiographic follow-up suggested, alternatively chest CT could be obtained for further evaluation. Electronically Signed   By: Luke Bun M.D.   On: 12/24/2024 19:56         Corey Mckibbin, DO [facility] 12/26/2024, 1:10 PM    Dictation software may have been used to generate the above note. Typos may occur and escape review in typed/dictated notes. Please contact Dr Marsa directly for clarity if needed.  Staff may message me via secure chat in Epic  but this may not receive an immediate response,  please page me for urgent matters!  If 7PM-7AM, please contact night coverage [email or web address]       "

## 2024-12-26 NOTE — Plan of Care (Signed)

## 2024-12-27 NOTE — Plan of Care (Signed)

## 2024-12-27 NOTE — Progress Notes (Signed)
 " PROGRESS NOTE    Corey House.   FMW:[id number] DOB: July 18, 1966  DOA: 10/20/2024 Date of Service: 12/27/2024 which is hospital day 43  PCP: Patient, [name]    Hospital course / significant events:   59 y.o. year old male with past medical history of  tobacco use disorder reporting a 41 year pack per year history, smoking ~1 pack per day since he was 59 years old. He denies any additional medical history though he has not seen a medical provider in ~20 years. He presents to [facility] ED with shortness of breath that began ~ 9 months ago and worsened ~3 months ago. He reports today his dyspnea is not significantly different than it was 3 months ago. He endorses a chronic cough that has been present since childhood. Her ports the cough is currently, minimally productive with white phlegm. Additionally, he endorses bilateral lower extremity swelling that began in the last 2 days, per his report (R) > (L). On exam the swelling is equal bilaterally. He endorses fatigue and dyspnea that is worse with position changes such as bending over and when standing from lying. He denies chest pain, palpitations, or fever.   ED Course: On arrival to [facility] ED patient was noted to be afebrile temp 36.8C, BP 142/111, HR 88, RR 22, SPO2 100% on 15L non-rebreather. Patient now on 2L via nasal cannula with SPO2 96%. CXR obtained and shows non specific findings of increased interstitial markings. CTA PE obtained and shows no PE, dilated RV and reflux of contract into the IVC and hepatic vein consistent with (R) heart failure, chronic bronchitis, and emphysema. US  DVT (B)LE negative for DVT. Labs notable Negative COVID, flu, RSV,for BNP 284.2, BUN 24, troponin 41--> 38 with no report of chest pain.  Lactic acid 0.9.  Blood cultures drawn and in process.  He was given Lasix , DuoNebs, and Solu-Medrol .    Patient was treated in the hospital, condition has improved. See A/P belwo for problem based  course. Currently just pending for placement.  01/09 CXR for TB r/o abn, --> CT chest concern for pneumonia, 01/10 pt reports no increased SOB or coughing but now that he considers it, he has been a lot more fatigued past few days. Started augmentin  + azithro, advised increased IS use and increased ambulation to encourage ventilation - CT images reviewed, PNA areas of concern lower posterior lungs question persistent atelectasis / hypoventilation increasing risk for infection.   01/10: PPD negative    Consultants:  Psychiatry Pulmonology   Procedures/Surgeries: none      ASSESSMENT & PLAN:   Acute on chronic respiratory failure with hypoxia and hypercapnia  Multiple attempts were made to wean the patient from oxygen during the hospital.  Pulse ox drops with ambulation on room air (down to 85% last week).  Patient unable to tolerate BiPAP.  Currently on 2 L. [name] requires volume targeted ventilation via NIV/HMV for nocturnal and daytime use for CRF secondary to COPD. Alarms and internal battery are required to ensure continuous support and immediate recognition of ventilatory insufficiency. A RAD with backup rate is insufficient due to the severity of illness. Volume ventilation is needed to reduce work of breathing and fatigue and to prevent life threatening exacerbations. OSA is not the predominant cause of hypercapnia. PCO2 of 62 was confirmed by ABG.    Pneumonia 12/26/2024  Started augmentin  + azithro, advised increased IS use and increased ambulation to encourage ventilation - CT images reviewed,  PNA areas of concern lower posterior lungs question persistent atelectasis / hypoventilation increasing risk for infection.   Nausea Improved.  Patient thinks it secondary to the Wellbutrin .  This medication was discontinued on 1/5. Contineu Zofran  as needed    Depression Increased Seroquel  to 50 mg at night on 12/18/2024.  On Xanax  and Lexapro .  Added Wellbutrin  on 12/22.  Patient asked  to come off the Wellbutrin  on 1/5 secondary to feeling nauseous.  Wellbutrin  discontinued. Sent w/ Rx for xanax  bid prn #60 for 30 days, any changes to this will be per PCP, try to use sparingly / wean off to avoid dependence - this was reviewed w/ patient    COPD with acute exacerbation  Treated during the hospital course, continue inhalers scheduled + prn per med rec    Acute on chronic diastolic CHF (congestive heart failure)  EF 55 to 60%.  Off Lasix  at this time and stable, added Lasix  prn LE edema, orthopnea, weight gain 5+ lbs over 1-2 days   Tobacco use disorder On NicoDerm patch scheduled and gum prn    Increased anion gap metabolic acidosis Resolved   Hypernatremia Resolved   Thrombocytosis Continue to monitor intermittently CBC   Protein-calorie malnutrition, severe Advise balanced diet, increased protein intake   PPD test Placed 12/24/2024  Needs read in 48h or can check CXR  Abn CXR  CT to eval pending results      No concerns based on BMI: Body mass index is 23.19 kg/m.SABRA Significantly low or high BMI is associated with higher medical risk.  Underweight - under 18  overweight - 25 to 29 obese - 30 or more Class 1 obesity: BMI of 30.0 to 34 Class 2 obesity: BMI of 35.0 to 39 Class 3 obesity: BMI of 40.0 to 49 Super Morbid Obesity: BMI 50-59 Super-super Morbid Obesity: BMI 60+ Healthy nutrition and physical activity advised as adjunct to other disease management and risk reduction treatments    DVT prophylaxis: xarelto  IV fluids: no continuous IV fluids  Nutrition: low salt Central lines / other devices: none  Code Status: FULL CODE ACP documentation reviewed:  none on file in [facility]  TOC needs: placement Medical barriers to dispo: none.             Subjective / Brief ROS:  Patient has no concerns today    Family Communication: none    Objective Findings:  Vitals:   12/26/24 1549 12/27/24 0350 12/27/24 0500 12/27/24 0743  BP:  105/74 103/72  106/74  Pulse: 74 76  76  Resp: 17 18  16   Temp: 97.7 F (36.5 C) 97.8 F (36.6 C)  97.7 F (36.5 C)  TempSrc:  Oral  Oral  SpO2: 95% 94%  96%  Weight:   61.9 kg   Height:       No intake or output data in the 24 hours ending 12/27/24 1224  Filed Weights   12/25/24 0500 12/26/24 0500 12/27/24 0500  Weight: 63.2 kg 62.3 kg 61.9 kg    Examination:  Physical Exam Constitutional:      General: He is not in acute distress. Cardiovascular:     Rate and Rhythm: Normal rate.  Pulmonary:     Effort: Pulmonary effort is normal.     Breath sounds: Decreased breath sounds present. Rales: c/w atelectasis. Neurological:     Mental Status: He is alert and oriented to person, place, and time.          Scheduled Medications:  amoxicillin -clavulanate  1 tablet Oral Q12H   azithromycin   500 mg Oral Daily   escitalopram   20 mg Oral Daily   guaiFENesin   600 mg Oral BID   multivitamin with minerals  1 tablet Oral Daily   nicotine   14 mg Transdermal Daily   pantoprazole   40 mg Oral Daily   QUEtiapine   50 mg Oral QHS   rivaroxaban   10 mg Oral Q supper   umeclidinium-vilanterol  1 puff Inhalation Daily    Continuous Infusions:   PRN Medications:  acetaminophen , acetaminophen -caffeine , albuterol , ALPRAZolam , artificial tears, loratadine , melatonin, nicotine  polacrilex, ondansetron , mouth rinse, sodium chloride   Antimicrobials from admission:  Anti-infectives (From admission, onward)    Start     Dose/Rate Route Frequency Ordered Stop   12/26/24 1130  azithromycin  (ZITHROMAX ) tablet 500 mg        500 mg Oral Daily 12/26/24 1030 12/31/24 0959   12/26/24 1130  amoxicillin -clavulanate (AUGMENTIN ) 875-125 MG per tablet 1 tablet        1 tablet Oral Every 12 hours 12/26/24 1030 12/31/24 0959   12/03/24 1300  doxycycline  (VIBRA -TABS) tablet 100 mg        100 mg Oral Every 12 hours 12/03/24 1212 12/09/24 2108   10/21/24 1445  azithromycin  (ZITHROMAX ) tablet 250 mg   Status:  Discontinued        250 mg Oral Daily 10/21/24 1351 11/11/24 1809           Data Reviewed:  I have personally reviewed the following...  CBC: Recent Labs  Lab 12/21/24 0622  WBC 5.2  HGB 12.6*  HCT 38.5*  MCV 90.6  PLT 427*   Basic Metabolic Panel: Recent Labs  Lab 12/21/24 0622  NA 141  K 4.0  CL 103  CO2 29  GLUCOSE 103*  BUN 15  CREATININE 0.76  CALCIUM 9.5   GFR: Estimated Creatinine Clearance: 85.3 mL/min (by C-G formula based on SCr of 0.76 mg/dL). Liver Function Tests: No results for input(s): AST, ALT, ALKPHOS, BILITOT, PROT, ALBUMIN in the last 168 hours. No results for input(s): LIPASE, AMYLASE in the last 168 hours. No results for input(s): AMMONIA in the last 168 hours. Coagulation Profile: No results for input(s): INR, PROTIME in the last 168 hours. Cardiac Enzymes: No results for input(s): CKTOTAL, CKMB, CKMBINDEX, TROPONINI in the last 168 hours. BNP (last 3 results) Recent Labs    10/29/24 0608  PROBNP <50.0   HbA1C: No results for input(s): HGBA1C in the last 72 hours. CBG: No results for input(s): GLUCAP in the last 168 hours. Lipid Profile: No results for input(s): CHOL, HDL, LDLCALC, TRIG, CHOLHDL, LDLDIRECT in the last 72 hours. Thyroid Function Tests: No results for input(s): TSH, T4TOTAL, FREET4, T3FREE, THYROIDAB in the last 72 hours. Anemia Panel: No results for input(s): VITAMINB12, FOLATE, FERRITIN, TIBC, IRON, RETICCTPCT in the last 72 hours. Most Recent Urinalysis On File:     Component Value Date/Time   COLORURINE YELLOW (A) 01/03/2023 2105   APPEARANCEUR CLEAR (A) 01/03/2023 2105   LABSPEC 1.025 01/03/2023 2105   PHURINE 5.0 01/03/2023 2105   GLUCOSEU NEGATIVE 01/03/2023 2105   HGBUR NEGATIVE 01/03/2023 2105   BILIRUBINUR NEGATIVE 01/03/2023 2105   KETONESUR 20 (A) 01/03/2023 2105   PROTEINUR 30 (A) 01/03/2023 2105   NITRITE NEGATIVE  01/03/2023 2105   LEUKOCYTESUR NEGATIVE 01/03/2023 2105   Sepsis Labs: @LABRCNTIP (procalcitonin:4,lacticidven:4) Microbiology: No results found for this or any previous visit (from the past 240 hours).    Radiology Studies last 3  days: CT CHEST WO CONTRAST Result Date: 12/25/2024 EXAM: CT CHEST WITHOUT CONTRAST 12/25/2024 01:23:18 PM TECHNIQUE: CT of the chest was performed without the administration of intravenous contrast. Multiplanar reformatted images are provided for review. Automated exposure control, iterative reconstruction, and/or weight based adjustment of the mA/kV was utilized to reduce the radiation dose to as low as reasonably achievable. COMPARISON: CT chest 10/20/2024. CLINICAL HISTORY: Pneumonia, complication suspected, xray done. FINDINGS: MEDIASTINUM: Heart and pericardium are unremarkable. The central airways are clear. LYMPH NODES: No mediastinal, hilar or axillary lymphadenopathy. LUNGS AND PLEURA: Multiple focal areas of airspace opacity throughout the bilateral lower lobes, slightly masslike in configuration. Findings are likely related to multifocal pneumonia. There is a new trace right pleural effusion. Moderate emphysema present. There are 2 new pulmonary nodules in the right upper lobe measuring up to 4 mm. There is a new ill-defined nodular density in the inferior left upper lobe measuring 8 mm (image 4/84). No pneumothorax. SOFT TISSUES/BONES: No acute abnormality of the bones or soft tissues. UPPER ABDOMEN: Limited images of the upper abdomen demonstrate peripunctate calcifications in the left kidney. IMPRESSION: 1. Findings most consistent with multifocal pneumonia the bilateral lower lobes. 2. New trace right pleural effusion. 3. Moderate emphysema, which is an independent risk factor for lung cancer; recommend consideration for evaluation for a low-dose CT lung cancer screening program. 4. Multiple pulmonary nodules measuring up to 8 mm; recommend non-contrast chest CT at  3-6 months, then repeat non-contrast chest CT at 18-24 months, as per Fleischner Society Guidelines. Electronically signed by: Greig Pique MD MD 12/25/2024 08:39 PM EST RP Workstation: [id number]   DG Chest 2 View Result Date: 12/24/2024 CLINICAL DATA:  Screening for TB EXAM: CHEST - 2 VIEW COMPARISON:  10/22/2024, chest CT 10/20/2024, 01/03/2023, 11/14/2019 FINDINGS: Emphysema. No pleural effusion. Increased interstitial opacity within the left infrahilar lung with ill-defined right hilar/perihilar opacity. IMPRESSION: Emphysema. Increased interstitial opacity within the left infrahilar lung with ill-defined right hilar/perihilar opacity. Findings are indeterminate for acute on chronic bronchitic changes or early pneumonia. Short-term radiographic follow-up suggested, alternatively chest CT could be obtained for further evaluation. Electronically Signed   By: Luke Bun M.D.   On: 12/24/2024 19:56         Laneta Blunt, DO [facility] 12/27/2024, 12:24 PM    Dictation software may have been used to generate the above note. Typos may occur and escape review in typed/dictated notes. Please contact Dr Blunt directly for clarity if needed.  Staff may message me via secure chat in Epic  but this may not receive an immediate response,  please page me for urgent matters!  If 7PM-7AM, please contact night coverage [email or web address]       "

## 2024-12-27 NOTE — Plan of Care (Signed)

## 2024-12-28 LAB — CBC WITH DIFFERENTIAL/PLATELET
Abs Immature Granulocytes: 0.11 K/uL — ABNORMAL HIGH (ref 0.00–0.07)
Basophils Absolute: 0 K/uL (ref 0.0–0.1)
Basophils Relative: 0 %
Eosinophils Absolute: 0 K/uL (ref 0.0–0.5)
Eosinophils Relative: 0 %
HCT: 42.4 % (ref 39.0–52.0)
Hemoglobin: 14.1 g/dL (ref 13.0–17.0)
Immature Granulocytes: 1 %
Lymphocytes Relative: 11 %
Lymphs Abs: 1 K/uL (ref 0.7–4.0)
MCH: 29.8 pg (ref 26.0–34.0)
MCHC: 33.3 g/dL (ref 30.0–36.0)
MCV: 89.6 fL (ref 80.0–100.0)
Monocytes Absolute: 0.2 K/uL (ref 0.1–1.0)
Monocytes Relative: 2 %
Neutro Abs: 7.5 K/uL (ref 1.7–7.7)
Neutrophils Relative %: 86 %
Platelets: 440 K/uL — ABNORMAL HIGH (ref 150–400)
RBC: 4.73 MIL/uL (ref 4.22–5.81)
RDW: 13.5 % (ref 11.5–15.5)
WBC: 9 K/uL (ref 4.0–10.5)
nRBC: 0 % (ref 0.0–0.2)

## 2024-12-28 LAB — RESP PANEL BY RT-PCR (RSV, FLU A&B, COVID)  RVPGX2
Influenza A by PCR: NEGATIVE
Influenza B by PCR: NEGATIVE
Resp Syncytial Virus by PCR: NEGATIVE
SARS Coronavirus 2 by RT PCR: NEGATIVE

## 2024-12-28 LAB — BASIC METABOLIC PANEL WITH GFR
Anion gap: 11 (ref 5–15)
BUN: 12 mg/dL (ref 6–20)
CO2: 28 mmol/L (ref 22–32)
Calcium: 9.3 mg/dL (ref 8.9–10.3)
Chloride: 103 mmol/L (ref 98–111)
Creatinine, Ser: 0.69 mg/dL (ref 0.61–1.24)
GFR, Estimated: >60 mL/min
Glucose, Bld: 103 mg/dL — ABNORMAL HIGH (ref 70–99)
Potassium: 4.2 mmol/L (ref 3.5–5.1)
Sodium: 142 mmol/L (ref 135–145)

## 2024-12-28 MED ORDER — METHYLPREDNISOLONE SODIUM SUCC 125 MG IJ SOLR
125.0000 mg | Freq: Once | INTRAMUSCULAR | Status: AC
  Administered 2024-12-28: 125 mg via INTRAVENOUS
  Filled 2024-12-28: qty 2

## 2024-12-28 MED ORDER — CLONAZEPAM 0.5 MG PO TABS
0.5000 mg | ORAL_TABLET | Freq: Two times a day (BID) | ORAL | Status: AC | PRN
  Administered 2024-12-28 – 2025-01-22 (×49): 0.5 mg via ORAL
  Filled 2024-12-28 (×50): qty 1

## 2024-12-28 MED ORDER — IPRATROPIUM-ALBUTEROL 0.5-2.5 (3) MG/3ML IN SOLN
3.0000 mL | Freq: Four times a day (QID) | RESPIRATORY_TRACT | Status: DC
  Administered 2024-12-28 – 2024-12-29 (×2): 3 mL via RESPIRATORY_TRACT
  Filled 2024-12-28 (×2): qty 3

## 2024-12-28 MED ORDER — SODIUM CHLORIDE 0.9 % IV SOLN: INTRAVENOUS | Status: AC

## 2024-12-28 MED ORDER — SODIUM CHLORIDE 0.9 % IV SOLN
2.0000 g | INTRAVENOUS | Status: AC
  Administered 2024-12-28 – 2025-01-01 (×5): 2 g via INTRAVENOUS
  Filled 2024-12-28 (×5): qty 20

## 2024-12-28 NOTE — Plan of Care (Signed)

## 2024-12-28 NOTE — Progress Notes (Signed)
 Patient has remained free from any noted signs of acute distress.  Patient has received prn medication to assist with reported anxiety and restlessness, during current night shift.  Patient remains free from any noted signs of associated symptoms.  Patient to continue to be monitored by hospital staff.

## 2024-12-28 NOTE — Progress Notes (Signed)
 " PROGRESS NOTE    Corey House.   FMW:[id number] DOB: 05-21-1966  DOA: 10/20/2024 Date of Service: 12/28/2024 which is hospital day 46  PCP: Patient, [name]    Hospital course / significant events:   59 y.o. year old male with past medical history of  tobacco use disorder reporting a 41 year pack per year history, smoking ~1 pack per day since he was 59 years old. He denies any additional medical history though he has not seen a medical provider in ~20 years. He presents to [facility] ED with shortness of breath that began ~ 9 months ago and worsened ~3 months ago. He reports today his dyspnea is not significantly different than it was 3 months ago. He endorses a chronic cough that has been present since childhood. Her ports the cough is currently, minimally productive with white phlegm. Additionally, he endorses bilateral lower extremity swelling that began in the last 2 days, per his report (R) > (L). On exam the swelling is equal bilaterally. He endorses fatigue and dyspnea that is worse with position changes such as bending over and when standing from lying. He denies chest pain, palpitations, or fever.   ED Course: On arrival to [facility] ED patient was noted to be afebrile temp 36.8C, BP 142/111, HR 88, RR 22, SPO2 100% on 15L non-rebreather. Patient now on 2L via nasal cannula with SPO2 96%. CXR obtained and shows non specific findings of increased interstitial markings. CTA PE obtained and shows no PE, dilated RV and reflux of contract into the IVC and hepatic vein consistent with (R) heart failure, chronic bronchitis, and emphysema. US  DVT (B)LE negative for DVT. Labs notable Negative COVID, flu, RSV,for BNP 284.2, BUN 24, troponin 41--> 38 with no report of chest pain.  Lactic acid 0.9.  Blood cultures drawn and in process.  He was given Lasix , DuoNebs, and Solu-Medrol .    Patient was treated in the hospital, condition has improved. See A/P belwo for problem based  course. Currently just pending for placement.  01/09 CXR for TB r/o abn, --> CT chest concern for pneumonia, 01/10 pt reports no increased SOB or coughing but now that he considers it, he has been a lot more fatigued past few days. Started augmentin  + azithro, advised increased IS use and increased ambulation to encourage ventilation - CT images reviewed, PNA areas of concern lower posterior lungs question persistent atelectasis / hypoventilation increasing risk for infection.   01/10: PPD negative   01/12: pt feeling bad reporting more SOB trouble getting deep breath in, fatigue, jittery when xanax  is wearing off. 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 Flu/COVID swab    Consultants:  Psychiatry Pulmonology   Procedures/Surgeries: none      ASSESSMENT & PLAN:   Acute on chronic respiratory failure with hypoxia and hypercapnia  Multiple attempts were made to wean the patient from oxygen during the hospital.  Pulse ox drops with ambulation on room air (down to 85% last week).  Patient unable to tolerate BiPAP.  Currently on 2 L. [name] requires volume targeted ventilation via NIV/HMV for nocturnal and daytime use for CRF secondary to COPD. Alarms and internal battery are required to ensure continuous support and immediate recognition of ventilatory insufficiency. A  RAD with backup rate is insufficient due to the severity of illness. Volume ventilation is needed to reduce work of breathing and fatigue and to prevent life threatening exacerbations. OSA is not the predominant cause of hypercapnia. PCO2 of 62 was confirmed by ABG.    Pneumonia 12/26/2024  Started augmentin  + azithro, advised increased IS use and increased  ambulation to encourage ventilation - CT images reviewed, PNA areas of concern lower posterior lungs question persistent atelectasis / hypoventilation increasing risk for infection.  01/12: pt feeling bad reporting more SOB trouble getting deep breath in, fatigue, jittery when xanax  is wearing off. 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 Check resp panel   Nausea Improved.  Patient thinks it secondary to the Wellbutrin .  This medication was discontinued on 1/5. Contineu Zofran  as needed    Depression Increased Seroquel  to 50 mg at night on 12/18/2024.  On Xanax  and Lexapro .  Added Wellbutrin  on 12/22.  Patient asked to come off the Wellbutrin  on 1/5 secondary to feeling nauseous.  Wellbutrin  discontinued. Sent w/ Rx for xanax  bid prn #60 for 30 days, any changes to this will be per PCP, try to use sparingly / wean off to avoid dependence - this was reviewed w/ patient    COPD with acute exacerbation  Treated during the hospital course, continue inhalers scheduled + prn per med rec    Acute on chronic diastolic CHF (congestive heart failure)  EF 55 to 60%.  Off Lasix  at this time and stable, added Lasix  prn LE edema, orthopnea, weight gain 5+ lbs over 1-2 days   Tobacco use disorder On NicoDerm patch scheduled and gum prn    Increased anion gap metabolic acidosis Resolved   Hypernatremia Resolved   Thrombocytosis Continue to monitor intermittently CBC   Protein-calorie malnutrition, severe Advise balanced diet, increased protein intake   PPD test Placed 12/24/2024  Needs read in 48h or can check CXR  Abn CXR  CT to eval pending results      No concerns based on BMI: Body mass index is 23.19 kg/m.SABRA Significantly low  or high BMI is associated with higher medical risk.  Underweight - under 18  overweight - 25 to 29 obese - 30 or more Class 1 obesity: BMI of 30.0 to 34 Class 2 obesity: BMI of 35.0 to 39 Class 3 obesity: BMI of 40.0 to 49 Super Morbid Obesity: BMI 50-59 Super-super Morbid Obesity: BMI 60+ Healthy nutrition and physical activity advised as adjunct to other disease management and risk reduction treatments    DVT prophylaxis: xarelto  IV fluids: no continuous IV fluids  Nutrition: low salt Central lines / other devices: none  Code Status: FULL CODE ACP documentation reviewed:  none on file in [facility]  TOC needs: placement Medical barriers to dispo: none.             Subjective / Brief ROS:  Patient feeling bad reporting more SOB trouble getting deep breath in, fatigue, jittery when xanax  is wearing off. Denies chest pain, reports fatigue and occasional chills, reports low appetitie / not eating or drinking much    Family Communication: none    Objective Findings:  Vitals:   12/27/24 2025 12/28/24 0500 12/28/24 0510 12/28/24 0838  BP: 105/82  117/79 117/79  Pulse: 82  78 76  Resp: 20  16 18   Temp: 98.9 F (37.2 C)  (!) 97.3 F (36.3 C) 97.6 F (36.4 C)  TempSrc: Oral     SpO2: 96%  99% 97%  Weight:  61.9 kg    Height:       No intake or output data in the 24 hours ending 12/28/24 0934  Filed Weights   12/26/24 0500 12/27/24 0500 12/28/24 0500  Weight: 62.3 kg 61.9 kg 61.9 kg    Examination:  Physical Exam Constitutional:      General: He is not in acute distress.    Appearance: He is not ill-appearing.  Cardiovascular:     Rate and Rhythm: Normal rate and regular rhythm.  Pulmonary:     Effort: Pulmonary effort is normal.     Breath sounds: Decreased breath sounds present. Rales: c/w atelectasis. Musculoskeletal:     Right lower leg: No edema.     Left lower leg: No edema.  Neurological:     Mental Status: He is alert and oriented to person,  place, and time.  Psychiatric:        Mood and Affect: Mood is anxious.          Scheduled Medications:   azithromycin   500 mg Oral Daily   escitalopram   20 mg Oral Daily   guaiFENesin   600 mg Oral BID   ipratropium-albuterol   3 mL Nebulization Q6H   methylPREDNISolone  (SOLU-MEDROL ) injection  125 mg Intravenous Once   multivitamin with minerals  1 tablet Oral Daily   nicotine   14 mg Transdermal Daily   pantoprazole   40 mg Oral Daily   QUEtiapine   50 mg Oral QHS   rivaroxaban   10 mg Oral Q supper    Continuous Infusions:  sodium chloride      cefTRIAXone  (ROCEPHIN )  IV      PRN Medications:  acetaminophen , acetaminophen -caffeine , albuterol , artificial tears, clonazePAM , loratadine , melatonin, nicotine  polacrilex, ondansetron , mouth rinse, sodium chloride   Antimicrobials from admission:  Anti-infectives (From admission, onward)    Start     Dose/Rate Route Frequency Ordered Stop   12/28/24 1100  cefTRIAXone  (ROCEPHIN ) 2 g in sodium chloride  0.9 % 100 mL IVPB        2 g 200 mL/hr over 30 Minutes Intravenous Every 24 hours 12/28/24 0930 01/02/25 1059   12/26/24 1130  azithromycin  (ZITHROMAX ) tablet 500 mg        500 mg Oral Daily 12/26/24 1030 12/31/24 0959   12/26/24 1130  amoxicillin -clavulanate (AUGMENTIN ) 875-125 MG per tablet 1 tablet  Status:  Discontinued        1 tablet Oral Every 12 hours 12/26/24 1030 12/28/24 0930   12/03/24 1300  doxycycline  (VIBRA -TABS) tablet 100 mg        100 mg Oral Every 12 hours 12/03/24 1212 12/09/24 2108   10/21/24 1445  azithromycin  (ZITHROMAX ) tablet 250 mg  Status:  Discontinued        250 mg Oral Daily 10/21/24 1351 11/11/24 1809           Data Reviewed:  I have personally reviewed the following...  CBC: No results for input(s): WBC, NEUTROABS, HGB, HCT, MCV, PLT in the last 168 hours.  Basic Metabolic Panel: No results for input(s): NA, K, CL, CO2, GLUCOSE, BUN, CREATININE, CALCIUM, MG,  PHOS in the last 168 hours.  GFR: Estimated Creatinine Clearance: 85.3 mL/min (by C-G formula based on SCr of 0.76 mg/dL). Liver Function Tests: No results for input(s): AST, ALT, ALKPHOS, BILITOT, PROT,  ALBUMIN in the last 168 hours. No results for input(s): LIPASE, AMYLASE in the last 168 hours. No results for input(s): AMMONIA in the last 168 hours. Coagulation Profile: No results for input(s): INR, PROTIME in the last 168 hours. Cardiac Enzymes: No results for input(s): CKTOTAL, CKMB, CKMBINDEX, TROPONINI in the last 168 hours. BNP (last 3 results) Recent Labs    10/29/24 0608  PROBNP <50.0   HbA1C: No results for input(s): HGBA1C in the last 72 hours. CBG: No results for input(s): GLUCAP in the last 168 hours. Lipid Profile: No results for input(s): CHOL, HDL, LDLCALC, TRIG, CHOLHDL, LDLDIRECT in the last 72 hours. Thyroid Function Tests: No results for input(s): TSH, T4TOTAL, FREET4, T3FREE, THYROIDAB in the last 72 hours. Anemia Panel: No results for input(s): VITAMINB12, FOLATE, FERRITIN, TIBC, IRON, RETICCTPCT in the last 72 hours. Most Recent Urinalysis On File:     Component Value Date/Time   COLORURINE YELLOW (A) 01/03/2023 2105   APPEARANCEUR CLEAR (A) 01/03/2023 2105   LABSPEC 1.025 01/03/2023 2105   PHURINE 5.0 01/03/2023 2105   GLUCOSEU NEGATIVE 01/03/2023 2105   HGBUR NEGATIVE 01/03/2023 2105   BILIRUBINUR NEGATIVE 01/03/2023 2105   KETONESUR 20 (A) 01/03/2023 2105   PROTEINUR 30 (A) 01/03/2023 2105   NITRITE NEGATIVE 01/03/2023 2105   LEUKOCYTESUR NEGATIVE 01/03/2023 2105   Sepsis Labs: @LABRCNTIP (procalcitonin:4,lacticidven:4) Microbiology: No results found for this or any previous visit (from the past 240 hours).    Radiology Studies last 3 days: CT CHEST WO CONTRAST Result Date: 12/25/2024 EXAM: CT CHEST WITHOUT CONTRAST 12/25/2024 01:23:18 PM TECHNIQUE: CT of the chest was  performed without the administration of intravenous contrast. Multiplanar reformatted images are provided for review. Automated exposure control, iterative reconstruction, and/or weight based adjustment of the mA/kV was utilized to reduce the radiation dose to as low as reasonably achievable. COMPARISON: CT chest 10/20/2024. CLINICAL HISTORY: Pneumonia, complication suspected, xray done. FINDINGS: MEDIASTINUM: Heart and pericardium are unremarkable. The central airways are clear. LYMPH NODES: No mediastinal, hilar or axillary lymphadenopathy. LUNGS AND PLEURA: Multiple focal areas of airspace opacity throughout the bilateral lower lobes, slightly masslike in configuration. Findings are likely related to multifocal pneumonia. There is a new trace right pleural effusion. Moderate emphysema present. There are 2 new pulmonary nodules in the right upper lobe measuring up to 4 mm. There is a new ill-defined nodular density in the inferior left upper lobe measuring 8 mm (image 4/84). No pneumothorax. SOFT TISSUES/BONES: No acute abnormality of the bones or soft tissues. UPPER ABDOMEN: Limited images of the upper abdomen demonstrate peripunctate calcifications in the left kidney. IMPRESSION: 1. Findings most consistent with multifocal pneumonia the bilateral lower lobes. 2. New trace right pleural effusion. 3. Moderate emphysema, which is an independent risk factor for lung cancer; recommend consideration for evaluation for a low-dose CT lung cancer screening program. 4. Multiple pulmonary nodules measuring up to 8 mm; recommend non-contrast chest CT at 3-6 months, then repeat non-contrast chest CT at 18-24 months, as per Fleischner Society Guidelines. Electronically signed by: Greig Pique MD MD 12/25/2024 08:39 PM EST RP Workstation: [id number]   DG Chest 2 View Result Date: 12/24/2024 CLINICAL DATA:  Screening for TB EXAM: CHEST - 2 VIEW COMPARISON:  10/22/2024, chest CT 10/20/2024, 01/03/2023, 11/14/2019 FINDINGS:  Emphysema. No pleural effusion. Increased interstitial opacity within the left infrahilar lung with ill-defined right hilar/perihilar opacity. IMPRESSION: Emphysema. Increased interstitial opacity within the left infrahilar lung with ill-defined right hilar/perihilar opacity. Findings are indeterminate for acute on chronic bronchitic changes  or early pneumonia. Short-term radiographic follow-up suggested, alternatively chest CT could be obtained for further evaluation. Electronically Signed   By: Luke Bun M.D.   On: 12/24/2024 19:56         Laneta Blunt, DO [facility] 12/28/2024, 9:34 AM    Dictation software may have been used to generate the above note. Typos may occur and escape review in typed/dictated notes. Please contact Dr Blunt directly for clarity if needed.  Staff may message me via secure chat in Epic  but this may not receive an immediate response,  please page me for urgent matters!  If 7PM-7AM, please contact night coverage [email or web address]       "

## 2024-12-29 MED ORDER — IPRATROPIUM-ALBUTEROL 0.5-2.5 (3) MG/3ML IN SOLN
3.0000 mL | Freq: Four times a day (QID) | RESPIRATORY_TRACT | Status: DC
  Administered 2024-12-29 – 2024-12-30 (×2): 3 mL via RESPIRATORY_TRACT
  Filled 2024-12-29 (×2): qty 3

## 2024-12-29 NOTE — Plan of Care (Signed)
   Problem: Activity: Goal: Risk for activity intolerance will decrease Outcome: Progressing

## 2024-12-29 NOTE — TOC Progression Note (Signed)
 Transition of Care (TOC) - Progression Note    Patient Details  Name: Corey House. MRN: [id number] Date of Birth: [birth year]  Transition of Care [facility]) CM/SW Contact  Daved JONETTA Hamilton, RN Phone Number: 12/29/2024, 4:36 PM  Clinical Narrative:     Patient has upcoming appointment for PCP.   [facility] Phone: [phone] January 13, 2025 at 10:00am  This information has been added to the AVS  Expected Discharge Plan:  (patient is currently homeless, unsure of discharge location at this time) Barriers to Discharge: Financial Resources               Expected Discharge Plan and Services   Discharge Planning Services: CM Consult   Living arrangements for the past 2 months: Homeless Expected Discharge Date: 11/03/24                                     Social Drivers of Health (SDOH) Interventions SDOH Screenings   Food Insecurity: Food Insecurity Present (10/20/2024)  Housing: High Risk (10/21/2024)  Transportation Needs: Unmet Transportation Needs (10/20/2024)  Utilities: At Risk (10/20/2024)  Alcohol  Screen: Low Risk (04/23/2024)  Social Connections: Unknown (04/23/2024)  Tobacco Use: High Risk (10/21/2024)    Readmission Risk Interventions     No data to display

## 2024-12-29 NOTE — Progress Notes (Signed)
 SATURATION QUALIFICATIONS: (This note is used to comply with regulatory documentation for home oxygen)   Patient Saturations on Room Air at Rest = 89%   Patient Saturations on Room Air while Ambulating = 84%   Patient Saturations on 2L Liters of oxygen while Ambulating = 91%

## 2024-12-29 NOTE — Plan of Care (Signed)

## 2024-12-29 NOTE — TOC Progression Note (Addendum)
 Transition of Care (TOC) - Progression Note    Patient Details  Name: Corey House. MRN: [id number] Date of Birth: [birth year]  Transition of Care [facility]) CM/SW Contact  Daved JONETTA Hamilton, RN Phone Number: 12/29/2024, 9:39 AM  Clinical Narrative:     Spoke with Zelvia, [facility] contact, she verbalized that the patient is in the final approval process for state funds and they anticipate final decision within a couple of days. She verbalized they sent an e-mail this morning to the state requesting an update as the final documentation was sent to them on Friday 12/25/24.   UPDATE:  Received call from Zelvia with [employer], she advised that patient state funds have been approved and we are now awaiting prior authorization approval.    Expected Discharge Plan:  (patient is currently homeless, unsure of discharge location at this time) Barriers to Discharge: Financial Resources               Expected Discharge Plan and Services   Discharge Planning Services: CM Consult   Living arrangements for the past 2 months: Homeless Expected Discharge Date: 11/03/24                                     Social Drivers of Health (SDOH) Interventions SDOH Screenings   Food Insecurity: Food Insecurity Present (10/20/2024)  Housing: High Risk (10/21/2024)  Transportation Needs: Unmet Transportation Needs (10/20/2024)  Utilities: At Risk (10/20/2024)  Alcohol  Screen: Low Risk (04/23/2024)  Social Connections: Unknown (04/23/2024)  Tobacco Use: High Risk (10/21/2024)    Readmission Risk Interventions     No data to display

## 2024-12-29 NOTE — Progress Notes (Signed)
 " PROGRESS NOTE    [name] [name].   FMW:[id number] DOB: May 12, 1966  DOA: 10/20/2024 Date of Service: 12/29/2024 which is hospital day 70  PCP: Patient, [name]    Hospital course / significant events:   59 y.o. year old male with past medical history of  tobacco use disorder reporting a 41 year pack per year history, smoking ~1 pack per day since he was 59 years old. He denies any additional medical history though he has not seen a medical provider in ~20 years. He presents to [facility] ED with shortness of breath that began ~ 9 months ago and worsened ~3 months ago. He reports today his dyspnea is not significantly different than it was 3 months ago. He endorses a chronic cough that has been present since childhood. Her ports the cough is currently, minimally productive with white phlegm. Additionally, he endorses bilateral lower extremity swelling that began in the last 2 days, per his report (R) > (L). On exam the swelling is equal bilaterally. He endorses fatigue and dyspnea that is worse with position changes such as bending over and when standing from lying. He denies chest pain, palpitations, or fever.   ED Course: On arrival to [facility] ED patient was noted to be afebrile temp 36.8C, BP 142/111, HR 88, RR 22, SPO2 100% on 15L non-rebreather. Patient now on 2L via nasal cannula with SPO2 96%. CXR obtained and shows non specific findings of increased interstitial markings. CTA PE obtained and shows no PE, dilated RV and reflux of contract into the IVC and hepatic vein consistent with (R) heart failure, chronic bronchitis, and emphysema. US  DVT (B)LE negative for DVT. Labs notable Negative COVID, flu, RSV,for BNP 284.2, BUN 24, troponin 41--> 38 with no report of chest pain.  Lactic acid 0.9.  Blood cultures drawn and in process.  He was given Lasix , DuoNebs, and Solu-Medrol .    Patient was treated in the hospital, condition has improved. See A/P belwo for problem based  course. Currently just pending for placement.  01/09 CXR for TB r/o abn, --> CT chest concern for pneumonia, 01/10 pt reports no increased SOB or coughing but now that he considers it, he has been a lot more fatigued past few days. Started augmentin  + azithro, advised increased IS use and increased ambulation to encourage ventilation - CT images reviewed, PNA areas of concern lower posterior lungs question persistent atelectasis / hypoventilation increasing risk for infection.   01/10: PPD negative   01/12: pt feeling bad reporting more SOB trouble getting deep breath in, fatigue, jittery when xanax  is wearing off. 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 Flu/COVID swab   01/13: pt reports improvement - pending placement, continue IV abx for now, see TOC note     Consultants:  Psychiatry Pulmonology   Procedures/Surgeries: none      ASSESSMENT & PLAN:   Acute on chronic respiratory failure with hypoxia and hypercapnia  Multiple attempts were made to wean the patient from oxygen during the hospital.  Pulse ox drops with ambulation on room air (down to 85% last week).  Patient unable to tolerate BiPAP.  Currently on 2 L. Mr Brannock requires volume targeted ventilation via NIV/HMV for nocturnal and daytime use for CRF secondary to  COPD. Alarms and internal battery are required to ensure continuous support and immediate recognition of ventilatory insufficiency. A RAD with backup rate is insufficient due to the severity of illness. Volume ventilation is needed to reduce work of breathing and fatigue and to prevent life threatening exacerbations. OSA is not the predominant cause of hypercapnia. PCO2 of 62 was confirmed by ABG.     Pneumonia 12/26/2024  Started augmentin  + azithro, advised increased IS use and increased ambulation to encourage ventilation - CT images reviewed, PNA areas of concern lower posterior lungs question persistent atelectasis / hypoventilation increasing risk for infection.  01/12: pt feeling bad reporting more SOB trouble getting deep breath in, fatigue, jittery when xanax  is wearing off. 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 Neg COVID/Flu/RSV resp panel   Nausea Improved.  Patient thinks it secondary to the Wellbutrin .  This medication was discontinued on 1/5. Contineu Zofran  as needed    Depression Increased Seroquel  to 50 mg at night on 12/18/2024.  On Xanax  and Lexapro .  Added Wellbutrin  on 12/22.  Patient asked to come off the Wellbutrin  on 1/5 secondary to feeling nauseous.  Wellbutrin  discontinued. Sent w/ Rx for xanax  bid prn #60 for 30 days, any changes to this will be per PCP, try to use sparingly / wean off to avoid dependence - this was reviewed w/ patient. Later switched to Clonazepam  instead of alprazolam     COPD with acute exacerbation  Treated during the hospital course, continue inhalers scheduled + prn    Acute on chronic diastolic CHF (congestive heart failure)  EF 55 to 60%.  Off Lasix  at this time and stable, added Lasix  prn LE edema, orthopnea, weight gain 5+ lbs over 1-2 days   Tobacco use disorder On NicoDerm patch scheduled and gum prn    Increased anion gap metabolic acidosis Resolved   Hypernatremia Resolved   Thrombocytosis Continue to monitor intermittently CBC   Protein-calorie malnutrition, severe Advise balanced diet, increased protein intake   PPD test Placed 12/24/2024  Needs read in 48h  or can check CXR  Abn CXR  CT to eval pending results      No concerns based on BMI: Body mass index is 23.19 kg/m.SABRA Significantly low or high BMI is associated with higher medical risk.  Underweight - under 18  overweight - 25 to 29 obese - 30 or more Class 1 obesity: BMI of 30.0 to 34 Class 2 obesity: BMI of 35.0 to 39 Class 3 obesity: BMI of 40.0 to 49 Super Morbid Obesity: BMI 50-59 Super-super Morbid Obesity: BMI 60+ Healthy nutrition and physical activity advised as adjunct to other disease management and risk reduction treatments    DVT prophylaxis: xarelto  IV fluids: no continuous IV fluids  Nutrition: low salt Central lines / other devices: none  Code Status: FULL CODE ACP documentation reviewed:  none on file in [facility]  TOC needs: placement Medical barriers to dispo: pneumonia whic is improving, should be medically stable by the time placement is confirmed .             Subjective / Brief ROS:  Patient feeling some better today Reporrts breathing a bit easier Still fatigued    Family Communication: none    Objective Findings:  Vitals:   12/29/24 0500 12/29/24  0515 12/29/24 0748 12/29/24 0829  BP:  109/88  109/70  Pulse:  66  80  Resp:  18  18  Temp:  98.6 F (37 C)    TempSrc:  Oral    SpO2:  97% 94% 96%  Weight: 61.9 kg     Height:        Intake/Output Summary (Last 24 hours) at 12/29/2024 1319 Last data filed at 12/28/2024 1900 Gross per 24 hour  Intake 1054.47 ml  Output --  Net 1054.47 ml    Filed Weights   12/27/24 0500 12/28/24 0500 12/29/24 0500  Weight: 61.9 kg 61.9 kg 61.9 kg    Examination:  Physical Exam Constitutional:      General: He is not in acute distress.    Appearance: He is not ill-appearing.  Cardiovascular:     Rate and Rhythm: Normal rate and regular rhythm.  Pulmonary:     Effort: Pulmonary effort is normal.     Breath sounds: Decreased breath sounds present. Rales: c/w  atelectasis. Musculoskeletal:     Right lower leg: No edema.     Left lower leg: No edema.  Neurological:     Mental Status: He is alert and oriented to person, place, and time.  Psychiatric:        Mood and Affect: Mood is not anxious.          Scheduled Medications:   azithromycin   500 mg Oral Daily   escitalopram   20 mg Oral Daily   guaiFENesin   600 mg Oral BID   ipratropium-albuterol   3 mL Nebulization Q6H WA   multivitamin with minerals  1 tablet Oral Daily   nicotine   14 mg Transdermal Daily   pantoprazole   40 mg Oral Daily   QUEtiapine   50 mg Oral QHS   rivaroxaban   10 mg Oral Q supper    Continuous Infusions:  cefTRIAXone  (ROCEPHIN )  IV 2 g (12/29/24 1052)    PRN Medications:  acetaminophen , acetaminophen -caffeine , albuterol , artificial tears, clonazePAM , loratadine , melatonin, nicotine  polacrilex, ondansetron , mouth rinse, sodium chloride   Antimicrobials from admission:  Anti-infectives (From admission, onward)    Start     Dose/Rate Route Frequency Ordered Stop   12/28/24 1100  cefTRIAXone  (ROCEPHIN ) 2 g in sodium chloride  0.9 % 100 mL IVPB        2 g 200 mL/hr over 30 Minutes Intravenous Every 24 hours 12/28/24 0930 01/02/25 1059   12/26/24 1130  azithromycin  (ZITHROMAX ) tablet 500 mg        500 mg Oral Daily 12/26/24 1030 12/31/24 0959   12/26/24 1130  amoxicillin -clavulanate (AUGMENTIN ) 875-125 MG per tablet 1 tablet  Status:  Discontinued        1 tablet Oral Every 12 hours 12/26/24 1030 12/28/24 0930   12/03/24 1300  doxycycline  (VIBRA -TABS) tablet 100 mg        100 mg Oral Every 12 hours 12/03/24 1212 12/09/24 2108   10/21/24 1445  azithromycin  (ZITHROMAX ) tablet 250 mg  Status:  Discontinued        250 mg Oral Daily 10/21/24 1351 11/11/24 1809           Data Reviewed:  I have personally reviewed the following...  CBC: Recent Labs  Lab 12/28/24 1239  WBC 9.0  NEUTROABS 7.5  HGB 14.1  HCT 42.4  MCV 89.6  PLT 440*    Basic  Metabolic Panel: Recent Labs  Lab 12/28/24 1239  NA 142  K 4.2  CL 103  CO2 28  GLUCOSE  103*  BUN 12  CREATININE 0.69  CALCIUM 9.3    GFR: Estimated Creatinine Clearance: 85.3 mL/min (by C-G formula based on SCr of 0.69 mg/dL). Liver Function Tests: No results for input(s): AST, ALT, ALKPHOS, BILITOT, PROT, ALBUMIN in the last 168 hours. No results for input(s): LIPASE, AMYLASE in the last 168 hours. No results for input(s): AMMONIA in the last 168 hours. Coagulation Profile: No results for input(s): INR, PROTIME in the last 168 hours. Cardiac Enzymes: No results for input(s): CKTOTAL, CKMB, CKMBINDEX, TROPONINI in the last 168 hours. BNP (last 3 results) Recent Labs    10/29/24 0608  PROBNP <50.0   HbA1C: No results for input(s): HGBA1C in the last 72 hours. CBG: No results for input(s): GLUCAP in the last 168 hours. Lipid Profile: No results for input(s): CHOL, HDL, LDLCALC, TRIG, CHOLHDL, LDLDIRECT in the last 72 hours. Thyroid Function Tests: No results for input(s): TSH, T4TOTAL, FREET4, T3FREE, THYROIDAB in the last 72 hours. Anemia Panel: No results for input(s): VITAMINB12, FOLATE, FERRITIN, TIBC, IRON, RETICCTPCT in the last 72 hours. Most Recent Urinalysis On File:     Component Value Date/Time   COLORURINE YELLOW (A) 01/03/2023 2105   APPEARANCEUR CLEAR (A) 01/03/2023 2105   LABSPEC 1.025 01/03/2023 2105   PHURINE 5.0 01/03/2023 2105   GLUCOSEU NEGATIVE 01/03/2023 2105   HGBUR NEGATIVE 01/03/2023 2105   BILIRUBINUR NEGATIVE 01/03/2023 2105   KETONESUR 20 (A) 01/03/2023 2105   PROTEINUR 30 (A) 01/03/2023 2105   NITRITE NEGATIVE 01/03/2023 2105   LEUKOCYTESUR NEGATIVE 01/03/2023 2105   Sepsis Labs: @LABRCNTIP (procalcitonin:4,lacticidven:4) Microbiology: Recent Results (from the past 240 hours)  Resp panel by RT-PCR (RSV, Flu A&B, Covid) Anterior Nasal Swab     Status: None    Collection Time: 12/28/24  9:28 AM   Specimen: Anterior Nasal Swab  Result Value Ref Range Status   SARS Coronavirus 2 by RT PCR NEGATIVE NEGATIVE Final    Comment: (NOTE) SARS-CoV-2 target nucleic acids are NOT DETECTED.  The SARS-CoV-2 RNA is generally detectable in upper respiratory specimens during the acute phase of infection. The lowest concentration of SARS-CoV-2 viral copies this assay can detect is 138 copies/mL. A negative result does not preclude SARS-Cov-2 infection and should not be used as the sole basis for treatment or other patient management decisions. A negative result may occur with  improper specimen collection/handling, submission of specimen other than nasopharyngeal swab, presence of viral mutation(s) within the areas targeted by this assay, and inadequate number of viral copies(<138 copies/mL). A negative result must be combined with clinical observations, patient history, and epidemiological information. The expected result is Negative.  Fact Sheet for Patients:  bloggercourse.com  Fact Sheet for Healthcare Providers:  seriousbroker.it  This test is no t yet approved or cleared by the [place]  FDA and  has been authorized for detection and/or diagnosis of SARS-CoV-2 by FDA under an Emergency Use Authorization (EUA). This EUA will remain  in effect (meaning this test can be used) for the duration of the COVID-19 declaration under Section 564(b)(1) of the Act, 21 U.S.C.section 360bbb-3(b)(1), unless the authorization is terminated  or revoked sooner.       Influenza A by PCR NEGATIVE NEGATIVE Final   Influenza B by PCR NEGATIVE NEGATIVE Final    Comment: (NOTE) The Xpert Xpress SARS-CoV-2/FLU/RSV plus assay is intended as an aid in the diagnosis of influenza from Nasopharyngeal swab specimens and should not be used as a sole basis for treatment. Nasal washings and aspirates are  unacceptable for  Xpert Xpress SARS-CoV-2/FLU/RSV testing.  Fact Sheet for Patients: bloggercourse.com  Fact Sheet for Healthcare Providers: seriousbroker.it  This test is not yet approved or cleared by the [place]  FDA and has been authorized for detection and/or diagnosis of SARS-CoV-2 by FDA under an Emergency Use Authorization (EUA). This EUA will remain in effect (meaning this test can be used) for the duration of the COVID-19 declaration under Section 564(b)(1) of the Act, 21 U.S.C. section 360bbb-3(b)(1), unless the authorization is terminated or revoked.     Resp Syncytial Virus by PCR NEGATIVE NEGATIVE Final    Comment: (NOTE) Fact Sheet for Patients: bloggercourse.com  Fact Sheet for Healthcare Providers: seriousbroker.it  This test is not yet approved or cleared by the [place]  FDA and has been authorized for detection and/or diagnosis of SARS-CoV-2 by FDA under an Emergency Use Authorization (EUA). This EUA will remain in effect (meaning this test can be used) for the duration of the COVID-19 declaration under Section 564(b)(1) of the Act, 21 U.S.C. section 360bbb-3(b)(1), unless the authorization is terminated or revoked.  Performed at [facility], [address], [place], [place] [address]       Radiology Studies last 3 days: CT CHEST WO CONTRAST Result Date: 12/25/2024 EXAM: CT CHEST WITHOUT CONTRAST 12/25/2024 01:23:18 PM TECHNIQUE: CT of the chest was performed without the administration of intravenous contrast. Multiplanar reformatted images are provided for review. Automated exposure control, iterative reconstruction, and/or weight based adjustment of the mA/kV was utilized to reduce the radiation dose to as low as reasonably achievable. COMPARISON: CT chest 10/20/2024. CLINICAL HISTORY: Pneumonia, complication suspected, xray done. FINDINGS: MEDIASTINUM:  Heart and pericardium are unremarkable. The central airways are clear. LYMPH NODES: No mediastinal, hilar or axillary lymphadenopathy. LUNGS AND PLEURA: Multiple focal areas of airspace opacity throughout the bilateral lower lobes, slightly masslike in configuration. Findings are likely related to multifocal pneumonia. There is a new trace right pleural effusion. Moderate emphysema present. There are 2 new pulmonary nodules in the right upper lobe measuring up to 4 mm. There is a new ill-defined nodular density in the inferior left upper lobe measuring 8 mm (image 4/84). No pneumothorax. SOFT TISSUES/BONES: No acute abnormality of the bones or soft tissues. UPPER ABDOMEN: Limited images of the upper abdomen demonstrate peripunctate calcifications in the left kidney. IMPRESSION: 1. Findings most consistent with multifocal pneumonia the bilateral lower lobes. 2. New trace right pleural effusion. 3. Moderate emphysema, which is an independent risk factor for lung cancer; recommend consideration for evaluation for a low-dose CT lung cancer screening program. 4. Multiple pulmonary nodules measuring up to 8 mm; recommend non-contrast chest CT at 3-6 months, then repeat non-contrast chest CT at 18-24 months, as per Fleischner Society Guidelines. Electronically signed by: Greig Pique MD MD 12/25/2024 08:39 PM EST RP Workstation: [id number]         Laneta Blunt, DO [facility] 12/29/2024, 1:19 PM    Dictation software may have been used to generate the above note. Typos may occur and escape review in typed/dictated notes. Please contact Dr Blunt directly for clarity if needed.  Staff may message me via secure chat in Epic  but this may not receive an immediate response,  please page me for urgent matters!  If 7PM-7AM, please contact night coverage [email or web address]       "

## 2024-12-29 NOTE — Plan of Care (Signed)
   Problem: Education: Goal: Knowledge of General Education information will improve Description Including pain rating scale, medication(s)/side effects and non-pharmacologic comfort measures Outcome: Progressing

## 2024-12-30 DIAGNOSIS — I5033 Acute on chronic diastolic (congestive) heart failure: Secondary | ICD-10-CM | POA: Diagnosis not present

## 2024-12-30 DIAGNOSIS — E8729 Other acidosis: Secondary | ICD-10-CM | POA: Diagnosis not present

## 2024-12-30 DIAGNOSIS — J441 Chronic obstructive pulmonary disease with (acute) exacerbation: Secondary | ICD-10-CM | POA: Diagnosis not present

## 2024-12-30 DIAGNOSIS — E870 Hyperosmolality and hypernatremia: Secondary | ICD-10-CM | POA: Diagnosis not present

## 2024-12-30 DIAGNOSIS — F17200 Nicotine dependence, unspecified, uncomplicated: Secondary | ICD-10-CM | POA: Diagnosis not present

## 2024-12-30 DIAGNOSIS — R918 Other nonspecific abnormal finding of lung field: Secondary | ICD-10-CM | POA: Diagnosis not present

## 2024-12-30 DIAGNOSIS — J188 Other pneumonia, unspecified organism: Secondary | ICD-10-CM | POA: Diagnosis not present

## 2024-12-30 DIAGNOSIS — F3289 Other specified depressive episodes: Secondary | ICD-10-CM | POA: Diagnosis not present

## 2024-12-30 DIAGNOSIS — E43 Unspecified severe protein-calorie malnutrition: Secondary | ICD-10-CM | POA: Diagnosis not present

## 2024-12-30 DIAGNOSIS — R110 Nausea: Secondary | ICD-10-CM | POA: Diagnosis not present

## 2024-12-30 DIAGNOSIS — D75839 Thrombocytosis, unspecified: Secondary | ICD-10-CM | POA: Diagnosis not present

## 2024-12-30 DIAGNOSIS — J9601 Acute respiratory failure with hypoxia: Secondary | ICD-10-CM | POA: Diagnosis not present

## 2024-12-30 MED ORDER — IPRATROPIUM-ALBUTEROL 0.5-2.5 (3) MG/3ML IN SOLN
3.0000 mL | Freq: Two times a day (BID) | RESPIRATORY_TRACT | Status: AC
  Administered 2024-12-30: 3 mL via RESPIRATORY_TRACT

## 2024-12-30 NOTE — Progress Notes (Signed)
 " Progress Note   Patient: Corey House. FMW:[id number] DOB: 1966-02-15 DOA: 10/20/2024     71 DOS: the patient was seen and examined on 12/30/2024   Brief hospital course: Hospital course / significant events:   59 y.o. year old male with past medical history of  tobacco use disorder reporting a 41 year pack per year history, smoking ~1 pack per day since he was 59 years old. He denies any additional medical history though he has not seen a medical provider in ~20 years. He presents to [facility] ED with shortness of breath that began ~ 9 months ago and worsened ~3 months ago. He reports today his dyspnea is not significantly different than it was 3 months ago. He endorses a chronic cough that has been present since childhood. Her ports the cough is currently, minimally productive with white phlegm. Additionally, he endorses bilateral lower extremity swelling that began in the last 2 days, per his report (R) > (L). On exam the swelling is equal bilaterally. He endorses fatigue and dyspnea that is worse with position changes such as bending over and when standing from lying. He denies chest pain, palpitations, or fever.   ED Course: On arrival to [facility] ED patient was noted to be afebrile temp 36.8C, BP 142/111, HR 88, RR 22, SPO2 100% on 15L non-rebreather. Patient now on 2L via nasal cannula with SPO2 96%. CXR obtained and shows non specific findings of increased interstitial markings. CTA PE obtained and shows no PE, dilated RV and reflux of contract into the IVC and hepatic vein consistent with (R) heart failure, chronic bronchitis, and emphysema. US  DVT (B)LE negative for DVT. Labs notable Negative COVID, flu, RSV,for BNP 284.2, BUN 24, troponin 41--> 38 with no report of chest pain.  Lactic acid 0.9.  Blood cultures drawn and in process.  He was given Lasix , DuoNebs, and Solu-Medrol .    Patient was treated in the hospital, condition has improved. See A/P belwo for problem  based course. Currently just pending for placement.  01/09 CXR for TB r/o abn, --> CT chest concern for pneumonia, 01/10 pt reports no increased SOB or coughing but now that he considers it, he has been a lot more fatigued past few days. Started augmentin  + azithro, advised increased IS use and increased ambulation to encourage ventilation - CT images reviewed, PNA areas of concern lower posterior lungs question persistent atelectasis / hypoventilation increasing risk for infection.   01/10: PPD negative   01/12: pt feeling bad reporting more SOB trouble getting deep breath in, fatigue, jittery when xanax  is wearing off. 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 Flu/COVID swab   01/13: pt reports improvement - pending placement, continue IV abx for now, see TOC note   Consultants:  Psychiatry Pulmonology       Assessment and Plan: * Multifocal pneumonia Currently on high-dose Rocephin .  Completed Zithromax .   Acute on chronic respiratory failure with hypoxia and hypercapnia (HCC) Multiple attempts were made to wean the patient from oxygen during the hospital.  Pulse ox drops with ambulation on room air (down to 85%).  Patient unable to tolerate BiPAP.  Currently on 2 L.  Patient can protect his airway and clear secretions without any issues.  Nausea Improved.  Patient thinks it secondary to the Wellbutrin .  This medication was discontinued on 1/5.  Depression Increased Seroquel  to 50 mg at night on 12/18/2024.  On Xanax  and Lexapro .  Added Wellbutrin  on 12/22.  Patient asked to come off the Wellbutrin  on 1/5 secondary to feeling nauseous.  Wellbutrin  discontinued.  COPD with acute exacerbation (HCC) Treated during the  hospital course  Acute on chronic diastolic CHF (congestive heart failure) (HCC) EF 55 to 60%.  Off Lasix  at this time and stable  Tobacco use disorder On NicoDerm patch  Headache Improved.  Tylenol  as needed.  IV magnesium  given on 11/5.  Increased anion gap metabolic acidosis Resolved  Hypernatremia Resolved  Thrombocytosis Continue to watch intermittently  Protein-calorie malnutrition, severe Continue supplements  Pulmonary nodules seen on CT scan.  Will need outpatient follow-up.      Subjective: Patient has a little cough.  Recently started back on antibiotics for pneumonia.  Transitional care team working on placement.  Admitted 71 days ago with COPD exacerbation.  Physical Exam: Vitals:   12/29/24 2000 12/30/24 0500 12/30/24 0557 12/30/24 0733  BP: 104/80  109/83   Pulse: 72  66   Resp: 18  18   Temp: 97.8 F (36.6 C)  98.2 F (36.8 C)   TempSrc: Oral  Oral   SpO2: 95%  96% 96%  Weight:  63.1 kg    Height:       Physical Exam HENT:     Head: Normocephalic.  Cardiovascular:     Rate and Rhythm: Normal rate and regular rhythm.     Heart sounds: Normal heart sounds, S1 normal and S2 normal.  Pulmonary:     Breath sounds: Examination of the right-lower field reveals decreased breath sounds. Examination of the left-lower field reveals decreased breath sounds. Decreased breath sounds present. No wheezing, rhonchi or rales.  Abdominal:     Palpations: Abdomen is soft.     Tenderness: There is no abdominal tenderness.  Musculoskeletal:     Right lower leg: No swelling.     Left lower leg: No swelling.  Skin:    General: Skin is warm.  Neurological:     Mental Status: He is alert.     Data Reviewed: Last creatinine 0.69 last hemoglobin 14.1, platelet count 440 Recent flu RSV and influenza negative CT scan of the chest on 1/9 showed multiple focal pneumonia in bilateral lower lobes, moderate emphysema and multiple pulmonary  nodules  Disposition: Status is: Inpatient Remains inpatient appropriate because: Unfortunately patient will need placement.  He is homeless and requires 24/7 oxygen  Planned Discharge Destination: Will need placement    Time spent: 28 minutes  Author: Charlie Patterson, MD 12/30/2024 12:38 PM  For on call review [email or web address].  "

## 2024-12-30 NOTE — Assessment & Plan Note (Addendum)
 Will need to follow-up as outpatient.  Pulmonary referral placed.

## 2024-12-30 NOTE — TOC CM/SW Note (Signed)
 SATURATION QUALIFICATIONS: (This note is used to comply with regulatory documentation for home oxygen)   Patient Saturations on Room Air at Rest = 89%   Patient Saturations on Room Air while Ambulating = 84%   Patient Saturations on 2L Liters of oxygen while Ambulating = 91%

## 2024-12-30 NOTE — Plan of Care (Signed)
  Problem: Education: Goal: Knowledge of General Education information will improve Description: Including pain rating scale, medication(s)/side effects and non-pharmacologic comfort measures Outcome: Progressing   Problem: Health Behavior/Discharge Planning: Goal: Ability to manage health-related needs will improve Outcome: Progressing   Problem: Clinical Measurements: Goal: Ability to maintain clinical measurements within normal limits will improve Outcome: Progressing Goal: Will remain free from infection Outcome: Progressing Goal: Diagnostic test results will improve Outcome: Progressing Goal: Respiratory complications will improve Outcome: Progressing Goal: Cardiovascular complication will be avoided Outcome: Progressing   Problem: Activity: Goal: Risk for activity intolerance will decrease Outcome: Progressing   Problem: Nutrition: Goal: Adequate nutrition will be maintained Outcome: Progressing   Problem: Coping: Goal: Level of anxiety will decrease Outcome: Progressing   Problem: Coping: Goal: Level of anxiety will decrease Outcome: Progressing   

## 2024-12-30 NOTE — Assessment & Plan Note (Addendum)
 Resolved.  Tylenol  as needed.  IV magnesium  given on 11/5.

## 2024-12-30 NOTE — Assessment & Plan Note (Addendum)
 Currently on high-dose Rocephin .  Completed Zithromax .

## 2024-12-30 NOTE — Progress Notes (Signed)
 Mobility Specialist - Progress Note    12/30/24 1400  Mobility  Activity Ambulated independently;Dangled on edge of bed  Level of Assistance Independent  Assistive Device  (IV Stand)  Distance Ambulated (ft) 10 ft  Range of Motion/Exercises Active  Activity Response Tolerated well  Mobility visit 1 Mobility  Mobility Specialist Start Time (ACUTE ONLY) 1405  Mobility Specialist Stop Time (ACUTE ONLY) 1408  Mobility Specialist Time Calculation (min) (ACUTE ONLY) 3 min   Pt was supine in bed with the HOB elevated on O2 @ 2 L. Pt is able today to get to the EOB independently. Pt is able to STS independently while attached to IV Stand. Pt ambulated well. Pt after activity is back in bed with need in reach  [facility] Mobility Specialist 12/30/2024, 2:17 PM

## 2024-12-31 DIAGNOSIS — R110 Nausea: Secondary | ICD-10-CM | POA: Diagnosis not present

## 2024-12-31 DIAGNOSIS — F3289 Other specified depressive episodes: Secondary | ICD-10-CM | POA: Diagnosis not present

## 2024-12-31 DIAGNOSIS — J9601 Acute respiratory failure with hypoxia: Secondary | ICD-10-CM | POA: Diagnosis not present

## 2024-12-31 DIAGNOSIS — J188 Other pneumonia, unspecified organism: Secondary | ICD-10-CM | POA: Diagnosis not present

## 2024-12-31 NOTE — Progress Notes (Signed)
 " Progress Note   Patient: Corey House. FMW:[id number] DOB: 1966/10/04 DOA: 10/20/2024     72 DOS: the patient was seen and examined on 12/31/2024   Brief hospital course: Hospital course / significant events:   59 y.o. year old male with past medical history of  tobacco use disorder reporting a 41 year pack per year history, smoking ~1 pack per day since he was 59 years old. He denies any additional medical history though he has not seen a medical provider in ~20 years. He presents to [facility] ED with shortness of breath that began ~ 9 months ago and worsened ~3 months ago. He reports today his dyspnea is not significantly different than it was 3 months ago. He endorses a chronic cough that has been present since childhood. Her ports the cough is currently, minimally productive with white phlegm. Additionally, he endorses bilateral lower extremity swelling that began in the last 2 days, per his report (R) > (L). On exam the swelling is equal bilaterally. He endorses fatigue and dyspnea that is worse with position changes such as bending over and when standing from lying. He denies chest pain, palpitations, or fever.   ED Course: On arrival to [facility] ED patient was noted to be afebrile temp 36.8C, BP 142/111, HR 88, RR 22, SPO2 100% on 15L non-rebreather. Patient now on 2L via nasal cannula with SPO2 96%. CXR obtained and shows non specific findings of increased interstitial markings. CTA PE obtained and shows no PE, dilated RV and reflux of contract into the IVC and hepatic vein consistent with (R) heart failure, chronic bronchitis, and emphysema. US  DVT (B)LE negative for DVT. Labs notable Negative COVID, flu, RSV,for BNP 284.2, BUN 24, troponin 41--> 38 with no report of chest pain.  Lactic acid 0.9.  Blood cultures drawn and in process.  He was given Lasix , DuoNebs, and Solu-Medrol .    Patient was treated in the hospital, condition has improved. See A/P belwo for problem  based course. Currently just pending for placement.  01/09 CXR for TB r/o abn, --> CT chest concern for pneumonia, 01/10 pt reports no increased SOB or coughing but now that he considers it, he has been a lot more fatigued past few days. Started augmentin  + azithro, advised increased IS use and increased ambulation to encourage ventilation - CT images reviewed, PNA areas of concern lower posterior lungs question persistent atelectasis / hypoventilation increasing risk for infection.   01/10: PPD negative   01/12: pt feeling bad reporting more SOB trouble getting deep breath in, fatigue, jittery when xanax  is wearing off. 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 Flu/COVID swab   01/13: pt reports improvement - pending placement, continue IV abx for now, see TOC note  1/15.  TOC hopeful we can arrange placement for the patient soon   Consultants:  Psychiatry Pulmonology       Assessment and Plan: * Multifocal pneumonia Currently on high-dose Rocephin .  Completed Zithromax .  Acute on chronic respiratory failure with hypoxia and hypercapnia (HCC) Multiple attempts were made to wean the patient from oxygen during the hospital.  Pulse ox drops with ambulation on room air (down to 84% on 1/13).  Patient unable to tolerate BiPAP.  Currently on 2 L.  Patient  can protect his airway and clear secretions without any issues.  Nausea Improved.  Patient thinks it secondary to the Wellbutrin .  This medication was discontinued on 1/5.  Depression Increased Seroquel  to 50 mg at night on 12/18/2024.  On Xanax  and Lexapro .  Added Wellbutrin  on 12/22.  Patient asked to come off the Wellbutrin  on 1/5 secondary to feeling nauseous.  Wellbutrin   discontinued.  COPD with acute exacerbation (HCC) Treated during the hospital course  Acute on chronic diastolic CHF (congestive heart failure) (HCC) EF 55 to 60%.  Off Lasix  at this time and stable  Tobacco use disorder On NicoDerm patch  Headache Improved.  Tylenol  as needed.  IV magnesium  given on 11/5.  Increased anion gap metabolic acidosis Resolved  Hypernatremia Resolved  Pulmonary nodules Will need to follow-up as outpatient.  Thrombocytosis Continue to watch intermittently  Protein-calorie malnutrition, severe Continue supplements        Subjective: Patient feels okay.  Hopeful to get out of the hospital soon.  Still with a little cough.  Admitted 72 days ago with acute respiratory failure  Physical Exam: Vitals:   12/30/24 2154 12/31/24 0331 12/31/24 0500 12/31/24 0909  BP:  108/71  108/78  Pulse:  87  78  Resp:  16  18  Temp:  97.6 F (36.4 C)  (!) 97.5 F (36.4 C)  TempSrc:      SpO2: 97% 97%  94%  Weight:   63.5 kg   Height:       Physical Exam HENT:     Head: Normocephalic.  Cardiovascular:     Rate and Rhythm: Normal rate and regular rhythm.     Heart sounds: Normal heart sounds, S1 normal and S2 normal.  Pulmonary:     Breath sounds: Examination of the right-lower field reveals decreased breath sounds. Examination of the left-lower field reveals decreased breath sounds. Decreased breath sounds present. No wheezing, rhonchi or rales.  Abdominal:     Palpations: Abdomen is soft.     Tenderness: There is no abdominal tenderness.  Musculoskeletal:     Right lower leg: No swelling.     Left lower leg: No swelling.  Skin:    General: Skin is warm.  Neurological:     Mental Status: He is alert.     Data Reviewed: No new data  Disposition: Status is: Inpatient Remains inpatient appropriate because: TOC hopeful that we should be able to get the patient out of the hospital soon.  (Patient homeless and requires 24/7 oxygen so he will  need placement)  Planned Discharge Destination: Placement    Time spent: 27 minutes  Author: Charlie Patterson, MD 12/31/2024 3:09 PM  For on call review [email or web address].  "

## 2024-12-31 NOTE — Plan of Care (Signed)

## 2025-01-01 DIAGNOSIS — F17200 Nicotine dependence, unspecified, uncomplicated: Secondary | ICD-10-CM | POA: Diagnosis not present

## 2025-01-01 DIAGNOSIS — E870 Hyperosmolality and hypernatremia: Secondary | ICD-10-CM | POA: Diagnosis not present

## 2025-01-01 DIAGNOSIS — R110 Nausea: Secondary | ICD-10-CM | POA: Diagnosis not present

## 2025-01-01 DIAGNOSIS — J9621 Acute and chronic respiratory failure with hypoxia: Secondary | ICD-10-CM | POA: Diagnosis not present

## 2025-01-01 DIAGNOSIS — J441 Chronic obstructive pulmonary disease with (acute) exacerbation: Secondary | ICD-10-CM | POA: Diagnosis not present

## 2025-01-01 DIAGNOSIS — R918 Other nonspecific abnormal finding of lung field: Secondary | ICD-10-CM | POA: Diagnosis not present

## 2025-01-01 DIAGNOSIS — E43 Unspecified severe protein-calorie malnutrition: Secondary | ICD-10-CM | POA: Diagnosis not present

## 2025-01-01 DIAGNOSIS — F3289 Other specified depressive episodes: Secondary | ICD-10-CM | POA: Diagnosis not present

## 2025-01-01 DIAGNOSIS — J188 Other pneumonia, unspecified organism: Secondary | ICD-10-CM | POA: Diagnosis not present

## 2025-01-01 DIAGNOSIS — D75839 Thrombocytosis, unspecified: Secondary | ICD-10-CM | POA: Diagnosis not present

## 2025-01-01 DIAGNOSIS — E8729 Other acidosis: Secondary | ICD-10-CM | POA: Diagnosis not present

## 2025-01-01 DIAGNOSIS — I5033 Acute on chronic diastolic (congestive) heart failure: Secondary | ICD-10-CM | POA: Diagnosis not present

## 2025-01-01 NOTE — TOC Progression Note (Signed)
 Transition of Care (TOC) - Progression Note    Patient Details  Name: Corey House. MRN: [id number] Date of Birth: 05/02/1966  Transition of Care [facility]) CM/SW Contact  [name], RN Phone Number: 01/01/2025, 9:06 AM  Clinical Narrative:    CM called and spoke with Zelvia, with [employer], yesterday afternoon.  She says that they have a meeting that afternoon and then again today at 11 am to discuss the care home, [facility].  She reports that [employer] did authorize for 90 day payment to the care home but then they went back and cannot find a license number for the facility.  She is not sure if they can fund the facility if they are not licensed.     Expected Discharge Plan:  (patient is currently homeless, unsure of discharge location at this time) Barriers to Discharge: Financial Resources               Expected Discharge Plan and Services   Discharge Planning Services: CM Consult   Living arrangements for the past 2 months: Homeless Expected Discharge Date: 11/03/24                                     Social Drivers of Health (SDOH) Interventions SDOH Screenings   Food Insecurity: Food Insecurity Present (10/20/2024)  Housing: High Risk (10/21/2024)  Transportation Needs: Unmet Transportation Needs (10/20/2024)  Utilities: At Risk (10/20/2024)  Alcohol  Screen: Low Risk (04/23/2024)  Social Connections: Unknown (04/23/2024)  Tobacco Use: High Risk (10/21/2024)    Readmission Risk Interventions     No data to display

## 2025-01-01 NOTE — Plan of Care (Signed)
   Problem: Activity: Goal: Risk for activity intolerance will decrease Outcome: Progressing   Problem: Nutrition: Goal: Adequate nutrition will be maintained Outcome: Progressing   Problem: Pain Managment: Goal: General experience of comfort will improve and/or be controlled Outcome: Progressing   Problem: Safety: Goal: Ability to remain free from injury will improve Outcome: Progressing

## 2025-01-01 NOTE — Progress Notes (Signed)
 " Progress Note   Patient: Corey House. FMW:[id number] DOB: 1966/06/05 DOA: 10/20/2024     73 DOS: the patient was seen and examined on 01/01/2025   Brief hospital course: Hospital course / significant events:   59 y.o. year old male with past medical history of  tobacco use disorder reporting a 41 year pack per year history, smoking ~1 pack per day since he was 59 years old. He denies any additional medical history though he has not seen a medical provider in ~20 years. He presents to [facility] ED with shortness of breath that began ~ 9 months ago and worsened ~3 months ago. He reports today his dyspnea is not significantly different than it was 3 months ago. He endorses a chronic cough that has been present since childhood. Her ports the cough is currently, minimally productive with white phlegm. Additionally, he endorses bilateral lower extremity swelling that began in the last 2 days, per his report (R) > (L). On exam the swelling is equal bilaterally. He endorses fatigue and dyspnea that is worse with position changes such as bending over and when standing from lying. He denies chest pain, palpitations, or fever.   ED Course: On arrival to [facility] ED patient was noted to be afebrile temp 36.8C, BP 142/111, HR 88, RR 22, SPO2 100% on 15L non-rebreather. Patient now on 2L via nasal cannula with SPO2 96%. CXR obtained and shows non specific findings of increased interstitial markings. CTA PE obtained and shows no PE, dilated RV and reflux of contract into the IVC and hepatic vein consistent with (R) heart failure, chronic bronchitis, and emphysema. US  DVT (B)LE negative for DVT. Labs notable Negative COVID, flu, RSV,for BNP 284.2, BUN 24, troponin 41--> 38 with no report of chest pain.  Lactic acid 0.9.  Blood cultures drawn and in process.  He was given Lasix , DuoNebs, and Solu-Medrol .    Patient was treated in the hospital, condition has improved. See A/P belwo for problem  based course. Currently just pending for placement.  01/09 CXR for TB r/o abn, --> CT chest concern for pneumonia, 01/10 pt reports no increased SOB or coughing but now that he considers it, he has been a lot more fatigued past few days. Started augmentin  + azithro, advised increased IS use and increased ambulation to encourage ventilation - CT images reviewed, PNA areas of concern lower posterior lungs question persistent atelectasis / hypoventilation increasing risk for infection.   01/10: PPD negative   01/12: pt feeling bad reporting more SOB trouble getting deep breath in, fatigue, jittery when xanax  is wearing off. 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 Flu/COVID swab   01/13: pt reports improvement - pending placement, continue IV abx for now, see TOC note  1/15.  TOC hopeful we can arrange placement for the patient soon   Consultants:  Psychiatry Pulmonology       Assessment and Plan: * Multifocal pneumonia Completed antibiotics 1/16.  Acute on chronic respiratory failure with hypoxia and hypercapnia (HCC) Multiple attempts were made to wean the patient from oxygen during the hospital.  Pulse ox drops with ambulation on room air (down to 84% on 1/13).  Patient unable to tolerate BiPAP.  Currently on 2 L.   Nausea Improved.  Patient  thinks it secondary to the Wellbutrin .  This medication was discontinued on 1/5.  Depression Increased Seroquel  to 50 mg at night on 12/18/2024.  On Xanax  and Lexapro .  Added Wellbutrin  on 12/22.  Patient asked to come off the Wellbutrin  on 1/5 secondary to feeling nauseous.  Wellbutrin  discontinued.  COPD with acute exacerbation (HCC) Treated during the hospital course  Acute on  chronic diastolic CHF (congestive heart failure) (HCC) EF 55 to 60%.  Off Lasix  at this time and stable  Tobacco use disorder On NicoDerm patch  Headache Improved.  Tylenol  as needed.  IV magnesium  given on 11/5.  Increased anion gap metabolic acidosis Resolved  Hypernatremia Resolved  Pulmonary nodules Will need to follow-up as outpatient.  Thrombocytosis Continue to watch intermittently  Protein-calorie malnutrition, severe Continue supplements        Subjective: Patient feels okay.  Offers no complaints.  Awaiting placement option.  Admitted suddenly 3 days ago with COPD exacerbation  Physical Exam: Vitals:   12/31/24 2138 01/01/25 0500 01/01/25 0633 01/01/25 0744  BP: 114/70  104/75 110/75  Pulse: 88  76 77  Resp: 16  18 19   Temp: 99 F (37.2 C)  (!) 97.5 F (36.4 C) 97.6 F (36.4 C)  TempSrc:   Oral Oral  SpO2: 93%  95% 97%  Weight:  62.4 kg    Height:       Physical Exam HENT:     Head: Normocephalic.  Cardiovascular:     Rate and Rhythm: Normal rate and regular rhythm.     Heart sounds: Normal heart sounds, S1 normal and S2 normal.  Pulmonary:     Breath sounds: Examination of the right-lower field reveals decreased breath sounds. Examination of the left-lower field reveals decreased breath sounds. Decreased breath sounds present. No wheezing, rhonchi or rales.  Abdominal:     Palpations: Abdomen is soft.     Tenderness: There is no abdominal tenderness.  Musculoskeletal:     Right lower leg: No swelling.     Left lower leg: No swelling.  Skin:    General: Skin is warm.  Neurological:     Mental Status: He is alert.     Data Reviewed: No new data  Disposition: Status is: Inpatient Remains inpatient appropriate because: Awaiting transitional care team to let me know that everything is all set for discharge.  Planned Discharge Destination: Will need placement    Time spent: 27 minutes  Author: Charlie Patterson, MD 01/01/2025 12:35  PM  For on call review [email or web address]  "

## 2025-01-01 NOTE — Plan of Care (Signed)
" °  Problem: Nutrition: Goal: Adequate nutrition will be maintained 01/01/2025 1557 by Raynold Lyle LABOR, RN Outcome: Progressing 01/01/2025 1530 by Raynold Lyle LABOR, RN Outcome: Progressing   Problem: Coping: Goal: Level of anxiety will decrease 01/01/2025 1557 by Raynold Lyle LABOR, RN Outcome: Progressing 01/01/2025 1530 by Raynold Lyle LABOR, RN Outcome: Progressing   Problem: Elimination: Goal: Will not experience complications related to bowel motility 01/01/2025 1557 by Raynold Lyle LABOR, RN Outcome: Progressing 01/01/2025 1530 by Raynold Lyle LABOR, RN Outcome: Progressing   Problem: Elimination: Goal: Will not experience complications related to urinary retention 01/01/2025 1557 by Raynold Lyle LABOR, RN Outcome: Progressing 01/01/2025 1530 by Raynold Lyle LABOR, RN Outcome: Progressing   "

## 2025-01-01 NOTE — TOC Progression Note (Signed)
 Transition of Care (TOC) - Progression Note    Patient Details  Name: Corey House. MRN: [id number] Date of Birth: [birth year]  Transition of Care [facility]) CM/SW Contact  [name], RN Phone Number: 01/01/2025, 4:49 PM  Clinical Narrative:    Received a call from Zelvia, [employer] will not be able to fund [facility] as they are not licensed as a mental health facility and the only way they were able to get funding for [name] was because of his diagnosis of depression.  We needs to fine a [facility] licensed Group home, they have already started reaching out.  CM will also start send out referrals.    Expected Discharge Plan:  (patient is currently homeless, unsure of discharge location at this time) Barriers to Discharge: Financial Resources               Expected Discharge Plan and Services   Discharge Planning Services: CM Consult   Living arrangements for the past 2 months: Homeless Expected Discharge Date: 11/03/24                                     Social Drivers of Health (SDOH) Interventions SDOH Screenings   Food Insecurity: Food Insecurity Present (10/20/2024)  Housing: High Risk (10/21/2024)  Transportation Needs: Unmet Transportation Needs (10/20/2024)  Utilities: At Risk (10/20/2024)  Alcohol  Screen: Low Risk (04/23/2024)  Social Connections: Unknown (04/23/2024)  Tobacco Use: High Risk (10/21/2024)    Readmission Risk Interventions     No data to display

## 2025-01-02 DIAGNOSIS — F3289 Other specified depressive episodes: Secondary | ICD-10-CM | POA: Diagnosis not present

## 2025-01-02 DIAGNOSIS — J188 Other pneumonia, unspecified organism: Secondary | ICD-10-CM | POA: Diagnosis not present

## 2025-01-02 DIAGNOSIS — J441 Chronic obstructive pulmonary disease with (acute) exacerbation: Secondary | ICD-10-CM | POA: Diagnosis not present

## 2025-01-02 DIAGNOSIS — J9621 Acute and chronic respiratory failure with hypoxia: Secondary | ICD-10-CM | POA: Diagnosis not present

## 2025-01-02 NOTE — Progress Notes (Signed)
 Encouraged pt to ambulate in hall this morning and this afternoon but he declined both times. Educated pt on importance of ambulation. Pt verbalized understanding.

## 2025-01-02 NOTE — Plan of Care (Signed)

## 2025-01-02 NOTE — Progress Notes (Signed)
 " Progress Note   Patient: Corey House. FMW:[id number] DOB: December 16, 1966 DOA: 10/20/2024     74 DOS: the patient was seen and examined on 01/02/2025   Brief hospital course: Hospital course / significant events:   59 y.o. year old male with past medical history of  tobacco use disorder reporting a 41 year pack per year history, smoking ~1 pack per day since he was 59 years old. He denies any additional medical history though he has not seen a medical provider in ~20 years. He presents to [facility] ED with shortness of breath that began ~ 9 months ago and worsened ~3 months ago. He reports today his dyspnea is not significantly different than it was 3 months ago. He endorses a chronic cough that has been present since childhood. Her ports the cough is currently, minimally productive with white phlegm. Additionally, he endorses bilateral lower extremity swelling that began in the last 2 days, per his report (R) > (L). On exam the swelling is equal bilaterally. He endorses fatigue and dyspnea that is worse with position changes such as bending over and when standing from lying. He denies chest pain, palpitations, or fever.   ED Course: On arrival to [facility] ED patient was noted to be afebrile temp 36.8C, BP 142/111, HR 88, RR 22, SPO2 100% on 15L non-rebreather. Patient now on 2L via nasal cannula with SPO2 96%. CXR obtained and shows non specific findings of increased interstitial markings. CTA PE obtained and shows no PE, dilated RV and reflux of contract into the IVC and hepatic vein consistent with (R) heart failure, chronic bronchitis, and emphysema. US  DVT (B)LE negative for DVT. Labs notable Negative COVID, flu, RSV,for BNP 284.2, BUN 24, troponin 41--> 38 with no report of chest pain.  Lactic acid 0.9.  Blood cultures drawn and in process.  He was given Lasix , DuoNebs, and Solu-Medrol .    Patient was treated in the hospital, condition has improved. See A/P belwo for problem  based course. Currently just pending for placement.  01/09 CXR for TB r/o abn, --> CT chest concern for pneumonia, 01/10 pt reports no increased SOB or coughing but now that he considers it, he has been a lot more fatigued past few days. Started augmentin  + azithro, advised increased IS use and increased ambulation to encourage ventilation - CT images reviewed, PNA areas of concern lower posterior lungs question persistent atelectasis / hypoventilation increasing risk for infection.   01/10: PPD negative   01/12: pt feeling bad reporting more SOB trouble getting deep breath in, fatigue, jittery when xanax  is wearing off. 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 Flu/COVID swab   01/13: pt reports improvement - pending placement, continue IV abx for now, see TOC note  1/15.  TOC hopeful we can arrange placement for the patient soon 1/17.  TOC will have to find another place for him.   Consultants:  Psychiatry Pulmonology       Assessment and Plan: * Acute on chronic respiratory failure with hypoxia and hypercapnia (HCC) Multiple attempts were made to wean the patient from oxygen during the hospital.  Pulse ox drops with ambulation on room air (down to 84% on 1/13).  Patient unable to tolerate BiPAP.  Currently on 2 L.  Multifocal pneumonia Completed antibiotics 1/16.  Depression Increased Seroquel  to 50 mg at night on 12/18/2024.  On Xanax  and Lexapro .  Added Wellbutrin  on 12/22.  Patient asked to come off the Wellbutrin  on 1/5 secondary to feeling nauseous.  Wellbutrin  discontinued.  COPD with acute exacerbation (HCC) Treated during the hospital course  Acute on chronic diastolic CHF (congestive heart failure) (HCC) EF  55 to 60%.  Off Lasix  at this time and stable  Nausea Improved.  Patient thinks it secondary to the Wellbutrin .  This medication was discontinued on 1/5.  Tobacco use disorder On NicoDerm patch  Headache Improved.  Tylenol  as needed.  IV magnesium  given on 11/5.  Increased anion gap metabolic acidosis Resolved  Hypernatremia Resolved  Pulmonary nodules Will need to follow-up as outpatient.  Thrombocytosis Continue to watch intermittently  Protein-calorie malnutrition, severe Continue supplements        Subjective: Admitted 74 days ago with COPD exacerbation.  Feels okay.  Still has a slight cough.  Completed antibiotics yesterday for pneumonia.  Physical Exam: Vitals:   01/01/25 1937 01/02/25 0320 01/02/25 0435 01/02/25 0817  BP: 125/88 106/86  115/76  Pulse: 78 87  76  Resp: 16 16  18   Temp: 97.9 F (36.6 C) (!) 97.5 F (36.4 C)  (!) 97.5 F (36.4 C)  TempSrc: Oral   Oral  SpO2: 91% 93%  96%  Weight:   62.6 kg   Height:       Physical Exam HENT:     Head: Normocephalic.     Mouth/Throat:     Pharynx: No oropharyngeal exudate.  Cardiovascular:     Rate and Rhythm: Normal rate and regular rhythm.     Heart sounds: Normal heart sounds, S1 normal and S2 normal.  Pulmonary:     Breath sounds: Examination of the right-lower field reveals decreased breath sounds. Examination of the left-lower field reveals decreased breath sounds. Decreased breath sounds present. No wheezing, rhonchi or rales.  Abdominal:     Palpations: Abdomen is soft.     Tenderness: There is no abdominal tenderness.  Musculoskeletal:     Right lower leg: No swelling.     Left lower leg: No swelling.  Skin:    General: Skin is warm.  Neurological:     Mental Status: He is alert.     Data Reviewed: No new data  Disposition: Status is: Inpatient Unfortunately patient is homeless and requires 24/7 oxygen.  TOC needs to work on another destination for disposition.  Planned  Discharge Destination: Will need some sort of placement    Time spent: 26 minutes  Author: Charlie Patterson, MD 01/02/2025 10:58 AM  For on call review [email or web address]  "

## 2025-01-02 NOTE — Plan of Care (Signed)

## 2025-01-02 NOTE — Progress Notes (Signed)
 Pt requested removal of PIV now that IV abx have been completed. Per Dr. Josette, ok to remove PIV.

## 2025-01-03 DIAGNOSIS — J9621 Acute and chronic respiratory failure with hypoxia: Secondary | ICD-10-CM | POA: Diagnosis not present

## 2025-01-03 DIAGNOSIS — I5033 Acute on chronic diastolic (congestive) heart failure: Secondary | ICD-10-CM | POA: Diagnosis not present

## 2025-01-03 DIAGNOSIS — J9622 Acute and chronic respiratory failure with hypercapnia: Secondary | ICD-10-CM | POA: Diagnosis not present

## 2025-01-03 DIAGNOSIS — R918 Other nonspecific abnormal finding of lung field: Secondary | ICD-10-CM | POA: Diagnosis not present

## 2025-01-03 DIAGNOSIS — R110 Nausea: Secondary | ICD-10-CM | POA: Diagnosis not present

## 2025-01-03 DIAGNOSIS — J441 Chronic obstructive pulmonary disease with (acute) exacerbation: Secondary | ICD-10-CM | POA: Diagnosis not present

## 2025-01-03 DIAGNOSIS — F3289 Other specified depressive episodes: Secondary | ICD-10-CM | POA: Diagnosis not present

## 2025-01-03 DIAGNOSIS — E8729 Other acidosis: Secondary | ICD-10-CM | POA: Diagnosis not present

## 2025-01-03 DIAGNOSIS — E870 Hyperosmolality and hypernatremia: Secondary | ICD-10-CM | POA: Diagnosis not present

## 2025-01-03 DIAGNOSIS — D75839 Thrombocytosis, unspecified: Secondary | ICD-10-CM | POA: Diagnosis not present

## 2025-01-03 DIAGNOSIS — J188 Other pneumonia, unspecified organism: Secondary | ICD-10-CM | POA: Diagnosis not present

## 2025-01-03 DIAGNOSIS — E43 Unspecified severe protein-calorie malnutrition: Secondary | ICD-10-CM | POA: Diagnosis not present

## 2025-01-03 NOTE — Plan of Care (Signed)
" °  Problem: Clinical Measurements: Goal: Diagnostic test results will improve Outcome: Progressing Goal: Cardiovascular complication will be avoided Outcome: Progressing   Problem: Activity: Goal: Risk for activity intolerance will decrease Outcome: Progressing   Problem: Nutrition: Goal: Adequate nutrition will be maintained Outcome: Progressing   Problem: Coping: Goal: Level of anxiety will decrease Outcome: Progressing   Problem: Elimination: Goal: Will not experience complications related to bowel motility Outcome: Progressing Goal: Will not experience complications related to urinary retention Outcome: Progressing   "

## 2025-01-03 NOTE — Progress Notes (Signed)
 " Progress Note   Patient: Corey House. FMW:[id number] DOB: 1966-04-18 DOA: 10/20/2024     75 DOS: the patient was seen and examined on 01/03/2025   Brief hospital course: Hospital course / significant events:   59 y.o. year old male with past medical history of  tobacco use disorder reporting a 41 year pack per year history, smoking ~1 pack per day since he was 59 years old. He denies any additional medical history though he has not seen a medical provider in ~20 years. He presents to [facility] ED with shortness of breath that began ~ 9 months ago and worsened ~3 months ago. He reports today his dyspnea is not significantly different than it was 3 months ago. He endorses a chronic cough that has been present since childhood. Her ports the cough is currently, minimally productive with white phlegm. Additionally, he endorses bilateral lower extremity swelling that began in the last 2 days, per his report (R) > (L). On exam the swelling is equal bilaterally. He endorses fatigue and dyspnea that is worse with position changes such as bending over and when standing from lying. He denies chest pain, palpitations, or fever.   ED Course: On arrival to [facility] ED patient was noted to be afebrile temp 36.8C, BP 142/111, HR 88, RR 22, SPO2 100% on 15L non-rebreather. Patient now on 2L via nasal cannula with SPO2 96%. CXR obtained and shows non specific findings of increased interstitial markings. CTA PE obtained and shows no PE, dilated RV and reflux of contract into the IVC and hepatic vein consistent with (R) heart failure, chronic bronchitis, and emphysema. US  DVT (B)LE negative for DVT. Labs notable Negative COVID, flu, RSV,for BNP 284.2, BUN 24, troponin 41--> 38 with no report of chest pain.  Lactic acid 0.9.  Blood cultures drawn and in process.  He was given Lasix , DuoNebs, and Solu-Medrol .    Patient was treated in the hospital, condition has improved. See A/P belwo for problem  based course. Currently just pending for placement.  01/09 CXR for TB r/o abn, --> CT chest concern for pneumonia, 01/10 pt reports no increased SOB or coughing but now that he considers it, he has been a lot more fatigued past few days. Started augmentin  + azithro, advised increased IS use and increased ambulation to encourage ventilation - CT images reviewed, PNA areas of concern lower posterior lungs question persistent atelectasis / hypoventilation increasing risk for infection.   01/10: PPD negative   01/12: pt feeling bad reporting more SOB trouble getting deep breath in, fatigue, jittery when xanax  is wearing off. 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 Flu/COVID swab   01/13: pt reports improvement - pending placement, continue IV abx for now, see TOC note  1/15.  TOC hopeful we can arrange placement for the patient soon 1/17.  TOC will have to find another place for him.   Consultants:  Psychiatry Pulmonology       Assessment and Plan: * Acute on chronic respiratory failure with hypoxia and hypercapnia (HCC) Multiple attempts were made to wean the patient from oxygen during the hospital.  Pulse ox drops with ambulation on room air (down to 84% on 1/13).  Patient unable to tolerate BiPAP.  Currently on 2 L.  Multifocal pneumonia Completed antibiotics 1/16.  Depression Increased Seroquel  to 50 mg at night on 12/18/2024.  On Xanax  and Lexapro .  Added Wellbutrin  on 12/22.  Patient asked to come off the Wellbutrin  on 1/5 secondary to feeling nauseous.  Wellbutrin  discontinued.  COPD with acute exacerbation (HCC) Treated during the hospital course  Acute on chronic diastolic CHF (congestive heart failure) (HCC) EF  55 to 60%.  Off Lasix  at this time and stable  Nausea Improved.  Patient thinks it secondary to the Wellbutrin .  This medication was discontinued on 1/5.  Tobacco use disorder On NicoDerm patch  Headache Improved.  Tylenol  as needed.  IV magnesium  given on 11/5.  Increased anion gap metabolic acidosis Resolved  Hypernatremia Resolved  Pulmonary nodules Will need to follow-up as outpatient.  Thrombocytosis Continue to watch intermittently  Protein-calorie malnutrition, severe Continue supplements        Subjective: Patient feels okay.  Admitted 75 days ago with COPD exacerbation.  Patient requiring 24/7 oxygen and will need placement.  Physical Exam: Vitals:   01/02/25 1712 01/02/25 2027 01/03/25 0617 01/03/25 0904  BP: 102/76 107/81 111/80 115/76  Pulse: 83 77 78 84  Resp: 16 18 18 18   Temp: 98 F (36.7 C) 98.1 F (36.7 C) 97.7 F (36.5 C) 97.7 F (36.5 C)  TempSrc: Oral     SpO2: 95% 96% 97% 96%  Weight:      Height:       Physical Exam HENT:     Head: Normocephalic.     Mouth/Throat:     Pharynx: No oropharyngeal exudate.  Cardiovascular:     Rate and Rhythm: Normal rate and regular rhythm.     Heart sounds: Normal heart sounds, S1 normal and S2 normal.  Pulmonary:     Breath sounds: Examination of the right-lower field reveals decreased breath sounds. Examination of the left-lower field reveals decreased breath sounds. Decreased breath sounds present. No wheezing, rhonchi or rales.  Abdominal:     Palpations: Abdomen is soft.     Tenderness: There is no abdominal tenderness.  Musculoskeletal:     Right lower leg: No swelling.     Left lower leg: No swelling.  Skin:    General: Skin is warm.  Neurological:     Mental Status: He is alert.     Data Reviewed: No new data  Disposition: Status is: Inpatient Remains inpatient appropriate because: Will need placement (requiring oxygen 24/7 and is homeless)  Planned Discharge Destination: Will  need placement    Time spent: 25 minutes  Author: Charlie Patterson, MD 01/03/2025 3:08 PM  For on call review [email or web address].  "

## 2025-01-04 DIAGNOSIS — I5033 Acute on chronic diastolic (congestive) heart failure: Secondary | ICD-10-CM | POA: Diagnosis not present

## 2025-01-04 DIAGNOSIS — E43 Unspecified severe protein-calorie malnutrition: Secondary | ICD-10-CM | POA: Diagnosis not present

## 2025-01-04 DIAGNOSIS — J188 Other pneumonia, unspecified organism: Secondary | ICD-10-CM | POA: Diagnosis not present

## 2025-01-04 DIAGNOSIS — J441 Chronic obstructive pulmonary disease with (acute) exacerbation: Secondary | ICD-10-CM | POA: Diagnosis not present

## 2025-01-04 DIAGNOSIS — R918 Other nonspecific abnormal finding of lung field: Secondary | ICD-10-CM | POA: Diagnosis not present

## 2025-01-04 DIAGNOSIS — R110 Nausea: Secondary | ICD-10-CM | POA: Diagnosis not present

## 2025-01-04 DIAGNOSIS — D75839 Thrombocytosis, unspecified: Secondary | ICD-10-CM | POA: Diagnosis not present

## 2025-01-04 DIAGNOSIS — J9621 Acute and chronic respiratory failure with hypoxia: Secondary | ICD-10-CM | POA: Diagnosis not present

## 2025-01-04 DIAGNOSIS — F17200 Nicotine dependence, unspecified, uncomplicated: Secondary | ICD-10-CM | POA: Diagnosis not present

## 2025-01-04 DIAGNOSIS — F3289 Other specified depressive episodes: Secondary | ICD-10-CM | POA: Diagnosis not present

## 2025-01-04 DIAGNOSIS — J9622 Acute and chronic respiratory failure with hypercapnia: Secondary | ICD-10-CM | POA: Diagnosis not present

## 2025-01-04 NOTE — Progress Notes (Signed)
 " Progress Note   Patient: Corey House. FMW:[id number] DOB: 01/16/1966 DOA: 10/20/2024     76 DOS: the patient was seen and examined on 01/04/2025   Brief hospital course: Hospital course / significant events:   59 y.o. year old male with past medical history of  tobacco use disorder reporting a 41 year pack per year history, smoking ~1 pack per day since he was 59 years old. He denies any additional medical history though he has not seen a medical provider in ~20 years. He presents to [facility] ED with shortness of breath that began ~ 9 months ago and worsened ~3 months ago. He reports today his dyspnea is not significantly different than it was 3 months ago. He endorses a chronic cough that has been present since childhood. Her ports the cough is currently, minimally productive with white phlegm. Additionally, he endorses bilateral lower extremity swelling that began in the last 2 days, per his report (R) > (L). On exam the swelling is equal bilaterally. He endorses fatigue and dyspnea that is worse with position changes such as bending over and when standing from lying. He denies chest pain, palpitations, or fever.   ED Course: On arrival to [facility] ED patient was noted to be afebrile temp 36.8C, BP 142/111, HR 88, RR 22, SPO2 100% on 15L non-rebreather. Patient now on 2L via nasal cannula with SPO2 96%. CXR obtained and shows non specific findings of increased interstitial markings. CTA PE obtained and shows no PE, dilated RV and reflux of contract into the IVC and hepatic vein consistent with (R) heart failure, chronic bronchitis, and emphysema. US  DVT (B)LE negative for DVT. Labs notable Negative COVID, flu, RSV,for BNP 284.2, BUN 24, troponin 41--> 38 with no report of chest pain.  Lactic acid 0.9.  Blood cultures drawn and in process.  He was given Lasix , DuoNebs, and Solu-Medrol .    Patient was treated in the hospital, condition has improved. See A/P belwo for problem  based course. Currently just pending for placement.  01/09 CXR for TB r/o abn, --> CT chest concern for pneumonia, 01/10 pt reports no increased SOB or coughing but now that he considers it, he has been a lot more fatigued past few days. Started augmentin  + azithro, advised increased IS use and increased ambulation to encourage ventilation - CT images reviewed, PNA areas of concern lower posterior lungs question persistent atelectasis / hypoventilation increasing risk for infection.   01/10: PPD negative   01/12: pt feeling bad reporting more SOB trouble getting deep breath in, fatigue, jittery when xanax  is wearing off. 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 Flu/COVID swab   01/13: pt reports improvement - pending placement, continue IV abx for now, see TOC note  1/15.  TOC hopeful we can arrange placement for the patient soon 1/17.  TOC will have to find another place for him.   Consultants:  Psychiatry Pulmonology       Assessment and Plan: * Acute on chronic respiratory failure with hypoxia and hypercapnia (HCC) Multiple attempts were made to wean the patient from oxygen during the hospital course.  Pulse ox drops with ambulation on room air (down to 84% on 1/13).  Patient unable to tolerate BiPAP.  Currently on 2 L.  Multifocal pneumonia Completed antibiotics 1/16.  Depression Increased Seroquel  to 50 mg at night on 12/18/2024.  On Xanax  and Lexapro .  Added Wellbutrin  on 12/22.  Patient asked to come off the Wellbutrin  on 1/5 secondary to feeling nauseous.  Wellbutrin  discontinued.  COPD with acute exacerbation (HCC) Treated during the hospital course  Acute on chronic diastolic CHF (congestive heart failure)  (HCC) EF 55 to 60%.  Off Lasix  at this time and stable  Nausea Resolved.  Patient believes this was secondary to Wellbutrin  which was discontinued.  Tobacco use disorder On NicoDerm patch  Headache Resolved.  Tylenol  as needed.  IV magnesium  given on 11/5.  Increased anion gap metabolic acidosis Resolved  Hypernatremia Resolved  Pulmonary nodules Will need to follow-up as outpatient.  Pulmonary referral placed.  Thrombocytosis Continue to watch intermittently  Protein-calorie malnutrition, severe Continue supplements        Subjective: Patient admitted 7 to 6 days ago for COPD exacerbation.  Patient feels okay.  Has a little bit of cough.  Physical Exam: Vitals:   01/03/25 2032 01/04/25 0414 01/04/25 0500 01/04/25 0845  BP: 122/81 109/73  110/78  Pulse: 86 83  80  Resp: 14 18  20   Temp: 98.1 F (36.7 C) (!) 97.4 F (36.3 C)  97.9 F (36.6 C)  TempSrc:    Oral  SpO2: 96% 96%  98%  Weight:   62.7 kg   Height:       Physical Exam HENT:     Head: Normocephalic.     Mouth/Throat:     Pharynx: No oropharyngeal exudate.  Cardiovascular:     Rate and Rhythm: Normal rate and regular rhythm.     Heart sounds: Normal heart sounds, S1 normal and S2 normal.  Pulmonary:     Breath sounds: Examination of the right-lower field reveals decreased breath sounds. Examination of the left-lower field reveals decreased breath sounds. Decreased breath sounds present. No wheezing, rhonchi or rales.  Abdominal:     Palpations: Abdomen is soft.     Tenderness: There is no abdominal tenderness.  Musculoskeletal:     Right lower leg: No swelling.     Left lower leg: No swelling.  Skin:    General: Skin is warm.  Neurological:     Mental Status: He is alert.     Data Reviewed: No new data  Disposition: Status is: Inpatient Remains inpatient appropriate because: Medically stable to be placed.  COC working on placement with insurance company.  Unfortunately patient is  homeless and requires 24/7 oxygen.  Planned Discharge Destination: Will need placement    Time spent: 27 minutes  Author: Charlie Patterson, MD 01/04/2025 1:44 PM  For on call review [email or web address].  "

## 2025-01-04 NOTE — Plan of Care (Signed)
  Problem: Education: Goal: Knowledge of General Education information will improve Description: Including pain rating scale, medication(s)/side effects and non-pharmacologic comfort measures Outcome: Progressing   Problem: Health Behavior/Discharge Planning: Goal: Ability to manage health-related needs will improve Outcome: Progressing   Problem: Clinical Measurements: Goal: Ability to maintain clinical measurements within normal limits will improve Outcome: Progressing Goal: Will remain free from infection Outcome: Progressing Goal: Diagnostic test results will improve Outcome: Progressing Goal: Respiratory complications will improve Outcome: Progressing Goal: Cardiovascular complication will be avoided Outcome: Progressing   Problem: Health Behavior/Discharge Planning: Goal: Ability to manage health-related needs will improve Outcome: Progressing

## 2025-01-05 NOTE — Plan of Care (Signed)
" °  Problem: Education: Goal: Knowledge of General Education information will improve Description: Including pain rating scale, medication(s)/side effects and non-pharmacologic comfort measures Outcome: Progressing   Problem: Clinical Measurements: Goal: Will remain free from infection Outcome: Progressing Goal: Respiratory complications will improve Outcome: Progressing   Problem: Activity: Goal: Risk for activity intolerance will decrease Outcome: Progressing   Problem: Coping: Goal: Level of anxiety will decrease Outcome: Progressing   Problem: Elimination: Goal: Will not experience complications related to bowel motility Outcome: Progressing   Problem: Pain Managment: Goal: General experience of comfort will improve and/or be controlled Outcome: Progressing   Problem: Safety: Goal: Ability to remain free from injury will improve Outcome: Progressing   Problem: Skin Integrity: Goal: Risk for impaired skin integrity will decrease Outcome: Progressing   Problem: Education: Goal: Knowledge of disease or condition will improve Outcome: Progressing   "

## 2025-01-05 NOTE — Progress Notes (Signed)
 " Progress Note   Patient: Corey House. FMW:[id number] DOB: 03-21-1966 DOA: 10/20/2024     77 DOS: the patient was seen and examined on 01/05/2025   Brief hospital course: Hospital course / significant events:   59 y.o. year old male with past medical history of  tobacco use disorder reporting a 41 year pack per year history, smoking ~1 pack per day since he was 59 years old. He denies any additional medical history though he has not seen a medical provider in ~20 years. He presents to [facility] ED with shortness of breath that began ~ 9 months ago and worsened ~3 months ago. He reports today his dyspnea is not significantly different than it was 3 months ago. He endorses a chronic cough that has been present since childhood. Her ports the cough is currently, minimally productive with white phlegm. Additionally, he endorses bilateral lower extremity swelling that began in the last 2 days, per his report (R) > (L). On exam the swelling is equal bilaterally. He endorses fatigue and dyspnea that is worse with position changes such as bending over and when standing from lying. He denies chest pain, palpitations, or fever.   ED Course: On arrival to [facility] ED patient was noted to be afebrile temp 36.8C, BP 142/111, HR 88, RR 22, SPO2 100% on 15L non-rebreather. Patient now on 2L via nasal cannula with SPO2 96%. CXR obtained and shows non specific findings of increased interstitial markings. CTA PE obtained and shows no PE, dilated RV and reflux of contract into the IVC and hepatic vein consistent with (R) heart failure, chronic bronchitis, and emphysema. US  DVT (B)LE negative for DVT. Labs notable Negative COVID, flu, RSV,for BNP 284.2, BUN 24, troponin 41--> 38 with no report of chest pain.  Lactic acid 0.9.  Blood cultures drawn and in process.  He was given Lasix , DuoNebs, and Solu-Medrol .    Patient was treated in the hospital, condition has improved. See A/P belwo for problem  based course. Currently just pending for placement.  01/09 CXR for TB r/o abn, --> CT chest concern for pneumonia, 01/10 pt reports no increased SOB or coughing but now that he considers it, he has been a lot more fatigued past few days. Started augmentin  + azithro, advised increased IS use and increased ambulation to encourage ventilation - CT images reviewed, PNA areas of concern lower posterior lungs question persistent atelectasis / hypoventilation increasing risk for infection.   01/10: PPD negative   01/12: pt feeling bad reporting more SOB trouble getting deep breath in, fatigue, jittery when xanax  is wearing off. 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 Flu/COVID swab   01/13: pt reports improvement - pending placement, continue IV abx for now, see TOC note  1/15.  TOC hopeful we can arrange placement for the patient soon 1/17.  TOC will have to find another place for him.   Consultants:  Psychiatry Pulmonology       Assessment and Plan: * Acute on chronic respiratory failure with hypoxia and hypercapnia (HCC) Multiple attempts were made to wean the patient from oxygen during the hospital course.  Pulse ox drops with ambulation on room air (down to 84% on 1/13).  Patient unable to tolerate BiPAP.  Currently on 2 L.  Will need placement.  Multifocal pneumonia Completed antibiotics 1/16.  Depression Increased Seroquel  to 50 mg at night on 12/18/2024.  On Xanax  and Lexapro .  Added Wellbutrin  on 12/22.  Patient asked to come off the Wellbutrin  on 1/5 secondary to feeling nauseous.  Wellbutrin  discontinued.  COPD with acute exacerbation (HCC) Treated during the hospital course  Acute on chronic diastolic CHF  (congestive heart failure) (HCC) EF 55 to 60%.  Off Lasix  at this time and stable  Nausea Resolved.  Patient believes this was secondary to Wellbutrin  which was discontinued.  Tobacco use disorder On NicoDerm patch  Headache Resolved.  Tylenol  as needed.  IV magnesium  given on 11/5.  Increased anion gap metabolic acidosis Resolved  Hypernatremia Resolved  Pulmonary nodules Will need to follow-up as outpatient.  Pulmonary referral placed.  Thrombocytosis Continue to watch intermittently  Protein-calorie malnutrition, severe Continue supplements        Subjective: Patient feels okay.  Offers no complaints.  Admitted 77 days ago with COPD exacerbation.  Encouraged ambulation  Physical Exam: Vitals:   01/04/25 1938 01/05/25 0415 01/05/25 0500 01/05/25 0838  BP: (!) 127/92 92/73  110/84  Pulse: 78 81  74  Resp: 18 18  18   Temp: 97.9 F (36.6 C) 97.8 F (36.6 C)  98.1 F (36.7 C)  TempSrc: Oral Oral  Oral  SpO2: 97% 91%  96%  Weight:   61.6 kg   Height:       Physical Exam HENT:     Head: Normocephalic.     Mouth/Throat:     Pharynx: No oropharyngeal exudate.  Cardiovascular:     Rate and Rhythm: Normal rate and regular rhythm.     Heart sounds: Normal heart sounds, S1 normal and S2 normal.  Pulmonary:     Breath sounds: Examination of the right-lower field reveals decreased breath sounds. Examination of the left-lower field reveals decreased breath sounds. Decreased breath sounds present. No wheezing, rhonchi or rales.  Abdominal:     Palpations: Abdomen is soft.     Tenderness: There is no abdominal tenderness.  Musculoskeletal:     Right lower leg: No swelling.     Left lower leg: No swelling.  Skin:    General: Skin is warm.  Neurological:     Mental Status: He is alert.     Data Reviewed: Ordered labs for tomorrow  Disposition: Status is: Inpatient Remains inpatient appropriate because: Patient requiring 24/7 oxygen and is homeless he will  need placement.  Planned Discharge Destination: Will need placement    Time spent: 28 minutes  Author: Charlie Patterson, MD 01/05/2025 12:22 PM  For on call review [email or web address].  "

## 2025-01-06 DIAGNOSIS — J9621 Acute and chronic respiratory failure with hypoxia: Secondary | ICD-10-CM | POA: Diagnosis not present

## 2025-01-06 DIAGNOSIS — J9622 Acute and chronic respiratory failure with hypercapnia: Secondary | ICD-10-CM | POA: Diagnosis not present

## 2025-01-06 LAB — BASIC METABOLIC PANEL WITH GFR
Anion gap: 8 (ref 5–15)
BUN: 18 mg/dL (ref 6–20)
CO2: 29 mmol/L (ref 22–32)
Calcium: 8.7 mg/dL — ABNORMAL LOW (ref 8.9–10.3)
Chloride: 102 mmol/L (ref 98–111)
Creatinine, Ser: 0.73 mg/dL (ref 0.61–1.24)
GFR, Estimated: >60 mL/min
Glucose, Bld: 101 mg/dL — ABNORMAL HIGH (ref 70–99)
Potassium: 4.4 mmol/L (ref 3.5–5.1)
Sodium: 138 mmol/L (ref 135–145)

## 2025-01-06 LAB — CBC
HCT: 40.1 % (ref 39.0–52.0)
Hemoglobin: 12.9 g/dL — ABNORMAL LOW (ref 13.0–17.0)
MCH: 29.4 pg (ref 26.0–34.0)
MCHC: 32.2 g/dL (ref 30.0–36.0)
MCV: 91.3 fL (ref 80.0–100.0)
Platelets: 278 K/uL (ref 150–400)
RBC: 4.39 MIL/uL (ref 4.22–5.81)
RDW: 13 % (ref 11.5–15.5)
WBC: 5.7 K/uL (ref 4.0–10.5)
nRBC: 0 % (ref 0.0–0.2)

## 2025-01-06 NOTE — Plan of Care (Signed)

## 2025-01-06 NOTE — Progress Notes (Signed)
 " PROGRESS NOTE    Corey House.  FMW:[id number] DOB: [birth year] DOA: 10/20/2024 PCP: Patient, [name]  Subjective: No acute events overnight. Seen and examined at bedside. No new complaints. Tolerating oral intake without n/v. Denies constipation.   Hospital Course: Hospital course / significant events:   59 y.o. year old male with past medical history of  tobacco use disorder reporting a 41 year pack per year history, smoking ~1 pack per day since he was 59 years old. He denies any additional medical history though he has not seen a medical provider in ~20 years. He presents to [facility] ED with shortness of breath that began ~ 9 months ago and worsened ~3 months ago. He reports today his dyspnea is not significantly different than it was 3 months ago. He endorses a chronic cough that has been present since childhood. Her ports the cough is currently, minimally productive with white phlegm. Additionally, he endorses bilateral lower extremity swelling that began in the last 2 days, per his report (R) > (L). On exam the swelling is equal bilaterally. He endorses fatigue and dyspnea that is worse with position changes such as bending over and when standing from lying. He denies chest pain, palpitations, or fever.   ED Course: On arrival to [facility] ED patient was noted to be afebrile temp 36.8C, BP 142/111, HR 88, RR 22, SPO2 100% on 15L non-rebreather. Patient now on 2L via nasal cannula with SPO2 96%. CXR obtained and shows non specific findings of increased interstitial markings. CTA PE obtained and shows no PE, dilated RV and reflux of contract into the IVC and hepatic vein consistent with (R) heart failure, chronic bronchitis, and emphysema. US  DVT (B)LE negative for DVT. Labs notable Negative COVID, flu, RSV,for BNP 284.2, BUN 24, troponin 41--> 38 with no report of chest pain.  Lactic acid 0.9.  Blood cultures drawn and in process.  He was given Lasix , DuoNebs, and  Solu-Medrol .    Patient was treated in the hospital, condition has improved. See A/P belwo for problem based course. Currently just pending for placement.  CXR for TB r/o abn. CT chest with concern for pneumonia. 01/10 pt reported no increased SOB or coughing but more fatigued for few days. Started augmentin  + azithro, advised increased IS use and increased ambulation to encourage ventilation. CT images reviewed, PNA areas of concern lower posterior lungs with questionable persistent atelectasis / hypoventilation that may increase risk for infection. PPD negative. On 1/12, pt reported feeling bad with more SOB, fatigue, and jitteriness when xanax  wearing off. Switched augmentin  to IV rocephin , give 1L fluids, duonebs scheduled, steroids, held anoro. Lung exam no major concerns. 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 Clinically improved. Antibiotic course finished. Medically ready pending placement. 1/15.  TOC hopeful we can arrange placement for the patient soon. 1/17.  TOC will have to find another place for him.    Assessment and Plan:  Acute on chronic respiratory failure with hypoxia and hypercapnia (HCC) Multiple attempts were made to wean the patient from oxygen during the hospital course.  Pulse ox drops with ambulation on room air (down to 84% on 1/13).  Patient unable to tolerate BiPAP.  Currently on 2 L.  Will need placement.   Multifocal pneumonia Completed antibiotics 1/16.   Depression Seen by psychiatry this admission who recommended resumption of home meds which were titrated by hospitalist service as below:  Increased Seroquel  to 50 mg at night on 12/18/2024.  On Xanax  and Lexapro .  Added Wellbutrin  on 12/22.  Patient asked to come off the Wellbutrin  on 1/5 secondary to feeling nauseous. Wellbutrin  discontinued.   COPD with acute exacerbation (HCC) Treated during the  hospital course   Acute on chronic diastolic CHF (congestive heart failure) (HCC) EF 55 to 60%.  Off Lasix  at this time and stable   Nausea Resolved.  Patient believes this was secondary to Wellbutrin  which was discontinued.   Tobacco use disorder On NicoDerm patch   Headache Resolved.  Tylenol  as needed.  IV magnesium  given on 11/5.   Increased anion gap metabolic acidosis Resolved   Hypernatremia Resolved   Pulmonary nodules Will need to follow-up as outpatient.  Pulmonary referral placed.   Thrombocytosis Continue to watch intermittently   Protein-calorie malnutrition, severe Continue supplements  DVT prophylaxis: rivaroxaban  (XARELTO ) tablet 10 mg Start: 12/22/24 1700 rivaroxaban  (XARELTO ) tablet 10 mg  Xarelto    Code Status: Full Code  Disposition Plan: TBD Reason for continuing need for hospitalization: medically ready, homeless with new oxygen requirement, TOC team following  Objective: Vitals:   01/05/25 2042 01/06/25 0339 01/06/25 0526 01/06/25 0851  BP: 113/83  102/70 113/71  Pulse: 78  77 93  Resp:   18 20  Temp:   97.8 F (36.6 C) 97.9 F (36.6 C)  TempSrc:   Oral Oral  SpO2: 95%  96% 90%  Weight:  64 kg    Height:        Intake/Output Summary (Last 24 hours) at 01/06/2025 1408 Last data filed at 01/06/2025 0900 Gross per 24 hour  Intake 720 ml  Output --  Net 720 ml   Filed Weights   01/04/25 0500 01/05/25 0500 01/06/25 0339  Weight: 62.7 kg 61.6 kg 64 kg    Examination:  Physical Exam Vitals and nursing note reviewed.  Constitutional:      General: He is not in acute distress.    Appearance: He is ill-appearing (chronically).  Cardiovascular:     Rate and Rhythm: Normal rate and regular rhythm.     Pulses: Normal pulses.     Heart sounds: Normal heart sounds.  Pulmonary:     Effort: Pulmonary effort is normal.     Breath sounds: Normal breath sounds.  Abdominal:     General: Bowel sounds are normal.     Palpations: Abdomen is  soft.  Neurological:     Mental Status: He is alert.     Data Reviewed: I have personally reviewed following labs and imaging studies  CBC: Recent Labs  Lab 01/06/25 0629  WBC 5.7  HGB 12.9*  HCT 40.1  MCV 91.3  PLT 278   Basic Metabolic Panel: Recent Labs  Lab 01/06/25 0629  NA 138  K 4.4  CL 102  CO2 29  GLUCOSE 101*  BUN 18  CREATININE 0.73  CALCIUM 8.7*   GFR: Estimated Creatinine Clearance: 85.3 mL/min (by C-G formula based on SCr of 0.73 mg/dL). Liver Function Tests: No results for input(s): AST, ALT, ALKPHOS, BILITOT, PROT, ALBUMIN in the last 168 hours. No results for input(s): LIPASE, AMYLASE in the last 168 hours. No results for input(s): AMMONIA in the last 168 hours. Coagulation Profile: No results for input(s): INR, PROTIME in the last 168 hours. Cardiac Enzymes: No results for input(s): CKTOTAL, CKMB, CKMBINDEX, TROPONINI in the last 168 hours. ProBNP, BNP (last 5 results) Recent Labs    10/20/24 1103 10/29/24 0608  PROBNP  --  <  50.0  BNP 284.2*  --    HbA1C: No results for input(s): HGBA1C in the last 72 hours. CBG: No results for input(s): GLUCAP in the last 168 hours. Lipid Profile: No results for input(s): CHOL, HDL, LDLCALC, TRIG, CHOLHDL, LDLDIRECT in the last 72 hours. Thyroid Function Tests: No results for input(s): TSH, T4TOTAL, FREET4, T3FREE, THYROIDAB in the last 72 hours. Anemia Panel: No results for input(s): VITAMINB12, FOLATE, FERRITIN, TIBC, IRON, RETICCTPCT in the last 72 hours. Sepsis Labs: No results for input(s): PROCALCITON, LATICACIDVEN in the last 168 hours.  Recent Results (from the past 240 hours)  Resp panel by RT-PCR (RSV, Flu A&B, Covid) Anterior Nasal Swab     Status: None   Collection Time: 12/28/24  9:28 AM   Specimen: Anterior Nasal Swab  Result Value Ref Range Status   SARS Coronavirus 2 by RT PCR NEGATIVE NEGATIVE Final     Comment: (NOTE) SARS-CoV-2 target nucleic acids are NOT DETECTED.  The SARS-CoV-2 RNA is generally detectable in upper respiratory specimens during the acute phase of infection. The lowest concentration of SARS-CoV-2 viral copies this assay can detect is 138 copies/mL. A negative result does not preclude SARS-Cov-2 infection and should not be used as the sole basis for treatment or other patient management decisions. A negative result may occur with  improper specimen collection/handling, submission of specimen other than nasopharyngeal swab, presence of viral mutation(s) within the areas targeted by this assay, and inadequate number of viral copies(<138 copies/mL). A negative result must be combined with clinical observations, patient history, and epidemiological information. The expected result is Negative.  Fact Sheet for Patients:  bloggercourse.com  Fact Sheet for Healthcare Providers:  seriousbroker.it  This test is no t yet approved or cleared by the [place]  FDA and  has been authorized for detection and/or diagnosis of SARS-CoV-2 by FDA under an Emergency Use Authorization (EUA). This EUA will remain  in effect (meaning this test can be used) for the duration of the COVID-19 declaration under Section 564(b)(1) of the Act, 21 U.S.C.section 360bbb-3(b)(1), unless the authorization is terminated  or revoked sooner.       Influenza A by PCR NEGATIVE NEGATIVE Final   Influenza B by PCR NEGATIVE NEGATIVE Final    Comment: (NOTE) The Xpert Xpress SARS-CoV-2/FLU/RSV plus assay is intended as an aid in the diagnosis of influenza from Nasopharyngeal swab specimens and should not be used as a sole basis for treatment. Nasal washings and aspirates are unacceptable for Xpert Xpress SARS-CoV-2/FLU/RSV testing.  Fact Sheet for Patients: bloggercourse.com  Fact Sheet for Healthcare  Providers: seriousbroker.it  This test is not yet approved or cleared by the [place]  FDA and has been authorized for detection and/or diagnosis of SARS-CoV-2 by FDA under an Emergency Use Authorization (EUA). This EUA will remain in effect (meaning this test can be used) for the duration of the COVID-19 declaration under Section 564(b)(1) of the Act, 21 U.S.C. section 360bbb-3(b)(1), unless the authorization is terminated or revoked.     Resp Syncytial Virus by PCR NEGATIVE NEGATIVE Final    Comment: (NOTE) Fact Sheet for Patients: bloggercourse.com  Fact Sheet for Healthcare Providers: seriousbroker.it  This test is not yet approved or cleared by the [place]  FDA and has been authorized for detection and/or diagnosis of SARS-CoV-2 by FDA under an Emergency Use Authorization (EUA). This EUA will remain in effect (meaning this test can be used) for the duration of the COVID-19 declaration under Section 564(b)(1) of the Act, 21 U.S.C.  section 360bbb-3(b)(1), unless the authorization is terminated or revoked.  Performed at [facility], [address] [place], [place] [address]      Radiology Studies: No results found.  Scheduled Meds:  escitalopram   20 mg Oral Daily   guaiFENesin   600 mg Oral BID   multivitamin with minerals  1 tablet Oral Daily   nicotine   14 mg Transdermal Daily   pantoprazole   40 mg Oral Daily   QUEtiapine   50 mg Oral QHS   rivaroxaban   10 mg Oral Q supper   Continuous Infusions:   LOS: 78 days   Norval Bar, MD  [facility]  01/06/2025, 2:08 PM   "

## 2025-01-06 NOTE — TOC Progression Note (Addendum)
 Transition of Care (TOC) - Progression Note    Patient Details  Name: Corey House. MRN: [id number] Date of Birth: 1965/12/28  Transition of Care [facility]) CM/SW Contact  Corey CHRISTELLA Ring, RN Phone Number: 01/06/2025, 3:43 PM  Clinical Narrative:    CM reached out to [facility] they have a 4399 A license as a mental health facility.  Spoke with employee on the phone she says to fax the referral and Corey House will review.  She says they do have 2 open beds.    Sent fax referral to [facility] [phone] Called [facility]- no answer [facility] - no answer [facility]- message left for return call [facility] - Message left for return call [facility] - they have one bed available, spoke with Corey House sent referral to [phone].  Corey House number is [phone].   Received a call back from [place] at [facility], he is interested in the referral and will have his daughter reach out with more information.     Expected Discharge Plan:  (patient is currently homeless, unsure of discharge location at this time) Barriers to Discharge: Financial Resources               Expected Discharge Plan and Services   Discharge Planning Services: CM Consult   Living arrangements for the past 2 months: Homeless Expected Discharge Date: 11/03/24                                     Social Drivers of Health (SDOH) Interventions SDOH Screenings   Food Insecurity: Food Insecurity Present (10/20/2024)  Housing: High Risk (10/21/2024)  Transportation Needs: Unmet Transportation Needs (10/20/2024)  Utilities: At Risk (10/20/2024)  Alcohol  Screen: Low Risk (04/23/2024)  Social Connections: Unknown (04/23/2024)  Tobacco Use: High Risk (10/21/2024)    Readmission Risk Interventions     No data to display

## 2025-01-07 DIAGNOSIS — J9622 Acute and chronic respiratory failure with hypercapnia: Secondary | ICD-10-CM | POA: Diagnosis not present

## 2025-01-07 DIAGNOSIS — J9621 Acute and chronic respiratory failure with hypoxia: Secondary | ICD-10-CM | POA: Diagnosis not present

## 2025-01-07 NOTE — Progress Notes (Signed)
 " PROGRESS NOTE    Corey House.  FMW:[id number] DOB: 01/29/1966 DOA: 10/20/2024 PCP: Patient, [name]  Subjective: No acute events overnight. Seen and examined at bedside. No new complaints. Tolerating oral intake without n/v. Denies constipation.   Hospital Course: Hospital course / significant events:   59 y.o. year old male with past medical history of  tobacco use disorder reporting a 41 year pack per year history, smoking ~1 pack per day since he was 59 years old. He denies any additional medical history though he has not seen a medical provider in ~20 years. He presents to [facility] ED with shortness of breath that began ~ 9 months ago and worsened ~3 months ago. He reports today his dyspnea is not significantly different than it was 3 months ago. He endorses a chronic cough that has been present since childhood. Her ports the cough is currently, minimally productive with white phlegm. Additionally, he endorses bilateral lower extremity swelling that began in the last 2 days, per his report (R) > (L). On exam the swelling is equal bilaterally. He endorses fatigue and dyspnea that is worse with position changes such as bending over and when standing from lying. He denies chest pain, palpitations, or fever.   ED Course: On arrival to [facility] ED patient was noted to be afebrile temp 36.8C, BP 142/111, HR 88, RR 22, SPO2 100% on 15L non-rebreather. Patient now on 2L via nasal cannula with SPO2 96%. CXR obtained and shows non specific findings of increased interstitial markings. CTA PE obtained and shows no PE, dilated RV and reflux of contract into the IVC and hepatic vein consistent with (R) heart failure, chronic bronchitis, and emphysema. US  DVT (B)LE negative for DVT. Labs notable Negative COVID, flu, RSV,for BNP 284.2, BUN 24, troponin 41--> 38 with no report of chest pain.  Lactic acid 0.9.  Blood cultures drawn and in process.  He was given Lasix , DuoNebs, and  Solu-Medrol .    Patient was treated in the hospital, condition has improved. See A/P belwo for problem based course. Currently just pending for placement.  CXR for TB r/o abn. CT chest with concern for pneumonia. 01/10 pt reported no increased SOB or coughing but more fatigued for few days. Started augmentin  + azithro, advised increased IS use and increased ambulation to encourage ventilation. CT images reviewed, PNA areas of concern lower posterior lungs with questionable persistent atelectasis / hypoventilation that may increase risk for infection. PPD negative. On 1/12, pt reported feeling bad with more SOB, fatigue, and jitteriness when xanax  wearing off. Switched augmentin  to IV rocephin , give 1L fluids, duonebs scheduled, steroids, held anoro. Lung exam no major concerns. 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 Clinically improved. Antibiotic course finished. Medically ready pending placement. 1/15.  TOC hopeful we can arrange placement for the patient soon. 1/17.  TOC will have to find another place for him.   Assessment and Plan:  Acute on chronic respiratory failure with hypoxia and hypercapnia (HCC) Multiple attempts were made to wean the patient from oxygen during the hospital course.  Pulse ox drops with ambulation on room air (down to 84% on 1/13).  Patient unable to tolerate BiPAP.  Currently on 2 L.  Will need placement.   Multifocal pneumonia Completed antibiotics 1/16.   Depression Seen by psychiatry this admission who recommended resumption of home meds which were titrated by hospitalist service as below: Increased  Seroquel  to 50 mg at night on 12/18/2024.  On Xanax  and Lexapro .  Added Wellbutrin  on 12/22.  Patient asked to come off the Wellbutrin  on 1/5 secondary to feeling nauseous. Wellbutrin  discontinued.   COPD with acute exacerbation (HCC) Treated during the  hospital course   Acute on chronic diastolic CHF (congestive heart failure) (HCC) EF 55 to 60%.  Off Lasix  at this time and stable   Nausea Resolved.  Patient believes this was secondary to Wellbutrin  which was discontinued.   Tobacco use disorder On NicoDerm patch   Headache Resolved.  Tylenol  as needed.  IV magnesium  given on 11/5.   Increased anion gap metabolic acidosis Resolved   Hypernatremia Resolved   Pulmonary nodules Will need to follow-up as outpatient.  Pulmonary referral placed.   Thrombocytosis Continue to watch intermittently   Protein-calorie malnutrition, severe Continue supplements  DVT prophylaxis: rivaroxaban  (XARELTO ) tablet 10 mg Start: 12/22/24 1700 rivaroxaban  (XARELTO ) tablet 10 mg  Xarelto    Code Status: Full Code  Disposition Plan: TBD Reason for continuing need for hospitalization: medically ready, homeless with new oxygen requirement, TOC team following   Objective: Vitals:   01/06/25 1954 01/07/25 0352 01/07/25 0452 01/07/25 0935  BP: 120/88 105/76  120/89  Pulse: 80 78  86  Resp: 18 15  16   Temp: 98.2 F (36.8 C) 98.3 F (36.8 C)  99 F (37.2 C)  TempSrc: Oral Oral    SpO2: 95% 91%  94%  Weight:   65 kg   Height:        Intake/Output Summary (Last 24 hours) at 01/07/2025 1247 Last data filed at 01/06/2025 1900 Gross per 24 hour  Intake 720 ml  Output --  Net 720 ml   Filed Weights   01/05/25 0500 01/06/25 0339 01/07/25 0452  Weight: 61.6 kg 64 kg 65 kg    Examination:  Physical Exam Vitals and nursing note reviewed.  Constitutional:      General: He is not in acute distress.    Appearance: He is ill-appearing (chronically).  HENT:     Head: Normocephalic and atraumatic.  Cardiovascular:     Rate and Rhythm: Normal rate and regular rhythm.     Pulses: Normal pulses.     Heart sounds: Normal heart sounds.  Pulmonary:     Effort: Pulmonary effort is normal.     Breath sounds: Normal breath sounds.  Abdominal:      General: Bowel sounds are normal.     Palpations: Abdomen is soft.  Neurological:     Mental Status: He is alert. Mental status is at baseline.     Data Reviewed: I have personally reviewed following labs and imaging studies  CBC: Recent Labs  Lab 01/06/25 0629  WBC 5.7  HGB 12.9*  HCT 40.1  MCV 91.3  PLT 278   Basic Metabolic Panel: Recent Labs  Lab 01/06/25 0629  NA 138  K 4.4  CL 102  CO2 29  GLUCOSE 101*  BUN 18  CREATININE 0.73  CALCIUM 8.7*   GFR: Estimated Creatinine Clearance: 85.3 mL/min (by C-G formula based on SCr of 0.73 mg/dL). Liver Function Tests: No results for input(s): AST, ALT, ALKPHOS, BILITOT, PROT, ALBUMIN in the last 168 hours. No results for input(s): LIPASE, AMYLASE in the last 168 hours. No results for input(s): AMMONIA in the last 168 hours. Coagulation Profile: No results for input(s): INR, PROTIME in the last 168 hours. Cardiac Enzymes: No results for input(s): CKTOTAL, CKMB, CKMBINDEX, TROPONINI in the last  168 hours. ProBNP, BNP (last 5 results) Recent Labs    10/20/24 1103 10/29/24 0608  PROBNP  --  <50.0  BNP 284.2*  --    HbA1C: No results for input(s): HGBA1C in the last 72 hours. CBG: No results for input(s): GLUCAP in the last 168 hours. Lipid Profile: No results for input(s): CHOL, HDL, LDLCALC, TRIG, CHOLHDL, LDLDIRECT in the last 72 hours. Thyroid Function Tests: No results for input(s): TSH, T4TOTAL, FREET4, T3FREE, THYROIDAB in the last 72 hours. Anemia Panel: No results for input(s): VITAMINB12, FOLATE, FERRITIN, TIBC, IRON, RETICCTPCT in the last 72 hours. Sepsis Labs: No results for input(s): PROCALCITON, LATICACIDVEN in the last 168 hours.  No results found for this or any previous visit (from the past 240 hours).   Radiology Studies: No results found.  Scheduled Meds:  escitalopram   20 mg Oral Daily   guaiFENesin   600 mg Oral  BID   multivitamin with minerals  1 tablet Oral Daily   nicotine   14 mg Transdermal Daily   pantoprazole   40 mg Oral Daily   QUEtiapine   50 mg Oral QHS   rivaroxaban   10 mg Oral Q supper   Continuous Infusions:   LOS: 79 days   Norval Bar, MD  [facility]  01/07/2025, 12:47 PM   "

## 2025-01-07 NOTE — TOC Progression Note (Signed)
 Transition of Care (TOC) - Progression Note    Patient Details  Name: Corey House. MRN: [id number] Date of Birth: 07-04-1966  Transition [facility]) CM/SW Contact  [name], RN Phone Number: 01/07/2025, 2:21 PM  Clinical Narrative:    Received a call from Zelvia with [employer], she would like to set up meeting via phone with Latoya.  CM provided her with Latoya's contact information.     Expected Discharge Plan:  (patient is currently homeless, unsure of discharge location at this time) Barriers to Discharge: Financial Resources               Expected Discharge Plan and Services   Discharge Planning Services: CM Consult   Living arrangements for the past 2 months: Homeless Expected Discharge Date: 11/03/24                                     Social Drivers of Health (SDOH) Interventions SDOH Screenings   Food Insecurity: Food Insecurity Present (10/20/2024)  Housing: High Risk (10/21/2024)  Transportation Needs: Unmet Transportation Needs (10/20/2024)  Utilities: At Risk (10/20/2024)  Alcohol  Screen: Low Risk (04/23/2024)  Social Connections: Unknown (04/23/2024)  Tobacco Use: High Risk (10/21/2024)    Readmission Risk Interventions     No data to display

## 2025-01-07 NOTE — Plan of Care (Signed)

## 2025-01-07 NOTE — TOC Progression Note (Signed)
 Transition of Care (TOC) - Progression Note    Patient Details  Name: Corey House. MRN: [id number] Date of Birth: November 28, 1966  [facility]) CM/SW Contact  [name], RN Phone Number: 01/07/2025, 8:55 AM  Clinical Narrative:    Received a call last night from Sharene Chancy from [facility], she says that they have several different homes with bed openings and one in particular that she thinks would be a good fit for this patient.  [facility] is a [address] but she wants to know if [facility] will fund supportive living a [address] non innovation home.  Messaged Zelvia this morning to see if this was possible.  Latoya- [phone]- [email or web address].    Expected Discharge Plan:  (patient is currently homeless, unsure of discharge location at this time) Barriers to Discharge: Financial Resources               Expected Discharge Plan and Services   Discharge Planning Services: CM Consult   Living arrangements for the past 2 months: Homeless Expected Discharge Date: 11/03/24                                     Social Drivers of Health (SDOH) Interventions SDOH Screenings   Food Insecurity: Food Insecurity Present (10/20/2024)  Housing: High Risk (10/21/2024)  Transportation Needs: Unmet Transportation Needs (10/20/2024)  Utilities: At Risk (10/20/2024)  Alcohol  Screen: Low Risk (04/23/2024)  Social Connections: Unknown (04/23/2024)  Tobacco Use: High Risk (10/21/2024)    Readmission Risk Interventions     No data to display

## 2025-01-08 DIAGNOSIS — J9622 Acute and chronic respiratory failure with hypercapnia: Secondary | ICD-10-CM | POA: Diagnosis not present

## 2025-01-08 DIAGNOSIS — J9621 Acute and chronic respiratory failure with hypoxia: Secondary | ICD-10-CM | POA: Diagnosis not present

## 2025-01-08 NOTE — Progress Notes (Signed)
 " PROGRESS NOTE    Corey House.  FMW:[id number] DOB: [birth year] DOA: 10/20/2024 PCP: Patient, [name]  Subjective: No acute events overnight. Seen and examined at bedside. No new complaints. Tolerating oral intake without n/v. Denies constipation.    Hospital Course: Hospital course / significant events:   59 y.o. year old male with past medical history of  tobacco use disorder reporting a 41 year pack per year history, smoking ~1 pack per day since he was 59 years old. He denies any additional medical history though he has not seen a medical provider in ~20 years. He presents to [facility] ED with shortness of breath that began ~ 9 months ago and worsened ~3 months ago. He reports today his dyspnea is not significantly different than it was 3 months ago. He endorses a chronic cough that has been present since childhood. Her ports the cough is currently, minimally productive with white phlegm. Additionally, he endorses bilateral lower extremity swelling that began in the last 2 days, per his report (R) > (L). On exam the swelling is equal bilaterally. He endorses fatigue and dyspnea that is worse with position changes such as bending over and when standing from lying. He denies chest pain, palpitations, or fever.   ED Course: On arrival to [facility] ED patient was noted to be afebrile temp 36.8C, BP 142/111, HR 88, RR 22, SPO2 100% on 15L non-rebreather. Patient now on 2L via nasal cannula with SPO2 96%. CXR obtained and shows non specific findings of increased interstitial markings. CTA PE obtained and shows no PE, dilated RV and reflux of contract into the IVC and hepatic vein consistent with (R) heart failure, chronic bronchitis, and emphysema. US  DVT (B)LE negative for DVT. Labs notable Negative COVID, flu, RSV,for BNP 284.2, BUN 24, troponin 41--> 38 with no report of chest pain.  Lactic acid 0.9.  Blood cultures drawn and in process.  He was given Lasix , DuoNebs, and  Solu-Medrol .    Patient was treated in the hospital, condition has improved. See A/P belwo for problem based course. Currently just pending for placement.  CXR for TB r/o abn. CT chest with concern for pneumonia. 01/10 pt reported no increased SOB or coughing but more fatigued for few days. Started augmentin  + azithro, advised increased IS use and increased ambulation to encourage ventilation. CT images reviewed, PNA areas of concern lower posterior lungs with questionable persistent atelectasis / hypoventilation that may increase risk for infection. PPD negative. On 1/12, pt reported feeling bad with more SOB, fatigue, and jitteriness when xanax  wearing off. Switched augmentin  to IV rocephin , give 1L fluids, duonebs scheduled, steroids, held anoro. Lung exam no major concerns. 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 Clinically improved. Antibiotic course finished. Medically ready pending placement. 1/15.  TOC hopeful we can arrange placement for the patient soon. 1/17.  TOC will have to find another place for him.   Assessment and Plan:  Acute on chronic respiratory failure with hypoxia and hypercapnia (HCC) Multiple attempts were made to wean the patient from oxygen during the hospital course.  Pulse ox drops with ambulation on room air (down to 84% on 1/13).  Patient unable to tolerate BiPAP.  Currently on 2 L.  Will need placement.   Multifocal pneumonia Completed antibiotics 1/16.   Depression Seen by psychiatry this admission who recommended resumption of home meds which were titrated by hospitalist service as below:  Increased Seroquel  to 50 mg at night on 12/18/2024.  On Xanax  and Lexapro .  Added Wellbutrin  on 12/22.  Patient asked to come off the Wellbutrin  on 1/5 secondary to feeling nauseous. Wellbutrin  discontinued.   COPD with acute exacerbation (HCC) Treated during the  hospital course   Acute on chronic diastolic CHF (congestive heart failure) (HCC) EF 55 to 60%.  Off Lasix  at this time and stable   Nausea Resolved.  Patient believes this was secondary to Wellbutrin  which was discontinued.   Tobacco use disorder On NicoDerm patch   Headache Resolved.  Tylenol  as needed.  IV magnesium  given on 11/5.   Increased anion gap metabolic acidosis Resolved   Hypernatremia Resolved   Pulmonary nodules Will need to follow-up as outpatient.  Pulmonary referral placed.   Thrombocytosis Continue to watch intermittently   Protein-calorie malnutrition, severe Continue supplements  DVT prophylaxis: rivaroxaban  (XARELTO ) tablet 10 mg Start: 12/22/24 1700 rivaroxaban  (XARELTO ) tablet 10 mg  Xarelto    Code Status: Full Code   Disposition Plan: TBD Reason for continuing need for hospitalization: medically ready, homeless with new oxygen requirement, TOC team following   Objective: Vitals:   01/07/25 2015 01/08/25 0601 01/08/25 0701 01/08/25 0802  BP: 105/84 101/74  111/75  Pulse: 84 79  79  Resp: 18 16  16   Temp: 98.3 F (36.8 C) 97.7 F (36.5 C)  97.8 F (36.6 C)  TempSrc: Oral     SpO2: 94% 95%  94%  Weight:   64.5 kg   Height:        Intake/Output Summary (Last 24 hours) at 01/08/2025 1414 Last data filed at 01/08/2025 1000 Gross per 24 hour  Intake 240 ml  Output --  Net 240 ml   Filed Weights   01/06/25 0339 01/07/25 0452 01/08/25 0701  Weight: 64 kg 65 kg 64.5 kg    Examination:  Physical Exam Vitals and nursing note reviewed.  Constitutional:      General: He is not in acute distress.    Appearance: He is ill-appearing.  HENT:     Head: Normocephalic and atraumatic.  Cardiovascular:     Rate and Rhythm: Normal rate and regular rhythm.     Pulses: Normal pulses.     Heart sounds: Normal heart sounds.  Pulmonary:     Effort: Pulmonary effort is normal. No respiratory distress.     Breath sounds: Normal breath sounds. No  wheezing.  Abdominal:     General: Bowel sounds are normal.     Palpations: Abdomen is soft.  Musculoskeletal:     Right lower leg: No edema.     Left lower leg: No edema.  Neurological:     Mental Status: He is alert.     Data Reviewed: I have personally reviewed following labs and imaging studies  CBC: Recent Labs  Lab 01/06/25 0629  WBC 5.7  HGB 12.9*  HCT 40.1  MCV 91.3  PLT 278   Basic Metabolic Panel: Recent Labs  Lab 01/06/25 0629  NA 138  K 4.4  CL 102  CO2 29  GLUCOSE 101*  BUN 18  CREATININE 0.73  CALCIUM 8.7*   GFR: Estimated Creatinine Clearance: 85.3 mL/min (by C-G formula based on SCr of 0.73 mg/dL). Liver Function Tests: No results for input(s): AST, ALT, ALKPHOS, BILITOT, PROT, ALBUMIN in the last 168 hours. No results for input(s): LIPASE, AMYLASE in the last 168 hours. No results for input(s): AMMONIA in the last 168 hours. Coagulation Profile: No results for  input(s): INR, PROTIME in the last 168 hours. Cardiac Enzymes: No results for input(s): CKTOTAL, CKMB, CKMBINDEX, TROPONINI in the last 168 hours. ProBNP, BNP (last 5 results) Recent Labs    10/20/24 1103 10/29/24 0608  PROBNP  --  <50.0  BNP 284.2*  --    HbA1C: No results for input(s): HGBA1C in the last 72 hours. CBG: No results for input(s): GLUCAP in the last 168 hours. Lipid Profile: No results for input(s): CHOL, HDL, LDLCALC, TRIG, CHOLHDL, LDLDIRECT in the last 72 hours. Thyroid Function Tests: No results for input(s): TSH, T4TOTAL, FREET4, T3FREE, THYROIDAB in the last 72 hours. Anemia Panel: No results for input(s): VITAMINB12, FOLATE, FERRITIN, TIBC, IRON, RETICCTPCT in the last 72 hours. Sepsis Labs: No results for input(s): PROCALCITON, LATICACIDVEN in the last 168 hours.  No results found for this or any previous visit (from the past 240 hours).   Radiology Studies: No results  found.  Scheduled Meds:  escitalopram   20 mg Oral Daily   guaiFENesin   600 mg Oral BID   multivitamin with minerals  1 tablet Oral Daily   nicotine   14 mg Transdermal Daily   pantoprazole   40 mg Oral Daily   QUEtiapine   50 mg Oral QHS   rivaroxaban   10 mg Oral Q supper   Continuous Infusions:   LOS: 80 days   Norval Bar, MD  [facility]  01/08/2025, 2:14 PM   "

## 2025-01-08 NOTE — Plan of Care (Signed)
" °  Problem: Clinical Measurements: Goal: Ability to maintain clinical measurements within normal limits will improve Outcome: Progressing   Problem: Activity: Goal: Risk for activity intolerance will decrease Outcome: Progressing   Problem: Safety: Goal: Ability to remain free from injury will improve Outcome: Progressing   Problem: Activity: Goal: Ability to tolerate increased activity will improve Outcome: Progressing   "

## 2025-01-08 NOTE — Plan of Care (Signed)

## 2025-01-09 DIAGNOSIS — J9622 Acute and chronic respiratory failure with hypercapnia: Secondary | ICD-10-CM | POA: Diagnosis not present

## 2025-01-09 DIAGNOSIS — J9621 Acute and chronic respiratory failure with hypoxia: Secondary | ICD-10-CM | POA: Diagnosis not present

## 2025-01-09 NOTE — Plan of Care (Signed)
" °  Problem: Clinical Measurements: Goal: Ability to maintain clinical measurements within normal limits will improve Outcome: Progressing Goal: Respiratory complications will improve Outcome: Progressing   Problem: Coping: Goal: Level of anxiety will decrease Outcome: Progressing   Problem: Safety: Goal: Ability to remain free from injury will improve Outcome: Progressing   Problem: Activity: Goal: Ability to tolerate increased activity will improve Outcome: Progressing   "

## 2025-01-09 NOTE — Progress Notes (Signed)
 " PROGRESS NOTE    Corey House.  FMW:[id number] DOB: March 17, 1966 DOA: 10/20/2024 PCP: Patient, [name]  Subjective: No acute events overnight. Seen and examined at bedside. No new complaints. Tolerating oral intake without n/v. Denies constipation.    Hospital Course: Hospital course / significant events:   59 y.o. year old male with past medical history of  tobacco use disorder reporting a 41 year pack per year history, smoking ~1 pack per day since he was 59 years old. He denies any additional medical history though he has not seen a medical provider in ~20 years. He presents to [facility] ED with shortness of breath that began ~ 9 months ago and worsened ~3 months ago. He reports today his dyspnea is not significantly different than it was 3 months ago. He endorses a chronic cough that has been present since childhood. Her ports the cough is currently, minimally productive with white phlegm. Additionally, he endorses bilateral lower extremity swelling that began in the last 2 days, per his report (R) > (L). On exam the swelling is equal bilaterally. He endorses fatigue and dyspnea that is worse with position changes such as bending over and when standing from lying. He denies chest pain, palpitations, or fever.   ED Course: On arrival to [facility] ED patient was noted to be afebrile temp 36.8C, BP 142/111, HR 88, RR 22, SPO2 100% on 15L non-rebreather. Patient now on 2L via nasal cannula with SPO2 96%. CXR obtained and shows non specific findings of increased interstitial markings. CTA PE obtained and shows no PE, dilated RV and reflux of contract into the IVC and hepatic vein consistent with (R) heart failure, chronic bronchitis, and emphysema. US  DVT (B)LE negative for DVT. Labs notable Negative COVID, flu, RSV,for BNP 284.2, BUN 24, troponin 41--> 38 with no report of chest pain.  Lactic acid 0.9.  Blood cultures drawn and in process.  He was given Lasix , DuoNebs, and  Solu-Medrol .    Patient was treated in the hospital, condition has improved. See A/P belwo for problem based course. Currently just pending for placement.  CXR for TB r/o abn. CT chest with concern for pneumonia. 01/10 pt reported no increased SOB or coughing but more fatigued for few days. Started augmentin  + azithro, advised increased IS use and increased ambulation to encourage ventilation. CT images reviewed, PNA areas of concern lower posterior lungs with questionable persistent atelectasis / hypoventilation that may increase risk for infection. PPD negative. On 1/12, pt reported feeling bad with more SOB, fatigue, and jitteriness when xanax  wearing off. Switched augmentin  to IV rocephin , give 1L fluids, duonebs scheduled, steroids, held anoro. Lung exam no major concerns. 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 Clinically improved. Antibiotic course finished. Medically ready pending placement. 1/15.  TOC hopeful we can arrange placement for the patient soon. 1/17.  TOC will have to find another place for him.   Assessment and Plan:  Acute on chronic respiratory failure with hypoxia and hypercapnia (HCC) Multiple attempts were made to wean the patient from oxygen during the hospital course.  Pulse ox drops with ambulation on room air (down to 84% on 1/13).  Patient unable to tolerate BiPAP.  Currently on 2 L.  Will need placement.   Multifocal pneumonia Completed antibiotics 1/16.   Depression Seen by psychiatry this admission who recommended resumption of home meds which were titrated by hospitalist service as below:  Increased Seroquel  to 50 mg at night on 12/18/2024.  On Xanax  and Lexapro .  Added Wellbutrin  on 12/22.  Patient asked to come off the Wellbutrin  on 1/5 secondary to feeling nauseous. Wellbutrin  discontinued.   COPD with acute exacerbation (HCC) Treated during the  hospital course   Acute on chronic diastolic CHF (congestive heart failure) (HCC) EF 55 to 60%.  Off Lasix  at this time and stable   Nausea Resolved.  Patient believes this was secondary to Wellbutrin  which was discontinued.   Tobacco use disorder On NicoDerm patch   Headache Resolved.  Tylenol  as needed.  IV magnesium  given on 11/5.   Increased anion gap metabolic acidosis Resolved   Hypernatremia Resolved   Pulmonary nodules Will need to follow-up as outpatient.  Pulmonary referral placed.   Thrombocytosis Continue to watch intermittently   Protein-calorie malnutrition, severe Continue supplements  DVT prophylaxis: rivaroxaban  (XARELTO ) tablet 10 mg Start: 12/22/24 1700 rivaroxaban  (XARELTO ) tablet 10 mg  Xarelto    Code Status: Full Code  Disposition Plan: TBD Reason for continuing need for hospitalization: medically ready, homeless with new oxygen requirement, TOC team following   Objective: Vitals:   01/08/25 1606 01/08/25 1935 01/09/25 0311 01/09/25 0750  BP: 114/79 123/77 109/75 100/79  Pulse: 80 83 86 85  Resp: 18 17  16   Temp: 98.1 F (36.7 C) 98.3 F (36.8 C) 97.7 F (36.5 C) 97.9 F (36.6 C)  TempSrc:  Tympanic Oral   SpO2: 95% 95% 95% 96%  Weight:   66.8 kg   Height:        Intake/Output Summary (Last 24 hours) at 01/09/2025 1041 Last data filed at 01/08/2025 1900 Gross per 24 hour  Intake 480 ml  Output --  Net 480 ml   Filed Weights   01/07/25 0452 01/08/25 0701 01/09/25 0311  Weight: 65 kg 64.5 kg 66.8 kg    Examination:  Physical Exam Vitals and nursing note reviewed.  Constitutional:      General: He is not in acute distress.    Appearance: He is ill-appearing (chronically).  HENT:     Head: Normocephalic and atraumatic.  Cardiovascular:     Rate and Rhythm: Normal rate and regular rhythm.     Pulses: Normal pulses.     Heart sounds: Normal heart sounds.  Pulmonary:     Effort: Pulmonary effort is normal.     Breath  sounds: Normal breath sounds.  Abdominal:     General: Bowel sounds are normal.     Palpations: Abdomen is soft.  Neurological:     Mental Status: He is alert.     Data Reviewed: I have personally reviewed following labs and imaging studies  CBC: Recent Labs  Lab 01/06/25 0629  WBC 5.7  HGB 12.9*  HCT 40.1  MCV 91.3  PLT 278   Basic Metabolic Panel: Recent Labs  Lab 01/06/25 0629  NA 138  K 4.4  CL 102  CO2 29  GLUCOSE 101*  BUN 18  CREATININE 0.73  CALCIUM 8.7*   GFR: Estimated Creatinine Clearance: 85.3 mL/min (by C-G formula based on SCr of 0.73 mg/dL). Liver Function Tests: No results for input(s): AST, ALT, ALKPHOS, BILITOT, PROT, ALBUMIN in the last 168 hours. No results for input(s): LIPASE, AMYLASE in the last 168 hours. No results for input(s): AMMONIA in the last 168 hours. Coagulation Profile: No results for input(s): INR, PROTIME in the last 168 hours. Cardiac Enzymes: No results for input(s): CKTOTAL, CKMB, CKMBINDEX, TROPONINI in the last 168  hours. ProBNP, BNP (last 5 results) Recent Labs    10/20/24 1103 10/29/24 0608  PROBNP  --  <50.0  BNP 284.2*  --    HbA1C: No results for input(s): HGBA1C in the last 72 hours. CBG: No results for input(s): GLUCAP in the last 168 hours. Lipid Profile: No results for input(s): CHOL, HDL, LDLCALC, TRIG, CHOLHDL, LDLDIRECT in the last 72 hours. Thyroid Function Tests: No results for input(s): TSH, T4TOTAL, FREET4, T3FREE, THYROIDAB in the last 72 hours. Anemia Panel: No results for input(s): VITAMINB12, FOLATE, FERRITIN, TIBC, IRON, RETICCTPCT in the last 72 hours. Sepsis Labs: No results for input(s): PROCALCITON, LATICACIDVEN in the last 168 hours.  No results found for this or any previous visit (from the past 240 hours).   Radiology Studies: No results found.  Scheduled Meds:  escitalopram   20 mg Oral Daily   guaiFENesin    600 mg Oral BID   multivitamin with minerals  1 tablet Oral Daily   nicotine   14 mg Transdermal Daily   pantoprazole   40 mg Oral Daily   QUEtiapine   50 mg Oral QHS   rivaroxaban   10 mg Oral Q supper   Continuous Infusions:   LOS: 81 days   Norval Bar, MD  [facility]  01/09/2025, 10:41 AM   "

## 2025-01-09 NOTE — Plan of Care (Signed)

## 2025-01-10 DIAGNOSIS — J9622 Acute and chronic respiratory failure with hypercapnia: Secondary | ICD-10-CM | POA: Diagnosis not present

## 2025-01-10 DIAGNOSIS — J9621 Acute and chronic respiratory failure with hypoxia: Secondary | ICD-10-CM | POA: Diagnosis not present

## 2025-01-10 NOTE — Progress Notes (Signed)
SATURATION QUALIFICATIONS: (This note is used to comply with regulatory documentation for home oxygen)  Patient Saturations on Room Air at Rest 84 %   

## 2025-01-10 NOTE — Progress Notes (Signed)
 " PROGRESS NOTE    Corey House Corey House.  FMW:[id number] DOB: 02/13/1966 DOA: 10/20/2024 PCP: Patient, [name]  Subjective: No acute events overnight. Seen and examined at bedside. No new complaints. Tolerating oral intake without n/v. Denies constipation.     Hospital Course: Hospital course / significant events:   59 y.o. year old male with past medical history of  tobacco use disorder reporting a 41 year pack per year history, smoking ~1 pack per day since he was 59 years old. He denies any additional medical history though he has not seen a medical provider in ~20 years. He presents to [facility] ED with shortness of breath that began ~ 9 months ago and worsened ~3 months ago. He reports today his dyspnea is not significantly different than it was 3 months ago. He endorses a chronic cough that has been present since childhood. Her ports the cough is currently, minimally productive with white phlegm. Additionally, he endorses bilateral lower extremity swelling that began in the last 2 days, per his report (R) > (L). On exam the swelling is equal bilaterally. He endorses fatigue and dyspnea that is worse with position changes such as bending over and when standing from lying. He denies chest pain, palpitations, or fever.   ED Course: On arrival to [facility] ED patient was noted to be afebrile temp 36.8C, BP 142/111, HR 88, RR 22, SPO2 100% on 15L non-rebreather. Patient now on 2L via nasal cannula with SPO2 96%. CXR obtained and shows non specific findings of increased interstitial markings. CTA PE obtained and shows no PE, dilated RV and reflux of contract into the IVC and hepatic vein consistent with (R) heart failure, chronic bronchitis, and emphysema. US  DVT (B)LE negative for DVT. Labs notable Negative COVID, flu, RSV,for BNP 284.2, BUN 24, troponin 41--> 38 with no report of chest pain.  Lactic acid 0.9.  Blood cultures drawn and in process.  He was given Lasix , DuoNebs, and  Solu-Medrol .    Patient was treated in the hospital, condition has improved. See A/P belwo for problem based course. Currently just pending for placement.  CXR for TB r/o abn. CT chest with concern for pneumonia. 01/10 pt reported no increased SOB or coughing but more fatigued for few days. Started augmentin  + azithro, advised increased IS use and increased ambulation to encourage ventilation. CT images reviewed, PNA areas of concern lower posterior lungs with questionable persistent atelectasis / hypoventilation that may increase risk for infection. PPD negative. On 1/12, pt reported feeling bad with more SOB, fatigue, and jitteriness when xanax  wearing off. Switched augmentin  to IV rocephin , give 1L fluids, duonebs scheduled, steroids, held anoro. Lung exam no major concerns. 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 Clinically improved. Antibiotic course finished. Medically ready pending placement. 1/15.  TOC hopeful we can arrange placement for the patient soon. 1/17.  TOC will have to find another place for him.   Assessment and Plan:  Acute on chronic respiratory failure with hypoxia and hypercapnia (HCC) Multiple attempts were made to wean the patient from oxygen during the hospital course.  Pulse ox drops with ambulation on room air (down to 84% on 1/13).  Patient unable to tolerate BiPAP.  Currently on 2 L.  Will need placement.   Multifocal pneumonia Completed antibiotics 1/16.   Depression Seen by psychiatry this admission who recommended resumption of home meds which were titrated by hospitalist service as  below: Increased Seroquel  to 50 mg at night on 12/18/2024.  On Xanax  and Lexapro .  Added Wellbutrin  on 12/22.  Patient asked to come off the Wellbutrin  on 1/5 secondary to feeling nauseous. Wellbutrin  discontinued.   COPD with acute exacerbation (HCC) Treated during the  hospital course   Acute on chronic diastolic CHF (congestive heart failure) (HCC) EF 55 to 60%.  Off Lasix  at this time and stable   Nausea Resolved.  Patient believes this was secondary to Wellbutrin  which was discontinued.   Tobacco use disorder On NicoDerm patch   Headache Resolved.  Tylenol  as needed.  IV magnesium  given on 11/5.   Increased anion gap metabolic acidosis Resolved   Hypernatremia Resolved   Pulmonary nodules Will need to follow-up as outpatient.  Pulmonary referral placed.   Thrombocytosis Continue to watch intermittently   Protein-calorie malnutrition, severe Continue supplements  DVT prophylaxis: rivaroxaban  (XARELTO ) tablet 10 mg Start: 12/22/24 1700 rivaroxaban  (XARELTO ) tablet 10 mg  Xarelto    Code Status: Full Code  Disposition Plan: TBD Reason for continuing need for hospitalization: medically ready, homeless with new oxygen requirement, TOC team following   Objective: Vitals:   01/09/25 2011 01/10/25 0449 01/10/25 0500 01/10/25 0939  BP: (!) 119/90 105/77  111/84  Pulse: 81 85  91  Resp: 16 11  20   Temp: 98.1 F (36.7 C) 97.7 F (36.5 C)    TempSrc:      SpO2: 95% 95%  92%  Weight:   66.8 kg   Height:       No intake or output data in the 24 hours ending 01/10/25 1031 Filed Weights   01/08/25 0701 01/09/25 0311 01/10/25 0500  Weight: 64.5 kg 66.8 kg 66.8 kg    Examination:  Physical Exam Vitals and nursing note reviewed.  Constitutional:      General: He is not in acute distress.    Appearance: He is ill-appearing.  HENT:     Head: Normocephalic and atraumatic.  Cardiovascular:     Rate and Rhythm: Normal rate and regular rhythm.     Pulses: Normal pulses.     Heart sounds: Normal heart sounds.  Pulmonary:     Effort: Pulmonary effort is normal.     Breath sounds: Normal breath sounds.  Abdominal:     General: Bowel sounds are normal.     Palpations: Abdomen is soft.  Neurological:     Mental Status: He is alert.      Data Reviewed: I have personally reviewed following labs and imaging studies  CBC: Recent Labs  Lab 01/06/25 0629  WBC 5.7  HGB 12.9*  HCT 40.1  MCV 91.3  PLT 278   Basic Metabolic Panel: Recent Labs  Lab 01/06/25 0629  NA 138  K 4.4  CL 102  CO2 29  GLUCOSE 101*  BUN 18  CREATININE 0.73  CALCIUM 8.7*   GFR: Estimated Creatinine Clearance: 85.3 mL/min (by C-G formula based on SCr of 0.73 mg/dL). Liver Function Tests: No results for input(s): AST, ALT, ALKPHOS, BILITOT, PROT, ALBUMIN in the last 168 hours. No results for input(s): LIPASE, AMYLASE in the last 168 hours. No results for input(s): AMMONIA in the last 168 hours. Coagulation Profile: No results for input(s): INR, PROTIME in the last 168 hours. Cardiac Enzymes: No results for input(s): CKTOTAL, CKMB, CKMBINDEX, TROPONINI in the last 168 hours. ProBNP, BNP (last 5 results) Recent Labs    10/20/24 1103 10/29/24 0608  PROBNP  --  <50.0  BNP 284.2*  --  HbA1C: No results for input(s): HGBA1C in the last 72 hours. CBG: No results for input(s): GLUCAP in the last 168 hours. Lipid Profile: No results for input(s): CHOL, HDL, LDLCALC, TRIG, CHOLHDL, LDLDIRECT in the last 72 hours. Thyroid Function Tests: No results for input(s): TSH, T4TOTAL, FREET4, T3FREE, THYROIDAB in the last 72 hours. Anemia Panel: No results for input(s): VITAMINB12, FOLATE, FERRITIN, TIBC, IRON, RETICCTPCT in the last 72 hours. Sepsis Labs: No results for input(s): PROCALCITON, LATICACIDVEN in the last 168 hours.  No results found for this or any previous visit (from the past 240 hours).   Radiology Studies: No results found.  Scheduled Meds:  escitalopram   20 mg Oral Daily   guaiFENesin   600 mg Oral BID   multivitamin with minerals  1 tablet Oral Daily   nicotine   14 mg Transdermal Daily   pantoprazole   40 mg Oral Daily   QUEtiapine   50 mg Oral  QHS   rivaroxaban   10 mg Oral Q supper   Continuous Infusions:   LOS: 82 days   Norval Bar, MD  [facility]  01/10/2025, 10:31 AM   "

## 2025-01-10 NOTE — Plan of Care (Signed)
" °  Problem: Health Behavior/Discharge Planning: Goal: Ability to manage health-related needs will improve Outcome: Progressing   Problem: Clinical Measurements: Goal: Will remain free from infection Outcome: Progressing   Problem: Activity: Goal: Risk for activity intolerance will decrease Outcome: Progressing   Problem: Pain Managment: Goal: General experience of comfort will improve and/or be controlled Outcome: Progressing   Problem: Education: Goal: Individualized Educational Video(s) Outcome: Progressing   Problem: Respiratory: Goal: Ability to maintain adequate ventilation will improve Outcome: Progressing   "

## 2025-01-11 NOTE — Progress Notes (Signed)
 " PROGRESS NOTE    Corey House.  FMW:[id number] DOB: 13-Jan-1966 DOA: 10/20/2024 PCP: Patient, [name]  Subjective: No acute events overnight. Seen and examined at bedside. No new complaints. Tolerating oral intake without n/v. Denies constipation.    Hospital Course: Hospital course / significant events:   59 y.o. year old male with past medical history of  tobacco use disorder reporting a 41 year pack per year history, smoking ~1 pack per day since he was 59 years old. He denies any additional medical history though he has not seen a medical provider in ~20 years. He presents to [facility] ED with shortness of breath that began ~ 9 months ago and worsened ~3 months ago. He reports today his dyspnea is not significantly different than it was 3 months ago. He endorses a chronic cough that has been present since childhood. Her ports the cough is currently, minimally productive with white phlegm. Additionally, he endorses bilateral lower extremity swelling that began in the last 2 days, per his report (R) > (L). On exam the swelling is equal bilaterally. He endorses fatigue and dyspnea that is worse with position changes such as bending over and when standing from lying. He denies chest pain, palpitations, or fever.   ED Course: On arrival to [facility] ED patient was noted to be afebrile temp 36.8C, BP 142/111, HR 88, RR 22, SPO2 100% on 15L non-rebreather. Patient now on 2L via nasal cannula with SPO2 96%. CXR obtained and shows non specific findings of increased interstitial markings. CTA PE obtained and shows no PE, dilated RV and reflux of contract into the IVC and hepatic vein consistent with (R) heart failure, chronic bronchitis, and emphysema. US  DVT (B)LE negative for DVT. Labs notable Negative COVID, flu, RSV,for BNP 284.2, BUN 24, troponin 41--> 38 with no report of chest pain.  Lactic acid 0.9.  Blood cultures drawn and in process.  He was given Lasix , DuoNebs, and  Solu-Medrol .    Patient was treated in the hospital, condition has improved. See A/P belwo for problem based course. Currently just pending for placement.  CXR for TB r/o abn. CT chest with concern for pneumonia. 01/10 pt reported no increased SOB or coughing but more fatigued for few days. Started augmentin  + azithro, advised increased IS use and increased ambulation to encourage ventilation. CT images reviewed, PNA areas of concern lower posterior lungs with questionable persistent atelectasis / hypoventilation that may increase risk for infection. PPD negative. On 1/12, pt reported feeling bad with more SOB, fatigue, and jitteriness when xanax  wearing off. Switched augmentin  to IV rocephin , give 1L fluids, duonebs scheduled, steroids, held anoro. Lung exam no major concerns. 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 Clinically improved. Antibiotic course finished. Medically ready pending placement. 1/15.  TOC hopeful we can arrange placement for the patient soon. 1/17.  TOC will have to find another place for him.   Assessment and Plan:  Acute on chronic respiratory failure with hypoxia and hypercapnia (HCC) Multiple attempts were made to wean the patient from oxygen during the hospital course.  Pulse ox drops with ambulation on room air (down to 84% on 1/13).  Patient unable to tolerate BiPAP.  Currently on 2 L.  Will need placement.   Multifocal pneumonia Completed antibiotics 1/16.   Depression Seen by psychiatry this admission who recommended resumption of home meds which were titrated by hospitalist service as below:  Increased Seroquel  to 50 mg at night on 12/18/2024.  On Xanax  and Lexapro .  Added Wellbutrin  on 12/22.  Patient asked to come off the Wellbutrin  on 1/5 secondary to feeling nauseous. Wellbutrin  discontinued.   COPD with acute exacerbation (HCC) Treated during the  hospital course   Acute on chronic diastolic CHF (congestive heart failure) (HCC) EF 55 to 60%.  Off Lasix  at this time and stable   Nausea Resolved.  Patient believes this was secondary to Wellbutrin  which was discontinued.   Tobacco use disorder On NicoDerm patch   Headache Resolved.  Tylenol  as needed.  IV magnesium  given on 11/5.   Increased anion gap metabolic acidosis Resolved   Hypernatremia Resolved   Pulmonary nodules Will need to follow-up as outpatient.  Pulmonary referral placed.   Thrombocytosis Continue to watch intermittently   Protein-calorie malnutrition, severe Continue supplements  DVT prophylaxis: rivaroxaban  (XARELTO ) tablet 10 mg Start: 12/22/24 1700 rivaroxaban  (XARELTO ) tablet 10 mg  Xarelto    Code Status: Full Code  Disposition Plan: TBD Reason for continuing need for hospitalization: medically ready, homeless with new oxygen requirement, TOC team following   Objective: Vitals:   01/10/25 1616 01/10/25 2041 01/11/25 0503 01/11/25 0745  BP: 129/86 103/85 106/78 118/84  Pulse: 87 78 86 (!) 106  Resp: 16 18 17 19   Temp: 97.9 F (36.6 C) (!) 97.4 F (36.3 C) 97.9 F (36.6 C) 98.8 F (37.1 C)  TempSrc:   Oral   SpO2: 92% 93% 91% 93%  Weight:      Height:       No intake or output data in the 24 hours ending 01/11/25 1226 Filed Weights   01/08/25 0701 01/09/25 0311 01/10/25 0500  Weight: 64.5 kg 66.8 kg 66.8 kg    Examination:  Physical Exam Vitals and nursing note reviewed.  Constitutional:      General: He is not in acute distress.    Appearance: He is ill-appearing (chronically).  Cardiovascular:     Rate and Rhythm: Normal rate and regular rhythm.     Pulses: Normal pulses.     Heart sounds: Normal heart sounds.  Pulmonary:     Effort: Pulmonary effort is normal.     Breath sounds: Normal breath sounds.  Abdominal:     General: Bowel sounds are normal.     Palpations: Abdomen is soft.  Neurological:     Mental Status:  He is alert.     Data Reviewed: I have personally reviewed following labs and imaging studies  CBC: Recent Labs  Lab 01/06/25 0629  WBC 5.7  HGB 12.9*  HCT 40.1  MCV 91.3  PLT 278   Basic Metabolic Panel: Recent Labs  Lab 01/06/25 0629  NA 138  K 4.4  CL 102  CO2 29  GLUCOSE 101*  BUN 18  CREATININE 0.73  CALCIUM 8.7*   GFR: Estimated Creatinine Clearance: 85.3 mL/min (by C-G formula based on SCr of 0.73 mg/dL). Liver Function Tests: No results for input(s): AST, ALT, ALKPHOS, BILITOT, PROT, ALBUMIN in the last 168 hours. No results for input(s): LIPASE, AMYLASE in the last 168 hours. No results for input(s): AMMONIA in the last 168 hours. Coagulation Profile: No results for input(s): INR, PROTIME in the last 168 hours. Cardiac Enzymes: No results for input(s): CKTOTAL, CKMB, CKMBINDEX, TROPONINI in the last 168 hours. ProBNP, BNP (last 5 results) Recent Labs    10/20/24 1103 10/29/24 0608  PROBNP  --  <50.0  BNP 284.2*  --  HbA1C: No results for input(s): HGBA1C in the last 72 hours. CBG: No results for input(s): GLUCAP in the last 168 hours. Lipid Profile: No results for input(s): CHOL, HDL, LDLCALC, TRIG, CHOLHDL, LDLDIRECT in the last 72 hours. Thyroid Function Tests: No results for input(s): TSH, T4TOTAL, FREET4, T3FREE, THYROIDAB in the last 72 hours. Anemia Panel: No results for input(s): VITAMINB12, FOLATE, FERRITIN, TIBC, IRON, RETICCTPCT in the last 72 hours. Sepsis Labs: No results for input(s): PROCALCITON, LATICACIDVEN in the last 168 hours.  No results found for this or any previous visit (from the past 240 hours).   Radiology Studies: No results found.  Scheduled Meds:  escitalopram   20 mg Oral Daily   guaiFENesin   600 mg Oral BID   multivitamin with minerals  1 tablet Oral Daily   nicotine   14 mg Transdermal Daily   pantoprazole   40 mg Oral Daily    QUEtiapine   50 mg Oral QHS   rivaroxaban   10 mg Oral Q supper   Continuous Infusions:   LOS: 83 days   Norval Bar, MD  [facility]  01/11/2025, 12:26 PM   "

## 2025-01-11 NOTE — Plan of Care (Signed)

## 2025-01-11 NOTE — Plan of Care (Signed)
   Problem: Education: Goal: Knowledge of General Education information will improve Description: Including pain rating scale, medication(s)/side effects and non-pharmacologic comfort measures Outcome: Not Progressing   Problem: Health Behavior/Discharge Planning: Goal: Ability to manage health-related needs will improve Outcome: Not Progressing   Problem: Clinical Measurements: Goal: Ability to maintain clinical measurements within normal limits will improve Outcome: Not Progressing Goal: Will remain free from infection Outcome: Not Progressing Goal: Diagnostic test results will improve Outcome: Not Progressing Goal: Respiratory complications will improve Outcome: Not Progressing Goal: Cardiovascular complication will be avoided Outcome: Not Progressing   Problem: Activity: Goal: Risk for activity intolerance will decrease Outcome: Not Progressing   Problem: Nutrition: Goal: Adequate nutrition will be maintained Outcome: Not Progressing   Problem: Coping: Goal: Level of anxiety will decrease Outcome: Not Progressing   Problem: Elimination: Goal: Will not experience complications related to bowel motility Outcome: Not Progressing Goal: Will not experience complications related to urinary retention Outcome: Not Progressing   Problem: Pain Managment: Goal: General experience of comfort will improve and/or be controlled Outcome: Not Progressing   Problem: Safety: Goal: Ability to remain free from injury will improve Outcome: Not Progressing   Problem: Skin Integrity: Goal: Risk for impaired skin integrity will decrease Outcome: Not Progressing   Problem: Education: Goal: Knowledge of disease or condition will improve Outcome: Not Progressing Goal: Knowledge of the prescribed therapeutic regimen will improve Outcome: Not Progressing Goal: Individualized Educational Video(s) Outcome: Not Progressing   Problem: Activity: Goal: Ability to tolerate increased  activity will improve Outcome: Not Progressing Goal: Will verbalize the importance of balancing activity with adequate rest periods Outcome: Not Progressing   Problem: Respiratory: Goal: Ability to maintain a clear airway will improve Outcome: Not Progressing Goal: Levels of oxygenation will improve Outcome: Not Progressing Goal: Ability to maintain adequate ventilation will improve Outcome: Not Progressing   Problem: Activity: Goal: Ability to tolerate increased activity will improve Outcome: Not Progressing   Problem: Clinical Measurements: Goal: Ability to maintain a body temperature in the normal range will improve Outcome: Not Progressing   Problem: Respiratory: Goal: Ability to maintain adequate ventilation will improve Outcome: Not Progressing Goal: Ability to maintain a clear airway will improve Outcome: Not Progressing

## 2025-01-11 NOTE — TOC Progression Note (Signed)
 Transition of Care (TOC) - Progression Note    Patient Details  Name: Corey House. MRN: [id number] Date of Birth: 1966-06-17  Transition of Care [facility]) CM/SW Contact  [name], RN Phone Number: 01/11/2025, 3:14 PM  Clinical Narrative:    Received an update from Zelvia, [place] with [employer] completed the 1915 I assessment to see if they can try for ITS funds and get a bed with [facility].    Expected Discharge Plan:  (patient is currently homeless, unsure of discharge location at this time) Barriers to Discharge: Financial Resources               Expected Discharge Plan and Services   Discharge Planning Services: CM Consult   Living arrangements for the past 2 months: Homeless Expected Discharge Date: 11/03/24                                     Social Drivers of Health (SDOH) Interventions SDOH Screenings   Food Insecurity: Food Insecurity Present (10/20/2024)  Housing: High Risk (10/21/2024)  Transportation Needs: Unmet Transportation Needs (10/20/2024)  Utilities: At Risk (10/20/2024)  Alcohol  Screen: Low Risk (04/23/2024)  Social Connections: Unknown (04/23/2024)  Tobacco Use: High Risk (10/21/2024)    Readmission Risk Interventions     No data to display

## 2025-01-12 LAB — RESPIRATORY PANEL BY PCR

## 2025-01-12 NOTE — Progress Notes (Signed)
 " PROGRESS NOTE    Corey House.  FMW:[id number] DOB: 16-Aug-1966 DOA: 10/20/2024 PCP: Patient, [name]  Subjective: No acute events overnight. Seen and examined at bedside. No new complaints. Tolerating oral intake without n/v. Denies constipation.    Hospital Course: Hospital course / significant events:   59 y.o. year old male with past medical history of  tobacco use disorder reporting a 41 year pack per year history, smoking ~1 pack per day since he was 59 years old. He denies any additional medical history though he has not seen a medical provider in ~20 years. He presents to [facility] ED with shortness of breath that began ~ 9 months ago and worsened ~3 months ago. He reports today his dyspnea is not significantly different than it was 3 months ago. He endorses a chronic cough that has been present since childhood. Her ports the cough is currently, minimally productive with white phlegm. Additionally, he endorses bilateral lower extremity swelling that began in the last 2 days, per his report (R) > (L). On exam the swelling is equal bilaterally. He endorses fatigue and dyspnea that is worse with position changes such as bending over and when standing from lying. He denies chest pain, palpitations, or fever.   ED Course: On arrival to [facility] ED patient was noted to be afebrile temp 36.8C, BP 142/111, HR 88, RR 22, SPO2 100% on 15L non-rebreather. Patient now on 2L via nasal cannula with SPO2 96%. CXR obtained and shows non specific findings of increased interstitial markings. CTA PE obtained and shows no PE, dilated RV and reflux of contract into the IVC and hepatic vein consistent with (R) heart failure, chronic bronchitis, and emphysema. US  DVT (B)LE negative for DVT. Labs notable Negative COVID, flu, RSV,for BNP 284.2, BUN 24, troponin 41--> 38 with no report of chest pain.  Lactic acid 0.9.  Blood cultures drawn and in process.  He was given Lasix , DuoNebs, and  Solu-Medrol .    Patient was treated in the hospital, condition has improved. See A/P belwo for problem based course. Currently just pending for placement.  CXR for TB r/o abn. CT chest with concern for pneumonia. 01/10 pt reported no increased SOB or coughing but more fatigued for few days. Started augmentin  + azithro, advised increased IS use and increased ambulation to encourage ventilation. CT images reviewed, PNA areas of concern lower posterior lungs with questionable persistent atelectasis / hypoventilation that may increase risk for infection. PPD negative. On 1/12, pt reported feeling bad with more SOB, fatigue, and jitteriness when xanax  wearing off. Switched augmentin  to IV rocephin , give 1L fluids, duonebs scheduled, steroids, held anoro. Lung exam no major concerns. 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 Clinically improved. Antibiotic course finished. Medically ready pending placement. 1/15.  TOC hopeful we can arrange placement for the patient soon. 1/17.  TOC will have to find another place for him.   Assessment and Plan:  Acute on chronic respiratory failure with hypoxia and hypercapnia (HCC) Multiple attempts were made to wean the patient from oxygen during the hospital course.  Pulse ox drops with ambulation on room air (down to 84% on 1/13).  Patient unable to tolerate BiPAP.  Currently on 2 L.  Will need placement.   Multifocal pneumonia Completed antibiotics 1/16.   Depression Seen by psychiatry this admission who recommended resumption of home meds which were titrated by hospitalist service as below:  Increased Seroquel  to 50 mg at night on 12/18/2024.  On Xanax  and Lexapro .  Added Wellbutrin  on 12/22.  Patient asked to come off the Wellbutrin  on 1/5 secondary to feeling nauseous. Wellbutrin  discontinued.   COPD with acute exacerbation (HCC) Treated during the  hospital course   Acute on chronic diastolic CHF (congestive heart failure) (HCC) EF 55 to 60%.  Off Lasix  at this time and stable   Nausea Resolved.  Patient believes this was secondary to Wellbutrin  which was discontinued.   Tobacco use disorder On NicoDerm patch   Headache Resolved.  Tylenol  as needed.  IV magnesium  given on 11/5.   Increased anion gap metabolic acidosis Resolved   Hypernatremia Resolved   Pulmonary nodules Will need to follow-up as outpatient.  Pulmonary referral placed.   Thrombocytosis Continue to watch intermittently   Protein-calorie malnutrition, severe Continue supplements  DVT prophylaxis: rivaroxaban  (XARELTO ) tablet 10 mg Start: 12/22/24 1700 rivaroxaban  (XARELTO ) tablet 10 mg  Xarelto    Code Status: Full Code  Disposition Plan: TBD Reason for continuing need for hospitalization: medically ready, homeless with new oxygen requirement, TOC team following   Objective: Vitals:   01/11/25 2106 01/12/25 0418 01/12/25 0500 01/12/25 0816  BP: 117/82 107/82  111/78  Pulse: 79 83  95  Resp: 16 12  19   Temp: 98.8 F (37.1 C) 98.8 F (37.1 C)  98.9 F (37.2 C)  TempSrc:  Oral  Oral  SpO2: 93% 92%  93%  Weight:   66.5 kg   Height:        Intake/Output Summary (Last 24 hours) at 01/12/2025 1024 Last data filed at 01/12/2025 1006 Gross per 24 hour  Intake 120 ml  Output --  Net 120 ml   Filed Weights   01/09/25 0311 01/10/25 0500 01/12/25 0500  Weight: 66.8 kg 66.8 kg 66.5 kg    Examination:  Physical Exam Vitals and nursing note reviewed.  Constitutional:      Appearance: He is ill-appearing (chronically).     Comments: frail  HENT:     Head: Normocephalic and atraumatic.  Cardiovascular:     Rate and Rhythm: Normal rate and regular rhythm.     Pulses: Normal pulses.     Heart sounds: Normal heart sounds.  Pulmonary:     Effort: Pulmonary effort is normal.     Breath sounds: Normal breath sounds.  Abdominal:     General:  Bowel sounds are normal.     Palpations: Abdomen is soft.     Data Reviewed: I have personally reviewed following labs and imaging studies  CBC: Recent Labs  Lab 01/06/25 0629  WBC 5.7  HGB 12.9*  HCT 40.1  MCV 91.3  PLT 278   Basic Metabolic Panel: Recent Labs  Lab 01/06/25 0629  NA 138  K 4.4  CL 102  CO2 29  GLUCOSE 101*  BUN 18  CREATININE 0.73  CALCIUM 8.7*   GFR: Estimated Creatinine Clearance: 85.3 mL/min (by C-G formula based on SCr of 0.73 mg/dL). Liver Function Tests: No results for input(s): AST, ALT, ALKPHOS, BILITOT, PROT, ALBUMIN in the last 168 hours. No results for input(s): LIPASE, AMYLASE in the last 168 hours. No results for input(s): AMMONIA in the last 168 hours. Coagulation Profile: No results for input(s): INR, PROTIME in the last 168 hours. Cardiac Enzymes: No results for input(s): CKTOTAL, CKMB, CKMBINDEX, TROPONINI in the last 168 hours. ProBNP, BNP (last 5 results) Recent Labs    10/20/24 1103 10/29/24 0608  PROBNP  --  <  50.0  BNP 284.2*  --    HbA1C: No results for input(s): HGBA1C in the last 72 hours. CBG: No results for input(s): GLUCAP in the last 168 hours. Lipid Profile: No results for input(s): CHOL, HDL, LDLCALC, TRIG, CHOLHDL, LDLDIRECT in the last 72 hours. Thyroid Function Tests: No results for input(s): TSH, T4TOTAL, FREET4, T3FREE, THYROIDAB in the last 72 hours. Anemia Panel: No results for input(s): VITAMINB12, FOLATE, FERRITIN, TIBC, IRON, RETICCTPCT in the last 72 hours. Sepsis Labs: No results for input(s): PROCALCITON, LATICACIDVEN in the last 168 hours.  No results found for this or any previous visit (from the past 240 hours).   Radiology Studies: No results found.  Scheduled Meds:  escitalopram   20 mg Oral Daily   guaiFENesin   600 mg Oral BID   multivitamin with minerals  1 tablet Oral Daily   nicotine   14 mg Transdermal Daily    pantoprazole   40 mg Oral Daily   QUEtiapine   50 mg Oral QHS   rivaroxaban   10 mg Oral Q supper   Continuous Infusions:   LOS: 84 days   Norval Bar, MD  [facility]  01/12/2025, 10:24 AM   "

## 2025-01-12 NOTE — Plan of Care (Signed)

## 2025-01-13 DIAGNOSIS — J9622 Acute and chronic respiratory failure with hypercapnia: Secondary | ICD-10-CM | POA: Diagnosis not present

## 2025-01-13 DIAGNOSIS — J9621 Acute and chronic respiratory failure with hypoxia: Secondary | ICD-10-CM | POA: Diagnosis not present

## 2025-01-13 LAB — BASIC METABOLIC PANEL WITH GFR
Anion gap: 7 (ref 5–15)
BUN: 11 mg/dL (ref 6–20)
CO2: 32 mmol/L (ref 22–32)
Calcium: 8.6 mg/dL — ABNORMAL LOW (ref 8.9–10.3)
Chloride: 100 mmol/L (ref 98–111)
Creatinine, Ser: 0.68 mg/dL (ref 0.61–1.24)
GFR, Estimated: >60 mL/min
Glucose, Bld: 102 mg/dL — ABNORMAL HIGH (ref 70–99)
Potassium: 4.2 mmol/L (ref 3.5–5.1)
Sodium: 139 mmol/L (ref 135–145)

## 2025-01-13 LAB — CBC
HCT: 39.3 % (ref 39.0–52.0)
Hemoglobin: 12.4 g/dL — ABNORMAL LOW (ref 13.0–17.0)
MCH: 29.2 pg (ref 26.0–34.0)
MCHC: 31.6 g/dL (ref 30.0–36.0)
MCV: 92.5 fL (ref 80.0–100.0)
Platelets: 213 10*3/uL (ref 150–400)
RBC: 4.25 MIL/uL (ref 4.22–5.81)
RDW: 13 % (ref 11.5–15.5)
WBC: 5.8 10*3/uL (ref 4.0–10.5)
nRBC: 0 % (ref 0.0–0.2)

## 2025-01-13 NOTE — Plan of Care (Signed)
" °  Problem: Education: Goal: Knowledge of General Education information will improve Description: Including pain rating scale, medication(s)/side effects and non-pharmacologic comfort measures 01/13/2025 1922 by Epifanio Rung D, LPN Outcome: Progressing 01/13/2025 1922 by Epifanio Rung BIRCH, LPN Outcome: Progressing   Problem: Health Behavior/Discharge Planning: Goal: Ability to manage health-related needs will improve 01/13/2025 1922 by Epifanio Rung BIRCH, LPN Outcome: Progressing 01/13/2025 1922 by Epifanio Rung BIRCH, LPN Outcome: Progressing   "

## 2025-01-13 NOTE — Progress Notes (Signed)
 " Progress Note   Patient: Corey House. FMW:[id number] DOB: [birth year] DOA: 10/20/2024     85 DOS: the patient was seen and examined on 01/13/2025   Brief hospital course:  From HPI 58 y.o. year old male with past medical history of  tobacco use disorder reporting a 41 year pack per year history, smoking ~1 pack per day since he was 59 years old. He denies any additional medical history though he has not seen a medical provider in ~20 years. He presents to [facility] ED with shortness of breath that began ~ 9 months ago and worsened ~3 months ago. He reports today his dyspnea is not significantly different than it was 3 months ago. He endorses a chronic cough that has been present since childhood. Her ports the cough is currently, minimally productive with white phlegm. Additionally, he endorses bilateral lower extremity swelling that began in the last 2 days, per his report (R) > (L). On exam the swelling is equal bilaterally. He endorses fatigue and dyspnea that is worse with position changes such as bending over and when standing from lying. He denies chest pain, palpitations, or fever.   ED Course: On arrival to [facility] ED patient was noted to be afebrile temp 36.8C, BP 142/111, HR 88, RR 22, SPO2 100% on 15L non-rebreather. Patient now on 2L via nasal cannula with SPO2 96%. CXR obtained and shows non specific findings of increased interstitial markings. CTA PE obtained and shows no PE, dilated RV and reflux of contract into the IVC and hepatic vein consistent with (R) heart failure, chronic bronchitis, and emphysema. US  DVT (B)LE negative for DVT. Labs notable Negative COVID, flu, RSV,for BNP 284.2, BUN 24, troponin 41--> 38 with no report of chest pain.  Lactic acid 0.9.  Blood cultures drawn and in process.  He was given Lasix , DuoNebs, and Solu-Medrol .     Patient was treated in the hospital, condition has improved. See A/P belwo for problem based course. Currently just  pending for placement.   CXR for TB r/o abn. CT chest with concern for pneumonia. 01/10 pt reported no increased SOB or coughing but more fatigued for few days. Started augmentin  + azithro, advised increased IS use and increased ambulation to encourage ventilation. CT images reviewed, PNA areas of concern lower posterior lungs with questionable persistent atelectasis / hypoventilation that may increase risk for infection. PPD negative. On 1/12, pt reported feeling bad with more SOB, fatigue, and jitteriness when xanax  wearing off. Switched augmentin  to IV rocephin , give 1L fluids, duonebs scheduled, steroids, held anoro. Lung exam no major concerns. 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 Clinically improved. Antibiotic course finished. Medically ready pending placement. 1/15.  TOC hopeful we can arrange placement for the patient soon. 1/17.  TOC will have to find another place for him.   Assessment and Plan:   Acute on chronic respiratory failure with hypoxia and hypercapnia (HCC) Multiple attempts were made to wean the patient from oxygen during the hospital course.  Pulse ox drops with ambulation on room air (down to 84% on 1/13).   Currently on 2 L of oxygen  Multifocal pneumonia Completed antibiotics 1/16.   Depression Seen by psychiatry this admission who recommended resumption of home meds which were titrated by hospitalist service as below: Increased Seroquel  to 50 mg at night on 12/18/2024.  On Xanax  and Lexapro .  Added Wellbutrin  on 12/22.  Patient asked  to come off the Wellbutrin  on 1/5 secondary to feeling nauseous. Wellbutrin  discontinued.   COPD with acute exacerbation (HCC) Treated during the hospital course   Acute on chronic diastolic CHF (congestive heart failure) (HCC) EF 55 to 60%.  Off Lasix  at this time and stable   Nausea Resolved.  Patient believes this was  secondary to Wellbutrin  which was discontinued.   Tobacco use disorder On NicoDerm patch   Headache Resolved.  Tylenol  as needed.  IV magnesium  given on 11/5.   Increased anion gap metabolic acidosis Resolved   Hypernatremia Resolved   Pulmonary nodules Will need to follow-up as outpatient.  Pulmonary referral placed.   Thrombocytosis Continue to watch intermittently   Protein-calorie malnutrition, severe Continue supplements   DVT prophylaxis: rivaroxaban  (XARELTO ) tablet 10 mg Start: 12/22/24 1700 rivaroxaban  (XARELTO ) tablet 10 mg  Xarelto    Code Status: Full Code   Disposition Plan: TBD Reason for continuing need for hospitalization: medically ready, homeless with new oxygen requirement, TOC team following     Examination:   Physical Exam Vitals and nursing note reviewed.  Constitutional:      Appearance: He is ill-appearing (chronically).     Comments: frail  HENT:     Head: Normocephalic and atraumatic.  Cardiovascular:     Rate and Rhythm: Normal rate and regular rhythm.     Pulses: Normal pulses.     Heart sounds: Normal heart sounds.  Pulmonary:     Effort: Pulmonary effort is normal.     Breath sounds: Normal breath sounds.  Abdominal:     General: Bowel sounds are normal.     Palpations: Abdomen is soft.       Data Reviewed:      Latest Ref Rng & Units 01/13/2025    8:01 AM 01/06/2025    6:29 AM 12/28/2024   12:39 PM  CBC  WBC 4.0 - 10.5 K/uL 5.8  5.7  9.0   Hemoglobin 13.0 - 17.0 g/dL 87.5  87.0  85.8   Hematocrit 39.0 - 52.0 % 39.3  40.1  42.4   Platelets 150 - 400 K/uL 213  278  440     Vitals:   01/13/25 0414 01/13/25 0919 01/13/25 0920 01/13/25 1524  BP: 113/79 118/81 118/81 104/83  Pulse: 95 96 95 88  Resp: 18 18 18 18   Temp: 97.8 F (36.6 C) 98.9 F (37.2 C) 98.9 F (37.2 C) 98.4 F (36.9 C)  TempSrc: Oral Oral Oral Oral  SpO2: 92% 91% 93% 90%  Weight: 67 kg     Height:        Author: Drue ONEIDA Potter, MD 01/13/2025 4:17  PM  For on call review [email or web address]  "

## 2025-01-13 NOTE — Plan of Care (Signed)

## 2025-01-14 DIAGNOSIS — J9621 Acute and chronic respiratory failure with hypoxia: Secondary | ICD-10-CM | POA: Diagnosis not present

## 2025-01-14 DIAGNOSIS — J9622 Acute and chronic respiratory failure with hypercapnia: Secondary | ICD-10-CM | POA: Diagnosis not present

## 2025-01-14 LAB — BASIC METABOLIC PANEL WITH GFR
Anion gap: 8 (ref 5–15)
BUN: 11 mg/dL (ref 6–20)
CO2: 31 mmol/L (ref 22–32)
Calcium: 8.7 mg/dL — ABNORMAL LOW (ref 8.9–10.3)
Chloride: 101 mmol/L (ref 98–111)
Creatinine, Ser: 0.65 mg/dL (ref 0.61–1.24)
GFR, Estimated: >60 mL/min
Glucose, Bld: 103 mg/dL — ABNORMAL HIGH (ref 70–99)
Potassium: 4.2 mmol/L (ref 3.5–5.1)
Sodium: 140 mmol/L (ref 135–145)

## 2025-01-14 LAB — CBC WITH DIFFERENTIAL/PLATELET
Abs Immature Granulocytes: 0.07 10*3/uL (ref 0.00–0.07)
Basophils Absolute: 0 10*3/uL (ref 0.0–0.1)
Basophils Relative: 1 %
Eosinophils Absolute: 0.2 10*3/uL (ref 0.0–0.5)
Eosinophils Relative: 5 %
HCT: 38.8 % — ABNORMAL LOW (ref 39.0–52.0)
Hemoglobin: 12.3 g/dL — ABNORMAL LOW (ref 13.0–17.0)
Immature Granulocytes: 1 %
Lymphocytes Relative: 23 %
Lymphs Abs: 1.1 10*3/uL (ref 0.7–4.0)
MCH: 29.1 pg (ref 26.0–34.0)
MCHC: 31.7 g/dL (ref 30.0–36.0)
MCV: 91.9 fL (ref 80.0–100.0)
Monocytes Absolute: 0.6 10*3/uL (ref 0.1–1.0)
Monocytes Relative: 13 %
Neutro Abs: 2.9 10*3/uL (ref 1.7–7.7)
Neutrophils Relative %: 57 %
Platelets: 239 10*3/uL (ref 150–400)
RBC: 4.22 MIL/uL (ref 4.22–5.81)
RDW: 13 % (ref 11.5–15.5)
WBC: 4.9 10*3/uL (ref 4.0–10.5)
nRBC: 0 % (ref 0.0–0.2)

## 2025-01-14 NOTE — Progress Notes (Signed)
 " Progress Note   Patient: Corey House. FMW:[id number] DOB: Sep 05, 1966 DOA: 10/20/2024     86 DOS: the patient was seen and examined on 01/14/2025    Brief hospital course:   From HPI 59 y.o. year old male with past medical history of  tobacco use disorder reporting a 41 year pack per year history, smoking ~1 pack per day since he was 59 years old. He denies any additional medical history though he has not seen a medical provider in ~20 years. He presents to [facility] ED with shortness of breath that began ~ 9 months ago and worsened ~3 months ago. He reports today his dyspnea is not significantly different than it was 3 months ago. He endorses a chronic cough that has been present since childhood. Her ports the cough is currently, minimally productive with white phlegm. Additionally, he endorses bilateral lower extremity swelling that began in the last 2 days, per his report (R) > (L). On exam the swelling is equal bilaterally. He endorses fatigue and dyspnea that is worse with position changes such as bending over and when standing from lying. He denies chest pain, palpitations, or fever.   ED Course: On arrival to [facility] ED patient was noted to be afebrile temp 36.8C, BP 142/111, HR 88, RR 22, SPO2 100% on 15L non-rebreather. Patient now on 2L via nasal cannula with SPO2 96%. CXR obtained and shows non specific findings of increased interstitial markings. CTA PE obtained and shows no PE, dilated RV and reflux of contract into the IVC and hepatic vein consistent with (R) heart failure, chronic bronchitis, and emphysema. US  DVT (B)LE negative for DVT. Labs notable Negative COVID, flu, RSV,for BNP 284.2, BUN 24, troponin 41--> 38 with no report of chest pain.  Lactic acid 0.9.  Blood cultures drawn and in process.  He was given Lasix , DuoNebs, and Solu-Medrol .     Patient was treated in the hospital, condition has improved. See A/P belwo for problem based course. Currently just  pending for placement.   CXR for TB r/o abn. CT chest with concern for pneumonia. 01/10 pt reported no increased SOB or coughing but more fatigued for few days. Started augmentin  + azithro, advised increased IS use and increased ambulation to encourage ventilation. CT images reviewed, PNA areas of concern lower posterior lungs with questionable persistent atelectasis / hypoventilation that may increase risk for infection. PPD negative. On 1/12, pt reported feeling bad with more SOB, fatigue, and jitteriness when xanax  wearing off. Switched augmentin  to IV rocephin , give 1L fluids, duonebs scheduled, steroids, held anoro. Lung exam no major concerns. 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 Clinically improved. Antibiotic course finished. Medically ready pending placement. 1/15.  TOC hopeful we can arrange placement for the patient soon. 1/17.  TOC will have to find another place for him.     Assessment and Plan:   Acute on chronic respiratory failure with hypoxia and hypercapnia (HCC) Multiple attempts were made to wean the patient from oxygen during the hospital course.  Pulse ox drops with ambulation on room air (down to 84% on 1/13).   Currently on 2 L of oxygen TOC working on placement   Multifocal pneumonia Completed antibiotics 1/16.   Depression Seen by psychiatry this admission who recommended resumption of home meds which were titrated by hospitalist service as below: Increased Seroquel  to 50 mg at night on 12/18/2024.  On Xanax  and  Lexapro .  Added Wellbutrin  on 12/22.  Patient asked to come off the Wellbutrin  on 1/5 secondary to feeling nauseous. Wellbutrin  discontinued.   COPD with acute exacerbation (HCC) Treated during the hospital course   Acute on chronic diastolic CHF (congestive heart failure) (HCC) EF 55 to 60%.  Off Lasix  at this time and stable   Nausea Resolved.   Patient believes this was secondary to Wellbutrin  which was discontinued.   Tobacco use disorder On NicoDerm patch   Headache Resolved.  Tylenol  as needed.  IV magnesium  given on 11/5.   Increased anion gap metabolic acidosis Resolved   Hypernatremia Resolved   Pulmonary nodules Will need to follow-up as outpatient.  Pulmonary referral placed.   Thrombocytosis Continue to watch intermittently   Protein-calorie malnutrition, severe Continue supplements   DVT prophylaxis: rivaroxaban  (XARELTO ) tablet 10 mg Start: 12/22/24 1700 rivaroxaban  (XARELTO ) tablet 10 mg  Xarelto    Code Status: Full Code   Disposition Plan: TBD Reason for continuing need for hospitalization: medically ready, homeless with new oxygen requirement, TOC team following      Examination:   Physical Exam Vitals and nursing note reviewed.  Constitutional:      Appearance: He is ill-appearing (chronically).     Comments: frail  HENT:     Head: Normocephalic and atraumatic.  Cardiovascular:     Rate and Rhythm: Normal rate and regular rhythm.     Pulses: Normal pulses.     Heart sounds: Normal heart sounds.  Pulmonary:     Effort: Pulmonary effort is normal.     Breath sounds: Normal breath sounds.  Abdominal:     General: Bowel sounds are normal.     Palpations: Abdomen is soft.     Subjective: Seen and examined at bedside this morning Continues to require intranasal oxygen TOC working on placement Denies any acute overnight complaint  Data Reviewed:    Latest Ref Rng & Units 01/14/2025    7:36 AM 01/13/2025    8:01 AM 01/06/2025    6:29 AM  CBC  WBC 4.0 - 10.5 K/uL 4.9  5.8  5.7   Hemoglobin 13.0 - 17.0 g/dL 87.6  87.5  87.0   Hematocrit 39.0 - 52.0 % 38.8  39.3  40.1   Platelets 150 - 400 K/uL 239  213  278        Latest Ref Rng & Units 01/14/2025    7:36 AM 01/13/2025    8:01 AM 01/06/2025    6:29 AM  BMP  Glucose 70 - 99 mg/dL 896  897  898   BUN 6 - 20 mg/dL 11  11  18     Creatinine 0.61 - 1.24 mg/dL 9.34  9.31  9.26   Sodium 135 - 145 mmol/L 140  139  138   Potassium 3.5 - 5.1 mmol/L 4.2  4.2  4.4   Chloride 98 - 111 mmol/L 101  100  102   CO2 22 - 32 mmol/L 31  32  29   Calcium 8.9 - 10.3 mg/dL 8.7  8.6  8.7     Vitals:   01/14/25 0410 01/14/25 0442 01/14/25 0857 01/14/25 1550  BP:  102/73 115/76 105/77  Pulse:  73 80 84  Resp:  18 16 16   Temp:  98.3 F (36.8 C) 98.6 F (37 C) 98.9 F (37.2 C)  TempSrc:  Oral Oral Oral  SpO2:  91% 91% 94%  Weight: 66.3 kg     Height:  Author: Drue ONEIDA Potter, MD 01/14/2025 5:07 PM  For on call review [email or web address].  "

## 2025-01-15 DIAGNOSIS — J9622 Acute and chronic respiratory failure with hypercapnia: Secondary | ICD-10-CM | POA: Diagnosis not present

## 2025-01-15 DIAGNOSIS — J9621 Acute and chronic respiratory failure with hypoxia: Secondary | ICD-10-CM | POA: Diagnosis not present

## 2025-01-15 NOTE — Plan of Care (Signed)
" °  Problem: Education: Goal: Knowledge of General Education information will improve Description: Including pain rating scale, medication(s)/side effects and non-pharmacologic comfort measures Outcome: Progressing   Problem: Health Behavior/Discharge Planning: Goal: Ability to manage health-related needs will improve Outcome: Progressing   Problem: Clinical Measurements: Goal: Ability to maintain clinical measurements within normal limits will improve Outcome: Progressing Goal: Will remain free from infection Outcome: Progressing Goal: Diagnostic test results will improve Outcome: Progressing Goal: Respiratory complications will improve Outcome: Progressing Goal: Cardiovascular complication will be avoided Outcome: Progressing   Problem: Activity: Goal: Risk for activity intolerance will decrease Outcome: Progressing   Problem: Nutrition: Goal: Adequate nutrition will be maintained Outcome: Progressing   Problem: Coping: Goal: Level of anxiety will decrease Outcome: Progressing   Problem: Elimination: Goal: Will not experience complications related to bowel motility Outcome: Progressing Goal: Will not experience complications related to urinary retention Outcome: Progressing   Problem: Pain Managment: Goal: General experience of comfort will improve and/or be controlled Outcome: Progressing   Problem: Safety: Goal: Ability to remain free from injury will improve Outcome: Progressing   Problem: Skin Integrity: Goal: Risk for impaired skin integrity will decrease Outcome: Progressing   Problem: Activity: Goal: Ability to tolerate increased activity will improve Outcome: Progressing Goal: Will verbalize the importance of balancing activity with adequate rest periods Outcome: Progressing   Problem: Respiratory: Goal: Ability to maintain a clear airway will improve Outcome: Progressing Goal: Levels of oxygenation will improve Outcome: Progressing Goal: Ability  to maintain adequate ventilation will improve Outcome: Progressing   Problem: Activity: Goal: Ability to tolerate increased activity will improve Outcome: Progressing   Problem: Clinical Measurements: Goal: Ability to maintain a body temperature in the normal range will improve Outcome: Progressing   Problem: Respiratory: Goal: Ability to maintain adequate ventilation will improve Outcome: Progressing Goal: Ability to maintain a clear airway will improve Outcome: Progressing   "

## 2025-01-15 NOTE — Progress Notes (Signed)
 " Progress Note   Patient: Corey House. FMW:[id number] DOB: 03-21-1966 DOA: 10/20/2024     87 DOS: the patient was seen and examined on 01/15/2025   Brief hospital course:   From HPI 59 y.o. year old male with past medical history of  tobacco use disorder reporting a 41 year pack per year history, smoking ~1 pack per day since he was 59 years old. He denies any additional medical history though he has not seen a medical provider in ~20 years. He presents to [facility] ED with shortness of breath that began ~ 9 months ago and worsened ~3 months ago. He reports today his dyspnea is not significantly different than it was 3 months ago. He endorses a chronic cough that has been present since childhood. Her ports the cough is currently, minimally productive with white phlegm. Additionally, he endorses bilateral lower extremity swelling that began in the last 2 days, per his report (R) > (L). On exam the swelling is equal bilaterally. He endorses fatigue and dyspnea that is worse with position changes such as bending over and when standing from lying. He denies chest pain, palpitations, or fever.   ED Course: On arrival to [facility] ED patient was noted to be afebrile temp 36.8C, BP 142/111, HR 88, RR 22, SPO2 100% on 15L non-rebreather. Patient now on 2L via nasal cannula with SPO2 96%. CXR obtained and shows non specific findings of increased interstitial markings. CTA PE obtained and shows no PE, dilated RV and reflux of contract into the IVC and hepatic vein consistent with (R) heart failure, chronic bronchitis, and emphysema. US  DVT (B)LE negative for DVT. Labs notable Negative COVID, flu, RSV,for BNP 284.2, BUN 24, troponin 41--> 38 with no report of chest pain.  Lactic acid 0.9.  Blood cultures drawn and in process.  He was given Lasix , DuoNebs, and Solu-Medrol .     Patient was treated in the hospital, condition has improved. See A/P belwo for problem based course. Currently just  pending for placement.   CXR for TB r/o abn. CT chest with concern for pneumonia. 01/10 pt reported no increased SOB or coughing but more fatigued for few days. Started augmentin  + azithro, advised increased IS use and increased ambulation to encourage ventilation. CT images reviewed, PNA areas of concern lower posterior lungs with questionable persistent atelectasis / hypoventilation that may increase risk for infection. PPD negative. On 1/12, pt reported feeling bad with more SOB, fatigue, and jitteriness when xanax  wearing off. Switched augmentin  to IV rocephin , give 1L fluids, duonebs scheduled, steroids, held anoro. Lung exam no major concerns. 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 Clinically improved. Antibiotic course finished. Medically ready pending placement. 1/15.  TOC hopeful we can arrange placement for the patient soon. 1/17.  TOC will have to find another place for him.     Assessment and Plan:   Acute on chronic respiratory failure with hypoxia and hypercapnia (HCC) Multiple attempts were made to wean the patient from oxygen during the hospital course.  Pulse ox drops with ambulation on room air (down to 84% on 1/13).   Currently on 2 L of oxygen TOC working on placement   Multifocal pneumonia Completed antibiotics 1/16.   Depression Seen by psychiatry this admission who recommended resumption of home meds which were titrated by hospitalist service as below: Increased Seroquel  to 50 mg at night on 12/18/2024.  On Xanax  and Lexapro .  Added Wellbutrin  on 12/22.  Patient asked to come off the Wellbutrin  on 1/5 secondary to feeling nauseous. Wellbutrin  discontinued.   COPD with acute exacerbation (HCC) Treated during the hospital course   Acute on chronic diastolic CHF (congestive heart failure) (HCC) EF 55 to 60%.  Off Lasix  at this time and stable   Nausea Resolved.   Patient believes this was secondary to Wellbutrin  which was discontinued.   Tobacco use disorder On NicoDerm patch   Headache Resolved.  Tylenol  as needed.  IV magnesium  given on 11/5.   Increased anion gap metabolic acidosis Resolved   Hypernatremia Resolved   Pulmonary nodules Will need to follow-up as outpatient.  Pulmonary referral placed.   Thrombocytosis Continue to watch intermittently   Protein-calorie malnutrition, severe Continue supplements   DVT prophylaxis: rivaroxaban  (XARELTO ) tablet 10 mg Start: 12/22/24 1700 rivaroxaban  (XARELTO ) tablet 10 mg  Xarelto    Code Status: Full Code   Disposition Plan: TBD Reason for continuing need for hospitalization: medically ready, homeless with new oxygen requirement, TOC team following      Examination:   Physical Exam Vitals and nursing note reviewed.  Constitutional:      Appearance: He is ill-appearing (chronically).     Comments: frail  HENT:     Head: Normocephalic and atraumatic.  Cardiovascular:     Rate and Rhythm: Normal rate and regular rhythm.     Pulses: Normal pulses.     Heart sounds: Normal heart sounds.  Pulmonary:     Effort: Pulmonary effort is normal.     Breath sounds: Normal breath sounds.  Abdominal:     General: Bowel sounds are normal.     Palpations: Abdomen is soft.     Subjective: No acute overnight event Still requiring 2 L of intranasal oxygen   Data Reviewed:  Vitals:   01/14/25 1550 01/14/25 1951 01/15/25 0524 01/15/25 1147  BP: 105/77 112/79 104/76 102/73  Pulse: 84 80 67 87  Resp: 16 18 18 16   Temp: 98.9 F (37.2 C) 98.3 F (36.8 C) (!) 97.4 F (36.3 C) 98.2 F (36.8 C)  TempSrc: Oral     SpO2: 94% 91% 93% 93%  Weight:   68.3 kg   Height:          Latest Ref Rng & Units 01/14/2025    7:36 AM 01/13/2025    8:01 AM 01/06/2025    6:29 AM  CBC  WBC 4.0 - 10.5 K/uL 4.9  5.8  5.7   Hemoglobin 13.0 - 17.0 g/dL 87.6  87.5  87.0   Hematocrit 39.0 - 52.0 % 38.8   39.3  40.1   Platelets 150 - 400 K/uL 239  213  278        Latest Ref Rng & Units 01/14/2025    7:36 AM 01/13/2025    8:01 AM 01/06/2025    6:29 AM  BMP  Glucose 70 - 99 mg/dL 896  897  898   BUN 6 - 20 mg/dL 11  11  18    Creatinine 0.61 - 1.24 mg/dL 9.34  9.31  9.26   Sodium 135 - 145 mmol/L 140  139  138   Potassium 3.5 - 5.1 mmol/L 4.2  4.2  4.4   Chloride 98 - 111 mmol/L 101  100  102   CO2 22 - 32 mmol/L 31  32  29   Calcium 8.9 - 10.3 mg/dL 8.7  8.6  8.7      Author: Drue ONEIDA Potter, MD  01/15/2025 6:10 PM  For on call review [email or web address]  "

## 2025-01-16 DIAGNOSIS — J9621 Acute and chronic respiratory failure with hypoxia: Secondary | ICD-10-CM | POA: Diagnosis not present

## 2025-01-16 DIAGNOSIS — J9622 Acute and chronic respiratory failure with hypercapnia: Secondary | ICD-10-CM | POA: Diagnosis not present

## 2025-01-16 NOTE — Progress Notes (Signed)
 " Progress Note   Patient: Corey House. FMW:[id number] DOB: 03-25-1966 DOA: 10/20/2024     88 DOS: the patient was seen and examined on 01/16/2025    Brief hospital course:   From HPI 59 y.o. year old male with past medical history of  tobacco use disorder reporting a 41 year pack per year history, smoking ~1 pack per day since he was 59 years old. He denies any additional medical history though he has not seen a medical provider in ~20 years. He presents to [facility] ED with shortness of breath that began ~ 9 months ago and worsened ~3 months ago. He reports today his dyspnea is not significantly different than it was 3 months ago. He endorses a chronic cough that has been present since childhood. Her ports the cough is currently, minimally productive with white phlegm. Additionally, he endorses bilateral lower extremity swelling that began in the last 2 days, per his report (R) > (L). On exam the swelling is equal bilaterally. He endorses fatigue and dyspnea that is worse with position changes such as bending over and when standing from lying. He denies chest pain, palpitations, or fever.   ED Course: On arrival to [facility] ED patient was noted to be afebrile temp 36.8C, BP 142/111, HR 88, RR 22, SPO2 100% on 15L non-rebreather. Patient now on 2L via nasal cannula with SPO2 96%. CXR obtained and shows non specific findings of increased interstitial markings. CTA PE obtained and shows no PE, dilated RV and reflux of contract into the IVC and hepatic vein consistent with (R) heart failure, chronic bronchitis, and emphysema. US  DVT (B)LE negative for DVT. Labs notable Negative COVID, flu, RSV,for BNP 284.2, BUN 24, troponin 41--> 38 with no report of chest pain.  Lactic acid 0.9.  Blood cultures drawn and in process.  He was given Lasix , DuoNebs, and Solu-Medrol .     Patient was treated in the hospital, condition has improved. See A/P belwo for problem based course. Currently just  pending for placement.   CXR for TB r/o abn. CT chest with concern for pneumonia. 01/10 pt reported no increased SOB or coughing but more fatigued for few days. Started augmentin  + azithro, advised increased IS use and increased ambulation to encourage ventilation. CT images reviewed, PNA areas of concern lower posterior lungs with questionable persistent atelectasis / hypoventilation that may increase risk for infection. PPD negative. On 1/12, pt reported feeling bad with more SOB, fatigue, and jitteriness when xanax  wearing off. Switched augmentin  to IV rocephin , give 1L fluids, duonebs scheduled, steroids, held anoro. Lung exam no major concerns. 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 Clinically improved. Antibiotic course finished. Medically ready pending placement. 1/15.  TOC hopeful we can arrange placement for the patient soon. 1/17.  TOC will have to find another place for him.     Assessment and Plan:   Acute on chronic respiratory failure with hypoxia and hypercapnia (HCC) Multiple attempts were made to wean the patient from oxygen during the hospital course.  Pulse ox drops with ambulation on room air (down to 84% on 1/13).   Currently on 2 L of oxygen TOC working on placement   Multifocal pneumonia Completed antibiotics 1/16.   Depression Seen by psychiatry this admission who recommended resumption of home meds which were titrated by hospitalist service as below: Increased Seroquel  to 50 mg at night on 12/18/2024.  On Xanax  and  Lexapro .  Added Wellbutrin  on 12/22.  Patient asked to come off the Wellbutrin  on 1/5 secondary to feeling nauseous. Wellbutrin  discontinued.   COPD with acute exacerbation (HCC) Treated during the hospital course   Acute on chronic diastolic CHF (congestive heart failure) (HCC) EF 55 to 60%.  Off Lasix  at this time and stable   Nausea Resolved.   Patient believes this was secondary to Wellbutrin  which was discontinued.   Tobacco use disorder On NicoDerm patch   Headache Resolved.  Tylenol  as needed.  IV magnesium  given on 11/5.   Increased anion gap metabolic acidosis Resolved   Hypernatremia Resolved   Pulmonary nodules Will need to follow-up as outpatient.  Pulmonary referral placed.   Thrombocytosis Continue to watch intermittently   Protein-calorie malnutrition, severe Continue supplements   DVT prophylaxis: rivaroxaban  (XARELTO ) tablet 10 mg Start: 12/22/24 1700 rivaroxaban  (XARELTO ) tablet 10 mg  Xarelto    Code Status: Full Code   Disposition Plan: TBD Reason for continuing need for hospitalization: medically ready, homeless with new oxygen requirement, TOC team following      Examination:   Physical Exam Vitals and nursing note reviewed.  Constitutional:      Appearance: He is ill-appearing (chronically).     Comments: frail  HENT:     Head: Normocephalic and atraumatic.  Cardiovascular:     Rate and Rhythm: Normal rate and regular rhythm.     Pulses: Normal pulses.     Heart sounds: Normal heart sounds.  Pulmonary:     Effort: Pulmonary effort is normal.     Breath sounds: Normal breath sounds.  Abdominal:     General: Bowel sounds are normal.     Palpations: Abdomen is soft.     Subjective: No acute overnight event Still requiring 2 L of intranasal oxygen   Data Reviewed:      Latest Ref Rng & Units 01/14/2025    7:36 AM 01/13/2025    8:01 AM 01/06/2025    6:29 AM  CBC  WBC 4.0 - 10.5 K/uL 4.9  5.8  5.7   Hemoglobin 13.0 - 17.0 g/dL 87.6  87.5  87.0   Hematocrit 39.0 - 52.0 % 38.8  39.3  40.1   Platelets 150 - 400 K/uL 239  213  278        Latest Ref Rng & Units 01/14/2025    7:36 AM 01/13/2025    8:01 AM 01/06/2025    6:29 AM  BMP  Glucose 70 - 99 mg/dL 896  897  898   BUN 6 - 20 mg/dL 11  11  18    Creatinine 0.61 - 1.24 mg/dL 9.34  9.31  9.26   Sodium 135 - 145 mmol/L 140   139  138   Potassium 3.5 - 5.1 mmol/L 4.2  4.2  4.4   Chloride 98 - 111 mmol/L 101  100  102   CO2 22 - 32 mmol/L 31  32  29   Calcium 8.9 - 10.3 mg/dL 8.7  8.6  8.7     Vitals:   01/15/25 2003 01/16/25 0456 01/16/25 0836 01/16/25 1604  BP: 108/79 103/76 118/81 114/75  Pulse: 84 80 83 80  Resp: 17 18 18 16   Temp: 98.2 F (36.8 C) 97.8 F (36.6 C) 98 F (36.7 C) 98.4 F (36.9 C)  TempSrc:      SpO2: 92% 93% 92% 91%  Weight:      Height:        Author: Drue ONEIDA Potter,  MD 01/16/2025 4:05 PM  For on call review [email or web address]  "

## 2025-01-16 NOTE — Plan of Care (Signed)

## 2025-01-16 NOTE — Plan of Care (Signed)

## 2025-01-17 DIAGNOSIS — J9622 Acute and chronic respiratory failure with hypercapnia: Secondary | ICD-10-CM | POA: Diagnosis not present

## 2025-01-17 DIAGNOSIS — J9621 Acute and chronic respiratory failure with hypoxia: Secondary | ICD-10-CM | POA: Diagnosis not present

## 2025-01-17 NOTE — Plan of Care (Signed)
  Problem: Pain Managment: Goal: General experience of comfort will improve and/or be controlled Outcome: Progressing   Problem: Safety: Goal: Ability to remain free from injury will improve Outcome: Progressing

## 2025-01-17 NOTE — Progress Notes (Signed)
 " Progress Note   Patient: Corey House. FMW:[id number] DOB: January 21, 1966 DOA: 10/20/2024     89 DOS: the patient was seen and examined on 01/17/2025    Brief hospital course:   From HPI 59 y.o. year old male with past medical history of  tobacco use disorder reporting a 41 year pack per year history, smoking ~1 pack per day since he was 58 years old. He denies any additional medical history though he has not seen a medical provider in ~20 years. He presents to [facility] ED with shortness of breath that began ~ 9 months ago and worsened ~3 months ago. He reports today his dyspnea is not significantly different than it was 3 months ago. He endorses a chronic cough that has been present since childhood. Her ports the cough is currently, minimally productive with white phlegm. Additionally, he endorses bilateral lower extremity swelling that began in the last 2 days, per his report (R) > (L). On exam the swelling is equal bilaterally. He endorses fatigue and dyspnea that is worse with position changes such as bending over and when standing from lying. He denies chest pain, palpitations, or fever.   ED Course: On arrival to [facility] ED patient was noted to be afebrile temp 36.8C, BP 142/111, HR 88, RR 22, SPO2 100% on 15L non-rebreather. Patient now on 2L via nasal cannula with SPO2 96%. CXR obtained and shows non specific findings of increased interstitial markings. CTA PE obtained and shows no PE, dilated RV and reflux of contract into the IVC and hepatic vein consistent with (R) heart failure, chronic bronchitis, and emphysema. US  DVT (B)LE negative for DVT. Labs notable Negative COVID, flu, RSV,for BNP 284.2, BUN 24, troponin 41--> 38 with no report of chest pain.  Lactic acid 0.9.  Blood cultures drawn and in process.  He was given Lasix , DuoNebs, and Solu-Medrol .     Patient was treated in the hospital, condition has improved. See A/P belwo for problem based course. Currently just  pending for placement.   CXR for TB r/o abn. CT chest with concern for pneumonia. 01/10 pt reported no increased SOB or coughing but more fatigued for few days. Started augmentin  + azithro, advised increased IS use and increased ambulation to encourage ventilation. CT images reviewed, PNA areas of concern lower posterior lungs with questionable persistent atelectasis / hypoventilation that may increase risk for infection. PPD negative. On 1/12, pt reported feeling bad with more SOB, fatigue, and jitteriness when xanax  wearing off. Switched augmentin  to IV rocephin , give 1L fluids, duonebs scheduled, steroids, held anoro. Lung exam no major concerns. 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 Clinically improved. Antibiotic course finished. Medically ready pending placement. 1/15.  TOC hopeful we can arrange placement for the patient soon. 1/17.  TOC will have to find another place for him.     Assessment and Plan:   Acute on chronic respiratory failure with hypoxia and hypercapnia (HCC) Multiple attempts were made to wean the patient from oxygen during the hospital course.  Pulse ox drops with ambulation on room air (down to 84% on 1/13).   Currently on 2 L of oxygen TOC working on placement   Multifocal pneumonia Completed antibiotics 1/16.   Depression Seen by psychiatry this admission who recommended resumption of home meds which were titrated by hospitalist service as below: Increased Seroquel  to 50 mg at night on 12/18/2024.  On Xanax  and  Lexapro .  Added Wellbutrin  on 12/22.  Patient asked to come off the Wellbutrin  on 1/5 secondary to feeling nauseous. Wellbutrin  discontinued.   COPD with acute exacerbation (HCC) Treated during the hospital course   Acute on chronic diastolic CHF (congestive heart failure) (HCC) EF 55 to 60%.  Off Lasix  at this time and stable   Nausea Resolved.   Patient believes this was secondary to Wellbutrin  which was discontinued.   Tobacco use disorder On NicoDerm patch   Headache Resolved.  Tylenol  as needed.  IV magnesium  given on 11/5.   Increased anion gap metabolic acidosis Resolved   Hypernatremia Resolved   Pulmonary nodules Will need to follow-up as outpatient.  Pulmonary referral placed.   Thrombocytosis Continue to watch intermittently   Protein-calorie malnutrition, severe Continue supplements   DVT prophylaxis: rivaroxaban  (XARELTO ) tablet 10 mg Start: 12/22/24 1700 rivaroxaban  (XARELTO ) tablet 10 mg  Xarelto    Code Status: Full Code   Disposition Plan: TBD Reason for continuing need for hospitalization: medically ready, homeless with new oxygen requirement, TOC team following      Examination:   Physical Exam Vitals and nursing note reviewed.  Constitutional:      Appearance: He is ill-appearing (chronically).     Comments: frail  HENT:     Head: Normocephalic and atraumatic.  Cardiovascular:     Rate and Rhythm: Normal rate and regular rhythm.     Pulses: Normal pulses.     Heart sounds: Normal heart sounds.  Pulmonary:     Effort: Pulmonary effort is normal.     Breath sounds: Normal breath sounds.  Abdominal:     General: Bowel sounds are normal.     Palpations: Abdomen is soft.     Subjective: No acute overnight event Still requiring 2 L of intranasal oxygen   Data Reviewed:      Latest Ref Rng & Units 01/14/2025    7:36 AM 01/13/2025    8:01 AM 01/06/2025    6:29 AM  CBC  WBC 4.0 - 10.5 K/uL 4.9  5.8  5.7   Hemoglobin 13.0 - 17.0 g/dL 87.6  87.5  87.0   Hematocrit 39.0 - 52.0 % 38.8  39.3  40.1   Platelets 150 - 400 K/uL 239  213  278        Latest Ref Rng & Units 01/14/2025    7:36 AM 01/13/2025    8:01 AM 01/06/2025    6:29 AM  BMP  Glucose 70 - 99 mg/dL 896  897  898   BUN 6 - 20 mg/dL 11  11  18    Creatinine 0.61 - 1.24 mg/dL 9.34  9.31  9.26   Sodium 135 - 145 mmol/L 140   139  138   Potassium 3.5 - 5.1 mmol/L 4.2  4.2  4.4   Chloride 98 - 111 mmol/L 101  100  102   CO2 22 - 32 mmol/L 31  32  29   Calcium 8.9 - 10.3 mg/dL 8.7  8.6  8.7      Vitals:   01/16/25 0836 01/16/25 1604 01/17/25 0558 01/17/25 0735  BP: 118/81 114/75 100/80 122/83  Pulse: 83 80 81 89  Resp: 18 16 18 18   Temp: 98 F (36.7 C) 98.4 F (36.9 C) 97.7 F (36.5 C) 98 F (36.7 C)  TempSrc:    Oral  SpO2: 92% 91% 92% 92%  Weight:   68.6 kg   Height:        Author: Drue  ONEIDA Potter, MD 01/17/2025 2:19 PM  For on call review [email or web address]  "

## 2025-01-18 DIAGNOSIS — J9621 Acute and chronic respiratory failure with hypoxia: Secondary | ICD-10-CM | POA: Diagnosis not present

## 2025-01-18 DIAGNOSIS — J9622 Acute and chronic respiratory failure with hypercapnia: Secondary | ICD-10-CM | POA: Diagnosis not present

## 2025-01-18 NOTE — Plan of Care (Signed)

## 2025-01-18 NOTE — Progress Notes (Signed)
 " Progress Note   Patient: Corey House. FMW:[id number] DOB: 04/23/1966 DOA: 10/20/2024     90 DOS: the patient was seen and examined on 01/18/2025   Brief hospital course:   From HPI 59 y.o. year old male with past medical history of  tobacco use disorder reporting a 41 year pack per year history, smoking ~1 pack per day since he was 59 years old. He denies any additional medical history though he has not seen a medical provider in ~20 years. He presents to [facility] ED with shortness of breath that began ~ 9 months ago and worsened ~3 months ago. He reports today his dyspnea is not significantly different than it was 3 months ago. He endorses a chronic cough that has been present since childhood. Her ports the cough is currently, minimally productive with white phlegm. Additionally, he endorses bilateral lower extremity swelling that began in the last 2 days, per his report (R) > (L). On exam the swelling is equal bilaterally. He endorses fatigue and dyspnea that is worse with position changes such as bending over and when standing from lying. He denies chest pain, palpitations, or fever.   ED Course: On arrival to [facility] ED patient was noted to be afebrile temp 36.8C, BP 142/111, HR 88, RR 22, SPO2 100% on 15L non-rebreather. Patient now on 2L via nasal cannula with SPO2 96%. CXR obtained and shows non specific findings of increased interstitial markings. CTA PE obtained and shows no PE, dilated RV and reflux of contract into the IVC and hepatic vein consistent with (R) heart failure, chronic bronchitis, and emphysema. US  DVT (B)LE negative for DVT. Labs notable Negative COVID, flu, RSV,for BNP 284.2, BUN 24, troponin 41--> 38 with no report of chest pain.  Lactic acid 0.9.  Blood cultures drawn and in process.  He was given Lasix , DuoNebs, and Solu-Medrol .     Patient was treated in the hospital, condition has improved. See A/P belwo for problem based course. Currently just  pending for placement.   CXR for TB r/o abn. CT chest with concern for pneumonia. 01/10 pt reported no increased SOB or coughing but more fatigued for few days. Started augmentin  + azithro, advised increased IS use and increased ambulation to encourage ventilation. CT images reviewed, PNA areas of concern lower posterior lungs with questionable persistent atelectasis / hypoventilation that may increase risk for infection. PPD negative. On 1/12, pt reported feeling bad with more SOB, fatigue, and jitteriness when xanax  wearing off. Switched augmentin  to IV rocephin , give 1L fluids, duonebs scheduled, steroids, held anoro. Lung exam no major concerns. 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 Clinically improved. Antibiotic course finished. Medically ready pending placement. 1/15.  TOC hopeful we can arrange placement for the patient soon. 1/17.  TOC will have to find another place for him.     Assessment and Plan:   Acute on chronic respiratory failure with hypoxia and hypercapnia (HCC) Multiple attempts were made to wean the patient from oxygen during the hospital course.  Pulse ox drops with ambulation on room air (down to 84% on 1/13).   Currently on 2 L of oxygen TOC working on placement   Multifocal pneumonia Completed antibiotics 1/16.   Depression Seen by psychiatry this admission who recommended resumption of home meds which were titrated by hospitalist service as below: Increased Seroquel  to 50 mg at night on 12/18/2024.  On Xanax  and Lexapro .  Added Wellbutrin  on 12/22.  Patient asked to come off the Wellbutrin  on 1/5 secondary to feeling nauseous. Wellbutrin  discontinued.   COPD with acute exacerbation (HCC) Treated during the hospital course   Acute on chronic diastolic CHF (congestive heart failure) (HCC) EF 55 to 60%.  Off Lasix  at this time and stable   Nausea Resolved.   Patient believes this was secondary to Wellbutrin  which was discontinued.   Tobacco use disorder On NicoDerm patch   Headache Resolved.  Tylenol  as needed.  IV magnesium  given on 11/5.   Increased anion gap metabolic acidosis Resolved   Hypernatremia Resolved   Pulmonary nodules Will need to follow-up as outpatient.  Pulmonary referral placed.   Thrombocytosis Continue to watch intermittently   Protein-calorie malnutrition, severe Continue supplements   DVT prophylaxis: rivaroxaban  (XARELTO ) tablet 10 mg Start: 12/22/24 1700 rivaroxaban  (XARELTO ) tablet 10 mg  Xarelto    Code Status: Full Code   Disposition Plan: TBD Reason for continuing need for hospitalization: medically ready, homeless with new oxygen requirement, TOC team following      Examination:   Physical Exam Vitals and nursing note reviewed.  Constitutional:      Appearance: He is ill-appearing (chronically).     Comments: frail  HENT:     Head: Normocephalic and atraumatic.  Cardiovascular:     Rate and Rhythm: Normal rate and regular rhythm.     Pulses: Normal pulses.     Heart sounds: Normal heart sounds.  Pulmonary:     Effort: Pulmonary effort is normal.     Breath sounds: Normal breath sounds.  Abdominal:     General: Bowel sounds are normal.     Palpations: Abdomen is soft.     Subjective: Did not have any acute overnight complaints TOC continues to work on placement   Data Reviewed:    Latest Ref Rng & Units 01/14/2025    7:36 AM 01/13/2025    8:01 AM 01/06/2025    6:29 AM  CBC  WBC 4.0 - 10.5 K/uL 4.9  5.8  5.7   Hemoglobin 13.0 - 17.0 g/dL 87.6  87.5  87.0   Hematocrit 39.0 - 52.0 % 38.8  39.3  40.1   Platelets 150 - 400 K/uL 239  213  278        Latest Ref Rng & Units 01/14/2025    7:36 AM 01/13/2025    8:01 AM 01/06/2025    6:29 AM  BMP  Glucose 70 - 99 mg/dL 896  897  898   BUN 6 - 20 mg/dL 11  11  18    Creatinine 0.61 - 1.24 mg/dL 9.34  9.31  9.26   Sodium 135 - 145  mmol/L 140  139  138   Potassium 3.5 - 5.1 mmol/L 4.2  4.2  4.4   Chloride 98 - 111 mmol/L 101  100  102   CO2 22 - 32 mmol/L 31  32  29   Calcium 8.9 - 10.3 mg/dL 8.7  8.6  8.7      Vitals:   01/18/25 0410 01/18/25 0441 01/18/25 0752 01/18/25 1538  BP:  111/84 102/74 113/77  Pulse:  73 82 86  Resp:  18 17 17   Temp:  97.7 F (36.5 C) (!) 97.5 F (36.4 C) 98.5 F (36.9 C)  TempSrc:   Oral   SpO2:  95% 91% 90%  Weight: 67.3 kg     Height:        Author: Drue ONEIDA Potter, MD 01/18/2025  5:53 PM  For on call review [email or web address]  "

## 2025-01-19 DIAGNOSIS — J9622 Acute and chronic respiratory failure with hypercapnia: Secondary | ICD-10-CM | POA: Diagnosis not present

## 2025-01-19 DIAGNOSIS — J9621 Acute and chronic respiratory failure with hypoxia: Secondary | ICD-10-CM | POA: Diagnosis not present

## 2025-01-19 NOTE — Plan of Care (Signed)

## 2025-01-20 ENCOUNTER — Inpatient Hospital Stay: Payer: MEDICAID

## 2025-01-20 MED ORDER — FUROSEMIDE 10 MG/ML IJ SOLN
40.0000 mg | Freq: Once | INTRAMUSCULAR | Status: DC
  Filled 2025-01-20: qty 4

## 2025-01-20 MED ORDER — FUROSEMIDE 40 MG PO TABS
40.0000 mg | ORAL_TABLET | Freq: Every day | ORAL | Status: AC
  Administered 2025-01-20 – 2025-01-22 (×3): 40 mg via ORAL
  Filled 2025-01-20 (×3): qty 1

## 2025-01-20 NOTE — Progress Notes (Signed)
 " PROGRESS NOTE    Corey House.  FMW:[id number] DOB: [birth year] DOA: 10/20/2024 PCP: Patient, [name]    Brief Narrative:     From HPI 59 y.o. year old male with past medical history of  tobacco use disorder reporting a 41 year pack per year history, smoking ~1 pack per day since he was 59 years old. He denies any additional medical history though he has not seen a medical provider in ~20 years. He presents to [facility] ED with shortness of breath that began ~ 9 months ago and worsened ~3 months ago. He reports today his dyspnea is not significantly different than it was 3 months ago. He endorses a chronic cough that has been present since childhood.    ED Course: On arrival to [facility] ED patient was noted to be afebrile temp 36.8C, BP 142/111, HR 88, RR 22, SPO2 100% on 15L non-rebreather. Patient now on 2L via nasal cannula with SPO2 96%. CXR obtained and shows non specific findings of increased interstitial markings. CTA PE obtained and shows no PE, dilated RV and reflux of contract into the IVC and hepatic vein consistent with (R) heart failure, chronic bronchitis, and emphysema. US  DVT (B)LE negative for DVT. Labs notable Negative COVID, flu, RSV,for BNP 284.2, BUN 24, troponin 41--> 38 with no report of chest pain.  Lactic acid 0.9.  Blood cultures drawn and in process.  He was given Lasix , DuoNebs, and Solu-Medrol .     Patient was treated in the hospital, condition has improved. See A/P belwo for problem based course. Currently just pending for placement.      Assessment & Plan:   Principal Problem:   Acute on chronic respiratory failure with hypoxia and hypercapnia (HCC) Active Problems:   Multifocal pneumonia   Depression   COPD with acute exacerbation (HCC)   Acute on chronic diastolic CHF (congestive heart failure) (HCC)   Tobacco use disorder   Nausea   Headache   Increased anion gap metabolic acidosis   Hypernatremia   Protein-calorie  malnutrition, severe   Skin pustule   Emphysema lung (HCC)   Thrombocytosis   Pulmonary nodules  Acute on chronic respiratory failure with hypoxia and hypercapnia (HCC) Multiple attempts were made to wean the patient from oxygen during the hospital course.  Pulse ox drops with ambulation on room air (down to 84%) Currently on 2 L of oxygen Plan: Low-grade oxygen is apparently a barrier to any discharge planning.  Will give 40 of IV Lasix  today and attempt to wean to room air.  Repeat chest x-ray fairly consistent with prior film on 1/9.  Multifocal pneumonia Completed antibiotics 1/16.   Depression Seen by psychiatry this admission who recommended resumption of home meds which were titrated by hospitalist service as below: Increased Seroquel  to 50 mg at night on 12/18/2024.  On Xanax  and Lexapro .  Added Wellbutrin  on 12/22.  Patient asked to come off the Wellbutrin  on 1/5 secondary to feeling nauseous. Wellbutrin  discontinued.   COPD with acute exacerbation (HCC) Treated during the hospital course   Acute on chronic diastolic CHF (congestive heart failure) (HCC) EF 55 to 60%.  Lasix  challenge as above   Nausea Resolved.  Patient believes this was secondary to Wellbutrin  which was discontinued.   Tobacco use disorder On NicoDerm patch   Headache Resolved.  Tylenol  as needed.  IV magnesium  given on 11/5.   Increased anion gap metabolic acidosis Resolved   Hypernatremia Resolved   Pulmonary nodules Will  need to follow-up as outpatient.    Thrombocytosis Continue to watch intermittently   Protein-calorie malnutrition, severe Continue supplements   DVT prophylaxis: Xarelto  Code Status: Full Family Communication: None Disposition Plan: Status is: Inpatient Remains inpatient appropriate because: Unsafe discharge plan   Level of care: Telemetry  Consultants:  None  Procedures:  None  Antimicrobials: None   Subjective: Seen and examined.  Resting in bed.  No  complaints  Objective: Vitals:   01/19/25 2008 01/20/25 0353 01/20/25 0422 01/20/25 0823  BP: 98/69  102/71 106/79  Pulse: 71  71 68  Resp: 18  18 18   Temp: 98.4 F (36.9 C)  98.2 F (36.8 C) 97.7 F (36.5 C)  TempSrc: Oral  Oral Oral  SpO2: 91%  93% 93%  Weight:  66.8 kg    Height:        Intake/Output Summary (Last 24 hours) at 01/20/2025 1157 Last data filed at 01/19/2025 1700 Gross per 24 hour  Intake 0 ml  Output --  Net 0 ml   Filed Weights   01/18/25 0410 01/19/25 0500 01/20/25 0353  Weight: 67.3 kg 67.8 kg 66.8 kg    Examination:  General exam: Appears calm and comfortable  Respiratory system: Mild scattered crackles bilaterally.  Normal work of breathing.  2 L Cardiovascular system: S1-S2, RRR, no murmurs, no pedal edema Gastrointestinal system: Abdomen is nondistended, soft and nontender. No organomegaly or masses felt. Normal bowel sounds heard. Central nervous system: Alert and oriented. No focal neurological deficits. Extremities: Symmetric 5 x 5 power. Skin: No rashes, lesions or ulcers Psychiatry: Judgement and insight appear normal. Mood & affect appropriate.     Data Reviewed: I have personally reviewed following labs and imaging studies  CBC: Recent Labs  Lab 01/14/25 0736  WBC 4.9  NEUTROABS 2.9  HGB 12.3*  HCT 38.8*  MCV 91.9  PLT 239   Basic Metabolic Panel: Recent Labs  Lab 01/14/25 0736  NA 140  K 4.2  CL 101  CO2 31  GLUCOSE 103*  BUN 11  CREATININE 0.65  CALCIUM 8.7*   GFR: Estimated Creatinine Clearance: 85.3 mL/min (by C-G formula based on SCr of 0.65 mg/dL). Liver Function Tests: No results for input(s): AST, ALT, ALKPHOS, BILITOT, PROT, ALBUMIN in the last 168 hours. No results for input(s): LIPASE, AMYLASE in the last 168 hours. No results for input(s): AMMONIA in the last 168 hours. Coagulation Profile: No results for input(s): INR, PROTIME in the last 168 hours. Cardiac Enzymes: No results  for input(s): CKTOTAL, CKMB, CKMBINDEX, TROPONINI in the last 168 hours. BNP (last 3 results) Recent Labs    10/29/24 0608  PROBNP <50.0   HbA1C: No results for input(s): HGBA1C in the last 72 hours. CBG: No results for input(s): GLUCAP in the last 168 hours. Lipid Profile: No results for input(s): CHOL, HDL, LDLCALC, TRIG, CHOLHDL, LDLDIRECT in the last 72 hours. Thyroid Function Tests: No results for input(s): TSH, T4TOTAL, FREET4, T3FREE, THYROIDAB in the last 72 hours. Anemia Panel: No results for input(s): VITAMINB12, FOLATE, FERRITIN, TIBC, IRON, RETICCTPCT in the last 72 hours. Sepsis Labs: No results for input(s): PROCALCITON, LATICACIDVEN in the last 168 hours.  Recent Results (from the past 240 hours)  Respiratory (~20 pathogens) panel by PCR     Status: None   Collection Time: 01/12/25  3:45 PM   Specimen: Nasopharyngeal Swab; Respiratory  Result Value Ref Range Status   Adenovirus NOT DETECTED NOT DETECTED Final   Coronavirus 229E NOT DETECTED NOT  DETECTED Final    Comment: (NOTE) The Coronavirus on the Respiratory Panel, DOES NOT test for the novel  Coronavirus (2019 nCoV)    Coronavirus HKU1 NOT DETECTED NOT DETECTED Final   Coronavirus NL63 NOT DETECTED NOT DETECTED Final   Coronavirus OC43 NOT DETECTED NOT DETECTED Final   Metapneumovirus NOT DETECTED NOT DETECTED Final   Rhinovirus / Enterovirus NOT DETECTED NOT DETECTED Final   Influenza A NOT DETECTED NOT DETECTED Final   Influenza B NOT DETECTED NOT DETECTED Final   Parainfluenza Virus 1 NOT DETECTED NOT DETECTED Final   Parainfluenza Virus 2 NOT DETECTED NOT DETECTED Final   Parainfluenza Virus 3 NOT DETECTED NOT DETECTED Final   Parainfluenza Virus 4 NOT DETECTED NOT DETECTED Final   Respiratory Syncytial Virus NOT DETECTED NOT DETECTED Final   Bordetella pertussis NOT DETECTED NOT DETECTED Final   Bordetella Parapertussis NOT DETECTED NOT DETECTED  Final   Chlamydophila pneumoniae NOT DETECTED NOT DETECTED Final   Mycoplasma pneumoniae NOT DETECTED NOT DETECTED Final    Comment: Performed at [facility], [address] [place], [place] [address]         Radiology Studies: No results found.      Scheduled Meds:  escitalopram   20 mg Oral Daily   guaiFENesin   600 mg Oral BID   multivitamin with minerals  1 tablet Oral Daily   nicotine   14 mg Transdermal Daily   pantoprazole   40 mg Oral Daily   QUEtiapine   50 mg Oral QHS   rivaroxaban   10 mg Oral Q supper   Continuous Infusions:   LOS: 92 days      Calvin KATHEE Robson, MD [facility]   If 7PM-7AM, please contact night-coverage  01/20/2025, 11:57 AM   "

## 2025-01-20 NOTE — TOC Progression Note (Signed)
 Transition of Care (TOC) - Progression Note    Patient Details  Name: Corey House. MRN: [id number] Date of Birth: 1966/01/05  Transition of Care [facility]) CM/SW Contact  [name], RN Phone Number: 01/20/2025, 11:46 AM  Clinical Narrative:    Latest update from [employer] is waiting to hear back from the [facility] for approval, as per them last week it can take them up to 2 weeks to approve.  [employer] has nothing to do with the approval it comes from the state.    Expected Discharge Plan:  (patient is currently homeless, unsure of discharge location at this time) Barriers to Discharge: Financial Resources               Expected Discharge Plan and Services   Discharge Planning Services: CM Consult   Living arrangements for the past 2 months: Homeless Expected Discharge Date: 01/16/25                                     Social Drivers of Health (SDOH) Interventions SDOH Screenings   Food Insecurity: Food Insecurity Present (10/20/2024)  Housing: High Risk (10/21/2024)  Transportation Needs: Unmet Transportation Needs (10/20/2024)  Utilities: At Risk (10/20/2024)  Alcohol  Screen: Low Risk (04/23/2024)  Social Connections: Unknown (04/23/2024)  Tobacco Use: High Risk (10/21/2024)    Readmission Risk Interventions     No data to display

## 2025-01-20 NOTE — Plan of Care (Signed)
  Problem: Health Behavior/Discharge Planning: Goal: Ability to manage health-related needs will improve Outcome: Progressing   Problem: Clinical Measurements: Goal: Ability to maintain clinical measurements within normal limits will improve Outcome: Progressing   Problem: Nutrition: Goal: Adequate nutrition will be maintained Outcome: Progressing   

## 2025-01-20 NOTE — Progress Notes (Signed)
 Nutrition Brief Note  Wt Readings from Last 15 Encounters:  01/20/25 66.8 kg  06/20/24 51.1 kg  04/22/24 49.9 kg  01/03/23 54.4 kg  10/04/21 51.7 kg  08/09/21 52.2 kg  03/19/21 59 kg  04/16/20 59 kg  03/06/20 59 kg  11/14/19 59 kg  06/29/18 68 kg  06/14/18 68 kg  05/04/18 74.8 kg  03/22/18 72.6 kg  01/30/17 72.6 kg   DOCUMENTATION CODES:    Severe malnutrition in context of social or environmental circumstances   INTERVENTION:    -Continue 2 gram sodium diet -Continue MVI with minerals daily -Continue Magic cup TID with meals, each supplement provides 290 kcal and 9 grams of protein  -RD will sign off secondary to medical stability; if further nutrition-related needs arise, please re-consult RD    NUTRITION DIAGNOSIS:    Severe Malnutrition related to social / environmental circumstances as evidenced by moderate fat depletion, severe fat depletion, moderate muscle depletion, severe muscle depletion, edema.   Ongoing   GOAL:    Patient will meet greater than or equal to 90% of their needs   Progressing   Weight have ranged from 62-66 kg over the past month.    He is medically stable for discharge, but remains in the hospital secondary to lack of safe discharge plan.   Current diet order is 2 grams sodium, patient is consuming approximately 0-100 (averaging 60-100% of meals)% of meals at this time. Labs and medications reviewed.   No nutrition interventions warranted at this time. If nutrition issues arise, please consult RD.   Margery ORN, RD, LDN, CDCES Registered Dietitian III Certified Diabetes Care and Education Specialist If unable to reach this RD, please use RD Inpatient group chat on secure chat between hours of 8am-4 pm daily

## 2025-01-21 NOTE — Progress Notes (Signed)
 " PROGRESS NOTE    Corey House.  FMW:[id number] DOB: 1966-03-09 DOA: 10/20/2024 PCP: Patient, [name]    Brief Narrative:     From HPI 59 y.o. year old male with past medical history of  tobacco use disorder reporting a 41 year pack per year history, smoking ~1 pack per day since he was 59 years old. He denies any additional medical history though he has not seen a medical provider in ~20 years. He presents to [facility] ED with shortness of breath that began ~ 9 months ago and worsened ~3 months ago. He reports today his dyspnea is not significantly different than it was 3 months ago. He endorses a chronic cough that has been present since childhood.    ED Course: On arrival to [facility] ED patient was noted to be afebrile temp 36.8C, BP 142/111, HR 88, RR 22, SPO2 100% on 15L non-rebreather. Patient now on 2L via nasal cannula with SPO2 96%. CXR obtained and shows non specific findings of increased interstitial markings. CTA PE obtained and shows no PE, dilated RV and reflux of contract into the IVC and hepatic vein consistent with (R) heart failure, chronic bronchitis, and emphysema. US  DVT (B)LE negative for DVT. Labs notable Negative COVID, flu, RSV,for BNP 284.2, BUN 24, troponin 41--> 38 with no report of chest pain.  Lactic acid 0.9.  Blood cultures drawn and in process.  He was given Lasix , DuoNebs, and Solu-Medrol .     Patient was treated in the hospital, condition has improved. See A/P belwo for problem based course. Currently just pending for placement.      Assessment & Plan:   Principal Problem:   Acute on chronic respiratory failure with hypoxia and hypercapnia (HCC) Active Problems:   Multifocal pneumonia   Depression   COPD with acute exacerbation (HCC)   Acute on chronic diastolic CHF (congestive heart failure) (HCC)   Tobacco use disorder   Nausea   Headache   Increased anion gap metabolic acidosis   Hypernatremia   Protein-calorie  malnutrition, severe   Skin pustule   Emphysema lung (HCC)   Thrombocytosis   Pulmonary nodules  Acute on chronic respiratory failure with hypoxia and hypercapnia (HCC) Multiple attempts were made to wean the patient from oxygen during the hospital course.  Pulse ox drops with ambulation on room air (down to 84%) Currently on 2 L of oxygen Plan: Low-grade oxygen is apparently a barrier to any discharge planning.  Started on p.o. Lasix  yesterday.  Will continue 40 mg p.o. daily.  Wean oxygen as tolerated.  Goal saturation 88%  Multifocal pneumonia Completed antibiotics 1/16.   Depression Seen by psychiatry this admission who recommended resumption of home meds which were titrated by hospitalist service as below: Increased Seroquel  to 50 mg at night on 12/18/2024.  On Xanax  and Lexapro .  Added Wellbutrin  on 12/22.  Patient asked to come off the Wellbutrin  on 1/5 secondary to feeling nauseous. Wellbutrin  discontinued.   COPD with acute exacerbation (HCC) Treated during the hospital course   Acute on chronic diastolic CHF (congestive heart failure) (HCC) EF 55 to 60%.  Lasix  resumed as above   Nausea Resolved.  Patient believes this was secondary to Wellbutrin  which was discontinued.   Tobacco use disorder On NicoDerm patch   Headache Resolved.  Tylenol  as needed.  IV magnesium  given on 11/5.   Increased anion gap metabolic acidosis Resolved   Hypernatremia Resolved   Pulmonary nodules Will need to follow-up as  outpatient.    Thrombocytosis Continue to watch intermittently   Protein-calorie malnutrition, severe Continue supplements   DVT prophylaxis: Xarelto  Code Status: Full Family Communication: None Disposition Plan: Status is: Inpatient Remains inpatient appropriate because: Unsafe discharge plan   Level of care: Telemetry  Consultants:  None  Procedures:  None  Antimicrobials: None   Subjective: Seen and examined.  Resting in bed.  Appears  comfortable.  No complaints  Objective: Vitals:   01/20/25 1607 01/20/25 1952 01/21/25 0456 01/21/25 0832  BP: 118/76 116/81  95/70  Pulse: 68 71  84  Resp: 17 18  18   Temp: 98.1 F (36.7 C) 98.1 F (36.7 C)  98 F (36.7 C)  TempSrc: Oral     SpO2: 93% 93%  (!) 89%  Weight:   66.5 kg   Height:        Intake/Output Summary (Last 24 hours) at 01/21/2025 1143 Last data filed at 01/20/2025 1900 Gross per 24 hour  Intake 480 ml  Output --  Net 480 ml   Filed Weights   01/19/25 0500 01/20/25 0353 01/21/25 0456  Weight: 67.8 kg 66.8 kg 66.5 kg    Examination:  General exam: NAD Respiratory system: Bibasilar crackles.  Normal work of breathing.  2 L Cardiovascular system: S1-S2, RRR, no murmurs, no pedal edema Gastrointestinal system: Abdomen is nondistended, soft and nontender. No organomegaly or masses felt. Normal bowel sounds heard. Central nervous system: Alert and oriented. No focal neurological deficits. Extremities: Symmetric 5 x 5 power. Skin: No rashes, lesions or ulcers Psychiatry: Judgement and insight appear normal. Mood & affect appropriate.     Data Reviewed: I have personally reviewed following labs and imaging studies  CBC: No results for input(s): WBC, NEUTROABS, HGB, HCT, MCV, PLT in the last 168 hours.  Basic Metabolic Panel: No results for input(s): NA, K, CL, CO2, GLUCOSE, BUN, CREATININE, CALCIUM, MG, PHOS in the last 168 hours.  GFR: Estimated Creatinine Clearance: 85.3 mL/min (by C-G formula based on SCr of 0.65 mg/dL). Liver Function Tests: No results for input(s): AST, ALT, ALKPHOS, BILITOT, PROT, ALBUMIN in the last 168 hours. No results for input(s): LIPASE, AMYLASE in the last 168 hours. No results for input(s): AMMONIA in the last 168 hours. Coagulation Profile: No results for input(s): INR, PROTIME in the last 168 hours. Cardiac Enzymes: No results for input(s): CKTOTAL, CKMB,  CKMBINDEX, TROPONINI in the last 168 hours. BNP (last 3 results) Recent Labs    10/29/24 0608  PROBNP <50.0   HbA1C: No results for input(s): HGBA1C in the last 72 hours. CBG: No results for input(s): GLUCAP in the last 168 hours. Lipid Profile: No results for input(s): CHOL, HDL, LDLCALC, TRIG, CHOLHDL, LDLDIRECT in the last 72 hours. Thyroid Function Tests: No results for input(s): TSH, T4TOTAL, FREET4, T3FREE, THYROIDAB in the last 72 hours. Anemia Panel: No results for input(s): VITAMINB12, FOLATE, FERRITIN, TIBC, IRON, RETICCTPCT in the last 72 hours. Sepsis Labs: No results for input(s): PROCALCITON, LATICACIDVEN in the last 168 hours.  Recent Results (from the past 240 hours)  Respiratory (~20 pathogens) panel by PCR     Status: None   Collection Time: 01/12/25  3:45 PM   Specimen: Nasopharyngeal Swab; Respiratory  Result Value Ref Range Status   Adenovirus NOT DETECTED NOT DETECTED Final   Coronavirus 229E NOT DETECTED NOT DETECTED Final    Comment: (NOTE) The Coronavirus on the Respiratory Panel, DOES NOT test for the novel  Coronavirus (2019 nCoV)    Coronavirus HKU1  NOT DETECTED NOT DETECTED Final   Coronavirus NL63 NOT DETECTED NOT DETECTED Final   Coronavirus OC43 NOT DETECTED NOT DETECTED Final   Metapneumovirus NOT DETECTED NOT DETECTED Final   Rhinovirus / Enterovirus NOT DETECTED NOT DETECTED Final   Influenza A NOT DETECTED NOT DETECTED Final   Influenza B NOT DETECTED NOT DETECTED Final   Parainfluenza Virus 1 NOT DETECTED NOT DETECTED Final   Parainfluenza Virus 2 NOT DETECTED NOT DETECTED Final   Parainfluenza Virus 3 NOT DETECTED NOT DETECTED Final   Parainfluenza Virus 4 NOT DETECTED NOT DETECTED Final   Respiratory Syncytial Virus NOT DETECTED NOT DETECTED Final   Bordetella pertussis NOT DETECTED NOT DETECTED Final   Bordetella Parapertussis NOT DETECTED NOT DETECTED Final   Chlamydophila pneumoniae  NOT DETECTED NOT DETECTED Final   Mycoplasma pneumoniae NOT DETECTED NOT DETECTED Final    Comment: Performed at [facility], [address] [place], [place] [address]         Radiology Studies: DG Chest Port 1 View Result Date: 01/20/2025 EXAM: 1 VIEW(S) XRAY OF THE CHEST 01/20/2025 09:36:00 AM COMPARISON: 12/24/2024 CLINICAL HISTORY: Hypoxia. FINDINGS: LUNGS AND PLEURA: Emphysema. Prominent interstitial markings bilaterally, particularly perihilar right greater than left, and left retrocardiac as before. Improved airspace opacity in right mid lung. No pleural effusion. No pneumothorax. HEART AND MEDIASTINUM: No acute abnormality of the cardiac and mediastinal silhouettes. BONES AND SOFT TISSUES: Old healed left clavicle fracture. No acute osseous abnormality. IMPRESSION: 1. Emphysema with chronic increased bilateral interstitial markings, greatest in the right perihilar region and left retrocardiac region. 2. Improved right midlung airspace opacity. Electronically signed by: Dayne Hassell MD 01/20/2025 01:27 PM EST RP Workstation: [id number]        Scheduled Meds:  escitalopram   20 mg Oral Daily   furosemide   40 mg Oral Daily   guaiFENesin   600 mg Oral BID   multivitamin with minerals  1 tablet Oral Daily   nicotine   14 mg Transdermal Daily   pantoprazole   40 mg Oral Daily   QUEtiapine   50 mg Oral QHS   rivaroxaban   10 mg Oral Q supper   Continuous Infusions:   LOS: 93 days      Corey KATHEE Robson, MD [facility]   If 7PM-7AM, please contact night-coverage  01/21/2025, 11:43 AM   "

## 2025-01-22 LAB — MAGNESIUM: Magnesium: 2.2 mg/dL (ref 1.7–2.4)

## 2025-01-22 LAB — BASIC METABOLIC PANEL WITH GFR
Anion gap: 9 (ref 5–15)
BUN: 18 mg/dL (ref 6–20)
CO2: 34 mmol/L — ABNORMAL HIGH (ref 22–32)
Calcium: 9.3 mg/dL (ref 8.9–10.3)
Chloride: 100 mmol/L (ref 98–111)
Creatinine, Ser: 0.86 mg/dL (ref 0.61–1.24)
GFR, Estimated: >60 mL/min
Glucose, Bld: 99 mg/dL (ref 70–99)
Potassium: 3.9 mmol/L (ref 3.5–5.1)
Sodium: 143 mmol/L (ref 135–145)

## 2025-01-22 MED ORDER — FLUTICASONE FUROATE-VILANTEROL 100-25 MCG/ACT IN AEPB
1.0000 | INHALATION_SPRAY | Freq: Every day | RESPIRATORY_TRACT | Status: AC
  Administered 2025-01-22: 1 via RESPIRATORY_TRACT
  Filled 2025-01-22: qty 28

## 2025-01-22 NOTE — Progress Notes (Signed)
 " PROGRESS NOTE    Corey House.  FMW:[id number] DOB: 02-Aug-1966 DOA: 10/20/2024 PCP: Patient, [name]    Brief Narrative:     From HPI 59 y.o. year old male with past medical history of  tobacco use disorder reporting a 41 year pack per year history, smoking ~1 pack per day since he was 59 years old. He denies any additional medical history though he has not seen a medical provider in ~20 years. He presents to [facility] ED with shortness of breath that began ~ 9 months ago and worsened ~3 months ago. He reports today his dyspnea is not significantly different than it was 3 months ago. He endorses a chronic cough that has been present since childhood.    ED Course: On arrival to [facility] ED patient was noted to be afebrile temp 36.8C, BP 142/111, HR 88, RR 22, SPO2 100% on 15L non-rebreather. Patient now on 2L via nasal cannula with SPO2 96%. CXR obtained and shows non specific findings of increased interstitial markings. CTA PE obtained and shows no PE, dilated RV and reflux of contract into the IVC and hepatic vein consistent with (R) heart failure, chronic bronchitis, and emphysema. US  DVT (B)LE negative for DVT. Labs notable Negative COVID, flu, RSV,for BNP 284.2, BUN 24, troponin 41--> 38 with no report of chest pain.  Lactic acid 0.9.  Blood cultures drawn and in process.  He was given Lasix , DuoNebs, and Solu-Medrol .     Patient was treated in the hospital, condition has improved. See A/P belwo for problem based course. Currently just pending for placement.      Assessment & Plan:   Principal Problem:   Acute on chronic respiratory failure with hypoxia and hypercapnia (HCC) Active Problems:   Multifocal pneumonia   Depression   COPD with acute exacerbation (HCC)   Acute on chronic diastolic CHF (congestive heart failure) (HCC)   Tobacco use disorder   Nausea   Headache   Increased anion gap metabolic acidosis   Hypernatremia   Protein-calorie  malnutrition, severe   Skin pustule   Emphysema lung (HCC)   Thrombocytosis   Pulmonary nodules  Acute on chronic respiratory failure with hypoxia and hypercapnia (HCC) Multiple attempts were made to wean the patient from oxygen during the hospital course.  Pulse ox drops with ambulation on room air (down to 84%) Currently on 2 L of oxygen Plan: Low-grade oxygen is apparently a barrier to any discharge planning.  Started on p.o. Lasix  2/4.  Will continue 40 mg p.o. daily.  Add Breo LABA/ICS twice daily.  Wean oxygen as tolerated.  Goal saturation 88%  Multifocal pneumonia Completed antibiotics 1/16.   Depression Seen by psychiatry this admission who recommended resumption of home meds which were titrated by hospitalist service as below: Increased Seroquel  to 50 mg at night on 12/18/2024.  On Xanax  and Lexapro .  Added Wellbutrin  on 12/22.  Patient asked to come off the Wellbutrin  on 1/5 secondary to feeling nauseous. Wellbutrin  discontinued.   COPD with acute exacerbation (HCC) Treated during the hospital course   Acute on chronic diastolic CHF (congestive heart failure) (HCC) EF 55 to 60%.  Lasix  resumed as above   Nausea Resolved.  Patient believes this was secondary to Wellbutrin  which was discontinued.   Tobacco use disorder On NicoDerm patch   Headache Resolved.  Tylenol  as needed.  IV magnesium  given on 11/5.   Increased anion gap metabolic acidosis Resolved   Hypernatremia Resolved   Pulmonary  nodules Will need to follow-up as outpatient.    Thrombocytosis Continue to watch intermittently   Protein-calorie malnutrition, severe Continue supplements   DVT prophylaxis: Xarelto  Code Status: Full Family Communication: None Disposition Plan: Status is: Inpatient Remains inpatient appropriate because: Unsafe discharge plan   Level of care: Telemetry  Consultants:  None  Procedures:  None  Antimicrobials: None   Subjective: Seen and examined.  Resting  in bed.  Appears comfortable.  No complaints  Objective: Vitals:   01/21/25 1951 01/22/25 0807 01/22/25 0826 01/22/25 0828  BP: 101/73 101/75    Pulse: 71 65    Resp: 18 18    Temp: 97.6 F (36.4 C) 98.1 F (36.7 C)    TempSrc: Oral Oral    SpO2: 93% 95% (!) 86% 90%  Weight: 67.1 kg     Height:        Intake/Output Summary (Last 24 hours) at 01/22/2025 1140 Last data filed at 01/22/2025 0900 Gross per 24 hour  Intake 480 ml  Output --  Net 480 ml   Filed Weights   01/20/25 0353 01/21/25 0456 01/21/25 1951  Weight: 66.8 kg 66.5 kg 67.1 kg    Examination:  General exam: NAD Respiratory system: Scattered crackles.  Normal work of breathing.  1 L Cardiovascular system: S1-S2, RRR, no murmurs, no pedal edema Gastrointestinal system: Abdomen is nondistended, soft and nontender. No organomegaly or masses felt. Normal bowel sounds heard. Central nervous system: Alert and oriented. No focal neurological deficits. Extremities: Symmetric 5 x 5 power. Skin: No rashes, lesions or ulcers Psychiatry: Judgement and insight appear normal. Mood & affect appropriate.     Data Reviewed: I have personally reviewed following labs and imaging studies  CBC: No results for input(s): WBC, NEUTROABS, HGB, HCT, MCV, PLT in the last 168 hours.  Basic Metabolic Panel: Recent Labs  Lab 01/22/25 0834  NA 143  K 3.9  CL 100  CO2 34*  GLUCOSE 99  BUN 18  CREATININE 0.86  CALCIUM 9.3    GFR: Estimated Creatinine Clearance: 79.4 mL/min (by C-G formula based on SCr of 0.86 mg/dL). Liver Function Tests: No results for input(s): AST, ALT, ALKPHOS, BILITOT, PROT, ALBUMIN in the last 168 hours. No results for input(s): LIPASE, AMYLASE in the last 168 hours. No results for input(s): AMMONIA in the last 168 hours. Coagulation Profile: No results for input(s): INR, PROTIME in the last 168 hours. Cardiac Enzymes: No results for input(s): CKTOTAL, CKMB,  CKMBINDEX, TROPONINI in the last 168 hours. BNP (last 3 results) Recent Labs    10/29/24 0608  PROBNP <50.0   HbA1C: No results for input(s): HGBA1C in the last 72 hours. CBG: No results for input(s): GLUCAP in the last 168 hours. Lipid Profile: No results for input(s): CHOL, HDL, LDLCALC, TRIG, CHOLHDL, LDLDIRECT in the last 72 hours. Thyroid Function Tests: No results for input(s): TSH, T4TOTAL, FREET4, T3FREE, THYROIDAB in the last 72 hours. Anemia Panel: No results for input(s): VITAMINB12, FOLATE, FERRITIN, TIBC, IRON, RETICCTPCT in the last 72 hours. Sepsis Labs: No results for input(s): PROCALCITON, LATICACIDVEN in the last 168 hours.  Recent Results (from the past 240 hours)  Respiratory (~20 pathogens) panel by PCR     Status: None   Collection Time: 01/12/25  3:45 PM   Specimen: Nasopharyngeal Swab; Respiratory  Result Value Ref Range Status   Adenovirus NOT DETECTED NOT DETECTED Final   Coronavirus 229E NOT DETECTED NOT DETECTED Final    Comment: (NOTE) The Coronavirus on the Respiratory  Panel, DOES NOT test for the novel  Coronavirus (2019 nCoV)    Coronavirus HKU1 NOT DETECTED NOT DETECTED Final   Coronavirus NL63 NOT DETECTED NOT DETECTED Final   Coronavirus OC43 NOT DETECTED NOT DETECTED Final   Metapneumovirus NOT DETECTED NOT DETECTED Final   Rhinovirus / Enterovirus NOT DETECTED NOT DETECTED Final   Influenza A NOT DETECTED NOT DETECTED Final   Influenza B NOT DETECTED NOT DETECTED Final   Parainfluenza Virus 1 NOT DETECTED NOT DETECTED Final   Parainfluenza Virus 2 NOT DETECTED NOT DETECTED Final   Parainfluenza Virus 3 NOT DETECTED NOT DETECTED Final   Parainfluenza Virus 4 NOT DETECTED NOT DETECTED Final   Respiratory Syncytial Virus NOT DETECTED NOT DETECTED Final   Bordetella pertussis NOT DETECTED NOT DETECTED Final   Bordetella Parapertussis NOT DETECTED NOT DETECTED Final   Chlamydophila pneumoniae  NOT DETECTED NOT DETECTED Final   Mycoplasma pneumoniae NOT DETECTED NOT DETECTED Final    Comment: Performed at [facility], [address] [place], [place] [address]         Radiology Studies: No results found.       Scheduled Meds:  escitalopram   20 mg Oral Daily   fluticasone  furoate-vilanterol  1 puff Inhalation Daily   furosemide   40 mg Oral Daily   guaiFENesin   600 mg Oral BID   multivitamin with minerals  1 tablet Oral Daily   nicotine   14 mg Transdermal Daily   pantoprazole   40 mg Oral Daily   QUEtiapine   50 mg Oral QHS   rivaroxaban   10 mg Oral Q supper   Continuous Infusions:   LOS: 94 days      Corey KATHEE Robson, MD [facility]   If 7PM-7AM, please contact night-coverage  01/22/2025, 11:40 AM   "
